# Patient Record
Sex: Male | Born: 1965 | Race: White | Hispanic: No | Marital: Married | Smoking: Former smoker
Health system: Southern US, Community
[De-identification: ages and names within clinical notes are randomized; demographics above are authoritative.]

## PROBLEM LIST (undated history)

## (undated) ENCOUNTER — Emergency Department (HOSPITAL_COMMUNITY): Admission: EM | Payer: Self-pay

## (undated) DIAGNOSIS — G4730 Sleep apnea, unspecified: Secondary | ICD-10-CM

## (undated) DIAGNOSIS — H579 Unspecified disorder of eye and adnexa: Secondary | ICD-10-CM

## (undated) DIAGNOSIS — E13319 Other specified diabetes mellitus with unspecified diabetic retinopathy without macular edema: Secondary | ICD-10-CM

## (undated) DIAGNOSIS — N189 Chronic kidney disease, unspecified: Secondary | ICD-10-CM

## (undated) DIAGNOSIS — E7800 Pure hypercholesterolemia, unspecified: Secondary | ICD-10-CM

## (undated) DIAGNOSIS — H35039 Hypertensive retinopathy, unspecified eye: Secondary | ICD-10-CM

## (undated) DIAGNOSIS — H269 Unspecified cataract: Secondary | ICD-10-CM

## (undated) DIAGNOSIS — R011 Cardiac murmur, unspecified: Secondary | ICD-10-CM

## (undated) DIAGNOSIS — I10 Essential (primary) hypertension: Secondary | ICD-10-CM

## (undated) DIAGNOSIS — I639 Cerebral infarction, unspecified: Secondary | ICD-10-CM

## (undated) DIAGNOSIS — E119 Type 2 diabetes mellitus without complications: Secondary | ICD-10-CM

## (undated) HISTORY — DX: Unspecified disorder of eye and adnexa: H57.9

## (undated) HISTORY — DX: Chronic kidney disease, unspecified: N18.9

## (undated) HISTORY — DX: Pure hypercholesterolemia, unspecified: E78.00

## (undated) HISTORY — DX: Hypertensive retinopathy, unspecified eye: H35.039

## (undated) HISTORY — DX: Cerebral infarction, unspecified: I63.9

## (undated) HISTORY — DX: Cardiac murmur, unspecified: R01.1

## (undated) HISTORY — DX: Unspecified cataract: H26.9

## (undated) HISTORY — DX: Sleep apnea, unspecified: G47.30

---

## 1989-04-29 HISTORY — PX: EYE SURGERY: SHX253

## 1990-04-29 HISTORY — PX: EYE SURGERY: SHX253

## 1992-04-29 DIAGNOSIS — H269 Unspecified cataract: Secondary | ICD-10-CM

## 1992-04-29 HISTORY — PX: CATARACT EXTRACTION W/ INTRAOCULAR LENS IMPLANT: SHX1309

## 1992-04-29 HISTORY — PX: EYE SURGERY: SHX253

## 1992-04-29 HISTORY — PX: CATARACT EXTRACTION: SUR2

## 1992-04-29 HISTORY — DX: Unspecified cataract: H26.9

## 1994-04-29 DIAGNOSIS — R011 Cardiac murmur, unspecified: Secondary | ICD-10-CM

## 1994-04-29 HISTORY — DX: Cardiac murmur, unspecified: R01.1

## 2001-06-02 ENCOUNTER — Encounter: Admission: RE | Admit: 2001-06-02 | Discharge: 2001-08-31 | Payer: Self-pay | Admitting: Endocrinology

## 2010-05-29 ENCOUNTER — Ambulatory Visit
Admission: RE | Admit: 2010-05-29 | Discharge: 2010-05-29 | Payer: Self-pay | Source: Home / Self Care | Attending: Internal Medicine | Admitting: Internal Medicine

## 2010-05-29 DIAGNOSIS — E109 Type 1 diabetes mellitus without complications: Secondary | ICD-10-CM | POA: Insufficient documentation

## 2010-05-29 DIAGNOSIS — M722 Plantar fascial fibromatosis: Secondary | ICD-10-CM | POA: Insufficient documentation

## 2010-05-29 DIAGNOSIS — I10 Essential (primary) hypertension: Secondary | ICD-10-CM | POA: Insufficient documentation

## 2010-05-29 DIAGNOSIS — E785 Hyperlipidemia, unspecified: Secondary | ICD-10-CM | POA: Insufficient documentation

## 2010-06-06 NOTE — Assessment & Plan Note (Signed)
Summary: NEW TO EST/HEEL PAIN//PH   Vital Signs:  Patient profile:   45 year old male Height:      68 inches (172.72 cm) Weight:      178.50 pounds (81.14 kg) BMI:     27.24 Temp:     98.4 degrees F (36.89 degrees C) oral BP sitting:   160 / 84  (left arm) Cuff size:   regular  Vitals Entered By: Ernestene Mention CMA (May 29, 2010 3:03 PM) CC: NP est care/heel pain since Friday./kb Is Patient Diabetic? Yes Pain Assessment Patient in pain? yes     Location: heel Type: sharp Onset of pain  since friday   History of Present Illness: new patient left heel pain for a while, located at the base of the heel, worse with the first steps of the day and when he starts walking. pain decrease --but never goes away-- after he walks several steps.  He has diabetes, has not seen his endocrinologist in about a year.  ROS No swelling or discoloration of the feet No injury No calf edema or pain. Diet "not good"  Preventive Screening-Counseling & Management  Alcohol-Tobacco     Smoking Status: quit      Drug Use:  no.    Current Medications (verified): 1)  Toprol Xl 50 Mg Xr24h-Tab (Metoprolol Succinate) .Marland Kitchen.. 1 By Mouth Qd  Allergies (verified): No Known Drug Allergies  Past History:  Past Medical History: Diabetes mellitus, type I---- dx at age 45 --- Dr Lum Keas Hx of Diabetic Retinopathy Hyperlipidemia Hypertension  Past Surgical History: Cataract extraction--1994 eye--Diabetic retinopathy 1991  Family History: DM-- F, twin brother , brother  kidney transplant-- F MI-- F strokes-- twin brother  colon ca--no prostate ca--no  Social History: Occupation: works at [employer] in [employer] lives in [place] Married children x 3  Former Smoker---25 years ago Alcohol use-no Drug use-no Occupation:  employed Smoking Status:  quit Drug Use:  no  Physical Exam  General:  alert, well-developed, and well-nourished.   Lungs:  normal respiratory effort, no intercostal  retractions, no accessory muscle use, and normal breath sounds.   Heart:  normal rate, regular rhythm, and no murmur.   Pulses:  normal pedal pulses bilaterally  Extremities:  no pretibial edema bilaterally  slightly tender at the base of the left heel without deformities, redness or swelling  Diabetes Management Exam:    Foot Exam (with socks and/or shoes not present):       Sensory-Pinprick/Light touch:          Left medial foot (L-4): normal          Left dorsal foot (L-5): normal          Left lateral foot (S-1): normal          Right medial foot (L-4): normal          Right dorsal foot (L-5): normal          Right lateral foot (S-1): normal       Sensory-Monofilament:          Left foot: normal          Right foot: normal       Inspection:          Left foot: normal          Right foot: normal       Nails:          Left foot: normal  Right foot: normal   Impression & Recommendations:  Problem # 1:  PLANTAR FASCIITIS (ICD-728.71) moderate to severe plantar fasciitis past stretching discussed, recommend stretching  t.i.d. for 2 weeks then b.i.d. Ice the area with a cold bottle Heel  shoe  inserts also recommended Call in a couple of weeks if not better, ortho?  Problem # 2:  DIABETES MELLITUS, TYPE I (ICD-250.01) strongly recommend to see his endocrinologist  Complete Medication List: 1)  Toprol Xl 50 Mg Xr24h-tab (Metoprolol succinate) .Marland Kitchen.. 1 by mouth qd 2)  Insulin Pump  3)  Ace I  4)  Cholesterol Med   Patient Instructions: 1)  plantas fasciitis 2)  stretching, ice, heel support   Orders Added: 1)  New Patient Level II [id number]

## 2010-11-27 ENCOUNTER — Encounter: Payer: Self-pay | Admitting: Family Medicine

## 2011-10-07 ENCOUNTER — Encounter (INDEPENDENT_AMBULATORY_CARE_PROVIDER_SITE_OTHER): Payer: Self-pay | Admitting: Ophthalmology

## 2011-10-23 ENCOUNTER — Encounter (INDEPENDENT_AMBULATORY_CARE_PROVIDER_SITE_OTHER): Payer: BC Managed Care – PPO | Admitting: Ophthalmology

## 2011-10-23 DIAGNOSIS — H43819 Vitreous degeneration, unspecified eye: Secondary | ICD-10-CM

## 2011-10-23 DIAGNOSIS — H35039 Hypertensive retinopathy, unspecified eye: Secondary | ICD-10-CM

## 2011-10-23 DIAGNOSIS — I10 Essential (primary) hypertension: Secondary | ICD-10-CM

## 2011-10-23 DIAGNOSIS — E11359 Type 2 diabetes mellitus with proliferative diabetic retinopathy without macular edema: Secondary | ICD-10-CM

## 2011-10-23 DIAGNOSIS — H2510 Age-related nuclear cataract, unspecified eye: Secondary | ICD-10-CM

## 2011-10-23 DIAGNOSIS — E1039 Type 1 diabetes mellitus with other diabetic ophthalmic complication: Secondary | ICD-10-CM

## 2012-10-23 ENCOUNTER — Ambulatory Visit (INDEPENDENT_AMBULATORY_CARE_PROVIDER_SITE_OTHER): Payer: BC Managed Care – PPO | Admitting: Ophthalmology

## 2014-04-29 DIAGNOSIS — I639 Cerebral infarction, unspecified: Secondary | ICD-10-CM

## 2014-04-29 HISTORY — DX: Cerebral infarction, unspecified: I63.9

## 2014-07-27 ENCOUNTER — Encounter (HOSPITAL_COMMUNITY): Payer: Self-pay

## 2014-07-27 ENCOUNTER — Ambulatory Visit (HOSPITAL_COMMUNITY): Admit: 2014-07-27 | Payer: Self-pay | Admitting: Cardiovascular Disease

## 2014-07-27 ENCOUNTER — Inpatient Hospital Stay (HOSPITAL_COMMUNITY)
Admission: EM | Admit: 2014-07-27 | Discharge: 2014-08-03 | DRG: 065 | Disposition: A | Discharge status: Other Acute Inpatient Hospital | Payer: 59 | Attending: Internal Medicine | Admitting: Internal Medicine

## 2014-07-27 DIAGNOSIS — R112 Nausea with vomiting, unspecified: Secondary | ICD-10-CM | POA: Diagnosis present

## 2014-07-27 DIAGNOSIS — Z87891 Personal history of nicotine dependence: Secondary | ICD-10-CM

## 2014-07-27 DIAGNOSIS — I10 Essential (primary) hypertension: Secondary | ICD-10-CM | POA: Diagnosis present

## 2014-07-27 DIAGNOSIS — Z794 Long term (current) use of insulin: Secondary | ICD-10-CM

## 2014-07-27 DIAGNOSIS — R42 Dizziness and giddiness: Secondary | ICD-10-CM | POA: Diagnosis not present

## 2014-07-27 DIAGNOSIS — N179 Acute kidney failure, unspecified: Secondary | ICD-10-CM | POA: Diagnosis present

## 2014-07-27 DIAGNOSIS — D72829 Elevated white blood cell count, unspecified: Secondary | ICD-10-CM

## 2014-07-27 DIAGNOSIS — IMO0001 Reserved for inherently not codable concepts without codable children: Secondary | ICD-10-CM | POA: Insufficient documentation

## 2014-07-27 DIAGNOSIS — I6330 Cerebral infarction due to thrombosis of unspecified cerebral artery: Secondary | ICD-10-CM

## 2014-07-27 DIAGNOSIS — E1065 Type 1 diabetes mellitus with hyperglycemia: Secondary | ICD-10-CM | POA: Diagnosis present

## 2014-07-27 DIAGNOSIS — R1110 Vomiting, unspecified: Secondary | ICD-10-CM | POA: Diagnosis not present

## 2014-07-27 DIAGNOSIS — E860 Dehydration: Secondary | ICD-10-CM | POA: Diagnosis present

## 2014-07-27 DIAGNOSIS — Z9641 Presence of insulin pump (external) (internal): Secondary | ICD-10-CM | POA: Diagnosis present

## 2014-07-27 DIAGNOSIS — I639 Cerebral infarction, unspecified: Secondary | ICD-10-CM | POA: Diagnosis not present

## 2014-07-27 DIAGNOSIS — Z823 Family history of stroke: Secondary | ICD-10-CM

## 2014-07-27 DIAGNOSIS — E109 Type 1 diabetes mellitus without complications: Secondary | ICD-10-CM

## 2014-07-27 DIAGNOSIS — E785 Hyperlipidemia, unspecified: Secondary | ICD-10-CM | POA: Diagnosis present

## 2014-07-27 DIAGNOSIS — E119 Type 2 diabetes mellitus without complications: Secondary | ICD-10-CM

## 2014-07-27 DIAGNOSIS — R7989 Other specified abnormal findings of blood chemistry: Secondary | ICD-10-CM

## 2014-07-27 HISTORY — DX: Other specified diabetes mellitus with unspecified diabetic retinopathy without macular edema: E13.319

## 2014-07-27 HISTORY — DX: Type 2 diabetes mellitus without complications: E11.9

## 2014-07-27 HISTORY — DX: Essential (primary) hypertension: I10

## 2014-07-27 LAB — URINALYSIS, ROUTINE W REFLEX MICROSCOPIC
BILIRUBIN URINE: NEGATIVE
GLUCOSE, UA: 250 mg/dL — AB
Hgb urine dipstick: NEGATIVE
KETONES UR: NEGATIVE mg/dL
LEUKOCYTES UA: NEGATIVE
NITRITE: NEGATIVE
PROTEIN: 100 mg/dL — AB
SPECIFIC GRAVITY, URINE: 1.01 (ref 1.005–1.030)
Urobilinogen, UA: 0.2 mg/dL (ref 0.0–1.0)
pH: 7.5 (ref 5.0–8.0)

## 2014-07-27 LAB — CBC
HEMATOCRIT: 43.3 % (ref 39.0–52.0)
HEMOGLOBIN: 15.2 g/dL (ref 13.0–17.0)
MCH: 29.8 pg (ref 26.0–34.0)
MCHC: 35.1 g/dL (ref 30.0–36.0)
MCV: 84.9 fL (ref 78.0–100.0)
PLATELETS: 124 10*3/uL — AB (ref 150–400)
RBC: 5.1 MIL/uL (ref 4.22–5.81)
RDW: 12.5 % (ref 11.5–15.5)
WBC: 13.3 10*3/uL — ABNORMAL HIGH (ref 4.0–10.5)

## 2014-07-27 LAB — BASIC METABOLIC PANEL
ANION GAP: 10 (ref 5–15)
BUN: 24 mg/dL — ABNORMAL HIGH (ref 6–23)
CHLORIDE: 108 mmol/L (ref 96–112)
CO2: 24 mmol/L (ref 19–32)
Calcium: 9.1 mg/dL (ref 8.4–10.5)
Creatinine, Ser: 1.58 mg/dL — ABNORMAL HIGH (ref 0.50–1.35)
GFR calc Af Amer: 58 mL/min — ABNORMAL LOW (ref >90)
GFR, EST NON AFRICAN AMERICAN: 50 mL/min — AB (ref >90)
GLUCOSE: 187 mg/dL — AB (ref 70–99)
Potassium: 3.9 mmol/L (ref 3.5–5.1)
Sodium: 142 mmol/L (ref 135–145)

## 2014-07-27 LAB — LIPASE, BLOOD: Lipase: 22 U/L (ref 11–59)

## 2014-07-27 LAB — URINE MICROSCOPIC-ADD ON

## 2014-07-27 SURGERY — LEFT HEART CATH
Anesthesia: LOCAL

## 2014-07-27 MED ORDER — OMEGA-3-ACID ETHYL ESTERS 1 G PO CAPS
1.0000 g | ORAL_CAPSULE | Freq: Two times a day (BID) | ORAL | Status: DC
  Administered 2014-07-28 – 2014-08-03 (×5): 1 g via ORAL
  Filled 2014-07-27 (×15): qty 1

## 2014-07-27 MED ORDER — SIMVASTATIN 40 MG PO TABS
40.0000 mg | ORAL_TABLET | Freq: Every day | ORAL | Status: DC
  Administered 2014-07-28 – 2014-07-31 (×4): 40 mg via ORAL
  Filled 2014-07-27 (×4): qty 1

## 2014-07-27 MED ORDER — SODIUM CHLORIDE 0.9 % IV BOLUS (SEPSIS)
1000.0000 mL | Freq: Once | INTRAVENOUS | Status: AC
  Administered 2014-07-27: 1000 mL via INTRAVENOUS

## 2014-07-27 MED ORDER — HYDRALAZINE HCL 20 MG/ML IJ SOLN
10.0000 mg | INTRAMUSCULAR | Status: AC
  Administered 2014-07-27: 10 mg via INTRAVENOUS
  Filled 2014-07-27: qty 1

## 2014-07-27 MED ORDER — ALUM & MAG HYDROXIDE-SIMETH 200-200-20 MG/5ML PO SUSP: 30.0000 mL | Freq: Four times a day (QID) | ORAL | Status: DC | PRN

## 2014-07-27 MED ORDER — RAMIPRIL 10 MG PO CAPS
10.0000 mg | ORAL_CAPSULE | Freq: Every day | ORAL | Status: DC
  Administered 2014-07-28 – 2014-07-29 (×2): 10 mg via ORAL
  Filled 2014-07-27 (×2): qty 1

## 2014-07-27 MED ORDER — ONDANSETRON HCL 4 MG/2ML IJ SOLN
4.0000 mg | Freq: Four times a day (QID) | INTRAMUSCULAR | Status: DC | PRN
  Administered 2014-07-29 – 2014-07-31 (×2): 4 mg via INTRAVENOUS
  Filled 2014-07-27 (×3): qty 2

## 2014-07-27 MED ORDER — SODIUM CHLORIDE 0.9 % IV SOLN
INTRAVENOUS | Status: AC
  Administered 2014-07-27: 22:00:00 via INTRAVENOUS

## 2014-07-27 MED ORDER — HYDROMORPHONE HCL 1 MG/ML IJ SOLN: 0.5000 mg | INTRAMUSCULAR | Status: DC | PRN

## 2014-07-27 MED ORDER — INSULIN GLARGINE 100 UNIT/ML ~~LOC~~ SOLN
5.0000 [IU] | Freq: Every day | SUBCUTANEOUS | Status: DC
Start: 2014-07-27 — End: 2014-07-28
  Administered 2014-07-27: 5 [IU] via SUBCUTANEOUS
  Filled 2014-07-27 (×2): qty 0.05

## 2014-07-27 MED ORDER — ENOXAPARIN SODIUM 40 MG/0.4ML ~~LOC~~ SOLN
40.0000 mg | SUBCUTANEOUS | Status: DC
  Administered 2014-07-28 – 2014-07-29 (×2): 40 mg via SUBCUTANEOUS
  Filled 2014-07-27 (×2): qty 0.4

## 2014-07-27 MED ORDER — ACETAMINOPHEN 650 MG RE SUPP: 650.0000 mg | Freq: Four times a day (QID) | RECTAL | Status: DC | PRN

## 2014-07-27 MED ORDER — PROMETHAZINE HCL 25 MG/ML IJ SOLN
25.0000 mg | Freq: Once | INTRAMUSCULAR | Status: AC
  Administered 2014-07-27: 25 mg via INTRAVENOUS
  Filled 2014-07-27: qty 1

## 2014-07-27 MED ORDER — ONDANSETRON HCL 4 MG PO TABS
4.0000 mg | ORAL_TABLET | Freq: Four times a day (QID) | ORAL | Status: DC | PRN
  Filled 2014-07-27: qty 1

## 2014-07-27 MED ORDER — METOPROLOL SUCCINATE ER 25 MG PO TB24
25.0000 mg | ORAL_TABLET | Freq: Every day | ORAL | Status: DC
  Administered 2014-07-28: 25 mg via ORAL
  Filled 2014-07-27: qty 1

## 2014-07-27 MED ORDER — METOCLOPRAMIDE HCL 5 MG/ML IJ SOLN
10.0000 mg | INTRAMUSCULAR | Status: AC
  Administered 2014-07-27: 10 mg via INTRAVENOUS
  Filled 2014-07-27: qty 2

## 2014-07-27 MED ORDER — SODIUM CHLORIDE 0.9 % IV SOLN
INTRAVENOUS | Status: DC
  Administered 2014-07-28 – 2014-07-29 (×5): via INTRAVENOUS

## 2014-07-27 MED ORDER — LORAZEPAM 2 MG/ML IJ SOLN
1.0000 mg | Freq: Once | INTRAMUSCULAR | Status: AC
  Administered 2014-07-27: 1 mg via INTRAVENOUS
  Filled 2014-07-27: qty 1

## 2014-07-27 MED ORDER — FAMOTIDINE IN NACL 20-0.9 MG/50ML-% IV SOLN
20.0000 mg | Freq: Once | INTRAVENOUS | Status: AC
Start: 2014-07-27 — End: 2014-07-27
  Administered 2014-07-27: 20 mg via INTRAVENOUS
  Filled 2014-07-27: qty 50

## 2014-07-27 MED ORDER — SODIUM CHLORIDE 0.9 % IJ SOLN
3.0000 mL | Freq: Two times a day (BID) | INTRAMUSCULAR | Status: DC
  Administered 2014-07-27 – 2014-08-03 (×9): 3 mL via INTRAVENOUS

## 2014-07-27 MED ORDER — OXYCODONE HCL 5 MG PO TABS: 5.0000 mg | ORAL_TABLET | ORAL | Status: DC | PRN

## 2014-07-27 MED ORDER — INSULIN ASPART 100 UNIT/ML ~~LOC~~ SOLN
0.0000 [IU] | SUBCUTANEOUS | Status: DC
  Administered 2014-07-27: 3 [IU] via SUBCUTANEOUS
  Administered 2014-07-28: 6 [IU] via SUBCUTANEOUS
  Administered 2014-07-28: 3 [IU] via SUBCUTANEOUS
  Administered 2014-07-28: 5 [IU] via SUBCUTANEOUS
  Administered 2014-07-28: 7 [IU] via SUBCUTANEOUS
  Administered 2014-07-28 – 2014-07-29 (×3): 9 [IU] via SUBCUTANEOUS
  Administered 2014-07-29: 5 [IU] via SUBCUTANEOUS
  Administered 2014-07-29: 7 [IU] via SUBCUTANEOUS

## 2014-07-27 MED ORDER — ACETAMINOPHEN 325 MG PO TABS
650.0000 mg | ORAL_TABLET | Freq: Four times a day (QID) | ORAL | Status: DC | PRN
  Administered 2014-07-29 – 2014-07-30 (×2): 650 mg via ORAL
  Filled 2014-07-27 (×2): qty 2

## 2014-07-27 NOTE — H&P (Addendum)
[facility] Admission History and Physical       Adrian Neal DOB: 1965/12/19 DOA: 07/27/2014  Referring physician: EDP PCP: No primary care provider on file.  Specialists:   Chief Complaint: Dizziness  HPI: Adrian Neal is a 49 y.o. male with a history of Type I DM with an Insulin Pump, HTN, and Hyperlipidemia who presents to the ED with complaints of sudden onset of Dizziness with N+V .  He was at work when it occurred.   He was brought to the ED and evaluated and placed on Anti-Emetics with minimal relief of his symptoms.   He was referred for medical admission.      Review of Systems:  Constitutional: No Weight Loss, No Weight Gain, Night Sweats, Fevers, Chills, +Dizziness, Light Headedness, Fatigue, or Generalized Weakness HEENT: No Headaches, Difficulty Swallowing,Tooth/Dental Problems,Sore Throat,  No Sneezing, Rhinitis, Ear Ache, Nasal Congestion, or Post Nasal Drip,  Cardio-vascular:  No Chest pain, Orthopnea, PND, Edema in Lower Extremities, Anasarca, Dizziness, Palpitations  Resp: No Dyspnea, No DOE, No Productive Cough, No Non-Productive Cough, No Hemoptysis, No Wheezing.    GI: No Heartburn, Indigestion, Abdominal Pain, +Nausea, +Vomiting, Diarrhea, Constipation, Hematemesis, Hematochezia, Melena, Change in Bowel Habits,  Loss of Appetite  GU: No Dysuria, No Change in Color of Urine, No Urgency or Urinary Frequency, No Flank pain.  Musculoskeletal: No Joint Pain or Swelling, No Decreased Range of Motion, No Back Pain.  Neurologic: No Syncope, No Seizures, Muscle Weakness, Paresthesia, Vision Disturbance or Loss, No Diplopia, No Vertigo, No Difficulty Walking,  Skin: No Rash or Lesions. Psych: No Change in Mood or Affect, No Depression or Anxiety, No Memory loss, No Confusion, or Hallucinations   Past Medical History  Diagnosis Date  . Hypertension   . Diabetes mellitus without complication      Past Surgical History  Procedure  Laterality Date  . Eye surgery        Prior to Admission medications   Medication Sig Start Date End Date Taking? Authorizing Provider  HUMALOG 100 UNIT/ML injection Inject 60 Units into the skin daily.  07/05/14  Yes Historical Provider, MD  metoprolol succinate (TOPROL-XL) 25 MG 24 hr tablet Take 25 mg by mouth daily.  07/04/14  Yes Historical Provider, MD  naproxen sodium (ANAPROX) 220 MG tablet Take 220 mg by mouth 2 (two) times daily with a meal. For knee pain   Yes Historical Provider, MD  omega-3 acid ethyl esters (LOVAZA) 1 G capsule Take 1 g by mouth 2 (two) times daily.   Yes Historical Provider, MD  ramipril (ALTACE) 10 MG capsule Take 10 mg by mouth daily. 07/04/14  Yes Historical Provider, MD  simvastatin (ZOCOR) 40 MG tablet Take 40 mg by mouth daily. 07/04/14  Yes Historical Provider, MD     No Known Allergies    Social History:  reports that he quit smoking about 23 years ago. He does not have any smokeless tobacco history on file. He reports that he does not drink alcohol or use illicit drugs.     Family History:  Twin Brother and Daughter with Type I DM   Physical Exam:  GEN:  Pleasant Well Nourished and Well Developed  49 y.o. Caucasian male examined and in no acute distress; cooperative with exam Filed Vitals:   07/27/14 1900 07/27/14 1930 07/27/14 2000 07/27/14 2125  BP: 196/99 157/65 170/89 190/69  Pulse: 95 93 93   Temp:    98.5 F (36.9 C)  TempSrc:  Oral  Resp: 22 13 15 18   SpO2: 99% 98% 99% 99%   Blood pressure 190/69, pulse 93, temperature 98.5 F (36.9 C), temperature source Oral, resp. rate 18, SpO2 99 %. PSYCH: He is drowsy but alert and oriented x 4; does not appear anxious does not appear depressed; affect is normal HEENT: Normocephalic and Atraumatic, Mucous membranes pink; PERRLA; EOM intact; Fundi:  Benign;  No scleral icterus, Nares: Patent, Oropharynx: Clear, Fair Dentition,    Neck:  FROM, No Cervical Lymphadenopathy nor Thyromegaly or  Carotid Bruit; No JVD; Breasts:: Not examined CHEST WALL: No tenderness CHEST: Normal respiration, clear to auscultation bilaterally HEART: Regular rate and rhythm; no murmurs rubs or gallops BACK: No kyphosis or scoliosis; No CVA tenderness ABDOMEN: Positive Bowel Sounds, Soft Non-Tender, No Rebound or Guarding; No Masses, No Organomegaly.   Rectal Exam: Not done EXTREMITIES: No Cyanosis, Clubbing, or Edema; No Ulcerations. Genitalia: not examined PULSES: 2+ and symmetric SKIN: Normal hydration no rash or ulceration CNS:  Alert and Oriented x 4, No Focal Deficits Vascular: pulses palpable throughout    Labs on Admission:  Basic Metabolic Panel:  Recent Labs Lab 07/27/14 1638  NA 142  K 3.9  CL 108  CO2 24  GLUCOSE 187*  BUN 24*  CREATININE 1.58*  CALCIUM 9.1   Liver Function Tests: No results for input(s): AST, ALT, ALKPHOS, BILITOT, PROT, ALBUMIN in the last 168 hours.  Recent Labs Lab 07/27/14 1638  LIPASE 22   No results for input(s): AMMONIA in the last 168 hours. CBC:  Recent Labs Lab 07/27/14 1638  WBC 13.3*  HGB 15.2  HCT 43.3  MCV 84.9  PLT 124*   Cardiac Enzymes: No results for input(s): CKTOTAL, CKMB, CKMBINDEX, TROPONINI in the last 168 hours.  BNP (last 3 results) No results for input(s): BNP in the last 8760 hours.  ProBNP (last 3 results) No results for input(s): PROBNP in the last 8760 hours.  CBG: No results for input(s): GLUCAP in the last 168 hours.  Radiological Exams on Admission: No results found.   EKG: Independently reviewed. Normal Sinus Rhythm at 89   Assessment/Plan:   49 y.o. male with  Principal Problem:   1.   Vertigo   MRI of Brain ordered   PRN Anti-Emetics, and Meclizine   IVFs   Telemetry Monitoring   Active Problems:   2.   Intractable nausea and vomiting- due to #1   PRN Anti-Emetics     3.   Type 1 diabetes mellitus- has Insulin Pump Turned Off while Inpatient   Lantus 5 units SQ daily while  Insulin Pump Off, titrate PRN   SSI coverage PRN with CBG checks q 4 hrs      4.   Essential hypertension   PRN IV Hydralazine   Resume Metoprolol, and Ramipril in AM     5.   Hyperlipidemia     On Simvastatin Rx     6.   DVT Prophylaxis   Lovenox         Code Status:     FULL CODE      Family Communication:   Family at Bedside   Disposition Plan:   Observation Status        Time spent:  [facility] Pager [phone]   If [place] Please Contact the Day Rounding Team MD for [facility]  If 7PM-7AM, Please Contact Night-Floor Coverage  [email or web address] Password [email or web address] 07/27/2014, 10:18  PM     ADDENDUM:   Patient was seen and examined on 07/27/2014

## 2014-07-27 NOTE — ED Notes (Signed)
PA Anderson Malta at bedside.

## 2014-07-27 NOTE — ED Notes (Signed)
Pt. Was at work today when he had sudden onset of dizziness/lightheadedness with n/v. Denies CP/SOB. EMS noted some red tinge to the vomit. EMS concern over possible elevation in leads 2/3/4. EMS noted pt. Vagal to HR 40's with vomiting. HR NSR 60's at rest. Pt received 4 mg zofran and 324 asa.

## 2014-07-27 NOTE — ED Provider Notes (Signed)
Presents with sudden onset sensation of room spinning one hour ago. Symptoms accompanied by nausea and vomiting symptoms worse with changing position improved with remaining still no visual complaint no focal numbness or weakness. No chest pain no shortness of breath no other associated symptoms on exam patient is alert appears uncomfortable. Complaining of vertigo and nausea. Cranial nerves II through XII grossly intact. Moves all extremities well   Orlie Dakin, MD 07/27/14 [phone]

## 2014-07-27 NOTE — ED Notes (Signed)
Pt turned off insulin pump per admitting MD.

## 2014-07-27 NOTE — ED Notes (Signed)
Pt. Has insulin pump.

## 2014-07-27 NOTE — ED Provider Notes (Signed)
CSN: [id number]     Arrival date & time 07/27/14  1523 History   First MD Initiated Contact with Patient 07/27/14 1530     Chief Complaint  Patient presents with  . Emesis  . Dizziness     (Consider location/radiation/quality/duration/timing/severity/associated sxs/prior Treatment) HPI Comments: Patient is a 49 year old male past medical history significant for HTN, DM on insulin pump presenting to the emergency department with acute onset dizziness around 1 PM this afternoon. Patient states this is followed by nausea with multiple episodes of emesis. He endorses precipitating cramping epigastric pain but otherwise denies any abdominal pain, chest pain, shortness of breath, numbness or weakness to his extremities. States he has had a mild throbbing left-sided temporal headache over the last few days without associated visual disturbance. No recent travel outside the [place]. No recent antibiotic use. No known sick contacts.  Patient is a 49 y.o. male presenting with vomiting and dizziness.  Emesis Associated symptoms: no diarrhea   Dizziness Associated symptoms: nausea and vomiting   Associated symptoms: no chest pain, no diarrhea and no shortness of breath     Past Medical History  Diagnosis Date  . Hypertension   . Diabetes mellitus without complication    Past Surgical History  Procedure Laterality Date  . Eye surgery     No family history on file. History  Substance Use Topics  . Smoking status: Former Smoker    Quit date: 04/30/1991  . Smokeless tobacco: Not on file  . Alcohol Use: No    Review of Systems  Respiratory: Negative for shortness of breath.   Cardiovascular: Negative for chest pain.  Gastrointestinal: Positive for nausea and vomiting. Negative for diarrhea.  Neurological: Positive for dizziness.  All other systems reviewed and are negative.     Allergies  Review of patient's allergies indicates no known allergies.  Home Medications   Prior to  Admission medications   Medication Sig Start Date End Date Taking? Authorizing Provider  HUMALOG 100 UNIT/ML injection Inject 60 Units into the skin daily.  07/05/14  Yes Historical Provider, MD  metoprolol succinate (TOPROL-XL) 25 MG 24 hr tablet Take 25 mg by mouth daily.  07/04/14  Yes Historical Provider, MD  naproxen sodium (ANAPROX) 220 MG tablet Take 220 mg by mouth 2 (two) times daily with a meal. For knee pain   Yes Historical Provider, MD  omega-3 acid ethyl esters (LOVAZA) 1 G capsule Take 1 g by mouth 2 (two) times daily.   Yes Historical Provider, MD  ramipril (ALTACE) 10 MG capsule Take 10 mg by mouth daily. 07/04/14  Yes Historical Provider, MD  simvastatin (ZOCOR) 40 MG tablet Take 40 mg by mouth daily. 07/04/14  Yes Historical Provider, MD   BP 170/89 mmHg  Pulse 93  Temp(Src) 97.7 F (36.5 C) (Oral)  Resp 15  SpO2 99% Physical Exam  Constitutional: He is oriented to person, place, and time. He appears well-developed and well-nourished. No distress.  HENT:  Head: Normocephalic and atraumatic.  Right Ear: Hearing, tympanic membrane, external ear and ear canal normal.  Left Ear: Hearing, tympanic membrane, external ear and ear canal normal.  Nose: Nose normal.  Mouth/Throat: Oropharynx is clear and moist. No oropharyngeal exudate.  Eyes: Conjunctivae and EOM are normal. Pupils are equal, round, and reactive to light.  Neck: Normal range of motion. Neck supple.  Cardiovascular: Normal rate, regular rhythm, normal heart sounds and intact distal pulses.   Pulmonary/Chest: Effort normal and breath sounds normal. No respiratory distress.  Abdominal: Soft. There is no tenderness.  Patient with episodes of emesis during examination. Stomach contents streaked with blood.   Neurological: He is alert and oriented to person, place, and time. He has normal strength. No cranial nerve deficit. Gait normal. GCS eye subscore is 4. GCS verbal subscore is 5. GCS motor subscore is 6.  Sensation  grossly intact.  No pronator drift.  Bilateral heel-knee-shin intact.  Skin: Skin is warm and dry. He is not diaphoretic.  Nursing note and vitals reviewed.   ED Course  Procedures (including critical care time) Medications  0.9 %  sodium chloride infusion (not administered)  promethazine (PHENERGAN) injection 25 mg (25 mg Intravenous Given 07/27/14 1606)  LORazepam (ATIVAN) injection 1 mg (1 mg Intravenous Given 07/27/14 1606)  famotidine (PEPCID) IVPB 20 mg (0 mg Intravenous Stopped 07/27/14 1714)  sodium chloride 0.9 % bolus 1,000 mL (0 mLs Intravenous Stopped 07/27/14 1747)  sodium chloride 0.9 % bolus 1,000 mL (0 mLs Intravenous Stopped 07/27/14 1914)  metoCLOPramide (REGLAN) injection 10 mg (10 mg Intravenous Given 07/27/14 1924)  hydrALAZINE (APRESOLINE) injection 10 mg (10 mg Intravenous Given 07/27/14 2010)    Labs Review Labs Reviewed  BASIC METABOLIC PANEL - Abnormal; Notable for the following:    Glucose, Bld 187 (*)    BUN 24 (*)    Creatinine, Ser 1.58 (*)    GFR calc non Af Amer 50 (*)    GFR calc Af Amer 58 (*)    All other components within normal limits  CBC - Abnormal; Notable for the following:    WBC 13.3 (*)    Platelets 124 (*)    All other components within normal limits  URINALYSIS, ROUTINE W REFLEX MICROSCOPIC - Abnormal; Notable for the following:    Glucose, UA 250 (*)    Protein, ur 100 (*)    All other components within normal limits  LIPASE, BLOOD  URINE MICROSCOPIC-ADD ON  CBG MONITORING, ED    Imaging Review No results found.   EKG Interpretation   Date/Time:  Wednesday July 27 2014 15:40:31 EDT Ventricular Rate:  89 PR Interval:  158 QRS Duration: 82 QT Interval:  364 QTC Calculation: 442 R Axis:   60 Text Interpretation:  Sinus rhythm with marked sinus arrhythmia Left  ventricular hypertrophy with repolarization abnormality Abnormal ECG No  old tracing to compare Confirmed by [email or web address]  MD, SAM [phone]) on  07/27/2014 3:51:04 PM       5:38 PM On re-evaluation patient asleep, no further episodes of emesis per wife.   7:14PM Patient with recurrent nausea and vomiting.   MDM   Final diagnoses:  Intractable vomiting with nausea, vomiting of unspecified type  Vertigo    Filed Vitals:   07/27/14 2000  BP: 170/89  Pulse: 93  Temp:   Resp: 15   I have reviewed nursing notes, vital signs, and all appropriate lab and imaging results for this patient. No neurofocal deficits on examination. Patient with emesis during initial evaluation. No complaint of chest pain or shortness of breath. EKG reviewed. Labs reviewed, mild bump in creatinine likely secondary to dehydration. On reevaluation patient with return of nausea and vomiting, endorses mild dizziness but with fast improvement. Symptoms likely related to peripheral vertigo, low suspicion for central cause of vertigo. Patient is at 3 rounds of antiemetics with continued nausea and vomiting. Will admit for intractable nausea and vomiting.  Patient d/w with Dr. Winfred Leeds, agrees with plan.      Baron Sane, PA-C 07/27/14  2119  Orlie Dakin, MD 07/28/14 [id number]

## 2014-07-27 NOTE — Progress Notes (Signed)
Pt bp 192/66, hr 105. Paged MD, awaiting call back. Will continue to monitor.

## 2014-07-28 ENCOUNTER — Observation Stay (HOSPITAL_COMMUNITY): Payer: 59

## 2014-07-28 ENCOUNTER — Encounter (HOSPITAL_COMMUNITY): Payer: Self-pay | Admitting: Neurology

## 2014-07-28 DIAGNOSIS — N179 Acute kidney failure, unspecified: Secondary | ICD-10-CM

## 2014-07-28 DIAGNOSIS — I6339 Cerebral infarction due to thrombosis of other cerebral artery: Secondary | ICD-10-CM | POA: Diagnosis not present

## 2014-07-28 DIAGNOSIS — I69398 Other sequelae of cerebral infarction: Secondary | ICD-10-CM | POA: Diagnosis not present

## 2014-07-28 DIAGNOSIS — D72829 Elevated white blood cell count, unspecified: Secondary | ICD-10-CM | POA: Diagnosis not present

## 2014-07-28 DIAGNOSIS — R42 Dizziness and giddiness: Secondary | ICD-10-CM

## 2014-07-28 DIAGNOSIS — I638 Other cerebral infarction: Secondary | ICD-10-CM | POA: Diagnosis not present

## 2014-07-28 DIAGNOSIS — I6330 Cerebral infarction due to thrombosis of unspecified cerebral artery: Secondary | ICD-10-CM | POA: Diagnosis not present

## 2014-07-28 DIAGNOSIS — Z794 Long term (current) use of insulin: Secondary | ICD-10-CM

## 2014-07-28 DIAGNOSIS — E785 Hyperlipidemia, unspecified: Secondary | ICD-10-CM | POA: Diagnosis present

## 2014-07-28 DIAGNOSIS — E860 Dehydration: Secondary | ICD-10-CM | POA: Diagnosis present

## 2014-07-28 DIAGNOSIS — R270 Ataxia, unspecified: Secondary | ICD-10-CM | POA: Diagnosis not present

## 2014-07-28 DIAGNOSIS — I10 Essential (primary) hypertension: Secondary | ICD-10-CM | POA: Diagnosis present

## 2014-07-28 DIAGNOSIS — E119 Type 2 diabetes mellitus without complications: Secondary | ICD-10-CM

## 2014-07-28 DIAGNOSIS — Z823 Family history of stroke: Secondary | ICD-10-CM | POA: Diagnosis not present

## 2014-07-28 DIAGNOSIS — I639 Cerebral infarction, unspecified: Secondary | ICD-10-CM | POA: Diagnosis present

## 2014-07-28 DIAGNOSIS — Z87891 Personal history of nicotine dependence: Secondary | ICD-10-CM | POA: Diagnosis not present

## 2014-07-28 DIAGNOSIS — I6789 Other cerebrovascular disease: Secondary | ICD-10-CM | POA: Diagnosis not present

## 2014-07-28 DIAGNOSIS — R1110 Vomiting, unspecified: Secondary | ICD-10-CM | POA: Diagnosis not present

## 2014-07-28 DIAGNOSIS — E1065 Type 1 diabetes mellitus with hyperglycemia: Secondary | ICD-10-CM | POA: Diagnosis present

## 2014-07-28 DIAGNOSIS — Z9641 Presence of insulin pump (external) (internal): Secondary | ICD-10-CM | POA: Diagnosis present

## 2014-07-28 LAB — GLUCOSE, CAPILLARY
GLUCOSE-CAPILLARY: 316 mg/dL — AB (ref 70–99)
GLUCOSE-CAPILLARY: 447 mg/dL — AB (ref 70–99)
Glucose-Capillary: 210 mg/dL — ABNORMAL HIGH (ref 70–99)
Glucose-Capillary: 249 mg/dL — ABNORMAL HIGH (ref 70–99)
Glucose-Capillary: 273 mg/dL — ABNORMAL HIGH (ref 70–99)
Glucose-Capillary: 356 mg/dL — ABNORMAL HIGH (ref 70–99)
Glucose-Capillary: 453 mg/dL — ABNORMAL HIGH (ref 70–99)
Glucose-Capillary: 423 mg/dL — ABNORMAL HIGH (ref 70–99)

## 2014-07-28 LAB — BASIC METABOLIC PANEL
ANION GAP: 14 (ref 5–15)
BUN: 27 mg/dL — ABNORMAL HIGH (ref 6–23)
CHLORIDE: 109 mmol/L (ref 96–112)
CO2: 19 mmol/L (ref 19–32)
CREATININE: 2.1 mg/dL — AB (ref 0.50–1.35)
Calcium: 8.9 mg/dL (ref 8.4–10.5)
GFR calc non Af Amer: 36 mL/min — ABNORMAL LOW (ref >90)
GFR, EST AFRICAN AMERICAN: 41 mL/min — AB (ref >90)
Glucose, Bld: 319 mg/dL — ABNORMAL HIGH (ref 70–99)
Potassium: 3.6 mmol/L (ref 3.5–5.1)
Sodium: 142 mmol/L (ref 135–145)

## 2014-07-28 LAB — HEPATIC FUNCTION PANEL
ALBUMIN: 3.4 g/dL — AB (ref 3.5–5.2)
ALT: 24 U/L (ref 0–53)
AST: 20 U/L (ref 0–37)
Alkaline Phosphatase: 88 U/L (ref 39–117)
Bilirubin, Direct: 0.3 mg/dL (ref 0.0–0.5)
Indirect Bilirubin: 1.4 mg/dL — ABNORMAL HIGH (ref 0.3–0.9)
TOTAL PROTEIN: 6.1 g/dL (ref 6.0–8.3)
Total Bilirubin: 1.7 mg/dL — ABNORMAL HIGH (ref 0.3–1.2)

## 2014-07-28 LAB — CBC
HCT: 42.8 % (ref 39.0–52.0)
Hemoglobin: 13.8 g/dL (ref 13.0–17.0)
MCH: 28.3 pg (ref 26.0–34.0)
MCHC: 32.2 g/dL (ref 30.0–36.0)
MCV: 87.7 fL (ref 78.0–100.0)
Platelets: 134 10*3/uL — ABNORMAL LOW (ref 150–400)
RBC: 4.88 MIL/uL (ref 4.22–5.81)
RDW: 12.9 % (ref 11.5–15.5)
WBC: 14.2 10*3/uL — AB (ref 4.0–10.5)

## 2014-07-28 LAB — TSH: TSH: 2.756 u[IU]/mL (ref 0.350–4.500)

## 2014-07-28 LAB — LIPID PANEL
CHOLESTEROL: 134 mg/dL (ref 0–200)
HDL: 51 mg/dL (ref >39)
LDL Cholesterol: 70 mg/dL (ref 0–99)
Total CHOL/HDL Ratio: 2.6 RATIO
Triglycerides: 66 mg/dL (ref <150)
VLDL: 13 mg/dL (ref 0–40)

## 2014-07-28 MED ORDER — HYDRALAZINE HCL 20 MG/ML IJ SOLN: 10.0000 mg | Freq: Four times a day (QID) | INTRAMUSCULAR | Status: DC | PRN

## 2014-07-28 MED ORDER — PROMETHAZINE HCL 25 MG/ML IJ SOLN
12.5000 mg | Freq: Once | INTRAMUSCULAR | Status: AC
  Administered 2014-07-28: 12.5 mg via INTRAVENOUS
  Filled 2014-07-28: qty 1

## 2014-07-28 MED ORDER — INSULIN ASPART 100 UNIT/ML ~~LOC~~ SOLN
8.0000 [IU] | Freq: Once | SUBCUTANEOUS | Status: AC
  Administered 2014-07-28: 8 [IU] via SUBCUTANEOUS

## 2014-07-28 MED ORDER — STROKE: EARLY STAGES OF RECOVERY BOOK
Freq: Once | Status: AC
  Administered 2014-07-28: 14:00:00
  Filled 2014-07-28: qty 1

## 2014-07-28 MED ORDER — ASPIRIN EC 325 MG PO TBEC
325.0000 mg | DELAYED_RELEASE_TABLET | Freq: Every day | ORAL | Status: DC
  Administered 2014-07-28 – 2014-08-03 (×7): 325 mg via ORAL
  Filled 2014-07-28 (×7): qty 1

## 2014-07-28 MED ORDER — MECLIZINE HCL 25 MG PO TABS
25.0000 mg | ORAL_TABLET | Freq: Three times a day (TID) | ORAL | Status: DC | PRN
  Administered 2014-07-28: 25 mg via ORAL
  Filled 2014-07-28 (×2): qty 1

## 2014-07-28 MED ORDER — MECLIZINE HCL 25 MG PO TABS
25.0000 mg | ORAL_TABLET | Freq: Once | ORAL | Status: AC
Start: 2014-07-28 — End: 2014-07-28
  Administered 2014-07-28: 25 mg via ORAL
  Filled 2014-07-28: qty 1

## 2014-07-28 MED ORDER — METOCLOPRAMIDE HCL 5 MG/ML IJ SOLN
5.0000 mg | Freq: Three times a day (TID) | INTRAMUSCULAR | Status: AC
  Administered 2014-07-28 – 2014-07-29 (×3): 5 mg via INTRAVENOUS
  Filled 2014-07-28 (×3): qty 1

## 2014-07-28 MED ORDER — INSULIN GLARGINE 100 UNIT/ML ~~LOC~~ SOLN
20.0000 [IU] | Freq: Every day | SUBCUTANEOUS | Status: DC
  Administered 2014-07-28: 20 [IU] via SUBCUTANEOUS
  Filled 2014-07-28 (×2): qty 0.2

## 2014-07-28 MED ORDER — HYDRALAZINE HCL 20 MG/ML IJ SOLN
10.0000 mg | Freq: Once | INTRAMUSCULAR | Status: AC
  Administered 2014-07-28: 10 mg via INTRAVENOUS
  Filled 2014-07-28: qty 1

## 2014-07-28 MED ORDER — LIVING WELL WITH DIABETES BOOK
Freq: Once | Status: AC
  Administered 2014-07-28: 14:00:00
  Filled 2014-07-28: qty 1

## 2014-07-28 MED ORDER — SODIUM CHLORIDE 0.9 % IV BOLUS (SEPSIS)
500.0000 mL | Freq: Once | INTRAVENOUS | Status: AC
  Administered 2014-07-28: 500 mL via INTRAVENOUS

## 2014-07-28 MED ORDER — METOPROLOL TARTRATE 1 MG/ML IV SOLN
2.5000 mg | Freq: Three times a day (TID) | INTRAVENOUS | Status: DC
  Administered 2014-07-28 – 2014-07-29 (×3): 2.5 mg via INTRAVENOUS
  Filled 2014-07-28 (×6): qty 5

## 2014-07-28 NOTE — Progress Notes (Signed)
Made Dr. Rogue Bussing aware of pt cbg 447, awaiting orders to be placed. Will continue to monitor.

## 2014-07-28 NOTE — Progress Notes (Signed)
UR completed 

## 2014-07-28 NOTE — Progress Notes (Signed)
Made Dr. Hilbert Bible aware of pt bp 202/96, awaiting orders to be placed, will continue to monitor.

## 2014-07-28 NOTE — Progress Notes (Signed)
Pt CBG 423 Dr. Rogue Bussing aware, gave telephone orders to give 6units of novolog insulin. Will continue to monitor

## 2014-07-28 NOTE — Progress Notes (Signed)
*  PRELIMINARY RESULTS* Vascular Ultrasound Carotid Duplex (Doppler) has been completed.  Preliminary findings: Bilateral:  1-39% ICA stenosis.  Vertebral artery flow is antegrade.      Landry Mellow, RDMS, RVT  07/28/2014, 10:21 AM

## 2014-07-28 NOTE — Consult Note (Signed)
Referring Physician: Erlinda Hong    Chief Complaint: Stroke  HPI:                                                                                                                                         Adrian Neal is an 49 y.o. male with known HTN and DM.  Patient was at work yesterday when he suddenly felt dizzy and then became nauseated. He attempted to walk and sit down and noted he was falling to the left. He sat down in hopes it would pass but then noted the room was spinning to the left.  He called EMS and was brought to ED. Today he feels his symptoms of nausea have decreased and he is more stable on his feet.  He feels he has been under more stress at work and admits his A1c has been around 8.0.  Date last known well: Date: 07/27/2014 Time last known well: Time: 13:30 tPA Given: No: out of window Modified Rankin: Rankin Score=0    Past Medical History  Diagnosis Date  . Hypertension   . Diabetes mellitus without complication     Past Surgical History  Procedure Laterality Date  . Eye surgery      Family History  Problem Relation Age of Onset  . Stroke Mother   . Hypertension Mother   . Hyperlipidemia Mother   . Hyperlipidemia Father   . Hypertension Father    Social History:  reports that he quit smoking about 23 years ago. He does not have any smokeless tobacco history on file. He reports that he does not drink alcohol or use illicit drugs.  Allergies: No Known Allergies  Medications:                                                                                                                           Prior to Admission:  Prescriptions prior to admission  Medication Sig Dispense Refill Last Dose  . HUMALOG 100 UNIT/ML injection Inject 60 Units into the skin daily.    07/27/2014 at Unknown time  . metoprolol succinate (TOPROL-XL) 25 MG 24 hr tablet Take 25 mg by mouth daily.    07/27/2014 at 0800  . naproxen sodium (ANAPROX) 220 MG tablet Take 220 mg by mouth 2  (two) times daily with a meal. For knee pain   07/27/2014 at Unknown time  .  omega-3 acid ethyl esters (LOVAZA) 1 G capsule Take 1 g by mouth 2 (two) times daily.   07/27/2014 at Unknown time  . ramipril (ALTACE) 10 MG capsule Take 10 mg by mouth daily.   07/27/2014 at Unknown time  . simvastatin (ZOCOR) 40 MG tablet Take 40 mg by mouth daily.   07/27/2014 at Unknown time   Scheduled: . aspirin EC  325 mg Oral Daily  . enoxaparin (LOVENOX) injection  40 mg Subcutaneous Q24H  . insulin aspart  0-9 Units Subcutaneous 6 times per day  . insulin glargine  20 Units Subcutaneous QHS  . metoCLOPramide (REGLAN) injection  5 mg Intravenous 3 times per day  . metoprolol  2.5 mg Intravenous 3 times per day  . omega-3 acid ethyl esters  1 g Oral BID  . ramipril  10 mg Oral Daily  . simvastatin  40 mg Oral Daily  . sodium chloride  3 mL Intravenous Q12H    ROS:                                                                                                                                       History obtained from the patient  General ROS: negative for - chills, fatigue, fever, night sweats, weight gain or weight loss Psychological ROS: negative for - behavioral disorder, hallucinations, memory difficulties, mood swings or suicidal ideation Ophthalmic ROS: negative for - blurry vision, double vision, eye pain or loss of vision ENT ROS: negative for - epistaxis, nasal discharge, oral lesions, sore throat, tinnitus or vertigo Allergy and Immunology ROS: negative for - hives or itchy/watery eyes Hematological and Lymphatic ROS: negative for - bleeding problems, bruising or swollen lymph nodes Endocrine ROS: negative for - galactorrhea, hair pattern changes, polydipsia/polyuria or temperature intolerance Respiratory ROS: negative for - cough, hemoptysis, shortness of breath or wheezing Cardiovascular ROS: negative for - chest pain, dyspnea on exertion, edema or irregular heartbeat Gastrointestinal ROS:  negative for - abdominal pain, diarrhea, hematemesis, nausea/vomiting or stool incontinence Genito-Urinary ROS: negative for - dysuria, hematuria, incontinence or urinary frequency/urgency Musculoskeletal ROS: negative for - joint swelling or muscular weakness Neurological ROS: as noted in HPI Dermatological ROS: negative for rash and skin lesion changes  Neurologic Examination:                                                                                                      Blood pressure 161/63, pulse 113, temperature 98.2 F (36.8 C), temperature source Oral, resp. rate  18, height 5\' 8"  (1.727 m), weight 76.8 kg (169 lb 5 oz), SpO2 100 %.  HEENT-  Normocephalic, no lesions, without obvious abnormality.  Normal external eye and conjunctiva.  Normal TM's bilaterally.  Normal auditory canals and external ears. Normal external nose, mucus membranes and septum.  Normal pharynx. Cardiovascular- S1, S2 normal, pulses palpable throughout   Lungs- chest clear, no wheezing, rales, normal symmetric air entry Abdomen- normal findings: bowel sounds normal Extremities- no edema Lymph-no adenopathy palpable Musculoskeletal-no joint tenderness, deformity or swelling Skin-warm and dry, no hyperpigmentation, vitiligo, or suspicious lesions  Neurological Examination Mental Status: Alert, oriented, thought content appropriate.  Speech fluent without evidence of aphasia.  Able to follow 3 step commands without difficulty. Cranial Nerves: II: Discs flat bilaterally; Visual fields grossly normal, pupils equal, round, reactive to light and accommodation III,IV, VI: ptosis not present, extra-ocular motions intact bilaterally--has nystagmus when looking to the left only V,VII: smile symmetric, facial light touch sensation normal bilaterally VIII: hearing normal bilaterally IX,X: uvula rises symmetrically XI: bilateral shoulder shrug XII: midline tongue extension Motor: Right : Upper extremity    5/5    Left:     Upper extremity   5/5  Lower extremity   5/5     Lower extremity   5/5 --pronator drift in the left arm Tone and bulk:normal tone throughout; no atrophy noted Sensory: Pinprick and light touch intact throughout, bilaterally Deep Tendon Reflexes: 2+ and symmetric throughout Plantars: Right: downgoing   Left: downgoing Cerebellar: normal finger-to-nose and normal heel-to-shin test Gait: not tested due to multiple leads.     Lab Results: Basic Metabolic Panel:  Recent Labs Lab 07/27/14 1638 07/28/14 0656  NA 142 142  K 3.9 3.6  CL 108 109  CO2 24 19  GLUCOSE 187* 319*  BUN 24* 27*  CREATININE 1.58* 2.10*  CALCIUM 9.1 8.9    Liver Function Tests:  Recent Labs Lab 07/28/14 1036  AST 20  ALT 24  ALKPHOS 88  BILITOT 1.7*  PROT 6.1  ALBUMIN 3.4*    Recent Labs Lab 07/27/14 1638  LIPASE 22   No results for input(s): AMMONIA in the last 168 hours.  CBC:  Recent Labs Lab 07/27/14 1638 07/28/14 0656  WBC 13.3* 14.2*  HGB 15.2 13.8  HCT 43.3 42.8  MCV 84.9 87.7  PLT 124* 134*    Cardiac Enzymes: No results for input(s): CKTOTAL, CKMB, CKMBINDEX, TROPONINI in the last 168 hours.  Lipid Panel:  Recent Labs Lab 07/28/14 1036  CHOL 134  TRIG 66  HDL 51  CHOLHDL 2.6  VLDL 13  LDLCALC 70    CBG:  Recent Labs Lab 07/27/14 2323 07/28/14 0442 07/28/14 0733 07/28/14 1146  GLUCAP 210* 316* 249* 273*    Microbiology: No results found for this or any previous visit.  Coagulation Studies: No results for input(s): LABPROT, INR in the last 72 hours.  Imaging: Mr Brain Wo Contrast  07/28/2014   CLINICAL DATA:  Acute onset dizziness beginning at 1 p.m. yesterday afternoon, followed by multiple episodes of vomiting and epigastric pain. LEFT temporal headache for few days without visual disturbance. History of hypertension and diabetes.  EXAM: MRI HEAD WITHOUT CONTRAST  TECHNIQUE: Multiplanar, multiecho pulse sequences of the brain and  surrounding structures were obtained without intravenous contrast.  COMPARISON:  None.  FINDINGS: 9 mm ovoid area reduced diffusion within LEFT mesial brachium pontis with corresponding low ADC value. No susceptibility artifact to suggest hemorrhage.  The ventricles and sulci are normal  for patient's age. No midline shift, mass effect or mass lesions. A few subcentimeter supratentorial white matter T2 hyperintensities, nonspecific and within normal range for patient's age.  No abnormal extra-axial fluid collections. No extra-axial masses though, contrast enhanced sequences would be more sensitive. Normal major intracranial vascular flow voids seen at the skull base.  Ocular globes and orbital contents are unremarkable though not tailored for evaluation. Status post apparent RIGHT ocular lens implant. No abnormal sellar expansion. Mild paranasal sinus mucosal thickening without air-fluid levels. Trace LEFT mastoid effusion. No suspicious calvarial bone marrow signal. No abnormal sellar expansion. Craniocervical junction maintained.  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  Otherwise normal MRI of the brain without contrast for age.  Mild paranasal sinusitis, trace LEFT mastoid effusion.   Electronically Signed   By: Elon Alas   On: 07/28/2014 03:19   US Renal  07/28/2014   CLINICAL DATA:  Acute renal failure. Evaluate for obstructive lesion.  EXAM: RENAL/URINARY TRACT ULTRASOUND COMPLETE  COMPARISON:  None.  FINDINGS: Right Kidney:  Length: 9.8 cm. Echogenicity within normal limits. No mass or hydronephrosis visualized.  Left Kidney:  Length: 9.5 cm. Echogenicity within normal limits. No mass or hydronephrosis visualized.  Bladder:  Appears normal for degree of bladder distention.  IMPRESSION: Negative.   Electronically Signed   By: Rolla Flatten M.D.   On: 07/28/2014 15:03   Dg Chest Port 1 View  07/28/2014   CLINICAL DATA:  Nausea and vomiting  EXAM: PORTABLE CHEST - 1 VIEW   COMPARISON:  None.  FINDINGS: The heart size and mediastinal contours are within normal limits. Both lungs are clear. The visualized skeletal structures are unremarkable.  IMPRESSION: No active disease.   Electronically Signed   By: Inez Catalina M.D.   On: 07/28/2014 09:42   Etta Quill PA-C [facility] [id number]  07/28/2014, 3:31 PM   Patient seen and examined.  Clinical course and management discussed.  Necessary edits performed.  I agree with the above.  Assessment and plan of care developed and discussed below.     Assessment: 49 y.o. male with sudden onset of vertigo/nausea and gait instability.  MRI of the brain personally reviewed and shows an acute left pontine infarct.  Patient with poorly controlled diabetes.  On no antiplatelet therapy at home.    Stroke Risk Factors - diabetes mellitus and hypertension  Recommendations: 1. HgbA1c, fasting lipid panel 2. PT consult, OT consult, Speech consult 3. Echocardiogram 4. Carotid dopplers 5. Prophylactic therapy-Antiplatelet med: Aspirin - dose 325mg  daily 6. NPO until RN stroke swallow screen 7. Telemetry monitoring 8. Frequent neuro checks 9. Blood sugar control   Alexis Goodell, MD [facility] [phone]  07/28/2014  4:27 PM

## 2014-07-28 NOTE — Progress Notes (Addendum)
PROGRESS NOTE  Adrian Neal [id number] DOB: [birth year] DOA: 07/27/2014 PCP: No primary care provider on file.  HPI/Recap of past 24 hours: Reported feeling dizzy, nausea, intermittent vomiting, wife at bedside  Assessment/Plan: Principal Problem:   Vertigo Active Problems:   Type 1 diabetes mellitus   Hyperlipidemia   Intractable nausea and vomiting   Essential hypertension  Acute CVA: mri showed 5mm acute LEFT mesial brachium pontis infarct On tele, carotid us/echocardiogram pending. will allow permissive htn for today. ldl 70, TSH wnl, a1c pending, high dose asa for now, neurology consulted.  N/v/dizziness: from CVA? Symptomatic control, prn reglan, meclizine. ivf  DM1, a1c pending, patient use insulin pump at home. Agreed with subQ insulin here. Will need to close monitor bmp, prevent patient getting into dka, continue ivf, increase basal insulin.  Htn: allow permissive htn for today. Patient does has sinus tachycardia, from stress, no sign of infection. Low dose lopressor iv for now.  ARF, unknown baseline. ua unremarkable, Likely from dehydration, could have underline ckd from longstanding diabetes. Renal US pending. Hydration, renal dosing meds  dvt prophlaxis: lovenox  Code Status: full  Family Communication: patient and wife  Disposition Plan: remain in patient   Consultants:  neurology  Procedures:  Mri/echo  Antibiotics:  none   Objective: BP 161/63 mmHg  Pulse 113  Temp(Src) 98.2 F (36.8 C) (Oral)  Resp 18  Ht 5\' 8"  (1.727 m)  Wt 76.8 kg (169 lb 5 oz)  BMI 25.75 kg/m2  SpO2 100%  Intake/Output Summary (Last 24 hours) at 07/28/14 1455 Last data filed at 07/28/14 1300  Gross per 24 hour  Intake 4011.33 ml  Output   3180 ml  Net 831.33 ml   Filed Weights   07/28/14 0018  Weight: 76.8 kg (169 lb 5 oz)    Exam:   General:  In moderate distress due to dizziness, n/v. Has his eye closed.  Cardiovascular: sinus  tachycardia  Respiratory: CTABL  Abdomen: Soft/ND/NT, positive BS  Musculoskeletal: No Edema  Neuro: no obvious focal deficit.  Data Reviewed: Basic Metabolic Panel:  Recent Labs Lab 07/27/14 1638 07/28/14 0656  NA 142 142  K 3.9 3.6  CL 108 109  CO2 24 19  GLUCOSE 187* 319*  BUN 24* 27*  CREATININE 1.58* 2.10*  CALCIUM 9.1 8.9   Liver Function Tests:  Recent Labs Lab 07/28/14 1036  AST 20  ALT 24  ALKPHOS 88  BILITOT 1.7*  PROT 6.1  ALBUMIN 3.4*    Recent Labs Lab 07/27/14 1638  LIPASE 22   No results for input(s): AMMONIA in the last 168 hours. CBC:  Recent Labs Lab 07/27/14 1638 07/28/14 0656  WBC 13.3* 14.2*  HGB 15.2 13.8  HCT 43.3 42.8  MCV 84.9 87.7  PLT 124* 134*   Cardiac Enzymes:   No results for input(s): CKTOTAL, CKMB, CKMBINDEX, TROPONINI in the last 168 hours. BNP (last 3 results) No results for input(s): BNP in the last 8760 hours.  ProBNP (last 3 results) No results for input(s): PROBNP in the last 8760 hours.  CBG:  Recent Labs Lab 07/27/14 2323 07/28/14 0442 07/28/14 0733 07/28/14 1146  GLUCAP 210* 316* 249* 273*    No results found for this or any previous visit (from the past 240 hour(s)).   Studies: No results found.  Scheduled Meds: . enoxaparin (LOVENOX) injection  40 mg Subcutaneous Q24H  . insulin aspart  0-9 Units Subcutaneous 6 times per day  . insulin glargine  20 Units  Subcutaneous QHS  . metoCLOPramide (REGLAN) injection  5 mg Intravenous 3 times per day  . metoprolol  2.5 mg Intravenous 3 times per day  . omega-3 acid ethyl esters  1 g Oral BID  . ramipril  10 mg Oral Daily  . simvastatin  40 mg Oral Daily  . sodium chloride  3 mL Intravenous Q12H    Continuous Infusions: . sodium chloride 100 mL/hr at 07/28/14 1429     Time spent: 80mins  Remonia Otte MD, PhD  [facility] Pager [phone]. If 7PM-7AM, please contact night-coverage at [email or web address], password [facility] 07/28/2014, 2:55 PM   LOS: 0 days

## 2014-07-28 NOTE — Progress Notes (Signed)
Pt b/p 131/42,  Hr 108. made MD aware, instructed to give scheduled Metoprolol 2.5mg .

## 2014-07-28 NOTE — Progress Notes (Signed)
Pt bp 202/96, hr 113. Paged MD, awaiting call back. Will continue to monitor.

## 2014-07-28 NOTE — Progress Notes (Addendum)
Inpatient Diabetes Program Recommendations  AACE/ADA: New Consensus Statement on Inpatient Glycemic Control (2013)  Target Ranges:  Prepandial:   less than 140 mg/dL      Peak postprandial:   less than 180 mg/dL (1-2 hours)      Critically ill patients:  140 - 180 mg/dL   Results for Adrian Neal, Adrian Neal (MRN [id number]) as of 07/28/2014 10:57  Ref. Range 07/27/2014 23:23 07/28/2014 04:42 07/28/2014 07:33  Glucose-Capillary Latest Range: 70-99 mg/dL 210 (H) 316 (H) 249 (H)   Diabetes history: DM1 Outpatient Diabetes medications: Animas insulin pump with Humalog Current orders for Inpatient glycemic control: Lantus 5 units QHS, Novolog 0-9 units Q4H  Inpatient Diabetes Program Recommendations Insulin - Basal: Please consider ordering Lantus 20 units daily (to start now).  Note: Spoke with patient and his wife about diabetes and home regimen for diabetes control. Patient reports that he has Type 1 diabetes is followed by Dr. Chalmers Cater for diabetes management and currently he uses an Animas insulin pump with Humalog insulin as an outpatient for diabetes control. Patient states that he has been on this particular pump since November 2015. Prior to that he was on a different insulin pump. In talking with patient and his wife, they report that patient has been having frequent low blood sugars over the past 6 weeks. Patient's wife states that over the past 3 weeks patient has required assistance with hypoglycemia treatment and EMS had to be called with one hypoglycemic episode that was severe. Patient last saw Dr. Chalmers Cater in October 2015 right before going on the [facility] insulin pump. Patient reports that his insulin pump settings were set up at Dr. Almetta Lovely office. Reviewed insulin pump settings with patient's wife who has the insulin pump at this time. The current insulin pump settings are:   Total basal insulin per day: 33.51 units Basal rates: 12A 1.30 units/hour 3A 1.4  units/hour 9:30A 1.425   units/hour 10PM 1.35  units/hour  Insulin to Carb Ratio: 1:5 grams (1 unit covers 5 grams of carbs) Insulin Sensitivity Factor: 1:10 (1 unit drops glucose 10 mg/dl)  Called Dr. Almetta Lovely office to confirm insulin pump settings. Spoke with Caren Griffins, RN and she states patient was last seen in October 2015 prior to starting on the [facility] insulin pump. She provided the exact same settings as noted above. Informed Caren Griffins, RN about patient experiencing frequent hypoglycemia. Patient will need to follow up soon with Dr. Chalmers Cater.  Patient states that still feels dizzy and nauseated. Patient is agreeable to using insulin injections for glycemic control at this time. Concerned about patient going into DKA since CO2 is lower and AG is higher on BMET from 6:56am; also since patient only received Lantus 5 units for basal needs. Please increase Lantus to 20 units daily to start now and continue Novolog correction Q4H at this time.  Thanks, Barnie Alderman, RN, MSN, CCRN Diabetes Coordinator Inpatient Diabetes Program [phone] (Team Pager) [phone] (AP office) [phone] [facility] office)

## 2014-07-28 NOTE — Progress Notes (Signed)
Pt CBG 253, Paged MD, awaiting callback.  will continue to monitor.

## 2014-07-28 NOTE — Progress Notes (Signed)
Made Dr. Hilbert Bible aware of pt blood pressure 170/69, hr 117. No new orders given. Instructed to notify MD if HR equal to or greater than 130 and pt wake up complaining of nausea or vomiting.  Will continue to monitor.

## 2014-07-29 ENCOUNTER — Inpatient Hospital Stay (HOSPITAL_COMMUNITY): Payer: 59

## 2014-07-29 ENCOUNTER — Encounter (HOSPITAL_COMMUNITY): Payer: Self-pay | Admitting: Radiology

## 2014-07-29 DIAGNOSIS — E1059 Type 1 diabetes mellitus with other circulatory complications: Secondary | ICD-10-CM

## 2014-07-29 DIAGNOSIS — I6330 Cerebral infarction due to thrombosis of unspecified cerebral artery: Secondary | ICD-10-CM

## 2014-07-29 DIAGNOSIS — R748 Abnormal levels of other serum enzymes: Secondary | ICD-10-CM

## 2014-07-29 DIAGNOSIS — N179 Acute kidney failure, unspecified: Secondary | ICD-10-CM | POA: Insufficient documentation

## 2014-07-29 DIAGNOSIS — Z794 Long term (current) use of insulin: Secondary | ICD-10-CM

## 2014-07-29 DIAGNOSIS — I6789 Other cerebrovascular disease: Secondary | ICD-10-CM

## 2014-07-29 DIAGNOSIS — E119 Type 2 diabetes mellitus without complications: Secondary | ICD-10-CM

## 2014-07-29 DIAGNOSIS — IMO0001 Reserved for inherently not codable concepts without codable children: Secondary | ICD-10-CM | POA: Insufficient documentation

## 2014-07-29 LAB — URINALYSIS, ROUTINE W REFLEX MICROSCOPIC
Bilirubin Urine: NEGATIVE
Glucose, UA: >1000 mg/dL — AB
Hgb urine dipstick: NEGATIVE
Ketones, ur: 15 mg/dL — AB
Leukocytes, UA: NEGATIVE
NITRITE: NEGATIVE
Protein, ur: 100 mg/dL — AB
SPECIFIC GRAVITY, URINE: 1.017 (ref 1.005–1.030)
UROBILINOGEN UA: 0.2 mg/dL (ref 0.0–1.0)
pH: 5 (ref 5.0–8.0)

## 2014-07-29 LAB — COMPREHENSIVE METABOLIC PANEL
ALBUMIN: 3.2 g/dL — AB (ref 3.5–5.2)
ALT: 22 U/L (ref 0–53)
AST: 29 U/L (ref 0–37)
Alkaline Phosphatase: 94 U/L (ref 39–117)
Anion gap: 13 (ref 5–15)
BUN: 54 mg/dL — ABNORMAL HIGH (ref 6–23)
CALCIUM: 8.8 mg/dL (ref 8.4–10.5)
CO2: 17 mmol/L — ABNORMAL LOW (ref 19–32)
CREATININE: 2.61 mg/dL — AB (ref 0.50–1.35)
Chloride: 105 mmol/L (ref 96–112)
GFR, EST AFRICAN AMERICAN: 32 mL/min — AB (ref >90)
GFR, EST NON AFRICAN AMERICAN: 27 mL/min — AB (ref >90)
Glucose, Bld: 372 mg/dL — ABNORMAL HIGH (ref 70–99)
Potassium: 4 mmol/L (ref 3.5–5.1)
SODIUM: 135 mmol/L (ref 135–145)
Total Bilirubin: 1.6 mg/dL — ABNORMAL HIGH (ref 0.3–1.2)
Total Protein: 5.6 g/dL — ABNORMAL LOW (ref 6.0–8.3)

## 2014-07-29 LAB — GLUCOSE, CAPILLARY
GLUCOSE-CAPILLARY: 390 mg/dL — AB (ref 70–99)
GLUCOSE-CAPILLARY: 295 mg/dL — AB (ref 70–99)
GLUCOSE-CAPILLARY: 395 mg/dL — AB (ref 70–99)
GLUCOSE-CAPILLARY: 326 mg/dL — AB (ref 70–99)
GLUCOSE-CAPILLARY: 390 mg/dL — AB (ref 70–99)
Glucose-Capillary: 453 mg/dL — ABNORMAL HIGH (ref 70–99)

## 2014-07-29 LAB — HEMOGLOBIN A1C
HEMOGLOBIN A1C: 8.1 % — AB (ref 4.8–5.6)
Mean Plasma Glucose: 186 mg/dL

## 2014-07-29 LAB — CBC
HEMATOCRIT: 44.8 % (ref 39.0–52.0)
Hemoglobin: 15.4 g/dL (ref 13.0–17.0)
MCH: 29.9 pg (ref 26.0–34.0)
MCHC: 34.4 g/dL (ref 30.0–36.0)
MCV: 87 fL (ref 78.0–100.0)
Platelets: 161 10*3/uL (ref 150–400)
RBC: 5.15 MIL/uL (ref 4.22–5.81)
RDW: 13.1 % (ref 11.5–15.5)
WBC: 21.5 10*3/uL — AB (ref 4.0–10.5)

## 2014-07-29 LAB — URINE MICROSCOPIC-ADD ON

## 2014-07-29 LAB — MAGNESIUM: MAGNESIUM: 2 mg/dL (ref 1.5–2.5)

## 2014-07-29 MED ORDER — INSULIN PUMP
Freq: Three times a day (TID) | SUBCUTANEOUS | Status: DC
  Administered 2014-07-30: 5 via SUBCUTANEOUS
  Administered 2014-07-31 (×2): via SUBCUTANEOUS
  Filled 2014-07-29: qty 1

## 2014-07-29 MED ORDER — METOPROLOL TARTRATE 1 MG/ML IV SOLN
5.0000 mg | Freq: Four times a day (QID) | INTRAVENOUS | Status: DC
  Administered 2014-07-29 – 2014-07-31 (×8): 5 mg via INTRAVENOUS
  Filled 2014-07-29 (×12): qty 5

## 2014-07-29 MED ORDER — INSULIN ASPART 100 UNIT/ML ~~LOC~~ SOLN
8.0000 [IU] | Freq: Once | SUBCUTANEOUS | Status: AC
  Administered 2014-07-29: 8 [IU] via SUBCUTANEOUS

## 2014-07-29 MED ORDER — INSULIN GLARGINE 100 UNIT/ML ~~LOC~~ SOLN
30.0000 [IU] | Freq: Every day | SUBCUTANEOUS | Status: DC
  Filled 2014-07-29 (×4): qty 0.3

## 2014-07-29 MED ORDER — CHLORPROMAZINE HCL 10 MG PO TABS
10.0000 mg | ORAL_TABLET | Freq: Once | ORAL | Status: AC
  Administered 2014-07-29: 10 mg via ORAL
  Filled 2014-07-29: qty 1

## 2014-07-29 MED ORDER — ENOXAPARIN SODIUM 30 MG/0.3ML ~~LOC~~ SOLN
30.0000 mg | SUBCUTANEOUS | Status: DC
  Administered 2014-07-30: 30 mg via SUBCUTANEOUS
  Filled 2014-07-29: qty 0.3

## 2014-07-29 NOTE — Progress Notes (Signed)
PROGRESS NOTE  Adrian Neal [id number] DOB: 14-Oct-1965 DOA: 07/27/2014 PCP: No primary care provider on file.  HPI/Recap of past 24 hours: Reported persistent dizzy, nausea, intermittent vomiting, but overall a little better, want to go back on his own insulin pump, wife at bedside  Assessment/Plan: Principal Problem:   Vertigo Active Problems:   Type 1 diabetes mellitus   Hyperlipidemia   Intractable nausea and vomiting   Essential hypertension   Cerebral thrombosis with cerebral infarction  Acute CVA: mri showed 38mm acute LEFT mesial brachium pontis infarct On tele, carotid US unremarkable, echocardiogram lVH/LVEF wnl/grade II diastolic dysfunction. allow permissive htn for today. ldl 70, TSH wnl, a1c 8.1, high dose asa for now, neurology consulted. CIR admission?  N/v/dizziness: from CVA? Symptomatic control, prn reglan, meclizine. ivf  DM1, a1c 8.1, patient want to use insulin pump here, diabetes nurse to coordinate insulin pump use here.  Htn: allow permissive htn for today. Patient does has sinus tachycardia, from stress?, no sign of infection. Low dose lopressor iv for now.  ARF, unknown baseline. ua unremarkable, Likely from dehydration, could have underline ckd from longstanding diabetes. Renal US negative. Hydration, renal dosing meds. Worsening of cr today, repeat ua again concentrated urine, no infection, ct ab no acute finding either. Will continue ivf.  dvt prophlaxis: lovenox 30mg  sub Q qd  Code Status: full  Family Communication: patient and wife  Disposition Plan: remain in patient   Consultants:  Neurology/CIR  Procedures:  Mri/echo  Antibiotics:  none   Objective: BP 170/67 mmHg  Pulse 104  Temp(Src) 97.5 F (36.4 C) (Oral)  Resp 18  Ht 5\' 8"  (1.727 m)  Wt 76.8 kg (169 lb 5 oz)  BMI 25.75 kg/m2  SpO2 100%  Intake/Output Summary (Last 24 hours) at 07/29/14 1956 Last data filed at 07/29/14 1900  Gross per 24 hour  Intake    4080 ml  Output   2440 ml  Net   1640 ml   Filed Weights   07/28/14 0018  Weight: 76.8 kg (169 lb 5 oz)    Exam:   General:  In moderate distress due to dizziness, n/v. Has his eye closed.  Cardiovascular: sinus tachycardia  Respiratory: CTABL  Abdomen: Soft/ND/NT, positive BS  Musculoskeletal: No Edema  Neuro: no obvious focal deficit.  Data Reviewed: Basic Metabolic Panel:  Recent Labs Lab 07/27/14 1638 07/28/14 0656 07/29/14 0536  NA 142 142 135  K 3.9 3.6 4.0  CL 108 109 105  CO2 24 19 17*  GLUCOSE 187* 319* 372*  BUN 24* 27* 54*  CREATININE 1.58* 2.10* 2.61*  CALCIUM 9.1 8.9 8.8  MG  --   --  2.0   Liver Function Tests:  Recent Labs Lab 07/28/14 1036 07/29/14 0536  AST 20 29  ALT 24 22  ALKPHOS 88 94  BILITOT 1.7* 1.6*  PROT 6.1 5.6*  ALBUMIN 3.4* 3.2*    Recent Labs Lab 07/27/14 1638  LIPASE 22   No results for input(s): AMMONIA in the last 168 hours. CBC:  Recent Labs Lab 07/27/14 1638 07/28/14 0656 07/29/14 0536  WBC 13.3* 14.2* 21.5*  HGB 15.2 13.8 15.4  HCT 43.3 42.8 44.8  MCV 84.9 87.7 87.0  PLT 124* 134* 161   Cardiac Enzymes:   No results for input(s): CKTOTAL, CKMB, CKMBINDEX, TROPONINI in the last 168 hours. BNP (last 3 results) No results for input(s): BNP in the last 8760 hours.  ProBNP (last 3 results) No results for input(s): PROBNP in  the last 8760 hours.  CBG:  Recent Labs Lab 07/29/14 0012 07/29/14 0405 07/29/14 0731 07/29/14 1209 07/29/14 1618  GLUCAP 453* 390* 295* 395* 326*    No results found for this or any previous visit (from the past 240 hour(s)).   Studies: Mr Brain Wo Contrast  07/28/2014   CLINICAL DATA:  Acute onset dizziness beginning at 1 p.m. yesterday afternoon, followed by multiple episodes of vomiting and epigastric pain. LEFT temporal headache for few days without visual disturbance. History of hypertension and diabetes.  EXAM: MRI HEAD WITHOUT CONTRAST  TECHNIQUE: Multiplanar,  multiecho pulse sequences of the brain and surrounding structures were obtained without intravenous contrast.  COMPARISON:  None.  FINDINGS: 9 mm ovoid area reduced diffusion within LEFT mesial brachium pontis with corresponding low ADC value. No susceptibility artifact to suggest hemorrhage.  The ventricles and sulci are normal for patient's age. No midline shift, mass effect or mass lesions. A few subcentimeter supratentorial white matter T2 hyperintensities, nonspecific and within normal range for patient's age.  No abnormal extra-axial fluid collections. No extra-axial masses though, contrast enhanced sequences would be more sensitive. Normal major intracranial vascular flow voids seen at the skull base.  Ocular globes and orbital contents are unremarkable though not tailored for evaluation. Status post apparent RIGHT ocular lens implant. No abnormal sellar expansion. Mild paranasal sinus mucosal thickening without air-fluid levels. Trace LEFT mastoid effusion. No suspicious calvarial bone marrow signal. No abnormal sellar expansion. Craniocervical junction maintained.  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  Otherwise normal MRI of the brain without contrast for age.  Mild paranasal sinusitis, trace LEFT mastoid effusion.   Electronically Signed   By: Elon Alas   On: 07/28/2014 03:19   US Renal  07/28/2014   CLINICAL DATA:  Acute renal failure. Evaluate for obstructive lesion.  EXAM: RENAL/URINARY TRACT ULTRASOUND COMPLETE  COMPARISON:  None.  FINDINGS: Right Kidney:  Length: 9.8 cm. Echogenicity within normal limits. No mass or hydronephrosis visualized.  Left Kidney:  Length: 9.5 cm. Echogenicity within normal limits. No mass or hydronephrosis visualized.  Bladder:  Appears normal for degree of bladder distention.  IMPRESSION: Negative.   Electronically Signed   By: Rolla Flatten M.D.   On: 07/28/2014 15:03   Dg Chest Port 1 View  07/28/2014   CLINICAL DATA:  Nausea and  vomiting  EXAM: PORTABLE CHEST - 1 VIEW  COMPARISON:  None.  FINDINGS: The heart size and mediastinal contours are within normal limits. Both lungs are clear. The visualized skeletal structures are unremarkable.  IMPRESSION: No active disease.   Electronically Signed   By: Inez Catalina M.D.   On: 07/28/2014 09:42    Scheduled Meds: . aspirin EC  325 mg Oral Daily  . [START ON 07/30/2014] enoxaparin (LOVENOX) injection  30 mg Subcutaneous Q24H  . insulin aspart  0-9 Units Subcutaneous 6 times per day  . insulin glargine  30 Units Subcutaneous QHS  . insulin pump   Subcutaneous TID AC, HS, 0200  . metoprolol  5 mg Intravenous 4 times per day  . omega-3 acid ethyl esters  1 g Oral BID  . simvastatin  40 mg Oral Daily  . sodium chloride  3 mL Intravenous Q12H    Continuous Infusions: . sodium chloride 100 mL/hr at 07/29/14 0935     Time spent: 41mins  Wende Longstreth MD, PhD  [facility] Pager [phone]. If 7PM-7AM, please contact night-coverage at [email or web address], password [facility] 07/29/2014, 7:56 PM  LOS: 1  day

## 2014-07-29 NOTE — Progress Notes (Signed)
  Echocardiogram 2D Echocardiogram has been performed.  Adrian Neal FRANCES 07/29/2014, 11:52 AM

## 2014-07-29 NOTE — Evaluation (Signed)
Physical Therapy Evaluation Patient Details Name: Adrian Neal MRN: [id number] DOB: 25-Aug-1965 Today's Date: 07/29/2014   History of Present Illness  Pt. was admitted 07/27/14 with dizziness and nausea, falling toward left.   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  Pt. with PMH of poorly controlled diabetes (he reports being diabetic since age 49) and HTN.  Pt. wears an insulin pump normally.  Clinical Impression  Pt. Presents to PT with balance/visual disturbance and gait and functional mobility deficits following a L pontine CVA. He needs external support for all mobility tasks due to feeling as thought the room is spinning, and attempts to compensate for feeling of falling left by weightshifting toward right side.  Pt. was independent and working full time prior to his CVA. He is motivated to return to functional independence and to work eventually.  He will benefit from acute PT to address these and below problem areas.  Pt. Will benefit from intensive therapies of CIR to maximize his functional independence prior to DC home with his wife.  I have requested a CIR screen.      Follow Up Recommendations CIR    Equipment Recommendations  Other (comment) (TBD)    Recommendations for Other Services Rehab consult     Precautions / Restrictions Precautions Precautions: Fall Restrictions Weight Bearing Restrictions: No      Mobility  Bed Mobility Overal bed mobility: Needs Assistance Bed Mobility: Supine to Sit     Supine to sit: Min guard     General bed mobility comments: Pt. with visual disturbance, feeling of room spinning necessitating close supervision to min guard assist.  Pt. using bed rail and foot board for added stability as he transitioned to sitting position at EOB  Transfers Overall transfer level: Needs assistance Equipment used: Rolling walker (2 wheeled) Transfers: Sit to/from Stand Sit to Stand: Min assist         General transfer comment: Pt.  had adequate strenght to rise to stand on his own power, however due to visual disturbance/room spinning, pt. needed min assist to provide stability in translation to standing  Ambulation/Gait Ambulation/Gait assistance: Min assist Ambulation Distance (Feet): 20 Feet Assistive device: Rolling walker (2 wheeled) Gait Pattern/deviations: Step-through pattern;Decreased weight shift to right;Staggering left;Drifts right/left;Wide base of support Gait velocity: decreased   General Gait Details: Pt. with notable decreased coordination in L LE with ambulation as well as decreased control of knee extension with hyperextension at times. Pt. with left lean tendancy during short distance gait.  Needed external support for feeling of stability.  Stairs            Wheelchair Mobility    Modified Rankin (Stroke Patients Only)       Balance Overall balance assessment: Needs assistance Sitting-balance support: Bilateral upper extremity supported Sitting balance-Leahy Scale: Poor Sitting balance - Comments: Pt. needed bilateral UE support for stability due to feeling of room spinning and leaning toward left when he was actually leaning right to accomodate .  Head iis tolted toward right in upright sitting.  He keeps eyes closed as much as possible to minimiize effects of visual distrubance Postural control: Other (comment) (pt. feels he leans left when he is actually leaning right) Standing balance support: Bilateral upper extremity supported;During functional activity Standing balance-Leahy Scale: Poor Standing balance comment: needs use of RW or shelf arm support from therapist for grounding and added stability in standing position  Pertinent Vitals/Pain Pain Assessment: No/denies pain    Home Living Family/patient expects to be discharged to:: Private residence Living Arrangements: Children;Spouse/significant other (kids 6. 15 and 46) Available Help  at Discharge: Family;Available 24 hours/day Type of Home: House Home Access: Level entry     Home Layout: Two level Home Equipment: None      Prior Function Level of Independence: Independent         Comments: drives, works as  Freight forwarder at [employer] in [place] Hand: Right    Extremity/Trunk Assessment   Upper Extremity Assessment: Defer to OT evaluation           Lower Extremity Assessment: LLE deficits/detail   LLE Deficits / Details: slight weakness in L LE noted compared to right , no worse than 4+/5  Cervical / Trunk Assessment: Normal  Communication   Communication: No difficulties  Cognition Arousal/Alertness: Awake/alert Behavior During Therapy: WFL for tasks assessed/performed Overall Cognitive Status: Within Functional Limits for tasks assessed                      General Comments      Exercises        Assessment/Plan    PT Assessment Patient needs continued PT services  PT Diagnosis Difficulty walking;Abnormality of gait;Hemiplegia non-dominant side   PT Problem List Decreased strength;Decreased activity tolerance;Decreased balance;Decreased mobility;Decreased coordination;Decreased knowledge of use of DME;Decreased knowledge of precautions  PT Treatment Interventions DME instruction;Gait training;Stair training;Functional mobility training;Therapeutic activities;Therapeutic exercise;Balance training;Patient/family education   PT Goals (Current goals can be found in the Care Plan section) Acute Rehab PT Goals Patient Stated Goal: pt. wants to return to work when he is able PT Goal Formulation: With patient Time For Goal Achievement: 08/05/14 Potential to Achieve Goals: Good    Frequency Min 4X/week   Barriers to discharge        Co-evaluation               End of Session Equipment Utilized During Treatment: Gait belt Activity Tolerance: Other (comment) (limited by visual  disturbance/dizziness/vertigo) Patient left: in chair;with call bell/phone within reach;with chair alarm set;with family/visitor present (wife and 2 kids present as well as Theme park manager) Nurse Communication: Mobility status         Time: [phone] PT Time Calculation (min) (ACUTE ONLY): 30 min   Charges:   PT Evaluation $Initial PT Evaluation Tier I: 1 Procedure PT Treatments $Gait Training: 8-22 mins   PT G CodesLadona Ridgel 07/29/2014, 9:42 AM Gerlean Ren PT Acute Rehab Services [phone] Beeper [phone]

## 2014-07-29 NOTE — Progress Notes (Signed)
Inpatient Diabetes Program Recommendations  AACE/ADA: New Consensus Statement on Inpatient Glycemic Control (2013)  Target Ranges:  Prepandial:   less than 140 mg/dL      Peak postprandial:   less than 180 mg/dL (1-2 hours)      Critically ill patients:  140 - 180 mg/dL   Results for Adrian Neal, Adrian Neal (MRN [id number]) as of 07/29/2014 07:29  Ref. Range 07/28/2014 15:55 07/28/2014 20:15 07/28/2014 22:01 07/28/2014 23:27 07/29/2014 00:12 07/29/2014 04:05  Glucose-Capillary Latest Range: 70-99 mg/dL 356 (H) 453 (H) 447 (H) 423 (H) 453 (H) 390 (H)    Diabetes history: DM 1 Outpatient Diabetes medications: Animas Insulin pump with Humalog Current orders for Inpatient glycemic control: Lantus 20 units QHS, Novolog 0-9 units Q4hrs  Inpatient Diabetes Program Recommendations Insulin - Basal: Glucose in the 300-400 range last pm. Patient's lab glucose 390 this am. Please increase basal insulin to Lantus 30 units Q24 hrs.  Thanks,  Tama Headings RN, MSN, [facility] Inpatient Diabetes Coordinator Team Pager [phone]

## 2014-07-29 NOTE — Consult Note (Signed)
Physical Medicine and Rehabilitation Consult   Reason for Consult: Dizziness with difficulty walking Referring Physician:  Dr. Dillard Cannon   HPI: Adrian Neal is a 49 y.o. male with history of DM and HTN who was admitted on 03/30 with complaints of dizziness, nausea and difficulty walking.  MRI of brain done revealing acute left pontine infarct.  Carotid dopplers without significant ICA stenosis. Neurology consulted for input and recommended ASA for secondary stroke prevention. 2D echo done today. PT evaluation done revealing staggering gait with LLE ataxia. CIR recommended by MD and rehab team.    Review of Systems  Constitutional: Negative for fever and chills.  Eyes: Positive for blurred vision, double vision and photophobia.  Cardiovascular: Negative for chest pain and palpitations.  Gastrointestinal: Positive for nausea and vomiting.  Neurological: Positive for dizziness, tingling, focal weakness and headaches.      Past Medical History  Diagnosis Date  . Hypertension   . Diabetes mellitus without complication      Past Surgical History  Procedure Laterality Date  . Eye surgery       Family History  Problem Relation Age of Onset  . Stroke Mother   . Hypertension Mother   . Hyperlipidemia Mother   . Hyperlipidemia Father   . Hypertension Father     Social History:  reports that he quit smoking about 23 years ago. He does not have any smokeless tobacco history on file. He reports that he does not drink alcohol or use illicit drugs.    Allergies: No Known Allergies    Medications Prior to Admission  Medication Sig Dispense Refill  . HUMALOG 100 UNIT/ML injection Inject 60 Units into the skin daily.     . metoprolol succinate (TOPROL-XL) 25 MG 24 hr tablet Take 25 mg by mouth daily.     . naproxen sodium (ANAPROX) 220 MG tablet Take 220 mg by mouth 2 (two) times daily with a meal. For knee pain    . omega-3 acid ethyl esters (LOVAZA) 1 G capsule Take 1 g  by mouth 2 (two) times daily.    . ramipril (ALTACE) 10 MG capsule Take 10 mg by mouth daily.    . simvastatin (ZOCOR) 40 MG tablet Take 40 mg by mouth daily.      Home: Home Living Family/patient expects to be discharged to:: Private residence Living Arrangements: Children, Spouse/significant other (kids 17. 49 and 80) Available Help at Discharge: Family, Available 24 hours/day Type of Home: House Home Access: Level entry Home Layout: Two level Alternate Level Stairs-Number of Steps: flight Alternate Level Stairs-Rails: Right, Left, Can reach both Home Equipment: None  Functional History: Prior Function Level of Independence: Independent Comments: drives, works as  Freight forwarder at [employer] in [place]:  Mobility: Bed Mobility Overal bed mobility: Needs Assistance Bed Mobility: Supine to Sit Supine to sit: Min guard General bed mobility comments: Pt. with visual disturbance, feeling of room spinning necessitating close supervision to min guard assist.  Pt. using bed rail and foot board for added stability as he transitioned to sitting position at EOB Transfers Overall transfer level: Needs assistance Equipment used: Rolling walker (2 wheeled) Transfers: Sit to/from Stand Sit to Stand: Min assist General transfer comment: Pt. had adequate strenght to rise to stand on his own power, however due to visual disturbance/room spinning, pt. needed min assist to provide stability in translation to standing Ambulation/Gait Ambulation/Gait assistance: Min assist Ambulation Distance (Feet): 20 Feet Assistive device: Rolling  walker (2 wheeled) Gait Pattern/deviations: Step-through pattern, Decreased weight shift to right, Staggering left, Drifts right/left, Wide base of support Gait velocity: decreased General Gait Details: Pt. with notable decreased coordination in L LE with ambulation as well as decreased control of knee extension with hyperextension at times. Pt. with  left lean tendancy during short distance gait.  Needed external support for feeling of stability.    ADL:    Cognition: Cognition Overall Cognitive Status: Within Functional Limits for tasks assessed Orientation Level: Oriented X4 Memory: Appears intact Awareness: Appears intact Problem Solving: Appears intact Safety/Judgment: Appears intact Cognition Arousal/Alertness: Awake/alert Behavior During Therapy: WFL for tasks assessed/performed Overall Cognitive Status: Within Functional Limits for tasks assessed  Blood pressure 184/67, pulse 104, temperature 97.5 F (36.4 C), temperature source Oral, resp. rate 18, height 5\' 8"  (1.727 m), weight 76.8 kg (169 lb 5 oz), SpO2 100 %. Physical Exam  Constitutional: He appears distressed.  HENT:  Head: Normocephalic and atraumatic.  Eyes: Conjunctivae and EOM are normal. Pupils are equal, round, and reactive to light.  Neck: No tracheal deviation present. No thyromegaly present.  Cardiovascular:  Tachycardic   Respiratory: No respiratory distress. He has no wheezes.  Neurological:  Leaning to right. Vomiting. Moves all 4's. Limited eye contact---i did not push him on exam as he was feeling so poorly.   Psychiatric:  Distracted, distressed    Results for orders placed or performed during the hospital encounter of 07/27/14 (from the past 24 hour(s))  Glucose, capillary     Status: Abnormal   Collection Time: 07/28/14  3:55 PM  Result Value Ref Range   Glucose-Capillary 356 (H) 70 - 99 mg/dL  Glucose, capillary     Status: Abnormal   Collection Time: 07/28/14  8:15 PM  Result Value Ref Range   Glucose-Capillary 453 (H) 70 - 99 mg/dL  Glucose, capillary     Status: Abnormal   Collection Time: 07/28/14 10:01 PM  Result Value Ref Range   Glucose-Capillary 447 (H) 70 - 99 mg/dL  Glucose, capillary     Status: Abnormal   Collection Time: 07/28/14 11:27 PM  Result Value Ref Range   Glucose-Capillary 423 (H) 70 - 99 mg/dL  Glucose,  capillary     Status: Abnormal   Collection Time: 07/29/14 12:12 AM  Result Value Ref Range   Glucose-Capillary 453 (H) 70 - 99 mg/dL  Glucose, capillary     Status: Abnormal   Collection Time: 07/29/14  4:05 AM  Result Value Ref Range   Glucose-Capillary 390 (H) 70 - 99 mg/dL  CBC     Status: Abnormal   Collection Time: 07/29/14  5:36 AM  Result Value Ref Range   WBC 21.5 (H) 4.0 - 10.5 K/uL   RBC 5.15 4.22 - 5.81 MIL/uL   Hemoglobin 15.4 13.0 - 17.0 g/dL   HCT 44.8 39.0 - 52.0 %   MCV 87.0 78.0 - 100.0 fL   MCH 29.9 26.0 - 34.0 pg   MCHC 34.4 30.0 - 36.0 g/dL   RDW 13.1 11.5 - 15.5 %   Platelets 161 150 - 400 K/uL  Magnesium     Status: None   Collection Time: 07/29/14  5:36 AM  Result Value Ref Range   Magnesium 2.0 1.5 - 2.5 mg/dL  Comprehensive metabolic panel     Status: Abnormal   Collection Time: 07/29/14  5:36 AM  Result Value Ref Range   Sodium 135 135 - 145 mmol/L   Potassium 4.0 3.5 - 5.1  mmol/L   Chloride 105 96 - 112 mmol/L   CO2 17 (L) 19 - 32 mmol/L   Glucose, Bld 372 (H) 70 - 99 mg/dL   BUN 54 (H) 6 - 23 mg/dL   Creatinine, Ser 2.61 (H) 0.50 - 1.35 mg/dL   Calcium 8.8 8.4 - 10.5 mg/dL   Total Protein 5.6 (L) 6.0 - 8.3 g/dL   Albumin 3.2 (L) 3.5 - 5.2 g/dL   AST 29 0 - 37 U/L   ALT 22 0 - 53 U/L   Alkaline Phosphatase 94 39 - 117 U/L   Total Bilirubin 1.6 (H) 0.3 - 1.2 mg/dL   GFR calc non Af Amer 27 (L) >90 mL/min   GFR calc Af Amer 32 (L) >90 mL/min   Anion gap 13 5 - 15  Glucose, capillary     Status: Abnormal   Collection Time: 07/29/14  7:31 AM  Result Value Ref Range   Glucose-Capillary 295 (H) 70 - 99 mg/dL  Glucose, capillary     Status: Abnormal   Collection Time: 07/29/14 12:09 PM  Result Value Ref Range   Glucose-Capillary 395 (H) 70 - 99 mg/dL   Mr Brain Wo Contrast  07/28/2014   CLINICAL DATA:  Acute onset dizziness beginning at 1 p.m. yesterday afternoon, followed by multiple episodes of vomiting and epigastric pain. LEFT temporal  headache for few days without visual disturbance. History of hypertension and diabetes.  EXAM: MRI HEAD WITHOUT CONTRAST  TECHNIQUE: Multiplanar, multiecho pulse sequences of the brain and surrounding structures were obtained without intravenous contrast.  COMPARISON:  None.  FINDINGS: 9 mm ovoid area reduced diffusion within LEFT mesial brachium pontis with corresponding low ADC value. No susceptibility artifact to suggest hemorrhage.  The ventricles and sulci are normal for patient's age. No midline shift, mass effect or mass lesions. A few subcentimeter supratentorial white matter T2 hyperintensities, nonspecific and within normal range for patient's age.  No abnormal extra-axial fluid collections. No extra-axial masses though, contrast enhanced sequences would be more sensitive. Normal major intracranial vascular flow voids seen at the skull base.  Ocular globes and orbital contents are unremarkable though not tailored for evaluation. Status post apparent RIGHT ocular lens implant. No abnormal sellar expansion. Mild paranasal sinus mucosal thickening without air-fluid levels. Trace LEFT mastoid effusion. No suspicious calvarial bone marrow signal. No abnormal sellar expansion. Craniocervical junction maintained.  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  Otherwise normal MRI of the brain without contrast for age.  Mild paranasal sinusitis, trace LEFT mastoid effusion.   Electronically Signed   By: Elon Alas   On: 07/28/2014 03:19   US Renal  07/28/2014   CLINICAL DATA:  Acute renal failure. Evaluate for obstructive lesion.  EXAM: RENAL/URINARY TRACT ULTRASOUND COMPLETE  COMPARISON:  None.  FINDINGS: Right Kidney:  Length: 9.8 cm. Echogenicity within normal limits. No mass or hydronephrosis visualized.  Left Kidney:  Length: 9.5 cm. Echogenicity within normal limits. No mass or hydronephrosis visualized.  Bladder:  Appears normal for degree of bladder distention.  IMPRESSION:  Negative.   Electronically Signed   By: Rolla Flatten M.D.   On: 07/28/2014 15:03   Dg Chest Port 1 View  07/28/2014   CLINICAL DATA:  Nausea and vomiting  EXAM: PORTABLE CHEST - 1 VIEW  COMPARISON:  None.  FINDINGS: The heart size and mediastinal contours are within normal limits. Both lungs are clear. The visualized skeletal structures are unremarkable.  IMPRESSION: No active disease.   Electronically  Signed   By: Inez Catalina M.D.   On: 07/28/2014 09:42    Assessment/Plan: Diagnosis: left pontine infarct 1. Does the need for close, 24 hr/day medical supervision in concert with the patient's rehab needs make it unreasonable for this patient to be served in a less intensive setting? Yes 2. Co-Morbidities requiring supervision/potential complications: dm, htn, n/v/d 3. Due to bladder management, bowel management, safety, skin/wound care, disease management, medication administration, pain management and patient education, does the patient require 24 hr/day rehab nursing? Yes 4. Does the patient require coordinated care of a physician, rehab nurse, PT (1-2 hrs/day, 5 days/week) and OT (1-2 hrs/day, 5 days/week) to address physical and functional deficits in the context of the above medical diagnosis(es)? Yes Addressing deficits in the following areas: balance, endurance, locomotion, strength, transferring, bowel/bladder control, bathing, dressing, feeding, grooming, toileting and psychosocial support 5. Can the patient actively participate in an intensive therapy program of at least 3 hrs of therapy per day at least 5 days per week? Yes 6. The potential for patient to make measurable gains while on inpatient rehab is excellent 7. Anticipated functional outcomes upon discharge from inpatient rehab are modified independent  with PT, modified independent with OT, n/a with SLP. 8. Estimated rehab length of stay to reach the above functional goals is: 7 days potentially 9. Does the patient have adequate  social supports and living environment to accommodate these discharge functional goals? Yes 10. Anticipated D/C setting: Home 11. Anticipated post D/C treatments: [place] therapy 12. Overall Rehab/Functional Prognosis: excellent  RECOMMENDATIONS: This patient's condition is appropriate for continued rehabilitative care in the following setting: CIR, HH Therapy and Outpatient Therapy Patient has agreed to participate in recommended program. Potentially Note that insurance prior authorization may be required for reimbursement for recommended care.  Comment: Will follow along as headaches,nausea, vomiting, dizziness improve. I suspect that he ultimately may only need outpt vs HH services. Rehab Admissions Coordinator to follow up.  Thanks,  Meredith Staggers, MD, Mellody Drown     07/29/2014

## 2014-07-29 NOTE — Evaluation (Deleted)
Speech Language Pathology Evaluation Patient Details Name: Adrian Neal MRN: [id number] DOB: August 14, 1965 Today's Date: 07/29/2014 Time: 1020-1040 SLP Time Calculation (min) (ACUTE ONLY): 20 min  Problem List:  Patient Active Problem List   Diagnosis Date Noted  . 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 Intractable nausea and vomiting 07/27/2014  . Vertigo 07/27/2014  . Essential hypertension 07/27/2014  . Type 1 diabetes mellitus 05/29/2010  . Hyperlipidemia 05/29/2010  . HYPERTENSION 05/29/2010  . 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 Hypertension   . Diabetes mellitus without complication    Past Surgical History:  Past Surgical History  Procedure Laterality Date  . Eye surgery     HPI:  49 y.o. male with a history of Type I DM with an Insulin Pump, HTN, and Hyperlipidemia who presents to the ED with complaints of sudden onset of Dizziness with N+V . He was at work when it occurred. He was brought to the ED and evaluated and placed on Anti-Emetics with minimal relief of his symptoms. He was referred for medical admission.    Assessment / Plan / Recommendation Clinical Impression  Patient evaluated for speech-language and cognitive abilities and as per this assessment, he is within normal limits. No deficits noted in cognitive function, memory, reasoning, awareness, and no s/s of speech deficits. Patient's primary issue appears to be vertigo and he stated he has not had much of an appetite and was observed with uncontrolable hiccups and was lying down flat in bed with eyes closed.     SLP Assessment  Patient does not need any further Speech Lanaguage Pathology Services    Follow Up Recommendations  None    Frequency and Duration     N/A   Pertinent Vitals/Pain Pain Assessment: No/denies pain   SLP Goals     SLP Evaluation Prior Functioning  Cognitive/Linguistic Baseline: Within  functional limits Type of Home: House Available Help at Discharge: Family;Available 24 hours/day   Cognition  Overall Cognitive Status: Within Functional Limits for tasks assessed Orientation Level: Oriented X4 Memory: Appears intact Awareness: Appears intact Problem Solving: Appears intact Safety/Judgment: Appears intact    Comprehension  Auditory Comprehension Overall Auditory Comprehension: Appears within functional limits for tasks assessed    Expression Expression Primary Mode of Expression: Verbal Verbal Expression Overall Verbal Expression: Appears within functional limits for tasks assessed Written Expression Dominant Hand: Right Written Expression: Not tested   Oral / Motor Oral Motor/Sensory Function Overall Oral Motor/Sensory Function: Appears within functional limits for tasks assessed Motor Speech Overall Motor Speech: Appears within functional limits for tasks assessed   GO     Adrian Neal 07/29/2014, 1:20 PM  Adrian Baller, MA, CCC-SLP 07/29/2014 1:20 PM

## 2014-07-29 NOTE — Evaluation (Signed)
Speech Language Pathology Evaluation Patient Details Name: Adrian Neal MRN: [id number] DOB: [birth year] Today's Date: 07/29/2014 Time: 1020-1040 SLP Time Calculation (min) (ACUTE ONLY): 20 min  Problem List:  Patient Active Problem List   Diagnosis Date Noted  . 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 Intractable nausea and vomiting 07/27/2014  . Vertigo 07/27/2014  . Essential hypertension 07/27/2014  . Type 1 diabetes mellitus 05/29/2010  . Hyperlipidemia 05/29/2010  . HYPERTENSION 05/29/2010  . 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 Hypertension   . Diabetes mellitus without complication    Past Surgical History:  Past Surgical History  Procedure Laterality Date  . Eye surgery     HPI:  49 y.o. male with a history of Type I DM with an Insulin Pump, HTN, and Hyperlipidemia who presents to the ED with complaints of sudden onset of Dizziness with N+V . He was at work when it occurred. He was brought to the ED and evaluated and placed on Anti-Emetics with minimal relief of his symptoms. He was referred for medical admission.    Assessment / Plan / Recommendation Clinical Impression  Patient evaluated for speech-language and cognitive abilities and as per this assessment, he is within normal limits. No deficits noted in cognitive function, memory, reasoning, awareness, and no s/s of speech deficits. Patient's primary issue appears to be vertigo and he stated he has not had much of an appetite and was observed with uncontrolable hiccups and was lying down flat in bed with eyes closed.     SLP Assessment  Patient does not need any further Speech Lanaguage Pathology Services    Follow Up Recommendations  None    Frequency and Duration   N/A     Pertinent Vitals/Pain Pain Assessment: No/denies pain   SLP Goals     SLP Evaluation Prior Functioning  Cognitive/Linguistic Baseline: Within  functional limits Type of Home: House Available Help at Discharge: Family;Available 24 hours/day   Cognition  Overall Cognitive Status: Within Functional Limits for tasks assessed Orientation Level: Oriented X4 Memory: Appears intact Awareness: Appears intact Problem Solving: Appears intact Safety/Judgment: Appears intact    Comprehension  Auditory Comprehension Overall Auditory Comprehension: Appears within functional limits for tasks assessed    Expression Expression Primary Mode of Expression: Verbal Verbal Expression Overall Verbal Expression: Appears within functional limits for tasks assessed Written Expression Dominant Hand: Right Written Expression: Not tested   Oral / Motor Oral Motor/Sensory Function Overall Oral Motor/Sensory Function: Appears within functional limits for tasks assessed Motor Speech Overall Motor Speech: Appears within functional limits for tasks assessed   GO     Adrian Neal 07/29/2014, 1:23 PM  Sonia Baller, MA, CCC-SLP 07/29/2014 1:24 PM

## 2014-07-29 NOTE — Progress Notes (Signed)
Rehab Admissions Coordinator Note:  Patient was screened by Annya Lizana L for appropriateness for an Inpatient Acute Rehab Consult.  At this time, we are recommending Inpatient Rehab consult.  Shalice Woodring L 07/29/2014, 10:46 AM  I can be reached at [phone].

## 2014-07-29 NOTE — Progress Notes (Signed)
STROKE TEAM PROGRESS NOTE   HISTORY Adrian Neal is a 49 y.o. male with known HTN and DM. Patient was at work yesterday when he suddenly felt dizzy and then became nauseated. He attempted to walk and sit down and noted he was falling to the left. He sat down in hopes it would pass but then noted the room was spinning to the left. He called EMS and was brought to ED. Today he feels his symptoms of nausea have decreased and he is more stable on his feet.  He feels he has been under more stress at work and admits his A1c has been around 8.0.  Date last known well: Date: 07/27/2014 Time last known well: Time: 13:30 tPA Given: No: out of window Modified Rankin: Rankin Score=0   SUBJECTIVE (INTERVAL HISTORY) Wife at bedside. Patient stated that he still has some nausea but no vomiting. Still feels some incoordination of right side. Patient's wife reports that the patient snores; however, she has not noted any periods of apnea.   OBJECTIVE Temp:  [97.5 F (36.4 C)-99.1 F (37.3 C)] 97.5 F (36.4 C) (04/01 1030) Pulse Rate:  [104-111] 104 (04/01 1030) Cardiac Rhythm:  [-] Sinus tachycardia (03/31 1930) Resp:  [16-18] 18 (04/01 1030) BP: (131-184)/(42-67) 170/67 mmHg (04/01 1359) SpO2:  [97 %-100 %] 100 % (04/01 1030)   Recent Labs Lab 07/28/14 2327 07/29/14 0012 07/29/14 0405 07/29/14 0731 07/29/14 1209  GLUCAP 423* 453* 390* 295* 395*    Recent Labs Lab 07/27/14 1638 07/28/14 0656 07/29/14 0536  NA 142 142 135  K 3.9 3.6 4.0  CL 108 109 105  CO2 24 19 17*  GLUCOSE 187* 319* 372*  BUN 24* 27* 54*  CREATININE 1.58* 2.10* 2.61*  CALCIUM 9.1 8.9 8.8  MG  --   --  2.0    Recent Labs Lab 07/28/14 1036 07/29/14 0536  AST 20 29  ALT 24 22  ALKPHOS 88 94  BILITOT 1.7* 1.6*  PROT 6.1 5.6*  ALBUMIN 3.4* 3.2*    Recent Labs Lab 07/27/14 1638 07/28/14 0656 07/29/14 0536  WBC 13.3* 14.2* 21.5*  HGB 15.2 13.8 15.4  HCT 43.3 42.8 44.8  MCV 84.9 87.7 87.0   PLT 124* 134* 161   No results for input(s): CKTOTAL, CKMB, CKMBINDEX, TROPONINI in the last 168 hours. No results for input(s): LABPROT, INR in the last 72 hours.  Recent Labs  07/27/14 1715 07/29/14 1346  COLORURINE YELLOW YELLOW  LABSPEC 1.010 1.017  PHURINE 7.5 5.0  GLUCOSEU 250* >1000*  HGBUR NEGATIVE NEGATIVE  BILIRUBINUR NEGATIVE NEGATIVE  KETONESUR NEGATIVE 15*  PROTEINUR 100* 100*  UROBILINOGEN 0.2 0.2  NITRITE NEGATIVE NEGATIVE  LEUKOCYTESUR NEGATIVE NEGATIVE       Component Value Date/Time   CHOL 134 07/28/2014 1036   TRIG 66 07/28/2014 1036   HDL 51 07/28/2014 1036   CHOLHDL 2.6 07/28/2014 1036   VLDL 13 07/28/2014 1036   LDLCALC 70 07/28/2014 1036   Lab Results  Component Value Date   HGBA1C 8.1* 07/28/2014   No results found for: LABOPIA, COCAINSCRNUR, LABBENZ, AMPHETMU, THCU, LABBARB  No results for input(s): ETH in the last 168 hours.  I have personally reviewed the radiological images below and agree with the radiology interpretations.  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  Otherwise normal MRI of the brain without contrast for age.  Mild paranasal sinusitis, trace LEFT mastoid effusion.     US  Renal 07/28/2014    Negative.    Dg Chest Port 1 View 07/28/2014    No active disease.   CUS - Bilateral: 1-39% ICA stenosis. Vertebral artery flow is antegrade.  2D echo - - Left ventricle: The cavity size was normal. Wall thickness was increased in a pattern of mild LVH. Systolic function was normal. The estimated ejection fraction was in the range of 60% to 65%. Wall motion was normal; there were no regional wall motion abnormalities. Features are consistent with a pseudonormal left ventricular filling pattern, with concomitant abnormal relaxation and increased filling pressure (grade 2 diastolic dysfunction).  PHYSICAL EXAM  Temp:  [97.5 F (36.4 C)-99.1 F (37.3 C)] 97.5 F (36.4  C) (04/01 1030) Pulse Rate:  [104-111] 104 (04/01 1030) Resp:  [16-18] 18 (04/01 1030) BP: (131-184)/(42-67) 170/67 mmHg (04/01 1359) SpO2:  [97 %-100 %] 100 % (04/01 1030)  General - Well nourished, well developed, in no apparent distress.  Ophthalmologic - Sharp disc margins OU.  Cardiovascular - Regular rate and rhythm with no murmur.  Mental Status -  Level of arousal and orientation to time, place, and person were intact. Language including expression, naming, repetition, comprehension was assessed and found intact. Attention span and concentration were normal. Recent and remote memory were intact. Fund of Knowledge was assessed and was intact.  Cranial Nerves II - XII - II - Visual field intact OU. III, IV, VI - Extraocular movements intact. V - Facial sensation intact bilaterally. VII - Facial movement intact bilaterally. VIII - Hearing & vestibular intact bilaterally, no nystagmus. X - Palate elevates symmetrically. XI - Chin turning & shoulder shrug intact bilaterally. XII - Tongue protrusion intact.  Motor Strength - The patient's strength was normal in all extremities and pronator drift was absent.  Bulk was normal and fasciculations were absent.   Motor Tone - Muscle tone was assessed at the neck and appendages and was normal.  Reflexes - The patient's reflexes were 1+ in all extremities and he had no pathological reflexes.  Sensory - Light touch, temperature/pinprick, vibration and proprioception, and Romberg testing were assessed and were symmetrical.    Coordination - The patient had dysmetria on left finger-to-nose testing and the left abnormal rapid alternating movements, heel-to-shin bilaterally intact.  Tremor was absent.  Gait and Station - deferred due to safety concerns.  ASSESSMENT/PLAN Adrian Neal is a 49 y.o. male with history of hypertension, diabetes, and hyperlipidemia presenting with nausea, vomiting, and dizziness. He did not receive  IV t-PA due to late presentation.   Stroke:  Dominant - Acute punctate LEFT cerebellar peduncle infarct.   Resultant  left upper extremity dysmetria  MRI - Acute punctate LEFT cerebellar peduncle infarct.   MRA - not performed  Carotid Doppler - Preliminary findings: Bilateral: 1-39% ICA stenosis. Vertebral artery flow is antegrade.   2D Echo - LVH  LDL - 70  HgbA1c - 8.1, not at goal  Lovenox for VTE prophylaxis Diet heart healthy/carb modified Room service appropriate?: Yes; Fluid consistency:: Thin  no antithrombotic prior to admission, now on aspirin 325 mg orally every day. Continue aspirin for stroke prevention.  Patient counseled to be compliant with his antithrombotic medications  Ongoing aggressive stroke risk factor management  Therapy recommendations: Pending  Disposition: Pending  Hypertension  Home meds:  Ramipril and metoprolol  Permissive hypertension <220/120 for 24-48 hours and then gradually normalize within 5-7 days  On metoprolol  BP was 160s-180s currently  Hyperlipidemia  Home meds:  Zocor 40 mg daily resumed in hospital  LDL 70, goal < 70  Continue statin at discharge  Diabetes  HgbA1c 8.1, goal < 7.0  Uncontrolled  Home with insulin pump  Currently on Lantus  SSI  DM education  Management as per primary team  Other Stroke Risk Factors  Cigarette smoker, quit smoking 23 years ago.   Other Active Problems  Acute renal failure - BUN - 54 Creatinine - 2.61 today. Renal US - yesterday - negative.  Leukocytosis - 21.5 K today - afebrile  Hospital day # [place] PA-C [facility] Pager [phone] 07/29/2014, 2:41 PM  I, the attending vascular neurologist, have personally obtained a history, examined the patient, evaluated laboratory data, individually viewed imaging studies and agree with radiology interpretations. I also obtained additional history from pt's wife at bedside. Together with the  NP/PA, we formulated the assessment and plan of care which reflects our mutual decision.  I have made any additions or clarifications directly to the above note and agree with the findings and plan as currently documented.   49 year old male with history of hypertension, diabetes, hyperlipidemia was admitted for left cerebellar peduncle punctate stroke. Carotid Doppler and 2-D echo and remarkable. A1c 8.1, glucose not in control. Blood pressure also high on metoprolol. LDL controlled well with Zocor. Recommend to continue aspirin and Zocor for stroke prevention. Progressive measurement for stroke risk factors including diabetes, hypertension. Patient will need outpatient sleep study to rule out OSA.  Neurology will sign off. Please call with questions. Pt will follow up with Dr. Erlinda Hong at [facility] in about 2 months. Thanks for the consult.  Adrian Hawking, MD PhD Stroke Neurology 07/29/2014 6:47 PM        To contact Stroke Continuity provider, please refer to [email or web address] After hours, contact General Neurology

## 2014-07-30 LAB — COMPREHENSIVE METABOLIC PANEL
ALT: 21 U/L (ref 0–53)
AST: 21 U/L (ref 0–37)
Albumin: 2.8 g/dL — ABNORMAL LOW (ref 3.5–5.2)
Alkaline Phosphatase: 78 U/L (ref 39–117)
Anion gap: 4 — ABNORMAL LOW (ref 5–15)
BUN: 42 mg/dL — AB (ref 6–23)
CHLORIDE: 109 mmol/L (ref 96–112)
CO2: 26 mmol/L (ref 19–32)
Calcium: 8.6 mg/dL (ref 8.4–10.5)
Creatinine, Ser: 2.17 mg/dL — ABNORMAL HIGH (ref 0.50–1.35)
GFR, EST AFRICAN AMERICAN: 40 mL/min — AB (ref >90)
GFR, EST NON AFRICAN AMERICAN: 34 mL/min — AB (ref >90)
GLUCOSE: 141 mg/dL — AB (ref 70–99)
POTASSIUM: 4.1 mmol/L (ref 3.5–5.1)
SODIUM: 139 mmol/L (ref 135–145)
Total Bilirubin: 1.5 mg/dL — ABNORMAL HIGH (ref 0.3–1.2)
Total Protein: 4.9 g/dL — ABNORMAL LOW (ref 6.0–8.3)

## 2014-07-30 LAB — CBC
HCT: 39.4 % (ref 39.0–52.0)
Hemoglobin: 13.8 g/dL (ref 13.0–17.0)
MCH: 29.6 pg (ref 26.0–34.0)
MCHC: 35 g/dL (ref 30.0–36.0)
MCV: 84.4 fL (ref 78.0–100.0)
PLATELETS: 125 10*3/uL — AB (ref 150–400)
RBC: 4.67 MIL/uL (ref 4.22–5.81)
RDW: 12.9 % (ref 11.5–15.5)
WBC: 15 10*3/uL — ABNORMAL HIGH (ref 4.0–10.5)

## 2014-07-30 LAB — GLUCOSE, CAPILLARY
GLUCOSE-CAPILLARY: 121 mg/dL — AB (ref 70–99)
Glucose-Capillary: 143 mg/dL — ABNORMAL HIGH (ref 70–99)
Glucose-Capillary: 155 mg/dL — ABNORMAL HIGH (ref 70–99)
Glucose-Capillary: 178 mg/dL — ABNORMAL HIGH (ref 70–99)
Glucose-Capillary: 178 mg/dL — ABNORMAL HIGH (ref 70–99)
Glucose-Capillary: 239 mg/dL — ABNORMAL HIGH (ref 70–99)
Glucose-Capillary: 272 mg/dL — ABNORMAL HIGH (ref 70–99)

## 2014-07-30 MED ORDER — METOCLOPRAMIDE HCL 5 MG/ML IJ SOLN
5.0000 mg | Freq: Four times a day (QID) | INTRAMUSCULAR | Status: DC
  Administered 2014-07-30 – 2014-07-31 (×4): 5 mg via INTRAVENOUS
  Filled 2014-07-30 (×8): qty 1

## 2014-07-30 MED ORDER — ENOXAPARIN SODIUM 40 MG/0.4ML ~~LOC~~ SOLN
40.0000 mg | Freq: Every day | SUBCUTANEOUS | Status: DC
  Administered 2014-07-31 – 2014-08-03 (×4): 40 mg via SUBCUTANEOUS
  Filled 2014-07-30 (×5): qty 0.4

## 2014-07-30 MED ORDER — AMLODIPINE BESYLATE 5 MG PO TABS
5.0000 mg | ORAL_TABLET | Freq: Every day | ORAL | Status: DC
  Administered 2014-07-30 – 2014-07-31 (×2): 5 mg via ORAL
  Filled 2014-07-30 (×2): qty 1

## 2014-07-30 MED ORDER — SODIUM CHLORIDE 0.9 % IV SOLN
12.5000 mg | Freq: Three times a day (TID) | INTRAVENOUS | Status: DC | PRN
  Administered 2014-07-30: 12.5 mg via INTRAVENOUS
  Filled 2014-07-30 (×3): qty 0.5

## 2014-07-30 NOTE — Progress Notes (Signed)
PROGRESS NOTE  Adrian Neal [id number] DOB: 07-25-1965 DOA: 07/27/2014 PCP: No primary care provider on file.  HPI/Recap of past 24 hours: Reported feeling slightly better, more pleasant, reported hiccups but less vomiting, wife at bedside Less tachycardia.  Assessment/Plan: Principal Problem:   Vertigo Active Problems:   Type 1 diabetes mellitus   Hyperlipidemia   Intractable nausea and vomiting   Essential hypertension   Cerebral thrombosis with cerebral infarction   ARF (acute renal failure)   Insulin dependent diabetes mellitus  Acute CVA: mri showed 30mm acute LEFT mesial brachium pontis infarct On tele, carotid US unremarkable, echocardiogram lVH/LVEF wnl/grade II diastolic dysfunction. allow permissive htn for 2 days. Add norvasc on 4/2 for better bp control. Continue lopressor. ldl 70, TSH wnl, a1c 8.1, high dose asa for now, neurology consulted. CIR admission?  N/v/dizziness: from CVA? Symptomatic control, prn reglan, meclizine. Ivf. Persistent n/v but less, component of diabetic gastroparesis? reglan trial  DM1, a1c 8.1, patient want to use insulin pump here, diabetes nurse to coordinate insulin pump use here.  Htn: allow permissive htn x2days. Continue lopressor, add norvasc.  ARF, unknown baseline. ua unremarkable, Likely from dehydration, could have underline ckd from longstanding diabetes. Renal US negative. Hydration, renal dosing meds. Worsening of cr today, repeat ua again concentrated urine, no infection, ct ab no acute finding either. Will continue ivf. Cr slightly better  dvt prophlaxis: lovenox 30mg  sub Q qd  Code Status: full  Family Communication: patient and wife  Disposition Plan: remain in patient   Consultants:  Neurology/CIR  Procedures:  Mri/echo  Antibiotics:  none   Objective: BP 183/74 mmHg  Pulse 88  Temp(Src) 98.2 F (36.8 C) (Oral)  Resp 20  Ht 5\' 8"  (1.727 m)  Wt 76.8 kg (169 lb 5 oz)  BMI 25.75 kg/m2  SpO2  100%  Intake/Output Summary (Last 24 hours) at 07/30/14 1923 Last data filed at 07/30/14 1300  Gross per 24 hour  Intake   1959 ml  Output    900 ml  Net   1059 ml   Filed Weights   07/28/14 0018  Weight: 76.8 kg (169 lb 5 oz)    Exam:   General:  No distress, smiling  Cardiovascular: NSR  Respiratory: CTABL  Abdomen: Soft/ND/NT, positive BS  Musculoskeletal: No Edema  Neuro: no obvious focal deficit.  Data Reviewed: Basic Metabolic Panel:  Recent Labs Lab 07/27/14 1638 07/28/14 0656 07/29/14 0536 07/30/14 0620  NA 142 142 135 139  K 3.9 3.6 4.0 4.1  CL 108 109 105 109  CO2 24 19 17* 26  GLUCOSE 187* 319* 372* 141*  BUN 24* 27* 54* 42*  CREATININE 1.58* 2.10* 2.61* 2.17*  CALCIUM 9.1 8.9 8.8 8.6  MG  --   --  2.0  --    Liver Function Tests:  Recent Labs Lab 07/28/14 1036 07/29/14 0536 07/30/14 0620  AST 20 29 21   ALT 24 22 21   ALKPHOS 88 94 78  BILITOT 1.7* 1.6* 1.5*  PROT 6.1 5.6* 4.9*  ALBUMIN 3.4* 3.2* 2.8*    Recent Labs Lab 07/27/14 1638  LIPASE 22   No results for input(s): AMMONIA in the last 168 hours. CBC:  Recent Labs Lab 07/27/14 1638 07/28/14 0656 07/29/14 0536 07/30/14 0620  WBC 13.3* 14.2* 21.5* 15.0*  HGB 15.2 13.8 15.4 13.8  HCT 43.3 42.8 44.8 39.4  MCV 84.9 87.7 87.0 84.4  PLT 124* 134* 161 125*   Cardiac Enzymes:   No results for  input(s): CKTOTAL, CKMB, CKMBINDEX, TROPONINI in the last 168 hours. BNP (last 3 results) No results for input(s): BNP in the last 8760 hours.  ProBNP (last 3 results) No results for input(s): PROBNP in the last 8760 hours.  CBG:  Recent Labs Lab 07/30/14 0034 07/30/14 0350 07/30/14 0811 07/30/14 1159 07/30/14 1709  GLUCAP 272* 143* 178* 239* 178*    No results found for this or any previous visit (from the past 240 hour(s)).   Studies: Ct Abdomen Pelvis Wo Contrast  07/29/2014   CLINICAL DATA:  Abdominal pain with nausea and vomiting  EXAM: CT ABDOMEN AND PELVIS  WITHOUT CONTRAST  TECHNIQUE: Multidetector CT imaging of the abdomen and pelvis was performed following the standard protocol without IV contrast.  COMPARISON:  Ultrasound from the previous day.  FINDINGS: The lung bases are free of acute infiltrate or sizable effusion.  The liver, gallbladder, spleen, adrenal glands and pancreas are within normal limits. The kidneys are well visualized bilaterally. No renal calculi or obstructive changes are seen. The appendix is well visualized and within normal limits. The bladder is well distended. No pelvic mass lesion or sidewall abnormality is noted. Mild aortoiliac calcifications are noted. No other focal abnormality is seen.  IMPRESSION: No acute abnormality noted.   Electronically Signed   By: Inez Catalina M.D.   On: 07/29/2014 13:30    Scheduled Meds: . amLODipine  5 mg Oral Daily  . aspirin EC  325 mg Oral Daily  . enoxaparin (LOVENOX) injection  40 mg Subcutaneous Daily  . insulin aspart  0-9 Units Subcutaneous 6 times per day  . insulin glargine  30 Units Subcutaneous QHS  . insulin pump   Subcutaneous TID AC, HS, 0200  . metoCLOPramide (REGLAN) injection  5 mg Intravenous 4 times per day  . metoprolol  5 mg Intravenous 4 times per day  . omega-3 acid ethyl esters  1 g Oral BID  . simvastatin  40 mg Oral Daily  . sodium chloride  3 mL Intravenous Q12H    Continuous Infusions: . sodium chloride 100 mL/hr at 07/29/14 2256     Time spent: 74mins  Grier Vu MD, PhD  [facility] Pager [phone]. If 7PM-7AM, please contact night-coverage at [email or web address], password [email or web address] 07/30/2014, 7:23 PM  LOS: 2 days

## 2014-07-30 NOTE — Evaluation (Signed)
Occupational Therapy Evaluation Patient Details Name: Adrian Neal MRN: [id number] DOB: [birth year] Today's Date: 07/30/2014    History of Present Illness Pt. was admitted 07/27/14 with dizziness and nausea, falling toward left.   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  Pt. with PMH of poorly controlled diabetes (he reports being diabetic since age 49) and HTN.  Pt. wears an insulin pump normally.   Clinical Impression   Pt admitted with above. He demonstrates the below listed deficits and will benefit from continued OT to maximize safety and independence with BADLs. Pt presents to OT with Lt sided ataxia, diplopia, nystagmus, dizziness, impaired balance, impaired coordination Lt. UE and trunk.   Currently, he requires min - mod A for BADLs, and is a very high risk for falls and injury at this time.   Recommend CIR for intensive rehab prior to discharge home, to reduce fall and risk of injury as well as to increase his independence with BADLs.       Follow Up Recommendations  CIR;Supervision/Assistance - 24 hour    Equipment Recommendations  3 in 1 bedside comode;Tub/shower bench    Recommendations for Other Services Rehab consult     Precautions / Restrictions Precautions Precautions: Fall      Mobility Bed Mobility Overal bed mobility: Needs Assistance Bed Mobility: Supine to Sit     Supine to sit: Min guard     General bed mobility comments: Pt with complaint of dizziness with associated nausea with supine to sit.  Pt instructed in gaze fixation.  He is reliant on UE support to maintain balance.   Transfers Overall transfer level: Needs assistance Equipment used: Rolling walker (2 wheeled) Transfers: Sit to/from [facility] Sit to Stand: Min assist Stand pivot transfers: Mod assist;Min assist       General transfer comment: Pt reliant on bil. UE support.  With UE support, he requires min A, but without, he requires mod A as he pitches  heavily to Lt. losing balance    Balance Overall balance assessment: Needs assistance Sitting-balance support: Feet supported Sitting balance-Leahy Scale: Poor Sitting balance - Comments: requires UE support    Standing balance support: During functional activity;Single extremity supported Standing balance-Leahy Scale: Poor                              ADL Overall ADL's : Needs assistance/impaired Eating/Feeding: Sitting;Set up   Grooming: Wash/dry hands;Wash/dry face;Oral care;Minimal assistance;Standing Grooming Details (indicate cue type and reason): Reliant on UE support for balance.  Pt leans forward on elbow on counter of sink to support self.  occasional LOB Upper Body Bathing: Minimal assitance;Sitting   Lower Body Bathing: Moderate assistance;Sit to/from stand   Upper Body Dressing : Minimal assistance;Sitting   Lower Body Dressing: Moderate assistance;Sit to/from stand   Toilet Transfer: Moderate assistance;Ambulation;Comfort height toilet;Grab bars;RW Toilet Transfer Details (indicate cue type and reason): Pt requires increased time, due to ataxia and dizziness.  He pitches to Lt. when turning corners requiring mod A to recover and prevent fall  Toileting- Clothing Manipulation and Hygiene: Moderate assistance;Sit to/from stand       Functional mobility during ADLs: Moderate assistance;Minimal assistance;Rolling walker General ADL Comments: Pt with significant ataxia and persistent dizziness.  He requires UE support to stabilize trunk and maintain balance.  If pt releases UEs, he loses balance heavily to Lt.      Vision Vision Assessment?: Yes Ocular Range  of Motion: Within Functional Limits Tracking/Visual Pursuits: Decreased smoothness of horizontal tracking;Decreased smoothness of vertical tracking Visual Fields: No apparent deficits Additional Comments: Pt squints both eyes - he reports this is his baseline.  Pt. with nystagmus with all eye  movements.  He reports diplopia with Rt gaze.  He has a h/o retinopathy Rt eye with decreased acuity   Perception Perception Perception Tested?: Yes   Praxis Praxis Praxis tested?: Within functional limits    Pertinent Vitals/Pain Pain Assessment: No/denies pain     Hand Dominance Right   Extremity/Trunk Assessment Upper Extremity Assessment Upper Extremity Assessment: LUE deficits/detail LUE Deficits / Details: ataxia Lt UE LUE Sensation: decreased proprioception LUE Coordination: decreased fine motor;decreased gross motor   Lower Extremity Assessment Lower Extremity Assessment: Defer to PT evaluation   Cervical / Trunk Assessment Cervical / Trunk Assessment: Other exceptions Cervical / Trunk Exceptions: trunk ataxia noted with difficulty isolating movements of trunk    Communication Communication Communication: No difficulties   Cognition Arousal/Alertness: Awake/alert Behavior During Therapy: WFL for tasks assessed/performed Overall Cognitive Status: Within Functional Limits for tasks assessed                     General Comments       Exercises       Shoulder Instructions      Home Living Family/patient expects to be discharged to:: Private residence Living Arrangements: Children;Spouse/significant other (kids 6. 15 and 18) Available Help at Discharge: Family;Available 24 hours/day Type of Home: House Home Access: Level entry     Home Layout: Two level Alternate Level Stairs-Number of Steps: flight Alternate Level Stairs-Rails: Right;Left;Can reach both Bathroom Shower/Tub: Tub/shower unit;Curtain Shower/tub characteristics: Architectural technologist: Standard     Home Equipment: None          Prior Functioning/Environment Level of Independence: Independent        Comments: drives, works as  Freight forwarder at [employer] in [place]    OT Diagnosis: Generalized weakness;Disturbance of vision;Ataxia   OT Problem List: Decreased  activity tolerance;Impaired balance (sitting and/or standing);Impaired vision/perception;Decreased coordination;Decreased safety awareness;Decreased knowledge of use of DME or AE;Impaired tone;Impaired sensation;Impaired UE functional use   OT Treatment/Interventions: Self-care/ADL training;Neuromuscular education;DME and/or AE instruction;Therapeutic activities;Visual/perceptual remediation/compensation;Patient/family education;Balance training    OT Goals(Current goals can be found in the care plan section) Acute Rehab OT Goals Patient Stated Goal: to get better  OT Goal Formulation: With patient Time For Goal Achievement: 08/13/14 Potential to Achieve Goals: Good ADL Goals Pt Will Perform Grooming: standing;with min guard assist Pt Will Perform Upper Body Bathing: with set-up;sitting Pt Will Perform Lower Body Bathing: with min guard assist;sit to/from stand Pt Will Perform Upper Body Dressing: with set-up;sitting Pt Will Perform Lower Body Dressing: with min guard assist;sit to/from stand Pt Will Transfer to Toilet: with min guard assist;ambulating;bedside commode;grab bars;regular height toilet Pt Will Perform Toileting - Clothing Manipulation and hygiene: with min guard assist;sit to/from stand Pt/caregiver will Perform Home Exercise Program: With theraband;With written HEP provided;With Supervision (to improve coordination bil. UEs) Additional ADL Goal #1: Pt will be independent with gaze fization/stabillzation strategies to reduce dizziness   OT Frequency: Min 3X/week   Barriers to D/C:            Co-evaluation              End of Session Equipment Utilized During Treatment: Rolling walker Nurse Communication: Mobility status  Activity Tolerance: Patient tolerated treatment well Patient left: in chair;with call bell/phone within  reach;with chair alarm set   Time: [phone] OT Time Calculation (min): 38 min Charges:  OT General Charges $OT Visit: 1 Procedure OT  Evaluation $Initial OT Evaluation Tier I: 1 Procedure OT Treatments $Self Care/Home Management : 23-37 mins G-Codes:    Atleigh Gruen M 2014/08/23, 9:06 AM

## 2014-07-30 NOTE — Progress Notes (Signed)
Physical Therapy Treatment Patient Details Name: Adrian Neal MRN: [id number] DOB: 08/22/1965 Today's Date: 07/30/2014    History of Present Illness Pt. was admitted 07/27/14 with dizziness and nausea, falling toward left.   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  Pt. with PMH of poorly controlled diabetes (he reports being diabetic since age 49) and HTN.  Pt. wears an insulin pump normally.    PT Comments    Patient working on setting up in insulin pump when entering room. Trying his best to use LUE as much as possible and appeared to be doing well. Having some decreased coordination. Patient agreeable and motivated to attempted ambulation. Requires +2 assist to ambulate without AD. AD appeared to be causing more confusion than assistance at this time. Patient continues to be a great candidate for CIR. Continue to recommend comprehensive inpatient rehab (CIR) for post-acute therapy needs.   Follow Up Recommendations  CIR     Equipment Recommendations       Recommendations for Other Services       Precautions / Restrictions Precautions Precautions: Fall    Mobility  Bed Mobility Overal bed mobility: Needs Assistance Bed Mobility: Sit to Supine     Supine to sit: Min guard Sit to supine: Min guard   General bed mobility comments: Pt with complaint of dizziness with associated nausea with supine to sit.  Pt instructed in gaze fixation.  He is reliant on UE support to maintain balance.   Transfers Overall transfer level: Needs assistance Equipment used: Rolling walker (2 wheeled) Transfers: Sit to/from [facility] Sit to Stand: Min assist;Mod assist Stand pivot transfers: Mod assist;Min assist       General transfer comment: Pt reliant on bil. UE support.  With UE support, he requires min A, but without, he requires mod A as he pitches heavily to Lt. losing balance  Ambulation/Gait Ambulation/Gait assistance: Min assist;+2 physical  assistance Ambulation Distance (Feet): 40 Feet Assistive device: 2 person hand held assist Gait Pattern/deviations: Ataxic;Narrow base of support;Drifts right/left Gait velocity: decreased   General Gait Details: Patient with decreased coordination of LLE and with narrowed BOS. Cues to widen BOS and to not pitch posteriorly when stepping. Increased Left lean with progression. Limited by fatigue and increased dizziness   Stairs            Wheelchair Mobility    Modified Rankin (Stroke Patients Only)       Balance Overall balance assessment: Needs assistance Sitting-balance support: Feet supported Sitting balance-Leahy Scale: Poor Sitting balance - Comments: requires UE support    Standing balance support: During functional activity;Single extremity supported Standing balance-Leahy Scale: Poor Standing balance comment: Needs out assist for balance in standing                    Cognition Arousal/Alertness: Awake/alert Behavior During Therapy: WFL for tasks assessed/performed Overall Cognitive Status: Within Functional Limits for tasks assessed                      Exercises      General Comments        Pertinent Vitals/Pain Pain Assessment: No/denies pain    Home Living Family/patient expects to be discharged to:: Private residence Living Arrangements: Children;Spouse/significant other (kids 6. 15 and 18) Available Help at Discharge: Family;Available 24 hours/day Type of Home: House Home Access: Level entry   Home Layout: Two level Home Equipment: None      Prior  Function Level of Independence: Independent      Comments: drives, works as  Freight forwarder at [employer] in [place]   PT Goals (current goals can now be found in the care plan section) Acute Rehab PT Goals Patient Stated Goal: to get better  Progress towards PT goals: Progressing toward goals    Frequency  Min 4X/week    PT Plan Current plan remains appropriate     Co-evaluation             End of Session Equipment Utilized During Treatment: Gait belt Activity Tolerance: Patient tolerated treatment well Patient left: in bed;with call bell/phone within reach;with family/visitor present     Time: [id number] PT Time Calculation (min) (ACUTE ONLY): 30 min  Charges:  $Gait Training: 8-22 mins $Therapeutic Activity: 8-22 mins                    G Codes:      Jacqualyn Posey 07/30/2014, 10:18 AM 07/30/2014 Jacqualyn Posey PTA [phone] pager [phone] office

## 2014-07-31 LAB — GLUCOSE, CAPILLARY
GLUCOSE-CAPILLARY: 157 mg/dL — AB (ref 70–99)
GLUCOSE-CAPILLARY: 73 mg/dL (ref 70–99)
Glucose-Capillary: 103 mg/dL — ABNORMAL HIGH (ref 70–99)
Glucose-Capillary: 181 mg/dL — ABNORMAL HIGH (ref 70–99)
Glucose-Capillary: 158 mg/dL — ABNORMAL HIGH (ref 70–99)
Glucose-Capillary: 99 mg/dL (ref 70–99)

## 2014-07-31 LAB — HEPATIC FUNCTION PANEL
ALT: 40 U/L (ref 0–53)
AST: 38 U/L — ABNORMAL HIGH (ref 0–37)
Albumin: 2.7 g/dL — ABNORMAL LOW (ref 3.5–5.2)
Alkaline Phosphatase: 74 U/L (ref 39–117)
BILIRUBIN INDIRECT: 0.8 mg/dL (ref 0.3–0.9)
Bilirubin, Direct: 0.3 mg/dL (ref 0.0–0.5)
TOTAL PROTEIN: 4.8 g/dL — AB (ref 6.0–8.3)
Total Bilirubin: 1.1 mg/dL (ref 0.3–1.2)

## 2014-07-31 MED ORDER — CARVEDILOL 25 MG PO TABS
25.0000 mg | ORAL_TABLET | Freq: Two times a day (BID) | ORAL | Status: DC
  Administered 2014-07-31 – 2014-08-03 (×7): 25 mg via ORAL
  Filled 2014-07-31 (×8): qty 1

## 2014-07-31 MED ORDER — AMLODIPINE BESYLATE 10 MG PO TABS
10.0000 mg | ORAL_TABLET | Freq: Every day | ORAL | Status: DC
  Administered 2014-08-01 – 2014-08-03 (×3): 10 mg via ORAL
  Filled 2014-07-31 (×5): qty 1

## 2014-07-31 MED ORDER — METOCLOPRAMIDE HCL 5 MG/ML IJ SOLN
10.0000 mg | Freq: Four times a day (QID) | INTRAMUSCULAR | Status: DC
Start: 2014-07-31 — End: 2014-08-03
  Administered 2014-07-31 – 2014-08-03 (×11): 10 mg via INTRAVENOUS
  Filled 2014-07-31 (×15): qty 2

## 2014-07-31 MED ORDER — ATORVASTATIN CALCIUM 20 MG PO TABS
20.0000 mg | ORAL_TABLET | Freq: Every day | ORAL | Status: DC
  Administered 2014-08-01 – 2014-08-03 (×3): 20 mg via ORAL
  Filled 2014-07-31 (×3): qty 1

## 2014-07-31 MED ORDER — METOPROLOL TARTRATE 1 MG/ML IV SOLN
5.0000 mg | Freq: Four times a day (QID) | INTRAVENOUS | Status: DC | PRN
  Administered 2014-07-31: 5 mg via INTRAVENOUS
  Filled 2014-07-31 (×2): qty 5

## 2014-07-31 MED ORDER — HYDRALAZINE HCL 20 MG/ML IJ SOLN
10.0000 mg | Freq: Four times a day (QID) | INTRAMUSCULAR | Status: DC | PRN
  Administered 2014-07-31 – 2014-08-01 (×2): 10 mg via INTRAVENOUS
  Filled 2014-07-31 (×2): qty 1

## 2014-07-31 NOTE — Progress Notes (Signed)
PROGRESS NOTE  COSIMO DOCTOR [id number] DOB: [birth year] DOA: 07/27/2014 PCP: No primary care provider on file.  HPI/Recap of past 24 hours: Reported feeling slightly better, more pleasant, no hiccups, no vomiting, still some nausea, mild dizziness,  tachycardia resolved today. Patient want to take a shower. Multiple family member in room.   Assessment/Plan: Principal Problem:   Vertigo Active Problems:   Type 1 diabetes mellitus   Hyperlipidemia   Intractable nausea and vomiting   Essential hypertension   Cerebral thrombosis with cerebral infarction   ARF (acute renal failure)   Insulin dependent diabetes mellitus  Acute CVA: mri showed 47mm acute LEFT mesial brachium pontis infarct On tele, carotid US unremarkable, echocardiogram lVH/LVEF wnl/grade II diastolic dysfunction. allowed permissive htn for 2 days initially on admission, neurology consulted. ldl 70, TSH wnl, a1c 8.1, started high dose asa from this admission. bp meds adjustment to achieve optimal bp control. started norvasc/coreg, continue hold acei in the setting of arf.  Improving, CIR admission?  N/v/dizziness:  from CVA? Symptomatic control, prn meclizine.  Much improved but still some nausea, component of diabetic gastroparesis? Tolerating low dose reglan, increase to 10mg  qh6r on 4/3, advance diet as tolerated.  DM1, a1c 8.1, patient want to use insulin pump here, diabetes nurse to coordinate insulin pump use here.  Htn:  allowed permissive htn first two days in the hospital. Currently on norvasc/coreg. Continue titrate.  ARF, unknown baseline. ua unremarkable, Likely from dehydration, could have underline ckd from longstanding diabetes. Renal US negative. ct ab no acute finding either. S/p Hydration, renal dosing meds.  Cr slightly better, d/c ivf, continue monitor.  dvt prophlaxis: lovenox 30mg  sub Q qd  Code Status: full  Family Communication: patient and wife  Disposition Plan: remain in  patient   Consultants:  Neurology/CIR  Procedures:  Mri/echo  Antibiotics:  none   Objective: BP 164/92 mmHg  Pulse 95  Temp(Src) 98.4 F (36.9 C) (Oral)  Resp 20  Ht 5\' 8"  (1.727 m)  Wt 76.8 kg (169 lb 5 oz)  BMI 25.75 kg/m2  SpO2 100%  Intake/Output Summary (Last 24 hours) at 07/31/14 1343 Last data filed at 07/31/14 0853  Gross per 24 hour  Intake 3128.33 ml  Output   1750 ml  Net 1378.33 ml   Filed Weights   07/28/14 0018  Weight: 76.8 kg (169 lb 5 oz)    Exam:   General:  No distress, smiling  Cardiovascular: NSR  Respiratory: CTABL  Abdomen: Soft/ND/NT, positive BS  Musculoskeletal: No Edema  Neuro: no obvious focal deficit.  Data Reviewed: Basic Metabolic Panel:  Recent Labs Lab 07/27/14 1638 07/28/14 0656 07/29/14 0536 07/30/14 0620  NA 142 142 135 139  K 3.9 3.6 4.0 4.1  CL 108 109 105 109  CO2 24 19 17* 26  GLUCOSE 187* 319* 372* 141*  BUN 24* 27* 54* 42*  CREATININE 1.58* 2.10* 2.61* 2.17*  CALCIUM 9.1 8.9 8.8 8.6  MG  --   --  2.0  --    Liver Function Tests:  Recent Labs Lab 07/28/14 1036 07/29/14 0536 07/30/14 0620 07/31/14 1100  AST 20 29 21  38*  ALT 24 22 21  40  ALKPHOS 88 94 78 74  BILITOT 1.7* 1.6* 1.5* 1.1  PROT 6.1 5.6* 4.9* 4.8*  ALBUMIN 3.4* 3.2* 2.8* 2.7*    Recent Labs Lab 07/27/14 1638  LIPASE 22   No results for input(s): AMMONIA in the last 168 hours. CBC:  Recent Labs Lab 07/27/14  1638 07/28/14 0656 07/29/14 0536 07/30/14 0620  WBC 13.3* 14.2* 21.5* 15.0*  HGB 15.2 13.8 15.4 13.8  HCT 43.3 42.8 44.8 39.4  MCV 84.9 87.7 87.0 84.4  PLT 124* 134* 161 125*   Cardiac Enzymes:   No results for input(s): CKTOTAL, CKMB, CKMBINDEX, TROPONINI in the last 168 hours. BNP (last 3 results) No results for input(s): BNP in the last 8760 hours.  ProBNP (last 3 results) No results for input(s): PROBNP in the last 8760 hours.  CBG:  Recent Labs Lab 07/30/14 2014 07/31/14 0012  07/31/14 0414 07/31/14 0757 07/31/14 1137  GLUCAP 155* 73 103* 157* 181*    No results found for this or any previous visit (from the past 240 hour(s)).   Studies: No results found.  Scheduled Meds: . [START ON 08/01/2014] amLODipine  10 mg Oral Daily  . aspirin EC  325 mg Oral Daily  . [START ON 08/01/2014] atorvastatin  20 mg Oral q1800  . carvedilol  25 mg Oral BID WC  . enoxaparin (LOVENOX) injection  40 mg Subcutaneous Daily  . insulin aspart  0-9 Units Subcutaneous 6 times per day  . insulin glargine  30 Units Subcutaneous QHS  . insulin pump   Subcutaneous TID AC, HS, 0200  . metoCLOPramide (REGLAN) injection  10 mg Intravenous 4 times per day  . omega-3 acid ethyl esters  1 g Oral BID  . sodium chloride  3 mL Intravenous Q12H    Continuous Infusions:  ivf stopped on 4/2   Time spent: 36mins  Nicholl Onstott MD, PhD  [facility] Pager [phone]. If 7PM-7AM, please contact night-coverage at [email or web address], password [facility] 07/31/2014, 1:43 PM  LOS: 3 days

## 2014-08-01 LAB — GLUCOSE, CAPILLARY
GLUCOSE-CAPILLARY: 166 mg/dL — AB (ref 70–99)
GLUCOSE-CAPILLARY: 166 mg/dL — AB (ref 70–99)
GLUCOSE-CAPILLARY: 86 mg/dL (ref 70–99)
Glucose-Capillary: 134 mg/dL — ABNORMAL HIGH (ref 70–99)
Glucose-Capillary: 191 mg/dL — ABNORMAL HIGH (ref 70–99)
Glucose-Capillary: 167 mg/dL — ABNORMAL HIGH (ref 70–99)

## 2014-08-01 LAB — CBC
HCT: 41.1 % (ref 39.0–52.0)
Hemoglobin: 14.2 g/dL (ref 13.0–17.0)
MCH: 29.5 pg (ref 26.0–34.0)
MCHC: 34.5 g/dL (ref 30.0–36.0)
MCV: 85.3 fL (ref 78.0–100.0)
Platelets: 111 10*3/uL — ABNORMAL LOW (ref 150–400)
RBC: 4.82 MIL/uL (ref 4.22–5.81)
RDW: 12.6 % (ref 11.5–15.5)
WBC: 5.5 10*3/uL (ref 4.0–10.5)

## 2014-08-01 LAB — BASIC METABOLIC PANEL
Anion gap: 8 (ref 5–15)
BUN: 15 mg/dL (ref 6–23)
CHLORIDE: 106 mmol/L (ref 96–112)
CO2: 26 mmol/L (ref 19–32)
Calcium: 8.5 mg/dL (ref 8.4–10.5)
Creatinine, Ser: 1.36 mg/dL — ABNORMAL HIGH (ref 0.50–1.35)
GFR calc Af Amer: 70 mL/min — ABNORMAL LOW (ref >90)
GFR calc non Af Amer: 60 mL/min — ABNORMAL LOW (ref >90)
GLUCOSE: 167 mg/dL — AB (ref 70–99)
Potassium: 3.5 mmol/L (ref 3.5–5.1)
SODIUM: 140 mmol/L (ref 135–145)

## 2014-08-01 LAB — LIPASE, BLOOD: Lipase: 26 U/L (ref 11–59)

## 2014-08-01 MED ORDER — CHLORPROMAZINE HCL 10 MG PO TABS
10.0000 mg | ORAL_TABLET | Freq: Three times a day (TID) | ORAL | Status: DC | PRN
Start: 2014-08-01 — End: 2014-08-03
  Filled 2014-08-01: qty 1

## 2014-08-01 MED ORDER — INSULIN PUMP
SUBCUTANEOUS | Status: DC
  Administered 2014-08-01 (×3): via SUBCUTANEOUS
  Administered 2014-08-01: 6.7 via SUBCUTANEOUS
  Administered 2014-08-02 (×4): via SUBCUTANEOUS
  Administered 2014-08-02: 18.9 via SUBCUTANEOUS
  Administered 2014-08-03: 3 via SUBCUTANEOUS
  Administered 2014-08-03 (×3): via SUBCUTANEOUS
  Filled 2014-08-01: qty 1

## 2014-08-01 MED ORDER — LISINOPRIL 2.5 MG PO TABS
2.5000 mg | ORAL_TABLET | Freq: Every day | ORAL | Status: DC
  Administered 2014-08-01 – 2014-08-03 (×3): 2.5 mg via ORAL
  Filled 2014-08-01 (×3): qty 1

## 2014-08-01 MED ORDER — ISOSORBIDE MONONITRATE ER 30 MG PO TB24
30.0000 mg | ORAL_TABLET | Freq: Every day | ORAL | Status: DC
  Administered 2014-08-01: 30 mg via ORAL
  Filled 2014-08-01: qty 1

## 2014-08-01 MED ORDER — ISOSORBIDE MONONITRATE ER 60 MG PO TB24: 60.0000 mg | ORAL_TABLET | Freq: Every day | ORAL | Status: DC

## 2014-08-01 NOTE — Progress Notes (Signed)
I await PT update today so that I can assess rehab venue needs and how pt has progressed functionally over the weekend. I have contacted P.T. For update. [id number]

## 2014-08-01 NOTE — Progress Notes (Signed)
Discussed with P.T. I will proceed with insurance approval for a possible inpt rehab admission pending their approval and bed availability. [id number]

## 2014-08-01 NOTE — Progress Notes (Signed)
Occupational Therapy Treatment Patient Details Name: Adrian Neal MRN: [id number] DOB: 1965-09-21 Today's Date: 08/01/2014    History of present illness Pt. was admitted 07/27/14 with dizziness and nausea, falling toward left.   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  Pt. with PMH of poorly controlled diabetes (he reports being diabetic since age 49) and HTN.  Pt. wears an insulin pump normally.   OT comments  Pt making progress. Continue to recommend CIR for D/C. Pt very motivated to return to PLOF. Supportive wife present for session. Discussed with wife need to have staff ambulate with pt with use of RW to due pt being a high fall risk secondary to ataxia. Wife verbalized understanding. Nsg/tech aware of mobility needs.   Follow Up Recommendations  CIR;Supervision/Assistance - 24 hour    Equipment Recommendations  3 in 1 bedside comode;Tub/shower bench    Recommendations for Other Services Rehab consult    Precautions / Restrictions Precautions Precautions: Fall       Mobility Bed Mobility Overal bed mobility: Needs Assistance Bed Mobility: Supine to Sit     Supine to sit: Supervision     General bed mobility comments: using gaze stabilization taught earlier  Transfers Overall transfer level: Needs assistance   Transfers: Sit to/from Stand;Stand Pivot Transfers Sit to Stand: Min assist Stand pivot transfers: Mod assist - L bias       General transfer comment: mod a without use of RW. heavy relianve on stabilizing . Unable to maintain midline upright posture without support    Balance Overall balance assessment: Needs assistance   Sitting balance-Leahy Scale: Fair Sitting balance - Comments: able to sustain at midline today wihtout UE suuport. Can not take any challenge     Standing balance-Leahy Scale: Poor Standing balance comment: heavy reliance on RW                   ADL       Grooming: Wash/dry hands;Cueing for  safety;Standing;Minimal assistance (to steady at the sink)                   Toilet Transfer: Minimal assistance;Ambulation;Cueing for safety;Cueing for sequencing Toilet Transfer Details (indicate cue type and reason): Increased time with use of RW. If not using RW, pt requires mod A +2 to ambulate safely due to ataxic gait Toileting- Clothing Manipulation and Hygiene: Minimal assistance;Sit to/from stand (for clothing manipulation)       Functional mobility during ADLs: Minimal assistance;Rolling walker;Cueing for sequencing;Cueing for safety (Mod A +2 without RW) General ADL Comments: significatn ataxia ontinues. Pt states dizziness is a little better.      Vision     Ocular Range of Motion: Within Functional Limits Tracking/Visual Pursuits: Decreased smoothness of horizontal tracking;Decreased smoothness of vertical tracking         Additional Comments: Pt does not c/o diplopia this am. nystagmus appears present. Educated on visual gaze stabilization exercises with and without head movement. "squinty eues" at baseline. Diabetic retinopathy R eye with decreased visual acuity at baseline.    Perception     Praxis      Cognition   Behavior During Therapy: WFL for tasks assessed/performed Overall Cognitive Status: Within Functional Limits for tasks assessed                       Extremity/Trunk Assessment   ataxic LUE. Strength WFL. trunkal ataxia. L bias Able to maintain midline posture with vc  only without BUE support today.            Exercises Other Exercises Other Exercises: resistvie ex with use of theraband (level2) LUE Other Exercises: rythmic stabilization - trunk Other Exercises: gaze stabilization ex   Shoulder Instructions       General Comments  very appreciative of help    Pertinent Vitals/ Pain       Pain Assessment: No/denies pain                                                          Frequency Min  3X/week     Progress Toward Goals  OT Goals(current goals can now be found in the care plan section)  Progress towards OT goals: Progressing toward goals  Acute Rehab OT Goals Patient Stated Goal: to get better  OT Goal Formulation: With patient Time For Goal Achievement: 08/13/14 Potential to Achieve Goals: Good ADL Goals Pt Will Perform Grooming: standing;with min guard assist Pt Will Perform Upper Body Bathing: with set-up;sitting Pt Will Perform Lower Body Bathing: with min guard assist;sit to/from stand Pt Will Perform Upper Body Dressing: with set-up;sitting Pt Will Perform Lower Body Dressing: with min guard assist;sit to/from stand Pt Will Transfer to Toilet: with min guard assist;ambulating;bedside commode;grab bars;regular height toilet Pt Will Perform Toileting - Clothing Manipulation and hygiene: with min guard assist;sit to/from stand Pt/caregiver will Perform Home Exercise Program: With theraband;With written HEP provided;With Supervision Additional ADL Goal #1: Pt will be independent with gaze fization/stabillzation strategies to reduce dizziness   Plan Discharge plan remains appropriate    Co-evaluation                 End of Session Equipment Utilized During Treatment: Rolling walker;Gait belt   Activity Tolerance Patient tolerated treatment well   Patient Left in chair;with call bell/phone within reach;with chair alarm set;with family/visitor present   Nurse Communication Mobility status        Time: [id number] OT Time Calculation (min): 43 min  Charges: OT General Charges $OT Visit: 1 Procedure OT Treatments $Self Care/Home Management : 8-22 mins $Therapeutic Activity: 8-22 mins $Neuromuscular Re-education: 8-22 mins  Adrian Neal,Adrian Neal 08/01/2014, 10:48 AM   Maurie Boettcher, OTR/L  [phone] 08/01/2014

## 2014-08-01 NOTE — Progress Notes (Signed)
PROGRESS NOTE  Adrian Neal [id number] DOB: 1966-03-19 DOA: 07/27/2014 PCP: No primary care provider on file.  HPI/Recap of past 24 hours:  Continue to feel better, tolerated regular diet, still some dizziness, more upon standing.   Assessment/Plan: Principal Problem:   Vertigo Active Problems:   Type 1 diabetes mellitus   Hyperlipidemia   Intractable nausea and vomiting   Essential hypertension   Cerebral thrombosis with cerebral infarction   ARF (acute renal failure)   Insulin dependent diabetes mellitus  Acute CVA: mri showed 48mm acute LEFT mesial brachium pontis infarct On tele, carotid US unremarkable, echocardiogram lVH/LVEF wnl/grade II diastolic dysfunction. allowed permissive htn for 2 days initially on admission, neurology consulted. ldl 70, TSH wnl, a1c 8.1, started high dose asa from this admission. bp meds adjustment to achieve optimal bp control. started norvasc/coreg, continue hold acei in the setting of arf.  Steady Improvement, CIR admission?  N/v/dizziness:  from CVA? Symptomatic control, prn meclizine.  Much improved but still some nausea, component of diabetic gastroparesis? Tolerating low dose reglan, increase to 10mg  qh6r on 4/3, advance diet as tolerated. 4/4 tolerating regular diet  DM1, a1c 8.1, patient want to use insulin pump here, diabetes nurse to coordinate insulin pump use here.  Htn:  allowed permissive htn first two days in the hospital. Currently on norvasc/coreg.  Add imdur/lisinopril on 4/4  ARF, unknown baseline. ua unremarkable, Likely from dehydration, could have underline ckd from longstanding diabetes. Renal US negative. ct ab no acute finding either. S/p Hydration, renal dosing meds.  Cr slightly better, d/c ivf, continue monitor.  dvt prophlaxis: lovenox 30mg  sub Q qd  Code Status: full  Family Communication: patient and wife  Disposition Plan: remain in  patient   Consultants:  Neurology/CIR  Procedures:  Mri/echo  Antibiotics:  none   Objective: BP 176/81 mmHg  Pulse 85  Temp(Src) 98 F (36.7 C) (Oral)  Resp 15  Ht 5\' 8"  (1.727 m)  Wt 76.8 kg (169 lb 5 oz)  BMI 25.75 kg/m2  SpO2 99%  Intake/Output Summary (Last 24 hours) at 08/01/14 1951 Last data filed at 08/01/14 1045  Gross per 24 hour  Intake    840 ml  Output    900 ml  Net    -60 ml   Filed Weights   07/28/14 0018  Weight: 76.8 kg (169 lb 5 oz)    Exam:   General:  No distress, smiling  Cardiovascular: NSR  Respiratory: CTABL  Abdomen: Soft/ND/NT, positive BS  Musculoskeletal: No Edema  Neuro: no obvious focal deficit.  Data Reviewed: Basic Metabolic Panel:  Recent Labs Lab 07/27/14 1638 07/28/14 0656 07/29/14 0536 07/30/14 0620 08/01/14 0602  NA 142 142 135 139 140  K 3.9 3.6 4.0 4.1 3.5  CL 108 109 105 109 106  CO2 24 19 17* 26 26  GLUCOSE 187* 319* 372* 141* 167*  BUN 24* 27* 54* 42* 15  CREATININE 1.58* 2.10* 2.61* 2.17* 1.36*  CALCIUM 9.1 8.9 8.8 8.6 8.5  MG  --   --  2.0  --   --    Liver Function Tests:  Recent Labs Lab 07/28/14 1036 07/29/14 0536 07/30/14 0620 07/31/14 1100  AST 20 29 21  38*  ALT 24 22 21  40  ALKPHOS 88 94 78 74  BILITOT 1.7* 1.6* 1.5* 1.1  PROT 6.1 5.6* 4.9* 4.8*  ALBUMIN 3.4* 3.2* 2.8* 2.7*    Recent Labs Lab 07/27/14 1638 08/01/14 0602  LIPASE 22 26   No  results for input(s): AMMONIA in the last 168 hours. CBC:  Recent Labs Lab 07/27/14 1638 07/28/14 0656 07/29/14 0536 07/30/14 0620 08/01/14 0602  WBC 13.3* 14.2* 21.5* 15.0* 5.5  HGB 15.2 13.8 15.4 13.8 14.2  HCT 43.3 42.8 44.8 39.4 41.1  MCV 84.9 87.7 87.0 84.4 85.3  PLT 124* 134* 161 125* 111*   Cardiac Enzymes:   No results for input(s): CKTOTAL, CKMB, CKMBINDEX, TROPONINI in the last 168 hours. BNP (last 3 results) No results for input(s): BNP in the last 8760 hours.  ProBNP (last 3 results) No results for  input(s): PROBNP in the last 8760 hours.  CBG:  Recent Labs Lab 07/31/14 2359 08/01/14 0413 08/01/14 0738 08/01/14 1156 08/01/14 1812  GLUCAP 86 134* 166* 191* 166*    No results found for this or any previous visit (from the past 240 hour(s)).   Studies: No results found.  Scheduled Meds: . amLODipine  10 mg Oral Daily  . aspirin EC  325 mg Oral Daily  . atorvastatin  20 mg Oral q1800  . carvedilol  25 mg Oral BID WC  . enoxaparin (LOVENOX) injection  40 mg Subcutaneous Daily  . insulin pump   Subcutaneous 6 times per day  . [START ON 08/02/2014] isosorbide mononitrate  60 mg Oral Daily  . lisinopril  2.5 mg Oral Daily  . metoCLOPramide (REGLAN) injection  10 mg Intravenous 4 times per day  . omega-3 acid ethyl esters  1 g Oral BID  . sodium chloride  3 mL Intravenous Q12H    Continuous Infusions:  ivf stopped on 4/2   Time spent: 30mins  Mateya Torti MD, PhD  [facility] Pager [phone]. If 7PM-7AM, please contact night-coverage at [email or web address], password [email or web address] 08/01/2014, 7:51 PM  LOS: 4 days

## 2014-08-01 NOTE — Progress Notes (Signed)
Physical Therapy Treatment Patient Details Name: Adrian Neal MRN: [id number] DOB: 20-Nov-1965 Today's Date: 08/01/2014    History of Present Illness Pt. was admitted 07/27/14 with dizziness and nausea, falling toward left.   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  Pt. with PMH of poorly controlled diabetes (he reports being diabetic since age 49) and HTN.  Pt. wears an insulin pump normally.    PT Comments    Pt now able to maneuver the RW, use compensatory scanning technique and maintain balance with minimal assist.  If asked to reduce assist of RW or change speed, gait pattern degrades from its already rigid and uncoordinated state. Would be a great CIR candidate.  Follow Up Recommendations  CIR     Equipment Recommendations  Other (comment)    Recommendations for Other Services Rehab consult     Precautions / Restrictions Precautions Precautions: Fall    Mobility  Bed Mobility Overal bed mobility: Needs Assistance Bed Mobility: Supine to Sit     Supine to sit: Supervision     General bed mobility comments: using gaze stabilization taught earlier  Transfers Overall transfer level: Needs assistance Equipment used: Rolling walker (2 wheeled) Transfers: Sit to/from Stand Sit to Stand: Min assist         General transfer comment: slow, but generally symetrical  Ambulation/Gait Ambulation/Gait assistance: Min assist;Min guard Ambulation Distance (Feet): 120 Feet Assistive device: Rolling walker (2 wheeled) Gait Pattern/deviations: Step-to pattern Gait velocity: slow and guarded Gait velocity interpretation: Below normal speed for age/gender General Gait Details: gait characterized as slow with decreased coordination of L LE as far as advancing and letting it lag at times during toe off.   Stairs            Wheelchair Mobility    Modified Rankin (Stroke Patients Only) Modified Rankin (Stroke Patients Only) Pre-Morbid Rankin Score: No  symptoms Modified Rankin: Moderately severe disability     Balance Overall balance assessment: Needs assistance Sitting-balance support: No upper extremity supported Sitting balance-Leahy Scale: Fair Sitting balance - Comments: sat well in midline, could scan me in the room, but not accept challenge   Standing balance support: No upper extremity supported;Bilateral upper extremity supported Standing balance-Leahy Scale: Fair Standing balance comment: able to stand statically without holding to the RW, but unable to scan or accept any challenge                    Cognition Arousal/Alertness: Awake/alert Behavior During Therapy: WFL for tasks assessed/performed Overall Cognitive Status: Within Functional Limits for tasks assessed                      Exercises      General Comments        Pertinent Vitals/Pain Pain Assessment: No/denies pain    Home Living                      Prior Function            PT Goals (current goals can now be found in the care plan section) Acute Rehab PT Goals Patient Stated Goal: to get better  PT Goal Formulation: With patient Time For Goal Achievement: 08/05/14 Potential to Achieve Goals: Good Progress towards PT goals: Progressing toward goals    Frequency  Min 4X/week    PT Plan Current plan remains appropriate    Co-evaluation  End of Session Equipment Utilized During Treatment: Gait belt Activity Tolerance: Patient tolerated treatment well Patient left: in chair;with call bell/phone within reach     Time: 1448-1519 PT Time Calculation (min) (ACUTE ONLY): 31 min  Charges:  $Gait Training: 8-22 mins $Therapeutic Activity: 8-22 mins                    G Codes:      Molly Maselli, Tessie Fass 08/01/2014, 3:32 PM 08/01/2014  Donnella Sham, PT [phone]  (pager)

## 2014-08-02 LAB — COMPREHENSIVE METABOLIC PANEL
ALK PHOS: 75 U/L (ref 39–117)
ALT: 85 U/L — AB (ref 0–53)
ANION GAP: 3 — AB (ref 5–15)
AST: 47 U/L — ABNORMAL HIGH (ref 0–37)
Albumin: 2.6 g/dL — ABNORMAL LOW (ref 3.5–5.2)
BILIRUBIN TOTAL: 1.1 mg/dL (ref 0.3–1.2)
BUN: 16 mg/dL (ref 6–23)
CO2: 30 mmol/L (ref 19–32)
Calcium: 8.2 mg/dL — ABNORMAL LOW (ref 8.4–10.5)
Chloride: 105 mmol/L (ref 96–112)
Creatinine, Ser: 1.49 mg/dL — ABNORMAL HIGH (ref 0.50–1.35)
GFR, EST AFRICAN AMERICAN: 62 mL/min — AB (ref >90)
GFR, EST NON AFRICAN AMERICAN: 54 mL/min — AB (ref >90)
GLUCOSE: 184 mg/dL — AB (ref 70–99)
POTASSIUM: 3.2 mmol/L — AB (ref 3.5–5.1)
SODIUM: 138 mmol/L (ref 135–145)
Total Protein: 4.8 g/dL — ABNORMAL LOW (ref 6.0–8.3)

## 2014-08-02 LAB — CBC
HCT: 39.6 % (ref 39.0–52.0)
HEMOGLOBIN: 13.5 g/dL (ref 13.0–17.0)
MCH: 29 pg (ref 26.0–34.0)
MCHC: 34.1 g/dL (ref 30.0–36.0)
MCV: 85 fL (ref 78.0–100.0)
Platelets: 105 10*3/uL — ABNORMAL LOW (ref 150–400)
RBC: 4.66 MIL/uL (ref 4.22–5.81)
RDW: 12.5 % (ref 11.5–15.5)
WBC: 6 10*3/uL (ref 4.0–10.5)

## 2014-08-02 LAB — GLUCOSE, CAPILLARY
GLUCOSE-CAPILLARY: 289 mg/dL — AB (ref 70–99)
GLUCOSE-CAPILLARY: 164 mg/dL — AB (ref 70–99)
GLUCOSE-CAPILLARY: 162 mg/dL — AB (ref 70–99)
Glucose-Capillary: 183 mg/dL — ABNORMAL HIGH (ref 70–99)
Glucose-Capillary: 94 mg/dL (ref 70–99)

## 2014-08-02 LAB — MAGNESIUM: Magnesium: 1.7 mg/dL (ref 1.5–2.5)

## 2014-08-02 NOTE — Progress Notes (Signed)
Inpatient Diabetes Program Recommendations  AACE/ADA: New Consensus Statement on Inpatient Glycemic Control (2013)  Target Ranges:  Prepandial:   less than 140 mg/dL      Peak postprandial:   less than 180 mg/dL (1-2 hours)      Critically ill patients:  140 - 180 mg/dL   Results for Adrian Neal, Adrian Neal (MRN [id number]) as of 08/02/2014 14:40  Ref. Range 08/01/2014 20:28 08/02/2014 00:12 08/02/2014 04:01 08/02/2014 07:55 08/02/2014 11:59  Glucose-Capillary Latest Range: 70-99 mg/dL 167 (H) 94 289 (H) 164 (H) 183 (H)     Reason for assessment; elevated CBG last night   Patient is back on his insulin pump.  Spoke to him by phone today- he changed his site 2 days ago-  states site is healthy. Patient tells me that his CBG was 94 mg/dl last night and out of fear of going low and instead of suspending his pump, he disconnected so he could sleep.  Corrected CBG this am- pleased with current blood sugar numbers.  Patient verbalized that he will follow up with MD on discharge and knows how to contact the [employer] rep for questions regarding his pump.   Gentry Fitz, RN, BA, MHA, CDE Diabetes Coordinator Inpatient Diabetes Program  [phone] (Team Pager) [phone] [facility]) 08/02/2014 2:44 PM

## 2014-08-02 NOTE — Progress Notes (Signed)
I await insurance decision and bed availability for admission to inpt rehab. I spoke with Dr. Erlinda Hong, pt and wife at bedside. They are aware that bed may not be available. I will follow up in the am. [phone]

## 2014-08-02 NOTE — Progress Notes (Signed)
Physical Therapy Treatment Patient Details Name: Adrian Neal MRN: [id number] DOB: 01/21/1966 Today's Date: 08/02/2014    History of Present Illness Pt. was admitted 07/27/14 with dizziness and nausea, falling toward left.   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  Pt. with PMH of poorly controlled diabetes (he reports being diabetic since age 49) and HTN.  Pt. wears an insulin pump normally.    PT Comments    Progressing steadily with gait, but continues to rely heavily on UE's and upper body for truncal stability.  Follow Up Recommendations  CIR     Equipment Recommendations  Other (comment)    Recommendations for Other Services Rehab consult     Precautions / Restrictions Precautions Precautions: Fall    Mobility  Bed Mobility                  Transfers Overall transfer level: Needs assistance Equipment used: Rolling walker (2 wheeled);None Transfers: Sit to/from Stand Sit to Stand: Min assist         General transfer comment: slow and generally symetrical  Ambulation/Gait Ambulation/Gait assistance: Min assist;Min guard Ambulation Distance (Feet): 140 Feet (total, 50 feet with rail and/or min HHA) Assistive device: None;Rolling walker (2 wheeled) Gait Pattern/deviations: Step-through pattern;Step-to pattern;Decreased step length - right;Decreased stance time - left;Decreased stride length Gait velocity: slow and guarded   General Gait Details: pt progressed from a stiff straight=-legged gait to a more heel/toe pattern.  Changed from RW to just rail to work on trunk control and pelvic translation forward.  Pt has problems when he loses contact with the rail and patterns degrade to get to the next hand hold.   Stairs            Wheelchair Mobility    Modified Rankin (Stroke Patients Only) Modified Rankin (Stroke Patients Only) Pre-Morbid Rankin Score: No symptoms Modified Rankin: Moderately severe disability     Balance Overall  balance assessment: Needs assistance   Sitting balance-Leahy Scale: Good       Standing balance-Leahy Scale: Fair Standing balance comment: static only and then feels much more confident holding to something.                    Cognition Arousal/Alertness: Awake/alert Behavior During Therapy: WFL for tasks assessed/performed Overall Cognitive Status: Within Functional Limits for tasks assessed                      Exercises      General Comments        Pertinent Vitals/Pain Pain Assessment: No/denies pain    Home Living                      Prior Function            PT Goals (current goals can now be found in the care plan section) Acute Rehab PT Goals Patient Stated Goal: to get better  PT Goal Formulation: With patient Time For Goal Achievement: 08/05/14 Potential to Achieve Goals: Good Progress towards PT goals: Progressing toward goals    Frequency  Min 4X/week    PT Plan Current plan remains appropriate    Co-evaluation             End of Session   Activity Tolerance: Patient tolerated treatment well Patient left: in chair;with call bell/phone within reach     Time: 1635-1703 PT Time Calculation (min) (ACUTE ONLY): 28 min  Charges:  $Gait Training: 8-22 mins $Therapeutic Activity: 8-22 mins                    G Codes:      Zamani Crocker, Tessie Fass 08/02/2014, 5:15 PM 08/02/2014  Donnella Sham, PT [phone]  (pager)

## 2014-08-02 NOTE — Progress Notes (Signed)
PROGRESS NOTE  Adrian Neal [id number] DOB: 05-01-1965 DOA: 07/27/2014 PCP: No primary care provider on file.  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 allowed permissive htn for 2 days initially on admission, neurology consulted. ldl 70, TSH wnl, a1c 8.1, started high dose asa from this admission. bp meds adjustment to achieve optimal bp control. started norvasc/coreg,  acei held due to elevation of cr initially, restarted at low dose from 4/4. Steady Improvement, CIR admission?  N/v/dizziness:  from CVA? Symptomatic control, prn meclizine.  Much improved, reported responded well to reglan, n/v has resolved. component of diabetic gastroparesis?  tolerating regular diet  DM1, a1c 8.1, patient wants to use insulin pump here, diabetes nurse to coordinate insulin pump use here.  Htn:  allowed permissive htn first two days in the hospital. Currently on norvasc/coreg/low dose lisinopril.    ARF, unknown baseline. ua unremarkable, Likely from dehydration, could have underline ckd from longstanding diabetes. Renal US negative. ct ab no acute finding either. S/p Hydration, renal dosing meds.  Cr improving.  Leukocytosis/sinus tachycardia on admission: from stress/dehydration. Resolved.  dvt prophlaxis: lovenox 30mg  sub Q qd  Code Status: full  Family Communication: patient and wife  Disposition Plan: ready for discharge when rehab bed available, CIR here [facility].   Consultants:  Neurology/CIR  Procedures:  Mri/echo  Antibiotics:  none   Objective: BP 137/62 mmHg  Pulse 84  Temp(Src) 98.1 F (36.7 C) (Oral)  Resp 18  Ht 5\' 8"  (1.727 m)  Wt 76.8 kg (169 lb 5 oz)  BMI 25.75 kg/m2  SpO2 97%  Intake/Output Summary (Last 24 hours) at 08/02/14 2306 Last data filed at 08/02/14 1617  Gross per 24 hour  Intake    480 ml  Output   2700 ml  Net  -2220 ml   Filed Weights   07/28/14 0018  Weight: 76.8 kg (169 lb 5 oz)    Exam:   General:  No distress, smiling  Cardiovascular: NSR  Respiratory: CTABL  Abdomen: Soft/ND/NT, positive BS  Musculoskeletal: No Edema  Neuro: no obvious focal deficit.  Data Reviewed: Basic Metabolic Panel:  Recent Labs Lab 07/28/14 0656 07/29/14 0536 07/30/14 0620 08/01/14 0602 08/02/14 0720  NA 142 135 139 140 138  K 3.6 4.0 4.1 3.5 3.2*  CL 109 105 109 106 105  CO2 19 17* 26 26 30   GLUCOSE 319* 372* 141* 167* 184*  BUN 27* 54* 42* 15 16  CREATININE 2.10* 2.61* 2.17* 1.36* 1.49*  CALCIUM 8.9 8.8 8.6 8.5 8.2*  MG  --  2.0  --   --  1.7   Liver Function Tests:  Recent Labs Lab 07/28/14 1036 07/29/14 0536 07/30/14 0620 07/31/14 1100 08/02/14 0720  AST 20 29 21  38* 47*  ALT 24 22 21  40 85*  ALKPHOS 88 94 78 74 75  BILITOT 1.7* 1.6* 1.5* 1.1 1.1  PROT 6.1 5.6* 4.9* 4.8* 4.8*  ALBUMIN 3.4* 3.2* 2.8* 2.7* 2.6*    Recent Labs Lab 07/27/14 1638 08/01/14 0602  LIPASE 22 26   No results for input(s): AMMONIA in the last 168 hours. CBC:  Recent Labs Lab 07/28/14 0656 07/29/14 0536 07/30/14 0620 08/01/14 0602 08/02/14 0720  WBC 14.2* 21.5* 15.0* 5.5 6.0  HGB 13.8 15.4 13.8 14.2 13.5  HCT 42.8 44.8 39.4 41.1 39.6  MCV 87.7 87.0 84.4 85.3 85.0  PLT 134* 161 125* 111* 105*   Cardiac Enzymes:   No results for input(s): CKTOTAL, CKMB, CKMBINDEX, TROPONINI in the last 168 hours. BNP (last 3 results) No results for input(s): BNP in the last 8760 hours.  ProBNP  (last 3 results) No results for input(s): PROBNP in the last 8760 hours.  CBG:  Recent Labs Lab 08/02/14 0012 08/02/14 0401 08/02/14 0755 08/02/14 1159 08/02/14 1625  GLUCAP 94 289* 164* 183* 162*    No results found for this or any previous visit (from the past 240 hour(s)).   Studies: No results found.  Scheduled Meds: . amLODipine  10 mg Oral Daily  . aspirin EC  325 mg Oral Daily  . atorvastatin  20 mg Oral q1800  . carvedilol  25 mg Oral BID WC  . enoxaparin (LOVENOX) injection  40 mg Subcutaneous Daily  . insulin pump   Subcutaneous 6 times per day  . lisinopril  2.5 mg Oral Daily  . metoCLOPramide (REGLAN) injection  10 mg Intravenous 4 times per day  . omega-3 acid ethyl esters  1 g Oral BID  . sodium chloride  3 mL Intravenous Q12H    Continuous Infusions:  ivf stopped on 4/2   Time spent: 22mins  Janaya Broy MD, PhD  [facility] Pager [phone]. If 7PM-7AM, please contact night-coverage at [email or web address], password [facility] 08/02/2014, 11:06 PM  LOS: 5 days

## 2014-08-03 ENCOUNTER — Encounter (HOSPITAL_COMMUNITY): Payer: Self-pay | Admitting: Physical Medicine and Rehabilitation

## 2014-08-03 ENCOUNTER — Inpatient Hospital Stay (HOSPITAL_COMMUNITY)
Admission: RE | Admit: 2014-08-03 | Discharge: 2014-08-12 | DRG: 057 | Disposition: A | Discharge status: Home / Self Care | Payer: 59 | Source: Intra-hospital | Attending: Physical Medicine & Rehabilitation | Admitting: Physical Medicine & Rehabilitation

## 2014-08-03 DIAGNOSIS — Z794 Long term (current) use of insulin: Secondary | ICD-10-CM | POA: Diagnosis not present

## 2014-08-03 DIAGNOSIS — E1021 Type 1 diabetes mellitus with diabetic nephropathy: Secondary | ICD-10-CM | POA: Diagnosis not present

## 2014-08-03 DIAGNOSIS — R270 Ataxia, unspecified: Secondary | ICD-10-CM | POA: Diagnosis present

## 2014-08-03 DIAGNOSIS — I69398 Other sequelae of cerebral infarction: Principal | ICD-10-CM

## 2014-08-03 DIAGNOSIS — I129 Hypertensive chronic kidney disease with stage 1 through stage 4 chronic kidney disease, or unspecified chronic kidney disease: Secondary | ICD-10-CM | POA: Diagnosis present

## 2014-08-03 DIAGNOSIS — N179 Acute kidney failure, unspecified: Secondary | ICD-10-CM | POA: Diagnosis present

## 2014-08-03 DIAGNOSIS — I6339 Cerebral infarction due to thrombosis of other cerebral artery: Secondary | ICD-10-CM | POA: Diagnosis not present

## 2014-08-03 DIAGNOSIS — E109 Type 1 diabetes mellitus without complications: Secondary | ICD-10-CM | POA: Diagnosis present

## 2014-08-03 DIAGNOSIS — I10 Essential (primary) hypertension: Secondary | ICD-10-CM | POA: Diagnosis present

## 2014-08-03 DIAGNOSIS — F064 Anxiety disorder due to known physiological condition: Secondary | ICD-10-CM | POA: Diagnosis present

## 2014-08-03 DIAGNOSIS — E10319 Type 1 diabetes mellitus with unspecified diabetic retinopathy without macular edema: Secondary | ICD-10-CM | POA: Diagnosis present

## 2014-08-03 DIAGNOSIS — I639 Cerebral infarction, unspecified: Secondary | ICD-10-CM | POA: Diagnosis present

## 2014-08-03 DIAGNOSIS — H5500 Unspecified nystagmus: Secondary | ICD-10-CM | POA: Diagnosis present

## 2014-08-03 DIAGNOSIS — I6359 Cerebral infarction due to unspecified occlusion or stenosis of other cerebral artery: Secondary | ICD-10-CM | POA: Diagnosis not present

## 2014-08-03 DIAGNOSIS — I6330 Cerebral infarction due to thrombosis of unspecified cerebral artery: Secondary | ICD-10-CM

## 2014-08-03 DIAGNOSIS — K210 Gastro-esophageal reflux disease with esophagitis: Secondary | ICD-10-CM | POA: Diagnosis present

## 2014-08-03 DIAGNOSIS — R7989 Other specified abnormal findings of blood chemistry: Secondary | ICD-10-CM | POA: Diagnosis not present

## 2014-08-03 DIAGNOSIS — E876 Hypokalemia: Secondary | ICD-10-CM | POA: Diagnosis present

## 2014-08-03 DIAGNOSIS — Z87891 Personal history of nicotine dependence: Secondary | ICD-10-CM | POA: Diagnosis not present

## 2014-08-03 DIAGNOSIS — N189 Chronic kidney disease, unspecified: Secondary | ICD-10-CM | POA: Diagnosis present

## 2014-08-03 DIAGNOSIS — K209 Esophagitis, unspecified: Secondary | ICD-10-CM | POA: Diagnosis present

## 2014-08-03 DIAGNOSIS — R945 Abnormal results of liver function studies: Secondary | ICD-10-CM | POA: Diagnosis not present

## 2014-08-03 DIAGNOSIS — I63012 Cerebral infarction due to thrombosis of left vertebral artery: Secondary | ICD-10-CM | POA: Diagnosis not present

## 2014-08-03 DIAGNOSIS — Z9641 Presence of insulin pump (external) (internal): Secondary | ICD-10-CM | POA: Diagnosis present

## 2014-08-03 DIAGNOSIS — E108 Type 1 diabetes mellitus with unspecified complications: Secondary | ICD-10-CM | POA: Diagnosis not present

## 2014-08-03 DIAGNOSIS — H02401 Unspecified ptosis of right eyelid: Secondary | ICD-10-CM | POA: Diagnosis present

## 2014-08-03 DIAGNOSIS — G4700 Insomnia, unspecified: Secondary | ICD-10-CM | POA: Diagnosis present

## 2014-08-03 DIAGNOSIS — I69393 Ataxia following cerebral infarction: Secondary | ICD-10-CM | POA: Diagnosis not present

## 2014-08-03 LAB — COMPREHENSIVE METABOLIC PANEL
ALT: 105 U/L — ABNORMAL HIGH (ref 0–53)
ANION GAP: 11 (ref 5–15)
AST: 64 U/L — AB (ref 0–37)
Albumin: 2.8 g/dL — ABNORMAL LOW (ref 3.5–5.2)
Alkaline Phosphatase: 91 U/L (ref 39–117)
BILIRUBIN TOTAL: 0.8 mg/dL (ref 0.3–1.2)
BUN: 16 mg/dL (ref 6–23)
CHLORIDE: 105 mmol/L (ref 96–112)
CO2: 23 mmol/L (ref 19–32)
CREATININE: 1.5 mg/dL — AB (ref 0.50–1.35)
Calcium: 8.5 mg/dL (ref 8.4–10.5)
GFR calc Af Amer: 62 mL/min — ABNORMAL LOW (ref >90)
GFR, EST NON AFRICAN AMERICAN: 53 mL/min — AB (ref >90)
Glucose, Bld: 177 mg/dL — ABNORMAL HIGH (ref 70–99)
POTASSIUM: 3.3 mmol/L — AB (ref 3.5–5.1)
Sodium: 139 mmol/L (ref 135–145)
Total Protein: 5 g/dL — ABNORMAL LOW (ref 6.0–8.3)

## 2014-08-03 LAB — GLUCOSE, CAPILLARY
GLUCOSE-CAPILLARY: 117 mg/dL — AB (ref 70–99)
GLUCOSE-CAPILLARY: 194 mg/dL — AB (ref 70–99)
GLUCOSE-CAPILLARY: 92 mg/dL (ref 70–99)
GLUCOSE-CAPILLARY: 96 mg/dL (ref 70–99)
Glucose-Capillary: 202 mg/dL — ABNORMAL HIGH (ref 70–99)
Glucose-Capillary: 219 mg/dL — ABNORMAL HIGH (ref 70–99)
Glucose-Capillary: 58 mg/dL — ABNORMAL LOW (ref 70–99)

## 2014-08-03 MED ORDER — ENOXAPARIN SODIUM 40 MG/0.4ML ~~LOC~~ SOLN
40.0000 mg | Freq: Every day | SUBCUTANEOUS | Status: DC
  Administered 2014-08-04 – 2014-08-12 (×9): 40 mg via SUBCUTANEOUS
  Filled 2014-08-03 (×11): qty 0.4

## 2014-08-03 MED ORDER — DIPHENHYDRAMINE HCL 12.5 MG/5ML PO ELIX
12.5000 mg | ORAL_SOLUTION | Freq: Four times a day (QID) | ORAL | Status: DC | PRN
Start: 2014-08-03 — End: 2014-08-12

## 2014-08-03 MED ORDER — ONDANSETRON HCL 4 MG/2ML IJ SOLN: 4.0000 mg | Freq: Four times a day (QID) | INTRAMUSCULAR | Status: DC | PRN

## 2014-08-03 MED ORDER — OMEGA-3-ACID ETHYL ESTERS 1 G PO CAPS
1.0000 g | ORAL_CAPSULE | Freq: Two times a day (BID) | ORAL | Status: DC
  Administered 2014-08-05 – 2014-08-07 (×3): 1 g via ORAL
  Filled 2014-08-03 (×20): qty 1

## 2014-08-03 MED ORDER — ONDANSETRON HCL 4 MG PO TABS
4.0000 mg | ORAL_TABLET | Freq: Four times a day (QID) | ORAL | Status: DC | PRN
  Administered 2014-08-11: 4 mg via ORAL
  Filled 2014-08-03: qty 1

## 2014-08-03 MED ORDER — AMLODIPINE BESYLATE 10 MG PO TABS: 10.0000 mg | ORAL_TABLET | Freq: Every day | ORAL | Status: DC

## 2014-08-03 MED ORDER — INSULIN PUMP
SUBCUTANEOUS | Status: DC
  Administered 2014-08-04: 6.9 via SUBCUTANEOUS
  Administered 2014-08-04: 9.5 via SUBCUTANEOUS
  Administered 2014-08-04: 12.6 via SUBCUTANEOUS
  Administered 2014-08-04 (×3): via SUBCUTANEOUS
  Administered 2014-08-05: 18 via SUBCUTANEOUS
  Administered 2014-08-05: 9.6 via SUBCUTANEOUS
  Administered 2014-08-05: 1 via SUBCUTANEOUS
  Administered 2014-08-05: 17.6 via SUBCUTANEOUS
  Administered 2014-08-06: 20:00:00 via SUBCUTANEOUS
  Administered 2014-08-06: 6.3 via SUBCUTANEOUS
  Administered 2014-08-06 – 2014-08-07 (×4): via SUBCUTANEOUS
  Administered 2014-08-07: 7.8 via SUBCUTANEOUS
  Administered 2014-08-08: 10 via SUBCUTANEOUS
  Administered 2014-08-08: 9 via SUBCUTANEOUS
  Administered 2014-08-08: 7.8 via SUBCUTANEOUS
  Administered 2014-08-08: 10 via SUBCUTANEOUS
  Administered 2014-08-08: 11.6 via SUBCUTANEOUS
  Administered 2014-08-08: 7.8 via SUBCUTANEOUS
  Administered 2014-08-09: 6.9 via SUBCUTANEOUS
  Administered 2014-08-09: 8.4 via SUBCUTANEOUS
  Administered 2014-08-09: 12.4 via SUBCUTANEOUS
  Administered 2014-08-09: 12.8 via SUBCUTANEOUS
  Administered 2014-08-10: 10 via SUBCUTANEOUS
  Administered 2014-08-10: 12 via SUBCUTANEOUS
  Administered 2014-08-10: 11.4 via SUBCUTANEOUS
  Administered 2014-08-11: 20:00:00 via SUBCUTANEOUS
  Administered 2014-08-11: 6 via SUBCUTANEOUS
  Administered 2014-08-11: 20 via SUBCUTANEOUS
  Administered 2014-08-11: 13 via SUBCUTANEOUS
  Administered 2014-08-12 (×2): via SUBCUTANEOUS
  Filled 2014-08-03: qty 1

## 2014-08-03 MED ORDER — INSULIN PUMP: 0.0000 | SUBCUTANEOUS | Status: DC

## 2014-08-03 MED ORDER — FLEET ENEMA 7-19 GM/118ML RE ENEM: 1.0000 | ENEMA | Freq: Once | RECTAL | Status: AC | PRN

## 2014-08-03 MED ORDER — CARVEDILOL 25 MG PO TABS: 25.0000 mg | ORAL_TABLET | Freq: Two times a day (BID) | ORAL | Status: DC

## 2014-08-03 MED ORDER — METOCLOPRAMIDE HCL 5 MG PO TABS
5.0000 mg | ORAL_TABLET | Freq: Two times a day (BID) | ORAL | Status: AC
  Administered 2014-08-04 – 2014-08-05 (×2): 5 mg via ORAL
  Filled 2014-08-03 (×2): qty 1

## 2014-08-03 MED ORDER — PANTOPRAZOLE SODIUM 40 MG PO TBEC
40.0000 mg | DELAYED_RELEASE_TABLET | Freq: Every day | ORAL | Status: DC
  Administered 2014-08-04 – 2014-08-12 (×9): 40 mg via ORAL
  Filled 2014-08-03 (×10): qty 1

## 2014-08-03 MED ORDER — GUAIFENESIN-DM 100-10 MG/5ML PO SYRP: 5.0000 mL | ORAL_SOLUTION | Freq: Four times a day (QID) | ORAL | Status: DC | PRN

## 2014-08-03 MED ORDER — ALUM & MAG HYDROXIDE-SIMETH 200-200-20 MG/5ML PO SUSP
30.0000 mL | ORAL | Status: DC | PRN
  Administered 2014-08-11: 30 mL via ORAL
  Filled 2014-08-03: qty 30

## 2014-08-03 MED ORDER — CHLORPROMAZINE HCL 10 MG PO TABS
10.0000 mg | ORAL_TABLET | Freq: Three times a day (TID) | ORAL | Status: DC | PRN
  Filled 2014-08-03: qty 1

## 2014-08-03 MED ORDER — AMLODIPINE BESYLATE 10 MG PO TABS
10.0000 mg | ORAL_TABLET | Freq: Every day | ORAL | Status: DC
  Administered 2014-08-04 – 2014-08-12 (×9): 10 mg via ORAL
  Filled 2014-08-03 (×10): qty 1

## 2014-08-03 MED ORDER — CARVEDILOL 25 MG PO TABS
25.0000 mg | ORAL_TABLET | Freq: Two times a day (BID) | ORAL | Status: DC
  Administered 2014-08-04 – 2014-08-12 (×17): 25 mg via ORAL
  Filled 2014-08-03 (×20): qty 1

## 2014-08-03 MED ORDER — BISACODYL 10 MG RE SUPP: 10.0000 mg | Freq: Every day | RECTAL | Status: DC | PRN

## 2014-08-03 MED ORDER — ASPIRIN 325 MG PO TBEC: 325.0000 mg | DELAYED_RELEASE_TABLET | Freq: Every day | ORAL | Status: DC

## 2014-08-03 MED ORDER — SUCRALFATE 1 GM/10ML PO SUSP
1.0000 g | Freq: Three times a day (TID) | ORAL | Status: AC
  Administered 2014-08-04 – 2014-08-08 (×11): 1 g via ORAL
  Filled 2014-08-03 (×20): qty 10

## 2014-08-03 MED ORDER — ACETAMINOPHEN 325 MG PO TABS: 325.0000 mg | ORAL_TABLET | ORAL | Status: DC | PRN

## 2014-08-03 MED ORDER — LISINOPRIL 2.5 MG PO TABS
2.5000 mg | ORAL_TABLET | Freq: Every day | ORAL | Status: DC
  Administered 2014-08-04 – 2014-08-12 (×9): 2.5 mg via ORAL
  Filled 2014-08-03 (×10): qty 1

## 2014-08-03 MED ORDER — ATORVASTATIN CALCIUM 20 MG PO TABS
20.0000 mg | ORAL_TABLET | Freq: Every day | ORAL | Status: DC
  Administered 2014-08-04 – 2014-08-09 (×6): 20 mg via ORAL
  Filled 2014-08-03 (×7): qty 1

## 2014-08-03 MED ORDER — METOCLOPRAMIDE HCL 5 MG PO TABS
5.0000 mg | ORAL_TABLET | Freq: Three times a day (TID) | ORAL | Status: AC
  Administered 2014-08-04 (×2): 5 mg via ORAL
  Filled 2014-08-03 (×2): qty 1

## 2014-08-03 MED ORDER — ASPIRIN EC 325 MG PO TBEC
325.0000 mg | DELAYED_RELEASE_TABLET | Freq: Every day | ORAL | Status: DC
Start: 2014-08-04 — End: 2014-08-12
  Administered 2014-08-04 – 2014-08-12 (×9): 325 mg via ORAL
  Filled 2014-08-03 (×10): qty 1

## 2014-08-03 MED ORDER — MECLIZINE HCL 25 MG PO TABS: 25.0000 mg | ORAL_TABLET | Freq: Three times a day (TID) | ORAL | Status: DC | PRN

## 2014-08-03 MED ORDER — METOCLOPRAMIDE HCL 5 MG/ML IJ SOLN
10.0000 mg | Freq: Four times a day (QID) | INTRAMUSCULAR | Status: DC
  Filled 2014-08-03 (×2): qty 2

## 2014-08-03 NOTE — Progress Notes (Signed)
Physical Medicine and Rehabilitation Consult   Reason for Consult: Dizziness with difficulty walking Referring Physician: Dr. Dillard Cannon   HPI: Adrian Neal is a 50 y.o. male with history of DM and HTN who was admitted on 03/30 with complaints of dizziness, nausea and difficulty walking. MRI of brain done revealing acute left pontine infarct. Carotid dopplers without significant ICA stenosis. Neurology consulted for input and recommended ASA for secondary stroke prevention. 2D echo done today. PT evaluation done revealing staggering gait with LLE ataxia. CIR recommended by MD and rehab team.    Review of Systems  Constitutional: Negative for fever and chills.  Eyes: Positive for blurred vision, double vision and photophobia.  Cardiovascular: Negative for chest pain and palpitations.  Gastrointestinal: Positive for nausea and vomiting.  Neurological: Positive for dizziness, tingling, focal weakness and headaches.      Past Medical History  Diagnosis Date  . Hypertension   . Diabetes mellitus without complication      Past Surgical History  Procedure Laterality Date  . Eye surgery       Family History  Problem Relation Age of Onset  . Stroke Mother   . Hypertension Mother   . Hyperlipidemia Mother   . Hyperlipidemia Father   . Hypertension Father     Social History:  reports that he quit smoking about 23 years ago. He does not have any smokeless tobacco history on file. He reports that he does not drink alcohol or use illicit drugs.    Allergies: No Known Allergies    Medications Prior to Admission  Medication Sig Dispense Refill  . HUMALOG 100 UNIT/ML injection Inject 60 Units into the skin daily.     . metoprolol succinate (TOPROL-XL) 25 MG 24 hr tablet Take 25 mg by mouth daily.     . naproxen sodium (ANAPROX) 220 MG tablet Take 220 mg by mouth 2 (two) times daily with a meal. For knee pain      . omega-3 acid ethyl esters (LOVAZA) 1 G capsule Take 1 g by mouth 2 (two) times daily.    . ramipril (ALTACE) 10 MG capsule Take 10 mg by mouth daily.    . simvastatin (ZOCOR) 40 MG tablet Take 40 mg by mouth daily.      Home: Home Living Family/patient expects to be discharged to:: Private residence Living Arrangements: Children, Spouse/significant other (kids 31. 83 and 15) Available Help at Discharge: Family, Available 24 hours/day Type of Home: House Home Access: Level entry Home Layout: Two level Alternate Level Stairs-Number of Steps: flight Alternate Level Stairs-Rails: Right, Left, Can reach both Home Equipment: None  Functional History: Prior Function Level of Independence: Independent Comments: drives, works as Freight forwarder at [employer] in [place]:  Mobility: Bed Mobility Overal bed mobility: Needs Assistance Bed Mobility: Supine to Sit Supine to sit: Min guard General bed mobility comments: Pt. with visual disturbance, feeling of room spinning necessitating close supervision to min guard assist. Pt. using bed rail and foot board for added stability as he transitioned to sitting position at EOB Transfers Overall transfer level: Needs assistance Equipment used: Rolling walker (2 wheeled) Transfers: Sit to/from Stand Sit to Stand: Min assist General transfer comment: Pt. had adequate strenght to rise to stand on his own power, however due to visual disturbance/room spinning, pt. needed min assist to provide stability in translation to standing Ambulation/Gait Ambulation/Gait assistance: Min assist Ambulation Distance (Feet): 20 Feet Assistive device: Rolling walker (2 wheeled) Gait Pattern/deviations: Step-through pattern, Decreased  weight shift to right, Staggering left, Drifts right/left, Wide base of support Gait velocity: decreased General Gait Details: Pt. with notable decreased coordination in L LE with ambulation as well  as decreased control of knee extension with hyperextension at times. Pt. with left lean tendancy during short distance gait. Needed external support for feeling of stability.    ADL:    Cognition: Cognition Overall Cognitive Status: Within Functional Limits for tasks assessed Orientation Level: Oriented X4 Memory: Appears intact Awareness: Appears intact Problem Solving: Appears intact Safety/Judgment: Appears intact Cognition Arousal/Alertness: Awake/alert Behavior During Therapy: WFL for tasks assessed/performed Overall Cognitive Status: Within Functional Limits for tasks assessed  Blood pressure 184/67, pulse 104, temperature 97.5 F (36.4 C), temperature source Oral, resp. rate 18, height 5\' 8"  (1.727 m), weight 76.8 kg (169 lb 5 oz), SpO2 100 %. Physical Exam  Constitutional: He appears distressed.  HENT:  Head: Normocephalic and atraumatic.  Eyes: Conjunctivae and EOM are normal. Pupils are equal, round, and reactive to light.  Neck: No tracheal deviation present. No thyromegaly present.  Cardiovascular:  Tachycardic Respiratory: No respiratory distress. He has no wheezes.  Neurological:  Leaning to right. Vomiting. Moves all 4's. Limited eye contact---i did not push him on exam as he was feeling so poorly.  Psychiatric:  Distracted, distressed     Lab Results Last 24 Hours    Results for orders placed or performed during the hospital encounter of 07/27/14 (from the past 24 hour(s))  Glucose, capillary Status: Abnormal   Collection Time: 07/28/14 3:55 PM  Result Value Ref Range   Glucose-Capillary 356 (H) 70 - 99 mg/dL  Glucose, capillary Status: Abnormal   Collection Time: 07/28/14 8:15 PM  Result Value Ref Range   Glucose-Capillary 453 (H) 70 - 99 mg/dL  Glucose, capillary Status: Abnormal   Collection Time: 07/28/14 10:01 PM  Result Value Ref Range   Glucose-Capillary 447 (H) 70 - 99 mg/dL  Glucose,  capillary Status: Abnormal   Collection Time: 07/28/14 11:27 PM  Result Value Ref Range   Glucose-Capillary 423 (H) 70 - 99 mg/dL  Glucose, capillary Status: Abnormal   Collection Time: 07/29/14 12:12 AM  Result Value Ref Range   Glucose-Capillary 453 (H) 70 - 99 mg/dL  Glucose, capillary Status: Abnormal   Collection Time: 07/29/14 4:05 AM  Result Value Ref Range   Glucose-Capillary 390 (H) 70 - 99 mg/dL  CBC Status: Abnormal   Collection Time: 07/29/14 5:36 AM  Result Value Ref Range   WBC 21.5 (H) 4.0 - 10.5 K/uL   RBC 5.15 4.22 - 5.81 MIL/uL   Hemoglobin 15.4 13.0 - 17.0 g/dL   HCT 44.8 39.0 - 52.0 %   MCV 87.0 78.0 - 100.0 fL   MCH 29.9 26.0 - 34.0 pg   MCHC 34.4 30.0 - 36.0 g/dL   RDW 13.1 11.5 - 15.5 %   Platelets 161 150 - 400 K/uL  Magnesium Status: None   Collection Time: 07/29/14 5:36 AM  Result Value Ref Range   Magnesium 2.0 1.5 - 2.5 mg/dL  Comprehensive metabolic panel Status: Abnormal   Collection Time: 07/29/14 5:36 AM  Result Value Ref Range   Sodium 135 135 - 145 mmol/L   Potassium 4.0 3.5 - 5.1 mmol/L   Chloride 105 96 - 112 mmol/L   CO2 17 (L) 19 - 32 mmol/L   Glucose, Bld 372 (H) 70 - 99 mg/dL   BUN 54 (H) 6 - 23 mg/dL   Creatinine, Ser 2.61 (H) 0.50 -  1.35 mg/dL   Calcium 8.8 8.4 - 10.5 mg/dL   Total Protein 5.6 (L) 6.0 - 8.3 g/dL   Albumin 3.2 (L) 3.5 - 5.2 g/dL   AST 29 0 - 37 U/L   ALT 22 0 - 53 U/L   Alkaline Phosphatase 94 39 - 117 U/L   Total Bilirubin 1.6 (H) 0.3 - 1.2 mg/dL   GFR calc non Af Amer 27 (L) >90 mL/min   GFR calc Af Amer 32 (L) >90 mL/min   Anion gap 13 5 - 15  Glucose, capillary Status: Abnormal   Collection Time: 07/29/14 7:31 AM  Result Value Ref Range   Glucose-Capillary 295 (H) 70 - 99 mg/dL  Glucose, capillary Status:  Abnormal   Collection Time: 07/29/14 12:09 PM  Result Value Ref Range   Glucose-Capillary 395 (H) 70 - 99 mg/dL      Imaging Results (Last 48 hours)    Mr Brain Wo Contrast  07/28/2014 CLINICAL DATA: Acute onset dizziness beginning at 1 p.m. yesterday afternoon, followed by multiple episodes of vomiting and epigastric pain. LEFT temporal headache for few days without visual disturbance. History of hypertension and diabetes. EXAM: MRI HEAD WITHOUT CONTRAST TECHNIQUE: Multiplanar, multiecho pulse sequences of the brain and surrounding structures were obtained without intravenous contrast. COMPARISON: None. FINDINGS: 9 mm ovoid area reduced diffusion within LEFT mesial brachium pontis with corresponding low ADC value. No susceptibility artifact to suggest hemorrhage. The ventricles and sulci are normal for patient's age. No midline shift, mass effect or mass lesions. A few subcentimeter supratentorial white matter T2 hyperintensities, nonspecific and within normal range for patient's age. No abnormal extra-axial fluid collections. No extra-axial masses though, contrast enhanced sequences would be more sensitive. Normal major intracranial vascular flow voids seen at the skull base. Ocular globes and orbital contents are unremarkable though not tailored for evaluation. Status post apparent RIGHT ocular lens implant. No abnormal sellar expansion. Mild paranasal sinus mucosal thickening without air-fluid levels. Trace LEFT mastoid effusion. No suspicious calvarial bone marrow signal. No abnormal sellar expansion. Craniocervical junction maintained. 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 Otherwise normal MRI of the brain without contrast for age. Mild paranasal sinusitis, trace LEFT mastoid effusion. Electronically Signed By: Elon Alas On: 07/28/2014 03:19   US Renal  07/28/2014 CLINICAL DATA: Acute renal failure. Evaluate for obstructive lesion.  EXAM: RENAL/URINARY TRACT ULTRASOUND COMPLETE COMPARISON: None. FINDINGS: Right Kidney: Length: 9.8 cm. Echogenicity within normal limits. No mass or hydronephrosis visualized. Left Kidney: Length: 9.5 cm. Echogenicity within normal limits. No mass or hydronephrosis visualized. Bladder: Appears normal for degree of bladder distention. IMPRESSION: Negative. Electronically Signed By: Rolla Flatten M.D. On: 07/28/2014 15:03   Dg Chest Port 1 View  07/28/2014 CLINICAL DATA: Nausea and vomiting EXAM: PORTABLE CHEST - 1 VIEW COMPARISON: None. FINDINGS: The heart size and mediastinal contours are within normal limits. Both lungs are clear. The visualized skeletal structures are unremarkable. IMPRESSION: No active disease. Electronically Signed By: Inez Catalina M.D. On: 07/28/2014 09:42     Assessment/Plan: Diagnosis: left pontine infarct 1. Does the need for close, 24 hr/day medical supervision in concert with the patient's rehab needs make it unreasonable for this patient to be served in a less intensive setting? Yes 2. Co-Morbidities requiring supervision/potential complications: dm, htn, n/v/d 3. Due to bladder management, bowel management, safety, skin/wound care, disease management, medication administration, pain management and patient education, does the patient require 24 hr/day rehab nursing? Yes 4. Does the patient require coordinated care of a  physician, rehab nurse, PT (1-2 hrs/day, 5 days/week) and OT (1-2 hrs/day, 5 days/week) to address physical and functional deficits in the context of the above medical diagnosis(es)? Yes Addressing deficits in the following areas: balance, endurance, locomotion, strength, transferring, bowel/bladder control, bathing, dressing, feeding, grooming, toileting and psychosocial support 5. Can the patient actively participate in an intensive therapy program of at least 3 hrs of therapy per day at least 5 days per week? Yes 6. The  potential for patient to make measurable gains while on inpatient rehab is excellent 7. Anticipated functional outcomes upon discharge from inpatient rehab are modified independent with PT, modified independent with OT, n/a with SLP. 8. Estimated rehab length of stay to reach the above functional goals is: 7 days potentially 9. Does the patient have adequate social supports and living environment to accommodate these discharge functional goals? Yes 10. Anticipated D/C setting: Home 11. Anticipated post D/C treatments: [facility] therapy 12. Overall Rehab/Functional Prognosis: excellent  RECOMMENDATIONS: This patient's condition is appropriate for continued rehabilitative care in the following setting: CIR, HH Therapy and Outpatient Therapy Patient has agreed to participate in recommended program. Potentially Note that insurance prior authorization may be required for reimbursement for recommended care.  Comment: Will follow along as headaches,nausea, vomiting, dizziness improve. I suspect that he ultimately may only need outpt vs HH services. Rehab Admissions Coordinator to follow up.  Thanks,  Meredith Staggers, MD, Mellody Drown     07/29/2014       Revision History     Date/Time User Provider Type Action   07/29/2014 3:27 PM Meredith Staggers, MD Physician Sign   07/29/2014 1:36 PM Bary Leriche, PA-C Physician Assistant Pend   View Details Report       Routing History     Date/Time From To Method   07/29/2014 3:27 PM Meredith Staggers, MD Meredith Staggers, MD In Basket

## 2014-08-03 NOTE — Progress Notes (Signed)
Physical Therapy Treatment Patient Details Name: Adrian Neal MRN: [id number] DOB: [birth year] Today's Date: 08/03/2014    History of Present Illness Pt. was admitted 07/27/14 with dizziness and nausea, falling toward left.   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  Pt. with PMH of poorly controlled diabetes (he reports being diabetic since age 49) and HTN.  Pt. wears an insulin pump normally.    PT Comments    Emphasis on w/shift, dissociation of upper and lower body, heel/toe gait pattern.  Ready for the intensity of CIR.  Follow Up Recommendations  CIR     Equipment Recommendations       Recommendations for Other Services       Precautions / Restrictions Precautions Precautions: Fall Restrictions Weight Bearing Restrictions: No    Mobility  Bed Mobility                  Transfers Overall transfer level: Needs assistance   Transfers: Sit to/from Stand Sit to Stand: Min assist Stand pivot transfers: Min assist       General transfer comment: slow and generally symetrical  Ambulation/Gait Ambulation/Gait assistance: Min assist;Mod assist Ambulation Distance (Feet): 120 Feet Assistive device: 1 person hand held assist (rail) Gait Pattern/deviations: Step-through pattern Gait velocity: slow and guarded   General Gait Details: guarded with need for a little more w/shift to R, but able to work on heel/toe gait, w/shift and coordination of advancing L> R LE without heavy stabilization with the UE's   Stairs            Wheelchair Mobility    Modified Rankin (Stroke Patients Only) Modified Rankin (Stroke Patients Only) Pre-Morbid Rankin Score: No symptoms Modified Rankin: Moderately severe disability     Balance Overall balance assessment: Needs assistance Sitting-balance support: No upper extremity supported Sitting balance-Leahy Scale: Good     Standing balance support: No upper extremity supported Standing balance-Leahy Scale:  Fair                      Cognition Arousal/Alertness: Awake/alert Behavior During Therapy: WFL for tasks assessed/performed Overall Cognitive Status: Within Functional Limits for tasks assessed                      Exercises      General Comments        Pertinent Vitals/Pain Pain Assessment: No/denies pain    Home Living   Living Arrangements: Spouse/significant other;Children (18, 49 and 52 yo)                  Prior Function            PT Goals (current goals can now be found in the care plan section) Acute Rehab PT Goals Patient Stated Goal: to get better  PT Goal Formulation: With patient Time For Goal Achievement: 08/05/14 Potential to Achieve Goals: Good Progress towards PT goals: Progressing toward goals    Frequency  Min 4X/week    PT Plan Current plan remains appropriate    Co-evaluation             End of Session   Activity Tolerance: Patient tolerated treatment well Patient left: with call bell/phone within reach;with family/visitor present (sitting EOB)     Time: [id number] PT Time Calculation (min) (ACUTE ONLY): 19 min  Charges:  $Gait Training: 8-22 mins  G Codes:      Ayven Pheasant, Tessie Fass 08/03/2014, 6:02 PM 08/03/2014  Donnella Sham, [place] [phone]  (pager)

## 2014-08-03 NOTE — Progress Notes (Signed)
Cristina Gong, RN Rehab Admission Coordinator Signed Physical Medicine and Rehabilitation PMR Pre-admission 08/03/2014 3:53 PM  Related encounter: ED to Hosp-Admission (Current) from 07/27/2014 in [facility]   PMR Admission Coordinator Pre-Admission Assessment  Patient: Adrian Neal is an 49 y.o., male MRN: [id number] DOB: 01/08/1966 Height: 5\' 8"  (172.7 cm) Weight: 76.8 kg (169 lb 5 oz)  Insurance Information HMO: PPO: PCP: IPA: 80/20: OTHER: [employer] PRIMARY: [place] Policy#: [id number] Subscriber: pt CM Name: Ferdinand Lango Phone#: [phone] Fax#: [phone] update in 7 days when Chanelle will call to give name of CM assigned for AIR stay  Pre-Cert#: [id number] Employer: [employer] Benefits: Phone #: [phone] Name: 08/02/14 Eff. Date: 04/29/14 Deduct: $800 Out of Pocket Max: $1600 Life Max: none CIR: 80% SNF: 80% 60 days Outpatient: $20 copay per visit Co-Pay: 23 visits PT and OT each, 30 visits SLP Home Health: 80% Co-Pay: 20% DME: 80% Co-Pay: 20% Providers: in network  SECONDARY: none  Medicaid Application Date: Case Manager:  Disability Application Date: Case Worker:   Emergency Facilities manager Information    Name Relation Home Work [place] Other [phone]       Current Medical History  Patient Admitting Diagnosis: left pontine cva  History of Present Illness: Adrian Neal is a 49 y.o. male with history of DM and HTN who was admitted on 03/30 with complaints of dizziness, nausea and difficulty walking. MRI of brain done revealing acute left pontine infarct. Carotid dopplers without significant  ICA stenosis. Neurology consulted for input and recommended ASA for secondary stroke prevention. 2D echo done today. PT evaluation done revealing staggering gait with LLE ataxia.  Total: 1 NIH    Past Medical History  Past Medical History  Diagnosis Date  . Hypertension   . Diabetes mellitus without complication     diagnosed at age 39  . 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  Prior Rehab/Hospitalizations: none  Current Medications   Current facility-administered medications:  . acetaminophen (TYLENOL) tablet 650 mg, 650 mg, Oral, Q6H PRN, 650 mg at 07/30/14 1430 **OR** acetaminophen (TYLENOL) suppository 650 mg, 650 mg, Rectal, Q6H PRN, Theressa Millard, MD . amLODipine (NORVASC) tablet 10 mg, 10 mg, Oral, Daily, Florencia Reasons, MD, 10 mg at 08/03/14 1100 . aspirin EC tablet 325 mg, 325 mg, Oral, Daily, Florencia Reasons, MD, 325 mg at 08/03/14 1100 . atorvastatin (LIPITOR) tablet 20 mg, 20 mg, Oral, q1800, Karren Cobble, RPH, 20 mg at 08/02/14 1709 . carvedilol (COREG) tablet 25 mg, 25 mg, Oral, BID WC, Florencia Reasons, MD, 25 mg at 08/03/14 [id number] . chlorproMAZINE (THORAZINE) tablet 10 mg, 10 mg, Oral, TID PRN, Florencia Reasons, MD . enoxaparin (LOVENOX) injection 40 mg, 40 mg, Subcutaneous, Daily, Karren Cobble, RPH, 40 mg at 08/03/14 1100 . insulin pump, , Subcutaneous, 6 times per day, Florencia Reasons, MD, 3 each at 08/03/14 0800 . lisinopril (PRINIVIL,ZESTRIL) tablet 2.5 mg, 2.5 mg, Oral, Daily, Florencia Reasons, MD, 2.5 mg at 08/03/14 1100 . meclizine (ANTIVERT) tablet 25 mg, 25 mg, Oral, TID PRN, Theressa Millard, MD, 25 mg at 07/28/14 2342 . metoCLOPramide (REGLAN) injection 10 mg, 10 mg, Intravenous, 4 times per day, Florencia Reasons, MD, 10 mg at 08/03/14  0503 . omega-3 acid ethyl esters (LOVAZA)  capsule 1 g, 1 g, Oral, BID, Theressa Millard, MD, 1 g at 08/03/14 1101 . 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[place] Devices/Equipment: None (insulin pump) Home Equipment: None  Prior Functional Level Prior Function Level of Independence: Independent Comments: drives, works as Freight forwarder at [employer] in [place]  Overall Cognitive Status: Within Functional Limits for tasks assessed Orientation Level: Oriented X4 Memory: Appears intact Awareness: Appears intact Problem Solving: Appears intact Safety/Judgment: Appears intact   Extremity Assessment (includes Sensation/Coordination)  Upper Extremity Assessment: LUE deficits/detail LUE Deficits / Details: ataxia Lt UE LUE Sensation: decreased proprioception LUE Coordination: decreased fine motor, decreased gross motor  Lower Extremity Assessment: Defer to PT evaluation LLE Deficits / Details: slight weakness in L LE noted compared to right , no worse than 4+/5 LLE Sensation: (history of diabetes 45 yeas, detects light touch) LLE Coordination: decreased gross motor    ADLs  Overall ADL's : Needs assistance/impaired Eating/Feeding: Sitting, Set up Grooming: Wash/dry hands, Cueing for safety, Standing, Minimal assistance (to steady at the sink) Grooming Details (indicate cue type and reason): Reliant on UE support for balance. Pt leans forward on elbow on counter of sink to support self. occasional LOB Upper Body Bathing: Minimal assitance, Sitting Lower Body Bathing: Moderate  assistance, Sit to/from stand Upper Body Dressing : Minimal assistance, Sitting Lower Body Dressing: Moderate assistance, Sit to/from stand Toilet Transfer: Minimal assistance, Ambulation, Cueing for safety, Cueing for sequencing Toilet Transfer Details (indicate cue type and reason): Increased time with use of RW. If not using RW, pt requires mod A +2 to ambulate safely due to ataxic gait Toileting- Clothing Manipulation and Hygiene: Minimal assistance, Sit to/from stand (for clothing manipulation) Functional mobility during ADLs: Minimal assistance, Rolling walker, Cueing for sequencing, Cueing for safety (Mod A +2 without RW) General ADL Comments: significatn ataxia ontinues. Pt states dizziness is a little better.    Mobility  Overal bed mobility: Needs Assistance Bed Mobility: Supine to Sit Supine to sit: Supervision Sit to supine: Min guard General bed mobility comments: using gaze stabilization taught earlier    Transfers  Overall transfer level: Needs assistance Equipment used: Rolling walker (2 wheeled), None Transfers: Sit to/from Stand Sit to Stand: Min assist Stand pivot transfers: Mod assist General transfer comment: slow and generally symetrical    Ambulation / Gait / Stairs / Wheelchair Mobility  Ambulation/Gait Ambulation/Gait assistance: Min assist, Min guard Ambulation Distance (Feet): 140 Feet (total, 50 feet with rail and/or min HHA) Assistive device: None, Rolling walker (2 wheeled) Gait Pattern/deviations: Step-through pattern, Step-to pattern, Decreased step length - right, Decreased stance time - left, Decreased stride length Gait velocity: slow and guarded Gait velocity interpretation: Below normal speed for age/gender General Gait Details: pt progressed from a stiff straight=-legged gait to a more heel/toe pattern. Changed from RW to just rail to work on trunk control and pelvic translation forward. Pt has problems when he loses contact with the  rail and patterns degrade to get to the next hand hold.    Posture / Balance Dynamic Sitting Balance Sitting balance - Comments:  sat well in midline, could scan me in the room, but not accept challenge Balance Overall balance assessment: Needs assistance Sitting-balance support: No upper extremity supported Sitting balance-Leahy Scale: Good Sitting balance - Comments: sat well in midline, could scan me in the room, but not accept challenge Postural control: Other (comment) (pt. feels he leans left when he is actually leaning right) Standing balance support: No upper extremity supported, Bilateral upper extremity supported Standing balance-Leahy Scale: Fair Standing balance comment: static only and then feels much more confident holding to something.    Special needs/care consideration  Bowel mgmt: continent Bladder mgmt: continent Diabetic mgmt insulin pump   Previous Home Environment Living Arrangements: Spouse/significant other, Children (32, 55 and 62 yo) Lives With: Spouse, Daughter, Son Available Help at Discharge: Family, Available 24 hours/day Type of Home: House Home Layout: Two level Alternate Level Stairs-Rails: Right, Left, Can reach both Alternate Level Stairs-Number of Steps: flight Home Access: Level entry Bathroom Shower/Tub: Tub/shower unit, Architectural technologist: Standard Bathroom Accessibility: Yes How Accessible: Accessible via walker [place]: No  Discharge Living Setting Plans for Discharge Living Setting: Patient's home, Lives with (comment), Other (Comment) (wife and three children) Type of Home at Discharge: House Discharge Home Layout: Two level Alternate Level Stairs-Rails: Right, Left, Can reach both Alternate Level Stairs-Number of Steps: flight Discharge Home Access: Level entry Discharge Bathroom Shower/Tub: Tub/shower unit, Curtain Discharge Bathroom Toilet: Standard Discharge Bathroom Accessibility: Yes How Accessible:  Accessible via walker Does the patient have any problems obtaining your medications?: No  Social/Family/Support Systems Patient Roles: Spouse, Parent, Other (Comment) (employee) Contact Information: Estill Bamberg, wife Anticipated Caregiver: wife Anticipated Ambulance person Information: see above Ability/Limitations of Caregiver: no limitations Caregiver Availability: 24/7 Discharge Plan Discussed with Primary Caregiver: Yes Is Caregiver In Agreement with Plan?: Yes Does Caregiver/Family have Issues with Lodging/Transportation while Pt is in Rehab?: No  Goals/Additional Needs Patient/Family Goal for Rehab: Mod I with PT and OT Expected length of stay: [facility] 7 days Equipment Needs: insulin pump Pt/Family Agrees to Admission and willing to participate: Yes Program Orientation Provided & Reviewed with Pt/Caregiver Including Roles & Responsibilities: Yes  Decrease burden of Care through IP rehab admission: n/a  Possible need for SNF placement upon discharge: not expected  Patient Condition: This patient's medical and functional status has changed since the consult dated: 07/29/14 in which the Rehabilitation Physician determined and documented that the patient's condition is appropriate for intensive rehabilitative care in an inpatient rehabilitation facility or Home health depending on his functional recovery over the weekend. See "History of Present Illness" (above) for medical update. Functional changes are: min assist. Patient's medical and functional status update has been discussed with the Rehabilitation physician and patient is felt to be appropriate for inpatient rehabilitation. Will admit to inpatient rehab today.  Preadmission Screen Completed By: Cleatrice Burke, 08/03/2014 4:01 PM ______________________________________________________________________  Discussed status with Dr. Naaman Plummer on 08/03/14 at 1600 and received telephone approval for admission today.  Admission  Coordinator: Cleatrice Burke, time 1600 Date 08/03/14          Cosigned by: Meredith Staggers, MD at 08/03/2014 4:03 PM  Revision History     Date/Time User Provider Type Action   08/03/2014 4:03 PM Meredith Staggers, MD Physician Cosign   08/03/2014 4:01 PM Cristina Gong, RN Rehab Admission Coordinator Sign

## 2014-08-03 NOTE — PMR Pre-admission (Signed)
PMR Admission Coordinator Pre-Admission Assessment  Patient: Adrian Neal is an 49 y.o., male MRN: [id number] DOB: Dec 11, 1965 Height: 5\' 8"  (172.7 cm) Weight: 76.8 kg (169 lb 5 oz)              Insurance Information HMO:     PPO:      PCP:      IPA:      80/20:      OTHER: [employer] PRIMARY: [place]      Policy#: [id number]      Subscriber: pt CM Name: Ferdinand Lango      Phone#: [phone]     Fax#: [phone] update in 7 days when Chanelle will call to give name of CM assigned for AIR stay  Pre-Cert#: [id number]      Employer: [employer] Benefits:  Phone #: [phone]     Name: 08/02/14 Eff. Date: 04/29/14     Deduct: $800      Out of Pocket Max: $1600      Life Max: none CIR: 80%      SNF: 80% 60 days Outpatient: $20 copay per visit     Co-Pay: 23 visits PT and OT each, 30 visits SLP Home Health: 80%      Co-Pay: 20% DME: 80%     Co-Pay: 20% Providers: in network  SECONDARY: none       Medicaid Application Date:       Case Manager:  Disability Application Date:       Case Worker:   Emergency Facilities manager Information    Name Relation Home Work [place] Other [phone]       Current Medical History  Patient Admitting Diagnosis: left pontine cva  History of Present Illness: Adrian Neal is a 49 y.o. male with history of DM and HTN who was admitted on 03/30 with complaints of dizziness, nausea and difficulty walking. MRI of brain done revealing acute left pontine infarct. Carotid dopplers without significant ICA stenosis. Neurology consulted for input and recommended ASA for secondary stroke prevention. 2D echo done today. PT evaluation done revealing staggering gait with LLE ataxia.   Total: 1 NIH    Past Medical History  Past Medical History  Diagnosis Date  . Hypertension   . Diabetes mellitus without complication     diagnosed at age 20  . Retinopathy due to secondary diabetes mellitus     right     Family History  family history includes Diabetes in his father; Hyperlipidemia in his father and mother; Hypertension in his father and mother; Stroke in his mother.  Prior Rehab/Hospitalizations: none  Current Medications   Current facility-administered medications:  .  acetaminophen (TYLENOL) tablet 650 mg, 650 mg, Oral, Q6H PRN, 650 mg at 07/30/14 1430 **OR** acetaminophen (TYLENOL) suppository 650 mg, 650 mg, Rectal, Q6H PRN, Theressa Millard, MD .  amLODipine (NORVASC) tablet 10 mg, 10 mg, Oral, Daily, Florencia Reasons, MD, 10 mg at 08/03/14 1100 .  aspirin EC tablet 325 mg, 325 mg, Oral, Daily, Florencia Reasons, MD, 325 mg at 08/03/14 1100 .  atorvastatin (LIPITOR) tablet 20 mg, 20 mg, Oral, q1800, Karren Cobble, RPH, 20 mg at 08/02/14 1709 .  carvedilol (COREG) tablet 25 mg, 25 mg, Oral, BID WC, Florencia Reasons, MD, 25 mg at 08/03/14 [phone] .  chlorproMAZINE (THORAZINE) tablet 10 mg, 10 mg, Oral, TID PRN, Florencia Reasons, MD .  enoxaparin (LOVENOX) injection 40 mg, 40 mg, Subcutaneous, Daily, Karren Cobble, RPH,  40 mg at 08/03/14 1100 .  insulin pump, , Subcutaneous, 6 times per day, Florencia Reasons, MD, 3 each at 08/03/14 0800 .  lisinopril (PRINIVIL,ZESTRIL) tablet 2.5 mg, 2.5 mg, Oral, Daily, Florencia Reasons, MD, 2.5 mg at 08/03/14 1100 .  meclizine (ANTIVERT) tablet 25 mg, 25 mg, Oral, TID PRN, Theressa Millard, MD, 25 mg at 07/28/14 2342 .  metoCLOPramide (REGLAN) injection 10 mg, 10 mg, Intravenous, 4 times per day, Florencia Reasons, MD, 10 mg at 08/03/14 0503 .  omega-3 acid ethyl esters (LOVAZA) capsule 1 g, 1 g, Oral, BID, Theressa Millard, MD, 1 g at 08/03/14 1101 .  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[place] Devices/Equipment: None (insulin pump) Home Equipment: None  Prior Functional Level Prior Function Level of Independence: Independent Comments: drives, works as  Freight forwarder at [employer] in [place]  Overall Cognitive Status: Within Functional Limits for tasks assessed Orientation Level: Oriented X4 Memory: Appears intact Awareness: Appears intact Problem Solving: Appears intact Safety/Judgment: Appears intact    Extremity Assessment (includes Sensation/Coordination)  Upper Extremity Assessment: LUE deficits/detail LUE Deficits / Details: ataxia Lt UE LUE Sensation: decreased proprioception LUE Coordination: decreased fine motor, decreased gross motor  Lower Extremity Assessment: Defer to PT evaluation LLE Deficits / Details: slight weakness in L LE noted compared to right , no worse than 4+/5 LLE Sensation:  (history of diabetes 45 yeas, detects light touch) LLE Coordination: decreased gross motor    ADLs  Overall ADL's : Needs assistance/impaired Eating/Feeding: Sitting, Set up Grooming: Wash/dry hands, Cueing for safety, Standing, Minimal assistance (to steady at the sink) Grooming Details (indicate cue type and reason): Reliant on UE support for balance.  Pt leans forward on elbow on counter of sink to support self.  occasional LOB Upper Body Bathing: Minimal assitance, Sitting Lower Body Bathing: Moderate assistance, Sit to/from stand Upper Body Dressing : Minimal assistance, Sitting Lower Body Dressing: Moderate assistance, Sit to/from stand Toilet Transfer: Minimal assistance, Ambulation, Cueing for safety, Cueing for sequencing Toilet Transfer Details (indicate cue type and reason): Increased time with use of RW. If not using RW, pt requires mod A +2 to ambulate safely due to ataxic gait Toileting- Clothing Manipulation and  Hygiene: Minimal assistance, Sit to/from stand (for clothing manipulation) Functional mobility during ADLs: Minimal assistance, Rolling walker, Cueing for sequencing, Cueing for safety (Mod A +2 without RW) General ADL Comments: significatn ataxia ontinues. Pt states dizziness is a little better.    Mobility  Overal bed mobility: Needs Assistance Bed Mobility: Supine to Sit Supine to sit: Supervision Sit to supine: Min guard General bed mobility comments: using gaze stabilization taught earlier    Transfers  Overall transfer level: Needs assistance Equipment used: Rolling walker (2 wheeled), None Transfers: Sit to/from Stand Sit to Stand: Min assist Stand pivot transfers: Mod assist General transfer comment: slow and generally symetrical    Ambulation / Gait / Stairs / Wheelchair Mobility  Ambulation/Gait Ambulation/Gait assistance: Min assist, Min guard Ambulation Distance (Feet): 140 Feet (total, 50 feet with rail and/or min HHA) Assistive device: None, Rolling walker (2 wheeled) Gait Pattern/deviations: Step-through pattern, Step-to  pattern, Decreased step length - right, Decreased stance time - left, Decreased stride length Gait velocity: slow and guarded Gait velocity interpretation: Below normal speed for age/gender General Gait Details: pt progressed from a stiff straight=-legged gait to a more heel/toe pattern.  Changed from RW to just rail to work on trunk control and pelvic translation forward.  Pt has problems when he loses contact with the rail and patterns degrade to get to the next hand hold.    Posture / Balance Dynamic Sitting Balance Sitting balance - Comments: sat well in midline, could scan me in the room, but not accept challenge Balance Overall balance assessment: Needs assistance Sitting-balance support: No upper extremity supported Sitting balance-Leahy Scale: Good Sitting balance - Comments: sat well in midline, could scan me in the room, but not accept  challenge Postural control: Other (comment) (pt. feels he leans left when he is actually leaning right) Standing balance support: No upper extremity supported, Bilateral upper extremity supported Standing balance-Leahy Scale: Fair Standing balance comment: static only and then feels much more confident holding to something.    Special needs/care consideration  Bowel mgmt: continent Bladder mgmt: continent Diabetic mgmt insulin pump   Previous Home Environment Living Arrangements: Spouse/significant other, Children (71, 51 and 81 yo)  Lives With: Spouse, Daughter, Son Available Help at Discharge: Family, Available 24 hours/day Type of Home: House Home Layout: Two level Alternate Level Stairs-Rails: Right, Left, Can reach both Alternate Level Stairs-Number of Steps: flight Home Access: Level entry Bathroom Shower/Tub: Tub/shower unit, Architectural technologist: Standard Bathroom Accessibility: Yes How Accessible: Accessible via walker [place]e: No  Discharge Living Setting Plans for Discharge Living Setting: Patient's home, Lives with (comment), Other (Comment) (wife and three children) Type of Home at Discharge: House Discharge Home Layout: Two level Alternate Level Stairs-Rails: Right, Left, Can reach both Alternate Level Stairs-Number of Steps: flight Discharge Home Access: Level entry Discharge Bathroom Shower/Tub: Tub/shower unit, Curtain Discharge Bathroom Toilet: Standard Discharge Bathroom Accessibility: Yes How Accessible: Accessible via walker Does the patient have any problems obtaining your medications?: No  Social/Family/Support Systems Patient Roles: Spouse, Parent, Other (Comment) (employee) Contact Information: Estill Bamberg, wife Anticipated Caregiver: wife Anticipated Ambulance person Information: see above Ability/Limitations of Caregiver: no limitations Caregiver Availability: 24/7 Discharge Plan Discussed with Primary Caregiver: Yes Is Caregiver In  Agreement with Plan?: Yes Does Caregiver/Family have Issues with Lodging/Transportation while Pt is in Rehab?: No  Goals/Additional Needs Patient/Family Goal for Rehab: Mod I with PT and OT Expected length of stay: ELOS 7 days Equipment Needs: insulin pump Pt/Family Agrees to Admission and willing to participate: Yes Program Orientation Provided & Reviewed with Pt/Caregiver Including Roles  & Responsibilities: Yes  Decrease burden of Care through IP rehab admission: n/a  Possible need for SNF placement upon discharge: not expected  Patient Condition: This patient's medical and functional status has changed since the consult dated: 07/29/14  in which the Rehabilitation Physician determined and documented that the patient's condition is appropriate for intensive rehabilitative care in an inpatient rehabilitation facility or Home health depending on his functional recovery over the weekend.  See "History of Present Illness" (above) for medical update. Functional changes are: min assist. Patient's medical and functional status update has been discussed with the Rehabilitation physician and patient is felt to be  appropriate for inpatient rehabilitation. Will admit to inpatient rehab today.  Preadmission Screen Completed By:  Cleatrice Burke, 08/03/2014 4:01 PM ______________________________________________________________________   Discussed status with Dr. Naaman Plummer on 08/03/14 at  1600 and received telephone approval for admission today.  Admission Coordinator:  Cleatrice Burke, time 1600 Date 08/03/14

## 2014-08-03 NOTE — Progress Notes (Addendum)
I have insurance approval and bed available on inpt rehab today. Dr. Broadus John is in agreement to d/c today. Pt is aware as well as RN CM and SW. I will make the arrangements. [id number]

## 2014-08-04 ENCOUNTER — Inpatient Hospital Stay (HOSPITAL_COMMUNITY): Payer: 59

## 2014-08-04 DIAGNOSIS — R270 Ataxia, unspecified: Secondary | ICD-10-CM | POA: Diagnosis present

## 2014-08-04 DIAGNOSIS — I6339 Cerebral infarction due to thrombosis of other cerebral artery: Secondary | ICD-10-CM

## 2014-08-04 LAB — CBC WITH DIFFERENTIAL/PLATELET
BASOS ABS: 0 10*3/uL (ref 0.0–0.1)
Basophils Relative: 0 % (ref 0–1)
Eosinophils Absolute: 0.2 10*3/uL (ref 0.0–0.7)
Eosinophils Relative: 3 % (ref 0–5)
HCT: 40.7 % (ref 39.0–52.0)
Hemoglobin: 14 g/dL (ref 13.0–17.0)
LYMPHS ABS: 1.2 10*3/uL (ref 0.7–4.0)
Lymphocytes Relative: 15 % (ref 12–46)
MCH: 29.5 pg (ref 26.0–34.0)
MCHC: 34.4 g/dL (ref 30.0–36.0)
MCV: 85.9 fL (ref 78.0–100.0)
Monocytes Absolute: 0.7 10*3/uL (ref 0.1–1.0)
Monocytes Relative: 8 % (ref 3–12)
Neutro Abs: 6.2 10*3/uL (ref 1.7–7.7)
Neutrophils Relative %: 74 % (ref 43–77)
PLATELETS: 138 10*3/uL — AB (ref 150–400)
RBC: 4.74 MIL/uL (ref 4.22–5.81)
RDW: 12.6 % (ref 11.5–15.5)
WBC: 8.3 10*3/uL (ref 4.0–10.5)

## 2014-08-04 LAB — GLUCOSE, CAPILLARY
GLUCOSE-CAPILLARY: 137 mg/dL — AB (ref 70–99)
Glucose-Capillary: 296 mg/dL — ABNORMAL HIGH (ref 70–99)
Glucose-Capillary: 226 mg/dL — ABNORMAL HIGH (ref 70–99)
Glucose-Capillary: 169 mg/dL — ABNORMAL HIGH (ref 70–99)
Glucose-Capillary: 193 mg/dL — ABNORMAL HIGH (ref 70–99)

## 2014-08-04 LAB — COMPREHENSIVE METABOLIC PANEL
ALBUMIN: 3 g/dL — AB (ref 3.5–5.2)
ALT: 134 U/L — ABNORMAL HIGH (ref 0–53)
ANION GAP: 10 (ref 5–15)
AST: 70 U/L — ABNORMAL HIGH (ref 0–37)
Alkaline Phosphatase: 101 U/L (ref 39–117)
BUN: 18 mg/dL (ref 6–23)
CO2: 23 mmol/L (ref 19–32)
CREATININE: 1.57 mg/dL — AB (ref 0.50–1.35)
Calcium: 8.6 mg/dL (ref 8.4–10.5)
Chloride: 105 mmol/L (ref 96–112)
GFR calc Af Amer: 59 mL/min — ABNORMAL LOW (ref >90)
GFR calc non Af Amer: 51 mL/min — ABNORMAL LOW (ref >90)
Glucose, Bld: 178 mg/dL — ABNORMAL HIGH (ref 70–99)
Potassium: 3.8 mmol/L (ref 3.5–5.1)
SODIUM: 138 mmol/L (ref 135–145)
TOTAL PROTEIN: 5.5 g/dL — AB (ref 6.0–8.3)
Total Bilirubin: 0.8 mg/dL (ref 0.3–1.2)

## 2014-08-04 NOTE — Evaluation (Signed)
Physical Therapy Assessment and Plan  Patient Details  Name: Adrian Neal MRN: [id number] Date of Birth: [birth year]  PT Diagnosis: Ataxic gait and Difficulty walking Rehab Potential: Excellent ELOS: 7 to 10 days   Today's Date: 08/04/2014 PT Individual Time: 1000-1100 PT Individual Time Calculation (min): 60 min    Problem List:  Patient Active Problem List   Diagnosis Date Noted  . Ataxia   . CVA (cerebral infarction) 08/03/2014  . ARF (acute renal failure)   . Insulin dependent diabetes mellitus   . 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 Intractable nausea and vomiting 07/27/2014  . Vertigo 07/27/2014  . Essential hypertension 07/27/2014  . Type 1 diabetes mellitus 05/29/2010  . Hyperlipidemia 05/29/2010  . HYPERTENSION 05/29/2010  . 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 Hypertension   . Diabetes mellitus without complication     diagnosed at age 57  . Retinopathy due to secondary diabetes mellitus     right   Past Surgical History:  Past Surgical History  Procedure Laterality Date  . Eye surgery  1990    for retinopathy   . Cataract extraction w/ intraocular lens implant  1994    Assessment & Plan Clinical Impression: Gillermo is a 49 y.o. male with history of DM type 1 with diabetic retinopathy and HTN who was admitted on 07/27/14 with complaints of dizziness, nausea and difficulty walking. MRI of brain done revealing acute left pontine infarct. Carotid dopplers without significant ICA stenosis. Neurology consulted for input and recommended ASA for secondary stroke prevention. 2D echo revealed EF 60-65% with mild LVH and grade 2 diastolic dysfunction. PT evaluation done revealing staggering gait with LLE ataxia. Patient had had worsening of renal status and renal ultrasound negative. Acute renal failure improve with hydration and blood pressures slowly improving. Intractable nausea and  vomiting improving with IV Reglan. CIR was recommended by MD/ rehab team and patient transferred to CIR on 08/03/2014 .   Patient currently requires mod with mobility secondary to impaired timing and sequencing and ataxia and decreased standing balance and decreased balance strategies.  Prior to hospitalization, patient was independent  with mobility and lived with Spouse, Son, Daughter in a House home.  Home access is 5 to 6 steps spread out with sidewalkStairs to enter.  Patient will benefit from skilled PT intervention to maximize safe functional mobility, minimize fall risk and decrease caregiver burden for planned discharge home with intermittent assist.  Anticipate patient will benefit from follow up OP at discharge.  PT - End of Session Endurance Deficit: No PT Assessment Rehab Potential (ACUTE/IP ONLY): Excellent PT Patient demonstrates impairments in the following area(s): Balance PT Transfers Functional Problem(s): Bed Mobility;Bed to Chair;Car;Floor PT Locomotion Functional Problem(s): Ambulation;Stairs PT Plan PT Intensity: Minimum of 1-2 x/day ,45 to 90 minutes PT Frequency: 5 out of 7 days PT Duration Estimated Length of Stay: 7 to 10 days PT Treatment/Interventions: Ambulation/gait training;Balance/vestibular training;Discharge planning;Functional mobility training;Neuromuscular re-education;Patient/family education;Stair training;Therapeutic Activities;Therapeutic Exercise;UE/LE Strength taining/ROM;UE/LE Coordination activities;Wheelchair propulsion/positioning PT Transfers Anticipated Outcome(s): mod I with transfers PT Locomotion Anticipated Outcome(s): mod I household ambulation with LRAD; supervision stairs and community PT Recommendation Recommendations for Other Services: Vestibular eval Follow Up Recommendations: Outpatient PT Patient destination: Home Equipment Recommended: To be determined (likely rolling walker)  Skilled Therapeutic Intervention Patient sitting in  wheelchair finishing with OT upon entering room. Patient propelled wheelchair with supervision/cueing to gym 150 feet using bilateral UE's. Patient  participated in evaluation and beginning of treatment. Patient ambulated with RW 150 feet x 2with facilitation at hips for weight shifting and moderate cueing for gait pattern (see below). Patient tends to take shorter step with left LE and have very narrow base of support. Patient ambulated 150 feet using railing in hallway and HHA working on gait pattern. Patient ambulated up and down 12 steps with bilateral rails and mod assist/cueing. BERG performed and pt scored 24/56 indicating high fall risk. Patient reported having to use the bathroom and was transferred to toilet with min steady assist. Patient left in bathroom and was instructed to call for assistance when he was done. RN was notified that he was in the bathroom.  PT Evaluation Precautions/Restrictions Precautions Precautions: Fall Restrictions Weight Bearing Restrictions: No General Chart Reviewed: Yes Response to Previous Treatment: Patient with no complaints from previous session. Family/Caregiver Present: No Vital Signs Pain Pain Assessment Pain Assessment: No/denies pain Home Living/Prior Functioning Home Living Available Help at Discharge: Family;Available 24 hours/day Type of Home: House Home Access: Stairs to enter [employer] of Steps: 5 to 6 steps spread out with sidewalk Entrance Stairs-Rails: None Home Layout: Two level Alternate Level Stairs-Number of Steps: 14 Alternate Level Stairs-Rails: Right;Left (on right for first 4 steps and on left for next 10)  Lives With: Spouse;Son;Daughter Prior Function Level of Independence: Independent with gait  Able to Take Stairs?: Yes Driving: Yes Vocation: Full time employment Comments: drives, works as  Freight forwarder at [employer] in [place] Overall Cognitive Status: Within Functional Limits for tasks  assessed Arousal/Alertness: Awake/alert Orientation Level: Oriented X4 Memory: Appears intact Awareness: Appears intact Problem Solving: Appears intact Safety/Judgment: Appears intact Sensation Sensation Light Touch: Appears Intact Stereognosis: Not tested Hot/Cold: Not tested Proprioception: Appears Intact Coordination Gross Motor Movements are Fluid and Coordinated: No Fine Motor Movements are Fluid and Coordinated: Yes Coordination and Movement Description: slightly slower with left Finger Nose Finger Test: slightly slower and required more concentration with left UE than right Heel Shin Test: able to perform bilaterally without problems Motor  Motor Motor: Ataxia  Mobility Transfers Transfers: Yes Sit to Stand: 4: Min assist Stand Pivot Transfers: 4: Min assist Locomotion  Ambulation Ambulation: Yes Ambulation/Gait Assistance: 3: Mod assist Ambulation Distance (Feet): 150 Feet Assistive device: Rolling walker Gait Gait: Yes Gait Pattern: Step-to pattern;Decreased step length - left;Decreased stance time - left;Decreased stride length;Decreased weight shift to right;Left genu recurvatum;Ataxic;Trunk flexed;Narrow base of support Gait velocity: slow and guarded Stairs / Additional Locomotion Stairs: Yes Stairs Assistance: 3: Mod assist Stair Management Technique: Two rails;Step to pattern;Forwards Number of Stairs: Midwife: Yes Wheelchair Assistance: 5: Careers information officer: Both upper extremities Wheelchair Parts Management: Independent;Needs assistance Distance: 150   Balance Balance Balance Assessed: Yes Standardized Balance Assessment Standardized Balance Assessment: Berg Balance Test Berg Balance Test Sit to Stand: Able to stand  independently using hands Standing Unsupported: Able to stand 2 minutes with supervision Sitting with Back Unsupported but Feet Supported on Floor or Stool: Able to sit safely and  securely 2 minutes Stand to Sit: Controls descent by using hands Transfers: Able to transfer with verbal cueing and /or supervision Standing Unsupported with Eyes Closed: Able to stand 10 seconds with supervision Standing Ubsupported with Feet Together: Needs help to attain position and unable to hold for 15 seconds From Standing, Reach Forward with Outstretched Arm: Can reach forward >12 cm safely (5") From Standing Position, Pick up Object from Floor: Unable to try/needs assist  to keep balance From Standing Position, Turn to Look Behind Over each Shoulder: Turn sideways only but maintains balance Turn 360 Degrees: Needs assistance while turning Standing Unsupported, Alternately Place Feet on Step/Stool: Able to complete >2 steps/needs minimal assist Standing Unsupported, One Foot in Front: Loses balance while stepping or standing Standing on One Leg: Unable to try or needs assist to prevent fall Total Score: 24 Extremity Assessment  RUE Assessment RUE Assessment: Within Functional Limits LUE Assessment LUE Assessment: Within Functional Limits RLE Assessment RLE Assessment: Within Functional Limits LLE Assessment LLE Assessment: Within Functional Limits (slightly weaker with dorsiflexion compared to right)  FIM:  FIM - Bed/Chair Transfer Bed/Chair Transfer Assistive Devices: Arm rests;Walker Bed/Chair Transfer: 4: Bed > Chair or W/C: Min A (steadying Pt. > 75%);4: Chair or W/C > Bed: Min A (steadying Pt. > 75%) FIM - Locomotion: Wheelchair Distance: 150 Locomotion: Wheelchair: 5: Travels 150 ft or more: maneuvers on rugs and over door sills with supervision, cueing or coaxing FIM - Locomotion: Ambulation Locomotion: Ambulation Assistive Devices: Administrator Ambulation/Gait Assistance: 3: Mod assist Locomotion: Ambulation: 3: Travels 150 ft or more with moderate assistance (Pt: 50 - 74%) FIM - Locomotion: Stairs Locomotion: Scientist, physiological: Hand rail - 2 Locomotion:  Stairs: 3: Up and Down 12 - 14 stairs with moderate assistance (Pt: 50 - 74%)   Refer to Care Plan for Long Term Goals  Recommendations for other services: None  Discharge Criteria: Patient will be discharged from PT if patient refuses treatment 3 consecutive times without medical reason, if treatment goals not met, if there is a change in medical status, if patient makes no progress towards goals or if patient is discharged from hospital.  The above assessment, treatment plan, treatment alternatives and goals were discussed and mutually agreed upon: by patient  Sanjuana Letters 08/04/2014, 1:29 PM

## 2014-08-04 NOTE — Progress Notes (Signed)
Patient arrived on unit at approx. 2000 via bed, AAOx4, able to make needs known. Denied any pain or discomfort. Pleasant and cooperative with staff. Patient noted with insulin pump attached to left thigh. Saline lock to LFA, flushed and patent. No s/s of acute distress noted. Will continue to monitor during shift.

## 2014-08-04 NOTE — Progress Notes (Signed)
Patient information reviewed and entered into eRehab system by Jesusmanuel Erbes, RN, CRRN, PPS Coordinator.  Information including medical coding and functional independence measure will be reviewed and updated through discharge.    

## 2014-08-04 NOTE — Progress Notes (Signed)
[facility]     PROGRESS NOTE    Subjective/Complaints:   Objective: Vital Signs: Blood pressure 143/67, pulse 77, temperature 98.2 F (36.8 C), temperature source Oral, resp. rate 18, weight 78.4 kg (172 lb 13.5 oz), SpO2 97 %. No results found.  Recent Labs  08/02/14 0720 08/04/14 0730  WBC 6.0 8.3  HGB 13.5 14.0  HCT 39.6 40.7  PLT 105* 138*    Recent Labs  08/02/14 0720 08/03/14 0550  NA 138 139  K 3.2* 3.3*  CL 105 105  GLUCOSE 184* 177*  BUN 16 16  CREATININE 1.49* 1.50*  CALCIUM 8.2* 8.5   CBG (last 3)   Recent Labs  08/03/14 2156 08/04/14 0406 08/04/14 0805  GLUCAP 96 296* 226*    Wt Readings from Last 3 Encounters:  08/03/14 78.4 kg (172 lb 13.5 oz)  07/28/14 76.8 kg (169 lb 5 oz)  05/29/10 80.967 kg (178 lb 8 oz)    Physical Exam:  Constitutional: He is oriented to person, place, and time. He appears well-developed and well-nourished. comfortable HENT: oral mucosa pink and moist Head: Normocephalic and atraumatic.  Eyes: Conjunctivae are normal. Pupils are equal, round, and reactive to light.  Neck: Normal range of motion. Neck supple.  Cardiovascular: Normal rate and regular rhythm. no murmur Respiratory: Effort normal and breath sounds normal. No respiratory distress. He has no wheezes. He exhibits no tenderness.  GI: Soft. Bowel sounds are normal. He exhibits no distension. There is no tenderness.  Musculoskeletal: He exhibits no edema or tenderness.  Neurological: He is alert and oriented to person, place, and time.  Sitting up in chair. Moves all 4's. Mild right ptosis still present. Speech clear with improved volume. Follows commands without difficulty. Ataxia with right finger to nose.  nystagmus laterally 5-6 beats, right and left Psychiatric:  Calm and appropriate Skin: Skin is warm and dry.  Psychiatric: He has a normal mood and affect. His speech is normal and behavior is normal.  Judgment and thought content normal. Cognition and memory are normal.    Assessment/Plan: 1. Functional deficits secondary to left pontine infarct which require 3+ hours per day of interdisciplinary therapy in a comprehensive inpatient rehab setting. Physiatrist is providing close team supervision and 24 hour management of active medical problems listed below. Physiatrist and rehab team continue to assess barriers to discharge/monitor patient progress toward functional and medical goals. FIM:                   Comprehension Comprehension Mode: Auditory Comprehension: 7-Follows complex conversation/direction: With no assist  Expression Expression Mode: Verbal Expression: 7-Expresses complex ideas: With no assist  Social Interaction Social Interaction: 7-Interacts appropriately with others - No medications needed.  Problem Solving Problem Solving: 7-Solves complex problems: Recognizes & self-corrects  Memory Memory: 7-Complete Independence: No helper  Medical Problem List and Plan: 1. Functional deficits secondary to left pontine infarct 2. DVT Prophylaxis/Anticoagulation: Pharmaceutical: Lovenox 3. Pain Management: N/A 4. Mood: Endorses some anxiety due to current illness. LCSW to follow for evaluation and support.  5. Neuropsych: This patient is capable of making decisions on his own behalf. 6. Skin/Wound Care: Routine pressure relief measures. Maintain adequate nutrition and hydration status.  7. Fluids/Electrolytes/Nutrition: Monitor I/O. Encourage po intake.    8. HTN: Monitor BP every 8 hours. Continue  9. CKD?: Monitor lytes for now.  10. Hypokalemia: Likely dilutional. Level pending today.  11. Esophagitis: Due to N/V. Will treat with short course of  carafate and PPI.  12. DM type 2:  CBG's ac/hs. Patient counting carbs and managing insulin pump with appropriate bolus  -RN needs to discuss mgt with patient as he is competent to make decisions regarding  pump   LOS (Days) 1 A FACE TO FACE EVALUATION WAS PERFORMED  Hasan Douse T 08/04/2014 8:49 AM

## 2014-08-04 NOTE — H&P (Addendum)
Physical Medicine and Rehabilitation Admission H&P Late entry for exam and assessment on 08/03/14   Chief Complaint  Patient presents with  . Dizziness and balance deficits.    HPI: Adrian Neal is a 49 y.o. male with history of DM type 1 with diabetic retinopathy and HTN who was admitted on 07/27/14 with complaints of dizziness, nausea and difficulty walking. MRI of brain done revealing acute left pontine infarct. Carotid dopplers without significant ICA stenosis. Neurology consulted for input and recommended ASA for secondary stroke prevention. 2D echo revealed EF 60-65% with mild LVH and grade 2 diastolic dysfunction. PT evaluation done revealing staggering gait with LLE ataxia. Patient had had worsening of renal status and renal ultrasound negative. Acute renal failure improve with hydration and blood pressures slowly improving. Intractable nausea and vomiting improving with IV Reglan. CIR was recommended by MD/ rehab team and patient admitted today.    Review of Systems  HENT: Negative for hearing loss.  Eyes: Positive for blurred vision (right due to retinopathy). Negative for double vision.  Respiratory: Negative for cough and shortness of breath.  Cardiovascular: Negative for chest pain and palpitations.  Gastrointestinal: Positive for heartburn (Burning and pain on swallowing as well as reflux). Negative for nausea, vomiting and constipation.  Genitourinary: Negative for dysuria and frequency.  Musculoskeletal: Negative for myalgias, back pain, joint pain and neck pain.  Neurological: Positive for dizziness (+/- a little better) and headaches. Negative for sensory change.  Psychiatric/Behavioral: The patient is nervous/anxious and has insomnia (uncomfortable bed).      Past Medical History  Diagnosis Date  . Hypertension   . Diabetes mellitus without complication     diagnosed at age 50  . Retinopathy due to secondary diabetes mellitus       right     Past Surgical History  Procedure Laterality Date  . Eye surgery  1990    for retinopathy   . Cataract extraction w/ intraocular lens implant  1994    Family History  Problem Relation Age of Onset  . Stroke Mother   . Hypertension Mother   . Hyperlipidemia Mother   . Hyperlipidemia Father   . Hypertension Father   . Diabetes Father     Social History: Married. 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 He reports that he quit smoking about 23 years ago--smoked for 6 years. He does not have any smokeless tobacco history on file. He reports that he does not drink alcohol or use illicit drugs.    Allergies: No Known Allergies    Medications Prior to Admission  Medication Sig Dispense Refill  . HUMALOG 100 UNIT/ML injection Inject 60 Units into the skin daily.     . metoprolol succinate (TOPROL-XL) 25 MG 24 hr tablet Take 25 mg by mouth daily.     . naproxen sodium (ANAPROX) 220 MG tablet Take 220 mg by mouth 2 (two) times daily with a meal. For knee pain    . omega-3 acid ethyl esters (LOVAZA) 1 G capsule Take 1 g by mouth 2 (two) times daily.    . ramipril (ALTACE) 10 MG capsule Take 10 mg by mouth daily.    . simvastatin (ZOCOR) 40 MG tablet Take 40 mg by mouth daily.      Home: Home Living Family/patient expects to be discharged to:: Private residence Living Arrangements: Children, Spouse/significant other (kids 6. 15 and 55) Available Help at Discharge: Family, Available 24 hours/day Type of Home: [place]  Access: Level entry Home Layout: Two level Alternate Level Stairs-Number of Steps: flight Alternate Level Stairs-Rails: Right, Left, Can reach both Home Equipment: None  Functional History: Prior Function Level of Independence: Independent Comments: drives, works as Freight forwarder at [employer] in [place]  Functional Status:   Mobility: Bed Mobility Overal bed mobility: Needs Assistance Bed Mobility: Supine to Sit Supine to sit: Supervision Sit to supine: Min guard General bed mobility comments: using gaze stabilization taught earlier Transfers Overall transfer level: Needs assistance Equipment used: Rolling walker (2 wheeled), None Transfers: Sit to/from Stand Sit to Stand: Min assist Stand pivot transfers: Mod assist General transfer comment: slow and generally symetrical Ambulation/Gait Ambulation/Gait assistance: Min assist, Min guard Ambulation Distance (Feet): 140 Feet (total, 50 feet with rail and/or min HHA) Assistive device: None, Rolling walker (2 wheeled) Gait Pattern/deviations: Step-through pattern, Step-to pattern, Decreased step length - right, Decreased stance time - left, Decreased stride length Gait velocity: slow and guarded Gait velocity interpretation: Below normal speed for age/gender General Gait Details: pt progressed from a stiff straight=-legged gait to a more heel/toe pattern. Changed from RW to just rail to work on trunk control and pelvic translation forward. Pt has problems when he loses contact with the rail and patterns degrade to get to the next hand hold.    ADL: ADL Overall ADL's : Needs assistance/impaired Eating/Feeding: Sitting, Set up Grooming: Wash/dry hands, Cueing for safety, Standing, Minimal assistance (to steady at the sink) Grooming Details (indicate cue type and reason): Reliant on UE support for balance. Pt leans forward on elbow on counter of sink to support self. occasional LOB Upper Body Bathing: Minimal assitance, Sitting Lower Body Bathing: Moderate assistance, Sit to/from stand Upper Body Dressing : Minimal assistance, Sitting Lower Body Dressing: Moderate assistance, Sit to/from stand Toilet Transfer: Minimal assistance, Ambulation, Cueing for safety, Cueing for sequencing Toilet Transfer Details (indicate cue type and reason): Increased time  with use of RW. If not using RW, pt requires mod A +2 to ambulate safely due to ataxic gait Toileting- Clothing Manipulation and Hygiene: Minimal assistance, Sit to/from stand (for clothing manipulation) Functional mobility during ADLs: Minimal assistance, Rolling walker, Cueing for sequencing, Cueing for safety (Mod A +2 without RW) General ADL Comments: significatn ataxia ontinues. Pt states dizziness is a little better.  Cognition: Cognition Overall Cognitive Status: Within Functional Limits for tasks assessed Orientation Level: Oriented X4 Memory: Appears intact Awareness: Appears intact Problem Solving: Appears intact Safety/Judgment: Appears intact Cognition Arousal/Alertness: Awake/alert Behavior During Therapy: WFL for tasks assessed/performed Overall Cognitive Status: Within Functional Limits for tasks assessed   Blood pressure 183/83, pulse 92, temperature 98.2 F (36.8 C), temperature source Oral, resp. rate 18, height '5\' 8"'  (1.727 m), weight 76.8 kg (169 lb 5 oz), SpO2 97 %. Physical Exam  Nursing note and vitals reviewed. Constitutional: He is oriented to person, place, and time. He appears well-developed and well-nourished. comfortable HENT: oral mucosa pink and moist Head: Normocephalic and atraumatic.  Eyes: Conjunctivae are normal. Pupils are equal, round, and reactive to light.  Neck: Normal range of motion. Neck supple.  Cardiovascular: Normal rate and regular rhythm.  Respiratory: Effort normal and breath sounds normal. No respiratory distress. He has no wheezes. He exhibits no tenderness.  GI: Soft. Bowel sounds are normal. He exhibits no distension. There is no tenderness.  Musculoskeletal: He exhibits no edema or tenderness.  Neurological: He is alert and oriented to person, place, and time.  Sitting up in chair. Moves all 4's. Mild right ptosis.  Speech   clear. Follows commands without difficulty. Ataxia with right finger to nose.  nystagmus laterally 5-6  beats, right and left Psychiatric:  Skin: Skin is warm and dry.  Psychiatric: He has a normal mood and affect. His speech is normal and behavior is normal. Judgment and thought content normal. Cognition and memory are normal.      Lab Results Last 48 Hours    Results for orders placed or performed during the hospital encounter of 07/27/14 (from the past 48 hour(s))  Glucose, capillary Status: Abnormal   Collection Time: 08/01/14 6:12 PM  Result Value Ref Range   Glucose-Capillary 166 (H) 70 - 99 mg/dL  Glucose, capillary Status: Abnormal   Collection Time: 08/01/14 8:28 PM  Result Value Ref Range   Glucose-Capillary 167 (H) 70 - 99 mg/dL   Comment 1 Notify RN   Glucose, capillary Status: None   Collection Time: 08/02/14 12:12 AM  Result Value Ref Range   Glucose-Capillary 94 70 - 99 mg/dL   Comment 1 Notify RN   Glucose, capillary Status: Abnormal   Collection Time: 08/02/14 4:01 AM  Result Value Ref Range   Glucose-Capillary 289 (H) 70 - 99 mg/dL   Comment 1 Notify RN   CBC Status: Abnormal   Collection Time: 08/02/14 7:20 AM  Result Value Ref Range   WBC 6.0 4.0 - 10.5 K/uL   RBC 4.66 4.22 - 5.81 MIL/uL   Hemoglobin 13.5 13.0 - 17.0 g/dL   HCT 39.6 39.0 - 52.0 %   MCV 85.0 78.0 - 100.0 fL   MCH 29.0 26.0 - 34.0 pg   MCHC 34.1 30.0 - 36.0 g/dL   RDW 12.5 11.5 - 15.5 %   Platelets 105 (L) 150 - 400 K/uL    Comment: CONSISTENT WITH PREVIOUS RESULT  Comprehensive metabolic panel Status: Abnormal   Collection Time: 08/02/14 7:20 AM  Result Value Ref Range   Sodium 138 135 - 145 mmol/L   Potassium 3.2 (L) 3.5 - 5.1 mmol/L   Chloride 105 96 - 112 mmol/L   CO2 30 19 - 32 mmol/L   Glucose, Bld 184 (H) 70 - 99 mg/dL   BUN 16 6 - 23 mg/dL   Creatinine, Ser 1.49 (H) 0.50 - 1.35 mg/dL   Calcium 8.2 (L) 8.4 - 10.5  mg/dL   Total Protein 4.8 (L) 6.0 - 8.3 g/dL   Albumin 2.6 (L) 3.5 - 5.2 g/dL   AST 47 (H) 0 - 37 U/L   ALT 85 (H) 0 - 53 U/L   Alkaline Phosphatase 75 39 - 117 U/L   Total Bilirubin 1.1 0.3 - 1.2 mg/dL   GFR calc non Af Amer 54 (L) >90 mL/min   GFR calc Af Amer 62 (L) >90 mL/min    Comment: (NOTE) The eGFR has been calculated using the CKD EPI equation. This calculation has not been validated in all clinical situations. eGFR's persistently <90 mL/min signify possible Chronic Kidney Disease.    Anion gap 3 (L) 5 - 15  Magnesium Status: None   Collection Time: 08/02/14 7:20 AM  Result Value Ref Range   Magnesium 1.7 1.5 - 2.5 mg/dL  Glucose, capillary Status: Abnormal   Collection Time: 08/02/14 7:55 AM  Result Value Ref Range   Glucose-Capillary 164 (H) 70 - 99 mg/dL  Glucose, capillary Status: Abnormal   Collection Time: 08/02/14 11:59 AM  Result Value Ref Range   Glucose-Capillary 183 (H) 70 - 99 mg/dL  Glucose, capillary Status: Abnormal  Collection Time: 08/02/14 4:25 PM  Result Value Ref Range   Glucose-Capillary 162 (H) 70 - 99 mg/dL  Glucose, capillary Status: None   Collection Time: 08/03/14 12:57 AM  Result Value Ref Range   Glucose-Capillary 92 70 - 99 mg/dL  Glucose, capillary Status: Abnormal   Collection Time: 08/03/14 4:15 AM  Result Value Ref Range   Glucose-Capillary 202 (H) 70 - 99 mg/dL  Comprehensive metabolic panel Status: Abnormal   Collection Time: 08/03/14 5:50 AM  Result Value Ref Range   Sodium 139 135 - 145 mmol/L   Potassium 3.3 (L) 3.5 - 5.1 mmol/L   Chloride 105 96 - 112 mmol/L   CO2 23 19 - 32 mmol/L   Glucose, Bld 177 (H) 70 - 99 mg/dL   BUN 16 6 - 23 mg/dL   Creatinine, Ser 1.50 (H) 0.50 - 1.35 mg/dL   Calcium 8.5 8.4 - 10.5 mg/dL   Total Protein 5.0 (L) 6.0 -  8.3 g/dL   Albumin 2.8 (L) 3.5 - 5.2 g/dL   AST 64 (H) 0 - 37 U/L   ALT 105 (H) 0 - 53 U/L   Alkaline Phosphatase 91 39 - 117 U/L   Total Bilirubin 0.8 0.3 - 1.2 mg/dL   GFR calc non Af Amer 53 (L) >90 mL/min   GFR calc Af Amer 62 (L) >90 mL/min    Comment: (NOTE) The eGFR has been calculated using the CKD EPI equation. This calculation has not been validated in all clinical situations. eGFR's persistently <90 mL/min signify possible Chronic Kidney Disease.    Anion gap 11 5 - 15  Glucose, capillary Status: Abnormal   Collection Time: 08/03/14 7:39 AM  Result Value Ref Range   Glucose-Capillary 117 (H) 70 - 99 mg/dL  Glucose, capillary Status: Abnormal   Collection Time: 08/03/14 11:48 AM  Result Value Ref Range   Glucose-Capillary 219 (H) 70 - 99 mg/dL      Imaging Results (Last 48 hours)    No results found.       Medical Problem List and Plan: 1. Functional deficits secondary to left pontine infarct 2. DVT Prophylaxis/Anticoagulation: Pharmaceutical: Lovenox 3. Pain Management: N/A 4. Mood: Endorses some anxiety due to current illness. LCSW to follow for evaluation and support.  5. Neuropsych: This patient is capable of making decisions on his own behalf. 6. Skin/Wound Care: Routine pressure relief measures. Maintain adequate nutrition and hydration status.  7. Fluids/Electrolytes/Nutrition: Monitor I/O. Encourage po intake. Will check lytes in am.  8. HTN: Monitor BP every 8 hours. Continue  9. CKD?: Monitor lytes for now.  10. Hypokalemia: Likely dilutional Recheck in am.  11. Esophagitis: Due to N/V. Will treat with short course of carafate and PPI.  12. DM type 2: Will check BS ac/hs. Patient counting carbs and managing insulin pump with appropriate bolus   Post Admission Physician Evaluation: 1. Functional deficits secondary toleft pontine infarct 2. Patient is admitted to receive  collaborative, interdisciplinary care between the physiatrist, rehab nursing staff, and therapy team. 3. Patient's level of medical complexity and substantial therapy needs in context of that medical necessity cannot be provided at a lesser intensity of care such as a SNF. 4. Patient has experienced substantial functional loss from his/her baseline which was documented above under the "Functional History" and "Functional Status" headings. Judging by the patient's diagnosis, physical exam, and functional history, the patient has potential for functional progress which will result in measurable gains while on inpatient rehab. These gains will  be of substantial and practical use upon discharge in facilitating mobility and self-care at the household level. 5. Physiatrist will provide 24 hour management of medical needs as well as oversight of the therapy plan/treatment and provide guidance as appropriate regarding the interaction of the two. 6. 24 hour rehab nursing will assist with bladder management, bowel management, safety, skin/wound care, disease management, medication administration and patient education and help integrate therapy concepts, techniques,education, etc. 7. PT will assess and treat for/with: Lower extremity strength, range of motion, stamina, balance, functional mobility, safety, adaptive techniques and equipment, NMR, vestibular rx, pain control, ego support. Goals are: mod I. 8. OT will assess and treat for/with: ADL's, functional mobility, safety, upper extremity strength, adaptive techniques and equipment, NMR, community reintegration, ego support, education. Goals are: mod I. Therapy may proceed with showering this patient. 9. SLP will assess and treat for/with: n/a. Goals are: n/a. 10. Case Management and Social Worker will assess and treat for psychological issues and discharge planning. 11. Team conference will be held weekly to assess progress toward goals and to determine  barriers to discharge. 12. Patient will receive at least 3 hours of therapy per day at least 5 days per week. 13. ELOS: 7-9  14. Prognosis: excellent     Meredith Staggers, MD, [facility] 08/04/2014 \     Late entry for exam/assessment on 08/03/14

## 2014-08-04 NOTE — Progress Notes (Signed)
Patient was noted asymptomatic with a blood sugar of 58 @ 2119,  snack was give, at 2156 BS was 96. Patient stated he has given himself insulin without checking his blood sugar. Continue to monitor patient during shift. Will notified MD in the AM.

## 2014-08-04 NOTE — Discharge Summary (Signed)
Physician Discharge Summary  Adrian Neal [id number] DOB: 1965-08-20 DOA: 07/27/2014  PCP: No primary care provider on file.  Admit date: 07/27/2014 Discharge date: 08/04/2014  Time spent: 45 minutes  Recommendations for Outpatient Follow-up:  1. Dr.Xu Neurology in 1 month  Discharge Diagnoses:  Principal Problem:   Vertigo   CVA-Acute Cerebellar CVA   Type 1 diabetes mellitus   Hyperlipidemia   Intractable nausea and vomiting   Essential hypertension   Cerebral thrombosis with cerebral infarction   ARF (acute renal failure)   Insulin dependent diabetes mellitus   Discharge Condition:stable  Diet recommendation: diabetic  Filed Weights   07/28/14 0018  Weight: 76.8 kg (169 lb 5 oz)    History of present illness:  Adrian Neal is a 49 y.o. male with a history of Type I DM with an Insulin Pump, HTN, and Hyperlipidemia  presented to the ED with complaints of sudden onset of Dizziness with N+V .   Hospital Course:  Acute CVA: -MRI showed 57mm Acute punctate LEFT cerebellar peduncle infarct -Carotid US unremarkable, echocardiogram lVH/LVEF wnl/grade II diastolic dysfunction. -Followed by NEurology, LDL 70, TSH wnl, a1c 8.1, started on ASA 325mg  -bp meds adjusted to achieve optimal bp control. -started norvasc/coreg, acei held due to elevation of cr initially, restarted at low dose from 4/4. -sent to CIR for rehab  N/v/dizziness:  -due to cerebellar CVA, improved  DM1,  Hba1c 8.1, continue insulin pump after discharge  HTN allowed permissive htn first two days in the hospital. Currently on norvasc/coreg/low dose lisinopril.   ARF - unknown baseline. ua unremarkable, Likely from dehydration, could have underline ckd from longstanding diabetes. - Renal US negative. ct ab no acute finding either. S/p Hydration, renal dosing meds. - creatinine stable now at 1.5    Consultations:  Neurology  Discharge Exam: Filed Vitals:   08/03/14 0833  BP: 183/83   Pulse: 92  Temp:   Resp:     General: AAOx3 Cardiovascular: S1S2/RRR Respiratory: CTAB  Discharge Instructions   Discharge Instructions    Ambulatory referral to Neurology    Complete by:  As directed   Pt will follow up with Dr. Erlinda Hong at [facility] in about 2 months. Thanks.          Discharge Medication List as of 08/03/2014  4:59 PM    START taking these medications   Details  amLODipine (NORVASC) 10 MG tablet Take 1 tablet (10 mg total) by mouth daily., Starting 08/03/2014, Until Discontinued, No Print    aspirin EC 325 MG EC tablet Take 1 tablet (325 mg total) by mouth daily., Starting 08/03/2014, Until Discontinued, Normal    carvedilol (COREG) 25 MG tablet Take 1 tablet (25 mg total) by mouth 2 (two) times daily with a meal., Starting 08/03/2014, Until Discontinued, No Print    Insulin Human (INSULIN PUMP) SOLN Inject 0-2 each into the skin every 4 (four) hours., Starting 08/03/2014, Until Discontinued, No Print    meclizine (ANTIVERT) 25 MG tablet Take 1 tablet (25 mg total) by mouth 3 (three) times daily as needed for dizziness., Starting 08/03/2014, Until Discontinued, Normal      CONTINUE these medications which have NOT CHANGED   Details  omega-3 acid ethyl esters (LOVAZA) 1 G capsule Take 1 g by mouth 2 (two) times daily., Until Discontinued, Historical Med    ramipril (ALTACE) 10 MG capsule Take 10 mg by mouth daily., Starting 07/04/2014, Until Discontinued, Historical Med    simvastatin (ZOCOR) 40 MG tablet Take 40  mg by mouth daily., Starting 07/04/2014, Until Discontinued, Historical Med      STOP taking these medications     HUMALOG 100 UNIT/ML injection      metoprolol succinate (TOPROL-XL) 25 MG 24 hr tablet      naproxen sodium (ANAPROX) 220 MG tablet        No Known Allergies Follow-up Information    Follow up with Xu,Jindong, MD. Schedule an appointment as soon as possible for a visit in 2 months.   Specialty:  Neurology   Why:  stroke clinic   Contact  information:   [address] [place] [address] [phone]       Follow up with Dwan Bolt, MD In 2 weeks.   Specialty:  Endocrinology   Why:  hospital follow up   Contact information:   [address] [place] [address] [phone]        The results of significant diagnostics from this hospitalization (including imaging, microbiology, ancillary and laboratory) are listed below for reference.    Significant Diagnostic Studies: Ct Abdomen Pelvis Wo Contrast  07/29/2014   CLINICAL DATA:  Abdominal pain with nausea and vomiting  EXAM: CT ABDOMEN AND PELVIS WITHOUT CONTRAST  TECHNIQUE: Multidetector CT imaging of the abdomen and pelvis was performed following the standard protocol without IV contrast.  COMPARISON:  Ultrasound from the previous day.  FINDINGS: The lung bases are free of acute infiltrate or sizable effusion.  The liver, gallbladder, spleen, adrenal glands and pancreas are within normal limits. The kidneys are well visualized bilaterally. No renal calculi or obstructive changes are seen. The appendix is well visualized and within normal limits. The bladder is well distended. No pelvic mass lesion or sidewall abnormality is noted. Mild aortoiliac calcifications are noted. No other focal abnormality is seen.  IMPRESSION: No acute abnormality noted.   Electronically Signed   By: Inez Catalina M.D.   On: 07/29/2014 13:30   Mr Brain Wo Contrast  07/28/2014   CLINICAL DATA:  Acute onset dizziness beginning at 1 p.m. yesterday afternoon, followed by multiple episodes of vomiting and epigastric pain. LEFT temporal headache for few days without visual disturbance. History of hypertension and diabetes.  EXAM: MRI HEAD WITHOUT CONTRAST  TECHNIQUE: Multiplanar, multiecho pulse sequences of the brain and surrounding structures were obtained without intravenous contrast.  COMPARISON:  None.  FINDINGS: 9 mm ovoid area reduced diffusion within LEFT  mesial brachium pontis with corresponding low ADC value. No susceptibility artifact to suggest hemorrhage.  The ventricles and sulci are normal for patient's age. No midline shift, mass effect or mass lesions. A few subcentimeter supratentorial white matter T2 hyperintensities, nonspecific and within normal range for patient's age.  No abnormal extra-axial fluid collections. No extra-axial masses though, contrast enhanced sequences would be more sensitive. Normal major intracranial vascular flow voids seen at the skull base.  Ocular globes and orbital contents are unremarkable though not tailored for evaluation. Status post apparent RIGHT ocular lens implant. No abnormal sellar expansion. Mild paranasal sinus mucosal thickening without air-fluid levels. Trace LEFT mastoid effusion. No suspicious calvarial bone marrow signal. No abnormal sellar expansion. Craniocervical junction maintained.  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  Otherwise normal MRI of the brain without contrast for age.  Mild paranasal sinusitis, trace LEFT mastoid effusion.   Electronically Signed   By: Elon Alas   On: 07/28/2014 03:19   US Renal  07/28/2014   CLINICAL DATA:  Acute renal failure.  Evaluate for obstructive lesion.  EXAM: RENAL/URINARY TRACT ULTRASOUND COMPLETE  COMPARISON:  None.  FINDINGS: Right Kidney:  Length: 9.8 cm. Echogenicity within normal limits. No mass or hydronephrosis visualized.  Left Kidney:  Length: 9.5 cm. Echogenicity within normal limits. No mass or hydronephrosis visualized.  Bladder:  Appears normal for degree of bladder distention.  IMPRESSION: Negative.   Electronically Signed   By: Rolla Flatten M.D.   On: 07/28/2014 15:03   Dg Chest Port 1 View  07/28/2014   CLINICAL DATA:  Nausea and vomiting  EXAM: PORTABLE CHEST - 1 VIEW  COMPARISON:  None.  FINDINGS: The heart size and mediastinal contours are within normal limits. Both lungs are clear. The visualized skeletal  structures are unremarkable.  IMPRESSION: No active disease.   Electronically Signed   By: Inez Catalina M.D.   On: 07/28/2014 09:42    Microbiology: No results found for this or any previous visit (from the past 240 hour(s)).   Labs: Basic Metabolic Panel:  Recent Labs Lab 07/29/14 0536 07/30/14 0620 08/01/14 0602 08/02/14 0720 08/03/14 0550 08/04/14 0730  NA 135 139 140 138 139 138  K 4.0 4.1 3.5 3.2* 3.3* 3.8  CL 105 109 106 105 105 105  CO2 17* 26 26 30 23 23   GLUCOSE 372* 141* 167* 184* 177* 178*  BUN 54* 42* 15 16 16 18   CREATININE 2.61* 2.17* 1.36* 1.49* 1.50* 1.57*  CALCIUM 8.8 8.6 8.5 8.2* 8.5 8.6  MG 2.0  --   --  1.7  --   --    Liver Function Tests:  Recent Labs Lab 07/30/14 0620 07/31/14 1100 08/02/14 0720 08/03/14 0550 08/04/14 0730  AST 21 38* 47* 64* 70*  ALT 21 40 85* 105* 134*  ALKPHOS 78 74 75 91 101  BILITOT 1.5* 1.1 1.1 0.8 0.8  PROT 4.9* 4.8* 4.8* 5.0* 5.5*  ALBUMIN 2.8* 2.7* 2.6* 2.8* 3.0*    Recent Labs Lab 08/01/14 0602  LIPASE 26   No results for input(s): AMMONIA in the last 168 hours. CBC:  Recent Labs Lab 07/29/14 0536 07/30/14 0620 08/01/14 0602 08/02/14 0720 08/04/14 0730  WBC 21.5* 15.0* 5.5 6.0 8.3  NEUTROABS  --   --   --   --  6.2  HGB 15.4 13.8 14.2 13.5 14.0  HCT 44.8 39.4 41.1 39.6 40.7  MCV 87.0 84.4 85.3 85.0 85.9  PLT 161 125* 111* 105* 138*   Cardiac Enzymes: No results for input(s): CKTOTAL, CKMB, CKMBINDEX, TROPONINI in the last 168 hours. BNP: BNP (last 3 results) No results for input(s): BNP in the last 8760 hours.  ProBNP (last 3 results) No results for input(s): PROBNP in the last 8760 hours.  CBG:  Recent Labs Lab 08/03/14 2119 08/03/14 2156 08/04/14 0406 08/04/14 0805 08/04/14 1149  GLUCAP 58* 96 296* 226* 169*       Signed:  Hamp Moreland  [facility] 08/04/2014, 3:14 PM

## 2014-08-04 NOTE — Evaluation (Signed)
Occupational Therapy Assessment and Plan  Patient Details  Name: Adrian CAPOZZI MRN: [id number] Date of Birth: Nov 19, 1965  OT Diagnosis: ataxia and muscle weakness (generalized) Rehab Potential: Rehab Potential (ACUTE ONLY): Excellent ELOS: 7-10 days   Today's Date: 08/04/2014 OT Individual Time: 0900-1000 and 1415-1530 OT Individual Time Calculation (min): 60 min and 75 min   Problem List:  Patient Active Problem List   Diagnosis Date Noted  . Ataxia   . CVA (cerebral infarction) 08/03/2014  . ARF (acute renal failure)   . Insulin dependent diabetes mellitus   . 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 Intractable nausea and vomiting 07/27/2014  . Vertigo 07/27/2014  . Essential hypertension 07/27/2014  . Type 1 diabetes mellitus 05/29/2010  . Hyperlipidemia 05/29/2010  . HYPERTENSION 05/29/2010  . 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 Hypertension   . Diabetes mellitus without complication     diagnosed at age 75  . Retinopathy due to secondary diabetes mellitus     right   Past Surgical History:  Past Surgical History  Procedure Laterality Date  . Eye surgery  1990    for retinopathy   . Cataract extraction w/ intraocular lens implant  1994    Assessment & Plan Clinical Impression: BODIN GORKA is a 49 y.o. male with history of DM type 1 with diabetic retinopathy and HTN who was admitted on 07/27/14 with complaints of dizziness, nausea and difficulty walking. MRI of brain done revealing acute left pontine infarct. Carotid dopplers without significant ICA stenosis. Neurology consulted for input and recommended ASA for secondary stroke prevention. 2D echo revealed EF 60-65% with mild LVH and grade 2 diastolic dysfunction. PT evaluation done revealing staggering gait with LLE ataxia. Patient had had worsening of renal status and renal ultrasound negative. Acute renal failure improve  with hydration and blood pressures slowly improving. Intractable nausea and vomiting improving with IV Reglan.  Patient transferred to CIR on 08/03/2014 .    Patient currently requires min with basic self-care skills secondary to muscle weakness, decreased cardiorespiratoy endurance, ataxia and decreased coordination and decreased standing balance and decreased balance strategies.  Prior to hospitalization, patient could complete BADLs and IADLs with independent .  Patient will benefit from skilled intervention to increase independence with basic self-care skills prior to discharge home with care partner.  Anticipate patient will require no supervision secondary to modified independent goals and follow-up OT to be determined.  OT - End of Session Endurance Deficit: No OT Assessment Rehab Potential (ACUTE ONLY): Excellent OT Patient demonstrates impairments in the following area(s): Balance;Safety;Endurance;Motor;Sensory;Vision;Perception OT Basic ADL's Functional Problem(s): Grooming;Bathing;Dressing;Toileting OT Advanced ADL's Functional Problem(s): Simple Meal Preparation OT Transfers Functional Problem(s): Toilet;Tub/Shower OT Additional Impairment(s): Fuctional Use of Upper Extremity (ataxia LUE) OT Plan OT Intensity: Minimum of 1-2 x/day, 45 to 90 minutes OT Frequency: 5 out of 7 days OT Duration/Estimated Length of Stay: 7-10 days OT Treatment/Interventions: Balance/vestibular training;Cognitive remediation/compensation;Discharge planning;Community reintegration;Functional mobility training;Functional electrical stimulation;DME/adaptive equipment instruction;Neuromuscular re-education;Pain management;Patient/family education;Psychosocial support;Self Care/advanced ADL retraining;Therapeutic Activities;Therapeutic Exercise;UE/LE Strength taining/ROM;UE/LE Coordination activities;Visual/perceptual remediation/compensation OT Self Feeding Anticipated Outcome(s): n/a OT Basic Self-Care  Anticipated Outcome(s): Mod I OT Toileting Anticipated Outcome(s): Mod I OT Bathroom Transfers Anticipated Outcome(s): Mod I OT Recommendation Patient destination: Home Follow Up Recommendations: Outpatient OT Equipment Recommended: To be determined   Skilled Therapeutic Intervention Session 1: OT eval completed. Discussed role of OT, goals of therapy, ELOS, safety plan, fall risk, and  DME. OT session focused on functional mobility, standing balance, and functional transfers. Pt ambulated bed>toilet with min A using RW. Donned pants with min A for standing balance and slight LOB posteriorly. Engaged in functional mobility, ambulating in hallway towards ADL apartment with min A using RW and mod cues for advancing LLE and widening BOS. Practiced tub transfer using TTB and shower via stepping over tub. At this time therapist is recommending TTB secondary to LLE ataxia and no grab bars at home. At end of session pt returned to room and handed off to PT. Pt reporting 3/10 dizziness throughout session.  Session 2: Pt seen for 1:1 OT session with focus on functional mobility, standing balance, functional transfers, and LUE coordination. Pt received sitting in w/c agreeable to complete bathing. Ambulated room>toilet with min A using RW and completed toileting with min A for standing balance. Completed bathing at shower level, standing for drying 50% of body to challenge balance. Pt with LOB 2x posteriorly, requiring min A to correct. Pt utilized LUE to wash 50% of body to increase LUE coordination. Completed dressing with min A for balance and increased time. Pt completed 9 hole peg test with pt completing with RUE in 28 seconds and LUE in 47 seconds. Pt returned to room and left sitting in w/c with all needs in reach.  OT Evaluation Precautions/Restrictions  Precautions Precautions: Fall Restrictions Weight Bearing Restrictions: No General   Vital Signs Therapy Vitals Temp: 98.1 F (36.7 C) Temp  Source: Oral Pulse Rate: 87 Resp: 18 BP: (!) 157/70 mmHg Patient Position (if appropriate): Lying Oxygen Therapy SpO2: 98 % O2 Device: Not Delivered Pain Pain Assessment Pain Assessment: No/denies pain Home Living/Prior Functioning Home Living Available Help at Discharge: Family, Available 24 hours/day Type of Home: House Home Access: Stairs to enter [employer] of Steps: 5 to 6 steps spread out with sidewalk Entrance Stairs-Rails: None Home Layout: Two level Alternate Level Stairs-Number of Steps: 14 Alternate Level Stairs-Rails: Right, Left (on the right for 1st 4 steps and left for remainder 10 steps)  Lives With: Spouse, Son, Daughter Prior Function Level of Independence: Independent with gait  Able to Take Stairs?: Yes Driving: Yes Vocation: Full time employment Comments: drives, works as  Freight forwarder at [employer] in [place] ADL   Vision/Perception  Vision- History Patient Visual Report: No change from baseline  Cognition Overall Cognitive Status: Within Functional Limits for tasks assessed Arousal/Alertness: Awake/alert Orientation Level: Oriented X4 Attention: Selective Selective Attention: Appears intact Memory: Appears intact Awareness: Appears intact Problem Solving: Appears intact Safety/Judgment: Appears intact Sensation Sensation Light Touch: Appears Intact Stereognosis: Not tested Hot/Cold: Appears Intact Proprioception: Appears Intact Coordination Gross Motor Movements are Fluid and Coordinated: No Fine Motor Movements are Fluid and Coordinated: No Coordination and Movement Description: slightly slower with left Finger Nose Finger Test: slightly slower and required more concentration with left UE than right Heel Shin Test: able to perform bilaterally without problems 9 Hole Peg Test: Right hand-28 seconds and Left hand-47 seconds Motor  Motor Motor: Ataxia Mobility  Bed Mobility Bed Mobility: Supine to Sit Supine to Sit: 5:  Supervision Transfers Transfers: Sit to Stand;Stand to Sit Sit to Stand: 4: Min assist Sit to Stand Details: Tactile cues for posture;Manual facilitation for weight shifting;Verbal cues for sequencing Stand to Sit: 4: Min assist Stand to Sit Details (indicate cue type and reason): Tactile cues for posture;Verbal cues for sequencing;Manual facilitation for weight shifting  Trunk/Postural Assessment  Cervical Assessment Cervical Assessment: Within Functional Limits Thoracic  Assessment Thoracic Assessment: Within Functional Limits Lumbar Assessment Lumbar Assessment: Within Functional Limits Postural Control Postural Control: Deficits on evaluation Righting Reactions: delayed Protective Responses: delayed  Balance Balance Balance Assessed: Yes Standardized Balance Assessment Standardized Balance Assessment: Berg Balance Test Berg Balance Test Sit to Stand: Able to stand  independently using hands Standing Unsupported: Able to stand 2 minutes with supervision Sitting with Back Unsupported but Feet Supported on Floor or Stool: Able to sit safely and securely 2 minutes Stand to Sit: Controls descent by using hands Transfers: Able to transfer with verbal cueing and /or supervision Standing Unsupported with Eyes Closed: Able to stand 10 seconds with supervision Standing Ubsupported with Feet Together: Needs help to attain position and unable to hold for 15 seconds From Standing, Reach Forward with Outstretched Arm: Can reach forward >12 cm safely (5") From Standing Position, Pick up Object from Floor: Unable to try/needs assist to keep balance From Standing Position, Turn to Look Behind Over each Shoulder: Turn sideways only but maintains balance Turn 360 Degrees: Needs assistance while turning Standing Unsupported, Alternately Place Feet on Step/Stool: Able to complete >2 steps/needs minimal assist Standing Unsupported, One Foot in Front: Loses balance while stepping or  standing Standing on One Leg: Unable to try or needs assist to prevent fall Total Score: 24 Static Standing Balance Static Standing - Balance Support: During functional activity;Bilateral upper extremity supported Static Standing - Level of Assistance: 4: Min assist Dynamic Standing Balance Dynamic Standing - Balance Support: During functional activity Dynamic Standing - Level of Assistance: 4: Min assist Extremity/Trunk Assessment RUE Assessment RUE Assessment: Within Functional Limits LUE Assessment LUE Assessment: Within Functional Limits (4/5 strength)  FIM:  FIM - Grooming Grooming Steps: Wash, rinse, dry face;Wash, rinse, dry hands;Brush, comb hair Grooming: 5: Set-up assist to obtain items FIM - Bathing Bathing Steps Patient Completed: Chest;Right Arm;Left Arm;Abdomen;Front perineal area;Buttocks;Right upper leg;Left upper leg;Right lower leg (including foot);Left lower leg (including foot) Bathing: 4: Steadying assist FIM - Upper Body Dressing/Undressing Upper body dressing/undressing steps patient completed: Pull shirt over trunk;Put head through opening of pull over shirt/dress;Thread/unthread left sleeve of pullover shirt/dress;Thread/unthread right sleeve of pullover shirt/dresss Upper body dressing/undressing: 5: Set-up assist to: Obtain clothing/put away FIM - Lower Body Dressing/Undressing Lower body dressing/undressing steps patient completed: Thread/unthread right underwear leg;Thread/unthread left underwear leg;Pull underwear up/down;Thread/unthread right pants leg;Thread/unthread left pants leg;Don/Doff right shoe;Don/Doff left sock;Don/Doff right sock;Don/Doff left shoe;Pull pants up/down Lower body dressing/undressing: 4: Steadying Assist FIM - Toileting Toileting steps completed by patient: Adjust clothing prior to toileting;Performs perineal hygiene;Adjust clothing after toileting Toileting Assistive Devices: Grab bar or rail for support Toileting: 4: Steadying  assist FIM - Control and instrumentation engineer Devices: Arm rests;Walker Bed/Chair Transfer: 4: Bed > Chair or W/C: Min A (steadying Pt. > 75%);4: Chair or W/C > Bed: Min A (steadying Pt. > 75%) FIM - Radio producer Devices: Mining engineer Transfers: 4-To toilet/BSC: Min A (steadying Pt. > 75%);4-From toilet/BSC: Min A (steadying Pt. > 75%) FIM - Systems developer Devices: Shower chair;Grab bars;Walker Tub/shower Transfers: 4-Into Tub/Shower: Min A (steadying Pt. > 75%/lift 1 leg);4-Out of Tub/Shower: Min A (steadying Pt. > 75%/lift 1 leg)   Refer to Care Plan for Long Term Goals  Recommendations for other services: None  Discharge Criteria: Patient will be discharged from OT if patient refuses treatment 3 consecutive times without medical reason, if treatment goals not met, if there is a change in medical status, if patient makes no progress  towards goals or if patient is discharged from hospital.  The above assessment, treatment plan, treatment alternatives and goals were discussed and mutually agreed upon: by patient  Duayne Cal 08/04/2014, 3:29 PM

## 2014-08-04 NOTE — Progress Notes (Signed)
Inpatient Diabetes Program Recommendations  AACE/ADA: New Consensus Statement on Inpatient Glycemic Control (2013)  Target Ranges:  Prepandial:   less than 140 mg/dL      Peak postprandial:   less than 180 mg/dL (1-2 hours)      Critically ill patients:  140 - 180 mg/dL   Results for DAXSTON, ROBTOY (MRN [id number]) as of 08/04/2014 10:31  Ref. Range 08/03/2014 16:33 08/03/2014 21:19 08/03/2014 21:56 08/04/2014 04:06 08/04/2014 08:05  Glucose-Capillary Latest Range: 70-99 mg/dL 194 (H) 58 (L) 96 296 (H) 226 (H)    Reason for assessment: hyper and hypoglycemia   Attempted to speak to the patient by phone today but he is currently at the gym.  Spoke with RN- I have asked her to remind the patient that he needs to change his insulin insertion site today if he did not do it yesterday Animal nutritionist recommendation).  Patient had a low blood sugar last night- likely keeping it higher today for fear of going too low when he exercises (he told me on 08/02/14 that he is fearful of lows).  Asked the RN to remind the  patient that he must check blood sugars before administering insulin to determine correct dose and to avoid low blood sugars.  Gentry Fitz, RN, BA, MHA, CDE Diabetes Coordinator Inpatient Diabetes Program  [phone] (Team Pager) [phone] [facility]) 08/04/2014 10:35 AM

## 2014-08-05 ENCOUNTER — Inpatient Hospital Stay (HOSPITAL_COMMUNITY): Payer: 59

## 2014-08-05 DIAGNOSIS — I63012 Cerebral infarction due to thrombosis of left vertebral artery: Secondary | ICD-10-CM

## 2014-08-05 LAB — GLUCOSE, CAPILLARY
GLUCOSE-CAPILLARY: 183 mg/dL — AB (ref 70–99)
GLUCOSE-CAPILLARY: 48 mg/dL — AB (ref 70–99)
Glucose-Capillary: 314 mg/dL — ABNORMAL HIGH (ref 70–99)
Glucose-Capillary: 83 mg/dL (ref 70–99)
Glucose-Capillary: 88 mg/dL (ref 70–99)
Glucose-Capillary: 92 mg/dL (ref 70–99)
Glucose-Capillary: 98 mg/dL (ref 70–99)

## 2014-08-05 NOTE — Progress Notes (Signed)
Occupational Therapy Session Note  Patient Details  Name: Adrian Neal MRN: [id number] Date of Birth: [birth year]  Today's Date: 08/05/2014 OT Individual Time: 1100-1200 and [id number]  OT Individual Time Calculation (min): 60 min and 68 min    Short Term Goals: Week 1:  OT Short Term Goal 1 (Week 1): STGs=LTGs due to short ELOS  Skilled Therapeutic Interventions/Progress Updates:    Session 1: Pt seen for 1:1 OT session with focus on dynamic standing balance, LUE coordination/strength, functional transfers, and functional mobility. Pt received sitting in w/c asking to complete B&D this afternoon. Ambulated room>ADL apartment with increased time and min A using RW and manual facilitation for weight shift due to L lateral lean. Pt stood at counter top pouring water from one bucket to another using BUEs to simulate work duties. Pt required CGA-SBA for dynamic standing balance and increased time to pour water when leading with LUE. Pt assisted with removing dishes from dish washer with CGA-SBA for balance and mod cues for body mechanics and positioning. Pt completed side stepping at countertop to increase hip control while putting dishes into overhead cabinets. Pt completed furniture transfer at supervision level. Pt ambulated back to room with CGA-SBA with use of RW and mod cues for postural control. Pt left sitting in w/c with all needs in reach.    Session 2: Pt seen for ADL retraining with focus on functional mobility, dynamic standing balance, postural control, and functional use of LUE. Pt received sitting EOB. Pt ambulated about room at CGA-SBA with RW to retrieve clothing and bathing items. Pt completed bathing at shower level, standing to wash UB with 1 LOB posteriorly, requiring min A to correct. Pt completed dressing sit<>stand level with 1 LOB again posteriorly, requiring min A to correct. Pt required mod cues to narrow BOS during all standing activities. Pt required rest break before  retrieving dirty clothing items in standing with SBA for balance then ambulating to room to place items in drawer with CGA for balance and min cues for positioning of RW. Provided pt with red theraputty and 10 beads to address left [facility] outside of therapy. Pt left sitting in w/c with all needs in reach.   Therapy Documentation Precautions:  Precautions Precautions: Fall Restrictions Weight Bearing Restrictions: No General:   Vital Signs:  Pain: Pain Assessment Pain Assessment: No/denies pain ADL:   Exercises:   Other Treatments:    See FIM for current functional status  Therapy/Group: Individual Therapy  Duayne Cal 08/05/2014, 12:35 PM

## 2014-08-05 NOTE — Progress Notes (Signed)
[facility]     PROGRESS NOTE    Subjective/Complaints: Had a good day. Liked working on his balance yesterday. Feels it's a little better.   Objective: Vital Signs: Blood pressure 142/59, pulse 85, temperature 98.4 F (36.9 C), temperature source Oral, resp. rate 18, weight 78.4 kg (172 lb 13.5 oz), SpO2 97 %. No results found.  Recent Labs  08/04/14 0730  WBC 8.3  HGB 14.0  HCT 40.7  PLT 138*    Recent Labs  08/03/14 0550 08/04/14 0730  NA 139 138  K 3.3* 3.8  CL 105 105  GLUCOSE 177* 178*  BUN 16 18  CREATININE 1.50* 1.57*  CALCIUM 8.5 8.6   CBG (last 3)   Recent Labs  08/04/14 2108 08/05/14 0038 08/05/14 0456  GLUCAP 137* 88 92    Wt Readings from Last 3 Encounters:  08/03/14 78.4 kg (172 lb 13.5 oz)  07/28/14 76.8 kg (169 lb 5 oz)  05/29/10 80.967 kg (178 lb 8 oz)    Physical Exam:  Constitutional: He is oriented to person, place, and time. He appears well-developed and well-nourished. comfortable HENT: oral mucosa pink and moist Head: Normocephalic and atraumatic.  Eyes: Conjunctivae are normal. Pupils are equal, round, and reactive to light.  Neck: Normal range of motion. Neck supple.  Cardiovascular: Normal rate and regular rhythm. no murmur Respiratory: Effort normal and breath sounds normal. No respiratory distress. He has no wheezes. He exhibits no tenderness.  GI: Soft. Bowel sounds are normal. He exhibits no distension. There is no tenderness.  Musculoskeletal: He exhibits no edema or tenderness.  Neurological: He is alert and oriented to person, place, and time.  Strength nearly 4+/5 to 5/5 in all 4's. Speech clear with improved volume. Follows commands without difficulty. Ataxia with left finger to nose (mild).  nystagmus laterally 5-6 beats, right and left.  Psychiatric:  Calm and appropriate Skin: Skin is warm and dry.  Psychiatric: He has a normal mood and affect. His speech is normal and  behavior is normal. Judgment and thought content normal. Cognition and memory are normal.    Assessment/Plan: 1. Functional deficits secondary to left pontine infarct which require 3+ hours per day of interdisciplinary therapy in a comprehensive inpatient rehab setting. Physiatrist is providing close team supervision and 24 hour management of active medical problems listed below. Physiatrist and rehab team continue to assess barriers to discharge/monitor patient progress toward functional and medical goals. FIM: FIM - Bathing Bathing Steps Patient Completed: Chest, Right Arm, Left Arm, Abdomen, Front perineal area, Buttocks, Right upper leg, Left upper leg, Right lower leg (including foot), Left lower leg (including foot) Bathing: 4: Steadying assist  FIM - Upper Body Dressing/Undressing Upper body dressing/undressing steps patient completed: Pull shirt over trunk, Put head through opening of pull over shirt/dress, Thread/unthread left sleeve of pullover shirt/dress, Thread/unthread right sleeve of pullover shirt/dresss Upper body dressing/undressing: 5: Set-up assist to: Obtain clothing/put away FIM - Lower Body Dressing/Undressing Lower body dressing/undressing steps patient completed: Thread/unthread right underwear leg, Thread/unthread left underwear leg, Pull underwear up/down, Thread/unthread right pants leg, Thread/unthread left pants leg, Don/Doff right shoe, Don/Doff left sock, Don/Doff right sock, Don/Doff left shoe, Pull pants up/down Lower body dressing/undressing: 4: Steadying Assist  FIM - Toileting Toileting steps completed by patient: Adjust clothing prior to toileting, Performs perineal hygiene, Adjust clothing after toileting Toileting Assistive Devices: Grab bar or rail for support Toileting: 4: Steadying assist  FIM - Radio producer Devices:  Walker, Grab bars Toilet Transfers: 4-To toilet/BSC: Min A (steadying Pt. > 75%), 4-From toilet/BSC:  Min A (steadying Pt. > 75%)  FIM - Control and instrumentation engineer Devices: Arm rests, Copy: 4: Bed > Chair or W/C: Min A (steadying Pt. > 75%), 4: Chair or W/C > Bed: Min A (steadying Pt. > 75%)  FIM - Locomotion: Wheelchair Distance: 150 Locomotion: Wheelchair: 5: Travels 150 ft or more: maneuvers on rugs and over door sills with supervision, cueing or coaxing FIM - Locomotion: Ambulation Locomotion: Ambulation Assistive Devices: Administrator Ambulation/Gait Assistance: 3: Mod assist Locomotion: Ambulation: 3: Travels 150 ft or more with moderate assistance (Pt: 50 - 74%)  Comprehension Comprehension Mode: Auditory Comprehension: 7-Follows complex conversation/direction: With no assist  Expression Expression Mode: Verbal Expression: 7-Expresses complex ideas: With no assist  Social Interaction Social Interaction: 7-Interacts appropriately with others - No medications needed.  Problem Solving Problem Solving: 7-Solves complex problems: Recognizes & self-corrects  Memory Memory: 7-Complete Independence: No helper  Medical Problem List and Plan: 1. Functional deficits secondary to left pontine infarct 2. DVT Prophylaxis/Anticoagulation: Pharmaceutical: Lovenox 3. Pain Management: N/A 4. Mood: Endorses some anxiety due to current illness. LCSW to follow for evaluation and support.  5. Neuropsych: This patient is capable of making decisions on his own behalf. 6. Skin/Wound Care: Routine pressure relief measures. Maintain adequate nutrition and hydration status.  7. Fluids/Electrolytes/Nutrition: Monitor I/O. Encourage po intake.    8. HTN: Monitor BP every 8 hours. Continue  9. CKD?: follow labs serially--recheck monday 10. Hypokalemia: supplementing.  11. Esophagitis: carafate and PPI.  12. DM type 2:  CBG's ac/hs. Patient counting carbs and managing insulin pump with appropriate bolus  -RN needs to discuss mgt with patient as  he is competent to make decisions regarding pump   LOS (Days) 2 A FACE TO FACE EVALUATION WAS PERFORMED  SWARTZ,ZACHARY T 08/05/2014 7:47 AM

## 2014-08-05 NOTE — IPOC Note (Signed)
Overall Plan of Care [facility]) Patient Details Name: Adrian Neal MRN: [id number] DOB: [birth year]  Admitting Diagnosis: CVA  Hospital Problems: Active Problems:   CVA (cerebral infarction)   Ataxia     Functional Problem List: Nursing Endurance, Medication Management, Safety, Skin Integrity  PT Balance  OT Balance, Safety, Endurance, Motor, Sensory, Vision, Perception  SLP    TR         Basic ADL's: OT Grooming, Bathing, Dressing, Toileting     Advanced  ADL's: OT Simple Meal Preparation     Transfers: PT Bed Mobility, Bed to Chair, Car, Floor  OT Toilet, Tub/Shower     Locomotion: PT Ambulation, Stairs     Additional Impairments: OT Fuctional Use of Upper Extremity (ataxia LUE)  SLP        TR      Anticipated Outcomes Item Anticipated Outcome  Self Feeding n/a  Swallowing      Basic self-care  Mod I  Toileting  Mod I   Bathroom Transfers Mod I  Bowel/Bladder     Transfers  mod I with transfers  Locomotion  mod I household ambulation with LRAD; supervision stairs and community  Communication     Cognition     Pain     Safety/Judgment  demonstrates safe mobility,follow safety plan,no injury   Therapy Plan: PT Intensity: Minimum of 1-2 x/day ,45 to 90 minutes PT Frequency: 5 out of 7 days PT Duration Estimated Length of Stay: 7 to 10 days OT Intensity: Minimum of 1-2 x/day, 45 to 90 minutes OT Frequency: 5 out of 7 days OT Duration/Estimated Length of Stay: 7-10 days         Team Interventions: Nursing Interventions Patient/Family Education, Skin Care/Wound Management, Discharge Planning, Disease Management/Prevention, Medication Management, Psychosocial Support  PT interventions Ambulation/gait training, Medical illustrator training, Discharge planning, Functional mobility training, Neuromuscular re-education, Patient/family education, Stair training, Therapeutic Activities, Therapeutic Exercise, UE/LE Strength taining/ROM, UE/LE  Coordination activities, Wheelchair propulsion/positioning  OT Interventions Balance/vestibular training, Cognitive remediation/compensation, Discharge planning, Community reintegration, Functional mobility training, Functional electrical stimulation, DME/adaptive equipment instruction, Neuromuscular re-education, Pain management, Patient/family education, Psychosocial support, Self Care/advanced ADL retraining, Therapeutic Activities, Therapeutic Exercise, UE/LE Strength taining/ROM, UE/LE Coordination activities, Visual/perceptual remediation/compensation  SLP Interventions    TR Interventions    SW/CM Interventions      Team Discharge Planning: Destination: PT-Home ,OT- Home , SLP-  Projected Follow-up: PT-Outpatient PT, OT-  Outpatient OT, SLP-  Projected Equipment Needs: PT-To be determined (likely rolling walker), OT- To be determined, SLP-  Equipment Details: PT- , OT-  Patient/family involved in discharge planning: PT- Patient,  OT-Patient, SLP-   MD ELOS: 7-10 days Medical Rehab Prognosis:  Excellent Assessment: The patient has been admitted for CIR therapies with the diagnosis of left pontine infarct. The team will be addressing functional mobility, strength, stamina, balance, safety, adaptive techniques and equipment, self-care, bowel and bladder mgt, patient and caregiver education, vestibular assessment and treat, pain control, ego support, community reintegration. Goals have been set at [place] Lope, MD, [facility]      See Team Conference Notes for weekly updates to the plan of care

## 2014-08-05 NOTE — Plan of Care (Signed)
Problem: Food- and Nutrition-Related Knowledge Deficit (NB-1.1) Goal: Nutrition education Formal process to instruct or train a patient/client in a skill or to impart knowledge to help patients/clients voluntarily manage or modify food choices and eating behavior to maintain or improve health. Outcome: Completed/Met Date Met:  08/05/14  RD consulted for nutrition education regarding diabetes.     Lab Results  Component Value Date    HGBA1C 8.1* 07/28/2014    RD provided "Carbohydrate Counting for People with Diabetes" handout from the Academy of Nutrition and Dietetics. Discussed different food groups and their effects on blood sugar, emphasizing carbohydrate-containing foods. Provided list of carbohydrates and recommended serving sizes of common foods.  Discussed importance of controlled and consistent carbohydrate intake throughout the day. Provided examples of ways to balance meals/snacks and encouraged intake of high-fiber, whole grain complex carbohydrates. Teach back method used.  Expect good compliance.  Body mass index is 26.29 kg/(m^2). Pt meets criteria for overweight based on current BMI.  Current diet order is heart healthy/carbohydrate modified, patient is consuming approximately 100% of meals at this time. Labs and medications reviewed. No further nutrition interventions warranted at this time. RD contact information provided. If additional nutrition issues arise, please re-consult RD.  Laurette Schimke [place], [place], [place]

## 2014-08-05 NOTE — Progress Notes (Signed)
Physical Therapy Session Note  Patient Details  Name: JUA KIKER MRN: [id number] Date of Birth: 09/14/1965  Today's Date: 08/05/2014 PT Individual Time: 0800-0900 PT Individual Time Calculation (min): 60 min   Short Term Goals: Week 1:  PT Short Term Goal 1 (Week 1): STG's = LTG's due to short ELOS  Skilled Therapeutic Interventions/Progress Updates:  Patient sitting edge of bed upon entering room. Patient reporting need to use bathroom. Patient ambulated with RW to bathroom with min assist. Patient performed toileting tasks (pant up/down and hygiene) with grab bar and supervision. Session focused on gait and balance using lite gait and treadmill x 10 minutes on 1.0 speed for 839 feet. Patient needed facilitation for left knee to prevent hyperextension and occasional manual assist for left hip extension and weight shift. Patient used [employer] balance system on weight shift, limits of stability, maze, and random control to work on center of gravity and control of weight shift and balance. Patient ambulated 30 feet sideways each direction using railing in hallway to work on hip stability/ control. Patient ambulated 150 feet back to room with RW and min assist facilitation at left shoulder for weight shift and balance. Patient left in wheelchair with all items in reach.  Therapy Documentation Precautions:  Precautions Precautions: Fall Restrictions Weight Bearing Restrictions: No   Pain: Pain Assessment Pain Assessment: No/denies pain   Locomotion : Ambulation Ambulation/Gait Assistance: 4: Min assist   See FIM for current functional status  Therapy/Group: Individual Therapy  Elder Love M 08/05/2014, 9:10 AM

## 2014-08-05 NOTE — Care Management Note (Signed)
Inpatient Rehabilitation Center Individual Statement of Services  Patient Name:  Adrian Neal  Date:  08/05/2014  Welcome to the [facility].  Our goal is to provide you with an individualized program based on your diagnosis and situation, designed to meet your specific needs.  With this comprehensive rehabilitation program, you will be expected to participate in at least 3 hours of rehabilitation therapies Monday-Friday, with modified therapy programming on the weekends.  Your rehabilitation program will include the following services:  Physical Therapy (PT), Occupational Therapy (OT), Speech Therapy (ST), 24 hour per day rehabilitation nursing, Therapeutic Recreaction (TR), Neuropsychology, Case Management (Social Worker), Rehabilitation Medicine, Nutrition Services and Pharmacy Services  Weekly team conferences will be held on Tuesdays to discuss your progress.  Your Social Worker will talk with you frequently to get your input and to update you on team discussions.  Team conferences with you and your family in attendance may also be held.  Expected length of stay: 7-10 days  Overall anticipated outcome: modified independent  Depending on your progress and recovery, your program may change. Your Social Worker will coordinate services and will keep you informed of any changes. Your Social Worker's name and contact numbers are listed  below.  The following services may also be recommended but are not provided by the [facility] will be made to provide these services after discharge if needed.  Arrangements include referral to agencies that provide these services.  Your insurance has been verified to be:  [facility] Your primary doctor is:  Dr. Wilson Singer  Pertinent information will be shared with your doctor and your  insurance company.  Social Worker:  [place], [place] or ([phone]   Information discussed with and copy given to patient by: Lennart Pall, 08/05/2014, 2:00 PM

## 2014-08-06 ENCOUNTER — Inpatient Hospital Stay (HOSPITAL_COMMUNITY): Payer: 59 | Admitting: Physical Therapy

## 2014-08-06 LAB — GLUCOSE, CAPILLARY
GLUCOSE-CAPILLARY: 192 mg/dL — AB (ref 70–99)
Glucose-Capillary: 142 mg/dL — ABNORMAL HIGH (ref 70–99)
Glucose-Capillary: 162 mg/dL — ABNORMAL HIGH (ref 70–99)
Glucose-Capillary: 176 mg/dL — ABNORMAL HIGH (ref 70–99)
Glucose-Capillary: 197 mg/dL — ABNORMAL HIGH (ref 70–99)
Glucose-Capillary: 83 mg/dL (ref 70–99)

## 2014-08-06 NOTE — Progress Notes (Signed)
Physical Therapy Session Note  Patient Details  Name: Adrian Neal MRN: [id number] Date of Birth: May 31, 1965  Today's Date: 08/06/2014 PT Individual Time: 0800-0900 PT Individual Time Calculation (min): 60 min   Short Term Goals: Week 1:  PT Short Term Goal 1 (Week 1): STG's = LTG's due to short ELOS  Skilled Therapeutic Interventions/Progress Updates:    Gait Training: PT instructs pt in ambulation with RW x 100' req SBA progressing to min-guard assist due to progressive L lean with fatigue - verbal cues to lean R with manual facilitation for weight shift, as well as cues to prevent L knee hyperextension.  PT places pt in Lite Gait harness for balance without body weight support and instructs pt at 1.0 for 12 minutes, totalling 0.19 miles - focus on motor control of L knee, preventing hyperextension in stance, pt demonstrates ability to control it 25% of the time, but with dual tasking conversation about children, quickly loses focus of L knee motor control and req verbal & tactile cues to focus on "keeping it bent" slightly.  PT instructs pt in ascending/descending 12 stairs with B rails req min-guard assist, initially up/down in step-to pattern leading first with L leg, then with R leg, then alternating.   Neuromuscular Reeducation: PT instructs pt in L knee motor control exercise with mirror feedback: squats while holding RW with focus on up phase of squat, controlling knee, preventing it from hyperextending as it straightens: 3 x 10 reps. Pt responds best with cues to keep L knee slightly bent as your body straightens up: 3 x 10 reps - pt demonstrates improved progressive control of L knee with repetitions, when it is moving towards terminal stance.   Pt needs continued work on ambulation safety - focusing on L knee control, balance, and long step length bilaterally. Continue per PT POC.   Therapy Documentation Precautions:  Precautions Precautions: Fall Restrictions Weight  Bearing Restrictions: No Vital Signs: Therapy Vitals Temp: 98.6 F (37 C) Temp Source: Oral Pulse Rate: 75 Resp: 18 BP: (!) 147/70 mmHg Patient Position (if appropriate): Sitting Oxygen Therapy SpO2: 99 % O2 Device: Not Delivered Pain: Pain Assessment Pain Assessment: No/denies pain  See FIM for current functional status  Therapy/Group: Individual Therapy  Torrance Frech M 08/06/2014, 7:54 AM

## 2014-08-06 NOTE — Progress Notes (Signed)
[facility]IC[facility] RE[facility]ABILIT[facility]     PROGRESS NOTE    Subjective/Complaints: Pt without new issues overnite, still clumsy on Left side Review of Systems - Negative except left arm weak, poor coordination Objective: Vital Signs: Blood pressure 140/40, pulse 87, temperature 98.6 F (37 C), temperature source Oral, resp. rate 18, weight 78.4 kg (172 lb 13.5 oz), SpO2 98 %. No results found.  Recent Labs  08/04/14 0730  WBC 8.3  HGB 14.0  HCT 40.7  PLT 138*    Recent Labs  08/04/14 0730  NA 138  K 3.8  CL 105  GLUCOSE 178*  BUN 18  CREATININE 1.57*  CALCIUM 8.6   CBG (last 3)   Recent Labs  08/05/14 2005 08/05/14 2359 08/06/14 0409  GLUCAP 314* 162* 142*    Wt Readings from Last 3 Encounters:  08/03/14 78.4 kg (172 lb 13.5 oz)  07/28/14 76.8 kg (169 lb 5 oz)  05/29/10 80.967 kg (178 lb 8 oz)    Physical Exam:  Constitutional: He is oriented to person, place, and time. He appears well-developed and well-nourished. comfortable HENT: oral mucosa pink and moist Head: Normocephalic and atraumatic.  Eyes: Conjunctivae are normal. Pupils are equal, round, and reactive to light.  Neck: Normal range of motion. Neck supple.  Cardiovascular: Normal rate and regular rhythm. no murmur Respiratory: Effort normal and breath sounds normal. No respiratory distress. He has no wheezes. He exhibits no tenderness.  GI: Soft. Bowel sounds are normal. He exhibits no distension. There is no tenderness.  Musculoskeletal: He exhibits no edema or tenderness.  Neurological: He is alert and oriented to person, place, and time.  Strength nearly 4+/5 to 5/5 in all 4's. Speech clear with improved volume. Follows commands without difficulty. Ataxia with left finger to nose (mild). Heel shin ok but pt states he needs to concentrate morePsychiatric:  Calm and appropriate Skin: Skin is warm and dry.  Psychiatric: He has a normal mood and affect. His speech is normal and  behavior is normal. Judgment and thought content normal. Cognition and memory are normal.    Assessment/Plan: 1. Functional deficits secondary to left pontine infarct which require 3+ hours per day of interdisciplinary therapy in a comprehensive inpatient rehab setting. Physiatrist is providing close team supervision and 24 hour management of active medical problems listed below. Physiatrist and rehab team continue to assess barriers to discharge/monitor patient progress toward functional and medical goals. FIM: FIM - Bathing Bathing Steps Patient Completed: Chest, Right Arm, Left Arm, Abdomen, Front perineal area, Buttocks, Right upper leg, Left upper leg, Right lower leg (including foot), Left lower leg (including foot) Bathing: 4: Steadying assist  FIM - Upper Body Dressing/Undressing Upper body dressing/undressing steps patient completed: Pull shirt over trunk, Put head through opening of pull over shirt/dress, Thread/unthread left sleeve of pullover shirt/dress, Thread/unthread right sleeve of pullover shirt/dresss Upper body dressing/undressing: 5: Set-up assist to: Obtain clothing/put away FIM - Lower Body Dressing/Undressing Lower body dressing/undressing steps patient completed: Thread/unthread right underwear leg, Thread/unthread left underwear leg, Pull underwear up/down, Thread/unthread right pants leg, Thread/unthread left pants leg, Don/Doff right shoe, Don/Doff left sock, Don/Doff right sock, Don/Doff left shoe, Pull pants up/down, Fasten/unfasten pants Lower body dressing/undressing: 4: Steadying Assist  FIM - Toileting Toileting steps completed by patient: Adjust clothing prior to toileting, Performs perineal hygiene, Adjust clothing after toileting Toileting Assistive Devices: Grab bar or rail for support Toileting: 5: Supervision: Safety issues/verbal cues  FIM - Radio producer Devices:  Gilford Rile, Grab bars Toilet Transfers: 5-To toilet/BSC:  Supervision (verbal cues/safety issues), 5-From toilet/BSC: Supervision (verbal cues/safety issues)  FIM - Control and instrumentation engineer Devices: Arm rests, Copy: 4: Bed > Chair or W/C: Min A (steadying Pt. > 75%), 4: Chair or W/C > Bed: Min A (steadying Pt. > 75%)  FIM - Locomotion: Wheelchair Distance: 150 Locomotion: Wheelchair: 5: Travels 150 ft or more: maneuvers on rugs and over door sills with supervision, cueing or coaxing FIM - Locomotion: Ambulation Locomotion: Ambulation Assistive Devices: Administrator Ambulation/Gait Assistance: 4: Min assist Locomotion: Ambulation: 4: Travels 150 ft or more with minimal assistance (Pt.>75%)  Comprehension Comprehension Mode: Auditory Comprehension: 7-Follows complex conversation/direction: With no assist  Expression Expression Mode: Verbal Expression: 7-Expresses complex ideas: With no assist  Social Interaction Social Interaction: 7-Interacts appropriately with others - No medications needed.  Problem Solving Problem Solving: 7-Solves complex problems: Recognizes & self-corrects  Memory Memory: 7-Complete Independence: No helper  Medical Problem List and Plan: 1. Functional deficits secondary to left pontine infarct 2. DVT Prophylaxis/Anticoagulation: Pharmaceutical: Lovenox 3. Pain Management: N/A 4. Mood: Endorses some anxiety due to current illness. LCSW to follow for evaluation and support.  5. Neuropsych: This patient is capable of making decisions on his own behalf. 6. Skin/Wound Care: Routine pressure relief measures. Maintain adequate nutrition and hydration status.  7. Fluids/Electrolytes/Nutrition: Monitor I/O. Encourage po intake.    8. HTN: Monitor BP every 8 hours. Continue  9. CKD?: follow labs serially--recheck monday 10. Hypokalemia: supplementing.  11. Esophagitis: carafate and PPI.  12. DM type 2:  CBG's ac/hs. Patient counting carbs and managing insulin pump  with appropriate bolus  -RN needs to discuss mgt with patient as he is competent to make decisions regarding pump   LOS (Days) [place] E 08/06/2014 7:26 AM

## 2014-08-07 ENCOUNTER — Inpatient Hospital Stay (HOSPITAL_COMMUNITY): Payer: 59 | Admitting: Physical Therapy

## 2014-08-07 ENCOUNTER — Inpatient Hospital Stay (HOSPITAL_COMMUNITY): Payer: 59 | Admitting: Occupational Therapy

## 2014-08-07 DIAGNOSIS — E108 Type 1 diabetes mellitus with unspecified complications: Secondary | ICD-10-CM

## 2014-08-07 LAB — GLUCOSE, CAPILLARY
GLUCOSE-CAPILLARY: 119 mg/dL — AB (ref 70–99)
GLUCOSE-CAPILLARY: 122 mg/dL — AB (ref 70–99)
GLUCOSE-CAPILLARY: 178 mg/dL — AB (ref 70–99)
Glucose-Capillary: 145 mg/dL — ABNORMAL HIGH (ref 70–99)
Glucose-Capillary: 216 mg/dL — ABNORMAL HIGH (ref 70–99)
Glucose-Capillary: 241 mg/dL — ABNORMAL HIGH (ref 70–99)
Glucose-Capillary: 44 mg/dL — CL (ref 70–99)
Glucose-Capillary: 81 mg/dL (ref 70–99)

## 2014-08-07 NOTE — Progress Notes (Signed)
Physical Therapy Session Note  Patient Details  Name: Adrian Neal MRN: [id number] Date of Birth: May 10, 1965  Today's Date: 08/07/2014 PT Individual Time: 1500-1530 PT Individual Time Calculation (min): 30 min   Short Term Goals: Week 1:  PT Short Term Goal 1 (Week 1): STG's = LTG's due to short ELOS  Skilled Therapeutic Interventions/Progress Updates:  Pt was seen bedside in the pm. Pt propelled w/c to gym with B LEs and S. Pt ascended/descended 4 stairs with B rails and min A. Pt ascended/descended 4 stairs with L rail and st. Cane with min A and verbal cues. Pt ascended/descended 11 stairs with R rail and cane with min A and verbal cues. Pt propelled w/c back to room with B LEs and S.   Therapy Documentation Precautions:  Precautions Precautions: Fall Restrictions Weight Bearing Restrictions: No General:   Pain: No c/o pain.    Locomotion : Ambulation Ambulation/Gait Assistance: 4: Min guard;4: Min assist   See FIM for current functional status  Therapy/Group: Individual Therapy  Dub Amis 08/07/2014, 4:16 PM

## 2014-08-07 NOTE — Progress Notes (Signed)
Physical Therapy Session Note  Patient Details  Name: Adrian Neal MRN: [id number] Date of Birth: 02/18/1966  Today's Date: 08/07/2014 PT Individual Time: 1115-1215 PT Individual Time Calculation (min): 60 min   Short Term Goals: Week 1:  PT Short Term Goal 1 (Week 1): STG's = LTG's due to short ELOS  Skilled Therapeutic Interventions/Progress Updates:  Pt was seen bedside in the am. Pt transferred supine to edge of bed with side rail and no assist. Pt transferred sit to stand with rolling walker and min guard. Pt ambulated in and out of bathroom with rolling walker and min guard. Pt performed toilet transfers with min guard and rolling walker. Pt ambulated about 150 feet with rolling walker and min guard to min A with verbal cues. Pt tends to lean L as pt fatigues. Pt performed alternating cone taps for LE strengthening and NMR, 4 sets x 10 reps each. Pt transferred w/c to edge of mat with rolling walker and min guard. Pt transferred edge of mat to supine with no assist. Pt performed unilateral bridging and bridging, 3 sets x 10 reps each. Pt transferred supine to edge of mat with no assist. Pt transferred edge of mat to w/c with rolling walker and min guard. Pt propelled w/c back to room with B LEs and S. Pt left sitting up with call bell within reach.   Therapy Documentation Precautions:  Precautions Precautions: Fall Restrictions Weight Bearing Restrictions: No General:   Pain: No c/o pain.    Locomotion : Ambulation Ambulation/Gait Assistance: 4: Min guard;4: Min assist   See FIM for current functional status  Therapy/Group: Individual Therapy  Dub Amis 08/07/2014, 3:51 PM

## 2014-08-07 NOTE — Progress Notes (Signed)
Social Work  Social Work Assessment and Plan  Patient Details  Name: Adrian Neal MRN: [id number] Date of Birth: 10/23/1965  Today's Date: 08/07/2014  Problem List:  Patient Active Problem List   Diagnosis Date Noted  . Ataxia   . CVA (cerebral infarction) 08/03/2014  . ARF (acute renal failure)   . Insulin dependent diabetes mellitus   . 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 Intractable nausea and vomiting 07/27/2014  . Vertigo 07/27/2014  . Essential hypertension 07/27/2014  . Type 1 diabetes mellitus 05/29/2010  . Hyperlipidemia 05/29/2010  . HYPERTENSION 05/29/2010  . 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 Hypertension   . Diabetes mellitus without complication     diagnosed at age 39  . Retinopathy due to secondary diabetes mellitus     right   Past Surgical History:  Past Surgical History  Procedure Laterality Date  . Eye surgery  1990    for retinopathy   . Cataract extraction w/ intraocular lens implant  1994   Social History:  reports that he quit smoking about 23 years ago. He does not have any smokeless tobacco history on file. He reports that he does not drink alcohol or use illicit drugs.  Family / Support Systems Marital Status: Married Patient Roles: Spouse, Parent, Other (Comment) (employee) Spouse/Significant Other: wife, Emiel Leong @ [phone] Children: 3 children:  daughter (33) in college and daughter (75) and son(6) at home Anticipated Caregiver: wife Ability/Limitations of Caregiver: no limitations Caregiver Availability: 24/7 Family Dynamics: Pt describes very close relationship with family and reports they will provide any support he needs.  Oldest daughter to be home for the summer from college in approx a month.  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 Legal Hisotry/Current Legal Issues: None Guardian/Conservator: None - per MD, pt capable of making decisions on his own behalf   Abuse/Neglect Physical Abuse: Denies Verbal Abuse: Denies Sexual Abuse: Denies Exploitation of patient/patient's resources: Denies Self-Neglect: Denies  Emotional Status Pt's affect, behavior adn adjustment status: Pt very pleasant, talkative and speaks openly about his feelings that this stroke was his "wake up call".  He admits to working long hours and "not taking care of myself...".  He does admit some apprehension about returning to a job that demands long hours combined with being almost an hour drive from home.  He denies any significant emotional distress and reports he is "really a glass half full kind of guy..."  Motivated for therapies.  Will monitor mood/ adjustment and refer to neuropsychology if indicated. Recent Psychosocial Issues: None Pyschiatric History: None Substance Abuse History: None  Patient / Family Perceptions, Expectations & Goals Pt/Family understanding of illness & functional limitations: pt and family with good understanding of his stroke which likely related to DM and HTN.  Good understanding of his current functional deficits/ need for CIR.   Premorbid pt/family roles/activities: All in household are completely independent;  pt working full-time and sole wage Artist for household. Anticipated changes in roles/activities/participation: Pt hopeful he will be able to return to work.  Very little change anticipated on the longer term as pt has mod i goals overall. Pt/family expectations/goals: "I just need to regroup and  think about how I am living my life.  I want to be fully independent again."  [employer]: None Premorbid Home  Care/DME Agencies: None Transportation available at discharge: yes  Discharge Planning Living Arrangements: Spouse/significant other, Children Support Systems: Spouse/significant other, Children, Friends/neighbors Type of Residence: Private residence Administrator, sports: Multimedia programmer (specify) Sports administrator) Financial Resources: Employment Museum/gallery curator Screen Referred: No Living Expenses: Higher education careers adviser Management: Patient Does the patient have any problems obtaining your medications?: No Home Management: pt and family share responsibilities Patient/Family Preliminary Plans: Pt fully intends to return home with his wife and children Social Work Anticipated Follow Up Needs: HH/OP, Support Group Expected length of stay: 7-10 days  Clinical Impression Very pleasant gentleman here following a CVA. Describes demanding work life and feels that this, paired with chronic HTN and DM, is the cause of his stroke.  Motivated for CIR and anticipates being able to reach an independent level and return to work at some point.  Denies any emotional distress - will monitor.  Jany Buckwalter 08/05/2014, 2:30 PM

## 2014-08-07 NOTE — Progress Notes (Addendum)
[facility] PHYSICAL MEDICINE & REHABILITATION     PROGRESS NOTE    Subjective/Complaints: Pt states his onset of DM was age 49 .Insulin pump x 7 yrs   Pt without new issues overnite, still clumsy on Left side Review of Systems - Negative except left arm weak, poor coordination Objective: Vital Signs: Blood pressure 151/63, pulse 85, temperature 97.7 F (36.5 C), temperature source Oral, resp. rate 16, weight 78.4 kg (172 lb 13.5 oz), SpO2 100 %. No results found. No results for input(s): WBC, HGB, HCT, PLT in the last 72 hours. No results for input(s): NA, K, CL, GLUCOSE, BUN, CREATININE, CALCIUM in the last 72 hours.  Invalid input(s): CO CBG (last 3)   Recent Labs  08/07/14 0403 08/07/14 0643 08/07/14 0712  GLUCAP 122* 145* 119*    Wt Readings from Last 3 Encounters:  08/03/14 78.4 kg (172 lb 13.5 oz)  07/28/14 76.8 kg (169 lb 5 oz)  05/29/10 80.967 kg (178 lb 8 oz)    Physical Exam:  Constitutional: He is oriented to person, place, and time. He appears well-developed and well-nourished. comfortable HENT: oral mucosa pink and moist Head: Normocephalic and atraumatic.  Eyes: Conjunctivae are normal. Pupils are equal, round, and reactive to light.  Neck: Normal range of motion. Neck supple.  Cardiovascular: Normal rate and regular rhythm. no murmur Respiratory: Effort normal and breath sounds normal. No respiratory distress. He has no wheezes. He exhibits no tenderness.  GI: Soft. Bowel sounds are normal. He exhibits no distension. There is no tenderness.  Musculoskeletal: He exhibits no edema or tenderness.  Neurological: He is alert and oriented to person, place, and time.  Strength nearly 4+/5 to 5/5 in all 4's. Speech clear with improved volume. Follows commands without difficulty. Ataxia with left finger to nose (mild). Heel shin ok but pt states he needs to concentrate morePsychiatric:  Calm and appropriate Skin: Skin is warm and dry.  Psychiatric: He has  a normal mood and affect. His speech is normal and behavior is normal. Judgment and thought content normal. Cognition and memory are normal.    Assessment/Plan: 1. Functional deficits secondary to left pontine infarct which require 3+ hours per day of interdisciplinary therapy in a comprehensive inpatient rehab setting. Physiatrist is providing close team supervision and 24 hour management of active medical problems listed below. Physiatrist and rehab team continue to assess barriers to discharge/monitor patient progress toward functional and medical goals. FIM: FIM - Bathing Bathing Steps Patient Completed: Chest, Right Arm, Left Arm, Abdomen, Front perineal area, Buttocks, Right upper leg, Left upper leg, Right lower leg (including foot), Left lower leg (including foot) Bathing: 4: Steadying assist  FIM - Upper Body Dressing/Undressing Upper body dressing/undressing steps patient completed: Pull shirt over trunk, Put head through opening of pull over shirt/dress, Thread/unthread left sleeve of pullover shirt/dress, Thread/unthread right sleeve of pullover shirt/dresss Upper body dressing/undressing: 5: Set-up assist to: Obtain clothing/put away FIM - Lower Body Dressing/Undressing Lower body dressing/undressing steps patient completed: Thread/unthread right underwear leg, Thread/unthread left underwear leg, Pull underwear up/down, Thread/unthread right pants leg, Thread/unthread left pants leg, Don/Doff right shoe, Don/Doff left sock, Don/Doff right sock, Don/Doff left shoe, Pull pants up/down, Fasten/unfasten pants Lower body dressing/undressing: 4: Steadying Assist  FIM - Toileting Toileting steps completed by patient: Adjust clothing prior to toileting, Performs perineal hygiene, Adjust clothing after toileting Toileting Assistive Devices: Grab bar or rail for support Toileting: 5: Supervision: Safety issues/verbal cues  FIM - Radio producer Devices:  Gilford Rile,  Grab bars Toilet Transfers: 5-To toilet/BSC: Supervision (verbal cues/safety issues), 5-From toilet/BSC: Supervision (verbal cues/safety issues)  FIM - Control and instrumentation engineer Devices: Walker, Arm rests Bed/Chair Transfer: 6: Supine > Sit: No assist, 4: Bed > Chair or W/C: Min A (steadying Pt. > 75%), 4: Chair or W/C > Bed: Min A (steadying Pt. > 75%)  FIM - Locomotion: Wheelchair Distance: 150 Locomotion: Wheelchair: 0: Activity did not occur FIM - Locomotion: Ambulation Locomotion: Ambulation Assistive Devices: Administrator Ambulation/Gait Assistance: 4: Min assist Locomotion: Ambulation: 4: Travels 150 ft or more with minimal assistance (Pt.>75%)  Comprehension Comprehension Mode: Auditory Comprehension: 7-Follows complex conversation/direction: With no assist  Expression Expression Mode: Verbal Expression: 7-Expresses complex ideas: With no assist  Social Interaction Social Interaction: 7-Interacts appropriately with others - No medications needed.  Problem Solving Problem Solving: 7-Solves complex problems: Recognizes & self-corrects  Memory Memory: 7-Complete Independence: No helper  Medical Problem List and Plan: 1. Functional deficits secondary to left pontine infarct 2. DVT Prophylaxis/Anticoagulation: Pharmaceutical: Lovenox 3. Pain Management: N/A 4. Mood: Endorses some anxiety due to current illness. LCSW to follow for evaluation and support.  5. Neuropsych: This patient is capable of making decisions on his own behalf. 6. Skin/Wound Care: Routine pressure relief measures. Maintain adequate nutrition and hydration status.  7. Fluids/Electrolytes/Nutrition: Monitor I/O. Encourage po intake.    8. HTN: Monitor BP every 8 hours. Continue  9. CKD?: follow labs serially--recheck monday 10. Hypokalemia: supplementing.  11. Esophagitis: carafate and PPI.  12. DM type 1:  CBG's ac/hs. Patient counting carbs and managing insulin pump  with appropriate bolus  -RN needs to discuss mgt with patient as he is competent to make decisions regarding pump   LOS (Days) [place] E 08/07/2014 7:44 AM

## 2014-08-07 NOTE — Progress Notes (Addendum)
Occupational Therapy Session Note  Patient Details  Name: Adrian Neal MRN: [id number] Date of Birth: 28-Jan-1966  Today's Date: 08/07/2014 OT Individual Time:  -   0800-0900  (60 min)  1st session   Short Term Goals: Week 1:  OT Short Term Goal 1 (Week 1): STGs=LTGs due to short ELOS     Skilled Therapeutic Interventions/Progress Updates:      1st session:   Focus of treatment was  transfers,  Neuro-muscular reeducation, sitting balance, standing balance,  therapeutic activities,  postural control.  Pt was sitting on EOB bed upon OT arrival and taking pills.  Pt. Threw up during this period.  After cleaning floor, pt used RW to go to sink and brush teeth at minimal assist for mobility and SBA for activity and balance.  Pt propelled wc to gym.  Practiced balance activities with standing and functional mobility with tai chi movements, heel toe movements and walking with LUE weight bearing on table without walker.  Provided cues for upright posture, heel toe movements.  .Balanced on foam board with LUE on table.  Ptracticed lifting hand off table for 5 to 10 counts x 5.  Did step up with RLE on 5 inch step with WB on LUE and LLE for increasing Left sided weight bearing.  Posture improved after doing the foam board exercises such that pt was more upright.  .       Did quadra ped balance exercises on mat with core strength and LUE/LLE weight bearing.   Pt. Taken back to room in wc and left in room with all needs in reach.   Therapy Documentation Precautions:  Precautions Precautions: Fall Restrictions Weight Bearing Restrictions: No   Pain:  none   Other Treatments:     2nd session:  Time:  1300-1400  (60 min) Pain:  none Individual session:  Engaged in therapeutic bathing and dressing at shower level.  Pt. amubulated with RW to gather supplies with minimal to SBA for balance.  Pt maneurvered through small areas and did side stepping at 2 dirrerent locations.  Pt used shower seat and stood  for peri care with SBA.  Pt needed 1 cue to use wall support when standing to dress.   Recommend doing bathtub transfer and determine tub transfer bench with a grab bar for support when standing.  Left pt in wc with call bell,phone within reach.     See FIM for current functional status  Therapy/Group: Individual Therapy  Lisa Roca 08/07/2014, 7:18 AM

## 2014-08-07 NOTE — Significant Event (Signed)
Hypoglycemic Event  CBG: 44  Treatment: 15 GM carbohydrate snack  Symptoms: None  Follow-up CBG: Time:2052 CBG Result 81   Possible Reasons for Event: Unknown  Comments/MD notified:no    Adrian Neal  Remember to initiate Hypoglycemia Order Set & complete

## 2014-08-08 ENCOUNTER — Inpatient Hospital Stay (HOSPITAL_COMMUNITY): Payer: 59

## 2014-08-08 DIAGNOSIS — I6359 Cerebral infarction due to unspecified occlusion or stenosis of other cerebral artery: Secondary | ICD-10-CM

## 2014-08-08 LAB — COMPREHENSIVE METABOLIC PANEL
ALBUMIN: 2.8 g/dL — AB (ref 3.5–5.2)
ALT: 180 U/L — ABNORMAL HIGH (ref 0–53)
ANION GAP: 12 (ref 5–15)
AST: 95 U/L — ABNORMAL HIGH (ref 0–37)
Alkaline Phosphatase: 97 U/L (ref 39–117)
BILIRUBIN TOTAL: 0.6 mg/dL (ref 0.3–1.2)
BUN: 22 mg/dL (ref 6–23)
CO2: 23 mmol/L (ref 19–32)
Calcium: 8.7 mg/dL (ref 8.4–10.5)
Chloride: 104 mmol/L (ref 96–112)
Creatinine, Ser: 1.57 mg/dL — ABNORMAL HIGH (ref 0.50–1.35)
GFR calc Af Amer: 59 mL/min — ABNORMAL LOW (ref >90)
GFR, EST NON AFRICAN AMERICAN: 51 mL/min — AB (ref >90)
GLUCOSE: 115 mg/dL — AB (ref 70–99)
POTASSIUM: 4.2 mmol/L (ref 3.5–5.1)
SODIUM: 139 mmol/L (ref 135–145)
Total Protein: 5.2 g/dL — ABNORMAL LOW (ref 6.0–8.3)

## 2014-08-08 LAB — GLUCOSE, CAPILLARY
GLUCOSE-CAPILLARY: 208 mg/dL — AB (ref 70–99)
GLUCOSE-CAPILLARY: 338 mg/dL — AB (ref 70–99)
Glucose-Capillary: 216 mg/dL — ABNORMAL HIGH (ref 70–99)
Glucose-Capillary: 127 mg/dL — ABNORMAL HIGH (ref 70–99)
Glucose-Capillary: 136 mg/dL — ABNORMAL HIGH (ref 70–99)
Glucose-Capillary: 137 mg/dL — ABNORMAL HIGH (ref 70–99)
Glucose-Capillary: 254 mg/dL — ABNORMAL HIGH (ref 70–99)

## 2014-08-08 NOTE — Progress Notes (Signed)
Physical Therapy Session Note  Patient Details  Name: Adrian Neal MRN: [id number] Date of Birth: [birth year]  Today's Date: 08/08/2014 PT Individual Time: 1000-1100 PT Individual Time Calculation (min): 60 min   Short Term Goals: Week 1:  PT Short Term Goal 1 (Week 1): STG's = LTG's due to short ELOS  Skilled Therapeutic Interventions/Progress Updates:    Pt received supine in bed, agreeable to participate in therapy. Session focused on gait training, endurance. Pt ambulated around unit w/ RW and MinGuard w/ intermittent MinA due to slight LOB. Instructed pt in treadmill training with use of Litegait harness with pt walking 20 minutes at 1.0 miles per hour. Therapist provided intermittent facilitation for preventing R knee hyperextension during stance phase and mod cueing for increased step length on L. After rest break instructed pt in slow mini-squats to increase eccentric quad control on R. Pt tolerated session well. Pt left seated in w/c w/ all needs within reach.   Therapy Documentation Precautions:  Precautions Precautions: Fall Restrictions Weight Bearing Restrictions: No Pain:  No/denies pain  See FIM for current functional status  Therapy/Group: Individual Therapy  Rada Hay  Rada Hay, PT, DPT 08/08/2014, 7:35 AM

## 2014-08-08 NOTE — Progress Notes (Signed)
[place] PHYSICAL MEDICINE & REHABILITATION     PROGRESS NOTE    Subjective/Complaints: No new issues. Had good weekend.  Still some dizziness and ataxia on left Review of Systems - Negative except left arm weak, poor coordination Objective: Vital Signs: Blood pressure 158/67, pulse 85, temperature 98.3 F (36.8 C), temperature source Oral, resp. rate 18, weight 78.4 kg (172 lb 13.5 oz), SpO2 97 %. No results found. No results for input(s): WBC, HGB, HCT, PLT in the last 72 hours. No results for input(s): NA, K, CL, GLUCOSE, BUN, CREATININE, CALCIUM in the last 72 hours.  Invalid input(s): CO CBG (last 3)   Recent Labs  08/07/14 2054 08/08/14 0013 08/08/14 0408  GLUCAP 81 208* 216*    Wt Readings from Last 3 Encounters:  08/03/14 78.4 kg (172 lb 13.5 oz)  07/28/14 76.8 kg (169 lb 5 oz)  05/29/10 80.967 kg (178 lb 8 oz)    Physical Exam:  Constitutional: He is oriented to person, place, and time. He appears well-developed and well-nourished. comfortable HENT: oral mucosa pink and moist Head: Normocephalic and atraumatic.  Eyes: Conjunctivae are normal. Pupils are equal, round, and reactive to light.  Neck: Normal range of motion. Neck supple.  Cardiovascular: Normal rate and regular rhythm. no murmur Respiratory: Effort normal and breath sounds normal. No respiratory distress. He has no wheezes. He exhibits no tenderness.  GI: Soft. Bowel sounds are normal. He exhibits no distension. There is no tenderness.  Musculoskeletal: He exhibits no edema or tenderness.  Neurological: He is alert and oriented to person, place, and time.  Strength nearly 4+/5 to 5/5 in all 4's. Speech clear with improved volume. Follows commands without difficulty. Ataxia with left finger to nose (mild). Heel shin ok but pt states he needs to concentrate morePsychiatric:  Calm and appropriate Skin: Skin is warm and dry.  Psychiatric: He has a normal mood and affect. His speech is normal  and behavior is normal. Judgment and thought content normal. Cognition and memory are normal.    Assessment/Plan: 1. Functional deficits secondary to left pontine infarct which require 3+ hours per day of interdisciplinary therapy in a comprehensive inpatient rehab setting. Physiatrist is providing close team supervision and 24 hour management of active medical problems listed below. Physiatrist and rehab team continue to assess barriers to discharge/monitor patient progress toward functional and medical goals. FIM: FIM - Bathing Bathing Steps Patient Completed: Chest, Right Arm, Left Arm, Abdomen, Front perineal area, Buttocks, Right upper leg, Left upper leg, Right lower leg (including foot), Left lower leg (including foot) Bathing: 4: Steadying assist  FIM - Upper Body Dressing/Undressing Upper body dressing/undressing steps patient completed: Pull shirt over trunk, Put head through opening of pull over shirt/dress, Thread/unthread left sleeve of pullover shirt/dress, Thread/unthread right sleeve of pullover shirt/dresss Upper body dressing/undressing: 5: Set-up assist to: Obtain clothing/put away FIM - Lower Body Dressing/Undressing Lower body dressing/undressing steps patient completed: Thread/unthread right underwear leg, Thread/unthread left underwear leg, Pull underwear up/down, Thread/unthread right pants leg, Thread/unthread left pants leg, Don/Doff right shoe, Don/Doff left sock, Don/Doff right sock, Don/Doff left shoe, Pull pants up/down, Fasten/unfasten pants Lower body dressing/undressing: 4: Steadying Assist  FIM - Toileting Toileting steps completed by patient: Adjust clothing prior to toileting, Performs perineal hygiene, Adjust clothing after toileting Toileting Assistive Devices: Grab bar or rail for support Toileting: 5: Supervision: Safety issues/verbal cues  FIM - Radio producer Devices: Environmental consultant, Product manager Transfers: 5-To toilet/BSC:  Supervision (verbal cues/safety issues),  5-From toilet/BSC: Supervision (verbal cues/safety issues)  FIM - Bed/Chair Transfer Bed/Chair Transfer Assistive Devices: Walker, Bed rails, Arm rests Bed/Chair Transfer: 6: Supine > Sit: No assist, 4: Chair or W/C > Bed: Min A (steadying Pt. > 75%), 4: Bed > Chair or W/C: Min A (steadying Pt. > 75%)  FIM - Locomotion: Wheelchair Distance: 150 Locomotion: Wheelchair: 5: Travels 150 ft or more: maneuvers on rugs and over door sills with supervision, cueing or coaxing FIM - Locomotion: Ambulation Locomotion: Ambulation Assistive Devices: Administrator Ambulation/Gait Assistance: 4: Min guard, 4: Min assist Locomotion: Ambulation: 4: Travels 150 ft or more with minimal assistance (Pt.>75%)  Comprehension Comprehension Mode: Auditory Comprehension: 7-Follows complex conversation/direction: With no assist  Expression Expression Mode: Verbal Expression: 7-Expresses complex ideas: With no assist  Social Interaction Social Interaction: 7-Interacts appropriately with others - No medications needed.  Problem Solving Problem Solving: 7-Solves complex problems: Recognizes & self-corrects  Memory Memory: 7-Complete Independence: No helper  Medical Problem List and Plan: 1. Functional deficits secondary to left pontine infarct 2. DVT Prophylaxis/Anticoagulation: Pharmaceutical: Lovenox 3. Pain Management: N/A 4. Mood: Endorses some anxiety due to current illness. LCSW to follow for evaluation and support.  5. Neuropsych: This patient is capable of making decisions on his own behalf. 6. Skin/Wound Care: Routine pressure relief measures. Maintain adequate nutrition and hydration status.  7. Fluids/Electrolytes/Nutrition: Monitor I/O. Encourage po intake.    8. HTN: Monitor BP every 8 hours. Continue  9. CKD?: follow labs serially--recheck today 10. Hypokalemia: supplementing.  11. Esophagitis: carafate and PPI.  12. DM type 1:  CBG's  ac/hs. Patient counting carbs and managing insulin pump with appropriate bolus  -RN to continue working with pt regarding cbg assessment/pump   LOS (Days) 5 A FACE TO FACE EVALUATION WAS PERFORMED  Adrian Neal T 08/08/2014 8:05 AM

## 2014-08-08 NOTE — Progress Notes (Signed)
Physical Therapy Session Note  Patient Details  Name: Adrian Neal MRN: [id number] Date of Birth: 09/12/1965  Today's Date: 08/08/2014 PT Individual Time: 1430-1600 PT Individual Time Calculation (min): 90 min   Short Term Goals: Week 1:  PT Short Term Goal 1 (Week 1): STG's = LTG's due to short ELOS  Skilled Therapeutic Interventions/Progress Updates:   neuromuscular re-education via demo, visual feedback, VCs for alternating reciprocal L and RLE movements in sitting, increasing speed and complexity as able; sitting "soccer drills" L foot manipulating ball on floor in all directions; NuStep x 5 min with bil UEs and bil LEs focusing on trunk rotation, workload 4 rated 12 on BOR scale; x 5 minutes using bil LEs only, workload 6 focusing on L hip neutral rotation. In unsupported sitting, reaching R with L hand to activate L trunk for lengthening; sit>< stand blocked practice without use of UEs with increasing bias to L; standing on compliant wedge to facilitate balance strategies; gait without AD, mod > max assist for continual LOB backwards with LLE stance phase. Gait up/down 12 steps R rail, step to> step through technique ascending and descending to challenge L knee eccentric control. In standing, calf raises, toe raises in standing with 3 second hold for facilitation of balance strategies.  In standing, L hip abd, with eversion elicited, in order to kick ball with side of L foot.  Therapeutic activities: kicking ball with R or LLE in standing with 1 UE support ; L hand card sorting in unsupported sitting,  Gait with RW x 150' with min guard assist, RW, except for 1 LOB to L  as he turned R, requiring mod assist to recover balance.    Therapy Documentation Precautions:  Precautions Precautions: Fall Restrictions Weight Bearing Restrictions: No  Pain: Pain Assessment Pain Assessment: No/denies pain   Locomotion : Ambulation Ambulation/Gait Assistance: 3: Mod assist     See FIM  for current functional status  Therapy/Group: Individual Therapy  Janaa Acero 08/08/2014, 4:32 PM

## 2014-08-08 NOTE — Progress Notes (Signed)
Occupational Therapy Session Note  Patient Details  Name: Adrian Neal MRN: [id number] Date of Birth: [birth year]  Today's Date: 08/08/2014 OT Individual Time: 0800-0900 OT Individual Time Calculation (min): 60 min    Short Term Goals: Week 1:  OT Short Term Goal 1 (Week 1): STGs=LTGs due to short ELOS  Skilled Therapeutic Interventions/Progress Updates:    Pt resting in bed upon arrival and agreeable to therapy.  Pt c/o 3.5/10 dizziness described as though his body was off center.  Pt amb with RW to therapy gym and engaged in dynamic standing tasks on compliant and noncompliant surfaces when reaching for objects outside BOS.  Pt performed tasks with and without RW, requiring steady A in both instances.  Pt returned to room and sat EOB while awaiting next therapy.  Focus on activity tolerance, functional amb with RW, dynamic standing balance, and safety awareness.  Therapy Documentation Precautions:  Precautions Precautions: Fall Restrictions Weight Bearing Restrictions: No Pain:  Pt denied pain  See FIM for current functional status  Therapy/Group: Individual Therapy  Leroy Libman 08/08/2014, 9:03 AM

## 2014-08-09 ENCOUNTER — Inpatient Hospital Stay (HOSPITAL_COMMUNITY): Payer: 59

## 2014-08-09 ENCOUNTER — Inpatient Hospital Stay (HOSPITAL_COMMUNITY): Payer: 59 | Admitting: *Deleted

## 2014-08-09 ENCOUNTER — Inpatient Hospital Stay (HOSPITAL_COMMUNITY): Payer: 59 | Admitting: Physical Therapy

## 2014-08-09 ENCOUNTER — Inpatient Hospital Stay (HOSPITAL_COMMUNITY): Payer: 59 | Admitting: Occupational Therapy

## 2014-08-09 LAB — GLUCOSE, CAPILLARY
GLUCOSE-CAPILLARY: 117 mg/dL — AB (ref 70–99)
GLUCOSE-CAPILLARY: 137 mg/dL — AB (ref 70–99)
GLUCOSE-CAPILLARY: 169 mg/dL — AB (ref 70–99)
GLUCOSE-CAPILLARY: 188 mg/dL — AB (ref 70–99)
Glucose-Capillary: 186 mg/dL — ABNORMAL HIGH (ref 70–99)
Glucose-Capillary: 130 mg/dL — ABNORMAL HIGH (ref 70–99)

## 2014-08-09 NOTE — Progress Notes (Signed)
Physical Therapy Session Note  Patient Details  Name: Adrian Neal MRN: [id number] Date of Birth: October 17, 1965  Today's Date: 08/09/2014 PT Individual Time: 1110-1210 PT Individual Time Calculation (min): 60 min   Short Term Goals: Week 1:  PT Short Term Goal 1 (Week 1): STG's = LTG's due to short ELOS  Skilled Therapeutic Interventions/Progress Updates:  Tx focused on static and dynamic balance training, gait with RW in varying conditions, and safety education. Pt demonstrates significant risk of falls and decreased righting reactions. Hip and ankle strategies improved with practice during tx and mirror for visual feedback, but pt continues to be limited by narrow BOS, L lateral leans, and delayed balance responses without UE support. Discussed fall risk and home safety. Pt expressed that he will be doing his "own therapy" at home due to insurance likely not covering follow-up.   Gait in controlled and carpeted settings 1x200' and 2x150' with up to Min A during transitional surfaces, busy environments, and during turns due to narrow L stance. Pt given cues to reduce UE weight bearing to increase LE/trunk balance strategies.   Stair training x12 with 1 rail and Min A needed for steadying due to decreased accuracy of step placement and LUE coordination for assist. Pt able to alternate steps for increased NMR and strengthening.   NMR without UE support via the following with mirror for visual feedback, and up to Max A for L lateral LOB - static standing - parallel standing - very challenging for pt - lateral weight shifts without UE support - mini squats  - lateral step-overs stick R/L x10 - up to Max A for balance recovery - Step taps to 2" step R/L with up to max A for LOB in all directions, several assisted sits to mat Pt responds well to cues for decreasing UE reaching to recover, and instead eliciting hip/ankle strategies  Pt returned to room and wife had arrived, however she did not  look up from her phone or engage PT in any discussion about pt's progress or status when asked.       Therapy Documentation Precautions:  Precautions Precautions: Fall Restrictions Weight Bearing Restrictions: No   Pain: Pain Assessment Pain Assessment: No/denies pain  See FIM for current functional status  Therapy/Group: Individual Therapy  Kennieth Rad, PT, DPT    08/09/2014, 11:53 AM

## 2014-08-09 NOTE — Progress Notes (Signed)
Physical Therapy Session Note  Patient Details  Name: Adrian Neal MRN: [id number] Date of Birth: 11-11-1965  Today's Date: 08/09/2014 PT Individual Time: 1530-1600 PT Individual Time Calculation (min): 30 min   Short Term Goals: Week 1:  PT Short Term Goal 1 (Week 1): STG's = LTG's due to short ELOS  Skilled Therapeutic Interventions/Progress Updates:   Session focused on functional ambulation, NMR, static/dynamic standing balance. Patient received sitting edge of bed, gait using RW room > therapy gym with overall supervision, cues for step length, wider BOS, keeping RW safe distance from body, and decreased UE dependence on RW. Mirror in front for visual feedback, patient performed alt step taps to 4" step with focus on lateral weight shifting, midline orientation, and graded movement, supervision-min A with multiple LOB requiring max assist to recover or assist to return to sitting on mat. Patient relied heavily on moving head to correct balance, requiring cues/education for hip strategy and weight shifting. In standing, multidirectional perturbations from therapist with patient demonstrating adequate ankle strategy with no LOB noted. Standing without UE support, heel/toe raises with S-min A to work on balance strategies. Patient ambulated with R HHA with focus on weight shifting, step length, and widening BOS while kicking yoga block with LLE, mod A. Patient returned to room using RW with S and left sitting in wheelchair with all needs within reach.   Therapy Documentation Precautions:  Precautions Precautions: Fall Restrictions Weight Bearing Restrictions: No Pain: Pain Assessment Pain Assessment: No/denies pain  See FIM for current functional status  Therapy/Group: Individual Therapy  Laretta Alstrom 08/09/2014, 4:07 PM

## 2014-08-09 NOTE — Progress Notes (Signed)
Occupational Therapy Note  Patient Details  Name: Adrian Neal MRN: [id number] Date of Birth: 1965-06-21  Today's Date: 08/09/2014 OT Individual Time: 1000-1100 OT Individual Time Calculation (min): 60 min   Pt denied pain Individual Therapy  Pt amb with RW to tub room to practice stepping over into tub, using wall for support.  Pt performed task with supervision.  Pt transitioned to ADL apartment and practiced retrieving items from refrigerato rand cabinets. Pt also practiced retrieving items from floor, using the RW for support.  Pt issued walker bag for use after discharge.  Pt continues to exhibit difficulty when turning corners and with head movements while ambulating.  Pt exhibits slight LOB with these movements and requires min A to correct.  Focus on activity tolerance, functional amb with RW, functional tub transfers, dynamic standing balance, and safety awareness.   Leotis Shames [facility] 08/09/2014, 11:06 AM

## 2014-08-09 NOTE — Progress Notes (Addendum)
[place] PHYSICAL MEDICINE & REHABILITATION     PROGRESS NOTE    Subjective/Complaints: Feeling well. No headaches. Happy with progress. Has paperwork for [place] to fill out. Review of Systems - Negative except left arm weak, poor coordination Objective: Vital Signs: Blood pressure 158/63, pulse 85, temperature 98.3 F (36.8 C), temperature source Oral, resp. rate 18, weight 78.4 kg (172 lb 13.5 oz), SpO2 97 %. No results found. No results for input(s): WBC, HGB, HCT, PLT in the last 72 hours.  Recent Labs  08/08/14 0724  NA 139  K 4.2  CL 104  GLUCOSE 115*  BUN 22  CREATININE 1.57*  CALCIUM 8.7   CBG (last 3)   Recent Labs  08/08/14 2040 08/08/14 2313 08/09/14 0016  GLUCAP 338* 254* 188*    Wt Readings from Last 3 Encounters:  08/03/14 78.4 kg (172 lb 13.5 oz)  07/28/14 76.8 kg (169 lb 5 oz)  05/29/10 80.967 kg (178 lb 8 oz)    Physical Exam:  Constitutional: He is oriented to person, place, and time. He appears well-developed and well-nourished. comfortable HENT: oral mucosa pink and moist Head: Normocephalic and atraumatic.  Eyes: Conjunctivae are normal. Pupils are equal, round, and reactive to light.  Neck: Normal range of motion. Neck supple.  Cardiovascular: Normal rate and regular rhythm. no murmur Respiratory: Effort normal and breath sounds normal. No respiratory distress. He has no wheezes. He exhibits no tenderness.  GI: Soft. Bowel sounds are normal. He exhibits no distension. There is no tenderness.  Musculoskeletal: He exhibits no edema or tenderness.  Neurological: He is alert and oriented to person, place, and time.  Strength nearly 4+/5 to 5/5 in all 4's. Speech clear with improved volume. Follows commands without difficulty. Ataxia with left finger to nose (mild). Heel shin ok but pt states he needs to concentrate morePsychiatric:  Calm and appropriate Skin: Skin is warm and dry.  Psychiatric: He has a normal mood and affect. His  speech is normal and behavior is normal. Judgment and thought content normal. Cognition and memory are normal.    Assessment/Plan: 1. Functional deficits secondary to left pontine infarct which require 3+ hours per day of interdisciplinary therapy in a comprehensive inpatient rehab setting. Physiatrist is providing close team supervision and 24 hour management of active medical problems listed below. Physiatrist and rehab team continue to assess barriers to discharge/monitor patient progress toward functional and medical goals. FIM: FIM - Bathing Bathing Steps Patient Completed: Chest, Right Arm, Left Arm, Abdomen, Front perineal area, Buttocks, Right upper leg, Left upper leg, Right lower leg (including foot), Left lower leg (including foot) Bathing: 4: Steadying assist  FIM - Upper Body Dressing/Undressing Upper body dressing/undressing steps patient completed: Pull shirt over trunk, Put head through opening of pull over shirt/dress, Thread/unthread left sleeve of pullover shirt/dress, Thread/unthread right sleeve of pullover shirt/dresss Upper body dressing/undressing: 5: Set-up assist to: Obtain clothing/put away FIM - Lower Body Dressing/Undressing Lower body dressing/undressing steps patient completed: Thread/unthread right underwear leg, Thread/unthread left underwear leg, Pull underwear up/down, Thread/unthread right pants leg, Thread/unthread left pants leg, Don/Doff right shoe, Don/Doff left sock, Don/Doff right sock, Don/Doff left shoe, Pull pants up/down, Fasten/unfasten pants Lower body dressing/undressing: 4: Steadying Assist  FIM - Toileting Toileting steps completed by patient: Adjust clothing prior to toileting, Performs perineal hygiene, Adjust clothing after toileting Toileting Assistive Devices: Grab bar or rail for support Toileting: 5: Supervision: Safety issues/verbal cues  FIM - Radio producer Devices: Environmental consultant, Grab bars  Toilet Transfers:  5-To toilet/BSC: Supervision (verbal cues/safety issues), 5-From toilet/BSC: Supervision (verbal cues/safety issues)  FIM - Control and instrumentation engineer Devices: Walker, Bed rails, Arm rests Bed/Chair Transfer: 0: Activity did not occur  FIM - Locomotion: Wheelchair Distance: 150 Locomotion: Wheelchair: 0: Activity did not occur FIM - Locomotion: Ambulation Locomotion: Ambulation Assistive Devices: Administrator Ambulation/Gait Assistance: 3: Mod assist Locomotion: Ambulation: 3: Travels 150 ft or more with moderate assistance (Pt: 50 - 74%)  Comprehension Comprehension Mode: Auditory Comprehension: 6-Follows complex conversation/direction: With extra time/assistive device  Expression Expression Mode: Verbal Expression: 6-Expresses complex ideas: With extra time/assistive device  Social Interaction Social Interaction: 6-Interacts appropriately with others with medication or extra time (anti-anxiety, antidepressant).  Problem Solving Problem Solving: 7-Solves complex problems: Recognizes & self-corrects  Memory Memory: 7-Complete Independence: No helper  Medical Problem List and Plan: 1. Functional deficits secondary to left pontine infarct 2. DVT Prophylaxis/Anticoagulation: Pharmaceutical: Lovenox 3. Pain Management: N/A 4. Mood: Endorses some anxiety due to current illness. LCSW to follow for evaluation and support.  5. Neuropsych: This patient is capable of making decisions on his own behalf. 6. Skin/Wound Care: Routine pressure relief measures. Maintain adequate nutrition and hydration status.  7. Fluids/Electrolytes/Nutrition: Monitor I/O. Encourage po intake.    8. HTN: Monitor BP every 8 hours. Continue  9. CKD?: follow labs serially--   -outpt follow up  -also need to recheck LFT's which are trending up 10. Hypokalemia: supplementing.  11. Esophagitis: carafate and PPI.  12. DM type 1:  Inconsistent to poor control.   -CBG's ac/hs.  Patient counting carbs and managing insulin pump with appropriate bolus  -RN to continue working with pt regarding cbg assessment/pump   LOS (Days) 6 A FACE TO FACE EVALUATION WAS PERFORMED  SWARTZ,ZACHARY T 08/09/2014 7:51 AM

## 2014-08-09 NOTE — Progress Notes (Signed)
Occupational Therapy Session Note  Patient Details  Name: Adrian Neal MRN: [id number] Date of Birth: 07-10-1965  Today's Date: 08/09/2014 OT Individual Time: [id number] Individual Time Calculation (min): 60 min    Short Term Goals: Week 1:  OT Short Term Goal 1 (Week 1): STGs=LTGs due to short ELOS  Skilled Therapeutic Interventions/Progress Updates:    1:1 self care retraining at shower level with focus on functional ambulation with RW, sit <> stands without UE support, core strengthening, dynamic standing balance, standing balance in shower without use of grab bar (simulating situation at home), etc. Pt ambulated to gym after getting dressed with VC for RW safety and technique to decr burden on UEs and for smoother movement to decr fall risk.  In gym focused on dynamic balance with alternating LEs to tapping onto a wooden box (5in) with focus on dynamic standing balance, postural control at midline, weight shifts with control etc. Pt required min to mod A during activity to maintain balance. returned to room with RW with minA and in w/c in prep fo next session.  Therapy Documentation Precautions:  Precautions Precautions: Fall Restrictions Weight Bearing Restrictions: No Pain: Pain Assessment Pain Assessment: No/denies pain  See FIM for current functional status  Therapy/Group: Individual Therapy  Willeen Cass [facility] 08/09/2014, 10:13 AM

## 2014-08-10 ENCOUNTER — Inpatient Hospital Stay (HOSPITAL_COMMUNITY): Payer: 59

## 2014-08-10 DIAGNOSIS — E1021 Type 1 diabetes mellitus with diabetic nephropathy: Secondary | ICD-10-CM

## 2014-08-10 LAB — COMPREHENSIVE METABOLIC PANEL
ALT: 143 U/L — ABNORMAL HIGH (ref 0–53)
AST: 56 U/L — ABNORMAL HIGH (ref 0–37)
Albumin: 3 g/dL — ABNORMAL LOW (ref 3.5–5.2)
Alkaline Phosphatase: 91 U/L (ref 39–117)
Anion gap: 9 (ref 5–15)
BUN: 23 mg/dL (ref 6–23)
CALCIUM: 9 mg/dL (ref 8.4–10.5)
CHLORIDE: 102 mmol/L (ref 96–112)
CO2: 27 mmol/L (ref 19–32)
CREATININE: 1.72 mg/dL — AB (ref 0.50–1.35)
GFR, EST AFRICAN AMERICAN: 52 mL/min — AB (ref >90)
GFR, EST NON AFRICAN AMERICAN: 45 mL/min — AB (ref >90)
Glucose, Bld: 101 mg/dL — ABNORMAL HIGH (ref 70–99)
Potassium: 4.3 mmol/L (ref 3.5–5.1)
Sodium: 138 mmol/L (ref 135–145)
Total Bilirubin: 0.7 mg/dL (ref 0.3–1.2)
Total Protein: 5.4 g/dL — ABNORMAL LOW (ref 6.0–8.3)

## 2014-08-10 LAB — GLUCOSE, CAPILLARY
GLUCOSE-CAPILLARY: 118 mg/dL — AB (ref 70–99)
GLUCOSE-CAPILLARY: 121 mg/dL — AB (ref 70–99)
GLUCOSE-CAPILLARY: 127 mg/dL — AB (ref 70–99)
GLUCOSE-CAPILLARY: 93 mg/dL (ref 70–99)
Glucose-Capillary: 122 mg/dL — ABNORMAL HIGH (ref 70–99)
Glucose-Capillary: 67 mg/dL — ABNORMAL LOW (ref 70–99)
Glucose-Capillary: 111 mg/dL — ABNORMAL HIGH (ref 70–99)

## 2014-08-10 NOTE — Progress Notes (Signed)
Physical Therapy Session Note  Patient Details  Name: Adrian Neal MRN: [id number] Date of Birth: 08/18/1965  Today's Date: 08/10/2014 PT Individual Time: 0800-0900 PT Individual Time Calculation (min): 60 min   Session 2 Time: 1450-1550 Time Calculation (min): 60 min  Short Term Goals: Week 1:  PT Short Term Goal 1 (Week 1): STG's = LTG's due to short ELOS  Skilled Therapeutic Interventions/Progress Updates:    Session 1: Pt received seated EOB, agreeable to participate in therapy. Session focused on general strengthening, balance HEP, gait training. Pt ambulated to/from rehab gym w/ RW and close S. Pt completed 10' on Nustep LE only for general strengthening and coordination. Trialed pt ambulating with straight cane in L and R hand, pt's balance challenged with this task but completed with MinA. Initiated home exercise program for high level balance activities including side stepping, tandem walking, single leg stance, sit<>stand with no hands, and heel walking and toe walking. Pt able to complete all tasks with MinGuard A. Pt instructed to only complete exercises with his wife providing hands on assist at his belt in case of loss of balance. Pt agreed with plan. Pt also educated on how to progress balance exercises by moving from gripping hand rail --> flat hand --> fingertips in order to find position that challenges his balance but that he can complete. Pt tolerated session well. Pt provided with handout with pictures of exercises and instructions for completing safely.    Session 2: Pt received supine in bed, agreeable to participate in therapy. Session focused on dynamic balance, hip/ankle strategies. Pt instructed in use of Biodex balance training system with weight shift activities and limit of stability activity. Pt progressed from 2 UE support to 0 UE support within easy setting to challenge balance. For several trials therapist held pt at hips and pt instructed to shift weight only  using ankle movements on dynamic surface to increase ankle musculature activation during balance. Pt responded well to this strategy. On mat table pt completed reaching and weight shift activities in tall kneeling with 1 or 0- UE support to strengthen proximal musculature for hip strategy in balance. Pt achieved half kneel position and tolerated minor alternating isometrics in this position. Instructed pt in lunging onto foam mat with 1 UE support x10 each leg. Instructed pt in 3x 1' on balance zone training system with minor alternating isometrics to challenge balance and increase ankle activation in balance. Session ended in pt's room, where pt was left seated in w/c w/ all needs within reach.    Therapy Documentation Precautions:  Precautions Precautions: Fall Restrictions Weight Bearing Restrictions: No Pain: Pain Assessment Pain Score: 0-No pain  See FIM for current functional status  Therapy/Group: Individual Therapy  Rada Hay  Rada Hay, PT, DPT 08/10/2014, 7:38 AM

## 2014-08-10 NOTE — Progress Notes (Signed)
Occupational Therapy Note  Patient Details  Name: LADISLAUS FINE MRN: [id number] Date of Birth: April 05, 1966  Today's Date: 08/10/2014 OT Individual Time: 1300-1330 OT Individual Time Calculation (min): 30 min   Pt denied pain Individual Therapy   Pt resting in bed upon arrival but easily aroused and agreeable to therapy.  Pt amb with RW to therapy gym and engaged in dynamic standing tasks including PNF exercises while standing without UE support.  Pt required steady A to complete activities.  Pt transitioned to stepping activities without UE support.  Pt required steady A to complete. Pt exhibited increased righting reactions with repetition.  Pt returned to room and returned to w/c.  Leotis Shames [facility] 08/10/2014, 2:52 PM

## 2014-08-10 NOTE — Progress Notes (Signed)
[place] PHYSICAL MEDICINE & REHABILITATION     PROGRESS NOTE    Subjective/Complaints: No new issues. Stated his son's birthday was saturday Review of Systems - Negative except left arm weak, poor coordination Objective: Vital Signs: Blood pressure 149/65, pulse 68, temperature 98.3 F (36.8 C), temperature source Oral, resp. rate 17, weight 78.4 kg (172 lb 13.5 oz), SpO2 97 %. No results found. No results for input(s): WBC, HGB, HCT, PLT in the last 72 hours.  Recent Labs  08/08/14 0724  NA 139  K 4.2  CL 104  GLUCOSE 115*  BUN 22  CREATININE 1.57*  CALCIUM 8.7   CBG (last 3)   Recent Labs  08/09/14 2204 08/10/14 0029 08/10/14 0426  GLUCAP 169* 93 122*    Wt Readings from Last 3 Encounters:  08/03/14 78.4 kg (172 lb 13.5 oz)  07/28/14 76.8 kg (169 lb 5 oz)  05/29/10 80.967 kg (178 lb 8 oz)    Physical Exam:  Constitutional: He is oriented to person, place, and time. He appears well-developed and well-nourished. comfortable HENT: oral mucosa pink and moist Head: Normocephalic and atraumatic.  Eyes: Conjunctivae are normal. Pupils are equal, round, and reactive to light.  Neck: Normal range of motion. Neck supple.  Cardiovascular: Normal rate and regular rhythm. no murmur Respiratory: Effort normal and breath sounds normal. No respiratory distress. He has no wheezes. He exhibits no tenderness.  GI: Soft. Bowel sounds are normal. He exhibits no distension. There is no tenderness.  Musculoskeletal: He exhibits no edema or tenderness.  Neurological: He is alert and oriented to person, place, and time.  Strength nearly 4+/5 to 5/5 in all 4's. Speech clear with improved volume. Follows commands without difficulty. Ataxia with left finger to nose improving. Heel shin ok but pt states he needs to concentrate more Skin: Skin is warm and dry.  Psychiatric: He has a normal mood and affect. His speech is normal and behavior is normal. Judgment and thought  content normal. Cognition and memory are normal.    Assessment/Plan: 1. Functional deficits secondary to left pontine infarct which require 3+ hours per day of interdisciplinary therapy in a comprehensive inpatient rehab setting. Physiatrist is providing close team supervision and 24 hour management of active medical problems listed below. Physiatrist and rehab team continue to assess barriers to discharge/monitor patient progress toward functional and medical goals. FIM: FIM - Bathing Bathing Steps Patient Completed: Chest, Right Arm, Left Arm, Abdomen, Front perineal area, Buttocks, Right upper leg, Left upper leg, Right lower leg (including foot), Left lower leg (including foot) Bathing: 5: Set-up assist to: Obtain items  FIM - Upper Body Dressing/Undressing Upper body dressing/undressing steps patient completed: Pull shirt over trunk, Put head through opening of pull over shirt/dress, Thread/unthread left sleeve of pullover shirt/dress, Thread/unthread right sleeve of pullover shirt/dresss Upper body dressing/undressing: 5: Set-up assist to: Obtain clothing/put away FIM - Lower Body Dressing/Undressing Lower body dressing/undressing steps patient completed: Thread/unthread right underwear leg, Thread/unthread left underwear leg, Pull underwear up/down, Thread/unthread right pants leg, Thread/unthread left pants leg, Don/Doff right shoe, Don/Doff left sock, Don/Doff right sock, Don/Doff left shoe, Pull pants up/down, Fasten/unfasten pants, Fasten/unfasten left shoe, Fasten/unfasten right shoe Lower body dressing/undressing: 4: Steadying Assist  FIM - Toileting Toileting steps completed by patient: Adjust clothing prior to toileting, Performs perineal hygiene, Adjust clothing after toileting Toileting Assistive Devices: Grab bar or rail for support Toileting: 5: Supervision: Safety issues/verbal cues  FIM - Radio producer Devices: Environmental consultant, Grab bars  Toilet  Transfers: 5-To toilet/BSC: Supervision (verbal cues/safety issues), 5-From toilet/BSC: Supervision (verbal cues/safety issues)  FIM - Control and instrumentation engineer Devices: Walker, Arm rests Bed/Chair Transfer: 5: Chair or W/C > Bed: Supervision (verbal cues/safety issues), 5: Bed > Chair or W/C: Supervision (verbal cues/safety issues)  FIM - Locomotion: Wheelchair Distance: 150 Locomotion: Wheelchair: 0: Activity did not occur FIM - Locomotion: Ambulation Locomotion: Ambulation Assistive Devices: Environmental consultant - Rolling (none) Ambulation/Gait Assistance: 5: Supervision Locomotion: Ambulation: 5: Travels 150 ft or more with supervision/safety issues  Comprehension Comprehension Mode: Auditory Comprehension: 6-Follows complex conversation/direction: With extra time/assistive device  Expression Expression Mode: Verbal Expression: 6-Expresses complex ideas: With extra time/assistive device  Social Interaction Social Interaction: 6-Interacts appropriately with others with medication or extra time (anti-anxiety, antidepressant).  Problem Solving Problem Solving: 7-Solves complex problems: Recognizes & self-corrects  Memory Memory: 7-Complete Independence: No helper  Medical Problem List and Plan: 1. Functional deficits secondary to left pontine infarct 2. DVT Prophylaxis/Anticoagulation: Pharmaceutical: Lovenox 3. Pain Management: N/A 4. Mood: Endorses some anxiety due to current illness. LCSW to follow for evaluation and support.  5. Neuropsych: This patient is capable of making decisions on his own behalf. 6. Skin/Wound Care: Routine pressure relief measures. Maintain adequate nutrition and hydration status.  7. Fluids/Electrolytes/Nutrition: Monitor I/O. Encourage po intake.    8. HTN: improved control. 9. CKD?: follow labs serially--   -outpt follow up  -follow up CMET pending today 10. Hypokalemia: supplementing.  11. Esophagitis: carafate and PPI.   12. DM type 1:  Inconsistent to poor control. (improved yesterday)  -CBG's ac/hs. Patient counting carbs and managing insulin pump with appropriate bolus  -RN to continue working with pt regarding cbg assessment/pump 13. LFT's: rising---?statin related---hold lisinopril 20mg  for now.   -follow up labs today   LOS (Days) 7 A FACE TO FACE EVALUATION WAS PERFORMED  SWARTZ,ZACHARY T 08/10/2014 7:58 AM

## 2014-08-10 NOTE — Progress Notes (Signed)
Occupational Therapy Session Note  Patient Details  Name: Adrian Neal MRN: [id number] Date of Birth: [birth year]  Today's Date: 08/10/2014 OT Individual Time: 1000-1100 OT Individual Time Calculation (min): 60 min    Short Term Goals: Week 1:  OT Short Term Goal 1 (Week 1): STGs=LTGs due to short ELOS  Skilled Therapeutic Interventions/Progress Updates:    Pt engaged in BADL retraining including bathing at shower level (ADL apartment) and dressing with sit<>stand from seat.  Pt amb with RW to ADL apartment and transferred to toilet prior to shower.  Pt able to step into tub using wall as support.  Pt completed all tasks at supervision level with min verbal cues for safety awareness.  Continued safety education and discharge planning.  Pt agreeable to OPOT upon discharge.  Focus on activity tolerance, sit<>stand, standing balance, functional amb with RW, and safety awareness.  Therapy Documentation Precautions:  Precautions Precautions: Fall Restrictions Weight Bearing Restrictions: No  Pain: Pain Assessment Pain Assessment: No/denies pain Pain Score: 0-No pain  See FIM for current functional status  Therapy/Group: Individual Therapy  Leroy Libman 08/10/2014, 12:17 PM

## 2014-08-11 ENCOUNTER — Inpatient Hospital Stay (HOSPITAL_COMMUNITY): Payer: 59

## 2014-08-11 ENCOUNTER — Inpatient Hospital Stay (HOSPITAL_COMMUNITY): Payer: 59 | Admitting: Physical Therapy

## 2014-08-11 LAB — GLUCOSE, CAPILLARY
GLUCOSE-CAPILLARY: 277 mg/dL — AB (ref 70–99)
GLUCOSE-CAPILLARY: 409 mg/dL — AB (ref 70–99)
Glucose-Capillary: 117 mg/dL — ABNORMAL HIGH (ref 70–99)
Glucose-Capillary: 128 mg/dL — ABNORMAL HIGH (ref 70–99)
Glucose-Capillary: 142 mg/dL — ABNORMAL HIGH (ref 70–99)
Glucose-Capillary: 170 mg/dL — ABNORMAL HIGH (ref 70–99)
Glucose-Capillary: 97 mg/dL (ref 70–99)

## 2014-08-11 LAB — BASIC METABOLIC PANEL
Anion gap: 8 (ref 5–15)
BUN: 30 mg/dL — AB (ref 6–23)
CALCIUM: 8.6 mg/dL (ref 8.4–10.5)
CO2: 27 mmol/L (ref 19–32)
Chloride: 101 mmol/L (ref 96–112)
Creatinine, Ser: 1.95 mg/dL — ABNORMAL HIGH (ref 0.50–1.35)
GFR calc Af Amer: 45 mL/min — ABNORMAL LOW (ref >90)
GFR calc non Af Amer: 39 mL/min — ABNORMAL LOW (ref >90)
GLUCOSE: 197 mg/dL — AB (ref 70–99)
Potassium: 4.4 mmol/L (ref 3.5–5.1)
SODIUM: 136 mmol/L (ref 135–145)

## 2014-08-11 NOTE — Progress Notes (Signed)
Occupational Therapy Note  Patient Details  Name: Adrian Neal MRN: [id number] Date of Birth: 10/05/1965  Today's Date: 08/11/2014 OT Individual Time: 1400-1430 OT Individual Time Calculation (min): 30 min   Pt denied pain Individual Therapy  Pt amb with RW from room to entrance C2 (atrium) without rest break.  Pt continues to require extra time and min verbal cues for safety when making turns.  Pt occasionally picks up RW when turning and often rotates versus turning completely around when retrieving items at an angle to body position.  Pt amb over variety of surfaces to simulate community after discharge.    Leotis Shames [facility] 08/11/2014, 2:39 PM

## 2014-08-11 NOTE — Progress Notes (Signed)
Physical Therapy Session Note  Patient Details  Name: Adrian Neal MRN: [id number] Date of Birth: 10/07/1965  Today's Date: 08/11/2014 PT Individual Time: 1110-1210 PT Individual Time Calculation (min): 60 min   Short Term Goals: Week 1:  PT Short Term Goal 1 (Week 1): STG's = LTG's due to short ELOS Week 2:= LTGs     Skilled Therapeutic Interventions/Progress Updates:  Gait on level tile with RW, supervision x 150', x 2; in congested area x 25' with supervision to simulate home env.  Simulated car transfer to sedan ht seat; supervision with RW, cues for sequence and safety.  Reviewed folding RW with pt.  Floor transfer to floor mat x 2, supervision> modified independent with 2nd trial; discussed falls safety, recovery.  10 m timed walk test = 25 seconds; 1.31'/second, decreased efficiency.  neuromuscular re-education in sitting and standing on [place] focusing on wt shift to L without use of UEs; in tall kneeling on mat during reaching activity; during 4 point quadruped> 2 point; in dynamic standing during R and L toe touch onto floor "clock" spots; and during gait with Rw, kicking yoga block with R foot    Pt left sitting EOB waiting for lunch, all needs in reach. Therapy Documentation Precautions:  Precautions Precautions: Fall Restrictions Weight Bearing Restrictions: No   Pain: Pain Assessment Pain Assessment: No/denies pain   Locomotion : Ambulation Ambulation/Gait Assistance: 5: Supervision;3: Mod assist;4: Min assist Gait Gait velocity: 25 sconds for 10TWT     See FIM for current functional status  Therapy/Group: Individual Therapy  Thadius Smisek 08/11/2014, 12:31 PM

## 2014-08-11 NOTE — Progress Notes (Signed)
Results for SARVESH, BURDGE (MRN [id number]) as of 08/11/2014 15:47  Ref. Range 08/10/2014 16:32 08/10/2014 17:38 08/10/2014 20:15 08/11/2014 00:04 08/11/2014 04:14 08/11/2014 07:57 08/11/2014 12:19  Glucose-Capillary Latest Ref Range: 70-99 mg/dL 67 (L) 111 (H) 121 (H) 117 (H) 142 (H) 128 (H) 97  Spoke with patient regarding Type 1 diabetes and insulin pump management.  Discussed that blood sugars have been lower today.  Reminded him of the effects of exercise and blood sugars.  He verbalized understanding and states that he took less insulin this morning due to exercise.  Patient has had diabetes since age 68 and also has a 50 year old daughter with Type 1 diabetes.  He is very well educated regarding diabetes management.  He states that he is in the process of trying to get approved for continuous glucose monitoring with his insulin pump.  He see's endocrinologist, Dr. Chalmers Cater.    No further needs noted at this time.  Thanks, Adah Perl, RN, BC-ADM Inpatient Diabetes Coordinator Pager [phone] (8a-5p)

## 2014-08-11 NOTE — Plan of Care (Signed)
Problem: RH KNOWLEDGE DEFICIT Goal: RH STG INCREASE KNOWLEDGE OF DIABETES Pt able to demonstrate understanding of education via teach-back method.  Outcome: Progressing Diabetes coordinator reviewed the pump and talked to the Pt.

## 2014-08-11 NOTE — Progress Notes (Signed)
Occupational Therapy Session Note  Patient Details  Name: JHAYDEN KUTCHER MRN: [id number] Date of Birth: [birth year]  Today's Date: 08/11/2014 OT Individual Time: 0800-0900 OT Individual Time Calculation (min): 60 min    Short Term Goals: Week 1:  OT Short Term Goal 1 (Week 1): STGs=LTGs due to short ELOS  Skilled Therapeutic Interventions/Progress Updates:    Pt engaged in BADL retraining including bathing at tub/shower level and dressing with sit<>stand from seat.  Pt gathered clothing and supplies prior to amb with RW to tub room for BADLs.  Pt completed all tasks with supervision.  Pt did not exhibit any unsafe behavior.  All transitional movements are deliberate and controlled.  Pt requires extra time to complete all transitional movements and standing tasks (with or without UE support).  Focus on activity tolerance, sit<>stand, standing balance, functional amb with RW, and safety awareness.  Therapy Documentation Precautions:  Precautions Precautions: Fall Restrictions Weight Bearing Restrictions: No  Pain:  Pt denied pain  See FIM for current functional status  Therapy/Group: Individual Therapy  Leroy Libman 08/11/2014, 9:01 AM

## 2014-08-11 NOTE — Patient Care Conference (Addendum)
Inpatient RehabilitationTeam Conference and Plan of Care Update Date: 08/09/2014   Time: 3:00 PM    Patient Name: Adrian Neal      Medical Record Number: [id number]  Date of Birth: 1965/09/09 Sex: Male         Room/Bed: 4M05C/4M05C-01 Payor Info: Payor: Theme park manager / Plan: Theme park manager [employer]VIGATE / Product Type: *No Product type* /    Admitting Diagnosis: CVA  Admit Date/Time:  08/03/2014  8:20 PM Admission Comments: No comment available   Primary Diagnosis:  CVA (cerebral infarction) Principal Problem: CVA (cerebral infarction)  Patient Active Problem List   Diagnosis Date Noted  . Ataxia   . CVA (cerebral infarction) 08/03/2014  . ARF (acute renal failure)   . Insulin dependent diabetes mellitus   . 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 Intractable nausea and vomiting 07/27/2014  . Vertigo 07/27/2014  . Essential hypertension 07/27/2014  . Type 1 diabetes mellitus 05/29/2010  . Hyperlipidemia 05/29/2010  . HYPERTENSION 05/29/2010  . PLANTAR FASCIITIS 05/29/2010    Expected Discharge Date: Expected Discharge Date: 08/12/14  Team Members Present: Physician leading conference: Dr. Alger Simons Social Worker Present: Lennart Pall, LCSW Nurse Present: Elliot Cousin, RN PT Present: Carney Living, PT OT Present: Roanna Epley, Griffin Basil, OT SLP Present: Weston Anna, SLP Other (Discipline and Name): Danne Baxter, RN [facility]) PPS Coordinator present : Daiva Nakayama, RN, CRRN     Current Status/Progress Goal Weekly Team Focus  Medical   left pontine infarct with left sided vestibular/cerebellar symptoms.   working on balance and equilibirum  improve righting reaction, pain mgt, bp control   Bowel/Bladder   Continent to bowel and bladder.  To continue continent to bowel and bladder.  To monitor bowel and bladder function Q shift.   Swallow/Nutrition/ Hydration             ADL's   supervision overall with occasional min  A for standing balance  mod I overall  functional transfers, standing balance, family educatoin, activity tolerance   Mobility   supervision with RW, mod-max A for balance without RW  mod I-supervision  ambulation, NMR, standing balance, activity tolerance, pt/family education   Communication             Safety/Cognition/ Behavioral Observations     To be free from falls during her stay in rehab.      Pain   No complain of pain.  To keep pain levels less than 3,on scale 1 to 10.  To monitor for pain Q 2 hrs and PRN.   Skin   Skin dry and intact.  To keep skin free of breaking down.  To monitor skin condition Q shift.    Rehab Goals Patient on target to meet rehab goals: Yes *See Care Plan and progress notes for long and short-term goals.  Barriers to Discharge: vestibular sx, diabetes control    Possible Resolutions to Barriers:  rx above    Discharge Planning/Teaching Needs:  home with wife and children providing any needed assistance      Team Discussion:  Very motivated;  Vestibular symptoms and ataxia.  Ready for d/c end of week.  Mod i goals.  No concerns  Revisions to Treatment Plan:  None   Continued Need for Acute Rehabilitation Level of Care: The patient requires daily medical management by a physician with specialized training in physical medicine and rehabilitation for the following conditions: Daily direction of a multidisciplinary physical rehabilitation program to ensure safe treatment while  eliciting the highest outcome that is of practical value to the patient.: Yes Daily medical management of patient stability for increased activity during participation in an intensive rehabilitation regime.: Yes Daily analysis of laboratory values and/or radiology reports with any subsequent need for medication adjustment of medical intervention for : Post surgical problems;Neurological problems  Chadrick Sprinkle 08/12/2014, 9:09 AM

## 2014-08-11 NOTE — Progress Notes (Signed)
Social Work Patient ID: Adrian Neal, male   DOB: 09-03-1965, 49 y.o.   MRN: [id number]   Met with pt and wife yesterday morning to review team conference.  Both aware and agreeable with target d/c tomorrow and recommendation for OP tx.  Pt very pleased with progress and happy to be going home prior to son's birthday.  Am in process of making DME and tx f/u referrals.    Kylei Purington, LCSW

## 2014-08-11 NOTE — Progress Notes (Signed)
[facility] PHYSICAL MEDICINE & REHABILITATION     PROGRESS NOTE    Subjective/Complaints: Feeling well. No new complaints Review of Systems - Negative except left arm weak, poor coordination Objective: Vital Signs: Blood pressure 142/62, pulse 72, temperature 98.5 F (36.9 C), temperature source Oral, resp. rate 18, weight 77.701 kg (171 lb 4.8 oz), SpO2 98 %. No results found. No results for input(s): WBC, HGB, HCT, PLT in the last 72 hours.  Recent Labs  08/10/14 0712  NA 138  K 4.3  CL 102  GLUCOSE 101*  BUN 23  CREATININE 1.72*  CALCIUM 9.0   CBG (last 3)   Recent Labs  08/10/14 2015 08/11/14 0004 08/11/14 0414  GLUCAP 121* 117* 142*    Wt Readings from Last 3 Encounters:  08/10/14 77.701 kg (171 lb 4.8 oz)  07/28/14 76.8 kg (169 lb 5 oz)  05/29/10 80.967 kg (178 lb 8 oz)    Physical Exam:  Constitutional: He is oriented to person, place, and time. He appears well-developed and well-nourished. comfortable HENT: oral mucosa pink and moist Head: Normocephalic and atraumatic.  Eyes: Conjunctivae are normal. Pupils are equal, round, and reactive to light.  Neck: Normal range of motion. Neck supple.  Cardiovascular: Normal rate and regular rhythm. no murmur Respiratory: Effort normal and breath sounds normal. No respiratory distress. He has no wheezes. He exhibits no tenderness.  GI: Soft. Bowel sounds are normal. He exhibits no distension. There is no tenderness.  Musculoskeletal: He exhibits no edema or tenderness.  Neurological: He is alert and oriented to person, place, and time.  Strength nearly 4+/5 to 5/5 in all 4's. Speech clear with improved volume. Follows commands without difficulty. Ataxia with left finger to nose improving. Heel shin ok but pt states he needs to concentrate more Skin: Skin is warm and dry.  Psychiatric: He has a normal mood and affect. His speech is normal and behavior is normal. Judgment and thought content normal.  Cognition and memory are normal.    Assessment/Plan: 1. Functional deficits secondary to left pontine infarct which require 3+ hours per day of interdisciplinary therapy in a comprehensive inpatient rehab setting. Physiatrist is providing close team supervision and 24 hour management of active medical problems listed below. Physiatrist and rehab team continue to assess barriers to discharge/monitor patient progress toward functional and medical goals. FIM: FIM - Bathing Bathing Steps Patient Completed: Chest, Right Arm, Left Arm, Abdomen, Front perineal area, Buttocks, Right upper leg, Left upper leg, Right lower leg (including foot), Left lower leg (including foot) Bathing: 5: Set-up assist to: Obtain items  FIM - Upper Body Dressing/Undressing Upper body dressing/undressing steps patient completed: Pull shirt over trunk, Put head through opening of pull over shirt/dress, Thread/unthread left sleeve of pullover shirt/dress, Thread/unthread right sleeve of pullover shirt/dresss Upper body dressing/undressing: 7: Complete Independence: No helper FIM - Lower Body Dressing/Undressing Lower body dressing/undressing steps patient completed: Thread/unthread right underwear leg, Thread/unthread left underwear leg, Pull underwear up/down, Thread/unthread right pants leg, Thread/unthread left pants leg, Don/Doff right shoe, Don/Doff left sock, Don/Doff right sock, Don/Doff left shoe, Pull pants up/down, Fasten/unfasten pants, Fasten/unfasten left shoe, Fasten/unfasten right shoe Lower body dressing/undressing: 5: Supervision: Safety issues/verbal cues  FIM - Toileting Toileting steps completed by patient: Adjust clothing prior to toileting, Performs perineal hygiene, Adjust clothing after toileting Toileting Assistive Devices: Grab bar or rail for support Toileting: 5: Supervision: Safety issues/verbal cues  FIM - Radio producer Devices: Environmental consultant, Product manager  Transfers: 5-To  toilet/BSC: Supervision (verbal cues/safety issues), 5-From toilet/BSC: Supervision (verbal cues/safety issues)  FIM - Control and instrumentation engineer Devices: Walker, Arm rests Bed/Chair Transfer: 5: Chair or W/C > Bed: Supervision (verbal cues/safety issues), 5: Bed > Chair or W/C: Supervision (verbal cues/safety issues)  FIM - Locomotion: Wheelchair Distance: 150 Locomotion: Wheelchair: 0: Activity did not occur FIM - Locomotion: Ambulation Locomotion: Ambulation Assistive Devices: Environmental consultant - Rolling, Journalist, newspaper, Other (comment) (hallway rail) Ambulation/Gait Assistance: 5: Supervision, 4: Min assist (MinA with cane) Locomotion: Ambulation: 4: Travels 150 ft or more with minimal assistance (Pt.>75%)  Comprehension Comprehension Mode: Auditory Comprehension: 6-Follows complex conversation/direction: With extra time/assistive device  Expression Expression Mode: Verbal Expression: 6-Expresses complex ideas: With extra time/assistive device  Social Interaction Social Interaction: 6-Interacts appropriately with others with medication or extra time (anti-anxiety, antidepressant).  Problem Solving Problem Solving: 7-Solves complex problems: Recognizes & self-corrects  Memory Memory: 7-Complete Independence: No helper  Medical Problem List and Plan: 1. Functional deficits secondary to left pontine infarct 2. DVT Prophylaxis/Anticoagulation: Pharmaceutical: Lovenox 3. Pain Management: N/A 4. Mood: Endorses some anxiety due to current illness. LCSW to follow for evaluation and support.  5. Neuropsych: This patient is capable of making decisions on his own behalf. 6. Skin/Wound Care: Routine pressure relief measures. Maintain adequate nutrition and hydration status.  7. Fluids/Electrolytes/Nutrition: Monitor I/O. Encourage po intake.    8. HTN: improved control. 9. CKD?: follow labs serially--   -outpt follow up  -follow up CMET again in  am  -encourage fluids 10. Hypokalemia: supplementing.  11. Esophagitis: carafate and PPI.  12. DM type 1:  Inconsistent to poor control. (improved yesterday)  -CBG's ac/hs. Patient counting carbs and managing insulin pump with appropriate bolus  -RN to continue working with pt regarding cbg assessment/pump 13. LFT's: rising---?statin related---hold lipitor 20mg  for now.   -LFT's a little lower yesterday. ?reactive   LOS (Days) 8 A FACE TO FACE EVALUATION WAS PERFORMED  Gohan Collister T 08/11/2014 7:44 AM

## 2014-08-11 NOTE — Progress Notes (Signed)
Physical Therapy Session Note  Patient Details  Name: Adrian Neal MRN: [id number] Date of Birth: [birth year]  Today's Date: 08/11/2014 PT Individual Time:  -      Short Term Goals: Week 1:  PT Short Term Goal 1 (Week 1): STG's = LTG's due to short ELOS  Skilled Therapeutic Interventions/Progress Updates:   Patient received in therapy gym, handoff from OT. Session focused on NMR, midline orientation, standing balance, activity tolerance. Gait with R HHA > bilateral HHA with therapist positioned in front of patient with focus on upright posture, decreasing pushing tendencies to left, and wider L step length to facilitate wider BOS with use of green line on floor for visual feedback. Patient c/o nausea and feeling as though he might vomit, RN notified and administered medication. See vitals below. Transitioned to NuStep using BLE at level 6 x 10 min for strengthening and coordination. Patient reported improvement in nausea. In stairwell, stair training up/down 22 stairs using R rail ascending, close supervision-min A with 2 LOB when ascending, verbal cues for wider L step length and ensuring foot is completely on step before stepping. Standing on foam pad, patient completed pipeline tree puzzle of increased complexity with cues to decrease UE support on rolling tray table, overall S-min A for balance. Patient ambulated back to room using RW with close supervision, verbal cues for increased B step length, wider L step, and keeping RW safe distance from body. Patient left in bathroom on toilet, verbalized understanding to call for assistance when finished.    Therapy Documentation Precautions:  Precautions Precautions: Fall Restrictions Weight Bearing Restrictions: No Vital Signs: Therapy Vitals Pulse Rate: 73 BP: 130/70 mmHg Patient Position (if appropriate): Sitting Oxygen Therapy SpO2: 100 % O2 Device: Not Delivered Pain: Pain Assessment Pain Assessment: No/denies pain  See FIM for  current functional status  Therapy/Group: Individual Therapy  Laretta Alstrom 08/11/2014, 9:28 AM

## 2014-08-12 ENCOUNTER — Inpatient Hospital Stay (HOSPITAL_COMMUNITY): Payer: 59 | Admitting: Physical Therapy

## 2014-08-12 ENCOUNTER — Inpatient Hospital Stay (HOSPITAL_COMMUNITY): Payer: 59

## 2014-08-12 DIAGNOSIS — R945 Abnormal results of liver function studies: Secondary | ICD-10-CM | POA: Diagnosis not present

## 2014-08-12 DIAGNOSIS — R7989 Other specified abnormal findings of blood chemistry: Secondary | ICD-10-CM | POA: Diagnosis not present

## 2014-08-12 LAB — COMPREHENSIVE METABOLIC PANEL
ALK PHOS: 84 U/L (ref 39–117)
ALT: 92 U/L — ABNORMAL HIGH (ref 0–53)
ANION GAP: 8 (ref 5–15)
AST: 24 U/L (ref 0–37)
Albumin: 3.1 g/dL — ABNORMAL LOW (ref 3.5–5.2)
BILIRUBIN TOTAL: 0.7 mg/dL (ref 0.3–1.2)
BUN: 28 mg/dL — AB (ref 6–23)
CHLORIDE: 102 mmol/L (ref 96–112)
CO2: 28 mmol/L (ref 19–32)
CREATININE: 1.89 mg/dL — AB (ref 0.50–1.35)
Calcium: 8.9 mg/dL (ref 8.4–10.5)
GFR calc Af Amer: 47 mL/min — ABNORMAL LOW (ref >90)
GFR calc non Af Amer: 40 mL/min — ABNORMAL LOW (ref >90)
Glucose, Bld: 87 mg/dL (ref 70–99)
Potassium: 4.6 mmol/L (ref 3.5–5.1)
Sodium: 138 mmol/L (ref 135–145)
Total Protein: 5.2 g/dL — ABNORMAL LOW (ref 6.0–8.3)

## 2014-08-12 LAB — GLUCOSE, CAPILLARY
GLUCOSE-CAPILLARY: 96 mg/dL (ref 70–99)
GLUCOSE-CAPILLARY: 117 mg/dL — AB (ref 70–99)
Glucose-Capillary: 109 mg/dL — ABNORMAL HIGH (ref 70–99)

## 2014-08-12 MED ORDER — CARVEDILOL 25 MG PO TABS: 25.0000 mg | ORAL_TABLET | Freq: Two times a day (BID) | ORAL | Status: DC

## 2014-08-12 MED ORDER — ONDANSETRON HCL 4 MG PO TABS: 4.0000 mg | ORAL_TABLET | Freq: Four times a day (QID) | ORAL | Status: DC | PRN

## 2014-08-12 MED ORDER — LISINOPRIL 2.5 MG PO TABS: 2.5000 mg | ORAL_TABLET | Freq: Every day | ORAL | Status: DC

## 2014-08-12 MED ORDER — PANTOPRAZOLE SODIUM 40 MG PO TBEC: 40.0000 mg | DELAYED_RELEASE_TABLET | Freq: Every day | ORAL | Status: DC

## 2014-08-12 MED ORDER — MECLIZINE HCL 25 MG PO TABS: 25.0000 mg | ORAL_TABLET | Freq: Three times a day (TID) | ORAL | Status: DC | PRN

## 2014-08-12 NOTE — Progress Notes (Signed)
Occupational Therapy Discharge Summary  Patient Details  Name: Azaiah J Suell MRN: [id number] Date of Birth: 12/05/1965   Patient has met 9 of 9 long term goals due to improved activity tolerance, improved balance, postural control, functional use of  RIGHT upper extremity and improved coordination.  Pt made excellent progress with BADLs and IADLs (simple meal prep) during this admission.  Pt continues to exhibit slow and deliberate movement patters with all transitional movements but does not exhibit any unsafe behaviors. Pt's wife has been present during therapy.  Pt is mod I for BADLs and IADLs.  Patient to discharge at overall Modified Independent level.  Patient's care partner is independent to provide the necessary physical assistance at discharge.      Recommendation:  Patient will benefit from ongoing skilled OT services in outpatient setting to continue to advance functional skills in the area of BADL and iADL.  Equipment: No equipment providedPt to borrow BSC to use as seat in tub at home  Reasons for discharge: treatment goals met and discharge from hospital  Patient/family agrees with progress made and goals achieved: Yes  OT Discharge Vision/Perception  Vision- History Baseline Vision/History: Wears glasses;Retinopathy Wears Glasses: At all times Patient Visual Report: No change from baseline Vision- Assessment Vision Assessment?: Yes Eye Alignment: Impaired (comment) (right eye diverted inward) Ocular Range of Motion: Within Functional Limits Alignment/Gaze Preference: Within Defined Limits Tracking/Visual Pursuits: Decreased smoothness of horizontal tracking;Decreased smoothness of vertical tracking Convergence: Within functional limits Visual Fields: No apparent deficits  Cognition Overall Cognitive Status: Within Functional Limits for tasks assessed Arousal/Alertness: Awake/alert Orientation Level: Oriented X4 Attention: Selective Selective Attention:  Appears intact Memory: Appears intact Awareness: Appears intact Problem Solving: Appears intact Safety/Judgment: Appears intact Sensation Sensation Light Touch: Appears Intact Stereognosis: Appears Intact Hot/Cold: Appears Intact Proprioception: Appears Intact Coordination Gross Motor Movements are Fluid and Coordinated: No Fine Motor Movements are Fluid and Coordinated: No Coordination and Movement Description: ataxic LLE with inconsistent L foot placement Finger Nose Finger Test: slightly slower and required more concentration with left UE than right Heel Shin Test: WFL Motor  Motor Motor: Ataxia;Abnormal postural alignment and control Motor - Discharge Observations: ataxic LLE, lateral lean to left in standing Mobility  Bed Mobility Bed Mobility: Sit to Supine;Supine to Sit Supine to Sit: 7: Independent Sit to Supine: 7: Independent Transfers Sit to Stand: 6: Modified independent (Device/Increase time);With upper extremity assist;From chair/3-in-1 Stand to Sit: 6: Modified independent (Device/Increase time);With upper extremity assist;To chair/3-in-1  Trunk/Postural Assessment  Cervical Assessment Cervical Assessment: Within Functional Limits Thoracic Assessment Thoracic Assessment: Within Functional Limits Lumbar Assessment Lumbar Assessment: Within Functional Limits Postural Control Postural Control: Deficits on evaluation Righting Reactions: delayed Protective Responses: delayed  Extremity/Trunk Assessment RUE Assessment RUE Assessment: Within Functional Limits LUE Assessment LUE Assessment: Within Functional Limits  See FIM for current functional status  Lanier, Thomas Chappell 08/12/2014, 11:18 AM  

## 2014-08-12 NOTE — Progress Notes (Signed)
Pt's CBG was taken at 0815. CBG was 409. RN called MD on call. Stat BMP was ordered. Repeat blood glucose at 2330 was 170. Will continue to monitor blood sugar.

## 2014-08-12 NOTE — Progress Notes (Signed)
Physical Therapy Discharge Summary  Patient Details  Name: Adrian Neal MRN: [id number] Date of Birth: 1965-11-14  Today's Date: 08/12/2014 PT Individual Time: 1100-1155 PT Individual Time Calculation (min): 55 min    Patient has met 10 of 10 long term goals due to improved activity tolerance, improved balance, improved postural control, increased strength, ability to compensate for deficits, functional use of  left upper extremity and left lower extremity, improved awareness and improved coordination.  Patient to discharge at an ambulatory level Modified Independent.   Patient's care partner unavailable to provide the necessary physical assistance at discharge.  Reasons goals not met: NA  Recommendation:  Patient will benefit from ongoing skilled PT services in outpatient setting to continue to advance safe functional mobility, address ongoing impairments in standing balance, balance strategies, coordination, timing and sequencing, endurance, and minimize fall risk.  Equipment: RW  Reasons for discharge: treatment goals met and discharge from hospital  Patient/family agrees with progress made and goals achieved: Yes  PT Discharge Patient at overall supervision-mod I level using RW. Wife not available for family training but patient able to direct care for assistance as needed. Patient provided with HEP for falls prevention, balance, and general strengthening. See below for further discharge details. Patient demonstrated improvement to 38/56 from 24/56 on evaluation for [facility] Scale. Patient with no further questions regarding discharge.   Skilled Therapeutic Intervention Precautions/Restrictions Restrictions Weight Bearing Restrictions: No Pain Pain Assessment Pain Assessment: No/denies pain Vision/Perception  Vision - Assessment Eye Alignment: Impaired (comment) (right eye diverted inward) Ocular Range of Motion: Within Functional Limits Alignment/Gaze Preference:  Within Defined Limits Tracking/Visual Pursuits: Decreased smoothness of horizontal tracking;Decreased smoothness of vertical tracking Convergence: Within functional limits  Cognition Overall Cognitive Status: Within Functional Limits for tasks assessed Arousal/Alertness: Awake/alert Orientation Level: Oriented X4 Attention: Selective Selective Attention: Appears intact Memory: Appears intact Awareness: Appears intact Problem Solving: Appears intact Safety/Judgment: Appears intact Sensation Sensation Light Touch: Appears Intact Stereognosis: Appears Intact Hot/Cold: Appears Intact Proprioception: Appears Intact Coordination Gross Motor Movements are Fluid and Coordinated: No Fine Motor Movements are Fluid and Coordinated: No Coordination and Movement Description: ataxic LLE with inconsistent L foot placement Finger Nose Finger Test: slightly slower and required more concentration with left UE than right Heel Shin Test: [facility] Motor  Motor Motor: Ataxia;Abnormal postural alignment and control Motor - Discharge Observations: ataxic LLE, lateral lean to left in standing  Mobility Bed Mobility Bed Mobility: Sit to Supine;Supine to Sit Supine to Sit: 7: Independent Sit to Supine: 7: Independent Transfers Transfers: Yes Sit to Stand: 6: Modified independent (Device/Increase time);With upper extremity assist;From chair/3-in-1 Stand to Sit: 6: Modified independent (Device/Increase time);With upper extremity assist;To chair/3-in-1 Stand Pivot Transfers: 6: Modified independent (Device/Increase time) Locomotion  Ambulation Ambulation/Gait Assistance: 6: Modified independent (Device/Increase time) Ambulation Distance (Feet): 200 Feet Assistive device: Rolling walker Gait Gait: Yes Gait Pattern: Impaired Gait Pattern: Step-through pattern;Decreased stride length;Decreased weight shift to right;Left genu recurvatum;Narrow base of support Gait velocity: 10 MWT = 0.59 m/s using  RW Stairs / Additional Locomotion Stairs: Yes Stairs Assistance: 5: Supervision Stair Management Technique: One rail Right;Step to pattern;Forwards Number of Stairs: 12 Height of Stairs: 6 Curb: 5: Building services engineer Mobility: No (pt ambulatory)  Trunk/Postural Assessment  Cervical Assessment Cervical Assessment: Within Functional Limits Thoracic Assessment Thoracic Assessment: Within Functional Limits Lumbar Assessment Lumbar Assessment: Within Functional Limits Postural Control Postural Control: Deficits on evaluation Righting Reactions: delayed Protective Responses: delayed  Balance Balance Balance Assessed: Yes Standardized Balance  Assessment Standardized Balance Assessment: Berg Balance Test;Timed Up and Go Test Berg Balance Test Sit to Stand: Able to stand without using hands and stabilize independently Standing Unsupported: Able to stand safely 2 minutes Sitting with Back Unsupported but Feet Supported on Floor or Stool: Able to sit safely and securely 2 minutes Stand to Sit: Sits safely with minimal use of hands Transfers: Able to transfer with verbal cueing and /or supervision Standing Unsupported with Eyes Closed: Able to stand 10 seconds safely Standing Ubsupported with Feet Together: Able to place feet together independently and stand for 1 minute with supervision From Standing, Reach Forward with Outstretched Arm: Can reach forward >12 cm safely (5") From Standing Position, Pick up Object from Floor: Able to pick up shoe, needs supervision From Standing Position, Turn to Look Behind Over each Shoulder: Looks behind from both sides and weight shifts well Turn 360 Degrees: Needs close supervision or verbal cueing Standing Unsupported, Alternately Place Feet on Step/Stool: Able to complete >2 steps/needs minimal assist Standing Unsupported, One Foot in Front: Loses balance while stepping or standing Standing on One Leg: Tries to lift  leg/unable to hold 3 seconds but remains standing independently Total Score: 38 Timed Up and Go Test TUG: Normal TUG Normal TUG (seconds): 18.3 (avg of 3 trials using RW)  Five times Sit to Stand Test (FTSS) Method: Use a straight back chair with a solid seat that is 16-18" high. Ask participant to sit on the chair with arms folded across their chest.   Instructions: "Stand up and sit down as quickly as possible 5 times, keeping your arms folded across your chest."   Measurement: Stop timing when the participant stands the 5th time.  TIME: ___14___ (in seconds)  Times > 13.6 seconds is associated with increased disability and morbidity (Guralnik, 2000) Times > 15 seconds is predictive of recurrent falls in healthy individuals aged 86 and older (Buatois, et al., 2008) Normal performance values in community dwelling individuals aged 59 and older (Bohannon, 2006): o 60-69 years: 11.4 seconds o 70-79 years: 12.6 seconds o 80-89 years: 14.8 seconds  MCID: ? 2.3 seconds for Vestibular Disorders (Meretta, 2006)  Extremity Assessment  RUE Assessment RUE Assessment: Within Functional Limits LUE Assessment LUE Assessment: Within Functional Limits RLE Assessment RLE Assessment: Within Functional Limits (5/5) LLE Assessment LLE Assessment: Within Functional Limits (5/5)  See FIM for current functional status  Laretta Alstrom 08/12/2014, 11:44 AM

## 2014-08-12 NOTE — Progress Notes (Signed)
Occupational Therapy Session Note  Patient Details  Name: [name] MRN: [id number] Date of Birth: [birth year]  Today's Date: 08/12/2014 OT Individual Time: 0800-0900 OT Individual Time Calculation (min): 60 min    Short Term Goals: Week 1:  OT Short Term Goal 1 (Week 1): STGs=LTGs due to short ELOS  Skilled Therapeutic Interventions/Progress Updates:    Pt engaged in BADL retraining including bathing in tub/shower (tub room) and dressing with sit<>stand from seat.  Pt gathered clothing and supplies prior to amb with RW to tub room.  Pt completed all tasks at mod I/I level with no unsafe behaviors.  Pt transitioned to ADL kitchen and prepared a simple breakfast item at RW level with mod I.  Pt returned to room and remained seated awaiting next therapy.  Pt pleased with progress and ready to discharge home later in the day.  Therapy Documentation Precautions:  Precautions Precautions: Fall Restrictions Weight Bearing Restrictions: No Pain: Pain Assessment Pain Assessment: No/denies pain  See FIM for current functional status  Therapy/Group: Individual Therapy  Leroy Libman 08/12/2014, 11:11 AM

## 2014-08-12 NOTE — Discharge Instructions (Signed)
Inpatient Rehab Discharge Instructions  Adrian Neal Discharge date and time:  08/12/14  Activities/Precautions/ Functional Status: Activity: activity as tolerated Diet: cardiac diet and diabetic diet Drink plenty of water Wound Care: none needed   Functional status:  ___ No restrictions     ___ Walk up steps independently ___ 24/7 supervision/assistance   ___ Walk up steps with assistance _X__ Intermittent supervision/assistance  ___ Bathe/dress independently _X__ Walk with walker    ___ Bathe/dress with assistance ___ Walk Independently    ___ Shower independently ___ Walk with assistance    ___ Shower with assistance _X__ No alcohol     ___ Return to work/school ________      COMMUNITY REFERRALS UPON DISCHARGE:    Outpatient: PT     OT                 Agency: [facility] Phone: [phone]               Appointment Date/Time: 4/19 @ 10:15 am (arrive @ 9:45)                                                                4/20 @ 11:00 am   Medical Equipment/Items Ordered: rolling walker                                                     Agency/Supplier: [place] @ [phone]   GENERAL COMMUNITY RESOURCES FOR PATIENT/FAMILY:  Support Groups: Stroke Group (handout)       Special Instructions: 1. Drink plenty of fluids.  2. Take time with positional changes to avoid dizziness.   STROKE/TIA DISCHARGE INSTRUCTIONS SMOKING Cigarette smoking nearly doubles your risk of having a stroke & is the single most alterable risk factor  If you smoke or have smoked in the last 12 months, you are advised to quit smoking for your health.  Most of the excess cardiovascular risk related to smoking disappears within a year of stopping.  Ask you doctor about anti-smoking medications  [place] Quit Line: 1-800-QUIT NOW  Free Smoking Cessation Classes [phone]  CHOLESTEROL Know your levels; limit fat & cholesterol in your diet  Lipid Panel     Component  Value Date/Time   CHOL 134 07/28/2014 1036   TRIG 66 07/28/2014 1036   HDL 51 07/28/2014 1036   CHOLHDL 2.6 07/28/2014 1036   VLDL 13 07/28/2014 1036   LDLCALC 70 07/28/2014 1036      Many patients benefit from treatment even if their cholesterol is at goal.  Goal: Total Cholesterol (CHOL) less than 160  Goal:  Triglycerides (TRIG) less than 150  Goal:  HDL greater than 40  Goal:  LDL (LDLCALC) less than 100   BLOOD PRESSURE American Stroke Association blood pressure target is less that 120/80 mm/Hg  Your discharge blood pressure is:  BP: (!) 142/68 mmHg  Monitor your blood pressure  Limit your salt and alcohol intake  Many individuals will require more than one medication for high blood pressure  DIABETES (A1c is a blood sugar average for last 3 months) Goal HGBA1c is  under 7% (HBGA1c is blood sugar average for last 3 months)  Diabetes:     Lab Results  Component Value Date   HGBA1C 8.1* 07/28/2014     Your HGBA1c can be lowered with medications, healthy diet, and exercise.  Check your blood sugar as directed by your physician  Call your physician if you experience unexplained or low blood sugars.  PHYSICAL ACTIVITY/REHABILITATION Goal is 30 minutes at least 4 days per week  Activity: No driving, Therapies:  See above Return to work: to be decided on follow up.   Activity decreases your risk of heart attack and stroke and makes your heart stronger.  It helps control your weight and blood pressure; helps you relax and can improve your mood.  Participate in a regular exercise program.  Talk with your doctor about the best form of exercise for you (dancing, walking, swimming, cycling).  DIET/WEIGHT Goal is to maintain a healthy weight  Your discharge diet is: Diet heart healthy/carb modified. Fluid consistency:: Thin  liquids Your height is:  5'8" Your current weight is: Weight: 77.701 kg (171 lb 4.8 oz) Your Body Mass Index (BMI) is:  25.8  Following the type of  diet specifically designed for you will help prevent another stroke.  Your goal weight is:  164 lbs  Your goal Body Mass Index (BMI) is 19-24.  Healthy food habits can help reduce 3 risk factors for stroke:  High cholesterol, hypertension, and excess weight.  RESOURCES Stroke/Support Group:  Call [phone]   STROKE EDUCATION PROVIDED/REVIEWED AND GIVEN TO PATIENT Stroke warning signs and symptoms How to activate emergency medical system (call 911). Medications prescribed at discharge. Need for follow-up after discharge. Personal risk factors for stroke. Pneumonia vaccine given:  Flu vaccine given:  My questions have been answered, the writing is legible, and I understand these instructions.  I will adhere to these goals & educational materials that have been provided to me after my discharge from the hospital.      My questions have been answered and I understand these instructions. I will adhere to these goals and the provided educational materials after my discharge from the hospital.  Patient/Caregiver Signature _______________________________ Date __________  Clinician Signature _______________________________________ Date __________  Please bring this form and your medication list with you to all your follow-up doctor's appointments.

## 2014-08-12 NOTE — Progress Notes (Signed)
Social Work  Discharge Note  The overall goal for the admission was met for:   Discharge location: Yes - home with wife and children  Length of Stay: Yes - 11 days  Discharge activity level: Yes - supervision/ modified independent  Home/community participation: Yes  Services provided included: MD, RD, PT, OT, SLP, RN, TR, Pharmacy and [place]: Private Insurance: [facility]  Follow-up services arranged: Outpatient: PT, OT via [facility], DME: rolling walker via [place] and Patient/Family has no preference for HH/DME agencies  Comments (or additional information):  Patient/Family verbalized understanding of follow-up arrangements: Yes  Individual responsible for coordination of the follow-up plan: pt  Confirmed correct DME delivered: Jeanelle Dake 08/12/2014    Jonathon Tan

## 2014-08-12 NOTE — Discharge Summary (Signed)
Physician Discharge Summary  Patient ID: Adrian Neal MRN: [id number] DOB/AGE: 1966-01-18 49 y.o.  Admit date: 08/03/2014 Discharge date: 08/12/2014  Discharge Diagnoses:  Principal Problem:   CVA (cerebral infarction) Active Problems:   Type 1 diabetes mellitus   Benign essential HTN   ARF (acute renal failure)   Ataxia   Abnormal LFTs   Discharged Condition: Stable   Labs:  Basic Metabolic Panel:  Recent Labs Lab 08/08/14 0724 08/10/14 0712 08/11/14 2016 08/12/14 0652  NA 139 138 136 138  K 4.2 4.3 4.4 4.6  CL 104 102 101 102  CO2 23 27 27 28   GLUCOSE 115* 101* 197* 87  BUN 22 23 30* 28*  CREATININE 1.57* 1.72* 1.95* 1.89*  CALCIUM 8.7 9.0 8.6 8.9    CBC: CBC Latest Ref Rng 08/04/2014 08/02/2014 08/01/2014  WBC 4.0 - 10.5 K/uL 8.3 6.0 5.5  Hemoglobin 13.0 - 17.0 g/dL 14.0 13.5 14.2  Hematocrit 39.0 - 52.0 % 40.7 39.6 41.1  Platelets 150 - 400 K/uL 138(L) 105(L) 111(L)     CBG:  Recent Labs Lab 08/11/14 1620 08/11/14 1954 08/11/14 2342 08/12/14 0408 08/12/14 0757  GLUCAP 277* 409* 170* 96 109*    Brief HPI:   Adrian Neal is a 49 y.o. male with history of DM type 1 with diabetic retinopathy and HTN who was admitted on 07/27/14 with complaints of dizziness, nausea and difficulty walking. MRI of brain done revealing acute left pontine infarct and  Dr. Erlinda Hong recommended ASA for secondary stroke prevention.  Patient had had worsening of renal status and renal ultrasound showed normal echogenicity. Renal status improved with hydration and intractable nausea and vomiting was improving with IV Reglan. CIR was recommended by MD/ rehab team for follow up therapy.     Hospital Course: Adrian Neal was admitted to rehab 08/03/2014 for inpatient therapies to consist of PT and OT at least three hours five days a week. Past admission physiatrist, therapy team and rehab RN have worked together to provide customized collaborative inpatient rehab.  Dizziness has  resolved and po intake has improved back to baseline. Reglan was weaned off.  He has had issues maintaining adequate hydration and has required encouragement to push po fluids. Blood pressures have been well controlled. Diabetes has been monitored with ac/hs checks and has been reasonably controlled. He did have rise in BS to 400 due to running out of his insulin supply for a few hours. He was able to bolus himself appropriately once insulin resumed and BS normalized without difficulty.  He has shown good motivation as well as good safety awareness during his rehab stay.   He is modified independent at discharge and will continue to receive follow up outpatient PT    Rehab course: During patient's stay in rehab weekly team conferences were held to monitor patient's progress, set goals and discuss barriers to discharge. At admission, he required moderate assistance with mobility and min assist with basic self care skills. He  has had improvement in activity tolerance, balance, postural control, as well as ability to compensate for deficits. He continues to exhibit slow and deliberate movements with all transitional movements but no unsafe behaviors noted. He is able to complete ADL tasks at modified independent level. He is ambulating at 27' with RW at modified independent level.r     Disposition: Home   Diet: Diabetic diet.   Special Instructions: 1. Drink plenty of fluids.     Medication List    ASK your  doctor about these medications        amLODipine 10 MG tablet  Commonly known as:  NORVASC  Take 1 tablet (10 mg total) by mouth daily.     aspirin 325 MG EC tablet  Take 1 tablet (325 mg total) by mouth daily.     carvedilol 25 MG tablet  Commonly known as:  COREG  Take 1 tablet (25 mg total) by mouth 2 (two) times daily with a meal.     insulin pump Soln  Inject 0-2 each into the skin every 4 (four) hours.     meclizine 25 MG tablet  Commonly known as:  ANTIVERT  Take 1 tablet  (25 mg total) by mouth 3 (three) times daily as needed for dizziness.     omega-3 acid ethyl esters 1 G capsule  Commonly known as:  LOVAZA  Take 1 g by mouth 2 (two) times daily.     ramipril 10 MG capsule  Commonly known as:  ALTACE  Take 10 mg by mouth daily.     simvastatin 40 MG tablet  Commonly known as:  ZOCOR  Take 40 mg by mouth daily.       Follow-up Information    Follow up with Meredith Staggers, MD On 09/19/2014.   Specialty:  Physical Medicine and Rehabilitation   Why:  Be there at 11 am  for 11;30 am appointment   Contact information:   [place], [address] [address] [phone]       Follow up with Xu,Jindong, MD. Call today.   Specialty:  Neurology   Why:  for follow up appointment in 2-3 weeks.    Contact information:   [address] [place] [address] [phone]       Follow up with Dwan Bolt, MD On 08/17/2014.   Specialty:  Endocrinology   Why:  @ 1:30 pm   Contact information:   [address] [place] [address] [phone]       Signed: Bary Leriche 08/12/2014, 9:28 AM

## 2014-08-12 NOTE — Progress Notes (Signed)
[place] PHYSICAL MEDICINE & REHABILITATION     PROGRESS NOTE    Subjective/Complaints: Sugars up last night---schedule/mgt not what he's used to at home. Denies any issues this morning Review of Systems - Negative except left arm weak, poor coordination Objective: Vital Signs: Blood pressure 147/59, pulse 82, temperature 98.4 F (36.9 C), temperature source Oral, resp. rate 18, weight 77.701 kg (171 lb 4.8 oz), SpO2 97 %. No results found. No results for input(s): WBC, HGB, HCT, PLT in the last 72 hours.  Recent Labs  08/10/14 0712 08/11/14 2016  NA 138 136  K 4.3 4.4  CL 102 101  GLUCOSE 101* 197*  BUN 23 30*  CREATININE 1.72* 1.95*  CALCIUM 9.0 8.6   CBG (last 3)   Recent Labs  08/11/14 1954 08/11/14 2342 08/12/14 0408  GLUCAP 409* 170* 96    Wt Readings from Last 3 Encounters:  08/10/14 77.701 kg (171 lb 4.8 oz)  07/28/14 76.8 kg (169 lb 5 oz)  05/29/10 80.967 kg (178 lb 8 oz)    Physical Exam:  Constitutional: He is oriented to person, place, and time. He appears well-developed and well-nourished. comfortable HENT: oral mucosa pink and moist Head: Normocephalic and atraumatic.  Eyes: Conjunctivae are normal. Pupils are equal, round, and reactive to light.  Neck: Normal range of motion. Neck supple.  Cardiovascular: Normal rate and regular rhythm. no murmur Respiratory: Effort normal and breath sounds normal. No respiratory distress. He has no wheezes. He exhibits no tenderness.  GI: Soft. Bowel sounds are normal. He exhibits no distension. There is no tenderness.  Musculoskeletal: He exhibits no edema or tenderness.  Neurological: He is alert and oriented to person, place, and time.  Strength nearly 4+/5 to 5/5 in all 4's. Speech clear with improved volume. Follows commands without difficulty. Ataxia with left finger to nose improving. Heel shin ok but pt states he needs to concentrate more Skin: Skin is warm and dry.  Psychiatric: He has a  normal mood and affect. His speech is normal and behavior is normal. Judgment and thought content normal. Cognition and memory are normal.    Assessment/Plan: 1. Functional deficits secondary to left pontine infarct which require 3+ hours per day of interdisciplinary therapy in a comprehensive inpatient rehab setting. Physiatrist is providing close team supervision and 24 hour management of active medical problems listed below. Physiatrist and rehab team continue to assess barriers to discharge/monitor patient progress toward functional and medical goals. FIM: FIM - Bathing Bathing Steps Patient Completed: Chest, Right Arm, Left Arm, Abdomen, Front perineal area, Buttocks, Right upper leg, Left upper leg, Right lower leg (including foot), Left lower leg (including foot) Bathing: 5: Supervision: Safety issues/verbal cues  FIM - Upper Body Dressing/Undressing Upper body dressing/undressing steps patient completed: Pull shirt over trunk, Put head through opening of pull over shirt/dress, Thread/unthread left sleeve of pullover shirt/dress, Thread/unthread right sleeve of pullover shirt/dresss Upper body dressing/undressing: 7: Complete Independence: No helper FIM - Lower Body Dressing/Undressing Lower body dressing/undressing steps patient completed: Thread/unthread right underwear leg, Thread/unthread left underwear leg, Pull underwear up/down, Thread/unthread right pants leg, Thread/unthread left pants leg, Don/Doff right shoe, Don/Doff left sock, Don/Doff right sock, Don/Doff left shoe, Pull pants up/down, Fasten/unfasten pants, Fasten/unfasten left shoe, Fasten/unfasten right shoe Lower body dressing/undressing: 5: Supervision: Safety issues/verbal cues  FIM - Toileting Toileting steps completed by patient: Adjust clothing prior to toileting, Performs perineal hygiene, Adjust clothing after toileting Toileting Assistive Devices: Grab bar or rail for support Toileting: 5: Supervision:  Safety  issues/verbal cues  FIM - Radio producer Devices: Environmental consultant, Product manager Transfers: 5-To toilet/BSC: Supervision (verbal cues/safety issues), 5-From toilet/BSC: Supervision (verbal cues/safety issues)  FIM - Control and instrumentation engineer Devices: Walker, Arm rests Bed/Chair Transfer: 5: Chair or W/C > Bed: Supervision (verbal cues/safety issues), 5: Bed > Chair or W/C: Supervision (verbal cues/safety issues), 6: Supine > Sit: No assist, 6: Sit > Supine: No assist  FIM - Locomotion: Wheelchair Distance: 150 Locomotion: Wheelchair: 0: Activity did not occur FIM - Locomotion: Ambulation Locomotion: Ambulation Assistive Devices: Administrator (B HHA) Ambulation/Gait Assistance: 5: Supervision, 3: Mod assist, 4: Min assist Locomotion: Ambulation: 3: Travels 150 ft or more with moderate assistance (Pt: 50 - 74%)  Comprehension Comprehension Mode: Auditory Comprehension: 6-Follows complex conversation/direction: With extra time/assistive device  Expression Expression Mode: Verbal Expression: 6-Expresses complex ideas: With extra time/assistive device  Social Interaction Social Interaction: 6-Interacts appropriately with others with medication or extra time (anti-anxiety, antidepressant).  Problem Solving Problem Solving: 7-Solves complex problems: Recognizes & self-corrects  Memory Memory: 7-Complete Independence: No helper  Medical Problem List and Plan: 1. Functional deficits secondary to left pontine infarct 2. DVT Prophylaxis/Anticoagulation: Pharmaceutical: Lovenox 3. Pain Management: N/A 4. Mood: Endorses some anxiety due to current illness. LCSW to follow for evaluation and support.  5. Neuropsych: This patient is capable of making decisions on his own behalf. 6. Skin/Wound Care: Routine pressure relief measures. Maintain adequate nutrition and hydration status.  7. Fluids/Electrolytes/Nutrition: Monitor I/O. Encourage  po intake.    8. HTN: improved control. 9. CKD?: follow labs serially--   -outpt follow up  -follow up CMET today before dc  -encourage fluids--stressed heavily today 10. Hypokalemia: supplementing.  11. Esophagitis: carafate and PPI.  12. DM type 1:  Inconsistent to poor control---pt feels he will do better at home. Stressed importance of tight control  -CBG's ac/hs. Patient counting carbs and managing insulin pump at home 13. LFT's: rising---?statin related---hold lipitor 20mg  for now.   -LFT's a little lower yesterday. ?reactive'  -labs pending today   LOS (Days) 9 A FACE TO FACE EVALUATION WAS PERFORMED  Kaelin Bonelli T 08/12/2014 7:58 AM

## 2014-08-12 NOTE — Progress Notes (Signed)
Late entry: Pt discharged home with family. Discharge instructions provided by Algis Liming, PA. All questions answered. Pt verbalized understanding. Pt escorted off unit in w/c with personal belonging by [place], [place].

## 2014-08-16 ENCOUNTER — Ambulatory Visit: Payer: 59 | Attending: Physical Medicine & Rehabilitation | Admitting: Occupational Therapy

## 2014-08-16 ENCOUNTER — Encounter: Payer: Self-pay | Admitting: Occupational Therapy

## 2014-08-16 DIAGNOSIS — R269 Unspecified abnormalities of gait and mobility: Secondary | ICD-10-CM | POA: Insufficient documentation

## 2014-08-16 DIAGNOSIS — Z7409 Other reduced mobility: Secondary | ICD-10-CM | POA: Insufficient documentation

## 2014-08-16 DIAGNOSIS — I69319 Unspecified symptoms and signs involving cognitive functions following cerebral infarction: Secondary | ICD-10-CM

## 2014-08-16 DIAGNOSIS — R531 Weakness: Secondary | ICD-10-CM

## 2014-08-16 DIAGNOSIS — H8149 Vertigo of central origin, unspecified ear: Secondary | ICD-10-CM | POA: Diagnosis not present

## 2014-08-16 DIAGNOSIS — I6931 Cognitive deficits following cerebral infarction: Secondary | ICD-10-CM | POA: Diagnosis not present

## 2014-08-16 DIAGNOSIS — IMO0002 Reserved for concepts with insufficient information to code with codable children: Secondary | ICD-10-CM

## 2014-08-16 DIAGNOSIS — R279 Unspecified lack of coordination: Secondary | ICD-10-CM | POA: Insufficient documentation

## 2014-08-16 DIAGNOSIS — I6980 Unspecified sequelae of other cerebrovascular disease: Secondary | ICD-10-CM | POA: Diagnosis present

## 2014-08-16 DIAGNOSIS — R6889 Other general symptoms and signs: Secondary | ICD-10-CM

## 2014-08-16 NOTE — Therapy (Signed)
[facility] [address] [place], [place], [address] Phone: [phone]   Fax:  [phone]  Occupational Therapy Evaluation  Patient Details  Name: Adrian Neal MRN: [id number] Date of Birth: August 24, 1965 Referring Provider:  Anda Kraft, MD  Encounter Date: 08/16/2014      OT End of Session - 08/16/14 1419    Visit Number 1   Number of Visits 17   Date for OT Re-Evaluation 10/15/14   Authorization Type UHC   OT Start Time 1015   OT Stop Time 1105   OT Time Calculation (min) 50 min   Activity Tolerance Patient tolerated treatment well      Past Medical History  Diagnosis Date  . Hypertension   . Diabetes mellitus without complication     diagnosed at age 27  . 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 Eye surgery  1990    for retinopathy   . Cataract extraction w/ intraocular lens implant  1994    There were no vitals filed for this visit.  Visit Diagnosis:  Lack of coordination due to stroke - Plan: Ot plan of care cert/re-cert  Decreased strength, endurance, and mobility - Plan: Ot plan of care cert/re-cert  Cognitive deficits following cerebral infarction - Plan: Ot plan of care cert/re-cert      Subjective Assessment - 08/16/14 1026    Subjective  The stroke affected my non dominant side   Patient is accompained by: Family member   Patient Stated Goals To get my balance back and more efficiency with self care needs   Currently in Pain? No/denies           [facility] OT Assessment - 08/16/14 1029    Assessment   Diagnosis CVA with Lt hemiparesis   Onset Date 07/27/14  with admission to ED   Prior Therapy Inpatient rehab 4/6 - 08/12/14.    Precautions   Precautions Fall  no driving, no working   Balance Screen   Has the patient fallen in the past 6 months No   Has the patient had a decrease in activity level  because of a fear of falling?  No   Is the patient reluctant to leave their home because of a fear of falling?  No   Home  Environment   Family/patient expects to be discharged to: Private residence   Living Arrangements Spouse/significant other  and children   Available Help at Discharge Family   Type of [place] Two level   Bathroom Shower/Tub Tub/Shower unit;Curtain   [place] - 2 wheels;[place] - single point   Lives With Spouse  and children with 10 STE, bedroom on 2nd floor   Prior Function   Level of Independence Independent with basic ADLs;Independent with homemaking with ambulation  and driving   Vocation Full time employment   Education officer, community   ADL   ADL comments Pt independent with BADLS except washing back and tub transfers. Pt walking with FWW. Wife always performed cooking. Dependent for cleaning (taking out trash, vacuuming, etc).    Written Expression   Dominant Hand Right   Handwriting 100% legible   Vision - History   Baseline Vision Wears glasses all the time   Visual History Corrective eye surgery  Rt eye secondary to diabetic retinopathy   Additional Comments denies  change since CVA   Cognition   Overall Cognitive Status Cognition to be further assessed in functional context PRN  mental subtractions by 7's with 2 errors, delayed recall 3/3   Sensation   Additional Comments denies change   Coordination   Finger Nose Finger Test impaired when using LUE, intact with RUE   9 Hole Peg Test Right;Left   Right 9 Hole Peg Test 21.59 SEC   Left 9 Hole Peg Test 30.81 SEC. (with questionable apraxia vs. dysmetria)   Box and Blocks Rt = 53, Lt = 36   Coordination decreased gross and fine motor coordination   ROM / Strength   AROM / PROM / Strength AROM;Strength   AROM   Overall AROM Comments BUE AROM WFL's   Strength   Overall Strength Comments BUE MMT grossly 5/5, however pt reports fatigue  after repetitive tasks LUE   Hand Function   Right Hand Grip (lbs) 92 LBS   Left Hand Grip (lbs) 85 LBS                           OT Short Term Goals - 08/16/14 1424    OT SHORT TERM GOAL #1   Title Independent w/ HEP for coordination (due 09/15/14)   Time 4   Period Weeks   Status New   OT SHORT TERM GOAL #2   Title Pt to simulate tub transfer with tub transfer bench safely and verbalize understanding of acquistion of DME   Time 4   Period Weeks   Status New   OT SHORT TERM GOAL #3   Title Pt to demo sufficient strength and endurance LUE to retrieve/replace 5 lb. object from overhead shelf x 10 reps without rest   Time 4   Period Weeks   Status New   OT SHORT TERM GOAL #4   Title Pt to perform complex money exchange with 90% or higher accuracy in prep for work related tasks   Time 4   Period Weeks   Status New   OT SHORT TERM GOAL #5   Title Improve LUE functional use as evidenced by performing 42 or greater blocks on Box & Blocks test   Baseline 36   Time 4   Period Weeks   Status New           OT Long Term Goals - 08/16/14 1427    OT LONG TERM GOAL #1   Title Improve coordination as evidenced by performing 9 hole peg test in 25 sec. or under (due 10/15/14)   Baseline 30.81 sec.    Time 8   Period Weeks   Status New   OT LONG TERM GOAL #2   Title Pt to perform dynamic standing tasks for light cleaning for 15 minutes or greater w/o rest or LOB    Time 8   Period Weeks   Status New   OT LONG TERM GOAL #3   Title Pt to perform divided attention tasks between physical and cognitive tasks with 90% or greater accuracy in prep for work related tasks   Time 8   Period Weeks   Status New               Plan - 08/16/14 1420    Clinical Impression Statement Pt is a 49 y.o. male who presents to outpatient rehab s/p CVA with Lt non dominant hemiparesis on 07/27/14. Pt has decreased balance, coordination, endurance, and possibly mild cognitive  deficits. Pt discharged from inpatient rehab on 08/12/14   Pt will benefit from skilled therapeutic intervention in order to improve on the following deficits (Retired) Decreased coordination;Decreased endurance;Decreased knowledge of precautions;Decreased activity tolerance;Decreased knowledge of use of DME;Impaired UE functional use;Decreased cognition;Decreased mobility;Decreased strength   Rehab Potential Good   OT Frequency 2x / week   OT Duration 8 weeks  plus evaluation   OT Treatment/Interventions Self-care/ADL training;DME and/or AE instruction;Patient/family education;Therapeutic exercises;Therapeutic activities;Neuromuscular education;Functional Mobility Training;Cognitive remediation/compensation;Energy conservation;Manual Therapy   Plan coordination HEP, assess money exchange and simple divided attn.    Consulted and Agree with Plan of Care Patient;Family member/caregiver        Problem List Patient Active Problem List   Diagnosis Date Noted  . Abnormal LFTs 08/12/2014  . Ataxia   . CVA (cerebral infarction) 08/03/2014  . ARF (acute renal failure)   . Insulin dependent diabetes mellitus   . 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 Intractable nausea and vomiting 07/27/2014  . Vertigo 07/27/2014  . Essential hypertension 07/27/2014  . Type 1 diabetes mellitus 05/29/2010  . Hyperlipidemia 05/29/2010  . Benign essential HTN 05/29/2010  . PLANTAR FASCIITIS 05/29/2010    Carey Bullocks, OTR/L 08/16/2014, 2:35 PM  [facility] [address] [place], [place], [address] Phone: [phone]   Fax:  [phone]

## 2014-08-17 ENCOUNTER — Ambulatory Visit: Payer: 59 | Admitting: Rehabilitative and Restorative Service Providers"

## 2014-08-17 VITALS — BP 170/88

## 2014-08-17 DIAGNOSIS — R269 Unspecified abnormalities of gait and mobility: Secondary | ICD-10-CM

## 2014-08-17 DIAGNOSIS — H814 Vertigo of central origin: Secondary | ICD-10-CM

## 2014-08-17 DIAGNOSIS — I6980 Unspecified sequelae of other cerebrovascular disease: Secondary | ICD-10-CM | POA: Diagnosis not present

## 2014-08-17 NOTE — Patient Instructions (Signed)
FOR SAFETY, PERFORM ALL 3 EXERCISES IN THE CORNER WITH YOUR WALKER IN FRONT OF YOU.  Only do while someone is at home.  Feet Together, Varied Arm Positions - Eyes Open   With eyes open, feet together, arms at your side, look straight ahead at a stationary object. Hold __30__ seconds. Repeat _3___ times per session. Do _2___ sessions per day.  Copyright  VHI. All rights reserved.  Feet Partial Heel-Toe, Varied Arm Positions - Eyes Open   With eyes open, right foot partially in front of the other, arms at your side, look straight ahead at a stationary object. Hold __30__ seconds, then switch feet. Repeat __3__ times per session. Do __2__ sessions per day.  Copyright  VHI. All rights reserved.  Feet Apart, Varied Arm Positions - Eyes Closed   Stand with feet shoulder width apart and arms at your side. Close eyes and visualize upright position. Hold __30__ seconds. Repeat _3___ times per session. Do __2__ sessions per day.  Copyright  VHI. All rights reserved.

## 2014-08-17 NOTE — Therapy (Signed)
[facility] [address] [place], [place], [address] Phone: [phone]   Fax:  [phone]  Physical Therapy Evaluation  Patient Details  Name: Adrian Neal MRN: [id number] Date of Birth: 1966/01/27 Referring Provider:  Anda Kraft, MD  Encounter Date: 08/17/2014      PT End of Session - 08/17/14 1203    Visit Number 1   Number of Visits 16   Date for PT Re-Evaluation 10/17/14   Authorization Type UHC private, check # of visits after eval   PT Start Time 1103   PT Stop Time 1150   PT Time Calculation (min) 47 min   Equipment Utilized During Treatment Gait belt   Activity Tolerance Patient tolerated treatment well   Behavior During Therapy [facility] for tasks assessed/performed      Past Medical History  Diagnosis Date  . Hypertension   . Diabetes mellitus without complication     diagnosed at age 42  . 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 Eye surgery  1990    for retinopathy   . Cataract extraction w/ intraocular lens implant  1994    Filed Vitals:   08/17/14 1137  BP: 170/88    Visit Diagnosis:  Abnormality of gait - Plan: PT plan of care cert/re-cert  Vertigo, central origin, unspecified laterality - Plan: PT plan of care cert/re-cert      Subjective Assessment - 08/17/14 1108    Subjective The patient is s/p CVA 07/27/2014 initially treated in acute care and then underwent IP rehab stay until 08/12/2014.  His cc: balance, dizziness in the morning, patient reports a "weight on me that wants to push me to the left".     Patient Stated Goals To be able to walk without RW.   Currently in Pain? No/denies            [facility] PT Assessment - 08/17/14 1111    Assessment   Medical Diagnosis L brainstem infarct   Onset Date --  07/27/2014   Precautions   Precautions Fall  no driving, out of work   Precaution Comments  dizziness 3/10 after rising in morning or rising after sitting x hours   Balance Screen   Has the patient fallen in the past 6 months No   Has the patient had a decrease in activity level because of a fear of falling?  Yes   Is the patient reluctant to leave their home because of a fear of falling?  No   Home Environment   Living Enviornment Private residence   Living Arrangements Spouse/significant other;Children   Type of [place] Access Stairs to enter   Entrance Stairs-Number of Steps 10   Entrance Stairs-Rails None   Home Layout Two level   Alternate Level Stairs-Number of Steps --  14   Alternate Level Stairs-Rails Left;Right  rail changes at landing and has wall to hold   [place] - 2 wheels   Prior Function   Level of Independence Independent with gait;Independent with homemaking with ambulation   Vocation Full time employment   Vocation Requirements --  restaurant manager/ [employer]   Observation/Other Assessments   Focus on Therapeutic Outcomes (FOTO)  45%   Other Surveys  --  SIS Mobility=64%   Sensation   Additional Comments denies change   Coordination   Gross Motor Movements are Fluid and  Coordinated Yes   Fine Motor Movements are Fluid and Coordinated Yes   Heel Shin Test [facility]   Posture/Postural Control   Posture/Postural Control Postural limitations   Posture Comments moves en bloc   ROM / Strength   AROM / PROM / Strength AROM;Strength   AROM   Overall AROM Comments WFLs   Strength   Overall Strength Comments B LEs 5/5 with MMT hip flexion, knee flexion/extension, ankle DF   Ambulation/Gait   Ambulation/Gait Yes   Ambulation/Gait Assistance 6: Modified independent (Device/Increase time)  with RW, and min A without device   Ambulation Distance (Feet) 200 Feet   Assistive device Rolling walker   Gait Pattern Step-to pattern  narrow base of support, short stride length   Ambulation Surface Level   Gait velocity 1.2 ft/sec  indicating "household ambulator" classification of gait   Stairs Yes   Stairs Assistance 6: Modified independent (Device/Increase time)   Stair Management Technique Two rails;Step to pattern   Number of Stairs --  4   Standardized Balance Assessment   Standardized Balance Assessment Berg Balance Test   Berg Balance Test   Sit to Stand Able to stand without using hands and stabilize independently   Standing Unsupported Able to stand 2 minutes with supervision   Sitting with Back Unsupported but Feet Supported on Floor or Stool Able to sit safely and securely 2 minutes   Stand to Sit Controls descent by using hands   Transfers Able to transfer safely, definite need of hands   Standing Unsupported with Eyes Closed Able to stand 10 seconds with supervision   Standing Ubsupported with Feet Together Needs help to attain position but able to stand for 30 seconds with feet together   From Standing, Reach Forward with Outstretched Arm Can reach forward >12 cm safely (5")   From Standing Position, Pick up Object from Floor Able to pick up shoe, needs supervision   From Standing Position, Turn to Look Behind Over each Shoulder Needs supervision when turning   Turn 360 Degrees Needs assistance while turning   Standing Unsupported, Alternately Place Feet on Step/Stool Needs assistance to keep from falling or unable to try   Standing Unsupported, One Foot in Front Able to take small step independently and hold 30 seconds   Standing on One Leg Unable to try or needs assist to prevent fall   Total Score 30/56      NEUROMUSCULAR RE-EDUCATION: Corner balance exercises consisting of: Standing on level surface with feet together and partial heel/toe with eyes open with RW for safety, then feet apart and eyes closed.         PT Education - 08/17/14 1148    Education provided Yes   Education Details HEP: feet together + eyes open, partial heel toe + eyes open, feet apart + eyes closed   Person(s)  Educated Patient;Spouse   Methods Explanation;Demonstration;Handout   Comprehension Returned demonstration;Verbalized understanding          PT Short Term Goals - 08/17/14 1203    PT SHORT TERM GOAL #1   Title The patient will be indep with HEP for balance, and mobility.  Target date 09/16/2014   Time 4   Period Weeks   PT SHORT TERM GOAL #2   Title The patient will improve Berg score from 30/56 up to 36/56 to demo decreased risk for falls.  Target date 09/16/2014   Time 4   Period Weeks   PT SHORT TERM GOAL #3  Title The patient will ambulate household distances/level surfaces (300 ft) without a device independently.  Target date 09/16/2014   Time 4   Period Weeks   PT SHORT TERM GOAL #4   Title The patient will increase walking speed from 1.2 ft/sec up to 1.8 ft/sec to demo decreased risk for falls (with least restrictive device).  Target date 09/16/2014   Time 4   Period Weeks   PT SHORT TERM GOAL #5   Title The patient will negotiate 4 steps x 3 reps with one handrail and reciprocal pattern modified indep. Target date 09/16/2014   Time 4   Period Weeks           PT Long Term Goals - 08/17/14 1206    PT LONG TERM GOAL #1   Title The patient will be indep with progression of HEP. Target date 10/17/2014   Time 8   Period Weeks   PT LONG TERM GOAL #2   Title The patient will improve stroke impact scale mobility by 15% (baseline 64%) to demo improved self perception of mobility.  Target date 10/17/2014   Time 8   Period Weeks   PT LONG TERM GOAL #3   Title The patient will improve Berg balance score from 30/56 up to 44/56 to demo decreasing risk for falls.  Target date 10/17/2014   Time 8   Period Weeks   PT LONG TERM GOAL #4   Title The patient will improve gait speed from 1.2 ft/sec up to 2.6 ft/sec to demo transition to "full community ambulator" classification of gait.  Target date 10/17/2014   Time 8   Period Weeks   PT LONG TERM GOAL #5   Title The patient will  verbalize understanding of stroke risk factors and warning signs.  Target date 10/17/2014   Time 8   Period Weeks   Additional Long Term Goals   Additional Long Term Goals Yes   PT LONG TERM GOAL #6   Title The patient will negotiate level and unlevel community surfaces with least restrictive assistive device x 1200 ft modified indep.  Target date 10/17/2014   Time 8   Period Weeks               Plan - 08/17/14 1209    Clinical Impression Statement The patient is a 49 yo male s/p CVA 07/27/2014 presenting to outpatient physical therapy with high fall risk per [name] and gait speed and decreased midline orientation.  He will benefit from physical therapy to optimize current functional status.   Pt will benefit from skilled therapeutic intervention in order to improve on the following deficits Abnormal gait;Difficulty walking;Postural dysfunction;Decreased balance;Decreased activity tolerance;Decreased mobility   Rehab Potential Good   PT Frequency 2x / week   PT Duration 8 weeks   PT Treatment/Interventions Therapeutic activities;Patient/family education;Therapeutic exercise;Manual techniques;Balance training;Gait training;Neuromuscular re-education;Stair training;Functional mobility training   PT Next Visit Plan dynamic gait and balance training, progress HEP (add 2 more balance or habituation activities)   Consulted and Agree with Plan of Care Patient;Family member/caregiver   Family Member Consulted spouse         Problem List Patient Active Problem List   Diagnosis Date Noted  . Abnormal LFTs 08/12/2014  . Ataxia   . CVA (cerebral infarction) 08/03/2014  . ARF (acute renal failure)   . Insulin dependent diabetes mellitus   . 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 Intractable nausea and vomiting 07/27/2014  . Vertigo 07/27/2014  .  Essential hypertension 07/27/2014  . Type 1 diabetes mellitus 05/29/2010  . Hyperlipidemia 05/29/2010  . Benign essential  HTN 05/29/2010  . PLANTAR FASCIITIS 05/29/2010    Thank you for the referral of this patient.  [employer], [place] 08/17/2014, 12:16 PM  [facility] [address] [place], [place], [address] Phone: [phone]   Fax:  [phone]

## 2014-08-23 ENCOUNTER — Ambulatory Visit: Payer: 59 | Admitting: Physical Therapy

## 2014-08-23 ENCOUNTER — Encounter: Payer: Self-pay | Admitting: Physical Therapy

## 2014-08-23 ENCOUNTER — Ambulatory Visit: Payer: 59 | Admitting: Occupational Therapy

## 2014-08-23 DIAGNOSIS — R6889 Other general symptoms and signs: Secondary | ICD-10-CM

## 2014-08-23 DIAGNOSIS — R269 Unspecified abnormalities of gait and mobility: Secondary | ICD-10-CM

## 2014-08-23 DIAGNOSIS — IMO0002 Reserved for concepts with insufficient information to code with codable children: Secondary | ICD-10-CM

## 2014-08-23 DIAGNOSIS — I6980 Unspecified sequelae of other cerebrovascular disease: Secondary | ICD-10-CM | POA: Diagnosis not present

## 2014-08-23 DIAGNOSIS — I69319 Unspecified symptoms and signs involving cognitive functions following cerebral infarction: Secondary | ICD-10-CM

## 2014-08-23 DIAGNOSIS — H814 Vertigo of central origin: Secondary | ICD-10-CM

## 2014-08-23 DIAGNOSIS — Z7409 Other reduced mobility: Secondary | ICD-10-CM

## 2014-08-23 DIAGNOSIS — R531 Weakness: Secondary | ICD-10-CM

## 2014-08-23 NOTE — Therapy (Signed)
[facility] [address] [place], [place], [address] Phone: [phone]   Fax:  [phone]  Physical Therapy Treatment  Patient Details  Name: Adrian Neal MRN: [id number] Date of Birth: [birth year] Referring Provider:  Anda Kraft, MD  Encounter Date: 08/23/2014      PT End of Session - 08/23/14 1022    Visit Number 2   Number of Visits 16   Date for PT Re-Evaluation 10/17/14   Authorization Type U[employer] private, check # of visits after eval   PT Start Time 1015   PT Stop Time 1055   PT Time Calculation (min) 40 min   Equipment Utilized During Treatment Gait belt   Activity Tolerance Patient tolerated treatment well   Behavior During Therapy [facility] for tasks assessed/performed      Past Medical History  Diagnosis Date  . Hypertension   . Diabetes mellitus without complication     diagnosed at age 57  . 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 Eye surgery  1990    for retinopathy   . Cataract extraction w/ intraocular lens implant  1994    There were no vitals filed for this visit.  Visit Diagnosis:  Lack of coordination due to stroke  Abnormality of gait  Vertigo, central origin, unspecified laterality      Subjective Assessment - 08/23/14 1021    Subjective No new complaints. Dizziness is the same. Doing his current HEP without any issues. No pain or falls to report.   Currently in Pain? No/denies   Pain Score 0-No pain          Vestibular Treatment/Exercise - 08/23/14 1023    Vestibular Treatment/Exercise   Vestibular Treatment Provided Habituation   Habituation Exercises Laruth Bouchard Daroff;Comment  picking up/putting down cones to/from floor   [employer]   Number of Reps  2   Symptom Description  no increase in sypmtoms reported     Neuro re-ed continue:  Seated at edge of mat: bending down to retrieve cones  and then put them back down using 6 cones x 3 reps. Reported mild increase in dizziness. Added to HEP.  In corner with chair in front of him: Reviewed current HEP and provided cues on technique as needed.  Narrow base of support: Eyes open with head nods, shakes and diagonals both ways x 10 each way. Eyes closed with head nods, shakes, and diagonals both ways x 10 each way. Added to HEP.         PT Short Term Goals - 08/17/14 1203    PT SHORT TERM GOAL #1   Title The patient will be indep with HEP for balance, and mobility.  Target date 09/16/2014   Time 4   Period Weeks   PT SHORT TERM GOAL #2   Title The patient will improve Berg score from 30/56 up to 36/56 to demo decreased risk for falls.  Target date 09/16/2014   Time 4   Period Weeks   PT SHORT TERM GOAL #3   Title The patient will ambulate household distances/level surfaces (300 ft) without a device independently.  Target date 09/16/2014   Time 4   Period Weeks   PT SHORT TERM GOAL #4   Title The patient will increase walking speed from 1.2 ft/sec up to 1.8 ft/sec to demo decreased risk for falls (with least restrictive device).  Target date  09/16/2014   Time 4   Period Weeks   PT SHORT TERM GOAL #5   Title The patient will negotiate 4 steps x 3 reps with one handrail and reciprocal pattern modified indep. Target date 09/16/2014   Time 4   Period Weeks           PT Long Term Goals - 08/17/14 1206    PT LONG TERM GOAL #1   Title The patient will be indep with progression of HEP. Target date 10/17/2014   Time 8   Period Weeks   PT LONG TERM GOAL #2   Title The patient will improve stroke impact scale mobility by 15% (baseline 64%) to demo improved self perception of mobility.  Target date 10/17/2014   Time 8   Period Weeks   PT LONG TERM GOAL #3   Title The patient will improve Berg balance score from 30/56 up to 44/56 to demo decreasing risk for falls.  Target date 10/17/2014   Time 8   Period Weeks   PT LONG TERM  GOAL #4   Title The patient will improve gait speed from 1.2 ft/sec up to 2.6 ft/sec to demo transition to "full community ambulator" classification of gait.  Target date 10/17/2014   Time 8   Period Weeks   PT LONG TERM GOAL #5   Title The patient will verbalize understanding of stroke risk factors and warning signs.  Target date 10/17/2014   Time 8   Period Weeks   Additional Long Term Goals   Additional Long Term Goals Yes   PT LONG TERM GOAL #6   Title The patient will negotiate level and unlevel community surfaces with least restrictive assistive device x 1200 ft modified indep.  Target date 10/17/2014   Time 8   Period Weeks           Plan - 08/23/14 1022    Clinical Impression Statement Pt making progress toward goals. Continues to be challenged with narrowed base of support and complaint surfaces, both with his eyes open and closed.   Pt will benefit from skilled therapeutic intervention in order to improve on the following deficits Abnormal gait;Difficulty walking;Postural dysfunction;Decreased balance;Decreased activity tolerance;Decreased mobility   Rehab Potential Good   PT Frequency 2x / week   PT Duration 8 weeks   PT Treatment/Interventions Therapeutic activities;Patient/family education;Therapeutic exercise;Manual techniques;Balance training;Gait training;Neuromuscular re-education;Stair training;Functional mobility training   PT Next Visit Plan dynamic gait and balance training, progress HEP as able   Consulted and Agree with Plan of Care Patient;Family member/caregiver   Family Member Consulted spouse        Problem List Patient Active Problem List   Diagnosis Date Noted  . Abnormal LFTs 08/12/2014  . Ataxia   . CVA (cerebral infarction) 08/03/2014  . ARF (acute renal failure)   . Insulin dependent diabetes mellitus   . 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 Intractable nausea and vomiting 07/27/2014  . Vertigo 07/27/2014  . Essential  hypertension 07/27/2014  . Type 1 diabetes mellitus 05/29/2010  . Hyperlipidemia 05/29/2010  . Benign essential HTN 05/29/2010  . PLANTAR FASCIITIS 05/29/2010    Willow Ora 08/23/2014, 6:18 PM  Willow Ora, PTA, [facility] [address] [place], [place] [address] [phone] 08/23/2014, 6:18 PM

## 2014-08-23 NOTE — Therapy (Signed)
[facility] [address] [place], [place], [address] Phone: [phone]   Fax:  [phone]  Occupational Therapy Treatment  Patient Details  Name: Adrian Neal MRN: [id number] Date of Birth: 1966/03/08 Referring Provider:  Anda Kraft, MD  Encounter Date: 08/23/2014      OT End of Session - 08/23/14 0943    Visit Number 2   Number of Visits 17   Date for OT Re-Evaluation 10/15/14   Authorization Type UHC   OT Start Time [phone]   OT Stop Time 1015   OT Time Calculation (min) 39 min   Activity Tolerance Patient tolerated treatment well   Behavior During Therapy [facility] for tasks assessed/performed      Past Medical History  Diagnosis Date  . Hypertension   . Diabetes mellitus without complication     diagnosed at age 75  . 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 Eye surgery  1990    for retinopathy   . Cataract extraction w/ intraocular lens implant  1994    There were no vitals filed for this visit.  Visit Diagnosis:  Lack of coordination due to stroke  Decreased strength, endurance, and mobility  Cognitive deficits following cerebral infarction      Subjective Assessment - 08/23/14 0939    Subjective  The left side is my weak one.   Patient Stated Goals To get my balance back and more efficiency with self care needs   Currently in Pain? No/denies        Treatment: Fine motor coordination HEP issued, pt returned demonstration. Simple money exchange worksheet, Pt made 1 error in totals, and 1 error in dividing items into correct denominations.Copying small peg design with  LUE for increased fine motor coordination/ cognition, Pt completed without errors.               Vestibular Treatment/Exercise - 08/23/14 1023    Vestibular Treatment/Exercise   Vestibular Treatment Provided Habituation   Habituation  Exercises Laruth Bouchard Daroff;Comment  picking up/putting down cones to/from floor   [employer]   Number of Reps  2   Symptom Description  no increase in sypmtoms reported               OT Education - 08/23/14 0953    Education provided Yes   Education Details coordiantion HEP   Person(s) Educated Patient   Methods Explanation;Demonstration;Handout   Comprehension Verbalized understanding;Returned demonstration          OT Short Term Goals - 08/16/14 1424    OT SHORT TERM GOAL #1   Title Independent w/ HEP for coordination (due 09/15/14)   Time 4   Period Weeks   Status New   OT SHORT TERM GOAL #2   Title Pt to simulate tub transfer with tub transfer bench safely and verbalize understanding of acquistion of DME   Time 4   Period Weeks   Status New   OT SHORT TERM GOAL #3   Title Pt to demo sufficient strength and endurance LUE to retrieve/replace 5 lb. object from overhead shelf x 10 reps without rest   Time 4   Period Weeks   Status New   OT SHORT TERM GOAL #4   Title Pt to perform complex money exchange with 90% or higher accuracy in prep for work related tasks   Time 4   Period Weeks  Status New   OT SHORT TERM GOAL #5   Title Improve LUE functional use as evidenced by performing 42 or greater blocks on Box & Blocks test   Baseline 36   Time 4   Period Weeks   Status New           OT Long Term Goals - 08/16/14 1427    OT LONG TERM GOAL #1   Title Improve coordination as evidenced by performing 9 hole peg test in 25 sec. or under (due 10/15/14)   Baseline 30.81 sec.    Time 8   Period Weeks   Status New   OT LONG TERM GOAL #2   Title Pt to perform dynamic standing tasks for light cleaning for 15 minutes or greater w/o rest or LOB    Time 8   Period Weeks   Status New   OT LONG TERM GOAL #3   Title Pt to perform divided attention tasks between physical and cognitive tasks with 90% or greater accuracy in prep for work related tasks   Time [place] - 08/23/14 0954    Clinical Impression Statement Pt is progressing towards goals for fine motor coordination/ cognition.   Plan simple divided attention, organization   Consulted and Agree with Plan of Care Patient        Problem List Patient Active Problem List   Diagnosis Date Noted  . Abnormal LFTs 08/12/2014  . Ataxia   . CVA (cerebral infarction) 08/03/2014  . ARF (acute renal failure)   . Insulin dependent diabetes mellitus   . 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 Intractable nausea and vomiting 07/27/2014  . Vertigo 07/27/2014  . Essential hypertension 07/27/2014  . Type 1 diabetes mellitus 05/29/2010  . Hyperlipidemia 05/29/2010  . Benign essential HTN 05/29/2010  . PLANTAR FASCIITIS 05/29/2010    RINE,KATHRYN 08/23/2014, 1:30 PM Theone Murdoch, OTR/L [phone] Phone: [phone] 1:30 PM 08/23/2014 [facility] [address] [place], [place], [address] Phone: [phone]   Fax:  [phone]

## 2014-08-23 NOTE — Patient Instructions (Signed)
Tip Card 1.The goal of habituation training is to assist in decreasing symptoms of vertigo, dizziness, or nausea provoked by specific head and body motions. 2.These exercises may initially increase symptoms; however, be persistent and work through symptoms. With repetition and time, the exercises will assist in reducing or eliminating symptoms. 3.Exercises should be stopped and discussed with the therapist if you experience any of the following: - Sudden change or fluctuation in hearing - New onset of ringing in the ears, or increase in current intensity - Any fluid discharge from the ear - Severe pain in neck or back - Extreme nausea  Copyright  VHI. All rights reserved.  Bending / Picking Up Objects   Sitting, slowly bend head down and pick up object on the floor. Return to upright position. Hold position until symptoms subside. Repeat _10___ times per session. Do _1-2 sessions per day.  Copyright  VHI. All rights reserved.  Feet Together, Head Motion - Eyes Closed  In corner with walker support as needed With eyes closed and feet together: 1. Move head slowly, up and down x 10 each way 2. Move head slowly, left and right, x 10 each way 3. Move head slowly, diagonals both ways x 10 each way Repeat 1-2 times a day. Copyright  VHI. All rights reserved.

## 2014-08-23 NOTE — Patient Instructions (Signed)
  Coordination Activities  Perform the following activities for 20 minutes 1 times per day with left hand(s).   Rotate ball in fingertips (clockwise and counter-clockwise).  Toss ball between hands.  Toss ball in air and catch with the same hand.  Flip cards 1 at a time as fast as you can.  Deal cards with your thumb (Hold deck in hand and push card off top with thumb).  Pick up coins, buttons, marbles, dried beans/pasta of different sizes and place in container.  Pick up coins and place in container or coin bank.  Pick up coins and stack.  Pick up coins one at a time until you get 5-10 in your hand, then move coins from palm to fingertips to stack one at a time. 

## 2014-08-26 ENCOUNTER — Ambulatory Visit: Payer: 59 | Admitting: Occupational Therapy

## 2014-08-26 ENCOUNTER — Ambulatory Visit: Payer: 59 | Admitting: Physical Therapy

## 2014-08-26 DIAGNOSIS — Z7409 Other reduced mobility: Secondary | ICD-10-CM

## 2014-08-26 DIAGNOSIS — R531 Weakness: Secondary | ICD-10-CM

## 2014-08-26 DIAGNOSIS — R269 Unspecified abnormalities of gait and mobility: Secondary | ICD-10-CM

## 2014-08-26 DIAGNOSIS — I69319 Unspecified symptoms and signs involving cognitive functions following cerebral infarction: Secondary | ICD-10-CM

## 2014-08-26 DIAGNOSIS — I6980 Unspecified sequelae of other cerebrovascular disease: Secondary | ICD-10-CM | POA: Diagnosis not present

## 2014-08-26 DIAGNOSIS — R6889 Other general symptoms and signs: Secondary | ICD-10-CM

## 2014-08-26 DIAGNOSIS — IMO0002 Reserved for concepts with insufficient information to code with codable children: Secondary | ICD-10-CM

## 2014-08-26 NOTE — Therapy (Signed)
[facility] [address] [place], [place], [address] Phone: [phone]   Fax:  [phone]  Occupational Therapy Treatment  Patient Details  Name: Adrian Neal MRN: [id number] Date of Birth: October 21, 1965 Referring Provider:  Anda Kraft, MD  Encounter Date: 08/26/2014    Past Medical History  Diagnosis Date  . Hypertension   . Diabetes mellitus without complication     diagnosed at age 49  . 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 Eye surgery  1990    for retinopathy   . Cataract extraction w/ intraocular lens implant  1994    There were no vitals filed for this visit.  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 No/denies           Treatment: Organizing your day task complex activity in a min -mod distracting environment, pt made only one omission on problem #1 which he was able to recognize with min v.c. Problem #2: Pt correctly included all items yet he did not a lot adequate time for several tasks, pt  wrote down all items he needed to include on a scrap piece of paper prior to final after min v.c.  from therapist.Fine motor coordination task, placing grooved pegs in pegboard with LUE, min difficulty, emphasis on in hand manipulation/ translation when removing, min drops.                     OT Short Term Goals - 08/16/14 1424    OT SHORT TERM GOAL #1   Title Independent w/ HEP for coordination (due 09/15/14)   Time 4   Period Weeks   Status New   OT SHORT TERM GOAL #2   Title Pt to simulate tub transfer with tub transfer  bench safely and verbalize understanding of acquistion of DME   Time 4   Period Weeks   Status New   OT SHORT TERM GOAL #3   Title Pt to demo sufficient strength and endurance LUE to retrieve/replace 5 lb. object from overhead shelf x 10 reps without rest   Time 4   Period Weeks   Status New   OT SHORT TERM GOAL #4   Title Pt to perform complex money exchange with 90% or higher accuracy in prep for work related tasks   Time 4   Period Weeks   Status New   OT SHORT TERM GOAL #5   Title Improve LUE functional use as evidenced by performing 42 or greater blocks on Box & Blocks test   Baseline 36   Time 4   Period Weeks   Status New           OT Long Term Goals - 08/16/14 1427    OT LONG TERM GOAL #1   Title Improve coordination as evidenced by performing 9 hole peg test in 25 sec. or under (due 10/15/14)   Baseline 30.81 sec.    Time 8   Period Weeks   Status New   OT LONG TERM GOAL #2   Title Pt to perform dynamic standing tasks for light  cleaning for 15 minutes or greater w/o rest or LOB    Time 8   Period Weeks   Status New   OT LONG TERM GOAL #3   Title Pt to perform divided attention tasks between physical and cognitive tasks with 90% or greater accuracy in prep for work related tasks   Time 8   Period Weeks   Status New               Plan - 08/26/14 0947    Clinical Impression Statement P        Problem List Patient Active Problem List   Diagnosis Date Noted  . Abnormal LFTs 08/12/2014  . Ataxia   . CVA (cerebral infarction) 08/03/2014  . ARF (acute renal failure)   . Insulin dependent diabetes mellitus   . 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 Intractable nausea and vomiting 07/27/2014  . Vertigo 07/27/2014  . Essential hypertension 07/27/2014  . Type 1 diabetes mellitus 05/29/2010  . Hyperlipidemia 05/29/2010  . Benign essential HTN 05/29/2010  . PLANTAR FASCIITIS 05/29/2010    RINE,KATHRYN 08/26/2014, 9:47  AM Theone Murdoch, OTR/L [phone] Phone: [phone] 9:47 AM 08/26/2014 [facility] [address] [place], [place], [address] Phone: [phone]   Fax:  [phone]

## 2014-08-26 NOTE — Therapy (Signed)
[facility] [address] [place], [place], [address] Phone: [phone]   Fax:  [phone]  Physical Therapy Treatment  Patient Details  Name: Adrian Neal MRN: [id number] Date of Birth: November 15, 1965 Referring Provider:  Anda Kraft, MD  Encounter Date: 08/26/2014      PT End of Session - 08/26/14 1223    Visit Number 3   Number of Visits 16   Date for PT Re-Evaluation 10/17/14   PT Start Time [id number]   PT Stop Time 1100   PT Time Calculation (min) 45 min   Equipment Utilized During Treatment Gait belt   Activity Tolerance Patient tolerated treatment well   Behavior During Therapy [facility] for tasks assessed/performed      Past Medical History  Diagnosis Date  . Hypertension   . Diabetes mellitus without complication     diagnosed at age 60  . 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 Eye surgery  1990    for retinopathy   . Cataract extraction w/ intraocular lens implant  1994    There were no vitals filed for this visit.  Visit Diagnosis:  No diagnosis found.      Subjective Assessment - 08/26/14 1219    Subjective No new complaints. Dizziness is the same. Doing his current HEP without any issues. No pain or falls to report.   Currently in Pain? No/denies      Neuro Red-ed:  Pt practiced standing on compliant surfaces: Standing on round surface of BOSU ball practicing maintaining stance in the following positions for 10 reps with CGA for safety: -head turns to left and right -eyes closed  -narrow BOS (feet together) Pt tends to lean to left and requires CGA guard to left for safety.   Standing on tilt board, practicing weight shifting to left and right with slow, controlled movements to improve balance and coordination (10 reps to left and right). Pt also practiced maintaining stance on tilt board with eyes closed for  approximately 5 seconds to improve proprioception and balance.   Walking along counter (approximatley 10 feet) X 4 reps under each of the following conditions:  Tandem gait, eyes closed, head turns horizontally, head turns vertically. Min guard assist with UE support on counter for balance.   Gait: Also worked on reciprocal steps at counter to improve gait pattern and cadence for carryover to cane, with cues on step/stride length and for foot clearance.   -115 ft with SBA/CGA and straight cane, pt requires appropriate cues with managing cane performing heel strike/toe off motion. Cues on reciprocal steps and for posture with gait.         PT Education - 08/26/14 1221    Education provided Yes   Education Details gait training promoting adequate heel strike and toe off   Person(s) Educated Patient   Methods Explanation;Demonstration;Verbal cues   Comprehension Verbalized understanding;Returned demonstration          PT Short Term Goals - 08/17/14 1203    PT SHORT TERM GOAL #1   Title The patient will be indep with HEP for balance, and mobility.  Target date 09/16/2014   Time 4   Period Weeks   PT SHORT TERM GOAL #2   Title The patient will improve Berg score from 30/56 up to 36/56 to demo decreased risk for falls.  Target date 09/16/2014   Time 4  Period Weeks   PT SHORT TERM GOAL #3   Title The patient will ambulate household distances/level surfaces (300 ft) without a device independently.  Target date 09/16/2014   Time 4   Period Weeks   PT SHORT TERM GOAL #4   Title The patient will increase walking speed from 1.2 ft/sec up to 1.8 ft/sec to demo decreased risk for falls (with least restrictive device).  Target date 09/16/2014   Time 4   Period Weeks   PT SHORT TERM GOAL #5   Title The patient will negotiate 4 steps x 3 reps with one handrail and reciprocal pattern modified indep. Target date 09/16/2014   Time 4   Period Weeks           PT Long Term Goals - 08/17/14  1206    PT LONG TERM GOAL #1   Title The patient will be indep with progression of HEP. Target date 10/17/2014   Time 8   Period Weeks   PT LONG TERM GOAL #2   Title The patient will improve stroke impact scale mobility by 15% (baseline 64%) to demo improved self perception of mobility.  Target date 10/17/2014   Time 8   Period Weeks   PT LONG TERM GOAL #3   Title The patient will improve Berg balance score from 30/56 up to 44/56 to demo decreasing risk for falls.  Target date 10/17/2014   Time 8   Period Weeks   PT LONG TERM GOAL #4   Title The patient will improve gait speed from 1.2 ft/sec up to 2.6 ft/sec to demo transition to "full community ambulator" classification of gait.  Target date 10/17/2014   Time 8   Period Weeks   PT LONG TERM GOAL #5   Title The patient will verbalize understanding of stroke risk factors and warning signs.  Target date 10/17/2014   Time 8   Period Weeks   Additional Long Term Goals   Additional Long Term Goals Yes   PT LONG TERM GOAL #6   Title The patient will negotiate level and unlevel community surfaces with least restrictive assistive device x 1200 ft modified indep.  Target date 10/17/2014   Time 8   Period Weeks           Plan - 08/26/14 1225    Clinical Impression Statement pt continues to make progress towards his goals and is challanged by narrow BOS, standing on compliant surfaces, and with his eyes closed. pt demostrated improvement with gait pattern after being provided appropriate verbal cues for heel strike with toe off and adequate step length.    Pt will benefit from skilled therapeutic intervention in order to improve on the following deficits Abnormal gait;Difficulty walking;Postural dysfunction;Decreased balance;Decreased activity tolerance;Decreased mobility   Rehab Potential Good   PT Frequency 2x / week   PT Duration 8 weeks   PT Treatment/Interventions Therapeutic activities;Patient/family education;Therapeutic exercise;Manual  techniques;Balance training;Gait training;Neuromuscular re-education;Stair training;Functional mobility training   PT Next Visit Plan dynamic gait using both SPC and walker,  balance training, progress HEP as able   Consulted and Agree with Plan of Care Patient        Problem List Patient Active Problem List   Diagnosis Date Noted  . Abnormal LFTs 08/12/2014  . Ataxia   . CVA (cerebral infarction) 08/03/2014  . ARF (acute renal failure)   . Insulin dependent diabetes mellitus   . 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 Intractable nausea and vomiting 07/27/2014  .  Vertigo 07/27/2014  . Essential hypertension 07/27/2014  . Type 1 diabetes mellitus 05/29/2010  . Hyperlipidemia 05/29/2010  . Benign essential HTN 05/29/2010  . PLANTAR FASCIITIS 05/29/2010    Fanny Dance 08/26/2014, 12:31 PM  [facility] [address] [place], [place], [address] Phone: [phone]   Fax:  [phone]

## 2014-08-30 ENCOUNTER — Ambulatory Visit: Payer: 59 | Admitting: Physical Therapy

## 2014-08-30 ENCOUNTER — Ambulatory Visit: Payer: 59 | Attending: Physical Medicine & Rehabilitation | Admitting: Occupational Therapy

## 2014-08-30 DIAGNOSIS — R531 Weakness: Secondary | ICD-10-CM

## 2014-08-30 DIAGNOSIS — Z7409 Other reduced mobility: Secondary | ICD-10-CM

## 2014-08-30 DIAGNOSIS — R279 Unspecified lack of coordination: Secondary | ICD-10-CM | POA: Diagnosis not present

## 2014-08-30 DIAGNOSIS — I6931 Cognitive deficits following cerebral infarction: Secondary | ICD-10-CM | POA: Insufficient documentation

## 2014-08-30 DIAGNOSIS — R269 Unspecified abnormalities of gait and mobility: Secondary | ICD-10-CM | POA: Diagnosis not present

## 2014-08-30 DIAGNOSIS — H8149 Vertigo of central origin, unspecified ear: Secondary | ICD-10-CM | POA: Diagnosis not present

## 2014-08-30 DIAGNOSIS — I6980 Unspecified sequelae of other cerebrovascular disease: Secondary | ICD-10-CM | POA: Diagnosis present

## 2014-08-30 DIAGNOSIS — R6889 Other general symptoms and signs: Secondary | ICD-10-CM

## 2014-08-30 DIAGNOSIS — IMO0002 Reserved for concepts with insufficient information to code with codable children: Secondary | ICD-10-CM

## 2014-08-30 DIAGNOSIS — I69319 Unspecified symptoms and signs involving cognitive functions following cerebral infarction: Secondary | ICD-10-CM

## 2014-08-30 NOTE — Therapy (Signed)
[facility] [address] [place], [place], [address] Phone: [phone]   Fax:  [phone]  Occupational Therapy Treatment  Patient Details  Name: Adrian Neal MRN: [id number] Date of Birth: 1966-02-03 Referring Provider:  Anda Kraft, MD  Encounter Date: 08/30/2014      OT End of Session - 08/30/14 1309    Visit Number 4   Number of Visits 17   Date for OT Re-Evaluation 10/15/14   Authorization Type [employer]   OT Start Time 1104   OT Stop Time 1145   OT Time Calculation (min) 41 min   Activity Tolerance Patient tolerated treatment well      Past Medical History  Diagnosis Date  . Hypertension   . Diabetes mellitus without complication     diagnosed at age 72  . 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 Eye surgery  1990    for retinopathy   . Cataract extraction w/ intraocular lens implant  1994    There were no vitals filed for this visit.  Visit Diagnosis:  Cognitive deficits following cerebral infarction  Lack of coordination due to stroke      Subjective Assessment - 08/30/14 1112    Subjective  My Lt hand is getting better little by little   Patient Stated Goals To get my balance back and more efficiency with self care needs   Currently in Pain? No/denies                      OT Treatments/Exercises (OP) - 08/30/14 1118    Cognitive Exercises   Attention Span Divided Pt tossing ball (alternating Rt/Lt) while mentally subtracting by 7's with no errors in calculations, mod difficulty performing tasks simultaneously   Other Cognitive Exercises 1 Multi-step moderate to complex word problems: "trial #2" @ 100% accuracy with slightly increased time. Pt then progressed to Set 28 (problems 80, 81, 82): Pt with small errors in calculations and attention to detail in word problem that caused incorrect answer for  problems #80 and #81. Pt completed #82 correctly (all from set 28)   Fine Motor Coordination   Fine Motor Coordination Small Pegboard   Small Pegboard Pt placing small pegs in pegboard Lt hand manipulating 5 pegs at a time for placement with min to mod difficulty and min drops. Unable to finish d/t time constraints                  OT Short Term Goals - 08/30/14 1310    OT SHORT TERM GOAL #1   Title Independent w/ HEP for coordination (due 09/15/14)   Time 4   Period Weeks   Status Achieved   OT SHORT TERM GOAL #2   Title Pt to simulate tub transfer with tub transfer bench safely and verbalize understanding of acquistion of DME   Time 4   Period Weeks   Status Deferred  Pt does not wish to pursue purchasing DME at this time   OT SHORT TERM GOAL #3   Title Pt to demo sufficient strength and endurance LUE to retrieve/replace 5 lb. object from overhead shelf x 10 reps without rest   Time 4   Period Weeks   Status On-going   OT SHORT TERM GOAL #4   Title Pt to perform complex money exchange with 90% or higher accuracy in prep for work related  tasks   Time 4   Period Weeks   Status On-going   OT SHORT TERM GOAL #5   Title Improve LUE functional use as evidenced by performing 42 or greater blocks on Box & Blocks test   Baseline 36   Time 4   Period Weeks   Status On-going           OT Long Term Goals - 08/16/14 1427    OT LONG TERM GOAL #1   Title Improve coordination as evidenced by performing 9 hole peg test in 25 sec. or under (due 10/15/14)   Baseline 30.81 sec.    Time 8   Period Weeks   Status New   OT LONG TERM GOAL #2   Title Pt to perform dynamic standing tasks for light cleaning for 15 minutes or greater w/o rest or LOB    Time 8   Period Weeks   Status New   OT LONG TERM GOAL #3   Title Pt to perform divided attention tasks between physical and cognitive tasks with 90% or greater accuracy in prep for work related tasks   Time 8   Period Weeks    Status New               Plan - 08/30/14 1311    Clinical Impression Statement Pt met STG #1. Deferred STG #2 secondary to not wishing to pursue acquiring DME at this time. Pt may consider all in shower seat which will allow him to sit for showering, however therapist did state tub transfer bench would be the safest option for him. Pt verbalized understanding   Plan coordination, LUE strength, continue cognition (complex organization, complex math, divided attn)   OT Home Exercise Plan coordination HEP issued 08/23/14   Consulted and Agree with Plan of Care Patient        Problem List Patient Active Problem List   Diagnosis Date Noted  . Abnormal LFTs 08/12/2014  . Ataxia   . CVA (cerebral infarction) 08/03/2014  . ARF (acute renal failure)   . Insulin dependent diabetes mellitus   . 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 Intractable nausea and vomiting 07/27/2014  . Vertigo 07/27/2014  . Essential hypertension 07/27/2014  . Type 1 diabetes mellitus 05/29/2010  . Hyperlipidemia 05/29/2010  . Benign essential HTN 05/29/2010  . PLANTAR FASCIITIS 05/29/2010    Carey Bullocks, OTR/L 08/30/2014, 1:14 PM  [facility] [address] [place], [place], [address] Phone: [phone]   Fax:  [phone]

## 2014-08-30 NOTE — Therapy (Signed)
[facility] [address] [place], [place], [address] Phone: [phone]   Fax:  [phone]  Physical Therapy Treatment  Patient Details  Name: Adrian Neal MRN: [id number] Date of Birth: 08-Apr-1966 Referring Provider:  Anda Kraft, MD  Encounter Date: 08/30/2014      PT End of Session - 08/30/14 1225    Visit Number 4   Number of Visits 16   Date for PT Re-Evaluation 10/17/14   Authorization Type UHC private, check # of visits after eval   PT Start Time 1018   PT Stop Time 1100   PT Time Calculation (min) 42 min   Equipment Utilized During Treatment Gait belt   Activity Tolerance Patient tolerated treatment well   Behavior During Therapy [facility] for tasks assessed/performed      Past Medical History  Diagnosis Date  . Hypertension   . Diabetes mellitus without complication     diagnosed at age 11  . 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 Eye surgery  1990    for retinopathy   . Cataract extraction w/ intraocular lens implant  1994    There were no vitals filed for this visit.  Visit Diagnosis:  Abnormality of gait  Lack of coordination due to stroke  Decreased strength, endurance, and mobility      Subjective Assessment - 08/30/14 1221    Subjective pt has no new complaints today and reports he has been compliant with HEP. pt states that the seated/bending to pick up cones exercise in his HEP only slightly brings on his dizziness now. No pain or falls to report.    Patient Stated Goals To be able to walk without RW.   Currently in Pain? No/denies   Pain Score 0-No pain      TREATMENTS:  Gait Training: -3 laps around gym using straight cane and CGA, providing cues for management of cane and step length. Adequate heel strike and toe off emphasized.   -Ambulating 4 laps at parallel bars using left bar for support and  mirrors at both ends to provide visual feedback for gait pattern and postural alignment. Emphasis and cues for heel strike and toe off. CGA for safety. -Stair training: ascending and descending 4 steps x 2 with CGA and only using left railing as pt only has railing on left side at home.   Neuro Ed: - using counter for support on compliant surface with CGA: 4 laps with narrow BOS, 4 laps side stepping -Side stepping 4 laps at parallel bars using one bar for support and emphasizing stance with feet completely together before steps. Pt demonstrates more difficulty with sidestepping to the left. CGA for safety.  -Tandem walking at parallel bars 4 laps cueing to only use bars when necessary. CGA for safety.  - Corner balance exercises with CGA: level with feet together, level with feet together and head turns in horizontal, vertical and diagonal directions, feet together with eyes closed, feet together with eyes closed and head turns.  - At bottom of steps, pt practiced coordination by placing forefoot on bottom two steps of stairs x 4 on each leg with CGA.          PT Education - 08/30/14 1223    Education provided Yes   Education Details what exercises are appropriate and not appropriate/safe to do at home; cane managment during gait; reviewed HEP  Person(s) Educated Patient   Methods Explanation   Comprehension Verbalized understanding          PT Short Term Goals - 08/17/14 1203    PT SHORT TERM GOAL #1   Title The patient will be indep with HEP for balance, and mobility.  Target date 09/16/2014   Time 4   Period Weeks   PT SHORT TERM GOAL #2   Title The patient will improve Berg score from 30/56 up to 36/56 to demo decreased risk for falls.  Target date 09/16/2014   Time 4   Period Weeks   PT SHORT TERM GOAL #3   Title The patient will ambulate household distances/level surfaces (300 ft) without a device independently.  Target date 09/16/2014   Time 4   Period Weeks   PT SHORT  TERM GOAL #4   Title The patient will increase walking speed from 1.2 ft/sec up to 1.8 ft/sec to demo decreased risk for falls (with least restrictive device).  Target date 09/16/2014   Time 4   Period Weeks   PT SHORT TERM GOAL #5   Title The patient will negotiate 4 steps x 3 reps with one handrail and reciprocal pattern modified indep. Target date 09/16/2014   Time 4   Period Weeks           PT Long Term Goals - 08/17/14 1206    PT LONG TERM GOAL #1   Title The patient will be indep with progression of HEP. Target date 10/17/2014   Time 8   Period Weeks   PT LONG TERM GOAL #2   Title The patient will improve stroke impact scale mobility by 15% (baseline 64%) to demo improved self perception of mobility.  Target date 10/17/2014   Time 8   Period Weeks   PT LONG TERM GOAL #3   Title The patient will improve Berg balance score from 30/56 up to 44/56 to demo decreasing risk for falls.  Target date 10/17/2014   Time 8   Period Weeks   PT LONG TERM GOAL #4   Title The patient will improve gait speed from 1.2 ft/sec up to 2.6 ft/sec to demo transition to "full community ambulator" classification of gait.  Target date 10/17/2014   Time 8   Period Weeks   PT LONG TERM GOAL #5   Title The patient will verbalize understanding of stroke risk factors and warning signs.  Target date 10/17/2014   Time 8   Period Weeks   Additional Long Term Goals   Additional Long Term Goals Yes   PT LONG TERM GOAL #6   Title The patient will negotiate level and unlevel community surfaces with least restrictive assistive device x 1200 ft modified indep.  Target date 10/17/2014   Time 8   Period Weeks               Plan - 08/30/14 1226    Clinical Impression Statement pt is making progress towards goals. pt tolerated treatment session well and stated that he feels the exercises are appropriat and helpful. Today's session focused on dynamic gait training on level and compliant surfaces, stair training,  and balance activities. pt is demonstrating increased steadiness using straight cane.   Pt will benefit from skilled therapeutic intervention in order to improve on the following deficits Abnormal gait;Difficulty walking;Postural dysfunction;Decreased balance;Decreased activity tolerance;Decreased mobility   Rehab Potential Good   PT Frequency 2x / week   PT Duration 8 weeks   PT  Treatment/Interventions Therapeutic activities;Patient/family education;Therapeutic exercise;Manual techniques;Balance training;Gait training;Neuromuscular re-education;Stair training;Functional mobility training   PT Next Visit Plan dynamic gait using straight cane emphasizing head turns, balance training, progress HEP as able and distribute handout to patient    Consulted and Agree with Plan of Care Patient      Problem List Patient Active Problem List   Diagnosis Date Noted  . Abnormal LFTs 08/12/2014  . Ataxia   . CVA (cerebral infarction) 08/03/2014  . ARF (acute renal failure)   . Insulin dependent diabetes mellitus   . 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 Intractable nausea and vomiting 07/27/2014  . Vertigo 07/27/2014  . Essential hypertension 07/27/2014  . Type 1 diabetes mellitus 05/29/2010  . Hyperlipidemia 05/29/2010  . Benign essential HTN 05/29/2010  . PLANTAR FASCIITIS 05/29/2010    Fanny Dance 08/30/2014, 12:30 PM  [facility] [address] [place], [place], [address] Phone: [phone]   Fax:  [phone]

## 2014-09-02 ENCOUNTER — Ambulatory Visit: Payer: 59 | Admitting: Occupational Therapy

## 2014-09-02 ENCOUNTER — Ambulatory Visit: Payer: 59 | Admitting: Physical Therapy

## 2014-09-02 DIAGNOSIS — I6980 Unspecified sequelae of other cerebrovascular disease: Secondary | ICD-10-CM | POA: Diagnosis not present

## 2014-09-02 DIAGNOSIS — R269 Unspecified abnormalities of gait and mobility: Secondary | ICD-10-CM

## 2014-09-02 DIAGNOSIS — R531 Weakness: Secondary | ICD-10-CM

## 2014-09-02 DIAGNOSIS — I69319 Unspecified symptoms and signs involving cognitive functions following cerebral infarction: Secondary | ICD-10-CM

## 2014-09-02 DIAGNOSIS — Z7409 Other reduced mobility: Secondary | ICD-10-CM

## 2014-09-02 DIAGNOSIS — R6889 Other general symptoms and signs: Secondary | ICD-10-CM

## 2014-09-02 DIAGNOSIS — IMO0002 Reserved for concepts with insufficient information to code with codable children: Secondary | ICD-10-CM

## 2014-09-02 DIAGNOSIS — H814 Vertigo of central origin: Secondary | ICD-10-CM

## 2014-09-02 NOTE — Therapy (Signed)
[facility] [address] [place], [place], [address] Phone: [phone]   Fax:  [phone]  Occupational Therapy Treatment  Patient Details  Name: Adrian Neal MRN: [id number] Date of Birth: 1965-12-16 Referring Provider:  Anda Kraft, MD  Encounter Date: 09/02/2014      OT End of Session - 09/02/14 1127    Visit Number 5   Number of Visits 15   Authorization Type [employer]   OT Start Time 1015   OT Stop Time 1100   OT Time Calculation (min) 45 min   Activity Tolerance Patient tolerated treatment well   Behavior During Therapy [facility] for tasks assessed/performed      Past Medical History  Diagnosis Date  . Hypertension   . Diabetes mellitus without complication     diagnosed at age 23  . 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 Eye surgery  1990    for retinopathy   . Cataract extraction w/ intraocular lens implant  1994    There were no vitals filed for this visit.  Visit Diagnosis:  Lack of coordination due to stroke  Decreased strength, endurance, and mobility  Cognitive deficits following cerebral infarction      Subjective Assessment - 09/02/14 1110    Subjective  I'm doing well today. I need to work on coordination.    Patient Stated Goals Get my hand to start working better   Currently in Pain? No/denies   Pain Score 0-No pain   Multiple Pain Sites No                      OT Treatments/Exercises (OP) - 09/02/14 0001    ADLs   Overall ADLs Discussed tub/shower transfer safety with patient. Patient states he is currently taking baths due to decreased standing balance. Patient also stated he may try to advance to stand up showers in his tub/shower combo. Encouraged patient to have supervision during this and try "dry simulated" transfers prior to completing the actual wet shower.    Cognitive Exercises   Attention Span Divided Pt counted backwards from 100 by 3's > 70, then by 4's>1. Patient required min verbal cues for accuracy.    Other Cognitive Exercises 1 Patient completed cognitive worksheet set 70 #83 a and b without using calculator and no verbal cues needed for accuracy.    Exercises   Exercises Hand   Hand Exercises   Other Hand Exercises Theraputic activity using gripper at 70lbs to pick up blocks and place them into bowl. During this activity patient counted down from 100 by 3's > 70, then by 4's>1. Min verbal cues needed for this counting.    Fine Motor Coordination   Fine Motor Coordination Nuts and Bolts;Small Pegboard   Small Pegboard Pt stood with supervision to complete small peg design, reaching with LUE in high level open chained environment. Cues to relax RUE during activity, but no physical assistance needed for balance. Patient stood ~15 during this activity, working towards LTG#2   Nuts and Bolts Encouraged use of thumb and pointer finger to keep task close chained                OT Education - 09/02/14 1112    Education provided Yes   Education Details Encouraged patient to screw/unscrew nuts and bolts as a HEP during down time, patient commented that he may  try to organize his tool box.   Person(s) Educated Patient   Methods Explanation   Comprehension Verbalized understanding          OT Short Term Goals - 08/30/14 1310    OT SHORT TERM GOAL #1   Title Independent w/ HEP for coordination (due 09/15/14)   Time 4   Period Weeks   Status Achieved   OT SHORT TERM GOAL #2   Title Pt to simulate tub transfer with tub transfer bench safely and verbalize understanding of acquistion of DME   Time 4   Period Weeks   Status Deferred  Pt does not wish to pursue purchasing DME at this time   OT SHORT TERM GOAL #3   Title Pt to demo sufficient strength and endurance LUE to retrieve/replace 5 lb. object from overhead shelf x 10 reps without rest   Time 4    Period Weeks   Status On-going   OT SHORT TERM GOAL #4   Title Pt to perform complex money exchange with 90% or higher accuracy in prep for work related tasks   Time 4   Period Weeks   Status On-going   OT SHORT TERM GOAL #5   Title Improve LUE functional use as evidenced by performing 42 or greater blocks on Box & Blocks test   Baseline 36   Time 4   Period Weeks   Status On-going           OT Long Term Goals - 08/16/14 1427    OT LONG TERM GOAL #1   Title Improve coordination as evidenced by performing 9 hole peg test in 25 sec. or under (due 10/15/14)   Baseline 30.81 sec.    Time 8   Period Weeks   Status New   OT LONG TERM GOAL #2   Title Pt to perform dynamic standing tasks for light cleaning for 15 minutes or greater w/o rest or LOB    Time 8   Period Weeks   Status New   OT LONG TERM GOAL #3   Title Pt to perform divided attention tasks between physical and cognitive tasks with 90% or greater accuracy in prep for work related tasks   Time 8   Period Weeks   Status New               Plan - 09/02/14 1124    Clinical Impression Statement Pt making good progress towards goals. Continue plan of care for now. Continued discussion regarding DME for tub/shower. Pt states that he is currently taking baths and plans to progress to showers soon. Encouraged supervision for this task, especially if he does not plan to have shower seat. Patient states shower seats are too expensive.    Plan Coordination activities, reaching > overhead shelves for heavier objects using LUE, higher level cognitive activities   OT Home Exercise Plan coordination HEP issued 08/23/14   Consulted and Agree with Plan of Care Patient        Problem List Patient Active Problem List   Diagnosis Date Noted  . Abnormal LFTs 08/12/2014  . Ataxia   . CVA (cerebral infarction) 08/03/2014  . ARF (acute renal failure)   . Insulin dependent diabetes mellitus   . Cerebral thrombosis with cerebral  infarction 07/28/2014  . Intractable nausea and vomiting 07/27/2014  . Vertigo 07/27/2014  . Essential hypertension 07/27/2014  . Type 1 diabetes mellitus 05/29/2010  . Hyperlipidemia 05/29/2010  . Benign essential HTN 05/29/2010  .  PLANTAR FASCIITIS 05/29/2010    Karin Golden, OTR/L [phone] Phone: [phone] 11:33 AM 09/02/2014 Chrys Racer, MS., OTR/L  09/02/2014, 11:28 AM  [facility] [address] [place], [place], [address] Phone: [phone]   Fax:  [phone]

## 2014-09-02 NOTE — Therapy (Signed)
[facility] [address] [place], [place], [address] Phone: [phone]   Fax:  [phone]  Physical Therapy Treatment  Patient Details  Name: Adrian Neal MRN: [id number] Date of Birth: [birth year] Referring Provider:  Anda Kraft, MD  Encounter Date: 09/02/2014      PT End of Session - 09/02/14 1203    Visit Number 5   Number of Visits 16   Date for PT Re-Evaluation 10/17/14   Authorization Type [employer] private, check # of visits after eval   PT Start Time 0930   PT Stop Time 1015   PT Time Calculation (min) 45 min   Equipment Utilized During Treatment Gait belt;Other (comment)  cane   Activity Tolerance Patient tolerated treatment well   Behavior During Therapy [facility] for tasks assessed/performed      Past Medical History  Diagnosis Date  . Hypertension   . Diabetes mellitus without complication     diagnosed at age 18  . 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 Eye surgery  1990    for retinopathy   . Cataract extraction w/ intraocular lens implant  1994    There were no vitals filed for this visit.  Visit Diagnosis:  No diagnosis found.      Subjective Assessment - 09/02/14 1200    Subjective pt reports no pain, no falls and slight dizziness today which he states has remained constant at a level 3 out of 10. pt reports he has been praciticing side stepping along the back of his couch.    Patient Stated Goals To be able to walk without RW.   Currently in Pain? No/denies   Pain Score 0-No pain   Multiple Pain Sites No      Treatments  Gait Training:  4 laps around gym area using cane and CGA, emphasizing head turns, increased step length, and a more narrow BOS.    Neuromuscular Reeducation: -Side stepping 4 laps at counter on compliant surface (red mat) with CGA  -Marching in place X 10 reps alternating LEs with CGA  on compliant surface (red mat) -Coordination exercises: toe taps onto 6 cones standing next to counter for support when necessary. Cone arranged in vertical and horizontal lines for patient to practice balance/weightshifting anterior/posterior and laterally. CGA required for safety.  -balance training standing on 1 inch platform on compliant surface (red mat) with CGA. Head turns in all directions, eyes closed, and UE movements incorporated into activity.   -Static balance training standing at counter on compliant surface (red mat). Pt practiced turning trunk and reaching for pins on counter and then turning trunk again to place pins on opposite side of trunk with feet together without losing balance. Pt more challenged turning to left side.   Therapeutic Exercise:  Lunges with CGA 10 feet using cane for LE strengthening         PT Education - 09/02/14 1201    Education Details pt encouraged to practice side stepping and tandem walking along kitchen counter, as pt reports the back of his couch is too low.    Person(s) Educated Patient   Methods Explanation   Comprehension Verbalized understanding          PT Short Term Goals - 08/17/14 1203    PT SHORT TERM GOAL #1   Title The patient will be indep with HEP for balance, and mobility.  Target date 09/16/2014   Time 4   Period Weeks   PT SHORT TERM GOAL #2   Title The patient will improve Berg score from 30/56 up to 36/56 to demo decreased risk for falls.  Target date 09/16/2014   Time 4   Period Weeks   PT SHORT TERM GOAL #3   Title The patient will ambulate household distances/level surfaces (300 ft) without a device independently.  Target date 09/16/2014   Time 4   Period Weeks   PT SHORT TERM GOAL #4   Title The patient will increase walking speed from 1.2 ft/sec up to 1.8 ft/sec to demo decreased risk for falls (with least restrictive device).  Target date 09/16/2014   Time 4   Period Weeks   PT SHORT TERM GOAL #5   Title The  patient will negotiate 4 steps x 3 reps with one handrail and reciprocal pattern modified indep. Target date 09/16/2014   Time 4   Period Weeks           PT Long Term Goals - 08/17/14 1206    PT LONG TERM GOAL #1   Title The patient will be indep with progression of HEP. Target date 10/17/2014   Time 8   Period Weeks   PT LONG TERM GOAL #2   Title The patient will improve stroke impact scale mobility by 15% (baseline 64%) to demo improved self perception of mobility.  Target date 10/17/2014   Time 8   Period Weeks   PT LONG TERM GOAL #3   Title The patient will improve Berg balance score from 30/56 up to 44/56 to demo decreasing risk for falls.  Target date 10/17/2014   Time 8   Period Weeks   PT LONG TERM GOAL #4   Title The patient will improve gait speed from 1.2 ft/sec up to 2.6 ft/sec to demo transition to "full community ambulator" classification of gait.  Target date 10/17/2014   Time 8   Period Weeks   PT LONG TERM GOAL #5   Title The patient will verbalize understanding of stroke risk factors and warning signs.  Target date 10/17/2014   Time 8   Period Weeks   Additional Long Term Goals   Additional Long Term Goals Yes   PT LONG TERM GOAL #6   Title The patient will negotiate level and unlevel community surfaces with least restrictive assistive device x 1200 ft modified indep.  Target date 10/17/2014   Time 8   Period Weeks            Plan - 09/02/14 1204    Clinical Impression Statement pt tolerated treatment well and states that he believes the exercises during session are helping with his balance. pt continues to be challenged with narrow BOS and coordiation exercises. Left lateral sway still present during treatment session.    Pt will benefit from skilled therapeutic intervention in order to improve on the following deficits Abnormal gait;Difficulty walking;Postural dysfunction;Decreased balance;Decreased activity tolerance;Decreased mobility   Rehab Potential  Good   PT Frequency 2x / week   PT Duration 8 weeks   PT Treatment/Interventions Therapeutic activities;Patient/family education;Therapeutic exercise;Manual techniques;Balance training;Gait training;Neuromuscular re-education;Stair training;Functional mobility training   PT Next Visit Plan dynamic gait using cabe emphasizing head turns,  balance training, progress HEP as able, stair training, coordination activities   Consulted and Agree with Plan of Care Patient        Problem List Patient Active Problem List   Diagnosis Date  Noted  . Abnormal LFTs 08/12/2014  . Ataxia   . CVA (cerebral infarction) 08/03/2014  . ARF (acute renal failure)   . Insulin dependent diabetes mellitus   . 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 Intractable nausea and vomiting 07/27/2014  . Vertigo 07/27/2014  . Essential hypertension 07/27/2014  . Type 1 diabetes mellitus 05/29/2010  . Hyperlipidemia 05/29/2010  . Benign essential HTN 05/29/2010  . PLANTAR FASCIITIS 05/29/2010    Fanny Dance 09/02/2014, 12:08 PM  [facility] [address] [place], [place], [address] Phone: [phone]   Fax:  [phone]

## 2014-09-06 ENCOUNTER — Ambulatory Visit: Payer: 59 | Admitting: Rehabilitative and Restorative Service Providers"

## 2014-09-06 ENCOUNTER — Ambulatory Visit: Payer: 59 | Admitting: Occupational Therapy

## 2014-09-06 ENCOUNTER — Encounter: Payer: Self-pay | Admitting: Occupational Therapy

## 2014-09-06 DIAGNOSIS — IMO0002 Reserved for concepts with insufficient information to code with codable children: Secondary | ICD-10-CM

## 2014-09-06 DIAGNOSIS — R531 Weakness: Secondary | ICD-10-CM

## 2014-09-06 DIAGNOSIS — R269 Unspecified abnormalities of gait and mobility: Secondary | ICD-10-CM

## 2014-09-06 DIAGNOSIS — H814 Vertigo of central origin: Secondary | ICD-10-CM

## 2014-09-06 DIAGNOSIS — Z7409 Other reduced mobility: Secondary | ICD-10-CM

## 2014-09-06 DIAGNOSIS — I6980 Unspecified sequelae of other cerebrovascular disease: Secondary | ICD-10-CM | POA: Diagnosis not present

## 2014-09-06 DIAGNOSIS — I69319 Unspecified symptoms and signs involving cognitive functions following cerebral infarction: Secondary | ICD-10-CM

## 2014-09-06 DIAGNOSIS — R6889 Other general symptoms and signs: Secondary | ICD-10-CM

## 2014-09-06 NOTE — Therapy (Signed)
[facility] [address] [place], [place], [address] Phone: [phone]   Fax:  [phone]  Physical Therapy Treatment  Patient Details  Name: Adrian Neal MRN: [id number] Date of Birth: 02/15/1966 Referring Provider:  Anda Kraft, MD  Encounter Date: 09/06/2014      PT End of Session - 09/06/14 1335    Visit Number 6   Number of Visits 16   Date for PT Re-Evaluation 10/17/14   Authorization Type [employer] private, check # of visits after eval   PT Start Time [id number]   PT Stop Time 1233   PT Time Calculation (min) 48 min   Equipment Utilized During Treatment Gait belt  SPC   Activity Tolerance Patient tolerated treatment well   Behavior During Therapy [facility] for tasks assessed/performed      Past Medical History  Diagnosis Date  . Hypertension   . Diabetes mellitus without complication     diagnosed at age 53  . 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 Eye surgery  1990    for retinopathy   . Cataract extraction w/ intraocular lens implant  1994    There were no vitals filed for this visit.  Visit Diagnosis:  Lack of coordination due to stroke  Decreased strength, endurance, and mobility  Abnormality of gait  Vertigo, central origin, unspecified laterality      Subjective Assessment - 09/06/14 1330    Subjective pt presents to therapy stating no recent falls, but upon further questioning pt states when completing his HEP and other activities sometimes he has uncontrolled descents to his couch. pt states he is compliant with his HEP and walks on his treadmill at home with arm supports, yet he has not been doing his head turn exercises in his HEP  or any exercises that bring on his dizziness symptoms.     Patient Stated Goals To be able to walk without RW.   Currently in Pain? No/denies   Pain Score 0-No pain        Treatment    Gait training: -4 minutes on treadmill set on 1.1 miles/ hour using both UE bars for support/stability  -210 feet using SPC and w/ CGA outside on uneven surfaces. Pt instructed to take larger, even steps with gait and to plant cane firmly on ground with steps.  Gait x 20 feet without device with min A with greater instability and slower speed   Neuro Re-Education:  -Seated at edge of mat: bending down to retrieve cones and then put them back down using 6 cones x 5 reps. Reported dizziness increase to a level of 5/10.  -Corner balance exercises with CGA on blue mat encouraged equal weight bearing through bilateral feet and shifting weight to the right: with feet together and eyes closed, head turns in horizontal, vertical and diagonal directions. 10 reps. with feet slighted apart with eyes closed, feet together with eyes closed and head turns. 10 reps.  -Alternating UE punches with trunk rotations. 10 reps adding head turns to patient tolerance        PT Short Term Goals - 08/17/14 1203    PT SHORT TERM GOAL #1   Title The patient will be indep with HEP for balance, and mobility.  Target date 09/16/2014   Time 4   Period Weeks   PT SHORT TERM GOAL #2   Title The patient will  improve Berg score from 30/56 up to 36/56 to demo decreased risk for falls.  Target date 09/16/2014   Time 4   Period Weeks   PT SHORT TERM GOAL #3   Title The patient will ambulate household distances/level surfaces (300 ft) without a device independently.  Target date 09/16/2014   Time 4   Period Weeks   PT SHORT TERM GOAL #4   Title The patient will increase walking speed from 1.2 ft/sec up to 1.8 ft/sec to demo decreased risk for falls (with least restrictive device).  Target date 09/16/2014   Time 4   Period Weeks   PT SHORT TERM GOAL #5   Title The patient will negotiate 4 steps x 3 reps with one handrail and reciprocal pattern modified indep. Target date 09/16/2014   Time 4   Period Weeks            PT Long Term Goals - 08/17/14 1206    PT LONG TERM GOAL #1   Title The patient will be indep with progression of HEP. Target date 10/17/2014   Time 8   Period Weeks   PT LONG TERM GOAL #2   Title The patient will improve stroke impact scale mobility by 15% (baseline 64%) to demo improved self perception of mobility.  Target date 10/17/2014   Time 8   Period Weeks   PT LONG TERM GOAL #3   Title The patient will improve Berg balance score from 30/56 up to 44/56 to demo decreasing risk for falls.  Target date 10/17/2014   Time 8   Period Weeks   PT LONG TERM GOAL #4   Title The patient will improve gait speed from 1.2 ft/sec up to 2.6 ft/sec to demo transition to "full community ambulator" classification of gait.  Target date 10/17/2014   Time 8   Period Weeks   PT LONG TERM GOAL #5   Title The patient will verbalize understanding of stroke risk factors and warning signs.  Target date 10/17/2014   Time 8   Period Weeks   Additional Long Term Goals   Additional Long Term Goals Yes   PT LONG TERM GOAL #6   Title The patient will negotiate level and unlevel community surfaces with least restrictive assistive device x 1200 ft modified indep.  Target date 10/17/2014   Time 8   Period Weeks               Plan - 09/06/14 1336    Clinical Impression Statement Pt is progressing towards goals and responded well to treatment today. Session focused on balance activities and postural control with dizziness, as pt is still significantly influenced by left lateral sway during gait. pt encouraged at end of session to continue HEP habituation exercises  consistently  at home.   Pt will benefit from skilled therapeutic intervention in order to improve on the following deficits Abnormal gait;Difficulty walking;Postural dysfunction;Decreased balance;Decreased activity tolerance;Decreased mobility   Rehab Potential Good   PT Frequency 2x / week   PT Duration 8 weeks   PT Treatment/Interventions  Therapeutic activities;Patient/family education;Therapeutic exercise;Manual techniques;Balance training;Gait training;Neuromuscular re-education;Stair training;Functional mobility training   PT Next Visit Plan continue focusing on balance activities w/head turns on compliant surfaces (emphasis with feet together and eyes closed), dynamic gait with SPC, stair training, coordination activities   Consulted and Agree with Plan of Care Patient        Problem List Patient Active Problem List   Diagnosis Date Noted  .  Abnormal LFTs 08/12/2014  . Ataxia   . CVA (cerebral infarction) 08/03/2014  . ARF (acute renal failure)   . Insulin dependent diabetes mellitus   . 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 Intractable nausea and vomiting 07/27/2014  . Vertigo 07/27/2014  . Essential hypertension 07/27/2014  . Type 1 diabetes mellitus 05/29/2010  . Hyperlipidemia 05/29/2010  . Benign essential HTN 05/29/2010  . PLANTAR FASCIITIS 05/29/2010   This entire session was performed under direct supervision and direction of a licensed therapist/therapist assistant . I have personally read, edited and approve of the note as written. WEAVER,CHRISTINA, PT   Fanny Dance 09/06/2014, 1:54 PM  [facility] [address] [place], [place], [address] Phone: [phone]   Fax:  [phone]

## 2014-09-06 NOTE — Therapy (Signed)
[facility] [address] [place], [place], [address] Phone: [phone]   Fax:  [phone]  Occupational Therapy Treatment  Patient Details  Name: Adrian Neal MRN: [id number] Date of Birth: Aug 15, 1965 Referring Provider:  Anda Kraft, MD  Encounter Date: 09/06/2014      OT End of Session - 09/06/14 1211    Visit Number 6   Number of Visits 15   Date for OT Re-Evaluation 10/15/14   Authorization Type [employer]   OT Start Time 1105   OT Stop Time 1145   OT Time Calculation (min) 40 min   Activity Tolerance Patient tolerated treatment well      Past Medical History  Diagnosis Date  . Hypertension   . Diabetes mellitus without complication     diagnosed at age 69  . 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 Eye surgery  1990    for retinopathy   . Cataract extraction w/ intraocular lens implant  1994    There were no vitals filed for this visit.  Visit Diagnosis:  Lack of coordination due to stroke  Decreased strength, endurance, and mobility  Cognitive deficits following cerebral infarction      Subjective Assessment - 09/06/14 1120    Patient Stated Goals Get my hand to start working better   Currently in Pain? No/denies                      OT Treatments/Exercises (OP) - 09/06/14 1123    Cognitive Exercises   Financial Management Change-Making money exchange worksheet with only 1 small error (off by 10 cents). Completed entire task in 5.38 minutes   Fine Motor Coordination   Fine Motor Coordination Small Pegboard   Small Pegboard vertical surface Lt hand to place pegs w/o drops for coordination, reaching, strength/endurance LUE; pt with fatigue near end of task. Then, after short rest break, Pt removed pegs 3 at a time for in hand manipulation with min drops (2 lb. weight on LUE)   Functional Reaching  Activities   High Level Placing 5 lb. object on high shelf x 10 reps without rest. Met STG #3. Pt also placing small pegs in pegboard vertical surface high range shoulder flexion required with 2 lb. weight on LUE.                   OT Short Term Goals - 09/06/14 1129    OT SHORT TERM GOAL #1   Title Independent w/ HEP for coordination (due 09/15/14)   Time 4   Period Weeks   Status Achieved   OT SHORT TERM GOAL #2   Title Pt to simulate tub transfer with tub transfer bench safely and verbalize understanding of acquistion of DME   Time 4   Period Weeks   Status Deferred  Pt does not wish to pursue purchasing DME at this time   OT SHORT TERM GOAL #3   Title Pt to demo sufficient strength and endurance LUE to retrieve/replace 5 lb. object from overhead shelf x 10 reps without rest   Time 4   Period Weeks   Status Achieved  as of 09/06/14   OT SHORT TERM GOAL #4   Title Pt to perform complex money exchange with 90% or higher accuracy in prep for work related tasks   Time 4   Period Weeks  Status Achieved  as of 09/06/14   OT SHORT TERM GOAL #5   Title Improve LUE functional use as evidenced by performing 42 or greater blocks on Box & Blocks test   Baseline 36   Time 4   Period Weeks   Status On-going           OT Long Term Goals - 08/16/14 1427    OT LONG TERM GOAL #1   Title Improve coordination as evidenced by performing 9 hole peg test in 25 sec. or under (due 10/15/14)   Baseline 30.81 sec.    Time 8   Period Weeks   Status New   OT LONG TERM GOAL #2   Title Pt to perform dynamic standing tasks for light cleaning for 15 minutes or greater w/o rest or LOB    Time 8   Period Weeks   Status New   OT LONG TERM GOAL #3   Title Pt to perform divided attention tasks between physical and cognitive tasks with 90% or greater accuracy in prep for work related tasks   Time 8   Period Weeks   Status New               Plan - 09/06/14 1215    Clinical  Impression Statement Pt met STG's #3 and #4. Pt with increased strength/endurance LUE and increased cognition for complex math skills   Plan assess STG #5, continue coordination, strength/endurance LUE, 4-5 digit math subtraction/addition with decimals in prep for work tasks   [employer] and Agree with Plan of Care Patient        Problem List Patient Active Problem List   Diagnosis Date Noted  . Abnormal LFTs 08/12/2014  . Ataxia   . CVA (cerebral infarction) 08/03/2014  . ARF (acute renal failure)   . Insulin dependent diabetes mellitus   . 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 Intractable nausea and vomiting 07/27/2014  . Vertigo 07/27/2014  . Essential hypertension 07/27/2014  . Type 1 diabetes mellitus 05/29/2010  . Hyperlipidemia 05/29/2010  . Benign essential HTN 05/29/2010  . PLANTAR FASCIITIS 05/29/2010    Carey Bullocks, OTR/L 09/06/2014, 12:18 PM  [facility] [address] [place], [place], [address] Phone: [phone]   Fax:  [phone]

## 2014-09-09 ENCOUNTER — Encounter: Payer: Self-pay | Admitting: Physical Therapy

## 2014-09-09 ENCOUNTER — Ambulatory Visit: Payer: 59 | Admitting: Physical Therapy

## 2014-09-09 ENCOUNTER — Ambulatory Visit: Payer: 59 | Admitting: Occupational Therapy

## 2014-09-09 DIAGNOSIS — H814 Vertigo of central origin: Secondary | ICD-10-CM

## 2014-09-09 DIAGNOSIS — Z7409 Other reduced mobility: Secondary | ICD-10-CM

## 2014-09-09 DIAGNOSIS — I6980 Unspecified sequelae of other cerebrovascular disease: Secondary | ICD-10-CM | POA: Diagnosis not present

## 2014-09-09 DIAGNOSIS — R531 Weakness: Secondary | ICD-10-CM

## 2014-09-09 DIAGNOSIS — R6889 Other general symptoms and signs: Secondary | ICD-10-CM

## 2014-09-09 DIAGNOSIS — IMO0002 Reserved for concepts with insufficient information to code with codable children: Secondary | ICD-10-CM

## 2014-09-09 DIAGNOSIS — I69319 Unspecified symptoms and signs involving cognitive functions following cerebral infarction: Secondary | ICD-10-CM

## 2014-09-09 NOTE — Therapy (Addendum)
[facility] [address] [place], [place], [address] Phone: [phone]   Fax:  [phone]  Occupational Therapy Treatment  Patient Details  Name: Adrian Neal MRN: [id number] Date of Birth: [birth year] Referring Provider:  Anda Kraft, MD  Encounter Date: 09/09/2014      OT End of Session - 09/09/14 0947    Visit Number 7   Number of Visits 15   Date for OT Re-Evaluation 10/15/14   Authorization Type UHC   OT Start Time [phone]   OT Stop Time 1015   OT Time Calculation (min) 39 min   Activity Tolerance Patient tolerated treatment well   Behavior During Therapy [facility] for tasks assessed/performed      Past Medical History  Diagnosis Date  . Hypertension   . Diabetes mellitus without complication     diagnosed at age 89  . 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 Eye surgery  1990    for retinopathy   . Cataract extraction w/ intraocular lens implant  1994    There were no vitals filed for this visit.  Visit Diagnosis:  Cognitive deficits following cerebral infarction  Lack of coordination due to stroke  Decreased strength, endurance, and mobility      Subjective Assessment - 09/09/14 0946    Subjective  Pt reports he's doing well   Patient is accompained by: Family member   Patient Stated Goals Get my hand to start working better   Currently in Pain? No/denies        Treatment: Self care: Moderate complex addition with decimals(3-5 digits), Pt missed only 2 items, 93 % correct. Theraputic activities:  [employer], with pt retrieving pegs from elevated surface then placing in pegboard, 2 lbs weight on left wrist, min difficulty for increased fine motor coordination.  Removing pegs with emphasis on in hand manipulation/ translation, 2 lbs weight on left wrist , min difficulty , v.c. To avoid compensation. Arm bike x  5 mins level 5 for conditioning, pt able to maintain 40 RPM                        OT Short Term Goals - 09/09/14 0949    OT SHORT TERM GOAL #1   Title Independent w/ HEP for coordination (due 09/15/14)   Time 4   Period Weeks   Status Achieved   OT SHORT TERM GOAL #2   Title Pt to simulate tub transfer with tub transfer bench safely and verbalize understanding of acquistion of DME   Time 4   Period Weeks   Status Deferred  Pt does not wish to pursue purchasing DME at this time   OT SHORT TERM GOAL #3   Title Pt to demo sufficient strength and endurance LUE to retrieve/replace 5 lb. object from overhead shelf x 10 reps without rest   Time 4   Period Weeks   Status Achieved  as of 09/06/14   OT SHORT TERM GOAL #4   Title Pt to perform complex money exchange with 90% or higher accuracy in prep for work related tasks   Time 4   Period Weeks   Status Achieved  as of 09/06/14   OT SHORT TERM GOAL #5   Title Improve LUE functional use as evidenced by performing 42 or greater blocks on Box & Blocks test   Baseline met  09/09/14-49 blocks   Time 4   Period Weeks   Status Achieved           OT Long Term Goals - 09/09/14 0949    OT LONG TERM GOAL #1   Title Improve coordination as evidenced by performing 9 hole peg test in 25 sec. or under (due 10/15/14)   Baseline 30.81 sec.    Time 8   Period Weeks   Status New   OT LONG TERM GOAL #2   Title Pt to perform dynamic standing tasks for light cleaning for 15 minutes or greater w/o rest or LOB    Time 8   Period Weeks   Status New   OT LONG TERM GOAL #3   Title Pt to perform divided attention tasks between physical and cognitive tasks with 90% or greater accuracy in prep for work related tasks   Time 8   Period Weeks   Status New               Problem List Patient Active Problem List   Diagnosis Date Noted  . Abnormal LFTs 08/12/2014  . Ataxia   . CVA (cerebral infarction) 08/03/2014  . ARF  (acute renal failure)   . Insulin dependent diabetes mellitus   . 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 Intractable nausea and vomiting 07/27/2014  . Vertigo 07/27/2014  . Essential hypertension 07/27/2014  . Type 1 diabetes mellitus 05/29/2010  . Hyperlipidemia 05/29/2010  . Benign essential HTN 05/29/2010  . PLANTAR FASCIITIS 05/29/2010    Alyvia Derk 09/09/2014, 10:19 AM Theone Murdoch, OTR/L [phone] Phone: [phone] 10:19 AM 09/09/2014 [facility] [address] [place], [place], [address] Phone: [phone]   Fax:  [phone]

## 2014-09-09 NOTE — Therapy (Signed)
[facility] [address] [place], [place], [address] Phone: [phone]   Fax:  [phone]  Physical Therapy Treatment  Patient Details  Name: Adrian Neal MRN: [id number] Date of Birth: April 17, 1966 Referring Provider:  Anda Kraft, MD  Encounter Date: 09/09/2014      PT End of Session - 09/09/14 1019    Visit Number 7   Number of Visits 16   Date for PT Re-Evaluation 10/17/14   Authorization Type [employer] private, check # of visits after eval   PT Start Time 1015   PT Stop Time 1100   PT Time Calculation (min) 45 min   Equipment Utilized During Treatment Gait  belt   Activity Tolerance Patient tolerated treatment well   Behavior During Therapy [facility] for tasks assessed/performed      Past Medical History  Diagnosis Date  . Hypertension   . Diabetes mellitus without complication     diagnosed at age 22  . 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 Eye surgery  1990    for retinopathy   . Cataract extraction w/ intraocular lens implant  1994    There were no vitals filed for this visit.  Visit Diagnosis:  Lack of coordination due to stroke  Decreased strength, endurance, and mobility  Vertigo, central origin, unspecified laterality      Subjective Assessment - 09/09/14 1017    Subjective No new complaints. No pain or falls to report. Has started doing habituation ex's at home.   Currently in Pain? No/denies      Treatment: Neuro Re-ed Seated at edge of mat: bending over to retrieve 6 cones and then putting them back down x 5 reps.   In corner: On Air ex with narrow base of support - with eyes open, then closed, head nods/shakes/diagonals both ways  Turning full circles (360*) x 5 each way, 3 reps with rest in between to allow dizziness to resolve.  Standing next to mat: with large ball, full body diagonals: reaching down  to floor on one side and up toward ceiling on opposite side x 5 reps each way, 3 cycles  On ramp: performed both facing up and down ramp- wide base of support, then narrow base of support, eyes open and then eyes closed, head nods/shakes/diagonals both ways. Up to min assist with wide base of support and mod assist with narrow base of support and eyes closed.         PT Short Term Goals - 08/17/14 1203    PT SHORT TERM GOAL #1   Title The patient will be indep with HEP for balance, and mobility.  Target date 09/16/2014   Time 4   Period Weeks   PT SHORT TERM GOAL #2   Title The patient will improve Berg score from 30/56 up to 36/56 to demo decreased risk for falls.  Target date 09/16/2014   Time 4   Period Weeks   PT SHORT TERM GOAL #3   Title The patient will ambulate household distances/level surfaces (300 ft) without a device independently.  Target date 09/16/2014   Time 4   Period Weeks   PT SHORT TERM GOAL #4   Title The patient will increase walking speed from 1.2 ft/sec up to 1.8 ft/sec to demo decreased risk for falls (with least restrictive device).  Target date 09/16/2014   Time 4   Period  Weeks   PT SHORT TERM GOAL #5   Title The patient will negotiate 4 steps x 3 reps with one handrail and reciprocal pattern modified indep. Target date 09/16/2014   Time 4   Period Weeks           PT Long Term Goals - 08/17/14 1206    PT LONG TERM GOAL #1   Title The patient will be indep with progression of HEP. Target date 10/17/2014   Time 8   Period Weeks   PT LONG TERM GOAL #2   Title The patient will improve stroke impact scale mobility by 15% (baseline 64%) to demo improved self perception of mobility.  Target date 10/17/2014   Time 8   Period Weeks   PT LONG TERM GOAL #3   Title The patient will improve Berg balance score from 30/56 up to 44/56 to demo decreasing risk for falls.  Target date 10/17/2014   Time 8   Period Weeks   PT LONG TERM GOAL #4   Title The patient will  improve gait speed from 1.2 ft/sec up to 2.6 ft/sec to demo transition to "full community ambulator" classification of gait.  Target date 10/17/2014   Time 8   Period Weeks   PT LONG TERM GOAL #5   Title The patient will verbalize understanding of stroke risk factors and warning signs.  Target date 10/17/2014   Time 8   Period Weeks   Additional Long Term Goals   Additional Long Term Goals Yes   PT LONG TERM GOAL #6   Title The patient will negotiate level and unlevel community surfaces with least restrictive assistive device x 1200 ft modified indep.  Target date 10/17/2014   Time 8   Period Weeks           Plan - 09/09/14 1020    Clinical Impression Statement Focused on balance today and activities to provoke dizziness. Pt with most challlenge in narrow positions and on compliant surfaces. Making progress toward goals.   Pt will benefit from skilled therapeutic intervention in order to improve on the following deficits Abnormal gait;Difficulty walking;Postural dysfunction;Decreased balance;Decreased activity tolerance;Decreased mobility   Rehab Potential Good   PT Frequency 2x / week   PT Duration 8 weeks   PT Treatment/Interventions Therapeutic activities;Patient/family education;Therapeutic exercise;Manual techniques;Balance training;Gait training;Neuromuscular re-education;Stair training;Functional mobility training   PT Next Visit Plan continue focusing on balance activities w/head turns on compliant surfaces (emphasis with feet together and eyes closed), dynamic gait with SPC, stair training, coordination activities   Consulted and Agree with Plan of Care Patient        Problem List Patient Active Problem List   Diagnosis Date Noted  . Abnormal LFTs 08/12/2014  . Ataxia   . CVA (cerebral infarction) 08/03/2014  . ARF (acute renal failure)   . Insulin dependent diabetes mellitus   . 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 Intractable nausea and vomiting  07/27/2014  . Vertigo 07/27/2014  . Essential hypertension 07/27/2014  . Type 1 diabetes mellitus 05/29/2010  . Hyperlipidemia 05/29/2010  . Benign essential HTN 05/29/2010  . PLANTAR FASCIITIS 05/29/2010    Willow Ora 09/09/2014, 7:44 PM  Willow Ora, PTA, [facility] [address], [place], [place] [address] [phone] 09/09/2014, 7:44 PM

## 2014-09-13 ENCOUNTER — Ambulatory Visit: Payer: 59 | Admitting: Physical Therapy

## 2014-09-13 ENCOUNTER — Encounter: Payer: Self-pay | Admitting: Occupational Therapy

## 2014-09-13 ENCOUNTER — Ambulatory Visit: Payer: 59 | Admitting: Occupational Therapy

## 2014-09-13 DIAGNOSIS — IMO0002 Reserved for concepts with insufficient information to code with codable children: Secondary | ICD-10-CM

## 2014-09-13 DIAGNOSIS — R531 Weakness: Secondary | ICD-10-CM

## 2014-09-13 DIAGNOSIS — Z7409 Other reduced mobility: Secondary | ICD-10-CM

## 2014-09-13 DIAGNOSIS — I69319 Unspecified symptoms and signs involving cognitive functions following cerebral infarction: Secondary | ICD-10-CM

## 2014-09-13 DIAGNOSIS — H814 Vertigo of central origin: Secondary | ICD-10-CM

## 2014-09-13 DIAGNOSIS — I6980 Unspecified sequelae of other cerebrovascular disease: Secondary | ICD-10-CM | POA: Diagnosis not present

## 2014-09-13 DIAGNOSIS — R269 Unspecified abnormalities of gait and mobility: Secondary | ICD-10-CM

## 2014-09-13 NOTE — Therapy (Signed)
[facility] [address] [place], [place], [address] Phone: [phone]   Fax:  [phone]  Occupational Therapy Treatment  Patient Details  Name: Adrian Neal MRN: [id number] Date of Birth: May 11, 1965 Referring Provider:  Anda Kraft, MD  Encounter Date: 09/13/2014      OT End of Session - 09/13/14 1306    Visit Number 8   Number of Visits 15   Date for OT Re-Evaluation 10/15/14   Authorization Type U[employer]   OT Start Time 0935   OT Stop Time 1020   OT Time Calculation (min) 45 min   Activity Tolerance Patient tolerated treatment well      Past Medical History  Diagnosis Date  . Hypertension   . Diabetes mellitus without complication     diagnosed at age 79  . 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 Eye surgery  1990    for retinopathy   . Cataract extraction w/ intraocular lens implant  1994    There were no vitals filed for this visit.  Visit Diagnosis:  Lack of coordination due to stroke  Cognitive deficits following cerebral infarction      Subjective Assessment - 09/13/14 0942    Subjective  I'm getting better   Patient Stated Goals Get my hand to start working better   Currently in Pain? No/denies                      OT Treatments/Exercises (OP) - 09/13/14 0938    Cognitive Exercises   Problem Solving Filling in tasks to do at appropriate times on hypothetical schedule and answering corresponding questions to schedule (alternating b/t fine motor task): Pt filled out schedule appropriately with only 1 error (placing in wrong time), and 2 tasks that could have been done at better times. Answered corresponding questions with 8/10 correct (1 incorrect due to making 1 error filling in schedule).    Sequencing Skills sequencing "dog sitting" task placing the 12 steps in order (alternating b/t fine motor  task): Pt made 4 errors out of sequence from 12 steps.    Attention Span Alternating between fine motor coordination task and cognitive skills tasks (organization/problem solving and sequencing tasks). Pt asked to switch tasks every 3 minutes (Pt forgot occasionally to switch at 3 minute mark and required min cues)   Fine Motor Coordination   In Hand Manipulation Training Placing 5 small pegs at a time for fingertips to palm and palm to fingertips translation to copy small peg design, alternating every 3 minutes between cognitive tasks (see cognition section). Pt with min drops and difficulty with translation from palm to fingertips. Pt copied design with one 1 omission                  OT Short Term Goals - 09/09/14 0949    OT SHORT TERM GOAL #1   Title Independent w/ HEP for coordination (due 09/15/14)   Time 4   Period Weeks   Status Achieved   OT SHORT TERM GOAL #2   Title Pt to simulate tub transfer with tub transfer bench safely and verbalize understanding of acquistion of DME   Time 4   Period Weeks   Status Deferred  Pt does not wish to pursue purchasing DME at this time   OT SHORT TERM GOAL #3   Title Pt to demo sufficient  strength and endurance LUE to retrieve/replace 5 lb. object from overhead shelf x 10 reps without rest   Time 4   Period Weeks   Status Achieved  as of 09/06/14   OT SHORT TERM GOAL #4   Title Pt to perform complex money exchange with 90% or higher accuracy in prep for work related tasks   Time 4   Period Weeks   Status Achieved  as of 09/06/14   OT SHORT TERM GOAL #5   Title Improve LUE functional use as evidenced by performing 42 or greater blocks on Box & Blocks test   Baseline met 09/09/14-49 blocks   Time 4   Period Weeks   Status Achieved           OT Long Term Goals - 09/09/14 0949    OT LONG TERM GOAL #1   Title Improve coordination as evidenced by performing 9 hole peg test in 25 sec. or under (due 10/15/14)   Baseline 30.81 sec.     Time 8   Period Weeks   Status New   OT LONG TERM GOAL #2   Title Pt to perform dynamic standing tasks for light cleaning for 15 minutes or greater w/o rest or LOB    Time 8   Period Weeks   Status New   OT LONG TERM GOAL #3   Title Pt to perform divided attention tasks between physical and cognitive tasks with 90% or greater accuracy in prep for work related tasks   Time 8   Period Weeks   Status New               Plan - 09/13/14 1307    Clinical Impression Statement Pt met STG #5 last session. Pt with improved accuracy in cognitive tasks, however has decrease in accuracy with divided attention tasks.    Plan complex planning tasks (refer to handout), dynamic standing with LUE reaching/strengthening, UBE with increased time/resistance if time allows    Consulted and Agree with Plan of Care Patient        Problem List Patient Active Problem List   Diagnosis Date Noted  . Abnormal LFTs 08/12/2014  . Ataxia   . CVA (cerebral infarction) 08/03/2014  . ARF (acute renal failure)   . Insulin dependent diabetes mellitus   . 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 Intractable nausea and vomiting 07/27/2014  . Vertigo 07/27/2014  . Essential hypertension 07/27/2014  . Type 1 diabetes mellitus 05/29/2010  . Hyperlipidemia 05/29/2010  . Benign essential HTN 05/29/2010  . PLANTAR FASCIITIS 05/29/2010    Carey Bullocks, OTR/L 09/13/2014, 1:10 PM  [facility] [address] [place], [place], [address] Phone: [phone]   Fax:  [phone]

## 2014-09-13 NOTE — Therapy (Signed)
[facility] [address] [place], [place], [address] Phone: [phone]   Fax:  [phone]  Physical Therapy Treatment  Patient Details  Name: Adrian Neal MRN: [id number] Date of Birth: April 14, 1966 Referring Provider:  Anda Kraft, MD  Encounter Date: 09/13/2014      PT End of Session - 09/13/14 1324    Visit Number 8   Number of Visits 16   Date for PT Re-Evaluation 10/17/14   Authorization Type UHC private, check # of visits after eval   PT Start Time 1018   PT Stop Time 1103   PT Time Calculation (min) 45 min   Equipment Utilized During Treatment Gait belt   Activity Tolerance Patient tolerated treatment well   Behavior During Therapy [facility] for tasks assessed/performed      Past Medical History  Diagnosis Date  . Hypertension   . Diabetes mellitus without complication     diagnosed at age 36  . 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 Eye surgery  1990    for retinopathy   . Cataract extraction w/ intraocular lens implant  1994    There were no vitals filed for this visit.  Visit Diagnosis:  Lack of coordination due to stroke  Decreased strength, endurance, and mobility  Vertigo, central origin, unspecified laterality  Abnormality of gait      Subjective Assessment - 09/13/14 1317    Subjective No new complaints. No pain or falls to report. pt is continuing habituation ex's at home and is compliant with current HEP. pt states eyes closed and feet together exercises are still challenging at home.   Patient Stated Goals To be able to walk without RW.        Treatment  Gait Training: -dynamic gait training with SPC and CGA: 3 laps of braided gait x 30 feet, 3 laps of gait training through narrow obstacle course without knocking over cones  -gait training in parallel bars without AD and CGA. Pt instructed on  adequate step length and cues for heel strike and toe off. 6 laps.   Neuro Re-Education:  -Double layered red mat (compliant surface) with narrow base of support- with eyes open, then closed, head nods/shakes/diagonals both ways. CGA. -Double layered red mat (compliant surface) with narrow base of support- pt instructed to practice coordination and balance by alternating tapping on 3 upside down cups in front of him.  -In Corner with SBA: Turning full circles (360*) x 5 each way, 3 reps with rest in between to allow dizziness to resolve. -Standing next to mat: with large ball, full body diagonals: reaching down to floor on one side and up toward ceiling on opposite side x 5 reps each way, 3 cycles -On ramp: performed both facing up and down ramp- narrow base of support, eyes open and then eyes closed, head nods/shakes/diagonals both ways. CGA with stance during this activity, though it should be noted that pt is very unsteady and sometimes requires Min to Mod assist to correct for LOB during activity.  -On ramp: performed both facing up and down ramp- pt instructed to maintain balance with external perturbations applied while maintaining CGA.         PT Short Term Goals - 08/17/14 1203    PT SHORT TERM GOAL #1   Title The patient will be indep with HEP for balance, and mobility.  Target date  09/16/2014   Time 4   Period Weeks   PT SHORT TERM GOAL #2   Title The patient will improve Berg score from 30/56 up to 36/56 to demo decreased risk for falls.  Target date 09/16/2014   Time 4   Period Weeks   PT SHORT TERM GOAL #3   Title The patient will ambulate household distances/level surfaces (300 ft) without a device independently.  Target date 09/16/2014   Time 4   Period Weeks   PT SHORT TERM GOAL #4   Title The patient will increase walking speed from 1.2 ft/sec up to 1.8 ft/sec to demo decreased risk for falls (with least restrictive device).  Target date 09/16/2014   Time 4   Period Weeks    PT SHORT TERM GOAL #5   Title The patient will negotiate 4 steps x 3 reps with one handrail and reciprocal pattern modified indep. Target date 09/16/2014   Time 4   Period Weeks           PT Long Term Goals - 08/17/14 1206    PT LONG TERM GOAL #1   Title The patient will be indep with progression of HEP. Target date 10/17/2014   Time 8   Period Weeks   PT LONG TERM GOAL #2   Title The patient will improve stroke impact scale mobility by 15% (baseline 64%) to demo improved self perception of mobility.  Target date 10/17/2014   Time 8   Period Weeks   PT LONG TERM GOAL #3   Title The patient will improve Berg balance score from 30/56 up to 44/56 to demo decreasing risk for falls.  Target date 10/17/2014   Time 8   Period Weeks   PT LONG TERM GOAL #4   Title The patient will improve gait speed from 1.2 ft/sec up to 2.6 ft/sec to demo transition to "full community ambulator" classification of gait.  Target date 10/17/2014   Time 8   Period Weeks   PT LONG TERM GOAL #5   Title The patient will verbalize understanding of stroke risk factors and warning signs.  Target date 10/17/2014   Time 8   Period Weeks   Additional Long Term Goals   Additional Long Term Goals Yes   PT LONG TERM GOAL #6   Title The patient will negotiate level and unlevel community surfaces with least restrictive assistive device x 1200 ft modified indep.  Target date 10/17/2014   Time 8   Period Weeks           Plan - 09/13/14 1327    Clinical Impression Statement Dynamic gait training with [facility] emphasized today, as well as practicing ambulating in parallel bars without AD (as pt's goal is to ambulate without cane). Focused on balance training as well, and pt continues to be challenged with narrow BOS and compliant surfaces. pt is making progress towards goals.    Pt will benefit from skilled therapeutic intervention in order to improve on the following deficits Abnormal gait;Difficulty walking;Postural  dysfunction;Decreased balance;Decreased activity tolerance;Decreased mobility   Rehab Potential Good   PT Frequency 2x / week   PT Duration 8 weeks   PT Treatment/Interventions Therapeutic activities;Patient/family education;Therapeutic exercise;Manual techniques;Balance training;Gait training;Neuromuscular re-education;Stair training;Functional mobility training   PT Next Visit Plan continue focusing on balance activities w/head turns on compliant surfaces (emphasis with feet together and eyes closed), dynamic gait with SPC, stair training, coordination activities   Consulted and Agree with Plan of Care Patient  Problem List Patient Active Problem List   Diagnosis Date Noted  . Abnormal LFTs 08/12/2014  . Ataxia   . CVA (cerebral infarction) 08/03/2014  . ARF (acute renal failure)   . Insulin dependent diabetes mellitus   . 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 Intractable nausea and vomiting 07/27/2014  . Vertigo 07/27/2014  . Essential hypertension 07/27/2014  . Type 1 diabetes mellitus 05/29/2010  . Hyperlipidemia 05/29/2010  . Benign essential HTN 05/29/2010  . PLANTAR FASCIITIS 05/29/2010    Fanny Dance 09/13/2014, 1:31 PM  [facility] [address] [place], [place], [address] Phone: [phone]   Fax:  [phone]

## 2014-09-15 ENCOUNTER — Encounter: Payer: Self-pay | Admitting: Occupational Therapy

## 2014-09-15 ENCOUNTER — Ambulatory Visit: Payer: 59 | Admitting: Rehabilitative and Restorative Service Providers"

## 2014-09-15 ENCOUNTER — Ambulatory Visit: Payer: 59 | Admitting: Occupational Therapy

## 2014-09-15 DIAGNOSIS — R531 Weakness: Secondary | ICD-10-CM

## 2014-09-15 DIAGNOSIS — R6889 Other general symptoms and signs: Secondary | ICD-10-CM

## 2014-09-15 DIAGNOSIS — Z7409 Other reduced mobility: Principal | ICD-10-CM

## 2014-09-15 DIAGNOSIS — I69319 Unspecified symptoms and signs involving cognitive functions following cerebral infarction: Secondary | ICD-10-CM

## 2014-09-15 DIAGNOSIS — H814 Vertigo of central origin: Secondary | ICD-10-CM

## 2014-09-15 DIAGNOSIS — IMO0002 Reserved for concepts with insufficient information to code with codable children: Secondary | ICD-10-CM

## 2014-09-15 DIAGNOSIS — I6980 Unspecified sequelae of other cerebrovascular disease: Secondary | ICD-10-CM | POA: Diagnosis not present

## 2014-09-15 DIAGNOSIS — R269 Unspecified abnormalities of gait and mobility: Secondary | ICD-10-CM

## 2014-09-15 NOTE — Therapy (Signed)
[facility] [address] [place], [place], [address] Phone: [phone]   Fax:  [phone]  Physical Therapy Treatment  Patient Details  Name: Adrian Neal MRN: [id number] Date of Birth: 1965/08/26 Referring Provider:  Anda Kraft, MD  Encounter Date: 09/15/2014      PT End of Session - 09/15/14 1249    Visit Number 9   Number of Visits 16   Date for PT Re-Evaluation 10/17/14   Authorization Type U[employer] private, check # of visits after eval   PT Start Time 1100   PT Stop Time 1145   PT Time Calculation (min) 45 min   Equipment Utilized During Treatment Gait belt   Activity Tolerance Patient tolerated treatment well   Behavior During Therapy [facility] for tasks assessed/performed      Past Medical History  Diagnosis Date  . Hypertension   . Diabetes mellitus without complication     diagnosed at age 3  . 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 Eye surgery  1990    for retinopathy   . Cataract extraction w/ intraocular lens implant  1994    There were no vitals filed for this visit.  Visit Diagnosis:  Decreased strength, endurance, and mobility  Lack of coordination due to stroke  Vertigo, central origin, unspecified laterality  Abnormality of gait      Subjective Assessment - 09/15/14 1107    Subjective pt reports no pain pain today and he is experiencing his typical level of dizziness (about 2/10), pt states he did his exercises yesterday but feels he did not do the more challenging exercises due to his low blood sugar. pt reports his blood surgar is normal today.    Patient Stated Goals To be able to walk without RW.   Currently in Pain? No/denies       Treatment   Neuro Re-Education:   Balance Master Training -Sensory Organization Testing=48% compared to age/height normative value of 70%.   Patient demonstrated  normal use of somatosensory feedback for balance, moderately decreased use of visual feedback for balance, and moderately decreased use of vestibular feedback for balance.  -Anterior/posterior training on Balance Master emphasizing anterior and posterior weight shifting, coming to center, and under a variety of somatosensory and visual conditions.  16 minutes and 34 seconds of training.            PT Short Term Goals - 08/17/14 1203    PT SHORT TERM GOAL #1   Title The patient will be indep with HEP for balance, and mobility.  Target date 09/16/2014   Time 4   Period Weeks   PT SHORT TERM GOAL #2   Title The patient will improve Berg score from 30/56 up to 36/56 to demo decreased risk for falls.  Target date 09/16/2014   Time 4   Period Weeks   PT SHORT TERM GOAL #3   Title The patient will ambulate household distances/level surfaces (300 ft) without a device independently.  Target date 09/16/2014   Time 4   Period Weeks   PT SHORT TERM GOAL #4   Title The patient will increase walking speed from 1.2 ft/sec up to 1.8 ft/sec to demo decreased risk for falls (with least restrictive device).  Target date 09/16/2014   Time 4   Period Weeks   PT SHORT TERM GOAL #5   Title The patient will  negotiate 4 steps x 3 reps with one handrail and reciprocal pattern modified indep. Target date 09/16/2014   Time 4   Period Weeks           PT Long Term Goals - 08/17/14 1206    PT LONG TERM GOAL #1   Title The patient will be indep with progression of HEP. Target date 10/17/2014   Time 8   Period Weeks   PT LONG TERM GOAL #2   Title The patient will improve stroke impact scale mobility by 15% (baseline 64%) to demo improved self perception of mobility.  Target date 10/17/2014   Time 8   Period Weeks   PT LONG TERM GOAL #3   Title The patient will improve Berg balance score from 30/56 up to 44/56 to demo decreasing risk for falls.  Target date 10/17/2014   Time 8   Period Weeks   PT LONG TERM  GOAL #4   Title The patient will improve gait speed from 1.2 ft/sec up to 2.6 ft/sec to demo transition to "full community ambulator" classification of gait.  Target date 10/17/2014   Time 8   Period Weeks   PT LONG TERM GOAL #5   Title The patient will verbalize understanding of stroke risk factors and warning signs.  Target date 10/17/2014   Time 8   Period Weeks   Additional Long Term Goals   Additional Long Term Goals Yes   PT LONG TERM GOAL #6   Title The patient will negotiate level and unlevel community surfaces with least restrictive assistive device x 1200 ft modified indep.  Target date 10/17/2014   Time 8   Period Weeks               Plan - 09/15/14 1251    Clinical Impression Statement pt responded well to Balance Master sensory organization testing and balance training. pt demonstrated increased deficits when attempting to effectively use vestibular and visual inputs for balance when somatosensory conditions were compromised. pt demo loss of balance under conditions 4, 5, and 6.  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    Pt will benefit from skilled therapeutic intervention in order to improve on the following deficits Abnormal gait;Difficulty walking;Postural dysfunction;Decreased balance;Decreased activity tolerance;Decreased mobility   Rehab Potential Good   PT Frequency 2x / week   PT Duration 8 weeks   PT Treatment/Interventions Therapeutic activities;Patient/family education;Therapeutic exercise;Manual techniques;Balance training;Gait training;Neuromuscular re-education;Stair training;Functional mobility training   PT Next Visit Plan balance training activities on Balance Master with anterior and posterior weightshifting focusing on somatosensory and vestibular training; gait training at parallel bars with visual input and no AD; emphasis on balance and weight shifting to left side; coordination  activities    Consulted and Agree with Plan of Care Patient        Problem List Patient Active Problem List   Diagnosis Date Noted  . Abnormal LFTs 08/12/2014  . Ataxia   . CVA (cerebral infarction) 08/03/2014  . ARF (acute renal failure)   . Insulin dependent diabetes mellitus   . 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 Intractable nausea and vomiting 07/27/2014  . Vertigo 07/27/2014  . Essential hypertension 07/27/2014  . Type 1 diabetes mellitus 05/29/2010  . Hyperlipidemia 05/29/2010  . Benign essential HTN 05/29/2010  . PLANTAR FASCIITIS 05/29/2010   This entire session was performed under direct supervision and direction of a licensed therapist/therapist  assistant . I have personally read, edited and approve of the note as written. WEAVER,CHRISTINA, PT  Fanny Dance, SPT 09/15/2014, 9:09 PM  [facility] [address] [place], [place], [address] Phone: [phone]   Fax:  [phone]

## 2014-09-15 NOTE — Therapy (Signed)
[facility] [address] [place], [place], [address] Phone: [phone]   Fax:  [phone]  Occupational Therapy Treatment  Patient Details  Name: Adrian Neal MRN: [id number] Date of Birth: [birth year] Referring Provider:  Anda Kraft, MD  Encounter Date: 09/15/2014      OT End of Session - 09/15/14 1234    Visit Number 9   Number of Visits 15   Date for OT Re-Evaluation 10/15/14   Authorization Type UHC   OT Start Time 0925   OT Stop Time 1015   OT Time Calculation (min) 50 min   Equipment Utilized During Treatment GAIT BELT   Activity Tolerance Patient tolerated treatment well      Past Medical History  Diagnosis Date  . Hypertension   . Diabetes mellitus without complication     diagnosed at age 45  . 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 Eye surgery  1990    for retinopathy   . Cataract extraction w/ intraocular lens implant  1994    There were no vitals filed for this visit.  Visit Diagnosis:  Decreased strength, endurance, and mobility  Cognitive deficits following cerebral infarction      Subjective Assessment - 09/15/14 0928    Patient Stated Goals Get my hand to start working better   Currently in Pain? No/denies                      OT Treatments/Exercises (OP) - 09/15/14 0933    ADLs   Functional Mobility dynamic standing reaching outside BOS bilaterally, and stepping backwards while performing reaching activities LUE with 2 lb. cuff weight, LOB x1 (for strength/endurance, activity tolerance, and functional balance for IADLS and work related tasks). Therapist then demo safety techniques for getting items out of low cabinets with one hand countertop support for balance; pt return demo. Therapist also demo bilateral UE reaching into high cabinets while standing with countertop support; pt return  demo   Cognitive Exercises   Problem Solving Complex planning tasks from handout (for problem solving, organization, and planning ahead): #1 planning breakfast for group of people with a limited budget - pt I'ly self corrects errors when overbudgeted and demo problem solving skills to correct budget, but didn't show best solution; Pt required extra time to complete, and made 1 small error in calculations (off by 5 cents). #2: Pt made menu for breakfast, lunch, and dinner then made corresponding grocery list - Pt made menu but lacked all ingredients for grocery list.    Exercises   Exercises Shoulder                  OT Short Term Goals - 09/09/14 0949    OT SHORT TERM GOAL #1   Title Independent w/ HEP for coordination (due 09/15/14)   Time 4   Period Weeks   Status Achieved   OT SHORT TERM GOAL #2   Title Pt to simulate tub transfer with tub transfer bench safely and verbalize understanding of acquistion of DME   Time 4   Period Weeks   Status Deferred  Pt does not wish to pursue purchasing DME at this time   OT SHORT TERM GOAL #3   Title Pt to demo sufficient strength and endurance LUE to retrieve/replace 5 lb. object from overhead shelf x 10 reps without rest   Time  4   Period Weeks   Status Achieved  as of 09/06/14   OT SHORT TERM GOAL #4   Title Pt to perform complex money exchange with 90% or higher accuracy in prep for work related tasks   Time 4   Period Weeks   Status Achieved  as of 09/06/14   OT SHORT TERM GOAL #5   Title Improve LUE functional use as evidenced by performing 42 or greater blocks on Box & Blocks test   Baseline met 09/09/14-49 blocks   Time 4   Period Weeks   Status Achieved           OT Long Term Goals - 09/09/14 0949    OT LONG TERM GOAL #1   Title Improve coordination as evidenced by performing 9 hole peg test in 25 sec. or under (due 10/15/14)   Baseline 30.81 sec.    Time 8   Period Weeks   Status New   OT LONG TERM GOAL #2    Title Pt to perform dynamic standing tasks for light cleaning for 15 minutes or greater w/o rest or LOB    Time 8   Period Weeks   Status New   OT LONG TERM GOAL #3   Title Pt to perform divided attention tasks between physical and cognitive tasks with 90% or greater accuracy in prep for work related tasks   Time 8   Period Weeks   Status New               Plan - 09/15/14 1235    Clinical Impression Statement Pt with increased tolerance to dynamic standing activities with LOB x1.    Plan theraband HEP for LUE (if not already given), coordination with reaching LUE while standing, UBE   OT Home Exercise Plan coordination HEP issued 08/23/14   Consulted and Agree with Plan of Care Patient        Problem List Patient Active Problem List   Diagnosis Date Noted  . Abnormal LFTs 08/12/2014  . Ataxia   . CVA (cerebral infarction) 08/03/2014  . ARF (acute renal failure)   . Insulin dependent diabetes mellitus   . 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 Intractable nausea and vomiting 07/27/2014  . Vertigo 07/27/2014  . Essential hypertension 07/27/2014  . Type 1 diabetes mellitus 05/29/2010  . Hyperlipidemia 05/29/2010  . Benign essential HTN 05/29/2010  . PLANTAR FASCIITIS 05/29/2010    Carey Bullocks, OTR/L 09/15/2014, 12:38 PM  [facility] [address] [place], [place], [address] Phone: [phone]   Fax:  [phone]

## 2014-09-19 ENCOUNTER — Encounter: Payer: 59 | Attending: Physical Medicine & Rehabilitation | Admitting: Physical Medicine & Rehabilitation

## 2014-09-19 ENCOUNTER — Ambulatory Visit: Payer: 59 | Admitting: Occupational Therapy

## 2014-09-19 ENCOUNTER — Ambulatory Visit: Payer: 59 | Admitting: Rehabilitative and Restorative Service Providers"

## 2014-09-19 ENCOUNTER — Encounter: Payer: Self-pay | Admitting: Physical Medicine & Rehabilitation

## 2014-09-19 ENCOUNTER — Encounter: Payer: Self-pay | Admitting: Occupational Therapy

## 2014-09-19 VITALS — BP 145/78 | HR 76 | Resp 14

## 2014-09-19 DIAGNOSIS — IMO0002 Reserved for concepts with insufficient information to code with codable children: Secondary | ICD-10-CM

## 2014-09-19 DIAGNOSIS — R279 Unspecified lack of coordination: Secondary | ICD-10-CM | POA: Insufficient documentation

## 2014-09-19 DIAGNOSIS — R6889 Other general symptoms and signs: Secondary | ICD-10-CM

## 2014-09-19 DIAGNOSIS — Z7409 Other reduced mobility: Secondary | ICD-10-CM | POA: Insufficient documentation

## 2014-09-19 DIAGNOSIS — E108 Type 1 diabetes mellitus with unspecified complications: Secondary | ICD-10-CM | POA: Diagnosis not present

## 2014-09-19 DIAGNOSIS — I6330 Cerebral infarction due to thrombosis of unspecified cerebral artery: Secondary | ICD-10-CM | POA: Diagnosis not present

## 2014-09-19 DIAGNOSIS — I6980 Unspecified sequelae of other cerebrovascular disease: Secondary | ICD-10-CM | POA: Diagnosis present

## 2014-09-19 DIAGNOSIS — R531 Weakness: Secondary | ICD-10-CM

## 2014-09-19 DIAGNOSIS — H8149 Vertigo of central origin, unspecified ear: Secondary | ICD-10-CM | POA: Insufficient documentation

## 2014-09-19 DIAGNOSIS — R269 Unspecified abnormalities of gait and mobility: Secondary | ICD-10-CM

## 2014-09-19 DIAGNOSIS — H814 Vertigo of central origin: Secondary | ICD-10-CM

## 2014-09-19 NOTE — Patient Instructions (Signed)
  Strengthening: Resisted Flexion   Hold tubing with __Lt___ arm(s) at side. Pull forward and up. Move shoulder through pain-free range of motion. Repeat __10__ times per set.  Do _1-2_ sessions per day , every other day   Strengthening: Resisted Extension   Hold tubing in __Lt___ hand(s), arm forward. Pull arm back, elbow straight. Repeat _10___ times per set. Do _1-2___ sessions per day, every other day.   Resisted Horizontal Abduction: Bilateral   Sit or stand, tubing in both hands, arms out in front. Keeping arms straight, pinch shoulder blades together and stretch arms out. Repeat _10___ times per set. Do _1-2___ sessions per day, every other day.   Elbow Flexion: Resisted   With tubing held in ___Lt___ hand(s) and other end secured under foot, curl arm up as far as possible. Repeat _10___ times per set. Do _1-2___ sessions per day, every other day.    Elbow Extension: Resisted   Sit in chair with resistive band secured at armrest (or hold with other hand) and ___Lt____ elbow bent. Straighten elbow. Repeat _10___ times per set.  Do _1-2___ sessions per day, every other day.

## 2014-09-19 NOTE — Patient Instructions (Signed)
PLEASE CALL ME WITH ANY PROBLEMS OR QUESTIONS (#[phone]).      

## 2014-09-19 NOTE — Therapy (Signed)
[facility] [address] [place], [place], [address] Phone: [phone]   Fax:  [phone]  Physical Therapy Treatment  Patient Details  Name: Adrian Neal MRN: [id number] Date of Birth: [birth year] Referring Provider:  Anda Kraft, MD  Encounter Date: 09/19/2014      PT End of Session - 09/19/14 1202    Visit Number 10   Number of Visits 16   Date for PT Re-Evaluation 10/17/14   Authorization Type UHC private, check # of visits after eval   PT Start Time 0930   PT Stop Time 1015   PT Time Calculation (min) 45 min   Equipment Utilized During Treatment Gait belt   Activity Tolerance Patient tolerated treatment well   Behavior During Therapy [facility] for tasks assessed/performed      Past Medical History  Diagnosis Date  . Hypertension   . Diabetes mellitus without complication     diagnosed at age 45  . 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 Eye surgery  1990    for retinopathy   . Cataract extraction w/ intraocular lens implant  1994    There were no vitals filed for this visit.  Visit Diagnosis:  Decreased strength, endurance, and mobility  Lack of coordination due to stroke  Vertigo, central origin, unspecified laterality  Abnormality of gait      Subjective Assessment - 09/19/14 0930    Subjective pt reports he used his cane when chaperoning a [facility]. pt reports he did feel there were a couple a times where he felt wobbiliy, but he always stopped what he was doing to regain his balance and never felt that he was going to fall. pt reports he always has his walker availble when he is ambulatining within the community.   pt states he feels confident using his cane at home.    Patient Stated Goals To be able to walk without RW.   Currently in Pain? No/denies      Treatment   Neuromuscular  Re-Education:  -Ladder obstacle with gait promoting longer step lengths and a narrow BOS. 4 laps. +2 CGA for safety.           [facility] Adult PT Treatment/Exercise - 09/19/14 0001    Ambulation/Gait   Ambulation/Gait Yes   Ambulation Distance (Feet) 345 Feet   Assistive device Straight cane   Gait Pattern Decreased step length - right;Decreased step length - left;Decreased trunk rotation;Wide base of support   Ambulation Surface Level;Indoor   Gait velocity 2.68  ft/sec   Stairs Yes   Stairs Assistance 5: Supervision   Stairs Assistance Details (indicate cue type and reason) --  patient very cautious when descending stairs, slows down   Stair Management Technique Alternating pattern;One rail Left   Number of Stairs 4 x 3 reps focusing on reciprocal pattern   Berg Balance Test   Sit to Stand Able to stand without using hands and stabilize independently   Standing Unsupported Able to stand safely 2 minutes   Sitting with Back Unsupported but Feet Supported on Floor or Stool Able to sit safely and securely 2 minutes   Stand to Sit Sits safely with minimal use of hands   Transfers Able to transfer safely, definite need of hands   Standing Unsupported with Eyes Closed Able to stand 10 seconds safely   Standing Ubsupported with  Feet Together Able to place feet together independently and stand 1 minute safely   From Standing, Reach Forward with Outstretched Arm Can reach forward >12 cm safely (5")   From Standing Position, Pick up Object from [place] to pick up shoe safely and easily   From Standing Position, Turn to Look Behind Over each Shoulder Looks behind from both sides and weight shifts well   Turn 360 Degrees Needs close supervision or verbal cueing   Standing Unsupported, Alternately Place Feet on Step/Stool Able to complete >2 steps/needs minimal assist   Standing Unsupported, One Foot in Front Able to plae foot ahead of the other independently and hold 30 seconds   Standing  on One Leg Tries to lift leg/unable to hold 3 seconds but remains standing independently   Total Score 44                PT Education - 09/19/14 1216    Education provided Yes   Education Details safety with ambulating in community using [facility] and walker   Person(s) Educated Patient   Methods Explanation   Comprehension Verbalized understanding          PT Short Term Goals - 09/19/14 1004    PT SHORT TERM GOAL #1   Title The patient will be indep with HEP for balance, and mobility.  Target date 09/16/2014   Time 4   Period Weeks   Status Achieved   PT SHORT TERM GOAL #2   Title The patient will improve Berg score from 30/56 up to 36/56 to demo decreased risk for falls.  Target date 09/16/2014   Baseline pt's [name] is currently 44/56   Time 4   Period Weeks   Status Achieved   PT SHORT TERM GOAL #3   Title The patient will ambulate household distances/level surfaces (300 ft) without a device independently.  Target date 09/16/2014   Baseline partially met, due to SBA for safety with transitioning with [facility]   Time 4   Period Weeks   Status Partially Met   PT SHORT TERM GOAL #4   Title The patient will increase walking speed from 1.2 ft/sec up to 1.8 ft/sec to demo decreased risk for falls (with least restrictive device).  Target date 09/16/2014   Baseline pt currently 2.68 ft/second with SPC   Time 4   Period Weeks   Status Achieved   PT SHORT TERM GOAL #5   Title The patient will negotiate 4 steps x 3 reps with one handrail and reciprocal pattern modified indep. Target date 09/16/2014   Baseline supervision currently required for safety as pt reports he is still cautious with desending steps    Time 4   Period Weeks   Status Partially Met           PT Long Term Goals - 09/19/14 1003    PT LONG TERM GOAL #1   Title The patient will be indep with progression of HEP. Target date 10/17/2014   Time 8   Period Weeks   PT LONG TERM GOAL #2   Title The patient will  improve stroke impact scale mobility by 15% (baseline 64%) to demo improved self perception of mobility.  Target date 10/17/2014   Time 8   Period Weeks   PT LONG TERM GOAL #3   Title The patient will improve Berg balance score from 30/56 up to 44/56 to demo decreasing risk for falls.  Target date 10/17/2014   Time 8  Period Weeks   PT LONG TERM GOAL #4   Title The patient will improve gait speed from 1.2 ft/sec up to 2.6 ft/sec to demo transition to "full community ambulator" classification of gait.  Target date 10/17/2014   Time 8   Period Weeks   PT LONG TERM GOAL #5   Title The patient will verbalize understanding of stroke risk factors and warning signs.  Target date 10/17/2014   Time 8   Period Weeks   PT LONG TERM GOAL #6   Title The patient will negotiate level and unlevel community surfaces with least restrictive assistive device x 1200 ft modified indep.  Target date 10/17/2014   Time 8   Period Weeks               Plan - 09/19/14 1203    Clinical Impression Statement pt is making progress towards goals. Today pt's STGs were reassessed, many of which were met. pt showed significant improvement with his gait speed (now 2.68 ft/sec) and his balance (Berg Score now 44). pt still demonstrates deficits with SLS, confidence with stairs, and independence with gait.    Pt will benefit from skilled therapeutic intervention in order to improve on the following deficits Abnormal gait;Difficulty walking;Postural dysfunction;Decreased balance;Decreased activity tolerance;Decreased mobility   Rehab Potential Good   PT Frequency 2x / week   PT Duration 8 weeks   PT Treatment/Interventions Therapeutic activities;Patient/family education;Therapeutic exercise;Manual techniques;Balance training;Gait training;Neuromuscular re-education;Stair training;Functional mobility training   PT Next Visit Plan balance training activities on Balance Master with anterior and posterior weightshifting  focusing on somatosensory and vestibular training; gait training at parallel bars with visual input and no AD; emphasis on balance and weight shifting to left side; coordination activities    Consulted and Agree with Plan of Care Patient        Problem List Patient Active Problem List   Diagnosis Date Noted  . Abnormal LFTs 08/12/2014  . Ataxia   . CVA (cerebral infarction) 08/03/2014  . ARF (acute renal failure)   . Insulin dependent diabetes mellitus   . 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 Intractable nausea and vomiting 07/27/2014  . Vertigo 07/27/2014  . Essential hypertension 07/27/2014  . Type 1 diabetes mellitus 05/29/2010  . Hyperlipidemia 05/29/2010  . Benign essential HTN 05/29/2010  . PLANTAR FASCIITIS 05/29/2010   This entire session was performed under direct supervision and direction of a licensed therapist/therapist assistant . I have personally read, edited and approve of the note as written. WEAVER,CHRISTINA, PT   Fanny Dance 09/19/2014, 12:16 PM  [facility] [address] [place], [place], [address] Phone: [phone]   Fax:  [phone]

## 2014-09-19 NOTE — Therapy (Signed)
[facility] [address] [place], [place], [address] Phone: [phone]   Fax:  [phone]  Occupational Therapy Treatment  Patient Details  Name: Adrian Neal MRN: [id number] Date of Birth: 1965/12/31 Referring Provider:  Anda Kraft, MD  Encounter Date: 09/19/2014      OT End of Session - 09/19/14 1054    Visit Number 10   Number of Visits 15   Date for OT Re-Evaluation 10/15/14   Authorization Type UHC   OT Start Time 1020   OT Stop Time 1100   OT Time Calculation (min) 40 min   Equipment Utilized During Treatment GAIT BELT   Activity Tolerance Patient tolerated treatment well      Past Medical History  Diagnosis Date  . Hypertension   . Diabetes mellitus without complication     diagnosed at age 22  . 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 Eye surgery  1990    for retinopathy   . Cataract extraction w/ intraocular lens implant  1994    There were no vitals filed for this visit.  Visit Diagnosis:  Decreased strength, endurance, and mobility  Lack of coordination due to stroke      Subjective Assessment - 09/19/14 1024    Subjective  I'm doing good   Patient is accompained by: Family member   Patient Stated Goals Get my hand to start working better   Currently in Pain? No/denies                      OT Treatments/Exercises (OP) - 09/19/14 1029    Shoulder Exercises: ROM/Strengthening   Other ROM/Strengthening Exercises Theraband ex's performed for LUE including: sh. flexion, sh. extension, horizontal abduction, elbow flexion, and elbow extension x 10 reps each (with red resistance). Issued as HEP: see pt instructions   Functional Reaching Activities   High Level Placing small pegs in pegboard vertical surface LUE with 2 lb. cuff weight on arm, while standing all for coordination, LUE  strength/endurance, and dynamic standing balance.                 OT Education - 09/19/14 1041    Education provided Yes   Education Details Theraband HEP   Person(s) Educated Patient   Methods Explanation;Demonstration;Handout   Comprehension Verbalized understanding;Returned demonstration          OT Short Term Goals - 09/09/14 0949    OT SHORT TERM GOAL #1   Title Independent w/ HEP for coordination (due 09/15/14)   Time 4   Period Weeks   Status Achieved   OT SHORT TERM GOAL #2   Title Pt to simulate tub transfer with tub transfer bench safely and verbalize understanding of acquistion of DME   Time 4   Period Weeks   Status Deferred  Pt does not wish to pursue purchasing DME at this time   OT SHORT TERM GOAL #3   Title Pt to demo sufficient strength and endurance LUE to retrieve/replace 5 lb. object from overhead shelf x 10 reps without rest   Time 4   Period Weeks   Status Achieved  as of 09/06/14   OT SHORT TERM GOAL #4   Title Pt to perform complex money exchange with 90% or higher accuracy in prep for work related tasks   Time 4   Period Weeks   Status  Achieved  as of 09/06/14   OT SHORT TERM GOAL #5   Title Improve LUE functional use as evidenced by performing 42 or greater blocks on Box & Blocks test   Baseline met 09/09/14-49 blocks   Time 4   Period Weeks   Status Achieved           OT Long Term Goals - 09/09/14 0949    OT LONG TERM GOAL #1   Title Improve coordination as evidenced by performing 9 hole peg test in 25 sec. or under (due 10/15/14)   Baseline 30.81 sec.    Time 8   Period Weeks   Status New   OT LONG TERM GOAL #2   Title Pt to perform dynamic standing tasks for light cleaning for 15 minutes or greater w/o rest or LOB    Time 8   Period Weeks   Status New   OT LONG TERM GOAL #3   Title Pt to perform divided attention tasks between physical and cognitive tasks with 90% or greater accuracy in prep for work related tasks   Time  8   Period Weeks   Status New               Plan - 09/19/14 1056    Clinical Impression Statement Pt with increased strength/endurance LUE.    Plan Review HEP prn, high level cognition, UBE   OT Home Exercise Plan coordination HEP issued 08/23/14, Theraband HEP LUE 09/19/14   Consulted and Agree with Plan of Care Patient        Problem List Patient Active Problem List   Diagnosis Date Noted  . Abnormal LFTs 08/12/2014  . Ataxia   . CVA (cerebral infarction) 08/03/2014  . ARF (acute renal failure)   . Insulin dependent diabetes mellitus   . 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 Intractable nausea and vomiting 07/27/2014  . Vertigo 07/27/2014  . Essential hypertension 07/27/2014  . Type 1 diabetes mellitus 05/29/2010  . Hyperlipidemia 05/29/2010  . Benign essential HTN 05/29/2010  . PLANTAR FASCIITIS 05/29/2010    Carey Bullocks, OTR/L 09/19/2014, 11:04 AM  [facility] [address] [place], [place], [address] Phone: [phone]   Fax:  [phone]

## 2014-09-19 NOTE — Progress Notes (Signed)
Subjective:    Patient ID: Adrian Neal, male    DOB: [birth year], 49 y.o.   MRN: [id number]  HPI   Adrian Neal is here in follow up of his left pontine CVA. He continues with outpt therapy. Therapy is working on strengthening, balance, and coordination. He is sleeping well. Appetite has been good. He states that sugars have been good with a couple exceptions. Dr. Wilson Singer has seen in follow up prior to this visit.   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 He would like to get back there eventually.    MR Viars has a disability form that needs to be filled out and turned in (in 2 days).  Form in folder Has seen his PCP since hospital discharge.  Pain Inventory Average Pain 0 Pain Right Now 0 My pain is no pain  In the last 24 hours, has pain interfered with the following? General activity 0 Relation with others 0 Enjoyment of life 0 What TIME of day is your pain at its worst? no pain Sleep (in general) Good  Pain is worse with: no pain Pain improves with: no pain Relief from Meds: no pain  Mobility walk with assistance use a walker ability to climb steps?  yes do you drive?  no  Function disabled: date disabled 07/27/2014  Neuro/Psych No problems in this area  Prior Studies Any changes since last visit?  no  Physicians involved in your care Any changes since last visit?  no   Family History  Problem Relation Age of Onset  . Stroke Mother   . Hypertension Mother   . Hyperlipidemia Mother   . Hyperlipidemia Father   . Hypertension Father   . Diabetes Father    History   Social History  . Marital Status: Married    Spouse Name: N/A  . Number of Children: N/A  . Years of Education: N/A   Social History Main Topics  . Smoking status: Former Smoker    Quit date: 04/30/1991  . Smokeless tobacco: Not on file  . Alcohol Use: No  . Drug Use: No  . Sexual Activity: Not on file   Other Topics  Concern  . None   Social History Narrative   Past Surgical History  Procedure Laterality Date  . Eye surgery  1990    for retinopathy   . Cataract extraction w/ intraocular lens implant  1994   Past Medical History  Diagnosis Date  . Hypertension   . Diabetes mellitus without complication     diagnosed at age 63  . Retinopathy due to secondary diabetes mellitus     right   BP 145/78 mmHg  Pulse 76  Resp 14  SpO2 98%  Opioid Risk Score:   Fall Risk Score: Moderate Fall Risk (6-13 points) (educated and given handout on fall prevention in the home)`1  Depression screen PHQ 2/9  Depression screen PHQ 2/9 09/19/2014  Decreased Interest 0  Down, Depressed, Hopeless 0  PHQ - 2 Score 0  Altered sleeping 0  Tired, decreased energy 0  Change in appetite 0  Feeling bad or failure about yourself  0  Trouble concentrating 0  Moving slowly or fidgety/restless 0  Suicidal thoughts 0  PHQ-9 Score 0      Review of Systems  All other systems reviewed and are negative.      Objective:   Physical Exam  Constitutional: He is oriented to  person, place, and time. He appears well-developed and well-nourished. comfortable HENT: oral mucosa pink and moist Head: Normocephalic and atraumatic.  Eyes: Conjunctivae are normal. Pupils are equal, round, and reactive to light.  Neck: Normal range of motion. Neck supple.  Cardiovascular: Normal rate and regular rhythm. no murmur Respiratory: Effort normal and breath sounds normal. No respiratory distress. He has no wheezes. He exhibits no tenderness.  GI: Soft. Bowel sounds are normal. He exhibits no distension. There is no tenderness.  Musculoskeletal: He exhibits no edema or tenderness.  Neurological: He is alert and oriented to person, place, and time.  Strength nearly 5/5 to 5/5 in all 4's. Speech clear and articulate. Follows commands without difficulty. Ataxia with left finger to nose is mild but persists. Mnimal pronator  drift. leans a bit to left when walking. Tends to drift left as well but can compensate. Uses rolling walker efficiently.  Psychiatric: Calm and appropriate Skin: Skin is warm and dry.  Psychiatric: He has a normal mood and affect. His speech is normal and behavior is normal. Judgment and thought content normal. Cognition and memory are normal.    Assessment/Plan: 1. 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  -made need a work conditioning program if he intends on going back to [employer]. 3. Pain Management: N/A 4  CKD: per pcp 5.  DM type 1: stressed importance of tight control  I'll see him back in about 2 months. Thirty minutes of face to face patient care time were spent during this visit. All questions were encouraged and answered.

## 2014-09-21 ENCOUNTER — Ambulatory Visit: Payer: 59 | Admitting: Rehabilitative and Restorative Service Providers"

## 2014-09-21 ENCOUNTER — Ambulatory Visit: Payer: 59 | Admitting: Occupational Therapy

## 2014-09-21 DIAGNOSIS — R531 Weakness: Secondary | ICD-10-CM

## 2014-09-21 DIAGNOSIS — IMO0002 Reserved for concepts with insufficient information to code with codable children: Secondary | ICD-10-CM

## 2014-09-21 DIAGNOSIS — Z7409 Other reduced mobility: Principal | ICD-10-CM

## 2014-09-21 DIAGNOSIS — R6889 Other general symptoms and signs: Secondary | ICD-10-CM

## 2014-09-21 DIAGNOSIS — I6980 Unspecified sequelae of other cerebrovascular disease: Secondary | ICD-10-CM | POA: Diagnosis not present

## 2014-09-21 DIAGNOSIS — H814 Vertigo of central origin: Secondary | ICD-10-CM

## 2014-09-21 DIAGNOSIS — R269 Unspecified abnormalities of gait and mobility: Secondary | ICD-10-CM

## 2014-09-21 NOTE — Therapy (Signed)
[facility] [address] [place], [place], [address] Phone: [phone]   Fax:  [phone]  Physical Therapy Treatment  Patient Details  Name: Adrian Neal MRN: [id number] Date of Birth: [birth year] Referring Provider:  Anda Kraft, MD  Encounter Date: 09/21/2014      PT End of Session - 09/21/14 1224    Visit Number 11   Number of Visits 16   Date for PT Re-Evaluation 10/17/14   Authorization Type UHC private, check # of visits after eval   PT Start Time 0935   PT Stop Time 1016   PT Time Calculation (min) 41 min   Equipment Utilized During Treatment Gait belt   Activity Tolerance Patient tolerated treatment well   Behavior During Therapy [facility] for tasks assessed/performed      Past Medical History  Diagnosis Date  . Hypertension   . Diabetes mellitus without complication     diagnosed at age 38  . 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 Eye surgery  1990    for retinopathy   . Cataract extraction w/ intraocular lens implant  1994    There were no vitals filed for this visit.  Visit Diagnosis:  Decreased strength, endurance, and mobility  Lack of coordination due to stroke  Vertigo, central origin, unspecified laterality  Abnormality of gait      Subjective Assessment - 09/21/14 1222    Subjective pt reports no falls or recent changes. pt reports he is continuing to feel lateral pull to the left, which is consistently worse in the mornings when he first gets up. Today pt rates the intensity of the pull at a level 2/10.    Patient Stated Goals To be able to walk without RW.   Currently in Pain? No/denies      Treatment   Neuromuscular Re-education:  -Hotel manager on Balance Master emphasizing anterior weight shifting and weight shifting to the right.   Pt demonstrated difficulty with right weight shifting to  reach selected targets. Support under variable and responsive conditions, surroundings under responsive conditions. 22 minutes of balance training.   -pt instructed to reach for cones behind him and then step-up onto platform and place cones on top of a full length mirror and then step down from the platform. Pt placed 10 cones reaching for 5 behind him to the left and right. CGA for safety.  -Reaching for targets on cabinet while maintaining SLS near counter. CGA for safety. 5 reps.  -Lateral hip bumps near counter while maintaing SLS. CGA for safety. 5 reps shifting to the right, and 5 reps shifting to the left. Tactile cues given when pt weight shifting to the right (pt instructed to push weight into PT's hand with arm abducted to 90 degrees)        PT Short Term Goals - 09/19/14 1004    PT SHORT TERM GOAL #1   Title The patient will be indep with HEP for balance, and mobility.  Target date 09/16/2014   Time 4   Period Weeks   Status Achieved   PT SHORT TERM GOAL #2   Title The patient will improve Berg score from 30/56 up to 36/56 to demo decreased risk for falls.  Target date 09/16/2014   Baseline pt's [name] is currently 44/56   Time 4   Period Weeks   Status Achieved   PT SHORT  TERM GOAL #3   Title The patient will ambulate household distances/level surfaces (300 ft) without a device independently.  Target date 09/16/2014   Baseline partially met, due to SBA for safety with transitioning with [facility]   Time 4   Period Weeks   Status Partially Met   PT SHORT TERM GOAL #4   Title The patient will increase walking speed from 1.2 ft/sec up to 1.8 ft/sec to demo decreased risk for falls (with least restrictive device).  Target date 09/16/2014   Baseline pt currently 2.68 ft/second with SPC   Time 4   Period Weeks   Status Achieved   PT SHORT TERM GOAL #5   Title The patient will negotiate 4 steps x 3 reps with one handrail and reciprocal pattern modified indep. Target date 09/16/2014    Baseline supervision currently required for safety as pt reports he is still cautious with desending steps    Time 4   Period Weeks   Status Partially Met           PT Long Term Goals - 09/19/14 1003    PT LONG TERM GOAL #1   Title The patient will be indep with progression of HEP. Target date 10/17/2014   Time 8   Period Weeks   PT LONG TERM GOAL #2   Title The patient will improve stroke impact scale mobility by 15% (baseline 64%) to demo improved self perception of mobility.  Target date 10/17/2014   Time 8   Period Weeks   PT LONG TERM GOAL #3   Title The patient will improve Berg balance score from 30/56 up to 44/56 to demo decreasing risk for falls.  Target date 10/17/2014   Time 8   Period Weeks   PT LONG TERM GOAL #4   Title The patient will improve gait speed from 1.2 ft/sec up to 2.6 ft/sec to demo transition to "full community ambulator" classification of gait.  Target date 10/17/2014   Time 8   Period Weeks   PT LONG TERM GOAL #5   Title The patient will verbalize understanding of stroke risk factors and warning signs.  Target date 10/17/2014   Time 8   Period Weeks   PT LONG TERM GOAL #6   Title The patient will negotiate level and unlevel community surfaces with least restrictive assistive device x 1200 ft modified indep.  Target date 10/17/2014   Time 8   Period Weeks               Plan - 09/21/14 1226    Clinical Impression Statement pt tolerated treatment session well and is continuing to make progress towards goals. pt had good response to balance training on Pension scheme manager. pt demonstrated increased unsteadiness when stepping to/down from platform without UE support and reported some dizziness when looking up for an activity today.    Pt will benefit from skilled therapeutic intervention in order to improve on the following deficits Abnormal gait;Difficulty walking;Postural dysfunction;Decreased balance;Decreased activity tolerance;Decreased mobility    Rehab Potential Good   PT Frequency 2x / week   PT Duration 8 weeks   PT Treatment/Interventions Therapeutic activities;Patient/family education;Therapeutic exercise;Manual techniques;Balance training;Gait training;Neuromuscular re-education;Stair training;Functional mobility training   PT Next Visit Plan balance training activities on Balance Master with anterior and posterior weightshifting focusing on somatosensory and vestibular training; gait training at parallel bars with visual input and no AD; emphasis on balance and weight shifting to left side; coordination activities    Consulted and  Agree with Plan of Care Patient        Problem List Patient Active Problem List   Diagnosis Date Noted  . Abnormal LFTs 08/12/2014  . Ataxia   . CVA (cerebral infarction) 08/03/2014  . ARF (acute renal failure)   . Insulin dependent diabetes mellitus   . 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 Intractable nausea and vomiting 07/27/2014  . Vertigo 07/27/2014  . Essential hypertension 07/27/2014  . Type 1 diabetes mellitus 05/29/2010  . Hyperlipidemia 05/29/2010  . Benign essential HTN 05/29/2010  . PLANTAR FASCIITIS 05/29/2010   This entire session was performed under direct supervision and direction of a licensed therapist/therapist assistant . I have personally read, edited and approve of the note as written. WEAVER,CHRISTINA, PT   Fanny Dance 09/21/2014, 12:33 PM  [facility] [address] [place], [place], [address] Phone: [phone]   Fax:  [phone]

## 2014-09-21 NOTE — Therapy (Signed)
[facility] [address] [place], [place], [address] Phone: [phone]   Fax:  [phone]  Occupational Therapy Treatment  Patient Details  Name: Adrian Neal MRN: [id number] Date of Birth: [birth year] Referring Provider:  Anda Kraft, MD  Encounter Date: 09/21/2014      OT End of Session - 09/21/14 1213    Visit Number 11   Number of Visits 17   Date for OT Re-Evaluation 10/15/14   Authorization Type UHC   OT Start Time 1020   OT Stop Time 1100   OT Time Calculation (min) 40 min   Equipment Utilized During Treatment GAIT BELT   Activity Tolerance Patient tolerated treatment well      Past Medical History  Diagnosis Date  . Hypertension   . Diabetes mellitus without complication     diagnosed at age 20  . 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 Eye surgery  1990    for retinopathy   . Cataract extraction w/ intraocular lens implant  1994    There were no vitals filed for this visit.  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 The theraband ex's are going well; I don't have any questions with them   Patient Stated Goals Get my hand to start working better   Currently in Pain? No/denies       O.T. TREATMENT:   Pt ambulating with cane in Rt hand while tossing ball in Lt hand with mod difficulty and min drops. Progressed to same activity while mentally subtracting by 7's without errors, min drops. Then carried weighted plate in Lt hand with cane in Rt hand while ambulating and mentally adding by 13 without errors. Pt then carried plate with marble and small ball on plate (to keep plate level) with Lt hand while ambulating w/o drops. Pt tossing medium sized ball in air and  catching with both hands while naming foods with each letter of alphabet w/o difficulty. Pt standing with countertop support to toss ball with Lt hand, Rt hand separately, then simultaneously (with min to mod drops Lt hand doing simultaneously). Pt then juggling 2 balls with min to mod drops Lt hand. Pt standing with chair placed behind pt while dribbling large ball Lt hand; pt only had trouble when switching hands (from Rt back to Lt hand)                         OT Short Term Goals - 09/09/14 0949    OT SHORT TERM GOAL #1   Title Independent w/ HEP for coordination (due 09/15/14)   Time 4   Period Weeks   Status Achieved   OT SHORT TERM GOAL #2   Title Pt to simulate tub transfer with tub transfer bench safely and verbalize understanding of acquistion of DME   Time 4   Period Weeks   Status Deferred  Pt does not wish to pursue purchasing DME at this time   OT SHORT TERM GOAL #3   Title Pt to demo sufficient strength and endurance LUE to retrieve/replace 5 lb. object from overhead shelf x 10 reps without rest   Time 4   Period Weeks   Status  Achieved  as of 09/06/14   OT SHORT TERM GOAL #4   Title Pt to perform complex money exchange with 90% or higher accuracy in prep for work related tasks   Time 4   Period Weeks   Status Achieved  as of 09/06/14   OT SHORT TERM GOAL #5   Title Improve LUE functional use as evidenced by performing 42 or greater blocks on Box & Blocks test   Baseline met 09/09/14-49 blocks   Time 4   Period Weeks   Status Achieved           OT Long Term Goals - 09/09/14 0949    OT LONG TERM GOAL #1   Title Improve coordination as evidenced by performing 9 hole peg test in 25 sec. or under (due 10/15/14)   Baseline 30.81 sec.    Time 8   Period Weeks   Status New   OT LONG TERM GOAL #2   Title Pt to perform dynamic standing tasks for light cleaning for 15 minutes or greater w/o rest or LOB    Time 8   Period Weeks   Status New   OT  LONG TERM GOAL #3   Title Pt to perform divided attention tasks between physical and cognitive tasks with 90% or greater accuracy in prep for work related tasks   Time [place] - 09/21/14 1218    Clinical Impression Statement Pt progressing with endurance, gross motor coordination and divided attention   Plan coordination, dynamic standing, divided attention, [place] coordination HEP issued 08/23/14, Theraband HEP LUE 09/19/14        Problem List Patient Active Problem List   Diagnosis Date Noted  . Abnormal LFTs 08/12/2014  . Ataxia   . CVA (cerebral infarction) 08/03/2014  . ARF (acute renal failure)   . Insulin dependent diabetes mellitus   . 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 Intractable nausea and vomiting 07/27/2014  . Vertigo 07/27/2014  . Essential hypertension 07/27/2014  . Type 1 diabetes mellitus 05/29/2010  . Hyperlipidemia 05/29/2010  . Benign essential HTN 05/29/2010  . PLANTAR FASCIITIS 05/29/2010    Carey Bullocks, OTR/L 09/21/2014, 12:21 PM  [facility] [address] [place], [place], [address] Phone: [phone]   Fax:  [phone]

## 2014-09-27 ENCOUNTER — Ambulatory Visit: Payer: 59 | Admitting: Physical Therapy

## 2014-09-27 DIAGNOSIS — I6980 Unspecified sequelae of other cerebrovascular disease: Secondary | ICD-10-CM | POA: Diagnosis not present

## 2014-09-27 DIAGNOSIS — R531 Weakness: Secondary | ICD-10-CM

## 2014-09-27 DIAGNOSIS — IMO0002 Reserved for concepts with insufficient information to code with codable children: Secondary | ICD-10-CM

## 2014-09-27 DIAGNOSIS — H814 Vertigo of central origin: Secondary | ICD-10-CM

## 2014-09-27 DIAGNOSIS — Z7409 Other reduced mobility: Principal | ICD-10-CM

## 2014-09-27 DIAGNOSIS — R269 Unspecified abnormalities of gait and mobility: Secondary | ICD-10-CM

## 2014-09-27 NOTE — Therapy (Signed)
[facility] [address] [place], [place], [address] Phone: [phone]   Fax:  [phone]  Physical Therapy Treatment  Patient Details  Name: Adrian Neal MRN: [id number] Date of Birth: 1966/01/07 Referring Provider:  Anda Kraft, MD  Encounter Date: 09/27/2014      PT End of Session - 09/27/14 1025    Visit Number 12   Number of Visits 16   Date for PT Re-Evaluation 10/17/14   Authorization Type [employer] private, check # of visits after eval   PT Start Time [phone]   PT Stop Time 1019   PT Time Calculation (min) 43 min   Equipment Utilized During Treatment Gait belt   Activity Tolerance Patient tolerated treatment well   Behavior During Therapy [facility] for tasks assessed/performed      Past Medical History  Diagnosis Date  . Hypertension   . Diabetes mellitus without complication     diagnosed at age 29  . 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 Eye surgery  1990    for retinopathy   . Cataract extraction w/ intraocular lens implant  1994    There were no vitals filed for this visit.  Visit Diagnosis:  Decreased strength, endurance, and mobility  Lack of coordination due to stroke  Vertigo, central origin, unspecified laterality  Abnormality of gait      Subjective Assessment - 09/27/14 0938    Subjective pt reports he was walking in his yard (grassy) without device and reported had had a few "stumbles" because his yard is not level. pt reports his dizziness is still currently a 2/10.   Currently in Pain? No/denies      Treatment   Gait Training: -amb outside on unlevel surfaces with SPC and CGA for safety. Cues provided for cane management in grass.  -pt ambulated 4 laps (approx 500 ft) around gym and reception area using no AD and CGA, practicing walking with various speeds and incorporating head turns during gait. CGA  for safety. Verbal cues for postural alignment.  Neuromuscular Re-Education: -pt practiced multitasking and coordination with red physioball during gait incorporating vertical head turns. Pt instructed to hit visual targets on the floor with the physioball and then bring the ball up over head into full shoulder flexion and repeat motion during gait. 10 targets x 4 reps. CGA for safety. -Balance training on compliant surface in front of mirror. Visual cue provided for alignment center and pt instructed to maintain correct alignment with feet apart, feet together, and with arm motions. CGA for safety.  -Balance and weight shifting activities for postural awareness and midline orientaiton against wall with CGA for safety: pt instructed to lean left side against wall with shoulder and hip touching the wall and feet farther away from the wall, pt then practiced weight shifting to midline position using a mirror for visual input. 10 reps. Pt then practiced standing in tandem stance and STS near wall with visual input from the mirror and cues to bear weight through LLE. 3 reps.                         PT Education - 09/27/14 1026    Education provided Yes   Education Details safety when ambulating on outdoor, unlevel surfaces. pt encouraged to continue using SPC at all times when ambulating.    Person(s) Educated Patient  Methods Explanation   Comprehension Verbalized understanding          PT Short Term Goals - 09/19/14 1004    PT SHORT TERM GOAL #1   Title The patient will be indep with HEP for balance, and mobility.  Target date 09/16/2014   Time 4   Period Weeks   Status Achieved   PT SHORT TERM GOAL #2   Title The patient will improve Berg score from 30/56 up to 36/56 to demo decreased risk for falls.  Target date 09/16/2014   Baseline pt's [name] is currently 44/56   Time 4   Period Weeks   Status Achieved   PT SHORT TERM GOAL #3   Title The patient will ambulate  household distances/level surfaces (300 ft) without a device independently.  Target date 09/16/2014   Baseline partially met, due to SBA for safety with transitioning with [facility]   Time 4   Period Weeks   Status Partially Met   PT SHORT TERM GOAL #4   Title The patient will increase walking speed from 1.2 ft/sec up to 1.8 ft/sec to demo decreased risk for falls (with least restrictive device).  Target date 09/16/2014   Baseline pt currently 2.68 ft/second with SPC   Time 4   Period Weeks   Status Achieved   PT SHORT TERM GOAL #5   Title The patient will negotiate 4 steps x 3 reps with one handrail and reciprocal pattern modified indep. Target date 09/16/2014   Baseline supervision currently required for safety as pt reports he is still cautious with desending steps    Time 4   Period Weeks   Status Partially Met           PT Long Term Goals - 09/19/14 1003    PT LONG TERM GOAL #1   Title The patient will be indep with progression of HEP. Target date 10/17/2014   Time 8   Period Weeks   PT LONG TERM GOAL #2   Title The patient will improve stroke impact scale mobility by 15% (baseline 64%) to demo improved self perception of mobility.  Target date 10/17/2014   Time 8   Period Weeks   PT LONG TERM GOAL #3   Title The patient will improve Berg balance score from 30/56 up to 44/56 to demo decreasing risk for falls.  Target date 10/17/2014   Time 8   Period Weeks   PT LONG TERM GOAL #4   Title The patient will improve gait speed from 1.2 ft/sec up to 2.6 ft/sec to demo transition to "full community ambulator" classification of gait.  Target date 10/17/2014   Time 8   Period Weeks   PT LONG TERM GOAL #5   Title The patient will verbalize understanding of stroke risk factors and warning signs.  Target date 10/17/2014   Time 8   Period Weeks   PT LONG TERM GOAL #6   Title The patient will negotiate level and unlevel community surfaces with least restrictive assistive device x 1200 ft  modified indep.  Target date 10/17/2014   Time 8   Period Weeks               Plan - 09/27/14 1026    Clinical Impression Statement pt demonstrated significant progress with gait training today using SPC on outdoor, unlevel surfaces. Good response with gait training indoors on level surfaces without AD. pt is progressing towards goals.    Pt will benefit from skilled therapeutic  intervention in order to improve on the following deficits Abnormal gait;Difficulty walking;Postural dysfunction;Decreased balance;Decreased activity tolerance;Decreased mobility   Rehab Potential Good   PT Frequency 2x / week   PT Duration 8 weeks   PT Treatment/Interventions Therapeutic activities;Patient/family education;Therapeutic exercise;Manual techniques;Balance training;Gait training;Neuromuscular re-education;Stair training;Functional mobility training   PT Next Visit Plan balance training activities on Balance Master with anterior and posterior weightshifting focusing on somatosensory and vestibular training; gait training indoors on level surface with no AD; emphasis on balance and weight shifting to left side; coordination activities    Consulted and Agree with Plan of Care Patient        Problem List Patient Active Problem List   Diagnosis Date Noted  . Abnormal LFTs 08/12/2014  . Ataxia   . CVA (cerebral infarction) 08/03/2014  . ARF (acute renal failure)   . Insulin dependent diabetes mellitus   . 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 Intractable nausea and vomiting 07/27/2014  . Vertigo 07/27/2014  . Essential hypertension 07/27/2014  . Type 1 diabetes mellitus 05/29/2010  . Hyperlipidemia 05/29/2010  . Benign essential HTN 05/29/2010  . PLANTAR FASCIITIS 05/29/2010    Fanny Dance 09/27/2014, 10:31 AM  [facility] [address] [place], [place], [address] Phone: [phone]   Fax:   [phone]

## 2014-09-29 ENCOUNTER — Ambulatory Visit: Payer: 59 | Attending: Physical Medicine & Rehabilitation | Admitting: Physical Therapy

## 2014-09-29 DIAGNOSIS — R279 Unspecified lack of coordination: Secondary | ICD-10-CM | POA: Diagnosis present

## 2014-09-29 DIAGNOSIS — IMO0002 Reserved for concepts with insufficient information to code with codable children: Secondary | ICD-10-CM

## 2014-09-29 DIAGNOSIS — I6931 Cognitive deficits following cerebral infarction: Secondary | ICD-10-CM | POA: Diagnosis present

## 2014-09-29 DIAGNOSIS — R269 Unspecified abnormalities of gait and mobility: Secondary | ICD-10-CM | POA: Diagnosis present

## 2014-09-29 DIAGNOSIS — H8149 Vertigo of central origin, unspecified ear: Secondary | ICD-10-CM | POA: Insufficient documentation

## 2014-09-29 DIAGNOSIS — Z7409 Other reduced mobility: Secondary | ICD-10-CM | POA: Insufficient documentation

## 2014-09-29 DIAGNOSIS — I6980 Unspecified sequelae of other cerebrovascular disease: Secondary | ICD-10-CM | POA: Insufficient documentation

## 2014-09-29 DIAGNOSIS — R6889 Other general symptoms and signs: Secondary | ICD-10-CM

## 2014-09-29 DIAGNOSIS — R531 Weakness: Secondary | ICD-10-CM

## 2014-09-29 NOTE — Therapy (Signed)
[facility] [address] [place], [place], [address] Phone: [phone]   Fax:  [phone]  Physical Therapy Treatment  Patient Details  Name: Adrian Neal MRN: [id number] Date of Birth: Jan 02, 1966 Referring Provider:  Anda Kraft, MD  Encounter Date: 09/29/2014      PT End of Session - 09/29/14 1229    Visit Number 13   Number of Visits 16   Date for PT Re-Evaluation 10/17/14   Authorization Type [employer] private, check # of visits after eval   PT Start Time 0930   PT Stop Time 1015   PT Time Calculation (min) 45 min   Equipment Utilized During Treatment Gait belt   Activity Tolerance Patient tolerated treatment well   Behavior During Therapy [facility] for tasks assessed/performed      Past Medical History  Diagnosis Date  . Hypertension   . Diabetes mellitus without complication     diagnosed at age 49  . 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 Eye surgery  1990    for retinopathy   . Cataract extraction w/ intraocular lens implant  1994    There were no vitals filed for this visit.  Visit Diagnosis:  Decreased strength, endurance, and mobility  Lack of coordination due to stroke  Abnormality of gait      Subjective Assessment - 09/29/14 0933    Subjective no changes or falls to report. pt has been ambulating at home with cane since approximately a week ago. pt reports his main difficulty at this time is walking to his car down his very sttep driveway. He is currently choosing to walk down steps to avoid the downhill  surface.    Patient Stated Goals pt reports he would like to someday be able to walk without a SPC   Currently in Pain? No/denies     Treatment   Gait Training: -Outdoor gait training on unlevel surfaces with inclines. Cues for cane management (specifically advancing cane in right right hand with L LE, as well  as maintaining cane in anterior position of COM) CGA for safety.  -Curb negotiation x 4 with cues for management of cane and CGA for safety.  Neuromuscular Re-education:  -Coordination exercises: toe taps onto 6 cones standing next to counter for support when necessary. [facility]one arranged in vertical and horizontal lines for patient to practice balance/weightshifting anterior/posterior and laterally. Emphasis on SLS on L LE. CGA required for safety. -Lateral weight shifting near counter: with visual targets placed on cabinets (sticky notes), pt instructed to reach with UEs to touch targets out of base of support. Pt encouraged to fully lift foot of ground when shifting to L and R. CGA for safety.  -Repetitive step training on targets on floor to encourage longer step length with more narrow/efficient BOS. CGA for safety.  -SLS with L side near wall to build confidence with balance (pt feels pull to left). CGA for safety. 4-5 second holds in SLS position. 10 reps. CGA for safety. -Ball toss standing on two red mats with feet close together  (not quite touching). Diona Foley is thrown to pt so he has to reach out of BOS and weight shift to maintain balance. CGA for safety.  -Ball toss on foam beam with feet apart. Diona Foley is thrown to pt so he has to reach out of BOS and weight shift to maintain balance. CGA for safety. -  Stair training x 4 steps with use of R railing. 5 reps of ascending and descending stairs with cues for reciprocal gait pattern. SBA.                           PT Short Term Goals - 09/19/14 1004    PT SHORT TERM GOAL #1   Title The patient will be indep with HEP for balance, and mobility.  Target date 09/16/2014   Time 4   Period Weeks   Status Achieved   PT SHORT TERM GOAL #2   Title The patient will improve Berg score from 30/56 up to 36/56 to demo decreased risk for falls.  Target date 09/16/2014   Baseline pt's [name] is currently 44/56   Time 4   Period Weeks   Status  Achieved   PT SHORT TERM GOAL #3   Title The patient will ambulate household distances/level surfaces (300 ft) without a device independently.  Target date 09/16/2014   Baseline partially met, due to SBA for safety with transitioning with [facility]   Time 4   Period Weeks   Status Partially Met   PT SHORT TERM GOAL #4   Title The patient will increase walking speed from 1.2 ft/sec up to 1.8 ft/sec to demo decreased risk for falls (with least restrictive device).  Target date 09/16/2014   Baseline pt currently 2.68 ft/second with SPC   Time 4   Period Weeks   Status Achieved   PT SHORT TERM GOAL #5   Title The patient will negotiate 4 steps x 3 reps with one handrail and reciprocal pattern modified indep. Target date 09/16/2014   Baseline supervision currently required for safety as pt reports he is still cautious with desending steps    Time 4   Period Weeks   Status Partially Met           PT Long Term Goals - 09/19/14 1003    PT LONG TERM GOAL #1   Title The patient will be indep with progression of HEP. Target date 10/17/2014   Time 8   Period Weeks   PT LONG TERM GOAL #2   Title The patient will improve stroke impact scale mobility by 15% (baseline 64%) to demo improved self perception of mobility.  Target date 10/17/2014   Time 8   Period Weeks   PT LONG TERM GOAL #3   Title The patient will improve Berg balance score from 30/56 up to 44/56 to demo decreasing risk for falls.  Target date 10/17/2014   Time 8   Period Weeks   PT LONG TERM GOAL #4   Title The patient will improve gait speed from 1.2 ft/sec up to 2.6 ft/sec to demo transition to "full community ambulator" classification of gait.  Target date 10/17/2014   Time 8   Period Weeks   PT LONG TERM GOAL #5   Title The patient will verbalize understanding of stroke risk factors and warning signs.  Target date 10/17/2014   Time 8   Period Weeks   PT LONG TERM GOAL #6   Title The patient will negotiate level and unlevel  community surfaces with least restrictive assistive device x 1200 ft modified indep.  Target date 10/17/2014   Time 8   Period Weeks               Plan - 09/29/14 1229    Clinical Impression Statement pt is progressioning towards goals  and demonstrated improvements with gait today realted to cane mangement during gait. Reviewed stair negotiation with patient as well as safety with curb management. pt's cane was adjusted to pt's height today which seemed to improve cane management during gait (place cane in more anterior position compared to laterally to side).   Pt will benefit from skilled therapeutic intervention in order to improve on the following deficits Abnormal gait;Difficulty walking;Postural dysfunction;Decreased balance;Decreased activity tolerance;Decreased mobility   Rehab Potential Good   PT Frequency 2x / week   PT Duration 8 weeks   PT Treatment/Interventions Therapeutic activities;Patient/family education;Therapeutic exercise;Manual techniques;Balance training;Gait training;Neuromuscular re-education;Stair training;Functional mobility training   PT Next Visit Plan balance training activities on Balance Master with anterior and posterior weightshifting focusing on somatosensory and vestibular training; gait training indoors on level surface with no AD; gait training outdoors on unlevel surfaces with SPC; emphasis on balance and weight shifting to left side; coordination activities    Consulted and Agree with Plan of Care Patient        Problem List Patient Active Problem List   Diagnosis Date Noted  . Abnormal LFTs 08/12/2014  . Ataxia   . CVA (cerebral infarction) 08/03/2014  . ARF (acute renal failure)   . Insulin dependent diabetes mellitus   . 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 Intractable nausea and vomiting 07/27/2014  . Vertigo 07/27/2014  . Essential hypertension 07/27/2014  . Type 1 diabetes mellitus 05/29/2010  . Hyperlipidemia  05/29/2010  . Benign essential HTN 05/29/2010  . PLANTAR FASCIITIS 05/29/2010    Fanny Dance 09/29/2014, 1:29 PM  [facility] [address] [place], [place], [address] Phone: [phone]   Fax:  [phone]

## 2014-10-04 ENCOUNTER — Ambulatory Visit: Payer: 59

## 2014-10-04 DIAGNOSIS — IMO0002 Reserved for concepts with insufficient information to code with codable children: Secondary | ICD-10-CM

## 2014-10-04 DIAGNOSIS — H814 Vertigo of central origin: Secondary | ICD-10-CM

## 2014-10-04 DIAGNOSIS — R6889 Other general symptoms and signs: Secondary | ICD-10-CM

## 2014-10-04 DIAGNOSIS — Z7409 Other reduced mobility: Secondary | ICD-10-CM | POA: Diagnosis not present

## 2014-10-04 DIAGNOSIS — R269 Unspecified abnormalities of gait and mobility: Secondary | ICD-10-CM

## 2014-10-04 DIAGNOSIS — R531 Weakness: Secondary | ICD-10-CM

## 2014-10-04 NOTE — Therapy (Signed)
[facility] [address] [place], [place], [address] Phone: [phone]   Fax:  [phone]  Physical Therapy Treatment  Patient Details  Name: Adrian Neal MRN: [id number] Date of Birth: Sep 23, 1965 Referring Provider:  Anda Kraft, MD  Encounter Date: 10/04/2014      PT End of Session - 10/04/14 1127    Visit Number 14   Number of Visits 16   Date for PT Re-Evaluation 10/17/14   Authorization Type [employer] private, check # of visits after eval   PT Start Time 0935   PT Stop Time 1015   PT Time Calculation (min) 40 min   Equipment Utilized During Treatment Gait belt   Activity Tolerance Patient tolerated treatment well   Behavior During Therapy [facility] for tasks assessed/performed      Past Medical History  Diagnosis Date  . Hypertension   . Diabetes mellitus without complication     diagnosed at age 53  . 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 Eye surgery  1990    for retinopathy   . Cataract extraction w/ intraocular lens implant  1994    There were no vitals filed for this visit.  Visit Diagnosis:  Abnormality of gait  Vertigo, central origin, unspecified laterality  Lack of coordination due to stroke  Decreased strength, endurance, and mobility      Subjective Assessment - 10/04/14 0938    Subjective pt reports no significant changes or falls. pt reports his "left pull" has been off and on the last few days, and it has also been a little more intense this past weekend.    Patient Stated Goals pt reports he would like to someday be able to walk without a SPC   Currently in Pain? No/denies         Treatment   Gait Training: -Amb 342 ft with SPC and SBA with increased gait velocity to promote longer step/stride length. Cues for LUE arm swing and appropriate cane management.  -Amb 228 ft with CGA and no AD with  increased gait velocity to promote longer step/stride length. Cues for LUE arm swing.   Neuromuscular Re-Ed:  Corner balance training activities on foam surface (SBA for safety):  -standing with feet apart -standing with feet apart and head turns (horizontal and vertical directions) -standing with feet together  -standing with feet together and head turns (horizontal and vertical directions) -standing with feet apart and eyes closed -standing with feet together and eyes closed   Balance activities with wall on left side: -SLS with lateral hip bumps (hip against wall in SLS<>hip moving against wall in SLS), practicing maintaining SLS on both R and L LE. CGA for safety.  -SLS with non-stance leg supported on top of small ball in front, maintaining balance and postural control. CGA for safety.     Stair Training -pt practiced negotiating 4 steps x 5 reps with reciprocal pattern and one railing for support. SBA for safety.   Balance activities at counter (SBA): -heel raises x 20 reps -toe raises x 20 reps                    PT Short Term Goals - 09/19/14 1004    PT SHORT TERM GOAL #1   Title The patient will be indep with HEP for balance, and mobility.  Target date 09/16/2014   Time 4  Period Weeks   Status Achieved   PT SHORT TERM GOAL #2   Title The patient will improve Berg score from 30/56 up to 36/56 to demo decreased risk for falls.  Target date 09/16/2014   Baseline pt's [name] is currently 44/56   Time 4   Period Weeks   Status Achieved   PT SHORT TERM GOAL #3   Title The patient will ambulate household distances/level surfaces (300 ft) without a device independently.  Target date 09/16/2014   Baseline partially met, due to SBA for safety with transitioning with [facility]   Time 4   Period Weeks   Status Partially Met   PT SHORT TERM GOAL #4   Title The patient will increase walking speed from 1.2 ft/sec up to 1.8 ft/sec to demo decreased risk for falls (with least  restrictive device).  Target date 09/16/2014   Baseline pt currently 2.68 ft/second with SPC   Time 4   Period Weeks   Status Achieved   PT SHORT TERM GOAL #5   Title The patient will negotiate 4 steps x 3 reps with one handrail and reciprocal pattern modified indep. Target date 09/16/2014   Baseline supervision currently required for safety as pt reports he is still cautious with desending steps    Time 4   Period Weeks   Status Partially Met           PT Long Term Goals - 09/19/14 1003    PT LONG TERM GOAL #1   Title The patient will be indep with progression of HEP. Target date 10/17/2014   Time 8   Period Weeks   PT LONG TERM GOAL #2   Title The patient will improve stroke impact scale mobility by 15% (baseline 64%) to demo improved self perception of mobility.  Target date 10/17/2014   Time 8   Period Weeks   PT LONG TERM GOAL #3   Title The patient will improve Berg balance score from 30/56 up to 44/56 to demo decreasing risk for falls.  Target date 10/17/2014   Time 8   Period Weeks   PT LONG TERM GOAL #4   Title The patient will improve gait speed from 1.2 ft/sec up to 2.6 ft/sec to demo transition to "full community ambulator" classification of gait.  Target date 10/17/2014   Time 8   Period Weeks   PT LONG TERM GOAL #5   Title The patient will verbalize understanding of stroke risk factors and warning signs.  Target date 10/17/2014   Time 8   Period Weeks   PT LONG TERM GOAL #6   Title The patient will negotiate level and unlevel community surfaces with least restrictive assistive device x 1200 ft modified indep.  Target date 10/17/2014   Time 8   Period Weeks               Plan - 10/04/14 1128    Clinical Impression Statement pt is continuing to make progress towards goals and demo improved steadiness with increased gait velocity. pt continues to demo safe negotiation of curbs and stairs. Much of treatment today focused on balance training to address pt's  feelings of imbalance. pt reports decreased L "pull" when ambulating and increased speeds during gait training.    Pt will benefit from skilled therapeutic intervention in order to improve on the following deficits Abnormal gait;Difficulty walking;Postural dysfunction;Decreased balance;Decreased activity tolerance;Decreased mobility   Rehab Potential Good   PT Frequency 2x / week   PT  Duration 8 weeks   PT Treatment/Interventions Therapeutic activities;Patient/family education;Therapeutic exercise;Manual techniques;Balance training;Gait training;Neuromuscular re-education;Stair training;Functional mobility training   PT Next Visit Plan balance training activities on Balance Master with anterior and posterior weightshifting focusing on somatosensory and vestibular training; gait training indoors on level surface with no AD; gait training outdoors on unlevel surfaces with SPC; emphasis on balance and weight shifting to left and right side; coordination activities    Consulted and Agree with Plan of Care Patient        Problem List Patient Active Problem List   Diagnosis Date Noted  . Abnormal LFTs 08/12/2014  . Ataxia   . CVA (cerebral infarction) 08/03/2014  . ARF (acute renal failure)   . Insulin dependent diabetes mellitus   . 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 Intractable nausea and vomiting 07/27/2014  . Vertigo 07/27/2014  . Essential hypertension 07/27/2014  . Type 1 diabetes mellitus 05/29/2010  . Hyperlipidemia 05/29/2010  . Benign essential HTN 05/29/2010  . PLANTAR FASCIITIS 05/29/2010    Fanny Dance 10/04/2014, 11:46 AM  [facility] [address] [place], [place], [address] Phone: [phone]   Fax:  [phone]

## 2014-10-05 ENCOUNTER — Telehealth: Payer: Self-pay | Admitting: Physical Therapy

## 2014-10-06 ENCOUNTER — Ambulatory Visit: Payer: 59 | Admitting: Physical Therapy

## 2014-10-06 DIAGNOSIS — Z7409 Other reduced mobility: Secondary | ICD-10-CM | POA: Diagnosis not present

## 2014-10-06 DIAGNOSIS — R269 Unspecified abnormalities of gait and mobility: Secondary | ICD-10-CM

## 2014-10-06 DIAGNOSIS — IMO0002 Reserved for concepts with insufficient information to code with codable children: Secondary | ICD-10-CM

## 2014-10-06 DIAGNOSIS — H814 Vertigo of central origin: Secondary | ICD-10-CM

## 2014-10-06 NOTE — Therapy (Signed)
[facility] [address] [place], [place], [address] Phone: [phone]   Fax:  [phone]  Physical Therapy Treatment  Patient Details  Name: Adrian Neal MRN: [id number] Date of Birth: 12-15-1965 Referring Provider:  Anda Kraft, MD  Encounter Date: 10/06/2014      PT End of Session - 10/06/14 2225    Visit Number 15   Number of Visits 16   Date for PT Re-Evaluation 10/17/14   Authorization Type [employer] private, check # of visits after eval   PT Start Time [id number]   PT Stop Time 1013   PT Time Calculation (min) 39 min   Equipment Utilized During Treatment Gait belt   Activity Tolerance Patient tolerated treatment well   Behavior During Therapy [facility] for tasks assessed/performed      Past Medical History  Diagnosis Date  . Hypertension   . Diabetes mellitus without complication     diagnosed at age 14  . 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 Eye surgery  1990    for retinopathy   . Cataract extraction w/ intraocular lens implant  1994    There were no vitals filed for this visit.  Visit Diagnosis:  No diagnosis found.      Subjective Assessment - 10/06/14 0937    Subjective No falls to report. Pt reports having walked a half mile yesterday. States that the walk took longer than expected secondary to inclined/declined surfaces, cars passing by. Pt reports noticing veering L during said walk.   Currently in Pain? No/denies       Treatment: Neuro Re-ed:  Standing at parallel bars, pt performed B reaching (2 x6 reps per condition) anterolaterally and across midline with BUE's to promote trunk rotation: - Large rocker board (oriented for A/P instability to simulate mobility of inclined/declined surfaces) with 1) feet shoulder width apart, 2) narrow BOS; 3) R semi tandem; 4) L semi tandem. - Large rocker board oriented for M/L  instability to simulate gait on unlevel surfaces: 5) feet shoulder width apart; 6) R semi tandem; 7) L semi tandem  Pt performed majority of activities without UE support but required intermittent use of parallel bars to recover balance. Required min guard-min A for stability/balance. Mirror utilized for [employer], postural awareness.   Therapeutic Activities: - Ambulation outdoors x550' over inclined, declined and unlevel (grass surface) without AD to decrease somatosensory input, increase pt reliance on vestibular input.  Pt required min guard to min A, due to decreased gait stability when ambulating over surface which placed LLE lower than RLE.        PT Short Term Goals - 09/19/14 1004    PT SHORT TERM GOAL #1   Title The patient will be indep with HEP for balance, and mobility.  Target date 09/16/2014   Time 4   Period Weeks   Status Achieved   PT SHORT TERM GOAL #2   Title The patient will improve Berg score from 30/56 up to 36/56 to demo decreased risk for falls.  Target date 09/16/2014   Baseline pt's [name] is currently 44/56   Time 4   Period Weeks   Status Achieved   PT SHORT TERM GOAL #3   Title The patient will ambulate household distances/level surfaces (300 ft) without a device independently.  Target date 09/16/2014   Baseline partially met, due to SBA for safety with transitioning  with SPC   Time 4   Period Weeks   Status Partially Met   PT SHORT TERM GOAL #4   Title The patient will increase walking speed from 1.2 ft/sec up to 1.8 ft/sec to demo decreased risk for falls (with least restrictive device).  Target date 09/16/2014   Baseline pt currently 2.68 ft/second with SPC   Time 4   Period Weeks   Status Achieved   PT SHORT TERM GOAL #5   Title The patient will negotiate 4 steps x 3 reps with one handrail and reciprocal pattern modified indep. Target date 09/16/2014   Baseline supervision currently required for safety as pt reports he is still cautious with  desending steps    Time 4   Period Weeks   Status Partially Met           PT Long Term Goals - 09/19/14 1003    PT LONG TERM GOAL #1   Title The patient will be indep with progression of HEP. Target date 10/17/2014   Time 8   Period Weeks   PT LONG TERM GOAL #2   Title The patient will improve stroke impact scale mobility by 15% (baseline 64%) to demo improved self perception of mobility.  Target date 10/17/2014   Time 8   Period Weeks   PT LONG TERM GOAL #3   Title The patient will improve Berg balance score from 30/56 up to 44/56 to demo decreasing risk for falls.  Target date 10/17/2014   Time 8   Period Weeks   PT LONG TERM GOAL #4   Title The patient will improve gait speed from 1.2 ft/sec up to 2.6 ft/sec to demo transition to "full community ambulator" classification of gait.  Target date 10/17/2014   Time 8   Period Weeks   PT LONG TERM GOAL #5   Title The patient will verbalize understanding of stroke risk factors and warning signs.  Target date 10/17/2014   Time 8   Period Weeks   PT LONG TERM GOAL #6   Title The patient will negotiate level and unlevel community surfaces with least restrictive assistive device x 1200 ft modified indep.  Target date 10/17/2014   Time 8   Period Weeks               Plan - 10/06/14 2229    Clinical Impression Statement Despite continued pt perception of being "pulled" to L side, pt exhbits consistent midline posture despite external variables/demands during ambulation.  Pt appears to benefit from use of visual feedback during challenging balance tasks. Pt noted to have some decrease in gait stability when LLE lower than RLE during ambulationn over incline. Continue per POC.   Pt will benefit from skilled therapeutic intervention in order to improve on the following deficits Abnormal gait;Difficulty walking;Postural dysfunction;Decreased balance;Decreased activity tolerance;Decreased mobility   Rehab Potential Good   PT Frequency 2x  / week   PT Duration 8 weeks   PT Treatment/Interventions Therapeutic activities;Patient/family education;Therapeutic exercise;Manual techniques;Balance training;Gait training;Neuromuscular re-education;Stair training;Functional mobility training   PT Next Visit Plan Recert or discharge, pending pt progress toward LTG's.   Consulted and Agree with Plan of Care Patient        Problem List Patient Active Problem List   Diagnosis Date Noted  . Abnormal LFTs 08/12/2014  . Ataxia   . CVA (cerebral infarction) 08/03/2014  . ARF (acute renal failure)   . Insulin dependent diabetes mellitus   . Cerebral thrombosis with cerebral  infarction 07/28/2014  . Intractable nausea and vomiting 07/27/2014  . Vertigo 07/27/2014  . Essential hypertension 07/27/2014  . Type 1 diabetes mellitus 05/29/2010  . Hyperlipidemia 05/29/2010  . Benign essential HTN 05/29/2010  . PLANTAR FASCIITIS 05/29/2010    Billie Ruddy, PT, DPT [facility] [address] [place], [place], [address] Phone: [phone]   Fax:  [phone] 10/06/2014, 10:41 PM

## 2014-10-10 ENCOUNTER — Encounter: Payer: Self-pay | Admitting: Occupational Therapy

## 2014-10-10 ENCOUNTER — Ambulatory Visit: Payer: 59 | Admitting: Physical Therapy

## 2014-10-10 ENCOUNTER — Ambulatory Visit: Payer: 59 | Admitting: Occupational Therapy

## 2014-10-10 ENCOUNTER — Encounter: Payer: Self-pay | Admitting: Physical Therapy

## 2014-10-10 DIAGNOSIS — H814 Vertigo of central origin: Secondary | ICD-10-CM

## 2014-10-10 DIAGNOSIS — R531 Weakness: Secondary | ICD-10-CM

## 2014-10-10 DIAGNOSIS — Z7409 Other reduced mobility: Secondary | ICD-10-CM | POA: Diagnosis not present

## 2014-10-10 DIAGNOSIS — IMO0002 Reserved for concepts with insufficient information to code with codable children: Secondary | ICD-10-CM

## 2014-10-10 DIAGNOSIS — R269 Unspecified abnormalities of gait and mobility: Secondary | ICD-10-CM

## 2014-10-10 DIAGNOSIS — R6889 Other general symptoms and signs: Secondary | ICD-10-CM

## 2014-10-10 NOTE — Therapy (Signed)
[facility] [address] [place], [place], [address] Phone: [phone]   Fax:  [phone]  Occupational Therapy Treatment  Patient Details  Name: Adrian Neal MRN: [id number] Date of Birth: 1965/11/08 Referring Provider:  Anda Kraft, MD  Encounter Date: 10/10/2014      OT End of Session - 10/10/14 1724    Visit Number 12   Number of Visits 17   Date for OT Re-Evaluation 10/15/14   Authorization Type U[employer]   OT Start Time 1446   OT Stop Time 1533   OT Time Calculation (min) 47 min   Activity Tolerance Patient tolerated treatment well   Behavior During Therapy [facility] for tasks assessed/performed      Past Medical History  Diagnosis Date  . Hypertension   . Diabetes mellitus without complication     diagnosed at age 84  . 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 Eye surgery  1990    for retinopathy   . Cataract extraction w/ intraocular lens implant  1994    There were no vitals filed for this visit.  Visit Diagnosis:  Lack of coordination due to stroke  Decreased strength, endurance, and mobility      Subjective Assessment - 10/10/14 1716    Subjective  My biggest concern is being on my feet for long shifts at work.  I still feel this push to the left when I am walking.   Patient Stated Goals Get my hand to start working better, improve endurance   Currently in Pain? No/denies   Pain Score 0-No pain            [facility] OT Assessment - 10/10/14 0001    Coordination   Box and Blocks 24.66                  OT Treatments/Exercises (OP) - 10/10/14 0001    Neurological Re-education Exercises   Other Exercises 1 Neuromuscular r eeducation to address weight shifting in standing.  Taking bilateral upper extremities out of support to allow functional reach or carry.  Patient with strong bias backward, yet responded  well to shift weight from heels to toes in dynamic standing condiditons.  Patient with one significant loss of balance and self recovery when turning while carrying 25 lb weighted box in quiet hallway.     Functional Reaching Activities   Mid Level Wworked to lift box from floor to table height incorporatring stable feet and trunk rotation.  Encouraged balanced squatting and return to stand with arms involved in carrying versus for mobility.     High Level Patient worked in a busy environment, and needed to be able to lift a 50ish lb. box from 12 inches from floor to shoulder height.  Worked with patient to facilitate a more balanced step pattern and encouraged weight shift toward versus away from target surface.  Patient able to lift ~ 25 lbs and repeat x 3.                   OT Education - 10/10/14 1724    Person(s) Educated Patient   Methods Explanation   Comprehension Verbalized understanding          OT Short Term Goals - 09/09/14 0949    OT SHORT TERM GOAL #1   Title Independent w/ HEP for coordination (due 09/15/14)   Time 4  Period Weeks   Status Achieved   OT SHORT TERM GOAL #2   Title Pt to simulate tub transfer with tub transfer bench safely and verbalize understanding of acquistion of DME   Time 4   Period Weeks   Status Deferred  Pt does not wish to pursue purchasing DME at this time   OT SHORT TERM GOAL #3   Title Pt to demo sufficient strength and endurance LUE to retrieve/replace 5 lb. object from overhead shelf x 10 reps without rest   Time 4   Period Weeks   Status Achieved  as of 09/06/14   OT SHORT TERM GOAL #4   Title Pt to perform complex money exchange with 90% or higher accuracy in prep for work related tasks   Time 4   Period Weeks   Status Achieved  as of 09/06/14   OT SHORT TERM GOAL #5   Title Improve LUE functional use as evidenced by performing 42 or greater blocks on Box & Blocks test   Baseline met 09/09/14-49 blocks   Time 4   Period  Weeks   Status Achieved           OT Long Term Goals - 10/10/14 1727    OT LONG TERM GOAL #1   Title Improve coordination as evidenced by performing 9 hole peg test in 25 sec. or under (due 10/15/14)   Baseline 30.81 sec.    Time 8   Period Weeks   Status Achieved   OT LONG TERM GOAL #2   Title Pt to perform dynamic standing tasks for light cleaning for 15 minutes or greater w/o rest or LOB    Time 8   Period Weeks   Status On-going   OT LONG TERM GOAL #3   Title Pt to perform divided attention tasks between physical and cognitive tasks with 90% or greater accuracy in prep for work related tasks   Time 8   Period Weeks   Status On-going               Plan - 10/10/14 1725    Clinical Impression Statement Patient demonstrates improved strength and both gross and fine motor coordination, and is progressing toward goals.   Pt will benefit from skilled therapeutic intervention in order to improve on the following deficits (Retired) Decreased coordination;Decreased endurance;Decreased knowledge of precautions;Decreased activity tolerance;Decreased knowledge of use of DME;Impaired UE functional use;Decreased cognition;Decreased mobility;Decreased strength   Rehab Potential Good   OT Frequency 2x / week   OT Duration 8 weeks   OT Treatment/Interventions Self-care/ADL training;DME and/or AE instruction;Patient/family education;Therapeutic exercises;Therapeutic activities;Neuromuscular education;Functional Mobility Training;Cognitive remediation/compensation;Energy conservation;Manual Therapy   Plan coordination, UE conditioning, dynamic standing, check goals   OT Home Exercise Plan coordination HEP issued 08/23/14, Theraband HEP LUE 09/19/14   Consulted and Agree with Plan of Care Patient        Problem List Patient Active Problem List   Diagnosis Date Noted  . Abnormal LFTs 08/12/2014  . Ataxia   . CVA (cerebral infarction) 08/03/2014  . ARF (acute renal failure)   .  Insulin dependent diabetes mellitus   . 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 Intractable nausea and vomiting 07/27/2014  . Vertigo 07/27/2014  . Essential hypertension 07/27/2014  . Type 1 diabetes mellitus 05/29/2010  . Hyperlipidemia 05/29/2010  . Benign essential HTN 05/29/2010  . PLANTAR FASCIITIS 05/29/2010    Mariah Milling, OTR/L 10/10/2014, 5:28 PM  [facility] [phone] [place], [place], [address] Phone: [phone]   Fax:  [phone]    

## 2014-10-10 NOTE — Therapy (Signed)
[facility] [address] [place], [place], [address] Phone: [phone]   Fax:  [phone]  Physical Therapy Treatment  Patient Details  Name: Adrian Neal MRN: [id number] Date of Birth: 12/30/1965 Referring Provider:  Anda Kraft, MD  Encounter Date: 10/10/2014      PT End of Session - 10/10/14 1407    Visit Number 16   Number of Visits 16   Date for PT Re-Evaluation 10/17/14   Authorization Type U[employer] private, check # of visits after eval   PT Start Time 1402   PT Stop Time 1445   PT Time Calculation (min) 43 min   Equipment Utilized During Treatment Gait belt   Activity Tolerance Patient tolerated treatment well   Behavior During Therapy [facility] for tasks assessed/performed      Past Medical History  Diagnosis Date  . Hypertension   . Diabetes mellitus without complication     diagnosed at age 79  . 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 Eye surgery  1990    for retinopathy   . Cataract extraction w/ intraocular lens implant  1994    There were no vitals filed for this visit.  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 No pain or falls to report. Was able to walk about an half mile again on Friday to get his son from the school down the road.    Currently in Pain? No/denies           [facility] Adult PT Treatment/Exercise - 10/10/14 1417    Ambulation/Gait   Ambulation/Gait Yes   Ambulation/Gait Assistance 4: Min guard;5: Supervision   Ambulation/Gait Assistance Details occasional stagger noted, occasional scuffing on uneven surfaces   Ambulation Distance (Feet) 1000 Feet   Assistive device Straight cane   Gait Pattern Decreased step length - right;Decreased step  length - left;Decreased trunk rotation;Wide base of support   Ambulation Surface Level;Indoor;Unlevel;Outdoor;Paved;Gravel;Grass   Gait velocity 10.41 sec's= 3.15 ft/sec with cane   Berg Balance Test   Sit to Stand Able to stand without using hands and stabilize independently   Standing Unsupported Able to stand safely 2 minutes   Sitting with Back Unsupported but Feet Supported on Floor or Stool Able to sit safely and securely 2 minutes   Stand to Sit Sits safely with minimal use of hands   Transfers Able to transfer safely, minor use of hands   Standing Unsupported with Eyes Closed Able to stand 10 seconds safely   Standing Ubsupported with Feet Together Able to place feet together independently and stand for 1 minute with supervision   From Standing, Reach Forward with Outstretched Arm Can reach confidently >25 cm (10")   From Standing Position, Pick up Object from Floor Able to pick up shoe safely and easily   From Standing Position, Turn to Look Behind Over each Shoulder Looks behind one side only/other side shows less weight shift  left> right   Turn 360 Degrees Able to turn 360 degrees safely one side only in 4 seconds or less  3.63 left; 4.06right   Standing Unsupported, Alternately Place Feet on Step/Stool Able to stand independently and safely and complete 8 steps in 20 seconds  16.32   Standing  Unsupported, One Foot in [place] to take small step independently and hold 30 seconds   Standing on One Leg Tries to lift leg/unable to hold 3 seconds but remains standing independently   Total Score 48     Self Care: Pt educated on CVA risk factors and warning signs. Written education provided.        PT Short Term Goals - 09/19/14 1004    PT SHORT TERM GOAL #1   Title The patient will be indep with HEP for balance, and mobility.  Target date 09/16/2014   Time 4   Period Weeks   Status Achieved   PT SHORT TERM GOAL #2   Title The patient will improve Berg score from 30/56 up  to 36/56 to demo decreased risk for falls.  Target date 09/16/2014   Baseline pt's [name] is currently 44/56   Time 4   Period Weeks   Status Achieved   PT SHORT TERM GOAL #3   Title The patient will ambulate household distances/level surfaces (300 ft) without a device independently.  Target date 09/16/2014   Baseline partially met, due to SBA for safety with transitioning with [facility]   Time 4   Period Weeks   Status Partially Met   PT SHORT TERM GOAL #4   Title The patient will increase walking speed from 1.2 ft/sec up to 1.8 ft/sec to demo decreased risk for falls (with least restrictive device).  Target date 09/16/2014   Baseline pt currently 2.68 ft/second with SPC   Time 4   Period Weeks   Status Achieved   PT SHORT TERM GOAL #5   Title The patient will negotiate 4 steps x 3 reps with one handrail and reciprocal pattern modified indep. Target date 09/16/2014   Baseline supervision currently required for safety as pt reports he is still cautious with desending steps    Time 4   Period Weeks   Status Partially Met           PT Long Term Goals - 10/10/14 1408    PT LONG TERM GOAL #1   Title The patient will be indep with progression of HEP. Target date 10/17/2014   Baseline 10/10/14: met    Status Achieved   PT LONG TERM GOAL #2   Title The patient will improve stroke impact scale mobility by 15% (baseline 64%) to demo improved self perception of mobility.  Target date 10/17/2014   Status On-going   PT LONG TERM GOAL #3   Title The patient will improve Berg balance score from 30/56 up to 44/56 to demo decreasing risk for falls.  Target date 10/17/2014   Baseline 10/10/14: scored 48/56 today   Status Achieved   PT LONG TERM GOAL #4   Title The patient will improve gait speed from 1.2 ft/sec up to 2.6 ft/sec to demo transition to "full community ambulator" classification of gait.  Target date 10/17/2014   Baseline 10/10/14: 3.15 ft/sec with cane today   Status Achieved   PT LONG TERM  GOAL #5   Title The patient will verbalize understanding of stroke risk factors and warning signs.  Target date 10/17/2014   Baseline 10/10/14: met today   Status Achieved   PT LONG TERM GOAL #6   Title The patient will negotiate level and unlevel community surfaces with least restrictive assistive device x 1200 ft modified indep.  Target date 10/17/2014   Baseline 10/10/14: pt still needs min assist to min guard assist on unlevel surfaces  Period Weeks   Status Not Met           PT Education - 10/10/14 1929    Education provided Yes   Education Details HEP- CVA risk factors and warning signs   Person(s) Educated Patient   Methods Explanation;Handout   Comprehension Verbalized understanding;Returned demonstration            Plan - 10/10/14 1407    Clinical Impression Statement Pt has met some LTG's, progressing toward others. Still with decreased balance on complaint surfaces and without use of AD with gait.    Pt will benefit from skilled therapeutic intervention in order to improve on the following deficits Abnormal gait;Difficulty walking;Postural dysfunction;Decreased balance;Decreased activity tolerance;Decreased mobility   Rehab Potential Good   PT Frequency 2x / week   PT Duration 8 weeks   PT Treatment/Interventions Therapeutic activities;Patient/family education;Therapeutic exercise;Manual techniques;Balance training;Gait training;Neuromuscular re-education;Stair training;Functional mobility training   PT Next Visit Plan Recert or discharge, pending pt progress toward LTG's after seeing PT next visit   Consulted and Agree with Plan of Care Patient        Problem List Patient Active Problem List   Diagnosis Date Noted  . Abnormal LFTs 08/12/2014  . Ataxia   . CVA (cerebral infarction) 08/03/2014  . ARF (acute renal failure)   . Insulin dependent diabetes mellitus   . 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 Intractable nausea and vomiting  07/27/2014  . Vertigo 07/27/2014  . Essential hypertension 07/27/2014  . Type 1 diabetes mellitus 05/29/2010  . Hyperlipidemia 05/29/2010  . Benign essential HTN 05/29/2010  . PLANTAR FASCIITIS 05/29/2010    Willow Ora 10/10/2014, 7:29 PM  Willow Ora, PTA, [facility] [address], [place], [place] [address] [phone] 10/10/2014, 7:29 PM

## 2014-10-10 NOTE — Patient Instructions (Signed)
Ischemic Stroke °A stroke (cerebrovascular accident) is the sudden death of brain tissue. It is a medical emergency. A stroke can cause permanent loss of brain function. This can cause problems with different parts of your body. A transient ischemic attack (TIA) is different because it does not cause permanent damage. A TIA is a short-lived problem of poor blood flow affecting a part of the brain. A TIA is also a serious problem because having a TIA greatly increases the chances of having a stroke. When symptoms first develop, you cannot know if the problem might be a stroke or a TIA. °CAUSES  °A stroke is caused by a decrease of oxygen supply to an area of your brain. It is usually the result of a small blood clot or collection of cholesterol or fat (plaque) that blocks blood flow in the brain. A stroke can also be caused by blocked or damaged carotid arteries.  °RISK FACTORS °· High blood pressure (hypertension). °· High cholesterol. °· Diabetes mellitus. °· Heart disease. °· The buildup of plaque in the blood vessels (peripheral artery disease or atherosclerosis). °· The buildup of plaque in the blood vessels providing blood and oxygen to the brain (carotid artery stenosis). °· An abnormal heart rhythm (atrial fibrillation). °· Obesity. °· Smoking. °· Taking oral contraceptives (especially in combination with smoking). °· Physical inactivity. °· A diet high in fats, salt (sodium), and calories. °· Alcohol use. °· Use of illegal drugs (especially cocaine and methamphetamine). °· Being African American. °· Being over the age of 55. °· Family history of stroke. °· Previous history of blood clots, stroke, TIA, or heart attack. °· Sickle cell disease. °SYMPTOMS  °These symptoms usually develop suddenly, or may be newly present upon awakening from sleep: °· Sudden weakness or numbness of the face, arm, or leg, especially on one side of the body. °· Sudden trouble walking or difficulty moving arms or legs. °· Sudden  confusion. °· Sudden personality changes. °· Trouble speaking (aphasia) or understanding. °· Difficulty swallowing. °· Sudden trouble seeing in one or both eyes. °· Double vision. °· Dizziness. °· Loss of balance or coordination. °· Sudden severe headache with no known cause. °· Trouble reading or writing. °DIAGNOSIS  °Your health care provider can often determine the presence or absence of a stroke based on your symptoms, history, and physical exam. Computed tomography (CT) of the brain is usually performed to confirm the stroke, determine causes, and determine stroke severity. Other tests may be done to find the cause of the stroke. These tests may include: °· Electrocardiography. °· Continuous heart monitoring. °· Echocardiography. °· Carotid ultrasonography. °· Magnetic resonance imaging (MRI). °· A scan of the brain circulation. °· Blood tests. °PREVENTION  °The risk of a stroke can be decreased by appropriately treating high blood pressure, high cholesterol, diabetes, heart disease, and obesity and by quitting smoking, limiting alcohol, and staying physically active. °TREATMENT  °Time is of the essence. It is important to seek treatment at the first sign of these symptoms because you may receive a medicine to dissolve the clot (thrombolytic) that cannot be given if too much time has passed since your symptoms began. Even if you do not know when your symptoms began, get treatment as soon as possible as there are other treatment options available including oxygen, intravenous (IV) fluids, and medicines to thin the blood (anticoagulants). Treatment of stroke depends on the duration, severity, and cause of your symptoms. Medicines and dietary changes may be used to address diabetes, high blood   pressure, and other risk factors. Physical, speech, and occupational therapists will assess you and work with you to improve any functions impaired by the stroke. Measures will be taken to prevent short-term and long-term  complications, including infection from breathing foreign material into the lungs (aspiration pneumonia), blood clots in the legs, bedsores, and falls. Rarely, surgery may be needed to remove large blood clots or to open up blocked arteries. °HOME CARE INSTRUCTIONS  °· Take medicines only as directed by your health care provider. Follow the directions carefully. Medicines may be used to control risk factors for a stroke. Be sure you understand all your medicine instructions. °· You may be told to take a medicine to thin the blood, such as aspirin or the anticoagulant warfarin. Warfarin needs to be taken exactly as instructed. °¨ Too much and too little warfarin are both dangerous. Too much warfarin increases the risk of bleeding. Too little warfarin continues to allow the risk for blood clots. While taking warfarin, you will need to have regular blood tests to measure your blood clotting time. These blood tests usually include both the PT and INR tests. The PT and INR results allow your health care provider to adjust your dose of warfarin. The dose can change for many reasons. It is critically important that you take warfarin exactly as prescribed, and that you have your PT and INR levels drawn exactly as directed. °¨ Many foods, especially foods high in vitamin K, can interfere with warfarin and affect the PT and INR results. Foods high in vitamin K include spinach, kale, broccoli, cabbage, collard and turnip greens, brussels sprouts, peas, cauliflower, seaweed, and parsley, as well as beef and pork liver, green tea, and soybean oil. You should eat a consistent amount of foods high in vitamin K. Avoid major changes in your diet, or notify your health care provider before changing your diet. Arrange a visit with a dietitian to answer your questions. °¨ Many medicines can interfere with warfarin and affect the PT and INR results. You must tell your health care provider about any and all medicines you take. This  includes all vitamins and supplements. Be especially cautious with aspirin and anti-inflammatory medicines. Do not take or discontinue any prescribed or over-the-counter medicine except on the advice of your health care provider or pharmacist. °¨ Warfarin can have side effects, such as excessive bruising or bleeding. You will need to hold pressure over cuts for longer than usual. Your health care provider or pharmacist will discuss other potential side effects. °¨ Avoid sports or activities that may cause injury or bleeding. °¨ Be mindful when shaving, flossing your teeth, or handling sharp objects. °¨ Alcohol can change the body's ability to handle warfarin. It is best to avoid alcoholic drinks or consume only very small amounts while taking warfarin. Notify your health care provider if you change your alcohol intake. °¨ Notify your dentist or other health care providers before procedures. °· If swallow studies have determined that your swallowing reflex is present, you should eat healthy foods. Including 5 or more servings of fruits and vegetables a day may reduce the risk of stroke. Foods may need to be a certain consistency (soft or pureed), or small bites may need to be taken in order to avoid aspirating or choking. Certain dietary changes may be advised to address high blood pressure, high cholesterol, diabetes, or obesity. °¨ Food choices that are low in sodium, saturated fat, trans fat, and cholesterol are recommended to manage high blood pressure. °¨   Food choies that are high in fiber, and low in saturated fat, trans fat, and cholesterol may control cholesterol levels. °¨ Controlling carbohydrates and sugar intake is recommended to manage diabetes. °¨ Reducing calorie intake and making food choices that are low in sodium, saturated fat, trans fat, and cholesterol are recommended to manage obesity. °· Maintain a healthy weight. °· Stay physically active. It is recommended that you get at least 30 minutes of  activity on all or most days. °· Do not use any tobacco products including cigarettes, chewing tobacco, or electronic cigarettes. °· Limit alcohol use even if you are not taking warfarin. Moderate alcohol use is considered to be: °¨ No more than 2 drinks each day for men. °¨ No more than 1 drink each day for nonpregnant women. °· Home safety. A safe home environment is important to reduce the risk of falls. Your health care provider may arrange for specialists to evaluate your home. Having grab bars in the bedroom and bathroom is often important. Your health care provider may arrange for equipment to be used at home, such as raised toilets and a seat for the shower. °· Physical, occupational, and speech therapy. Ongoing therapy may be needed to maximize your recovery after a stroke. If you have been advised to use a walker or a cane, use it at all times. Be sure to keep your therapy appointments. °· Follow all instructions for follow-up with your health care provider. This is very important. This includes any referrals, physical therapy, rehabilitation, and lab tests. Proper follow-up can prevent another stroke from occurring. °SEEK MEDICAL CARE IF: °· You have personality changes. °· You have difficulty swallowing. °· You are seeing double. °· You have dizziness. °· You have a fever. °· You have skin breakdown. °SEEK IMMEDIATE MEDICAL CARE IF:  °Any of these symptoms may represent a serious problem that is an emergency. Do not wait to see if the symptoms will go away. Get medical help right away. Call your local emergency services (911 in [place] Do not drive yourself to the hospital. °· You have sudden weakness or numbness of the face, arm, or leg, especially on one side of the body. °· You have sudden trouble walking or difficulty moving arms or legs. °· You have sudden confusion. °· You have trouble speaking (aphasia) or understanding. °· You have sudden trouble seeing in one or both eyes. °· You have a loss of  balance or coordination. °· You have a sudden, severe headache with no known cause. °· You have new chest pain or an irregular heartbeat. °· You have a partial or total loss of consciousness. °Document Released: 04/15/2005 Document Revised: 08/30/2013 Document Reviewed: 11/24/2011 °[employer]® Patient Information ©2015 [employer]. This information is not intended to replace advice given to you by your health care provider. Make sure you discuss any questions you have with your health care provider. ° °

## 2014-10-12 ENCOUNTER — Ambulatory Visit: Payer: 59 | Admitting: Rehabilitative and Restorative Service Providers"

## 2014-10-12 ENCOUNTER — Ambulatory Visit: Payer: 59 | Admitting: Occupational Therapy

## 2014-10-12 DIAGNOSIS — IMO0002 Reserved for concepts with insufficient information to code with codable children: Secondary | ICD-10-CM

## 2014-10-12 DIAGNOSIS — Z7409 Other reduced mobility: Principal | ICD-10-CM

## 2014-10-12 DIAGNOSIS — R6889 Other general symptoms and signs: Secondary | ICD-10-CM

## 2014-10-12 DIAGNOSIS — R531 Weakness: Secondary | ICD-10-CM

## 2014-10-12 DIAGNOSIS — H814 Vertigo of central origin: Secondary | ICD-10-CM

## 2014-10-12 DIAGNOSIS — R269 Unspecified abnormalities of gait and mobility: Secondary | ICD-10-CM

## 2014-10-12 DIAGNOSIS — I69319 Unspecified symptoms and signs involving cognitive functions following cerebral infarction: Secondary | ICD-10-CM

## 2014-10-12 NOTE — Therapy (Addendum)
[facility] [address] [place], [place], [address] Phone: [phone]   Fax:  [phone]  Physical Therapy Treatment  Patient Details  Name: Adrian Neal MRN: [id number] Date of Birth: 07/26/1965 Referring Provider:  Anda Kraft, MD  Encounter Date: 10/12/2014      PT End of Session - 10/12/14 1338    Visit Number 17   Number of Visits 24   Date for PT Re-Evaluation 10/17/14   PT Start Time 5852   PT Stop Time 1100   PT Time Calculation (min) 45 min   Equipment Utilized During Treatment Gait belt   Activity Tolerance Patient tolerated treatment well   Behavior During Therapy [facility] for tasks assessed/performed      Past Medical History  Diagnosis Date  . Hypertension   . Diabetes mellitus without complication     diagnosed at age 55  . 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 Eye surgery  1990    for retinopathy   . Cataract extraction w/ intraocular lens implant  1994    There were no vitals filed for this visit.  Visit Diagnosis:  Abnormality of gait  Vertigo, central origin, unspecified laterality  Lack of coordination due to stroke  Decreased strength, endurance, and mobility      Subjective Assessment - 10/12/14 1020    Subjective Pt reports no changes, falls or complaints. pt states he walked around his yard a bit yesterday with his cane. pt states it is harder to walk on left side of road compared to right due to slope. Left sided pull is currently 2/10, with some variability.    Patient Stated Goals pt reports he would like to someday be able to walk without a SPC   Currently in Pain? No/denies        Treatment    Gait Training: -Amb 228 ft with CGA and no AD on indoor level surface with increased gait velocity to promote longer step/stride length. Cues for LUE arm swing. -Ambulation outdoors x550' over  inclined, declined and unlevel (grass surface) without AD to decrease somatosensory input, increase pt reliance on vestibular input.  Pt required min guard to min A, due to decreased gait stability when ambulating over surface which placed LLE lower than RLE.  Neuromuscular Re-Ed: At parallel bars with CGA- with pt intermittently using bars to recover balance: -pt practiced L weightbearing activity by placing R foot ontop of soccer ball and then instructed to roll ball forwards and backwards while maintaining balance. Pt then practiced L weightbearing with small rockerboard under R LE and pt cued to move board in anteior/posterior direction to challenge postural control and balance. Mirror provided for visual feedback.  -Large rocker board (oriented for A/P instability to simulate mobility of inclined/declined surfaces with feet shoulder width apart -Large rocker board oriented for M/L instability to simulate gait on unlevel surfaces with feet shoulder width apart -Feet shoulder width apart and feet together stance on ramp with both LLE and RLE on lower surface. Pt cues to attempt correct posture to midline and maintain balance for single minute intervals.                   PT Short Term Goals - 10/13/14 1043    PT SHORT TERM GOAL #1   Title The patient will be indep with HEP for balance, and mobility.  Target date  09/16/2014   Time 4   Period Weeks   Status Achieved   PT SHORT TERM GOAL #2   Title The patient will improve Berg score from 30/56 up to 36/56 to demo decreased risk for falls.  Target date 09/16/2014   Baseline pt's [name] is currently 44/56   Time 4   Period Weeks   Status Achieved   PT SHORT TERM GOAL #3   Title The patient will ambulate household distances/level surfaces (300 ft) without a device independently.  Target date 09/16/2014   Baseline partially met, due to SBA for safety with transitioning with [facility]   Time 4   Period Weeks   Status Partially Met   PT  SHORT TERM GOAL #4   Title The patient will increase walking speed from 1.2 ft/sec up to 1.8 ft/sec to demo decreased risk for falls (with least restrictive device).  Target date 09/16/2014   Baseline pt currently 2.68 ft/second with SPC   Time 4   Period Weeks   Status Achieved   PT SHORT TERM GOAL #5   Title The patient will negotiate 4 steps x 3 reps with one handrail and reciprocal pattern modified indep. 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    Time 4   Period Weeks   Status New   PT SHORT TERM GOAL #7   Title pt will ambulate 10 minutes (as pt is currently picking up his children from school which requires a 10 minute walk) on outodoor, unlevel surfaces using SPC and demonstrate no LOB. July 14th.   Time 4   Period Weeks   Status New   PT SHORT TERM GOAL #8   Title --   Time --   Period --   Status --           PT Long Term Goals - 10/13/14 1440    PT LONG TERM GOAL #2   Title The patient will improve stroke impact scale mobility by 15% (baseline 64%) to demo improved self perception of mobility.  Target date 10/17/2014   Time 8   Period Weeks   Status On-going   PT LONG TERM GOAL #6   Title The patient will negotiate level and unlevel community surfaces with least restrictive assistive device x 1200 ft modified indep.  Target date 10/17/2014   Time 8   Period Weeks   Status On-going               Plan - 10/12/14 1338    Clinical Impression Statement pt was challenged during treatment session today, but tolerated treatment session well. pt still demo unsteadiness with unlevel surfaces and  weightbearing on L LE. Ankle control during balance activites emphasized today. pt is continuing to make progress towards LTGs. New STGs and continuing LTGs x 4-8 more weeks to progress patient's current functional status.   Pt will benefit from skilled therapeutic intervention in order to improve on the following deficits Abnormal gait;Difficulty walking;Postural dysfunction;Decreased balance;Decreased activity tolerance;Decreased mobility   Rehab Potential Good   PT Frequency 2x / week  PT Duration 8 weeks   PT Treatment/Interventions Therapeutic activities;Patient/family education;Therapeutic exercise;Manual techniques;Balance training;Gait training;Neuromuscular re-education;Stair training;Functional mobility training   PT Next Visit Plan balance control with head turns/eyes closed; gait training on uneven surfaces; weightbearing and weightshifting to left    Consulted and Agree with Plan of Care Patient        Problem List Patient Active Problem List   Diagnosis Date Noted  . Abnormal LFTs 08/12/2014  . Ataxia   . CVA (cerebral infarction) 08/03/2014  . ARF (acute renal failure)   . Insulin dependent diabetes mellitus   . 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 Intractable nausea and vomiting 07/27/2014  . Vertigo 07/27/2014  . Essential hypertension 07/27/2014  . Type 1 diabetes mellitus 05/29/2010  . Hyperlipidemia 05/29/2010  . Benign essential HTN 05/29/2010  . PLANTAR FASCIITIS 05/29/2010  Fanny Dance, SPT  WEAVER,CHRISTINA, PT  This entire session was performed under direct supervision and direction of a licensed therapist/therapist assistant . I have personally read, edited and approve of the note as written.  [place] 10/13/2014, 2:43 PM  [facility] [address] [place], [place], [address] Phone: [phone]   Fax:  [phone]

## 2014-10-12 NOTE — Therapy (Signed)
[facility] [address] [place], [place], [address] Phone: [phone]   Fax:  [phone]  Occupational Therapy Treatment  Patient Details  Name: Adrian Neal MRN: [id number] Date of Birth: [birth year] Referring Provider:  Anda Kraft, MD  Encounter Date: 10/12/2014      OT End of Session - 10/12/14 1205    Visit Number 13   Number of Visits 17   Date for OT Re-Evaluation 10/29/14  extended 2 weeks from 10/15/14 secondary to 2 missed weeks of O.T.    Authorization Type [employer]   OT Start Time 1105   OT Stop Time 1145   OT Time Calculation (min) 40 min   Equipment Utilized During Treatment GAIT BELT   Activity Tolerance Patient tolerated treatment well      Past Medical History  Diagnosis Date  . Hypertension   . Diabetes mellitus without complication     diagnosed at age 4  . 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 Eye surgery  1990    for retinopathy   . Cataract extraction w/ intraocular lens implant  1994    There were no vitals filed for this visit.  Visit Diagnosis:  Decreased strength, endurance, and mobility  Lack of coordination due to stroke  Cognitive deficits following cerebral infarction      Subjective Assessment - 10/12/14 1109    Subjective  My biggest problem now is balance and endurance for working   Patient Stated Goals Get my hand to start working better, improve endurance   Currently in Pain? No/denies       TREATMENT:   Pt ambulating while tossing ball LUE for gross motor coordination while performing cognitive tasks for divided attention in prep for work related tasks. Pt with moderate drops of ball Lt hand, and only min difficulty with cognitive task (listing states alphabetically), however noted when distracted, forgot what letter he was on. Pt then ambulating while dribbling large ball with LOB  x1. Dribbling and catching large ball in place, followed by tossing/catching small ball both hands simultaneously with mod drops Lt hand (countertop support at hips for balance). Pt tossing medium sized ball up and catching with both hands w/ LOB x1 posteriorly when looking up. Pt carrying 25# box around table, from floor to tabletop, tabletop to higher level shelf in prep for work related tasks with LOB x1.                          OT Short Term Goals - 09/09/14 0949    OT SHORT TERM GOAL #1   Title Independent w/ HEP for coordination (due 09/15/14)   Time 4   Period Weeks   Status Achieved   OT SHORT TERM GOAL #2   Title Pt to simulate tub transfer with tub transfer bench safely and verbalize understanding of acquistion of DME   Time 4   Period Weeks   Status Deferred  Pt does not wish to pursue purchasing DME at this time   OT SHORT TERM GOAL #3   Title Pt to demo sufficient strength and endurance LUE to retrieve/replace 5 lb. object from overhead shelf x 10 reps without rest   Time 4   Period Weeks   Status Achieved  as of 09/06/14   OT SHORT TERM GOAL #4   Title Pt to perform complex  money exchange with 90% or higher accuracy in prep for work related tasks   Time 4   Period Weeks   Status Achieved  as of 09/06/14   OT SHORT TERM GOAL #5   Title Improve LUE functional use as evidenced by performing 42 or greater blocks on Box & Blocks test   Baseline met 09/09/14-49 blocks   Time 4   Period Weeks   Status Achieved           OT Long Term Goals - 10/12/14 1207    OT LONG TERM GOAL #1   Title Improve coordination as evidenced by performing 9 hole peg test in 25 sec. or under (due 10/15/14)   Baseline 30.81 sec.    Time 8   Period Weeks   Status Achieved  24.66 sec. on 10/10/14   OT LONG TERM GOAL #2   Title Pt to perform dynamic standing tasks for light cleaning for 15 minutes or greater w/o rest or LOB    Time 8   Period Weeks   Status On-going   OT  LONG TERM GOAL #3   Title Pt to perform divided attention tasks between physical and cognitive tasks with 90% or greater accuracy in prep for work related tasks   Time 8   Period Weeks   Status On-going               Plan - 10/12/14 1208    Clinical Impression Statement Pt is making progress with functional mobility and endurance. Pt had less LOB today   Plan continue functional mobility, strength/endurance, divided attention   OT Home Exercise Plan coordination HEP issued 08/23/14, Theraband HEP LUE 09/19/14        Problem List Patient Active Problem List   Diagnosis Date Noted  . Abnormal LFTs 08/12/2014  . Ataxia   . CVA (cerebral infarction) 08/03/2014  . ARF (acute renal failure)   . Insulin dependent diabetes mellitus   . 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 Intractable nausea and vomiting 07/27/2014  . Vertigo 07/27/2014  . Essential hypertension 07/27/2014  . Type 1 diabetes mellitus 05/29/2010  . Hyperlipidemia 05/29/2010  . Benign essential HTN 05/29/2010  . PLANTAR FASCIITIS 05/29/2010    Carey Bullocks, OTR/L 10/12/2014, 12:10 PM  [facility] [address] [place], [place], [address] Phone: [phone]   Fax:  [phone]

## 2014-10-14 ENCOUNTER — Ambulatory Visit: Payer: 59 | Admitting: Physical Therapy

## 2014-10-17 ENCOUNTER — Other Ambulatory Visit: Payer: Self-pay | Admitting: Physical Medicine and Rehabilitation

## 2014-10-18 ENCOUNTER — Encounter: Payer: Self-pay | Admitting: Physical Therapy

## 2014-10-18 ENCOUNTER — Ambulatory Visit: Payer: 59 | Admitting: Physical Therapy

## 2014-10-18 DIAGNOSIS — R269 Unspecified abnormalities of gait and mobility: Secondary | ICD-10-CM

## 2014-10-18 DIAGNOSIS — Z7409 Other reduced mobility: Secondary | ICD-10-CM | POA: Diagnosis not present

## 2014-10-18 DIAGNOSIS — IMO0002 Reserved for concepts with insufficient information to code with codable children: Secondary | ICD-10-CM

## 2014-10-18 DIAGNOSIS — R531 Weakness: Secondary | ICD-10-CM

## 2014-10-18 NOTE — Therapy (Signed)
[facility] [address] [place], [place], [address] Phone: [phone]   Fax:  [phone]  Physical Therapy Treatment  Patient Details  Name: TOAN MORT MRN: [id number] Date of Birth: 14-Jul-1965 Referring Provider:  Anda Kraft, MD  Encounter Date: 10/18/2014      PT End of Session - 10/18/14 0938    Visit Number 18   Number of Visits 24   Date for PT Re-Evaluation 10/17/14   PT Start Time 0930   PT Stop Time 1017   PT Time Calculation (min) 47 min   Equipment Utilized During Treatment Gait belt   Activity Tolerance Patient tolerated treatment well   Behavior During Therapy [facility] for tasks assessed/performed      Past Medical History  Diagnosis Date  . Hypertension   . Diabetes mellitus without complication     diagnosed at age 86  . 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 Eye surgery  1990    for retinopathy   . Cataract extraction w/ intraocular lens implant  1994    There were no vitals filed for this visit.  Visit Diagnosis:  Decreased strength, endurance, and mobility  Lack of coordination due to stroke  Abnormality of gait      Subjective Assessment - 10/18/14 0937    Subjective No new complaints. No falls or pain to report.   Currently in Pain? No/denies     Treatment: Gait: Without AD on indoor level and outdoor unlevel (gravel, grass, and pavement) x 660 feet with min guard assist indoors and up to min assist on outdoor surfaces. Increased assist on grass. Backwards gait on grass ~30 feet x 3 laps, with low knee marches forward.  Neuro Re-ed: Blue foam beam in parallel bars: min guard assist to min assist for balance with cues on posture and ex form Side stepping x 3 laps each way with little to no UE support on bars Tandem gait fwd/bwd x 3 laps each way with little to no UE support on bars Standing  with feet across beam: alternating fwd heel taps and bwd toe taps x 10 reps each bil legs  Hurdles next to counter top Reciprocal stepping over hurdles of varied heights x 6 laps with counter support as needed and up to min assist for balance. Cues for increased hip flexion to allow better clearance of hurdles without adduction/circumduction with advancing legs.  blue mat on ramp: performed both facing up and down ramp. started with feet apart and then performed each with feet together: eyes closed no head movements, eyes open head nods/shakes, eyes closed head nods/shakes. Up to min assist needed for balance.         PT Short Term Goals - 10/13/14 1043    PT SHORT TERM GOAL #1   Title The patient will be indep with HEP for balance, and mobility.  Target date 09/16/2014   Time 4   Period Weeks   Status Achieved   PT SHORT TERM GOAL #2   Title The patient will improve Berg score from 30/56 up to 36/56 to demo decreased risk for falls.  Target date 09/16/2014   Baseline pt's [name] is currently 44/56   Time 4   Period Weeks   Status Achieved   PT SHORT TERM GOAL #3   Title The patient will ambulate household distances/level surfaces (300 ft) without a device independently.  Target date 09/16/2014   Baseline partially met, due to SBA for safety with transitioning with SPC   Time 4   Period Weeks   Status Partially Met   PT SHORT TERM GOAL #4   Title The patient will increase walking speed from 1.2 ft/sec up to 1.8 ft/sec to demo decreased risk for falls (with least restrictive device).  Target date 09/16/2014   Baseline pt currently 2.68 ft/second with SPC   Time 4   Period Weeks   Status Achieved   PT SHORT TERM GOAL #5   Title The patient will negotiate 4 steps x 3 reps with one handrail and reciprocal pattern modified indep. 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    Time 4   Period Weeks   Status New   PT SHORT TERM GOAL #7   Title pt will ambulate 10 minutes (as pt is currently picking up his children from school which requires a 10 minute walk) on outodoor, unlevel surfaces using SPC and demonstrate no LOB. July 14th.   Time 4   Period Weeks   Status New   PT SHORT TERM GOAL #8   Title pt will demonstrate improved functional balance by being able to maintain single limb stance >6 seconds bilaterally to be able to step over community obstacles.    Time --   Period --   Status --           PT Long Term Goals - 10/13/14 1440    PT LONG TERM GOAL #2   Title The patient will improve stroke impact scale mobility by 15% (baseline 64%) to demo improved self perception of mobility.  Target date 10/17/2014   Time 8   Period Weeks   Status On-going   PT LONG TERM GOAL #6   Title The patient will negotiate level and unlevel community surfaces with least restrictive assistive device x 1200 ft modified indep.  Target date 10/17/2014   Time 8   Period Weeks   Status On-going           Plan - 10/18/14 0938    Clinical Impression Statement Pt challenged with todays activities, needing UE support or assist for balance. Progressing toward goals.   Pt will benefit from skilled therapeutic intervention in order to improve on the following deficits Abnormal gait;Difficulty walking;Postural dysfunction;Decreased balance;Decreased activity tolerance;Decreased mobility   Rehab Potential Good   PT Frequency 2x / week   PT Duration 8 weeks   PT Treatment/Interventions Therapeutic activities;Patient/family education;Therapeutic exercise;Manual techniques;Balance training;Gait training;Neuromuscular  re-education;Stair training;Functional mobility training   PT Next Visit Plan balance control with head turns/eyes closed; gait training on uneven surfaces; weightbearing and weightshifting to left    Consulted and Agree with Plan of Care Patient        Problem List Patient Active Problem List   Diagnosis Date Noted  . Abnormal LFTs 08/12/2014  . Ataxia   . CVA (cerebral infarction) 08/03/2014  . ARF (acute renal failure)   . Insulin dependent diabetes mellitus   . Cerebral thrombosis with cerebral infarction   07/28/2014  . Intractable nausea and vomiting 07/27/2014  . Vertigo 07/27/2014  . Essential hypertension 07/27/2014  . Type 1 diabetes mellitus 05/29/2010  . Hyperlipidemia 05/29/2010  . Benign essential HTN 05/29/2010  . PLANTAR FASCIITIS 05/29/2010    Willow Ora 10/19/2014, 1:16 PM  Willow Ora, PTA, [place] [facility] [address], [place], [place] [address] [phone] 10/19/2014, 1:16 PM

## 2014-10-20 ENCOUNTER — Ambulatory Visit: Payer: 59 | Admitting: Rehabilitative and Restorative Service Providers"

## 2014-10-20 DIAGNOSIS — Z7409 Other reduced mobility: Secondary | ICD-10-CM | POA: Diagnosis not present

## 2014-10-20 DIAGNOSIS — R269 Unspecified abnormalities of gait and mobility: Secondary | ICD-10-CM

## 2014-10-20 NOTE — Therapy (Signed)
[facility] [address] [place], [place], [address] Phone: [phone]   Fax:  [phone]  Physical Therapy Treatment  Patient Details  Name: Adrian Neal MRN: [id number] Date of Birth: [birth year] Referring Provider:  Anda Kraft, MD  Encounter Date: 10/20/2014      PT End of Session - 10/20/14 1419    Visit Number 19   Number of Visits 24   Date for PT Re-Evaluation 10/17/14   PT Start Time 1028   PT Stop Time 1110   PT Time Calculation (min) 42 min   Equipment Utilized During Treatment Gait belt   Activity Tolerance Patient tolerated treatment well   Behavior During Therapy [name]      Past Medical History  Diagnosis Date  . Hypertension   . Diabetes mellitus without complication     diagnosed at age 36  . 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 Eye surgery  1990    for retinopathy   . Cataract extraction w/ intraocular lens implant  1994    There were no vitals filed for this visit.  Visit Diagnosis:  Abnormality of gait      Subjective Assessment - 10/20/14 1030    Subjective The patient reports noticing guarding with his UEs during gait.   Currently in Pain? No/denies      NEUROMUSCULAR RE-EDUCATION: L leg stance activities rolling a ball Rhythmic stabilization L leg stance for postural control Alternating LE foot taps Sidestepping taps for L weight shifting Quadriped UE/LE contralateral reaching and return to crunch (elbow to knee) x 10 reps each side Quadriped alternating LEs tapping out to the side into hip abduction Lunging working on single limb stance control with intermittent UE support Standing trunk rotation with UE reaching  Switching legs in hopping motion for speed/agility/coordination  Gait: Ambulation emphasizing speed, arm swing, mini squats, toe walking Treadmill  walking with UE support 6% incline up to 2.5 mph with close supervision to CGA for safety x 5.5 minutes  THERAPEUTIC EXERCISE: Ankle raises L leg stance x 20 reps Plyometric hopping on L LE in parallel bars for support Hopping anteriorly/posteriorly in parallel bars Wide leg hopping and quick marches for speed/coordination/motor control          PT Short Term Goals - 10/13/14 1043    PT SHORT TERM GOAL #1   Title The patient will be indep with HEP for balance, and mobility.  Target date 09/16/2014   Time 4   Period Weeks   Status Achieved   PT SHORT TERM GOAL #2   Title The patient will improve Berg score from 30/56 up to 36/56 to demo decreased risk for falls.  Target date 09/16/2014   Baseline pt's [name] is currently 44/56   Time 4   Period Weeks   Status Achieved   PT SHORT TERM GOAL #3   Title The patient will ambulate household distances/level surfaces (300 ft) without a device independently.  Target date 09/16/2014   Baseline partially met, due to SBA for safety with transitioning with [name]   Time 4   Period Weeks   Status Partially Met   PT SHORT TERM GOAL #4   Title The patient will increase walking speed from 1.2 ft/sec up to 1.8 ft/sec to demo decreased risk for falls (with least restrictive device).  Target date 09/16/2014   Baseline pt currently  2.68 ft/second with SPC   Time 4   Period Weeks   Status Achieved   PT SHORT TERM GOAL #5   Title The patient will negotiate 4 steps x 3 reps with one handrail and reciprocal pattern modified indep. 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    Time 4   Period  Weeks   Status New   PT SHORT TERM GOAL #7   Title pt will ambulate 10 minutes (as pt is currently picking up his children from school which requires a 10 minute walk) on outodoor, unlevel surfaces using SPC and demonstrate no LOB. July 14th.   Time 4   Period Weeks   Status New   PT SHORT TERM GOAL #8   Title pt will demonstrate improved functional balance by being able to maintain single limb stance >6 seconds bilaterally to be able to step over community obstacles.    Time --   Period --   Status --           PT Long Term Goals - 10/20/14 1421    PT LONG TERM GOAL #2   Title The patient will improve stroke impact scale mobility by 15% (baseline 64%) to demo improved self perception of mobility.  Target date 11/26/2014.   Time 8   Period Weeks   Status On-going   PT LONG TERM GOAL #6   Title The patient will negotiate level and unlevel community surfaces with least restrictive assistive device x 1200 ft modified indep.  Target date 11/26/2014   Time 8   Period Weeks   Status On-going               Plan - 10/20/14 1422    Clinical Impression Statement Patient continues to progress mobility.  He is challenged by single limb stance, plyometric activities and dynamic postural control.   PT Next Visit Plan balance control with head turns/eyes closed; gait training on uneven surfaces; weightbearing and weightshifting to left    Consulted and Agree with Plan of Care Patient        Problem List Patient Active Problem List   Diagnosis Date Noted  . Abnormal LFTs 08/12/2014  . Ataxia   . CVA (cerebral infarction) 08/03/2014  . ARF (acute renal failure)   . Insulin dependent diabetes mellitus   . 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 Intractable nausea and vomiting 07/27/2014  . Vertigo 07/27/2014  . Essential hypertension 07/27/2014  . Type 1 diabetes mellitus 05/29/2010  . Hyperlipidemia 05/29/2010  . Benign essential HTN 05/29/2010  .  PLANTAR FASCIITIS 05/29/2010    [name], PT 10/20/2014, 2:27 PM  [name] [address] [name], [place], [address] Phone: [phone]   Fax:  [phone]

## 2014-10-24 ENCOUNTER — Ambulatory Visit: Payer: 59 | Admitting: Occupational Therapy

## 2014-10-24 DIAGNOSIS — Z7409 Other reduced mobility: Secondary | ICD-10-CM | POA: Diagnosis not present

## 2014-10-24 DIAGNOSIS — R531 Weakness: Secondary | ICD-10-CM

## 2014-10-24 DIAGNOSIS — R6889 Other general symptoms and signs: Secondary | ICD-10-CM

## 2014-10-24 NOTE — Therapy (Signed)
[facility] [address] [place], [place], [address] Phone: [phone]   Fax:  [phone]  Occupational Therapy Treatment  Patient Details  Name: Adrian Neal MRN: [id number] Date of Birth: [birth year] Referring Provider:  Anda Kraft, MD  Encounter Date: 10/24/2014      OT End of Session - 10/24/14 1215    Visit Number 14   Number of Visits 17   Date for OT Re-Evaluation 11/04/14   Authorization Type [employer]   Authorization Time Period week 7/8   OT Start Time 1020   OT Stop Time 1105   OT Time Calculation (min) 45 min   Equipment Utilized During Treatment GAIT BELT   Activity Tolerance Patient tolerated treatment well      Past Medical History  Diagnosis Date  . Hypertension   . Diabetes mellitus without complication     diagnosed at age 21  . 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 Eye surgery  1990    for retinopathy   . Cataract extraction w/ intraocular lens implant  1994    There were no vitals filed for this visit.  Visit Diagnosis:  Decreased strength, endurance, and mobility      Subjective Assessment - 10/24/14 1022    Subjective  I was dizzy yesterday and my balance was off, but no changes in meds and BP was normal. I feel fine today   Patient Stated Goals Get my hand to start working better, improve endurance   Currently in Pain? No/denies                      OT Treatments/Exercises (OP) - 10/24/14 0001    ADLs   Functional Mobility Working towards LTG #2: dynamic standing reaching outside BOS bilateral sides, stepping forwards and backwards bilaterally, and down to floor with self corrected minimal LOB x2 for 20 min. w/o rest  (for balance, standing tolerance, strength/endurance)   ADL Comments Discussion re: progress to date and plan of care. Pt/therapist agreed that O.T. could wrap up next  week based on pt's needs/goals and P.T. can continue to focus on balance. Pt reports he will not be returning to previous line of work within the next year.    Shoulder Exercises: ROM/Strengthening   UBE (Upper Arm Bike) x 5 min. Level 10 for UE strength/endurance                  OT Short Term Goals - 09/09/14 0949    OT SHORT TERM GOAL #1   Title Independent w/ HEP for coordination (due 09/15/14)   Time 4   Period Weeks   Status Achieved   OT SHORT TERM GOAL #2   Title Pt to simulate tub transfer with tub transfer bench safely and verbalize understanding of acquistion of DME   Time 4   Period Weeks   Status Deferred  Pt does not wish to pursue purchasing DME at this time   OT SHORT TERM GOAL #3   Title Pt to demo sufficient strength and endurance LUE to retrieve/replace 5 lb. object from overhead shelf x 10 reps without rest   Time 4   Period Weeks   Status Achieved  as of 09/06/14   OT SHORT TERM GOAL #4   Title Pt to perform complex money exchange with 90% or higher accuracy in prep for work related  tasks   Time 4   Period Weeks   Status Achieved  as of 09/06/14   OT SHORT TERM GOAL #5   Title Improve LUE functional use as evidenced by performing 42 or greater blocks on Box & Blocks test   Baseline met 09/09/14-49 blocks   Time 4   Period Weeks   Status Achieved           OT Long Term Goals - 10/12/14 1207    OT LONG TERM GOAL #1   Title Improve coordination as evidenced by performing 9 hole peg test in 25 sec. or under (due 10/15/14)   Baseline 30.81 sec.    Time 8   Period Weeks   Status Achieved  24.66 sec. on 10/10/14   OT LONG TERM GOAL #2   Title Pt to perform dynamic standing tasks for light cleaning for 15 minutes or greater w/o rest or LOB    Time 8   Period Weeks   Status On-going   OT LONG TERM GOAL #3   Title Pt to perform divided attention tasks between physical and cognitive tasks with 90% or greater accuracy in prep for work related tasks    Time 8   Period Weeks   Status On-going               Plan - 10/24/14 1216    Clinical Impression Statement Pt making progress towards remaining LTG's.    Plan continue functional mobility, strength/endurance, divided attn. Anticipate d/c end of next week   OT Home Exercise Plan coordination HEP issued 08/23/14, Theraband HEP LUE 09/19/14   Consulted and Agree with Plan of Care Patient        Problem List Patient Active Problem List   Diagnosis Date Noted  . Abnormal LFTs 08/12/2014  . Ataxia   . CVA (cerebral infarction) 08/03/2014  . ARF (acute renal failure)   . Insulin dependent diabetes mellitus   . 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 Intractable nausea and vomiting 07/27/2014  . Vertigo 07/27/2014  . Essential hypertension 07/27/2014  . Type 1 diabetes mellitus 05/29/2010  . Hyperlipidemia 05/29/2010  . Benign essential HTN 05/29/2010  . PLANTAR FASCIITIS 05/29/2010    Carey Bullocks, OTR/L 10/24/2014, 12:19 PM  [facility] [address] [place], [place], [address] Phone: [phone]   Fax:  [phone]

## 2014-10-25 ENCOUNTER — Encounter: Payer: Self-pay | Admitting: Physical Therapy

## 2014-10-25 ENCOUNTER — Ambulatory Visit: Payer: 59 | Admitting: Physical Therapy

## 2014-10-25 DIAGNOSIS — Z7409 Other reduced mobility: Secondary | ICD-10-CM | POA: Diagnosis not present

## 2014-10-25 DIAGNOSIS — R269 Unspecified abnormalities of gait and mobility: Secondary | ICD-10-CM

## 2014-10-25 DIAGNOSIS — H814 Vertigo of central origin: Secondary | ICD-10-CM

## 2014-10-25 DIAGNOSIS — R6889 Other general symptoms and signs: Secondary | ICD-10-CM

## 2014-10-25 DIAGNOSIS — I69319 Unspecified symptoms and signs involving cognitive functions following cerebral infarction: Secondary | ICD-10-CM

## 2014-10-25 DIAGNOSIS — R531 Weakness: Secondary | ICD-10-CM

## 2014-10-25 DIAGNOSIS — IMO0002 Reserved for concepts with insufficient information to code with codable children: Secondary | ICD-10-CM

## 2014-10-26 ENCOUNTER — Encounter: Payer: Self-pay | Admitting: Physical Therapy

## 2014-10-26 ENCOUNTER — Encounter: Payer: Self-pay | Admitting: Occupational Therapy

## 2014-10-26 ENCOUNTER — Ambulatory Visit: Payer: 59 | Admitting: Occupational Therapy

## 2014-10-26 ENCOUNTER — Ambulatory Visit: Payer: 59 | Admitting: Physical Therapy

## 2014-10-26 DIAGNOSIS — R269 Unspecified abnormalities of gait and mobility: Secondary | ICD-10-CM

## 2014-10-26 DIAGNOSIS — I69319 Unspecified symptoms and signs involving cognitive functions following cerebral infarction: Secondary | ICD-10-CM

## 2014-10-26 DIAGNOSIS — R6889 Other general symptoms and signs: Secondary | ICD-10-CM

## 2014-10-26 DIAGNOSIS — Z7409 Other reduced mobility: Principal | ICD-10-CM

## 2014-10-26 DIAGNOSIS — R531 Weakness: Secondary | ICD-10-CM

## 2014-10-26 DIAGNOSIS — IMO0002 Reserved for concepts with insufficient information to code with codable children: Secondary | ICD-10-CM

## 2014-10-26 DIAGNOSIS — H814 Vertigo of central origin: Secondary | ICD-10-CM

## 2014-10-26 NOTE — Therapy (Signed)
[facility] [address] [place], [place], [address] Phone: [phone]   Fax:  [phone]  Occupational Therapy Treatment  Patient Details  Name: Adrian Neal MRN: [id number] Date of Birth: November 29, 1965 Referring Provider:  Anda Kraft, MD  Encounter Date: 10/26/2014      OT End of Session - 10/26/14 1121    Visit Number 15   Number of Visits 17   Date for OT Re-Evaluation 11/04/14   Authorization Type UHC   Authorization Time Period week 7/8   OT Start Time 1020   OT Stop Time 1110   OT Time Calculation (min) 50 min   Equipment Utilized During Treatment GAIT BELT   Activity Tolerance Patient tolerated treatment well      Past Medical History  Diagnosis Date  . Hypertension   . Diabetes mellitus without complication     diagnosed at age 49  . 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 Eye surgery  1990    for retinopathy   . Cataract extraction w/ intraocular lens implant  1994    There were no vitals filed for this visit.  Visit Diagnosis:  Decreased strength, endurance, and mobility  Lack of coordination due to stroke  Cognitive deficits following cerebral infarction      Subjective Assessment - 10/26/14 1033    Subjective  Im doing good   Patient Stated Goals Get my hand to start working better, improve endurance   Currently in Pain? No/denies       TREATMENT:  Dynamic standing while performing reaching activity, reaching outside BOS in diagonal patterns bilaterally for wt. shifts and transitional movements with min LOB x2.  Picking up 25 # box and moving to waist height on Rt and Lt side x 5 each side for work related tasks Ambulating while dribbling large ball predominantly with Lt hand, then tossing medium sized ball in air and catching with both hands, then catching small ball in Lt hand, Rt hand, and both  hands simultaneously with moderate difficulty and min to mod drops during last gross motor coordination task. Minimal self corrected LOB without cane. Pt performed w/o rest Then after rest break, pt stood in place while performing above task with 2 smaller balls while simultaneously performing category generation for divided attention w/ min difficulty  UBE x 10 min. Level 10 resistance for BUE strength/endurance.                          OT Short Term Goals - 09/09/14 0949    OT SHORT TERM GOAL #1   Title Independent w/ HEP for coordination (due 09/15/14)   Time 4   Period Weeks   Status Achieved   OT SHORT TERM GOAL #2   Title Pt to simulate tub transfer with tub transfer bench safely and verbalize understanding of acquistion of DME   Time 4   Period Weeks   Status Deferred  Pt does not wish to pursue purchasing DME at this time   OT SHORT TERM GOAL #3   Title Pt to demo sufficient strength and endurance LUE to retrieve/replace 5 lb. object from overhead shelf x 10 reps without rest   Time 4   Period Weeks   Status Achieved  as of 09/06/14   OT SHORT TERM GOAL #4   Title Pt to perform complex money  exchange with 90% or higher accuracy in prep for work related tasks   Time 4   Period Weeks   Status Achieved  as of 09/06/14   OT SHORT TERM GOAL #5   Title Improve LUE functional use as evidenced by performing 42 or greater blocks on Box & Blocks test   Baseline met 09/09/14-49 blocks   Time 4   Period Weeks   Status Achieved           OT Long Term Goals - 10/12/14 1207    OT LONG TERM GOAL #1   Title Improve coordination as evidenced by performing 9 hole peg test in 25 sec. or under (due 10/15/14)   Baseline 30.81 sec.    Time 8   Period Weeks   Status Achieved  24.66 sec. on 10/10/14   OT LONG TERM GOAL #2   Title Pt to perform dynamic standing tasks for light cleaning for 15 minutes or greater w/o rest or LOB    Time 8   Period Weeks   Status  On-going   OT LONG TERM GOAL #3   Title Pt to perform divided attention tasks between physical and cognitive tasks with 90% or greater accuracy in prep for work related tasks   Time 8   Period Weeks   Status On-going               Plan - 10/26/14 1122    Clinical Impression Statement Pt continues to make progress towards remaining LTG's. Pt with increased functional balance and endurance today   Plan continue functional mobility, strength/endurance, divided attention, anticipate d/c end of next week        Problem List Patient Active Problem List   Diagnosis Date Noted  . Abnormal LFTs 08/12/2014  . Ataxia   . CVA (cerebral infarction) 08/03/2014  . ARF (acute renal failure)   . Insulin dependent diabetes mellitus   . 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 Intractable nausea and vomiting 07/27/2014  . Vertigo 07/27/2014  . Essential hypertension 07/27/2014  . Type 1 diabetes mellitus 05/29/2010  . Hyperlipidemia 05/29/2010  . Benign essential HTN 05/29/2010  . PLANTAR FASCIITIS 05/29/2010    Carey Bullocks, OTR/L 10/26/2014, 11:24 AM  [facility] [address] [place], [place], [address] Phone: [phone]   Fax:  [phone]

## 2014-10-26 NOTE — Therapy (Signed)
[facility] [address] [place], [place], [address] Phone: [phone]   Fax:  [phone]  Physical Therapy Treatment  Patient Details  Name: Adrian Neal MRN: [id number] Date of Birth: 12-Jun-1965 Referring Provider:  Anda Kraft, MD  Encounter Date: 10/26/2014      PT End of Session - 10/26/14 1303    Visit Number 21   Number of Visits 24   Date for PT Re-Evaluation 11/10/14   PT Start Time 1146   PT Stop Time 1229   PT Time Calculation (min) 43 min   Equipment Utilized During Treatment Gait belt   Activity Tolerance Patient tolerated treatment well   Behavior During Therapy [facility] for tasks assessed/performed      Past Medical History  Diagnosis Date  . Hypertension   . Diabetes mellitus without complication     diagnosed at age 22  . 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 Eye surgery  1990    for retinopathy   . Cataract extraction w/ intraocular lens implant  1994    There were no vitals filed for this visit.  Visit Diagnosis:  Decreased strength, endurance, and mobility  Lack of coordination due to stroke  Cognitive deficits following cerebral infarction  Abnormality of gait  Vertigo, central origin, unspecified laterality      Subjective Assessment - 10/26/14 1305    Subjective No new complaints. No falls since last session. Denies pain.   Currently in Pain? No/denies             [facility] Adult PT Treatment/Exercise - 10/26/14 1309    Ambulation/Gait   Ambulation/Gait Yes   Ambulation/Gait Assistance 4: Min guard   Ambulation/Gait Assistance Details occasional stagger noted, especially on uneven surfaces   Ambulation Distance (Feet) 750 Feet   Assistive device None   Gait Pattern Decreased step length - right;Decreased step length - left   Ambulation Surface  Level;Unlevel;Indoor;Outdoor;Paved;Gravel;Grass      Neuro Re-Ed (min guard for safety, occasional min assist needed to correct LOB; cues for posture, sequencing, and ex form): -On red mat: Stepping over hurdles forwards and side stepping x2 laps each ex Kicking over cones and bringing back up x2 laps each foot Figure 8 through cones with alt tapping each cone x3 laps  -In parallel bars on foam beam: Tandem walking forwards and backwards x3 laps Side stepping x3 laps Tandem stance with eyes closed - head turns side to side, up and down, diagonal both directions x10 each exercise  -SLS on L while sliding beanbag across floor with R foot 1x5, 2x10           PT Short Term Goals - 10/26/14 1306    PT SHORT TERM GOAL #1   Title The patient will be indep with HEP for balance, and mobility.  Target date 09/16/2014   Time 4   Period Weeks   Status Achieved   PT SHORT TERM GOAL #2   Title The patient will improve Berg score from 30/56 up to 36/56 to demo decreased risk for falls.  Target date 09/16/2014   Baseline pt's [name] is currently 44/56   Time 4   Period Weeks   Status Achieved   PT SHORT TERM GOAL #3   Title The patient will ambulate household distances/level surfaces (300 ft) without a device independently.  Target date 09/16/2014   Baseline partially met,  due to SBA for safety with transitioning with SPC   Time 4   Period Weeks   Status Partially Met   PT SHORT TERM GOAL #4   Title The patient will increase walking speed from 1.2 ft/sec up to 1.8 ft/sec to demo decreased risk for falls (with least restrictive device).  Target date 09/16/2014   Baseline pt currently 2.68 ft/second with SPC   Time 4   Period Weeks   Status Achieved   PT SHORT TERM GOAL #5   Title The patient will negotiate 4 steps x 3 reps with one handrail and reciprocal pattern modified indep. Target date 09/16/2014   Baseline supervision currently required for safety as pt reports he is still cautious  with desending steps    Time 4   Period Weeks   Status Partially Met   PT SHORT TERM GOAL #6   Title pt will return understanding of resources available to help aid his transition back to work [employer]) Target date July 14th.    Time 4   Period Weeks   Status New   PT SHORT TERM GOAL #7   Title pt will ambulate 10 minutes (as pt is currently picking up his children from school which requires a 10 minute walk) on outodoor, unlevel surfaces using SPC and demonstrate no LOB. July 14th.   Time 4   Period Weeks   Status New   PT SHORT TERM GOAL #8   Title pt will demonstrate improved functional balance by being able to maintain single limb stance >6 seconds bilaterally to be able to step over community obstacles.            PT Long Term Goals - 10/26/14 1307    PT LONG TERM GOAL #2   Title The patient will improve stroke impact scale mobility by 15% (baseline 64%) to demo improved self perception of mobility.  Target date 11/26/2014.   Time 8   Period Weeks   Status On-going   PT LONG TERM GOAL #6   Title The patient will negotiate level and unlevel community surfaces with least restrictive assistive device x 1200 ft modified indep.  Target date 11/26/2014   Time 8   Period Weeks   Status On-going           Plan - 10/26/14 1305    Clinical Impression Statement Continuing to improve mobility, however still mild unsteadiness and R sway with gait. Continues to be challenged by dynamic balance on compliant surface with occasional LOB requiring assistance to correct. Occasionally demonstrated ability to self-correct slight sway or misstep during balance activities. Continuing to progress toward goals.   Pt will benefit from skilled therapeutic intervention in order to improve on the following deficits Abnormal gait;Difficulty walking;Postural dysfunction;Decreased balance;Decreased activity tolerance;Decreased mobility   Rehab Potential Good   PT Frequency 2x / week   PT  Duration 8 weeks   PT Treatment/Interventions Therapeutic activities;Patient/family education;Therapeutic exercise;Manual techniques;Balance training;Gait training;Neuromuscular re-education;Stair training;Functional mobility training   PT Next Visit Plan balance control with head turns/eyes closed; balance on compliant surfaces; gait training on uneven surfaces; weightbearing and weightshifting to left    Consulted and Agree with Plan of Care Patient        Problem List Patient Active Problem List   Diagnosis Date Noted  . Abnormal LFTs 08/12/2014  . Ataxia   . CVA (cerebral infarction) 08/03/2014  . ARF (acute renal failure)   . Insulin dependent diabetes mellitus   . Cerebral thrombosis  with cerebral infarction 07/28/2014  . Intractable nausea and vomiting 07/27/2014  . Vertigo 07/27/2014  . Essential hypertension 07/27/2014  . Type 1 diabetes mellitus 05/29/2010  . Hyperlipidemia 05/29/2010  . Benign essential HTN 05/29/2010  . PLANTAR FASCIITIS 05/29/2010   [place], [place], [place] 10/26/2014, 1:31 PM

## 2014-10-26 NOTE — Therapy (Signed)
[facility] [address] [place], [place], [address] Phone: [phone]   Fax:  [phone]  Physical Therapy Treatment  Patient Details  Name: Adrian Neal MRN: [id number] Date of Birth: 1966-01-22 Referring Provider:  Anda Kraft, MD  Encounter Date: 10/25/2014      PT End of Session - 10/25/14 1620    Visit Number 20   Number of Visits 24   Date for PT Re-Evaluation 11/10/14   PT Start Time 4585   PT Stop Time 1613   PT Time Calculation (min) 39 min   Equipment Utilized During Treatment Gait belt   Activity Tolerance Patient tolerated treatment well   Behavior During Therapy [facility] for tasks assessed/performed      Past Medical History  Diagnosis Date  . Hypertension   . Diabetes mellitus without complication     diagnosed at age 35  . 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 Eye surgery  1990    for retinopathy   . Cataract extraction w/ intraocular lens implant  1994    There were no vitals filed for this visit.  Visit Diagnosis:  Decreased strength, endurance, and mobility  Abnormality of gait  Lack of coordination due to stroke  Cognitive deficits following cerebral infarction  Vertigo, central origin, unspecified laterality      Subjective Assessment - 10/25/14 1622    Subjective No new complaints. No falls since last session. Denies pain.   Currently in Pain? No/denies           [facility] Adult PT Treatment/Exercise - 10/25/14 1625    Ambulation/Gait   Ambulation/Gait Yes   Ambulation/Gait Assistance 4: Min guard   Ambulation/Gait Assistance Details mild unsteadiness with occasional R stagger on uneven surfaces possibly due to LE fatigue after walking on treadmill   Ambulation Distance (Feet) 500 Feet   Assistive device None   Gait Pattern Decreased step length - right;Decreased step length - left    Ambulation Surface Level;Unlevel;Outdoor;Paved;Gravel;Grass     Exercises: -Treadmill: 6.5 minutes; worked up to 6% incline and 2.5 mph; UE support and supervision needed -Balance: -On ramp (min guard and cues for ex form): alt step forward eyes open, eyes closed; alt step backward eyes open, eyes closed; head turns in tandem stance with eyes closed, side to side, up and down (x10 each exercise) -On red mat (min guard and cues for ex form, sequencing, and to slow down):  cone toe taps with side step x2 laps hurdles walking forwards and side stepping (x2 laps each; LOBx2 required mod assist to correct)          PT Short Term Goals - 10/13/14 1043    PT SHORT TERM GOAL #1   Title The patient will be indep with HEP for balance, and mobility.  Target date 09/16/2014   Time 4   Period Weeks   Status Achieved   PT SHORT TERM GOAL #2   Title The patient will improve Berg score from 30/56 up to 36/56 to demo decreased risk for falls.  Target date 09/16/2014   Baseline pt's [name] is currently 44/56   Time 4   Period Weeks   Status Achieved   PT SHORT TERM GOAL #3   Title The patient will ambulate household distances/level surfaces (300 ft) without a device independently.  Target date 09/16/2014   Baseline partially met, due to SBA for  safety with transitioning with SPC   Time 4   Period Weeks   Status Partially Met   PT SHORT TERM GOAL #4   Title The patient will increase walking speed from 1.2 ft/sec up to 1.8 ft/sec to demo decreased risk for falls (with least restrictive device).  Target date 09/16/2014   Baseline pt currently 2.68 ft/second with SPC   Time 4   Period Weeks   Status Achieved   PT SHORT TERM GOAL #5   Title The patient will negotiate 4 steps x 3 reps with one handrail and reciprocal pattern modified indep. 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    Time 4   Period Weeks   Status New   PT SHORT TERM GOAL #7   Title pt will ambulate 10 minutes (as pt is currently picking up his children from school which requires a 10 minute walk) on outodoor, unlevel surfaces using SPC and demonstrate no LOB. July 14th.   Time 4   Period Weeks   Status New   PT SHORT TERM GOAL #8   Title pt will demonstrate improved functional balance by being able to maintain single limb stance >6 seconds bilaterally to be able to step over community obstacles.    Time --   Period --   Status --           PT Long Term Goals - 10/20/14 1421    PT LONG TERM GOAL #2   Title The patient will improve stroke impact scale mobility by 15% (baseline 64%) to demo improved self perception of mobility.  Target date 11/26/2014.   Time 8   Period Weeks   Status On-going   PT LONG TERM GOAL #6   Title The patient will negotiate level and unlevel community surfaces with least restrictive assistive device x 1200 ft modified indep.  Target date 11/26/2014   Time 8   Period Weeks   Status On-going            Plan - 10/25/14 0820    Clinical Impression Statement Patient continuing to increase endurance, however still presents with R stagger during gait that appears to worsen with fatigue, despite pt reporting he feels he is being pushed to the left. Pt may be overcorrecting this feeling causing the right side stagger/inbalance. He is challenged by dynamic balance on compliant surface with occasional LOB requiring assistance to correct. Continuing to progress toward goals.   Pt will benefit from skilled therapeutic intervention in order to improve on the following deficits Abnormal gait;Difficulty walking;Postural  dysfunction;Decreased balance;Decreased activity tolerance;Decreased mobility   Rehab Potential Good   PT Frequency 2x / week   PT Duration 8 weeks   PT Treatment/Interventions Therapeutic activities;Patient/family education;Therapeutic exercise;Manual techniques;Balance training;Gait training;Neuromuscular re-education;Stair training;Functional mobility training   PT Next Visit Plan balance control with head turns/eyes closed; balance on compliant surfaces; gait training on uneven surfaces; weightbearing and weightshifting to left    Consulted and Agree with Plan of Care Patient        Problem List Patient Active Problem List  Diagnosis Date Noted  . Abnormal LFTs 08/12/2014  . Ataxia   . CVA (cerebral infarction) 08/03/2014  . ARF (acute renal failure)   . Insulin dependent diabetes mellitus   . 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 Intractable nausea and vomiting 07/27/2014  . Vertigo 07/27/2014  . Essential hypertension 07/27/2014  . Type 1 diabetes mellitus 05/29/2010  . Hyperlipidemia 05/29/2010  . Benign essential HTN 05/29/2010  . PLANTAR FASCIITIS 05/29/2010    Rubye Oaks 10/26/2014, 8:29 AM  Rubye Oaks, SPTA  [place], [place], [facility] [address] [place], [place] [address] [phone] 10/26/2014, 12:27 PM

## 2014-11-01 ENCOUNTER — Ambulatory Visit: Payer: 59 | Attending: Physical Medicine & Rehabilitation | Admitting: Occupational Therapy

## 2014-11-01 ENCOUNTER — Encounter: Payer: Self-pay | Admitting: Occupational Therapy

## 2014-11-01 DIAGNOSIS — R269 Unspecified abnormalities of gait and mobility: Secondary | ICD-10-CM | POA: Diagnosis present

## 2014-11-01 DIAGNOSIS — H8149 Vertigo of central origin, unspecified ear: Secondary | ICD-10-CM | POA: Diagnosis present

## 2014-11-01 DIAGNOSIS — R279 Unspecified lack of coordination: Secondary | ICD-10-CM | POA: Insufficient documentation

## 2014-11-01 DIAGNOSIS — I6980 Unspecified sequelae of other cerebrovascular disease: Secondary | ICD-10-CM | POA: Insufficient documentation

## 2014-11-01 DIAGNOSIS — I6931 Cognitive deficits following cerebral infarction: Secondary | ICD-10-CM | POA: Diagnosis present

## 2014-11-01 DIAGNOSIS — Z7409 Other reduced mobility: Secondary | ICD-10-CM | POA: Diagnosis present

## 2014-11-01 DIAGNOSIS — R531 Weakness: Secondary | ICD-10-CM

## 2014-11-01 DIAGNOSIS — R6889 Other general symptoms and signs: Secondary | ICD-10-CM

## 2014-11-01 NOTE — Therapy (Signed)
[facility] [address] [place], [place], [address] Phone: [phone]   Fax:  [phone]  Occupational Therapy Treatment  Patient Details  Name: Adrian Neal MRN: [id number] Date of Birth: 11-05-1965 Referring Provider:  Anda Kraft, MD  Encounter Date: 11/01/2014      OT End of Session - 11/01/14 1246    Visit Number 16   Number of Visits 17   Date for OT Re-Evaluation 11/04/14   Authorization Type U[employer]   Authorization Time Period week 8/8   OT Start Time 1105   OT Stop Time 1145   OT Time Calculation (min) 40 min   Equipment Utilized During Treatment GAIT BELT   Activity Tolerance Patient tolerated treatment well      Past Medical History  Diagnosis Date  . Hypertension   . Diabetes mellitus without complication     diagnosed at age 49  . 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 Eye surgery  1990    for retinopathy   . Cataract extraction w/ intraocular lens implant  1994    There were no vitals filed for this visit.  Visit Diagnosis:  Decreased strength, endurance, and mobility      Subjective Assessment - 11/01/14 1107    Patient Stated Goals Get my hand to start working better, improve endurance   Currently in Pain? No/denies        TREATMENT:  Pt side stepping, forward stepping, backwards stepping bilaterally for functional reaching and dynamic standing balance w/o LOB. Pt then ambulating while tossing ball Rt/Lt hand alternating then simultaneously with min to mod drops. Pt ambulating while holding plate in one hand and tossing ball in the other (then switching hands) with min drops and LOB x 1.   UBE x 5 min. Level 10                         OT Short Term Goals - 09/09/14 0949    OT SHORT TERM GOAL #1   Title Independent w/ HEP for coordination (due 09/15/14)   Time 4   Period Weeks   Status Achieved   OT SHORT TERM GOAL #2   Title Pt to simulate tub transfer with tub transfer bench safely and verbalize understanding of acquistion of DME   Time 4   Period Weeks   Status Deferred  Pt does not wish to pursue purchasing DME at this time   OT SHORT TERM GOAL #3   Title Pt to demo sufficient strength and endurance LUE to retrieve/replace 5 lb. object from overhead shelf x 10 reps without rest   Time 4   Period Weeks   Status Achieved  as of 09/06/14   OT SHORT TERM GOAL #4   Title Pt to perform complex money exchange with 90% or higher accuracy in prep for work related tasks   Time 4   Period Weeks   Status Achieved  as of 09/06/14   OT SHORT TERM GOAL #5   Title Improve LUE functional use as evidenced by performing 42 or greater blocks on Box & Blocks test   Baseline met 09/09/14-49 blocks   Time 4   Period Weeks   Status Achieved           OT Long Term Goals - 11/01/14 1247    OT LONG TERM GOAL #1  Title Improve coordination as evidenced by performing 9 hole peg test in 25 sec. or under (due 10/15/14)   Baseline 30.81 sec.    Time 8   Period Weeks   Status Achieved  24.66 sec. on 10/10/14   OT LONG TERM GOAL #2   Title Pt to perform dynamic standing tasks for light cleaning for 15 minutes or greater w/o rest or LOB    Time 8   Period Weeks   Status On-going   OT LONG TERM GOAL #3   Title Pt to perform divided attention tasks between physical and cognitive tasks with 90% or greater accuracy in prep for work related tasks   Time 8   Period Weeks   Status Achieved               Plan - 11/01/14 1247    Clinical Impression Statement Pt met LTG #3 and approximating LTG #2. Pt ready for D/C next session   Plan D/C O.T. next session, assess remaining goal   OT Home Exercise Plan coordination HEP issued 08/23/14, Theraband HEP LUE 09/19/14        Problem List Patient Active Problem List   Diagnosis Date Noted  . Abnormal LFTs 08/12/2014  .  Ataxia   . CVA (cerebral infarction) 08/03/2014  . ARF (acute renal failure)   . Insulin dependent diabetes mellitus   . 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 Intractable nausea and vomiting 07/27/2014  . Vertigo 07/27/2014  . Essential hypertension 07/27/2014  . Type 1 diabetes mellitus 05/29/2010  . Hyperlipidemia 05/29/2010  . Benign essential HTN 05/29/2010  . PLANTAR FASCIITIS 05/29/2010    Carey Bullocks, OTR/L 11/01/2014, 12:49 PM  [facility] [address] [place], [place], [address] Phone: [phone]   Fax:  [phone]

## 2014-11-03 ENCOUNTER — Ambulatory Visit: Payer: 59 | Admitting: Occupational Therapy

## 2014-11-03 DIAGNOSIS — Z7409 Other reduced mobility: Secondary | ICD-10-CM | POA: Diagnosis not present

## 2014-11-03 DIAGNOSIS — R6889 Other general symptoms and signs: Secondary | ICD-10-CM

## 2014-11-03 DIAGNOSIS — R531 Weakness: Secondary | ICD-10-CM

## 2014-11-03 NOTE — Therapy (Signed)
[facility] [address] [place], [place], [address] Phone: [phone]   Fax:  [phone]  Occupational Therapy Treatment  Patient Details  Name: Adrian Neal MRN: [id number] Date of Birth: 12-25-1965 Referring Provider:  Anda Kraft, MD  Encounter Date: 11/03/2014      OT End of Session - 11/03/14 1139    Visit Number 17   Number of Visits 17   Date for OT Re-Evaluation 11/04/14   Authorization Type UHC   Authorization Time Period week 8/8   OT Start Time 1102   OT Stop Time 1145   OT Time Calculation (min) 43 min   Equipment Utilized During Treatment GAIT BELT   Activity Tolerance Patient tolerated treatment well      Past Medical History  Diagnosis Date  . Hypertension   . Diabetes mellitus without complication     diagnosed at age 30  . 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 Eye surgery  1990    for retinopathy   . Cataract extraction w/ intraocular lens implant  1994    There were no vitals filed for this visit.  Visit Diagnosis:  Decreased strength, endurance, and mobility      Subjective Assessment - 11/03/14 1108    Subjective  I feel fine with d/c today   Patient Stated Goals Get my hand to start working better, improve endurance   Currently in Pain? No/denies        OT TREATMENT:   Diagonal reaching activity wt. Shifting b/t LE's bilaterally with slight LOB self corrected. Forward reaching with forward stepping bilaterally, and backwards reaching with backwards stepping bilaterally with slight LOB and able to self correct. Pt walking and retrieving items from floor with wt.shifts and sudden changes in direction, which required pt to widen BOS. Pt then retrieved and replaced items on high shelf with 2# wt. On LUE.  Pt picking up 25# box from floor and placing on tabletop surface (waist high) bilateral sides  w/ min cues for proper body mechanics. Also discussed options for retrieving clothes out of dryer if back bothers him (including: sitting to retrieve, use of reacher, or lifting leg to avoid bending at waist) Pt ambulating while tossing ball Lt hand, Rt hand, and then simultaneously with min drops Lt hand and occasional decreased balance with sudden stops.  UBE x 5 min. Level 12 resistance for UE strength/endurance                         OT Short Term Goals - 09/09/14 0949    OT SHORT TERM GOAL #1   Title Independent w/ HEP for coordination (due 09/15/14)   Time 4   Period Weeks   Status Achieved   OT SHORT TERM GOAL #2   Title Pt to simulate tub transfer with tub transfer bench safely and verbalize understanding of acquistion of DME   Time 4   Period Weeks   Status Deferred  Pt does not wish to pursue purchasing DME at this time   OT SHORT TERM GOAL #3   Title Pt to demo sufficient strength and endurance LUE to retrieve/replace 5 lb. object from overhead shelf x 10 reps without rest   Time 4   Period Weeks   Status Achieved  as of 09/06/14   OT SHORT TERM GOAL #4   Title Pt  to perform complex money exchange with 90% or higher accuracy in prep for work related tasks   Time 4   Period Weeks   Status Achieved  as of 09/06/14   OT SHORT TERM GOAL #5   Title Improve LUE functional use as evidenced by performing 42 or greater blocks on Box & Blocks test   Baseline met 09/09/14-49 blocks   Time 4   Period Weeks   Status Achieved           OT Long Term Goals - 11/03/14 1140    OT LONG TERM GOAL #1   Title Improve coordination as evidenced by performing 9 hole peg test in 25 sec. or under (due 10/15/14)   Baseline 30.81 sec.    Time 8   Period Weeks   Status Achieved  24.66 sec. on 10/10/14   OT LONG TERM GOAL #2   Title Pt to perform dynamic standing tasks for light cleaning for 15 minutes or greater w/o rest or LOB    Time 8   Period Weeks   Status  Partially Met  Pt able to perform dynamic standing for > 15 min. w/o rest. Pt occasionally has minimal self corrected LOB   OT LONG TERM GOAL #3   Title Pt to perform divided attention tasks between physical and cognitive tasks with 90% or greater accuracy in prep for work related tasks   Time 8   Period Weeks   Status Achieved               Plan - 11/03/14 1141    Clinical Impression Statement Pt has met all STG's and LTG's. Partially met LTG #2 due to occasionally w/ LOB, but usually minimal and self corrected   Plan D/C O.T.    OT Home Exercise Plan coordination HEP issued 08/23/14, Theraband HEP LUE 09/19/14   Consulted and Agree with Plan of Care Patient        Problem List Patient Active Problem List   Diagnosis Date Noted  . Abnormal LFTs 08/12/2014  . Ataxia   . CVA (cerebral infarction) 08/03/2014  . ARF (acute renal failure)   . Insulin dependent diabetes mellitus   . 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 Intractable nausea and vomiting 07/27/2014  . Vertigo 07/27/2014  . Essential hypertension 07/27/2014  . Type 1 diabetes mellitus 05/29/2010  . Hyperlipidemia 05/29/2010  . Benign essential HTN 05/29/2010  . PLANTAR FASCIITIS 05/29/2010    OCCUPATIONAL THERAPY DISCHARGE SUMMARY  Visits from Start of Care: 17  Current functional level related to goals / functional outcomes: SEE ABOVE   Remaining deficits: Balance Endurance   Education / Equipment: Pt provided with HEP's  Plan: Patient agrees to discharge.  Patient goals were met. Patient is being discharged due to meeting the stated rehab goals.  P.T. to continue to work on balance                                                                                                                                                                              ?????  Carey Bullocks, OTR/L 11/03/2014, 11:48 AM  [facility] [address] [place], [place], [address] Phone: [phone]   Fax:  [phone]

## 2014-11-04 ENCOUNTER — Ambulatory Visit: Payer: 59 | Admitting: Rehabilitative and Restorative Service Providers"

## 2014-11-04 ENCOUNTER — Encounter: Payer: Self-pay | Admitting: Rehabilitative and Restorative Service Providers"

## 2014-11-04 DIAGNOSIS — H814 Vertigo of central origin: Secondary | ICD-10-CM

## 2014-11-04 DIAGNOSIS — R531 Weakness: Secondary | ICD-10-CM

## 2014-11-04 DIAGNOSIS — I69319 Unspecified symptoms and signs involving cognitive functions following cerebral infarction: Secondary | ICD-10-CM

## 2014-11-04 DIAGNOSIS — IMO0002 Reserved for concepts with insufficient information to code with codable children: Secondary | ICD-10-CM

## 2014-11-04 DIAGNOSIS — Z7409 Other reduced mobility: Secondary | ICD-10-CM | POA: Diagnosis not present

## 2014-11-04 DIAGNOSIS — R269 Unspecified abnormalities of gait and mobility: Secondary | ICD-10-CM

## 2014-11-04 DIAGNOSIS — R6889 Other general symptoms and signs: Secondary | ICD-10-CM

## 2014-11-04 NOTE — Patient Instructions (Signed)
Walking Program:  Begin walking for exercise for 15-18 minutes, 1-2 times/day, 6 days/week.   Progress your walking program by adding 2-3 minutes to your routine each week, as tolerated. Be sure to wear good walking shoes, walk in a safe environment and only progress to your tolerance.

## 2014-11-04 NOTE — Therapy (Signed)
[facility] [address] [place], [place], [address] Phone: [phone]   Fax:  [phone]  Physical Therapy Treatment  Patient Details  Name: Adrian Neal MRN: [id number] Date of Birth: [birth year] Referring Provider:  Anda Kraft, MD  Encounter Date: 11/04/2014      PT End of Session - 11/04/14 1024    Visit Number 22   Number of Visits 24   Date for PT Re-Evaluation 11/10/14   Authorization Type [employer] private, check # of visits after eval   PT Start Time 1022   PT Stop Time 1102   PT Time Calculation (min) 40 min   Equipment Utilized During Treatment Gait belt   Activity Tolerance Patient tolerated treatment well   Behavior During Therapy [facility] for tasks assessed/performed      Past Medical History  Diagnosis Date  . Hypertension   . Diabetes mellitus without complication     diagnosed at age 89  . 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 Eye surgery  1990    for retinopathy   . Cataract extraction w/ intraocular lens implant  1994    There were no vitals filed for this visit.  Visit Diagnosis:  Decreased strength, endurance, and mobility  Lack of coordination due to stroke  Cognitive deficits following cerebral infarction  Abnormality of gait  Vertigo, central origin, unspecified laterality      Subjective Assessment - 11/04/14 1024    Subjective No new complaints or falls. Denies pain.   Currently in Pain? No/denies           [facility] Adult PT Treatment/Exercise - 11/04/14 1044    Ambulation/Gait   Ambulation/Gait Yes   Ambulation/Gait Assistance 5: Supervision;4: Min guard   Ambulation/Gait Assistance Details occasional mild unsteadiness with increased R ankle instability on uneven surfaces, especially grass; supervision on level surfaces with SPC, min guard for safety on uneven surfaces and with no AD on all  surfaces   Ambulation Distance (Feet) 1750 Feet  x1 with SPC (10 min); 750' x1 with no AD (5 min)   Assistive device Straight cane;None   Gait Pattern Decreased step length - right;Decreased step length - left   Ambulation Surface Level;Unlevel;Indoor;Outdoor;Paved;Gravel;Grass      Neuro Re-Ed (min guard for safety, occasional min assist to correct LOB; cues for sequencing and technique): -Obstacle course on level surface and compliant surfaces (red/blue mats) with stepping over hurdles, figure 8 through cones with alt toe tapping at each cone (single tapping for first lap, double tapping for second lap) x2 laps (4x through course)          PT Education - 11/04/14 1132    Education provided Yes   Education Details vocational rehab; walking program   Person(s) Educated Patient   Methods Explanation;Handout   Comprehension Verbalized understanding          PT Short Term Goals - 11/04/14 1138    PT SHORT TERM GOAL #1   Title The patient will be indep with HEP for balance, and mobility.  Target date 09/16/2014   Time 4   Period Weeks   Status Achieved   PT SHORT TERM GOAL #2   Title The patient will improve Berg score from 30/56 up to 36/56 to demo decreased risk for falls.  Target date 09/16/2014   Baseline pt's [name] is currently 44/56   Time 4  Period Weeks   Status Achieved   PT SHORT TERM GOAL #3   Title The patient will ambulate household distances/level surfaces (300 ft) without a device independently.  Target date 09/16/2014   Baseline partially met, due to SBA for safety with transitioning with [facility]   Time 4   Period Weeks   Status Partially Met   PT SHORT TERM GOAL #4   Title The patient will increase walking speed from 1.2 ft/sec up to 1.8 ft/sec to demo decreased risk for falls (with least restrictive device).  Target date 09/16/2014   Baseline pt currently 2.68 ft/second with SPC   Time 4   Period Weeks   Status Achieved   PT SHORT TERM GOAL #5   Title The  patient will negotiate 4 steps x 3 reps with one handrail and reciprocal pattern modified indep. Target date 09/16/2014   Baseline supervision currently required for safety as pt reports he is still cautious with desending steps    Time 4   Period Weeks   Status Partially Met   PT SHORT TERM GOAL #6   Title pt will return understanding of resources available to help aid his transition back to work [employer]) Target date July 14th.    Baseline Met 11/04/14: pt given resources for vocational rehab and returned understanding of resources available   Time 4   Period Weeks   Status Achieved   PT SHORT TERM GOAL #7   Title pt will ambulate 10 minutes (as pt is currently picking up his children from school which requires a 10 minute walk) on outodoor, unlevel surfaces using SPC and demonstrate no LOB. July 14th.   Baseline Met: 11/04/14 pt able to ambulate 10 minutes on outdoor, unlevel surfaces using SPC and demonstrates no LOB   Time 4   Period Weeks   Status Achieved   PT SHORT TERM GOAL #8   Title pt will demonstrate improved functional balance by being able to maintain single limb stance >6 seconds bilaterally to be able to step over community obstacles.            PT Long Term Goals - 11/04/14 1143    PT LONG TERM GOAL #2   Title The patient will improve stroke impact scale mobility by 15% (baseline 64%) to demo improved self perception of mobility.  Target date 11/26/2014.   Time 8   Period Weeks   Status On-going   PT LONG TERM GOAL #6   Title The patient will negotiate level and unlevel community surfaces with least restrictive assistive device x 1200 ft modified indep.  Target date 11/26/2014   Baseline Partially met 11/04/14: Pt able to negotiate level and unlevel community surfaces with SPC for >1200 ft, but still needs supervision and occasional min guard for safety   Time 8   Period Weeks   Status Partially Met               Plan - 11/04/14 1326    Clinical  Impression Statement Continuing to improve mobility, however pt still feeling occasional pull to L and slightly overcompensating to the R. Mildly unsteady on outdoor, uneven surfaces. Demonstrated ability to self-correct  R sway during gait, with occasional min assist needed for balance during dynamic balance on compliant surface. Pt met STGs #6 & #7, and partially met LTG #6. Demonstrated improved endurance by ability to ambulate 10 minutes on outdoor, unlevel surfaces using SPC.   Pt will benefit from skilled therapeutic intervention in  order to improve on the following deficits Abnormal gait;Difficulty walking;Postural dysfunction;Decreased balance;Decreased activity tolerance;Decreased mobility   Rehab Potential Good   PT Frequency 2x / week   PT Duration 8 weeks   PT Treatment/Interventions Therapeutic activities;Patient/family education;Therapeutic exercise;Manual techniques;Balance training;Gait training;Neuromuscular re-education;Stair training;Functional mobility training   PT Next Visit Plan balance control with head turns/eyes closed; balance on compliant surfaces; gait training on uneven surfaces; weightbearing and weightshifting to left    Consulted and Agree with Plan of Care Patient        Problem List Patient Active Problem List   Diagnosis Date Noted  . Abnormal LFTs 08/12/2014  . Ataxia   . CVA (cerebral infarction) 08/03/2014  . ARF (acute renal failure)   . Insulin dependent diabetes mellitus   . 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 Intractable nausea and vomiting 07/27/2014  . Vertigo 07/27/2014  . Essential hypertension 07/27/2014  . Type 1 diabetes mellitus 05/29/2010  . Hyperlipidemia 05/29/2010  . Benign essential HTN 05/29/2010  . PLANTAR FASCIITIS 05/29/2010  This entire session was performed under direct supervision and direction of a licensed therapist/therapist assistant . I have personally read, edited and approve of the note as  written. Rudell Cobb, [place], [place] 11/04/2014, 1:38 PM   Rubye Oaks, [facility] [address] [place], [place], [address] Phone: [phone]   Fax:  [phone]

## 2014-11-07 ENCOUNTER — Encounter: Payer: Self-pay | Admitting: Physical Therapy

## 2014-11-07 ENCOUNTER — Ambulatory Visit: Payer: 59 | Admitting: Physical Therapy

## 2014-11-07 DIAGNOSIS — Z7409 Other reduced mobility: Secondary | ICD-10-CM | POA: Diagnosis not present

## 2014-11-07 DIAGNOSIS — R269 Unspecified abnormalities of gait and mobility: Secondary | ICD-10-CM

## 2014-11-07 DIAGNOSIS — H814 Vertigo of central origin: Secondary | ICD-10-CM

## 2014-11-07 DIAGNOSIS — I69319 Unspecified symptoms and signs involving cognitive functions following cerebral infarction: Secondary | ICD-10-CM

## 2014-11-07 DIAGNOSIS — IMO0002 Reserved for concepts with insufficient information to code with codable children: Secondary | ICD-10-CM

## 2014-11-07 DIAGNOSIS — R531 Weakness: Secondary | ICD-10-CM

## 2014-11-07 DIAGNOSIS — R6889 Other general symptoms and signs: Secondary | ICD-10-CM

## 2014-11-07 NOTE — Therapy (Signed)
[facility] [address] [place], [place], [address] Phone: [phone]   Fax:  [phone]  Physical Therapy Treatment  Patient Details  Name: Adrian Neal MRN: [id number] Date of Birth: August 24, 1965 Referring Provider:  Anda Kraft, MD  Encounter Date: 11/07/2014      PT End of Session - 11/07/14 0851    Visit Number 23   Number of Visits 24   Date for PT Re-Evaluation 11/10/14   Authorization Type [employer] private, check # of visits after eval   PT Start Time [phone]   PT Stop Time 0930   PT Time Calculation (min) 43 min   Equipment Utilized During Treatment Gait belt   Activity Tolerance Patient tolerated treatment well   Behavior During Therapy [facility] for tasks assessed/performed      Past Medical History  Diagnosis Date  . Hypertension   . Diabetes mellitus without complication     diagnosed at age 7  . 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 Eye surgery  1990    for retinopathy   . Cataract extraction w/ intraocular lens implant  1994    There were no vitals filed for this visit.  Visit Diagnosis:  Decreased strength, endurance, and mobility  Lack of coordination due to stroke  Cognitive deficits following cerebral infarction  Abnormality of gait  Vertigo, central origin, unspecified laterality      Subjective Assessment - 11/07/14 1030    Subjective No new complaints or falls. Denies pain. Pt reported walking in neighborhood for an hour and a half with SPC with occasional, brief rest breaks. No significant fatigue and felt comfortable walking up and down hills with no loss of balance.   Currently in Pain? No/denies          [facility] Adult PT Treatment/Exercise - 11/07/14 1694    Ambulation/Gait   Ambulation/Gait Yes   Ambulation/Gait Assistance 6: Modified independent (Device/Increase time)   Ambulation/Gait  Assistance Details increased time   Ambulation Distance (Feet) 400 Feet   Assistive device None   Gait Pattern Step-through pattern   Ambulation Surface Level;Indoor   Stairs Yes   Stairs Assistance 6: Modified independent (Device/Increase time)   Stairs Assistance Details (indicate cue type and reason) increased time   Stair Management Technique One rail Right;One rail Left;Alternating pattern;Forwards   Number of Stairs 4  x4     Neuro Re-Ed (min guard for safety; cues for technique and ex form): -In parallel bars on air ex (occasional LOB; able to self-correct with occasional UE support): toe taps to front 2x20; toe taps to back 2x20; toe taps to side 2x20; standing marching 2x10; R/L SLS 3x15 sec each side (more frequent LOB; slight UE support needed) -In parallel bars on level surface: L SLS rolling ball back and forth under R foot 3x30 sec -On red mat (occasional misstep, able to self-correct): forward stepping over hurdles with mini squat after each hurdle x2 laps; side stepping over hurdles with mini squat after each hurdle x2 laps          PT Short Term Goals - 11/07/14 1042    PT SHORT TERM GOAL #1   Title The patient will be indep with HEP for balance, and mobility.  Target date 09/16/2014   Time 4   Period Weeks   Status Achieved   PT SHORT TERM GOAL #2   Title The  patient will improve Berg score from 30/56 up to 36/56 to demo decreased risk for falls.  Target date 09/16/2014   Baseline pt's [name] is currently 44/56   Time 4   Period Weeks   Status Achieved   PT SHORT TERM GOAL #3   Title The patient will ambulate household distances/level surfaces (300 ft) without a device independently.  Target date 09/16/2014   Baseline partially met, due to SBA for safety with transitioning with [facility]; partially met 11/07/14: able to ambulate 400' on level, indoor surface without AD, but modified independent due to increased time   Time 4   Period Weeks   Status Partially Met   PT  SHORT TERM GOAL #4   Title The patient will increase walking speed from 1.2 ft/sec up to 1.8 ft/sec to demo decreased risk for falls (with least restrictive device).  Target date 09/16/2014   Baseline pt currently 2.68 ft/second with SPC   Time 4   Period Weeks   Status Achieved   PT SHORT TERM GOAL #5   Title The patient will negotiate 4 steps x 3 reps with one handrail and reciprocal pattern modified indep. 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    Baseline Met 11/04/14: pt given resources for vocational rehab and returned understanding of resources available   Time 4   Period Weeks   Status Achieved   PT SHORT TERM GOAL #7   Title pt will ambulate 10 minutes (as pt is currently picking up his children from school which requires a 10 minute walk) on outodoor, unlevel surfaces using SPC and demonstrate no LOB. July 14th.   Baseline Met: 11/04/14 pt able to ambulate 10 minutes on outdoor, unlevel surfaces using SPC and demonstrates no LOB   Time 4   Period Weeks   Status Achieved   PT SHORT TERM GOAL #8   Title pt will demonstrate improved functional balance by being able to maintain single limb stance >6 seconds bilaterally to be able to step over community obstacles.            PT Long Term Goals - 11/04/14 1143    PT LONG TERM GOAL #2   Title The patient will improve stroke impact scale mobility by 15% (baseline 64%) to demo improved self perception of mobility.  Target date 11/26/2014.   Time 8   Period Weeks   Status On-going   PT LONG TERM GOAL #6   Title The patient will  negotiate level and unlevel community surfaces with least restrictive assistive device x 1200 ft modified indep.  Target date 11/26/2014   Baseline Partially met 11/04/14: Pt able to negotiate level and unlevel community surfaces with [facility] for >1200 ft, but still needs supervision and occasional min guard for safety   Time 8   Period Weeks   Status Partially Met           Plan - 11/07/14 1045    Clinical Impression Statement Continuing to make significant progress with mobility and balance. Demonstrated ability to self-correct occasional misstep during  dynamic balance activities on compliant surface. Challenged with SLS on compliant surface. Pt met STG #5 (negotiating stairs modified independent), and made significant progress toward STG #3 (able to ambulate on level, indoor surfaces without AD modified independent).    Pt will benefit from skilled therapeutic intervention in order to improve on the following deficits Abnormal gait;Difficulty walking;Postural dysfunction;Decreased balance;Decreased activity tolerance;Decreased mobility   Rehab Potential Good   PT Frequency 2x / week   PT Duration 8 weeks   PT Treatment/Interventions Therapeutic activities;Patient/family education;Therapeutic exercise;Manual techniques;Balance training;Gait training;Neuromuscular re-education;Stair training;Functional mobility training   PT Next Visit Plan balance control with head turns/eyes closed; balance on compliant surfaces; gait training on uneven surfaces; weightbearing and weightshifting to left; assess STGs not met.   Consulted and Agree with Plan of Care Patient        Problem List Patient Active Problem List   Diagnosis Date Noted  . Abnormal LFTs 08/12/2014  . Ataxia   . CVA (cerebral infarction) 08/03/2014  . ARF (acute renal failure)   . Insulin dependent diabetes mellitus   . 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 Intractable nausea and vomiting 07/27/2014  . Vertigo  07/27/2014  . Essential hypertension 07/27/2014  . Type 1 diabetes mellitus 05/29/2010  . Hyperlipidemia 05/29/2010  . Benign essential HTN 05/29/2010  . PLANTAR FASCIITIS 05/29/2010    Rubye Oaks 11/07/2014, 10:58 AM  Rubye Oaks, SPTA  [place], [place], [place] [address] [place], [place] [address] [phone] 11/08/2014, 9:19 AM

## 2014-11-11 ENCOUNTER — Ambulatory Visit: Payer: 59 | Admitting: Physical Therapy

## 2014-11-11 ENCOUNTER — Encounter: Payer: Self-pay | Admitting: Physical Therapy

## 2014-11-11 DIAGNOSIS — R531 Weakness: Secondary | ICD-10-CM

## 2014-11-11 DIAGNOSIS — H814 Vertigo of central origin: Secondary | ICD-10-CM

## 2014-11-11 DIAGNOSIS — R269 Unspecified abnormalities of gait and mobility: Secondary | ICD-10-CM

## 2014-11-11 DIAGNOSIS — Z7409 Other reduced mobility: Secondary | ICD-10-CM

## 2014-11-11 DIAGNOSIS — R6889 Other general symptoms and signs: Secondary | ICD-10-CM

## 2014-11-11 DIAGNOSIS — IMO0002 Reserved for concepts with insufficient information to code with codable children: Secondary | ICD-10-CM

## 2014-11-11 NOTE — Therapy (Signed)
[facility] [address] [place], [place], [address] Phone: [phone]   Fax:  [phone]  Physical Therapy Treatment  Patient Details  Name: Adrian Neal MRN: [id number] Date of Birth: [birth year] Referring Provider:  Anda Kraft, MD  Encounter Date: 11/11/2014      PT End of Session - 11/11/14 1236    Visit Number 24   Number of Visits 40  number never adjusted after renewal   Date for PT Re-Evaluation 11/26/14   Authorization Type [employer] private, check # of visits after eval   PT Start Time 1234   PT Stop Time 1315   PT Time Calculation (min) 41 min   Equipment Utilized During Treatment Gait belt   Activity Tolerance Patient tolerated treatment well   Behavior During Therapy [facility] for tasks assessed/performed      Past Medical History  Diagnosis Date  . Hypertension   . Diabetes mellitus without complication     diagnosed at age 8  . 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 Eye surgery  1990    for retinopathy   . Cataract extraction w/ intraocular lens implant  1994    There were no vitals filed for this visit.  Visit Diagnosis:  Abnormality of gait  Vertigo, central origin, unspecified laterality  Decreased strength, endurance, and mobility  Lack of coordination due to stroke      Subjective Assessment - 11/11/14 1338    Subjective No new complaints or falls. Denies pain. Pt reports walking about a mile in neighborhood without [facility] with one rest break halfway. Reports no fatigue or unsteadiness.   Currently in Pain? No/denies           [facility] Adult PT Treatment/Exercise - 11/11/14 1249    Ambulation/Gait   Ambulation/Gait Yes   Ambulation/Gait Assistance 6: Modified independent (Device/Increase time);5: Supervision;4: Min guard   Ambulation/Gait Assistance Details modified independent on indoor/level surfaces;  supervision on outdoor, paved surfaces; min guard due to increased unsteadiness on outdoor uneven surfaces   Ambulation Distance (Feet) 1300 Feet  300' indoors, 1000' outdoors   Assistive device None   Gait Pattern Step-through pattern;Decreased step length - right;Decreased step length - left   Ambulation Surface Level;Unlevel;Indoor;Outdoor;Paved;Gravel;Grass     Neuro Re-Ed (min guard with occasional min assist for balance and safety; cues for sequencing and technique): -Obstacle course around track: 2 laps on level surface and various compliant surfaces (red/blue mats) with randomized stepping over hurdles and figure 8 through cones with alt toe taps at each cone; 3 laps using same course as before with addition of tandem walking on foam beam (single HHA needed for balance) and alt toe taps on air ex with one cone in the middle of a figure 8 sequence          PT Short Term Goals - 11/11/14 1342    PT SHORT TERM GOAL #1   Title The patient will be indep with HEP for balance, and mobility.  Target date 09/16/2014   Time 4   Period Weeks   Status Achieved   PT SHORT TERM GOAL #2   Title The patient will improve Berg score from 30/56 up to 36/56 to demo decreased risk for falls.  Target date 09/16/2014   Baseline pt's [name] is currently 49/56   Time 4   Period Weeks   Status Achieved   PT SHORT  TERM GOAL #3   Title The patient will ambulate household distances/level surfaces (300 ft) without a device independently.  Target date 09/16/2014   Baseline partially met, due to SBA for safety with transitioning with [facility]; partially met 11/07/14: able to ambulate 400' on level, indoor surface without AD, but modified independent due to increased time   Time 4   Period Weeks   Status Partially Met   PT SHORT TERM GOAL #4   Title The patient will increase walking speed from 1.2 ft/sec up to 1.8 ft/sec to demo decreased risk for falls (with least restrictive device).  Target date 09/16/2014    Baseline pt currently 2.68 ft/second with SPC   Time 4   Period Weeks   Status Achieved   PT SHORT TERM GOAL #5   Title The patient will negotiate 4 steps x 3 reps with one handrail and reciprocal pattern modified indep. 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    Baseline Met 11/04/14: pt given resources for vocational rehab and returned understanding of resources available   Time 4   Period Weeks   Status Achieved   PT SHORT TERM GOAL #7   Title pt will ambulate 10 minutes (as pt is currently picking up his children from school which requires a 10 minute walk) on outodoor, unlevel surfaces using SPC and demonstrate no LOB. July 14th.   Baseline Met: 11/04/14 pt able to ambulate 10 minutes on outdoor, unlevel surfaces using SPC and demonstrates no LOB   Time 4   Period Weeks   Status Achieved   PT SHORT TERM GOAL #8   Title pt will demonstrate improved functional balance by being able to maintain single limb stance >6 seconds bilaterally to be able to step over community obstacles.    Baseline 11/11/14: pt demonstrated ability to lift each LE independently with several attempts and maintain SLS for 1-2 seconds   Status On-going           PT Long Term Goals - 11/04/14 1143    PT LONG TERM GOAL #2   Title The patient will improve stroke impact scale mobility by 15% (baseline 64%) to demo improved self perception of mobility.  Target date 11/26/2014.   Time 8   Period Weeks   Status On-going   PT LONG TERM GOAL #6   Title The patient will negotiate level and unlevel community surfaces  with least restrictive assistive device x 1200 ft modified indep.  Target date 11/26/2014   Baseline Partially met 11/04/14: Pt able to negotiate level and unlevel community surfaces with [facility] for >1200 ft, but still needs supervision and occasional min guard for safety   Time 8   Period Weeks   Status Partially Met           Plan - 11/11/14 1344    Clinical Impression Statement Continues to be challenged with dynamic balance activities, especially on compliant surfaces. Pt continuing to feel occasional L pull, particularly on grass and noncompliant surfaces. Requires occasional min assist to correct LOB on compliant surfaces,  but frequently demonstrates ability to self-correct. Continuing to make progress toward goals.   Pt will benefit from skilled therapeutic intervention in order to improve on the following deficits Abnormal gait;Difficulty walking;Postural dysfunction;Decreased balance;Decreased activity tolerance;Decreased mobility   Rehab Potential Good   PT Frequency 2x / week   PT Duration 8 weeks   PT Treatment/Interventions Therapeutic activities;Patient/family education;Therapeutic exercise;Manual techniques;Balance training;Gait training;Neuromuscular re-education;Stair training;Functional mobility training   PT Next Visit Plan single limb stance balance activities; balance control with head turns/eyes closed; balance on compliant surfaces; gait training on uneven surfaces; weightbearing and weightshifting to left    Consulted and Agree with Plan of Care Patient        Problem List Patient Active Problem List   Diagnosis Date Noted  . Abnormal LFTs 08/12/2014  . Ataxia   . CVA (cerebral infarction) 08/03/2014  . ARF (acute renal failure)   . Insulin dependent diabetes mellitus   . 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 Intractable nausea and vomiting 07/27/2014  . Vertigo 07/27/2014  . Essential hypertension 07/27/2014  . Type 1 diabetes mellitus  05/29/2010  . Hyperlipidemia 05/29/2010  . Benign essential HTN 05/29/2010  . PLANTAR FASCIITIS 05/29/2010    Rubye Oaks 11/11/2014, 1:53 PM  Rubye Oaks, [place] [address] [place], [place], [address] Phone: [phone]   Fax:  [phone]

## 2014-11-14 ENCOUNTER — Encounter: Payer: Self-pay | Admitting: Physical Therapy

## 2014-11-14 ENCOUNTER — Ambulatory Visit: Payer: 59 | Admitting: Physical Therapy

## 2014-11-14 DIAGNOSIS — H814 Vertigo of central origin: Secondary | ICD-10-CM

## 2014-11-14 DIAGNOSIS — R531 Weakness: Secondary | ICD-10-CM

## 2014-11-14 DIAGNOSIS — Z7409 Other reduced mobility: Secondary | ICD-10-CM | POA: Diagnosis not present

## 2014-11-14 DIAGNOSIS — R269 Unspecified abnormalities of gait and mobility: Secondary | ICD-10-CM

## 2014-11-14 NOTE — Therapy (Signed)
[facility] [address] [place], [place], [address] Phone: [phone]   Fax:  [phone]  Physical Therapy Treatment  Patient Details  Name: Adrian Neal MRN: [id number] Date of Birth: 14-Apr-1966 Referring Provider:  Anda Kraft, MD  Encounter Date: 11/14/2014      PT End of Session - 11/14/14 1018    Visit Number 25   Number of Visits 40   Date for PT Re-Evaluation 11/26/14   Authorization Type [employer] private, check # of visits after eval   PT Start Time 1016   PT Stop Time 1100   PT Time Calculation (min) 44 min   Equipment Utilized During Treatment Gait belt   Activity Tolerance Patient tolerated treatment well   Behavior During Therapy [facility] for tasks assessed/performed      Past Medical History  Diagnosis Date  . Hypertension   . Diabetes mellitus without complication     diagnosed at age 64  . 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 Eye surgery  1990    for retinopathy   . Cataract extraction w/ intraocular lens implant  1994    There were no vitals filed for this visit.  Visit Diagnosis:  Abnormality of gait  Vertigo, central origin, unspecified laterality  Decreased strength, endurance, and mobility      Subjective Assessment - 11/14/14 1034    Subjective No falls or pain to report. Reports feeling L pull more than usual during walk on Saturday with occasional unsteadiness.   Currently in Pain? No/denies           [facility] Adult PT Treatment/Exercise - 11/14/14 1033    Ambulation/Gait   Ambulation/Gait Yes   Ambulation/Gait Assistance 5: Supervision;4: Min guard   Ambulation/Gait Assistance Details occasional unsteadiness or misstep, especially on grass, requiring min guard for safety   Ambulation Distance (Feet) 2000 Feet   Assistive device None   Gait Pattern Step-through pattern;Decreased step length -  right;Decreased step length - left   Ambulation Surface Level;Unlevel;Indoor;Outdoor;Paved;Gravel;Grass      Neuro Re-Ed (min guard for safety; cues for technique and ex form): -At base of stairs, alt tapping 3 steps up and 3 steps down 3x10 (frequent single UE support required to correct LOB) -Sliding bean bags across floor x1 round focusing on weight shift to L by standing on L LE and sliding bean bag with R foot -L SLS in parallel bars with frequent slight UE support to correct LOB 3x30 sec with pt attempting to maintain L SLS as long as possible without LOB (gradual improvement with pt able to consistently maintain L SLS for 4-5 seconds at a time) -On red/blue mats - stepping over hurdles x 2.5 laps (occasional misstep, able to self-correct; min assist to correct LOB x1)          PT Short Term Goals - 11/11/14 1342    PT SHORT TERM GOAL #1   Title The patient will be indep with HEP for balance, and mobility.  Target date 09/16/2014   Time 4   Period Weeks   Status Achieved   PT SHORT TERM GOAL #2   Title The patient will improve Berg score from 30/56 up to 36/56 to demo decreased risk for falls.  Target date 09/16/2014   Baseline pt's [name] is currently 44/56   Time 4   Period Weeks   Status Achieved   PT  SHORT TERM GOAL #3   Title The patient will ambulate household distances/level surfaces (300 ft) without a device independently.  Target date 09/16/2014   Baseline partially met, due to SBA for safety with transitioning with [facility]; partially met 11/07/14: able to ambulate 400' on level, indoor surface without AD, but modified independent due to increased time   Time 4   Period Weeks   Status Partially Met   PT SHORT TERM GOAL #4   Title The patient will increase walking speed from 1.2 ft/sec up to 1.8 ft/sec to demo decreased risk for falls (with least restrictive device).  Target date 09/16/2014   Baseline pt currently 2.68 ft/second with SPC   Time 4   Period Weeks   Status  Achieved   PT SHORT TERM GOAL #5   Title The patient will negotiate 4 steps x 3 reps with one handrail and reciprocal pattern modified indep. 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    Baseline Met 11/04/14: pt given resources for vocational rehab and returned understanding of resources available   Time 4   Period Weeks   Status Achieved   PT SHORT TERM GOAL #7   Title pt will ambulate 10 minutes (as pt is currently picking up his children from school which requires a 10 minute walk) on outodoor, unlevel surfaces using SPC and demonstrate no LOB. July 14th.   Baseline Met: 11/04/14 pt able to ambulate 10 minutes on outdoor, unlevel surfaces using SPC and demonstrates no LOB   Time 4   Period Weeks   Status Achieved   PT SHORT TERM GOAL #8   Title pt will demonstrate improved functional balance by being able to maintain single limb stance >6 seconds bilaterally to be able to step over community obstacles.    Baseline 11/11/14: pt demonstrated ability to lift each LE independently with several attempts and maintain SLS for 1-2 seconds   Status On-going           PT Long Term Goals - 11/04/14 1143    PT LONG TERM GOAL #2   Title The patient will improve stroke impact scale mobility by 15% (baseline 64%) to demo improved self perception of mobility.  Target date 11/26/2014.   Time 8   Period Weeks   Status On-going   PT LONG TERM GOAL #6   Title The patient will negotiate level and unlevel community surfaces with least restrictive assistive device x 1200 ft modified indep.  Target date  11/26/2014   Baseline Partially met 11/04/14: Pt able to negotiate level and unlevel community surfaces with [facility] for >1200 ft, but still needs supervision and occasional min guard for safety   Time 8   Period Weeks   Status Partially Met            Plan - 11/14/14 1213    Clinical Impression Statement Focused on L single limb stance activities this session with pt demonstrating improvement in ability to maintain SLS on L as session progressed. Continues to be challenged with dynamic balance activities on compliant  surfaces, demonstrating ability to self-correct occasional misstep and only required min assist to correct LOB once. Making progress toward goals.   Pt will benefit from skilled therapeutic intervention in order to improve on the following deficits Abnormal gait;Difficulty walking;Postural dysfunction;Decreased balance;Decreased activity tolerance;Decreased mobility   Rehab Potential Good   PT Frequency 2x / week   PT Duration 8 weeks   PT Treatment/Interventions Therapeutic activities;Patient/family education;Therapeutic exercise;Manual techniques;Balance training;Gait training;Neuromuscular re-education;Stair training;Functional mobility training   PT Next Visit Plan single limb stance balance activities; balance control with head turns/eyes closed; balance on compliant surfaces; gait training on uneven surfaces; weightbearing and weightshifting to left    Consulted and Agree with Plan of Care Patient        Problem List Patient Active Problem List   Diagnosis Date Noted  . Abnormal LFTs 08/12/2014  . Ataxia   . CVA (cerebral infarction) 08/03/2014  . ARF (acute renal failure)   . Insulin dependent diabetes mellitus   . 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 Intractable nausea and vomiting 07/27/2014  . Vertigo 07/27/2014  . Essential hypertension 07/27/2014  . Type 1 diabetes mellitus 05/29/2010  . Hyperlipidemia 05/29/2010  . Benign essential HTN  05/29/2010  . PLANTAR FASCIITIS 05/29/2010    Rubye Oaks 11/14/2014, 12:21 PM  Rubye Oaks, [place] [address] [place], [place], [address] Phone: [phone]   Fax:  [phone]

## 2014-11-16 ENCOUNTER — Encounter: Payer: Self-pay | Admitting: Physical Therapy

## 2014-11-16 ENCOUNTER — Ambulatory Visit: Payer: 59 | Admitting: Physical Therapy

## 2014-11-16 DIAGNOSIS — H814 Vertigo of central origin: Secondary | ICD-10-CM

## 2014-11-16 DIAGNOSIS — Z7409 Other reduced mobility: Secondary | ICD-10-CM | POA: Diagnosis not present

## 2014-11-16 DIAGNOSIS — IMO0002 Reserved for concepts with insufficient information to code with codable children: Secondary | ICD-10-CM

## 2014-11-16 DIAGNOSIS — R269 Unspecified abnormalities of gait and mobility: Secondary | ICD-10-CM

## 2014-11-16 DIAGNOSIS — R531 Weakness: Secondary | ICD-10-CM

## 2014-11-16 NOTE — Therapy (Signed)
[facility] [address] [place], [place], [address] Phone: [phone]   Fax:  [phone]  Physical Therapy Treatment  Patient Details  Name: Adrian Neal MRN: [id number] Date of Birth: 08/20/1965 Referring Provider:  Anda Kraft, MD  Encounter Date: 11/16/2014   11/16/14 1021  PT Visits / Re-Eval  Visit Number 26  Number of Visits 40  Date for PT Re-Evaluation 11/26/14  Authorization  Authorization Type [employer] private, check # of visits after eval  PT Time Calculation  PT Start Time 1017  PT Stop Time 1100  PT Time Calculation (min) 43 min  PT - End of Session  Equipment Utilized During Treatment Gait belt  Activity Tolerance Patient tolerated treatment well  Behavior During Therapy [facility] for tasks assessed/performed     Past Medical History  Diagnosis Date  . Hypertension   . Diabetes mellitus without complication     diagnosed at age 40  . 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 Eye surgery  1990    for retinopathy   . Cataract extraction w/ intraocular lens implant  1994    11/16/14 1021  Symptoms/Limitations  Subjective No pain or falls to report.   Pain Assessment  Currently in Pain? No/denies  Pain Score 0    There were no vitals filed for this visit.  Visit Diagnosis:  Abnormality of gait  Vertigo, central origin, unspecified laterality  Decreased strength, endurance, and mobility  Lack of coordination due to stroke     11/16/14 1023  Ambulation/Gait  Ambulation/Gait Yes  Ambulation/Gait Assistance 4: Min guard;4: Min assist  Ambulation/Gait Assistance Details stayed on complaint surfaces more so that the paved surfaces. pt with decreased balance noted and reported feeling more dizzy/left push with gait on compliant surfaces.  Ambulation Distance (Feet) 2000 Feet  Assistive device None  Gait Pattern  Step-through pattern;Decreased step length - right;Decreased step length - left  Ambulation Surface Level;Indoor;Unlevel;Outdoor;Paved;Gravel;Grass (mostly grass outdoors, very little pavement/gravel)      Neuro Re-ed:  With ball held [place], trunk flexion forward toward ground (bending at hips) with rotation down toward one foot then back upright, alternating sides X 10 reps with feet wide on floor X 10 reps with feet together on floor X 10 reps with feet wide on blue: increased assist for balance needed X 10 reps with feet together on blue mat: increased assist for balance needed  In corner on floor: Turning in place x 5 one direction, then x 5 the other way. 3 sets each way. Small standing rest between each set to allow dizziness to resolve  Blue mat on ramp Facing up ramp with wide base of support: marching in place, added head turns up/down and left/right. Cues on posture and technique. Min to mod assist for balance. Cues for coordination of activity as well.   Gait in hallway 6 laps forward with head turns left/right, min assist for balance with veering noted and inconsistent gait speed. 6 laps forward with head nods up/down, min assist for balance with veering noted and inconsistent gait speed        PT Short Term Goals - 11/11/14 1342    PT SHORT TERM GOAL #1   Title The patient will be indep with HEP for balance, and mobility.  Target date 09/16/2014   Time 4   Period Weeks   Status Achieved   PT SHORT  TERM GOAL #2   Title The patient will improve Berg score from 30/56 up to 36/56 to demo decreased risk for falls.  Target date 09/16/2014   Baseline pt's [name] is currently 44/56   Time 4   Period Weeks   Status Achieved   PT SHORT TERM GOAL #3   Title The patient will ambulate household distances/level surfaces (300 ft) without a device independently.  Target date 09/16/2014   Baseline partially met, due to SBA for safety with transitioning with [facility]; partially met 11/07/14:  able to ambulate 400' on level, indoor surface without AD, but modified independent due to increased time   Time 4   Period Weeks   Status Partially Met   PT SHORT TERM GOAL #4   Title The patient will increase walking speed from 1.2 ft/sec up to 1.8 ft/sec to demo decreased risk for falls (with least restrictive device).  Target date 09/16/2014   Baseline pt currently 2.68 ft/second with SPC   Time 4   Period Weeks   Status Achieved   PT SHORT TERM GOAL #5   Title The patient will negotiate 4 steps x 3 reps with one handrail and reciprocal pattern modified indep. 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    Baseline Met 11/04/14: pt given resources for vocational rehab and returned understanding of resources available   Time 4   Period Weeks   Status Achieved   PT SHORT TERM GOAL #7   Title pt will ambulate 10 minutes (as pt is currently picking up his children from school which requires a 10 minute walk) on outodoor, unlevel surfaces using SPC and demonstrate no LOB. July 14th.   Baseline Met: 11/04/14 pt able to ambulate 10 minutes on outdoor, unlevel surfaces using SPC and demonstrates no LOB   Time 4   Period Weeks   Status Achieved   PT SHORT TERM GOAL #8   Title pt will demonstrate improved functional balance by being able to maintain single limb stance >6 seconds bilaterally to be able to step over community obstacles.    Baseline 11/11/14: pt demonstrated ability to lift each LE independently with several attempts and maintain SLS for 1-2 seconds   Status On-going           PT  Long Term Goals - 11/04/14 1143    PT LONG TERM GOAL #2   Title The patient will improve stroke impact scale mobility by 15% (baseline 64%) to demo improved self perception of mobility.  Target date 11/26/2014.   Time 8   Period Weeks   Status On-going   PT LONG TERM GOAL #6   Title The patient will negotiate level and unlevel community surfaces with least restrictive assistive device x 1200 ft modified indep.  Target date 11/26/2014   Baseline Partially met 11/04/14: Pt able to negotiate level and unlevel community surfaces with SPC for >1200 ft, but still needs supervision and occasional min guard for safety   Time 8   Period Weeks   Status Partially Met  11/16/14 1022  Plan  Clinical Impression Statement Pt with significant balance issues with gait with head turns, turning in place and balance activites on unlevel compliant surfaces today. Mild dizziness evoked with these activities as well that resolved quickly with rest/no motion. Pt making steady progress toward goals.  Pt will benefit from skilled therapeutic intervention in order to improve on the following deficits Abnormal gait;Difficulty walking;Postural dysfunction;Decreased balance;Decreased activity tolerance;Decreased mobility  Rehab Potential Good  PT Frequency 2x / week  PT Duration 8 weeks  PT Treatment/Interventions Therapeutic activities;Patient/family education;Therapeutic exercise;Manual techniques;Balance training;Gait training;Neuromuscular re-education;Stair training;Functional mobility training  PT Next Visit Plan single limb stance balance activities; balance control with head turns/eyes closed; balance on compliant surfaces; gait training on uneven surfaces; weightbearing and weightshifting to left   Consulted and Agree with Plan of Care Patient     Problem List Patient Active Problem List   Diagnosis Date Noted  . Abnormal LFTs 08/12/2014  . Ataxia   . CVA (cerebral infarction) 08/03/2014  . ARF (acute  renal failure)   . Insulin dependent diabetes mellitus   . 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 Intractable nausea and vomiting 07/27/2014  . Vertigo 07/27/2014  . Essential hypertension 07/27/2014  . Type 1 diabetes mellitus 05/29/2010  . Hyperlipidemia 05/29/2010  . Benign essential HTN 05/29/2010  . PLANTAR FASCIITIS 05/29/2010    Willow Ora 11/18/2014, 8:34 AM  Willow Ora, PTA, [facility] [address], [place], [place] [address] [phone] 11/18/2014, 8:34 AM

## 2014-11-18 ENCOUNTER — Encounter: Payer: Self-pay | Admitting: Physical Medicine & Rehabilitation

## 2014-11-18 ENCOUNTER — Other Ambulatory Visit: Payer: Self-pay | Admitting: Physical Medicine & Rehabilitation

## 2014-11-18 ENCOUNTER — Encounter: Payer: 59 | Attending: Physical Medicine & Rehabilitation | Admitting: Physical Medicine & Rehabilitation

## 2014-11-18 VITALS — BP 114/49 | HR 76 | Resp 14

## 2014-11-18 DIAGNOSIS — H8149 Vertigo of central origin, unspecified ear: Secondary | ICD-10-CM | POA: Insufficient documentation

## 2014-11-18 DIAGNOSIS — R270 Ataxia, unspecified: Secondary | ICD-10-CM | POA: Diagnosis not present

## 2014-11-18 DIAGNOSIS — R42 Dizziness and giddiness: Secondary | ICD-10-CM | POA: Diagnosis not present

## 2014-11-18 DIAGNOSIS — Z7409 Other reduced mobility: Secondary | ICD-10-CM | POA: Insufficient documentation

## 2014-11-18 DIAGNOSIS — R279 Unspecified lack of coordination: Secondary | ICD-10-CM | POA: Diagnosis not present

## 2014-11-18 DIAGNOSIS — I6330 Cerebral infarction due to thrombosis of unspecified cerebral artery: Secondary | ICD-10-CM

## 2014-11-18 DIAGNOSIS — I6980 Unspecified sequelae of other cerebrovascular disease: Secondary | ICD-10-CM | POA: Insufficient documentation

## 2014-11-18 NOTE — Progress Notes (Signed)
Subjective:    Patient ID: Adrian Neal, male    DOB: 1966/01/27, 49 y.o.   MRN: [id number]  HPI   Adrian Neal is here in follow up of his CVA. He feels that his balance is improving although he still struggles on the steps. He has to be careful stepping up onto a curve---usually has to slow down. Sometimes he stumbles and his left knee locks.  Vision is good. Stamina still needs to work.  He is still working with PT, but has discharged from OT. He has to squint with his right eye to assist his vision---his visual acuity has improved somewhat  He still wants to go back to work at some point, although he has no idea when that might be. His FLMA ran out apparently since I last saw him.    Pain Inventory Average Pain 0 Pain Right Now 0 My pain is no pain  In the last 24 hours, has pain interfered with the following? General activity 0 Relation with others 0 Enjoyment of life 0 What TIME of day is your pain at its worst? no pain Sleep (in general) Good  Pain is worse with: no pain Pain improves with: no pain Relief from Meds: no pain  Mobility walk without assistance ability to climb steps?  yes do you drive?  no  Function disabled: date disabled .  Neuro/Psych dizziness  Prior Studies Any changes since last visit?  no  Physicians involved in your care Any changes since last visit?  no   Family History  Problem Relation Age of Onset  . Stroke Mother   . Hypertension Mother   . Hyperlipidemia Mother   . Hyperlipidemia Father   . Hypertension Father   . Diabetes Father    History   Social History  . Marital Status: Married    Spouse Name: N/A  . Number of Children: N/A  . Years of Education: N/A   Social History Main Topics  . Smoking status: Former Smoker    Quit date: 04/30/1991  . Smokeless tobacco: Not on file  . Alcohol Use: No  . Drug Use: No  . Sexual Activity: Not on file   Other Topics Concern  . None   Social History Narrative    Past Surgical History  Procedure Laterality Date  . Eye surgery  1990    for retinopathy   . Cataract extraction w/ intraocular lens implant  1994   Past Medical History  Diagnosis Date  . Hypertension   . Diabetes mellitus without complication     diagnosed at age 31  . Retinopathy due to secondary diabetes mellitus     right   BP 114/49 mmHg  Pulse 76  Resp 14  SpO2 97%  Opioid Risk Score:   Fall Risk Score:  `1  Depression screen PHQ 2/9  Depression screen PHQ 2/9 09/19/2014  Decreased Interest 0  Down, Depressed, Hopeless 0  PHQ - 2 Score 0  Altered sleeping 0  Tired, decreased energy 0  Change in appetite 0  Feeling bad or failure about yourself  0  Trouble concentrating 0  Moving slowly or fidgety/restless 0  Suicidal thoughts 0  PHQ-9 Score 0     Review of Systems  Neurological: Positive for dizziness.  All other systems reviewed and are negative.      Objective:   Physical Exam  Constitutional: He is oriented to person, place, and time. He appears well-developed and well-nourished. comfortable  HENT: oral mucosa  pink and moist  Head: Normocephalic and atraumatic.  Eyes: Conjunctivae are normal. Pupils are equal, round, and reactive to light.  Neck: Normal range of motion. Neck supple.  Cardiovascular: Normal rate and regular rhythm. no murmur  Respiratory: Effort normal and breath sounds normal. No respiratory distress. He has no wheezes. He exhibits no tenderness.  GI: Soft. Bowel sounds are normal. He exhibits no distension. There is no tenderness.  Musculoskeletal: He exhibits no edema or tenderness.  Neurological: He is alert and oriented to person, place, and time.  Strength nearly 5/5 to 5/5 in all 4's. Speech clear and articulate. Follows commands without difficulty. Mild Ataxia with left finger to nose is mild but persistent.  Romberg positive but can correct with additional time. Gait without walker, wide based and sways from left to right.  no falls or stumbles. Can't walk in tandem still because balance. 4-5 beats of nystagmus with gaze to right and left. Still squints with right eye to improve visual acuity  Psychiatric: Calm and appropriate  Skin: Skin is warm and dry.  Psychiatric: He has a normal mood and affect. His speech is normal and behavior is normal. Judgment and thought content normal. Cognition and memory are normal.    Assessment/Plan:  1. 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  -more appropriate likely for a sedentary position when he's ready for that---at this point he remains pre-vocational for even a sedentary job 3. Pain Management: N/A  4 CKD: per pcp  5. DM type 1: improved control   I'll see him back in about 3 months. Thirty minutes of face to face patient care time were spent during this visit. All questions were encouraged and answered.

## 2014-11-21 ENCOUNTER — Ambulatory Visit: Payer: 59 | Admitting: Rehabilitative and Restorative Service Providers"

## 2014-11-21 DIAGNOSIS — R269 Unspecified abnormalities of gait and mobility: Secondary | ICD-10-CM

## 2014-11-21 DIAGNOSIS — Z7409 Other reduced mobility: Secondary | ICD-10-CM | POA: Diagnosis not present

## 2014-11-21 NOTE — Therapy (Signed)
[facility] [address] [place], [place], [address] Phone: [phone]   Fax:  [phone]  Physical Therapy Treatment  Patient Details  Name: Adrian Neal MRN: [id number] Date of Birth: 1965-07-13 Referring Provider:  Anda Kraft, MD  Encounter Date: 11/21/2014      PT End of Session - 11/21/14 1353    Visit Number 27   Number of Visits 40   Date for PT Re-Evaluation 11/26/14   Authorization Type U[employer] private $20 copay   PT Start Time 1018   PT Stop Time 1105   PT Time Calculation (min) 47 min   Equipment Utilized During Treatment Gait belt   Activity Tolerance Patient tolerated treatment well   Behavior During Therapy [facility] for tasks assessed/performed      Past Medical History  Diagnosis Date  . Hypertension   . Diabetes mellitus without complication     diagnosed at age 49  . 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 Eye surgery  1990    for retinopathy   . Cataract extraction w/ intraocular lens implant  1994    There were no vitals filed for this visit.  Visit Diagnosis:  Abnormality of gait      Subjective Assessment - 11/21/14 1025    Subjective The patient reports he stopped using his cane 2 weeks ago with some stumbling episodes.  He notes the most challenging tasks are jumping, not being able to run.  Dizziness occurs inconsistently.  He does not know of one provoking movement.  He notes instead instability after sitting for longer periods.    Currently in Pain? No/denies     Gait: Community ambulation on level/unlevel surfaces without a device with SBA to CGA due to occasional loss of balance on unlevel surfaces. Tandem gait with min to mod A with significant loss of balance.  NEUROMUSCULAR RE-EDUCATION: Standing diagonal reaches from ground to contralateral overhead with exaggerated head movements with no  dizziness noted Single limb stance activities Tandem stance added for HEP  THERAPEUTIC ACTIVITIES: STanding hopping activities working on L LE plyometric/speed with mini hops in place, wide to narrow emphasizing ankle control, and then tried step, hop to begin skipping activities. Standing wide leg mini marches for pre jogging activities March and hold for pre jogging activities          PT Education - 11/21/14 1352    Education provided Yes   Education Details Emphasized HEP continuation of single leg stance, tandem stance and heel raises in L leg stance.   Person(s) Educated Patient   Methods Explanation;Demonstration;Handout   Comprehension Returned demonstration;Verbalized understanding          PT Short Term Goals - 11/11/14 1342    PT SHORT TERM GOAL #1   Title The patient will be indep with HEP for balance, and mobility.  Target date 09/16/2014   Time 4   Period Weeks   Status Achieved   PT SHORT TERM GOAL #2   Title The patient will improve Berg score from 30/56 up to 36/56 to demo decreased risk for falls.  Target date 09/16/2014   Baseline pt's [name] is currently 49/56   Time 4   Period Weeks   Status Achieved   PT SHORT TERM GOAL #3   Title The patient will ambulate household distances/level surfaces (300 ft) without a device independently.  Target date 09/16/2014  Baseline partially met, due to SBA for safety with transitioning with SPC; partially met 11/07/14: able to ambulate 400' on level, indoor surface without AD, but modified independent due to increased time   Time 4   Period Weeks   Status Partially Met   PT SHORT TERM GOAL #4   Title The patient will increase walking speed from 1.2 ft/sec up to 1.8 ft/sec to demo decreased risk for falls (with least restrictive device).  Target date 09/16/2014   Baseline pt currently 2.68 ft/second with SPC   Time 4   Period Weeks   Status Achieved   PT SHORT TERM GOAL #5   Title The patient will negotiate 4 steps x  3 reps with one handrail and reciprocal pattern modified indep. 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    Baseline Met 11/04/14: pt given resources for vocational rehab and returned understanding of resources available   Time 4   Period Weeks   Status Achieved   PT SHORT TERM GOAL #7   Title pt will ambulate 10 minutes (as pt is currently picking up his children from school which requires a 10 minute walk) on outodoor, unlevel surfaces using SPC and demonstrate no LOB. July 14th.   Baseline Met: 11/04/14 pt able to ambulate 10 minutes on outdoor, unlevel surfaces using SPC and demonstrates no LOB   Time 4   Period Weeks   Status Achieved   PT SHORT TERM GOAL #8   Title pt will demonstrate improved functional balance by being able to maintain single limb stance >6 seconds bilaterally to be able to step over community obstacles.    Baseline 11/11/14: pt demonstrated ability to lift each LE independently with several attempts and maintain SLS for 1-2 seconds   Status On-going           PT Long Term Goals - 11/04/14 1143    PT LONG TERM GOAL #2   Title The patient will improve stroke impact scale mobility by 15% (baseline 64%) to demo improved self perception of mobility.  Target date 11/26/2014.   Time 8   Period Weeks   Status On-going   PT LONG TERM GOAL #6   Title The patient will negotiate level and unlevel community surfaces with least restrictive assistive device x 1200 ft modified indep.  Target date 11/26/2014   Baseline Partially met 11/04/14: Pt able to negotiate level and unlevel  community surfaces with [facility] for >1200 ft, but still needs supervision and occasional min guard for safety   Time 8   Period Weeks   Status Partially Met               Plan - 11/21/14 1401    Clinical Impression Statement The patient continues with wide base of support, decreased high level balance, and decreased L LE speed/coordination for high level tasks (return to playing recreational basketball, jogging, etc).   PT Next Visit Plan Discuss hold x 3-4 weeks for patient to focus on targetted HEP and then return to try to progress to higher level  tasks for recreational exercise.   Consulted and Agree with Plan of Care Patient        Problem List Patient Active Problem List   Diagnosis Date Noted  . Abnormal LFTs 08/12/2014  . Ataxia   . CVA (cerebral infarction) 08/03/2014  . ARF (acute renal failure)   . Insulin dependent diabetes mellitus   . 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 Intractable nausea and vomiting 07/27/2014  . Vertigo 07/27/2014  . Essential hypertension 07/27/2014  . Type 1 diabetes mellitus 05/29/2010  . Hyperlipidemia 05/29/2010  . Benign essential HTN 05/29/2010  . PLANTAR FASCIITIS 05/29/2010    Blenda Wisecup, PT 11/21/2014, 2:03 PM  [facility] [address] [place], [place], [address] Phone: [phone]   Fax:  [phone]

## 2014-11-21 NOTE — Patient Instructions (Signed)
Single Leg Balance: Eyes Open   Stand on right leg with eyes open. Hold _10__ seconds. __3_ reps _2__ times per day.  http://ggbe.exer.us/4   Copyright  VHI. All rights reserved.   Tandem Stance   Right foot in front of left, heel touching toe both feet "straight ahead". Stand on Foot Triangle of Support with both feet. Balance in this position _30 __ seconds. Do with left foot in front of right. 3 times on each leg. Up to 2 times/day. Copyright  VHI. All rights reserved.   Heel Raise: Unilateral (Standing)   Balance on left foot, then rise on ball of foot. Repeat _10-20___ times per set. Do __2__ sets per session. Do _2___ sessions per day.  http://orth.exer.us/40   Copyright  VHI. All rights reserved.

## 2014-11-22 ENCOUNTER — Ambulatory Visit (INDEPENDENT_AMBULATORY_CARE_PROVIDER_SITE_OTHER): Payer: 59 | Admitting: Neurology

## 2014-11-22 ENCOUNTER — Encounter: Payer: Self-pay | Admitting: Neurology

## 2014-11-22 VITALS — BP 105/62 | HR 75 | Ht 68.0 in | Wt 177.6 lb

## 2014-11-22 DIAGNOSIS — E785 Hyperlipidemia, unspecified: Secondary | ICD-10-CM

## 2014-11-22 DIAGNOSIS — I6302 Cerebral infarction due to thrombosis of basilar artery: Secondary | ICD-10-CM | POA: Diagnosis not present

## 2014-11-22 DIAGNOSIS — I10 Essential (primary) hypertension: Secondary | ICD-10-CM

## 2014-11-22 DIAGNOSIS — E782 Mixed hyperlipidemia: Secondary | ICD-10-CM | POA: Insufficient documentation

## 2014-11-22 DIAGNOSIS — E1159 Type 2 diabetes mellitus with other circulatory complications: Secondary | ICD-10-CM

## 2014-11-22 NOTE — Patient Instructions (Signed)
-   continue ASA and statin for stroke prevention - Follow up with your primary care physician for stroke risk factor modification. Recommend maintain blood pressure goal <130/80, diabetes with hemoglobin A1c goal below 6.5% and lipids with LDL cholesterol goal below 70 mg/dL.  - check BP and glucose at home - continue PT/OT for gait and coordination. Also home exercise - follow up in 4 months.

## 2014-11-22 NOTE — Progress Notes (Signed)
STROKE NEUROLOGY FOLLOW UP NOTE  NAME: Adrian Neal DOB: March 05, 1966  REASON FOR VISIT: stroke follow up HISTORY FROM: pt and chart  Today we had the pleasure of seeing Adrian Neal in follow-up at our Neurology Clinic. Pt was accompanied by no one.   History Summary [name] is a 49 y.o. male with history of hypertension, diabetes, and hyperlipidemia presenting with nausea, vomiting, and dizziness on 07/27/14. Exam showed left UE ataxia. MRI showed acute punctate left cerebellar peduncle infarct. CUS unremarkable, 2D echo showed LVH, LDL 70 and A1C 8.1. He was discharge to CIR in good condition with ASA and zocor.   Interval History During the interval time, the patient has been doing well. No recurrent symptoms. He still works with PT/OT as outpt for gait and balance. His LUE ataxia much improved but still has gait incoordination. He still feels some subtle LUE fine motor incoordination at times. His recent A1C was 7.1 but LDL 135. He follows with PCP regularly. BP today 105/62.    REVIEW OF SYSTEMS: Full 14 system review of systems performed and notable only for those listed below and in HPI above, all others are negative:  Constitutional:   Cardiovascular:  Ear/Nose/Throat:   Skin:  Eyes:   Respiratory:   Gastroitestinal:   Genitourinary:  Hematology/Lymphatic:   Endocrine:  Musculoskeletal:   Allergy/Immunology:   Neurological:  dizziness Psychiatric:  Sleep:   The following represents the patient's updated allergies and side effects list: No Known Allergies  The neurologically relevant items on the patient's problem list were reviewed on today's visit.  Neurologic Examination  A problem focused neurological exam (12 or more points of the single system neurologic examination, vital signs counts as 1 point, cranial nerves count for 8 points) was performed.  Blood pressure 105/62, pulse 75, height 5\' 8"  (1.727 m), weight 177 lb 9.6 oz (80.559  kg).  General - Well nourished, well developed, in no apparent distress.  Ophthalmologic - Fundi not visualized due to small pupils.  Cardiovascular - Regular rate and rhythm with no murmur.  Mental Status -  Level of arousal and orientation to time, place, and person were intact. Language including expression, naming, repetition, comprehension was assessed and found intact. Fund of Knowledge was assessed and was intact.  Cranial Nerves II - XII - II - Visual field intact OU. III, IV, VI - Extraocular movements intact. V - Facial sensation intact bilaterally. VII - Facial movement intact bilaterally. VIII - Hearing & vestibular intact bilaterally. X - Palate elevates symmetrically. XI - Chin turning & shoulder shrug intact bilaterally. XII - Tongue protrusion intact.  Motor Strength - The patient's strength was normal in all extremities and pronator drift was absent.  Bulk was normal and fasciculations were absent.   Motor Tone - Muscle tone was assessed at the neck and appendages and was normal.  Reflexes - The patient's reflexes were 1+ in all extremities and he had no pathological reflexes.  Sensory - Light touch, temperature/pinprick were assessed and were normal.    Coordination - The patient had normal movements in the hands and feet with no ataxia or dysmetria.  Tremor was absent.  Gait and Station - broad based gait, en bloc turns, cautious gait and slow gait.  Data reviewed: I personally reviewed the images and agree with the radiology interpretations.  Mr Brain Wo Contrast 07/28/2014  Acute sub cm LEFT mesial brachium pontis infarct. Otherwise normal MRI of the brain without contrast for  age. Mild paranasal sinusitis, trace LEFT mastoid effusion.   US Renal 07/28/2014  Negative.   Dg Chest Port 1 View 07/28/2014  No active disease.   CUS - Bilateral: 1-39% ICA stenosis. Vertebral artery flow is antegrade.  2D echo - - Left ventricle: The cavity  size was normal. Wall thickness was increased in a pattern of mild LVH. Systolic function was normal. The estimated ejection fraction was in the range of 60% to 65%. Wall motion was normal; there were no regional wall motion abnormalities. Features are consistent with a pseudonormal left ventricular filling pattern, with concomitant abnormal relaxation and increased filling pressure (grade 2 diastolic dysfunction).  Component     Latest Ref Rng 07/28/2014  Cholesterol     0 - 200 mg/dL 134  Triglycerides     <150 mg/dL 66  HDL Cholesterol     >39 mg/dL 51  Total CHOL/HDL Ratio      2.6  VLDL     0 - 40 mg/dL 13  LDL (calc)     0 - 99 mg/dL 70  Hemoglobin A1C     4.8 - 5.6 % 8.1 (H)  Mean Plasma Glucose      186  TSH     0.350 - 4.500 uIU/mL 2.756    Assessment: As you may recall, he is a 49 y.o. Caucasian male with PMH of HTN, HLD, DM was admitted on 07/27/14 for left cerebellar peduncle infarcts, felt to be due to small vessel disease. His A1C 8.1 and LDL 70. CUS unremarkable and TTE showed LVH. He was put on ASA and zocor. During the interval time, he was doing well, continued on PT/OT, still has mild gait incoordination difficulty. A1C better to 7.1 but LDL elevated to 135.   Plan:  - continue ASA and statin for stroke prevention - Follow up with your primary care physician for stroke risk factor modification. Recommend maintain blood pressure goal <130/80, diabetes with hemoglobin A1c goal below 6.5% and lipids with LDL cholesterol goal below 70 mg/dL.  - check BP and glucose at home - continue PT/OT for gait and coordination. Also home exercise - monitor CUS and perform TCD in one year. - follow up in 4 months.  No orders of the defined types were placed in this encounter.    No orders of the defined types were placed in this encounter.    Patient Instructions  - continue ASA and statin for stroke prevention - Follow up with your primary care physician  for stroke risk factor modification. Recommend maintain blood pressure goal <130/80, diabetes with hemoglobin A1c goal below 6.5% and lipids with LDL cholesterol goal below 70 mg/dL.  - check BP and glucose at home - continue PT/OT for gait and coordination. Also home exercise - follow up in 4 months.    Rosalin Hawking, MD PhD [facility] [address], [place],  [place] [address] [phone]

## 2014-11-23 ENCOUNTER — Ambulatory Visit: Payer: 59 | Admitting: Rehabilitative and Restorative Service Providers"

## 2014-11-23 DIAGNOSIS — Z7409 Other reduced mobility: Secondary | ICD-10-CM | POA: Diagnosis not present

## 2014-11-23 DIAGNOSIS — H814 Vertigo of central origin: Secondary | ICD-10-CM

## 2014-11-23 DIAGNOSIS — R269 Unspecified abnormalities of gait and mobility: Secondary | ICD-10-CM

## 2014-11-23 NOTE — Therapy (Signed)
[facility] [address] [place], [place], [address] Phone: [phone]   Fax:  [phone]  Physical Therapy Treatment  Patient Details  Name: Adrian Neal MRN: [id number] Date of Birth: 17-May-1965 Referring Provider:  Anda Kraft, MD  Encounter Date: 11/23/2014      PT End of Session - 11/23/14 1333    Visit Number 28   Number of Visits 40   Date for PT Re-Evaluation 11/26/14   Authorization Type [employer] private $20 copay   PT Start Time 1018   PT Stop Time 1100   PT Time Calculation (min) 42 min   Equipment Utilized During Treatment Gait belt   Activity Tolerance Patient tolerated treatment well   Behavior During Therapy [facility] for tasks assessed/performed      Past Medical History  Diagnosis Date  . Hypertension   . Diabetes mellitus without complication     diagnosed at age 63  . 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 Stroke     Past Surgical History  Procedure Laterality Date  . Eye surgery  1990    for retinopathy   . Cataract extraction w/ intraocular lens implant  1994    There were no vitals filed for this visit.  Visit Diagnosis:  Abnormality of gait  Vertigo, central origin, unspecified laterality      Subjective Assessment - 11/23/14 1024    Subjective Tried HEP and it is still challenging.   Currently in Pain? No/denies      NEUROMUSCULAR RE-EDUCATION: Reviewed standing on L LE with heel raises x 10 reps Standing L LE with mini squats with knee control/CGA Heel raises on 6" step letting heels drop below neutral for greater ROM in strengthening  Gait: Quick walking with turns reaching to floor and back to standing in short intervals for quick direction changes with CGA Sidestepping and quick marching in multi-directional movements for functional tasks Dynamic gait activities with direction changes  SELF CARE/HOME MANAGEMENT: Discussed HEP continuation  until f/u visit with PT Recommended SPC on unlevel surfaces for safety Discussed progression of home walking program      PT Education - 11/23/14 1053    Education provided Yes   Education Details HEP: heel raise on step   Person(s) Educated Patient   Methods Explanation;Demonstration;Handout   Comprehension Verbalized understanding;Returned demonstration          PT Short Term Goals - 11/11/14 1342    PT SHORT TERM GOAL #1   Title The patient will be indep with HEP for balance, and mobility.  Target date 09/16/2014   Time 4   Period Weeks   Status Achieved   PT SHORT TERM GOAL #2   Title The patient will improve Berg score from 30/56 up to 36/56 to demo decreased risk for falls.  Target date 09/16/2014   Baseline pt's [name] is currently 44/56   Time 4   Period Weeks   Status Achieved   PT SHORT TERM GOAL #3   Title The patient will ambulate household distances/level surfaces (300 ft) without a device independently.  Target date 09/16/2014   Baseline partially met, due to SBA for safety with transitioning with [facility]; partially met 11/07/14: able to ambulate 400' on level, indoor surface without AD, but modified independent due to increased time   Time 4   Period Weeks   Status Partially Met   PT SHORT TERM GOAL #4   Title The patient will  increase walking speed from 1.2 ft/sec up to 1.8 ft/sec to demo decreased risk for falls (with least restrictive device).  Target date 09/16/2014   Baseline pt currently 2.68 ft/second with SPC   Time 4   Period Weeks   Status Achieved   PT SHORT TERM GOAL #5   Title The patient will negotiate 4 steps x 3 reps with one handrail and reciprocal pattern modified indep. Target date 09/16/2014   Baseline supervision currently required for safety as pt reports he is still cautious with desending steps; met 11/07/14: able to negotiate 4 steps x 4 reps with 1 handrail and reciprocal pattern modified independent   Time 4   Period Weeks   Status Achieved    PT SHORT TERM GOAL #6   Title pt will return understanding of resources available to help aid his transition back to work [employer] certified [employer]) Target date July 14th.    Baseline Met 11/04/14: pt given resources for vocational rehab and returned understanding of resources available   Time 4   Period Weeks   Status Achieved   PT SHORT TERM GOAL #7   Title pt will ambulate 10 minutes (as pt is currently picking up his children from school which requires a 10 minute walk) on outodoor, unlevel surfaces using SPC and demonstrate no LOB. July 14th.   Baseline Met: 11/04/14 pt able to ambulate 10 minutes on outdoor, unlevel surfaces using SPC and demonstrates no LOB   Time 4   Period Weeks   Status Achieved   PT SHORT TERM GOAL #8   Title pt will demonstrate improved functional balance by being able to maintain single limb stance >6 seconds bilaterally to be able to step over community obstacles.    Baseline 11/11/14: pt demonstrated ability to lift each LE independently with several attempts and maintain SLS for 1-2 seconds   Status On-going           PT Long Term Goals - 11/04/14 1143    PT LONG TERM GOAL #2   Title The patient will improve stroke impact scale mobility by 15% (baseline 64%) to demo improved self perception of mobility.  Target date 11/26/2014.   Time 8   Period Weeks   Status On-going   PT LONG TERM GOAL #6   Title The patient will negotiate level and unlevel community surfaces with least restrictive assistive device x 1200 ft modified indep.  Target date 11/26/2014   Baseline Partially met 11/04/14: Pt able to negotiate level and unlevel community surfaces with [facility] for >1200 ft, but still needs supervision and occasional min guard for safety   Time 8   Period Weeks   Status Partially Met               Plan - 11/23/14 1335    Clinical Impression Statement PT to have patient work on focused HEP and return to clinic in 3-4 weeks to see if impairments improving in order  to progress further towards patient goals of return to recreational tasks and unlevel community ambulation without a device.   PT Next Visit Plan f/u in 3-4 weeks; check HEP, determine if easier to perform and if we can progress to further recreational tasks (basketball, jogging, unlevel surfaces).   Consulted and Agree with Plan of Care Patient        Problem List Patient Active Problem List   Diagnosis Date Noted  . HLD (hyperlipidemia) 11/22/2014  . Type 2 diabetes mellitus with other circulatory complications 37/34/2876  .  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 Abnormal LFTs 08/12/2014  . Ataxia   . CVA (cerebral infarction) 08/03/2014  . ARF (acute renal failure)   . Insulin dependent diabetes mellitus   . 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 Intractable nausea and vomiting 07/27/2014  . Vertigo 07/27/2014  . Essential hypertension 07/27/2014  . Type 1 diabetes mellitus 05/29/2010  . Hyperlipidemia 05/29/2010  . Benign essential HTN 05/29/2010  . PLANTAR FASCIITIS 05/29/2010    Amirra Herling, PT 11/23/2014, 1:37 PM  [facility] [address] [place], [place], [address] Phone: [phone]   Fax:  [phone]

## 2014-11-23 NOTE — Patient Instructions (Signed)
Heel Raise: Standing   HOLD RAILS Toes on board, heels on floor, knees slightly bent, rise up on toes as high as possible. Do __1-2__ sets. Complete __10-20__ repetitions.  http://st.exer.us/132   Copyright  VHI. All rights reserved.

## 2014-12-27 ENCOUNTER — Ambulatory Visit: Payer: 59 | Attending: Physical Medicine & Rehabilitation | Admitting: Rehabilitative and Restorative Service Providers"

## 2014-12-27 DIAGNOSIS — R269 Unspecified abnormalities of gait and mobility: Secondary | ICD-10-CM | POA: Diagnosis present

## 2014-12-28 ENCOUNTER — Encounter: Payer: Self-pay | Admitting: Rehabilitative and Restorative Service Providers"

## 2014-12-28 NOTE — Therapy (Signed)
[facility] [address] [place], [place], [address] Phone: [phone]   Fax:  [phone]  Physical Therapy Treatment  Patient Details  Name: Adrian Neal MRN: [id number] Date of Birth: 04/28/1966 Referring Provider:  Anda Kraft, MD  Encounter Date: 12/27/2014      PT End of Session - 12/27/14 2208    Visit Number 29   Number of Visits 40   Date for PT Re-Evaluation 11/26/14   Authorization Type U[employer] private $20 copay   PT Start Time 1330   PT Stop Time 1400   PT Time Calculation (min) 30 min   Activity Tolerance Patient tolerated treatment well   Behavior During Therapy [facility] for tasks assessed/performed      Past Medical History  Diagnosis Date  . Hypertension   . Diabetes mellitus without complication     diagnosed at age 38  . 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 Stroke     Past Surgical History  Procedure Laterality Date  . Eye surgery  1990    for retinopathy   . Cataract extraction w/ intraocular lens implant  1994    There were no vitals filed for this visit.  Visit Diagnosis:  Abnormality of gait      Subjective Assessment - 12/27/14 1328    Subjective The patient presents to PT to f/u after performing HEP regularly.  He reports continued dizziness with no improvement since last PT session.  He notes a sensation of lightheadedness almost like his blood pressure is dropping however he has checked his BP and it is not low.  He  had a fall last Wednesday night when trying to catch his phone in his driveway and he fell backwards.  He also notes that his left knee is feeling less steady with occasional sensations of "giving way" in knee.        SELF CARE/HOME MANAGEMENT: Discussed home program, fall that patient had last week, fear of falling, challenges in the home, home walking and community exercise programs.  PT provided [employer] open [employer] scholarship  information.  Discussed return to recreational activities and recommended the patient continue current HEP, however add variety with community programs.  Also discussed breaking down a task to make it safer (ie. Patient mentions he wants to jump rope- PT had patient try hopping on one leg and patient unable near countertop.  Discussed that this would be a better first step to try vs. Grabbing a jump rope).   NEUROMUSCULAR RE-EDUCATION: Hopping bilateral and unilateral with UE support Wide to narrow hopping Wide leg marching Discussed progression of single leg stance activities           PT Education - 12/27/14 2207    Education provided Yes   Education Details [employer] scholarship forms, community centers, continuing HEP after d/c.   Person(s) Educated Patient   Methods Explanation   Comprehension Verbalized understanding          PT Short Term Goals - 11/11/14 1342    PT SHORT TERM GOAL #1   Title The patient will be indep with HEP for balance, and mobility.  Target date 09/16/2014   Time 4   Period Weeks   Status Achieved   PT SHORT TERM GOAL #2   Title The patient will improve Berg score from 30/56 up to 36/56 to demo decreased risk for falls.  Target date 09/16/2014   Baseline pt's [name] is currently 72/56  Time 4   Period Weeks   Status Achieved   PT SHORT TERM GOAL #3   Title The patient will ambulate household distances/level surfaces (300 ft) without a device independently.  Target date 09/16/2014   Baseline partially met, due to SBA for safety with transitioning with [facility]; partially met 11/07/14: able to ambulate 400' on level, indoor surface without AD, but modified independent due to increased time   Time 4   Period Weeks   Status Partially Met   PT SHORT TERM GOAL #4   Title The patient will increase walking speed from 1.2 ft/sec up to 1.8 ft/sec to demo decreased risk for falls (with least restrictive device).  Target date 09/16/2014   Baseline pt currently 2.68  ft/second with SPC   Time 4   Period Weeks   Status Achieved   PT SHORT TERM GOAL #5   Title The patient will negotiate 4 steps x 3 reps with one handrail and reciprocal pattern modified indep. 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    Baseline Met 11/04/14: pt given resources for vocational rehab and returned understanding of resources available   Time 4   Period Weeks   Status Achieved   PT SHORT TERM GOAL #7   Title pt will ambulate 10 minutes (as pt is currently picking up his children from school which requires a 10 minute walk) on outodoor, unlevel surfaces using SPC and demonstrate no LOB. July 14th.   Baseline Met: 11/04/14 pt able to ambulate 10 minutes on outdoor, unlevel surfaces using SPC and demonstrates no LOB   Time 4   Period Weeks   Status Achieved   PT SHORT TERM GOAL #8   Title pt will demonstrate improved functional balance by being able to maintain single limb stance >6 seconds bilaterally to be able to step over community obstacles.    Baseline 11/11/14: pt demonstrated ability to lift each LE independently with several attempts and maintain SLS for 1-2 seconds   Status On-going           PT Long Term Goals - 12/27/14 2209    PT LONG TERM GOAL #2   Title The patient will improve stroke impact scale mobility by 15% (baseline 64%) to demo improved self perception of mobility.  Target date 11/26/2014.   Baseline PT and patient did not reassess Stroke impact scale during last session- discussed progression of HEP.   Time 8   Period Weeks   Status Not Met   PT LONG  TERM GOAL #6   Title The patient will negotiate level and unlevel community surfaces with least restrictive assistive device x 1200 ft modified indep.  Target date 11/26/2014   Baseline Partially met 11/04/14: Pt able to negotiate level and unlevel community surfaces with [facility] for >1200 ft, but still needs supervision and occasional min guard for safety   Time 8   Period Weeks   Status Partially Met               Plan - 12/27/14 2211  Clinical Impression Statement Patient continuing with instability on unlevel surfaces and during quick movements.  The patient has not returned to prior recreational activities.  His current HEP is appropriate level at this time for patient to continue working on impairment based activities for further progress.  He expresses some frustration noting progress has slowed down recently.  PT provided information for community wellness activities in order to continue to progress activiity level.   Pt will benefit from skilled therapeutic intervention in order to improve on the following deficits Abnormal gait;Difficulty walking;Postural dysfunction;Decreased balance;Decreased activity tolerance;Decreased mobility   Rehab Potential Good   PT Frequency --  one final PT visit for f/u on HEP    PT Treatment/Interventions Therapeutic activities;Patient/family education;Therapeutic exercise;Manual techniques;Balance training;Gait training;Neuromuscular re-education;Stair training;Functional mobility training   PT Next Visit Plan discharge with HEP +community resources.   Consulted and Agree with Plan of Care Patient        Problem List Patient Active Problem List   Diagnosis Date Noted  . HLD (hyperlipidemia) 11/22/2014  . Type 2 diabetes mellitus with other circulatory complications 66/44/0347  . 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 Abnormal LFTs 08/12/2014  . Ataxia   . CVA (cerebral infarction) 08/03/2014  . ARF (acute renal  failure)   . Insulin dependent diabetes mellitus   . 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 Intractable nausea and vomiting 07/27/2014  . Vertigo 07/27/2014  . Essential hypertension 07/27/2014  . Type 1 diabetes mellitus 05/29/2010  . Hyperlipidemia 05/29/2010  . Benign essential HTN 05/29/2010  . PLANTAR FASCIITIS 05/29/2010    Lenny Bouchillon, PT 12/28/2014, 10:18 AM  [facility] [address] [place], [place], [address] Phone: [phone]   Fax:  [phone]

## 2014-12-28 NOTE — Therapy (Signed)
[facility] [address] [place], [place], [address] Phone: [phone]   Fax:  [phone]  Patient Details  Name: KALIX MEINECKE MRN: [id number] Date of Birth: 03/23/1966 Referring Provider:  No ref. provider found  Encounter Date: 12/27/3014  PHYSICAL THERAPY DISCHARGE SUMMARY  Visits from Start of Care: 29  Current functional level related to goals / functional outcomes:     PT Short Term Goals - 11/11/14 1342    PT SHORT TERM GOAL #1   Title The patient will be indep with HEP for balance, and mobility.  Target date 09/16/2014   Time 4   Period Weeks   Status Achieved   PT SHORT TERM GOAL #2   Title The patient will improve Berg score from 30/56 up to 36/56 to demo decreased risk for falls.  Target date 09/16/2014   Baseline pt's [name] is currently 44/56   Time 4   Period Weeks   Status Achieved   PT SHORT TERM GOAL #3   Title The patient will ambulate household distances/level surfaces (300 ft) without a device independently.  Target date 09/16/2014   Baseline partially met, due to SBA for safety with transitioning with [facility]; partially met 11/07/14: able to ambulate 400' on level, indoor surface without AD, but modified independent due to increased time   Time 4   Period Weeks   Status Partially Met   PT SHORT TERM GOAL #4   Title The patient will increase walking speed from 1.2 ft/sec up to 1.8 ft/sec to demo decreased risk for falls (with least restrictive device).  Target date 09/16/2014   Baseline pt currently 2.68 ft/second with SPC   Time 4   Period Weeks   Status Achieved   PT SHORT TERM GOAL #5   Title The patient will negotiate 4 steps x 3 reps with one handrail and reciprocal pattern modified indep. 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    Baseline Met 11/04/14: pt given resources for vocational rehab and returned understanding of resources available   Time 4   Period Weeks   Status Achieved   PT SHORT TERM GOAL #7   Title pt will ambulate 10 minutes (as pt is currently picking up his children from school which requires a 10 minute walk) on outodoor, unlevel surfaces using SPC and demonstrate no LOB. July 14th.   Baseline Met: 11/04/14 pt able to ambulate 10 minutes on outdoor, unlevel surfaces using SPC and demonstrates no LOB   Time 4   Period Weeks   Status Achieved   PT SHORT TERM GOAL #8   Title pt will demonstrate improved functional balance by being able to maintain single limb stance >6 seconds bilaterally to be able to step over community obstacles.    Baseline 11/11/14: pt demonstrated ability to lift each LE independently with several attempts and maintain SLS for 1-2 seconds   Status On-going         PT Long Term Goals - 12/27/14 2209    PT LONG TERM GOAL #2  Title The patient will improve stroke impact scale mobility by 15% (baseline 64%) to demo improved self perception of mobility.  Target date 11/26/2014.   Baseline PT and patient did not reassess Stroke impact scale during last session- discussed progression of HEP.   Time 8   Period Weeks   Status Not Met   PT LONG TERM GOAL #6   Title The patient will negotiate level and unlevel community surfaces with least restrictive assistive device x 1200 ft modified indep.  Target date 11/26/2014   Baseline Partially met 11/04/14: Pt able to negotiate level and unlevel community surfaces with SPC for >1200 ft, but still needs supervision and occasional min guard for safety   Time 8   Period Weeks   Status Partially Met         Remaining deficits: Ataxia, wide base of support, instability, decreased dynamic gait, dizziness "pulling" sensation.   Education / Equipment: HEP, community wellness, home safety.  Plan: Patient agrees to discharge.  Patient goals were partially met. Patient is being discharged due to meeting the stated rehab goals.  ?????       Thank you for the referral of this patient. Rudell Cobb, MPT  [place] 12/28/2014, 10:24 AM  [facility] [address] [place], [place], [address] Phone: [phone]   Fax:  [phone]

## 2015-01-19 ENCOUNTER — Inpatient Hospital Stay (HOSPITAL_COMMUNITY)
Admission: EM | Admit: 2015-01-19 | Discharge: 2015-01-20 | DRG: 065 | Disposition: A | Discharge status: Home / Self Care | Payer: 59 | Attending: Internal Medicine | Admitting: Internal Medicine

## 2015-01-19 ENCOUNTER — Emergency Department (HOSPITAL_COMMUNITY): Payer: 59

## 2015-01-19 ENCOUNTER — Encounter (HOSPITAL_COMMUNITY): Payer: Self-pay | Admitting: Emergency Medicine

## 2015-01-19 DIAGNOSIS — Z9641 Presence of insulin pump (external) (internal): Secondary | ICD-10-CM | POA: Diagnosis present

## 2015-01-19 DIAGNOSIS — Z87891 Personal history of nicotine dependence: Secondary | ICD-10-CM | POA: Diagnosis not present

## 2015-01-19 DIAGNOSIS — Z8673 Personal history of transient ischemic attack (TIA), and cerebral infarction without residual deficits: Secondary | ICD-10-CM

## 2015-01-19 DIAGNOSIS — E039 Hypothyroidism, unspecified: Secondary | ICD-10-CM | POA: Diagnosis present

## 2015-01-19 DIAGNOSIS — Z7982 Long term (current) use of aspirin: Secondary | ICD-10-CM | POA: Diagnosis not present

## 2015-01-19 DIAGNOSIS — G8191 Hemiplegia, unspecified affecting right dominant side: Secondary | ICD-10-CM | POA: Diagnosis present

## 2015-01-19 DIAGNOSIS — R200 Anesthesia of skin: Secondary | ICD-10-CM

## 2015-01-19 DIAGNOSIS — E10319 Type 1 diabetes mellitus with unspecified diabetic retinopathy without macular edema: Secondary | ICD-10-CM | POA: Diagnosis present

## 2015-01-19 DIAGNOSIS — E108 Type 1 diabetes mellitus with unspecified complications: Secondary | ICD-10-CM | POA: Diagnosis not present

## 2015-01-19 DIAGNOSIS — I639 Cerebral infarction, unspecified: Principal | ICD-10-CM

## 2015-01-19 DIAGNOSIS — E785 Hyperlipidemia, unspecified: Secondary | ICD-10-CM | POA: Diagnosis present

## 2015-01-19 DIAGNOSIS — Z8249 Family history of ischemic heart disease and other diseases of the circulatory system: Secondary | ICD-10-CM | POA: Diagnosis not present

## 2015-01-19 DIAGNOSIS — Z823 Family history of stroke: Secondary | ICD-10-CM | POA: Diagnosis not present

## 2015-01-19 DIAGNOSIS — Z79899 Other long term (current) drug therapy: Secondary | ICD-10-CM | POA: Diagnosis not present

## 2015-01-19 DIAGNOSIS — I10 Essential (primary) hypertension: Secondary | ICD-10-CM | POA: Diagnosis present

## 2015-01-19 DIAGNOSIS — E109 Type 1 diabetes mellitus without complications: Secondary | ICD-10-CM | POA: Diagnosis present

## 2015-01-19 DIAGNOSIS — I6302 Cerebral infarction due to thrombosis of basilar artery: Secondary | ICD-10-CM | POA: Diagnosis not present

## 2015-01-19 LAB — URINALYSIS, ROUTINE W REFLEX MICROSCOPIC
Bilirubin Urine: NEGATIVE
HGB URINE DIPSTICK: NEGATIVE
KETONES UR: 15 mg/dL — AB
Leukocytes, UA: NEGATIVE
Nitrite: NEGATIVE
PROTEIN: 30 mg/dL — AB
Specific Gravity, Urine: 1.016 (ref 1.005–1.030)
Urobilinogen, UA: 1 mg/dL (ref 0.0–1.0)
pH: 5 (ref 5.0–8.0)

## 2015-01-19 LAB — CBC
HCT: 38 % — ABNORMAL LOW (ref 39.0–52.0)
HEMOGLOBIN: 13.4 g/dL (ref 13.0–17.0)
MCH: 30.5 pg (ref 26.0–34.0)
MCHC: 35.3 g/dL (ref 30.0–36.0)
MCV: 86.4 fL (ref 78.0–100.0)
Platelets: 141 10*3/uL — ABNORMAL LOW (ref 150–400)
RBC: 4.4 MIL/uL (ref 4.22–5.81)
RDW: 12.2 % (ref 11.5–15.5)
WBC: 4.2 10*3/uL (ref 4.0–10.5)

## 2015-01-19 LAB — DIFFERENTIAL
Basophils Absolute: 0 10*3/uL (ref 0.0–0.1)
Basophils Relative: 1 %
EOS ABS: 0.1 10*3/uL (ref 0.0–0.7)
EOS PCT: 2 %
LYMPHS ABS: 1.1 10*3/uL (ref 0.7–4.0)
Lymphocytes Relative: 26 %
Monocytes Absolute: 0.2 10*3/uL (ref 0.1–1.0)
Monocytes Relative: 6 %
NEUTROS PCT: 65 %
Neutro Abs: 2.8 10*3/uL (ref 1.7–7.7)

## 2015-01-19 LAB — PROTIME-INR
INR: 1.09 (ref 0.00–1.49)
PROTHROMBIN TIME: 14.3 s (ref 11.6–15.2)

## 2015-01-19 LAB — I-STAT CHEM 8, ED
BUN: 28 mg/dL — ABNORMAL HIGH (ref 6–20)
CALCIUM ION: 1.29 mmol/L — AB (ref 1.12–1.23)
Chloride: 103 mmol/L (ref 101–111)
Creatinine, Ser: 1.6 mg/dL — ABNORMAL HIGH (ref 0.61–1.24)
Glucose, Bld: 193 mg/dL — ABNORMAL HIGH (ref 65–99)
HCT: 39 % (ref 39.0–52.0)
Hemoglobin: 13.3 g/dL (ref 13.0–17.0)
Potassium: 4.5 mmol/L (ref 3.5–5.1)
SODIUM: 142 mmol/L (ref 135–145)
TCO2: 27 mmol/L (ref 0–100)

## 2015-01-19 LAB — APTT: aPTT: 28 seconds (ref 24–37)

## 2015-01-19 LAB — COMPREHENSIVE METABOLIC PANEL
ALBUMIN: 4 g/dL (ref 3.5–5.0)
ALT: 29 U/L (ref 17–63)
ANION GAP: 7 (ref 5–15)
AST: 21 U/L (ref 15–41)
Alkaline Phosphatase: 73 U/L (ref 38–126)
BILIRUBIN TOTAL: 0.9 mg/dL (ref 0.3–1.2)
BUN: 24 mg/dL — ABNORMAL HIGH (ref 6–20)
CO2: 28 mmol/L (ref 22–32)
Calcium: 9.7 mg/dL (ref 8.9–10.3)
Chloride: 107 mmol/L (ref 101–111)
Creatinine, Ser: 1.6 mg/dL — ABNORMAL HIGH (ref 0.61–1.24)
GFR calc Af Amer: 57 mL/min — ABNORMAL LOW (ref >60)
GFR, EST NON AFRICAN AMERICAN: 49 mL/min — AB (ref >60)
Glucose, Bld: 192 mg/dL — ABNORMAL HIGH (ref 65–99)
POTASSIUM: 4.6 mmol/L (ref 3.5–5.1)
Sodium: 142 mmol/L (ref 135–145)
TOTAL PROTEIN: 6.5 g/dL (ref 6.5–8.1)

## 2015-01-19 LAB — CBG MONITORING, ED
GLUCOSE-CAPILLARY: 63 mg/dL — AB (ref 65–99)
GLUCOSE-CAPILLARY: 116 mg/dL — AB (ref 65–99)

## 2015-01-19 LAB — URINE MICROSCOPIC-ADD ON

## 2015-01-19 LAB — I-STAT TROPONIN, ED: TROPONIN I, POC: 0 ng/mL (ref 0.00–0.08)

## 2015-01-19 MED ORDER — SODIUM CHLORIDE 0.9 % IJ SOLN
3.0000 mL | Freq: Two times a day (BID) | INTRAMUSCULAR | Status: DC
  Administered 2015-01-19 – 2015-01-20 (×2): 3 mL via INTRAVENOUS

## 2015-01-19 MED ORDER — SENNOSIDES-DOCUSATE SODIUM 8.6-50 MG PO TABS: 1.0000 | ORAL_TABLET | Freq: Every evening | ORAL | Status: DC | PRN

## 2015-01-19 MED ORDER — ONDANSETRON HCL 4 MG PO TABS: 4.0000 mg | ORAL_TABLET | Freq: Four times a day (QID) | ORAL | Status: DC | PRN

## 2015-01-19 MED ORDER — SIMVASTATIN 40 MG PO TABS
40.0000 mg | ORAL_TABLET | Freq: Every day | ORAL | Status: DC
  Administered 2015-01-20: 40 mg via ORAL
  Filled 2015-01-19: qty 1

## 2015-01-19 MED ORDER — ONDANSETRON HCL 4 MG/2ML IJ SOLN: 4.0000 mg | Freq: Four times a day (QID) | INTRAMUSCULAR | Status: DC | PRN

## 2015-01-19 MED ORDER — STROKE: EARLY STAGES OF RECOVERY BOOK
Freq: Once | Status: AC
  Administered 2015-01-19: 21:00:00
  Filled 2015-01-19: qty 1

## 2015-01-19 MED ORDER — INSULIN PUMP
0.0000 | SUBCUTANEOUS | Status: DC
  Filled 2015-01-19: qty 1

## 2015-01-19 MED ORDER — LEVOTHYROXINE SODIUM 50 MCG PO TABS
50.0000 ug | ORAL_TABLET | Freq: Every day | ORAL | Status: DC
  Administered 2015-01-20: 50 ug via ORAL
  Filled 2015-01-19: qty 1

## 2015-01-19 MED ORDER — LORAZEPAM 2 MG/ML IJ SOLN
1.0000 mg | Freq: Once | INTRAMUSCULAR | Status: AC
  Administered 2015-01-19: 1 mg via INTRAVENOUS
  Filled 2015-01-19: qty 1

## 2015-01-19 MED ORDER — CARVEDILOL 12.5 MG PO TABS
25.0000 mg | ORAL_TABLET | Freq: Two times a day (BID) | ORAL | Status: DC
  Administered 2015-01-20: 25 mg via ORAL
  Filled 2015-01-19: qty 2

## 2015-01-19 MED ORDER — FAMOTIDINE 20 MG PO TABS
20.0000 mg | ORAL_TABLET | Freq: Two times a day (BID) | ORAL | Status: DC
  Administered 2015-01-19 – 2015-01-20 (×2): 20 mg via ORAL
  Filled 2015-01-19 (×2): qty 1

## 2015-01-19 MED ORDER — ATORVASTATIN CALCIUM 10 MG PO TABS: 20.0000 mg | ORAL_TABLET | Freq: Every day | ORAL | Status: DC

## 2015-01-19 MED ORDER — ENOXAPARIN SODIUM 40 MG/0.4ML ~~LOC~~ SOLN
40.0000 mg | SUBCUTANEOUS | Status: DC
  Administered 2015-01-19: 40 mg via SUBCUTANEOUS
  Filled 2015-01-19: qty 0.4

## 2015-01-19 MED ORDER — LISINOPRIL 2.5 MG PO TABS
2.5000 mg | ORAL_TABLET | Freq: Every day | ORAL | Status: DC
Start: 2015-01-20 — End: 2015-01-20
  Administered 2015-01-20: 2.5 mg via ORAL
  Filled 2015-01-19: qty 1

## 2015-01-19 MED ORDER — CLOPIDOGREL BISULFATE 75 MG PO TABS
75.0000 mg | ORAL_TABLET | Freq: Every day | ORAL | Status: DC
  Administered 2015-01-19 – 2015-01-20 (×2): 75 mg via ORAL
  Filled 2015-01-19 (×2): qty 1

## 2015-01-19 MED ORDER — ASPIRIN EC 325 MG PO TBEC
325.0000 mg | DELAYED_RELEASE_TABLET | Freq: Every day | ORAL | Status: DC
Start: 2015-01-20 — End: 2015-01-20
  Administered 2015-01-20: 325 mg via ORAL
  Filled 2015-01-19: qty 1

## 2015-01-19 MED ORDER — AMLODIPINE BESYLATE 10 MG PO TABS
10.0000 mg | ORAL_TABLET | Freq: Every day | ORAL | Status: DC
  Administered 2015-01-20: 10 mg via ORAL
  Filled 2015-01-19: qty 1

## 2015-01-19 NOTE — Progress Notes (Signed)
Called report once, will try again. Joaquin Bend E, RN 01/19/2015 8:30 PM

## 2015-01-19 NOTE — Consult Note (Signed)
Referring Physician: Dr Mingo Amber    Chief Complaint: right sided paresthesias, imbalance, acute left pontine infarct on MRI  HPI:                                                                                                                                         Adrian Neal is an 49 y.o. 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 Wife is at the bedside. Patient was seen in stroke follow up by [name] 7/16. Patient expressed that 2 days ago he woke up with a numb/tingly sensation involving his right face-arm-leg as well as " some clumsiness while walking, like a drunk person" and spinning sensation. He denies associated HA, double vision, difficulty swallowing, slurred speech, language or vision impairment. Although he thought he was likely having another stroke, he did not see immediate medical attention thinking that his symptoms will eventually dissipate. He went to see his physician today who advised him to come to the ED for stroke evaluation. MRI brain tonight was personally reviewed and showed an acute left pontine infarct. [name] said that he takes his medications for secondary stroke prevention (aspirin 325 mg, simvastatin 40 mg, lisinopril, and insulin) faithfully. Overall, he is not noticing worsening of his symptoms at this time.  Date last known well: 01/16/15 Time last known well: 10 pm tPA Given: no, late presentation   Past Medical History  Diagnosis Date  . Hypertension   . Diabetes mellitus without complication     diagnosed at age 72  . 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 Stroke     Past Surgical History  Procedure Laterality Date  . Eye surgery  1990    for retinopathy   . Cataract extraction w/ intraocular lens implant  1994    Family History  Problem  Relation Age of Onset  . Stroke Mother   . Hypertension Mother   . Hyperlipidemia Mother   . Hyperlipidemia Father   . Hypertension Father   . Diabetes Father    Social History:  reports that he quit smoking about 23 years ago. He does not have any smokeless tobacco history on file. He reports that he does not drink alcohol or use illicit drugs.  Allergies: No Known Allergies  Medications:  I have reviewed the patient's current medications.  ROS:                                                                                                                                       History obtained from chart review and the patient  General ROS: negative for - chills, fatigue, fever, night sweats, weight gain or weight loss Psychological ROS: negative for - behavioral disorder, hallucinations, memory difficulties, mood swings or suicidal ideation Ophthalmic ROS: negative for - blurry vision, double vision, eye pain or loss of vision ENT ROS: negative for - epistaxis, nasal discharge, oral lesions, sore throat, tinnitus or vertigo Allergy and Immunology ROS: negative for - hives or itchy/watery eyes Hematological and Lymphatic ROS: negative for - bleeding problems, bruising or swollen lymph nodes Endocrine ROS: negative for - galactorrhea, hair pattern changes, polydipsia/polyuria or temperature intolerance Respiratory ROS: negative for - cough, hemoptysis, shortness of breath or wheezing Cardiovascular ROS: negative for - chest pain, dyspnea on exertion, edema or irregular heartbeat Gastrointestinal ROS: negative for - abdominal pain, diarrhea, hematemesis, nausea/vomiting or stool incontinence Genito-Urinary ROS: negative for - dysuria, hematuria, incontinence or urinary frequency/urgency Musculoskeletal ROS: negative for - joint swelling or muscular  weakness Neurological ROS: as noted in HPI Dermatological ROS: negative for rash and skin lesion changes  Physical exam:  Constitutional: well developed, pleasant male in no apparent distress. Blood pressure 155/74, pulse 97, temperature 98.1 F (36.7 C), temperature source Oral, resp. rate 22, SpO2 98 %. Eyes: no jaundice or exophthalmos.  Head: normocephalic. Neck: supple, no bruits, no JVD. Cardiac: no murmurs. Lungs: clear. Abdomen: soft, no tender, no mass. Extremities: no edema, clubbing, or cyanosis.  Skin: no rash  Neurologic Examination:                                                                                                      General: NAD Mental Status: Alert, oriented, thought content appropriate.  Speech fluent without evidence of aphasia.  Able to follow 3 step commands without difficulty. Cranial Nerves: II: Discs flat bilaterally; Visual fields grossly normal, pupils equal, round, reactive to light and accommodation III,IV, VI: ptosis not present, extra-ocular motions intact bilaterally V,VII: smile symmetric, facial light touch sensation normal bilaterally VIII: hearing normal bilaterally IX,X: uvula rises symmetrically XI: bilateral shoulder shrug XII: midline tongue extension without atrophy or fasciculations Motor: Right : Upper extremity   5/5    Left:     Upper extremity   5/5  Lower extremity   5/5     Lower extremity   5/5 Tone and bulk:normal tone throughout; no atrophy noted Sensory: Pinprick slightly diminished left arm. Deep Tendon Reflexes:  Right: Upper Extremity   Left: Upper extremity   biceps (C-5 to C-6) 2/4   biceps (C-5 to C-6) 2/4 tricep (C7) 2/4    triceps (C7) 2/4 Brachioradialis (C6) 2/4  Brachioradialis (C6) 2/4  Lower Extremity Lower Extremity  quadriceps (L-2 to L-4) 2/4   quadriceps (L-2 to L-4) 2/4 Achilles (S1) 2/4   Achilles (S1) 2/4  Plantars: Right: downgoing   Left: downgoing Cerebellar: Significant for mild  dysmetria on the left FTN,  normal heel-to-shin test Gait:  No tested due to multiple leads, safety reasons    Results for orders placed or performed during the hospital encounter of 01/19/15 (from the past 48 hour(s))  Protime-INR     Status: None   Collection Time: 01/19/15  1:48 PM  Result Value Ref Range   Prothrombin Time 14.3 11.6 - 15.2 seconds   INR 1.09 0.00 - 1.49  APTT     Status: None   Collection Time: 01/19/15  1:48 PM  Result Value Ref Range   aPTT 28 24 - 37 seconds  CBC     Status: Abnormal   Collection Time: 01/19/15  1:48 PM  Result Value Ref Range   WBC 4.2 4.0 - 10.5 K/uL   RBC 4.40 4.22 - 5.81 MIL/uL   Hemoglobin 13.4 13.0 - 17.0 g/dL   HCT 38.0 (L) 39.0 - 52.0 %   MCV 86.4 78.0 - 100.0 fL   MCH 30.5 26.0 - 34.0 pg   MCHC 35.3 30.0 - 36.0 g/dL   RDW 12.2 11.5 - 15.5 %   Platelets 141 (L) 150 - 400 K/uL  Differential     Status: None   Collection Time: 01/19/15  1:48 PM  Result Value Ref Range   Neutrophils Relative % 65 %   Neutro Abs 2.8 1.7 - 7.7 K/uL   Lymphocytes Relative 26 %   Lymphs Abs 1.1 0.7 - 4.0 K/uL   Monocytes Relative 6 %   Monocytes Absolute 0.2 0.1 - 1.0 K/uL   Eosinophils Relative 2 %   Eosinophils Absolute 0.1 0.0 - 0.7 K/uL   Basophils Relative 1 %   Basophils Absolute 0.0 0.0 - 0.1 K/uL  Comprehensive metabolic panel     Status: Abnormal   Collection Time: 01/19/15  1:48 PM  Result Value Ref Range   Sodium 142 135 - 145 mmol/L   Potassium 4.6 3.5 - 5.1 mmol/L   Chloride 107 101 - 111 mmol/L   CO2 28 22 - 32 mmol/L   Glucose, Bld 192 (H) 65 - 99 mg/dL   BUN 24 (H) 6 - 20 mg/dL   Creatinine, Ser 1.60 (H) 0.61 - 1.24 mg/dL   Calcium 9.7 8.9 - 10.3 mg/dL   Total Protein 6.5 6.5 - 8.1 g/dL   Albumin 4.0 3.5 - 5.0 g/dL   AST 21 15 - 41 U/L   ALT 29 17 - 63 U/L   Alkaline Phosphatase 73 38 - 126 U/L   Total Bilirubin 0.9 0.3 - 1.2 mg/dL   GFR calc non Af Amer 49 (L) >60 mL/min   GFR calc Af Amer 57 (L) >60 mL/min     Comment: (NOTE) The eGFR has been calculated using the CKD EPI equation. This calculation has not been validated in all clinical situations. eGFR's persistently <60 mL/min  signify possible Chronic Kidney Disease.    Anion gap 7 5 - 15  I-stat troponin, ED (not at [facility], [facility])     Status: None   Collection Time: 01/19/15  2:09 PM  Result Value Ref Range   Troponin i, poc 0.00 0.00 - 0.08 ng/mL   Comment 3            Comment: Due to the release kinetics of cTnI, a negative result within the first hours of the onset of symptoms does not rule out myocardial infarction with certainty. If myocardial infarction is still suspected, repeat the test at appropriate intervals.   I-Stat Chem 8, ED  (not at [facility], [facility])     Status: Abnormal   Collection Time: 01/19/15  2:10 PM  Result Value Ref Range   Sodium 142 135 - 145 mmol/L   Potassium 4.5 3.5 - 5.1 mmol/L   Chloride 103 101 - 111 mmol/L   BUN 28 (H) 6 - 20 mg/dL   Creatinine, Ser 1.60 (H) 0.61 - 1.24 mg/dL   Glucose, Bld 193 (H) 65 - 99 mg/dL   Calcium, Ion 1.29 (H) 1.12 - 1.23 mmol/L   TCO2 27 0 - 100 mmol/L   Hemoglobin 13.3 13.0 - 17.0 g/dL   HCT 39.0 39.0 - 52.0 %  CBG monitoring, ED     Status: Abnormal   Collection Time: 01/19/15  3:09 PM  Result Value Ref Range   Glucose-Capillary 63 (L) 65 - 99 mg/dL  CBG monitoring, ED     Status: Abnormal   Collection Time: 01/19/15  3:45 PM  Result Value Ref Range   Glucose-Capillary 116 (H) 65 - 99 mg/dL   Dg Chest 2 View  01/19/2015   CLINICAL DATA:  Right-sided numbness since Tuesday. Nausea and vomiting.  EXAM: CHEST  2 VIEW  COMPARISON:  07/28/2014  FINDINGS: The cardiac silhouette, mediastinal and hilar contours are within normal limits and stable. Mild bronchitic type lung changes but no acute pulmonary findings. No pleural effusion. The bony thorax is intact.  IMPRESSION: Mild chronic bronchitic type lung changes but no infiltrates or effusions.   Electronically Signed   By: Marijo Sanes M.D.   On: 01/19/2015 18:06   Ct Head Wo Contrast  01/19/2015   CLINICAL DATA:  Right-sided numbness for the past 2 days. Left-sided symptoms since a CVA in March 2016.  EXAM: CT HEAD WITHOUT CONTRAST  TECHNIQUE: Contiguous axial images were obtained from the base of the skull through the vertex without intravenous contrast.  COMPARISON:  Brain MR dated 07/28/2014.  FINDINGS: Tiny area of low density in the pons on the left, near the previously demonstrated acute left mesial brachium pontis infarct. Otherwise, normal appearing cerebral hemispheres and posterior fossa structures. Normal size and position of the ventricles. No intracranial hemorrhage, mass lesion or CT evidence of acute infarction. Unremarkable bones. Mild bilateral ethmoid and frontal sinus mucosal thickening.  IMPRESSION: 1. No acute abnormality. 2. Old, tiny left pons lacunar infarct. 3. 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   Electronically Signed   By: Claudie Revering M.D.   On: 01/19/2015 14:01   Mr Brain Wo Contrast  01/19/2015   CLINICAL DATA:  Diffuse right-sided body numbness beginning 2 days ago.  EXAM: MRI HEAD WITHOUT CONTRAST  TECHNIQUE: Multiplanar, multiecho pulse sequences of the brain and surrounding structures were obtained without intravenous contrast.  COMPARISON:  Head CT 01/19/2015 and MRI 07/28/2014  FINDINGS: There is a 5 mm acute infarct in  the left pons. A subcentimeter chronic infarct is present in the medial aspect of the left middle cerebellar peduncle with associated chronic blood products, acute on the prior MRI. There is no evidence of mass, midline shift, or extra-axial fluid collection. Ventricles and sulci are within normal limits. No significant supratentorial white matter disease is seen.  Prior right cataract extraction is noted. No significant inflammatory disease is seen in the paranasal sinuses or mastoid air cells. Major intracranial vascular flow voids are preserved.  IMPRESSION:  1. Acute 5 mm left pontine infarct. 2. Chronic left middle cerebellar peduncle infarct.   Electronically Signed   By: Logan Bores M.D.   On: 01/19/2015 19:11    Assessment: 49 y.o. male presents with new onset paresthesias right hemibody and slight balance worsening, and MRI brain revealing an acute left pontine infarct, most likely resulting from small vessel disease perforator branches BA. Patient out of the window for thrombolysis. Admit for observation tonight. Patient had comprehensive stroke work up 6 months ago and this new stroke likely due to small vessel disease, thus at this time will defer further neuro-imaging, ECHO, CUS. Nonetheless, will recommend checking hemoglobin A1C and lipid profile. Plavix. PT Stroke team will follow up tomorrow.  Stroke Risk Factors -  HTN, hyperlipidemia, DM,  Dorian Pod, MD [facility] [phone]  01/19/2015, 8:03 PM

## 2015-01-19 NOTE — Progress Notes (Signed)
Clinically equivalent dose of Lipitor has been substituted for Zocor per [place] P&T committee policy due to  patients on amlodipine and simvastatin >20mg /day have reported cases of rhabdomyolysis. Patient currently on Amlodipine and Simvastatin 40mg  daily.   Pharmacy has substituted atorvastatin (Lipitor) 20mg  for simvastatin 40mg  per P&T committee policy.   Nicole Cella, RPh Clinical Pharmacist Pager: [phone] 01/19/2015 10:32 PM

## 2015-01-19 NOTE — ED Provider Notes (Signed)
CSN: [id number]     Arrival date & time 01/19/15  1303 History   First MD Initiated Contact with Patient 01/19/15 1622     Chief Complaint  Patient presents with  . Stroke Symptoms     (Consider location/radiation/quality/duration/timing/severity/associated sxs/prior Treatment) HPI Comments: Awoke with right-sided tingling and numbness 2 days ago. Has not alleviated but will wax and wane. No alleviating or exacerbating factors. No fever, cough, recent illness. Recent stroke 6 months ago. He was in his brainstem and he has mild chronic dizziness from that. He did have vomiting 2 days ago. He never had any numbness or tingling with his prior stroke. He was sent from Dr. Eugenio Hoes office for a stroke evaluation.  Patient is a 49 y.o. male presenting with neurologic complaint. The history is provided by the patient.  Neurologic Problem This is a new problem. The current episode started 2 days ago. The problem occurs constantly. The problem has not changed since onset.Pertinent negatives include no abdominal pain and no shortness of breath. Nothing aggravates the symptoms. Nothing relieves the symptoms.    Past Medical History  Diagnosis Date  . Hypertension   . Diabetes mellitus without complication     diagnosed at age 28  . 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 Stroke    Past Surgical History  Procedure Laterality Date  . Eye surgery  1990    for retinopathy   . Cataract extraction w/ intraocular lens implant  1994   Family History  Problem Relation Age of Onset  . Stroke Mother   . Hypertension Mother   . Hyperlipidemia Mother   . Hyperlipidemia Father   . Hypertension Father   . Diabetes Father    Social History  Substance Use Topics  . Smoking status: Former Smoker    Quit date: 04/30/1991  . Smokeless tobacco: None  . Alcohol Use: No    Review of Systems  Constitutional: Negative for fever and chills.  Respiratory: Negative for shortness of  breath.   Gastrointestinal: Positive for nausea and vomiting (twice, 2 days ago). Negative for abdominal pain.  Neurological: Positive for dizziness (chronic).  All other systems reviewed and are negative.     Allergies  Review of patient's allergies indicates no known allergies.  Home Medications   Prior to Admission medications   Medication Sig Start Date End Date Taking? Authorizing Provider  amLODipine (NORVASC) 10 MG tablet Take 1 tablet (10 mg total) by mouth daily. 08/03/14   Domenic Polite, MD  aspirin EC 325 MG EC tablet Take 1 tablet (325 mg total) by mouth daily. 08/03/14   Domenic Polite, MD  carvedilol (COREG) 25 MG tablet Take 1 tablet (25 mg total) by mouth 2 (two) times daily with a meal. 08/12/14   Ivan Anchors Love, PA-C  Insulin Human (INSULIN PUMP) SOLN Inject 0-2 each into the skin every 4 (four) hours. 08/03/14   Domenic Polite, MD  lisinopril (PRINIVIL,ZESTRIL) 2.5 MG tablet TAKE ONE TABLET BY MOUTH ONCE DAILY 10/17/14   Meredith Staggers, MD  meclizine (ANTIVERT) 25 MG tablet Take 1 tablet (25 mg total) by mouth 3 (three) times daily as needed for dizziness. Patient not taking: Reported on 11/22/2014 08/12/14   Ivan Anchors Love, PA-C  pantoprazole (PROTONIX) 40 MG tablet TAKE ONE TABLET BY MOUTH ONCE DAILY Patient not taking: Reported on 11/22/2014 10/17/14   Meredith Staggers, MD  simvastatin (ZOCOR) 40 MG tablet Take 40 mg by mouth daily.  Historical Provider, MD   BP 156/72 mmHg  Pulse 79  Temp(Src) 98.1 F (36.7 C) (Oral)  Resp 11  SpO2 100% Physical Exam  Constitutional: He is oriented to person, place, and time. He appears well-developed and well-nourished. No distress.  HENT:  Head: Normocephalic and atraumatic.  Mouth/Throat: No oropharyngeal exudate.  Eyes: EOM are normal. Pupils are equal, round, and reactive to light.  Neck: Normal range of motion. Neck supple.  Cardiovascular: Normal rate and regular rhythm.  Exam reveals no friction rub.   No murmur  heard. Pulmonary/Chest: Effort normal and breath sounds normal. No respiratory distress. He has no wheezes. He has no rales.  Abdominal: He exhibits no distension. There is no tenderness. There is no rebound.  Musculoskeletal: Normal range of motion. He exhibits no edema.  Neurological: He is alert and oriented to person, place, and time. No cranial nerve deficit. He exhibits normal muscle tone. Coordination (Mild dysmetria on L arm, states it's chronic) abnormal.  Skin: He is not diaphoretic.  Nursing note and vitals reviewed.   ED Course  Procedures (including critical care time) Labs Review Labs Reviewed  CBC - Abnormal; Notable for the following:    HCT 38.0 (*)    Platelets 141 (*)    All other components within normal limits  COMPREHENSIVE METABOLIC PANEL - Abnormal; Notable for the following:    Glucose, Bld 192 (*)    BUN 24 (*)    Creatinine, Ser 1.60 (*)    GFR calc non Af Amer 49 (*)    GFR calc Af Amer 57 (*)    All other components within normal limits  CBG MONITORING, ED - Abnormal; Notable for the following:    Glucose-Capillary 63 (*)    All other components within normal limits  I-STAT CHEM 8, ED - Abnormal; Notable for the following:    BUN 28 (*)    Creatinine, Ser 1.60 (*)    Glucose, Bld 193 (*)    Calcium, Ion 1.29 (*)    All other components within normal limits  CBG MONITORING, ED - Abnormal; Notable for the following:    Glucose-Capillary 116 (*)    All other components within normal limits  PROTIME-INR  APTT  DIFFERENTIAL  URINALYSIS, ROUTINE W REFLEX MICROSCOPIC (NOT AT [facility])  I-STAT TROPOININ, ED    Imaging Review Ct Head Wo Contrast  01/19/2015   CLINICAL DATA:  Right-sided numbness for the past 2 days. Left-sided symptoms since a CVA in March 2016.  EXAM: CT HEAD WITHOUT CONTRAST  TECHNIQUE: Contiguous axial images were obtained from the base of the skull through the vertex without intravenous contrast.  COMPARISON:  Brain MR dated  07/28/2014.  FINDINGS: Tiny area of low density in the pons on the left, near the previously demonstrated acute left mesial brachium pontis infarct. Otherwise, normal appearing cerebral hemispheres and posterior fossa structures. Normal size and position of the ventricles. No intracranial hemorrhage, mass lesion or CT evidence of acute infarction. Unremarkable bones. Mild bilateral ethmoid and frontal sinus mucosal thickening.  IMPRESSION: 1. No acute abnormality. 2. Old, tiny left pons lacunar infarct. 3. 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   Electronically Signed   By: Claudie Revering M.D.   On: 01/19/2015 14:01   I have personally reviewed and evaluated these images and lab results as part of my medical decision-making.   EKG Interpretation   Date/Time:  Thursday January 19 2015 13:16:29 EDT Ventricular Rate:  74 PR Interval:  134 QRS  Duration: 84 QT Interval:  372 QTC Calculation: 412 R Axis:   43 Text Interpretation:  Normal sinus rhythm Normal ECG NO SIGNIFICANT CHANGE  SINCE LAST TRACING YESTERDAY Confirmed by Mingo Amber  MD, BLAIR ([id number]) on  01/19/2015 5:39:10 PM      MDM   Final diagnoses:  Stroke  Numbness on right side    49 year old male here with R sided numbness/tingling for 2 days. Hx of stroke 6 months ago, was brain stem, had dizziness/vomiting at that time, no numbness at that time. Patient denies recent illness, cough, fever, vomiting, diarrhea, dysuria. Patient here with stable vitals. He reports mild altered light touch on R side diffusely, however with say it feels the same at times. He also reports more difficulty walking, hx of prior difficulty walking due to his stroke, uses a cane. Reports more difficulty with ambulation over these past few days. Labs normal here. Head CT ok. Will obtain MRI and urine.  MR shows L pontine stroke. Neuro evaluated, recommended admission.  Evelina Bucy, MD 01/19/15 2017

## 2015-01-19 NOTE — Progress Notes (Signed)
Pt arrived from ED via tech with VSS and reporting no pain. Will orient to room and equipment and will continue to monitor. Joaquin Bend E, [place] 01/19/2015 2100

## 2015-01-19 NOTE — ED Notes (Signed)
Neuro at bedside.

## 2015-01-19 NOTE — ED Notes (Signed)
Admitting at bedside 

## 2015-01-19 NOTE — ED Notes (Signed)
Pt woke up with right sided numbness on Tuesday, hx of recent CVA-- h/a with "hearing heart beat in head" last night . Still numb and tingly from cheek to toes.

## 2015-01-19 NOTE — H&P (Signed)
[facility] History and Physical  Adrian Neal [id number] DOB: August 05, 1965 DOA: 01/19/2015  Referring physician: Evelina Bucy, MD PCP: Dwan Bolt, MD   Chief Complaint: Stroke Symptoms  HPI: Adrian Neal is a 49 y.o. male with a past medical history of CVA earlier this year, hyperlipidemia, hypertension, type 1 diabetes, diabetic retinopathy who comes to the emergency department with complaints of right-sided face and extremities numbness and tingling which started when he woke up about 2 days ago. He denies dysarthria, dizziness, nausea, headache or visual disturbances, but complains of having poor coordination while ambulating. He states that he felt like he was drunk. Although he had his symptoms, he did not sick medical attention until today because he thought that his symptoms were going to resolve his spontaneously.   MRI done in the ER show an acute left pontine infarct. He states that his symptoms are better now, but continues to have right-sided paresthesias. He is in no acute distress.  Review of Systems:  Constitutional:  No weight loss, night sweats, Fevers, chills, fatigue.  HEENT:  No headaches, Difficulty swallowing,Tooth/dental problems,Sore throat,  No sneezing, itching, ear ache, nasal congestion, post nasal drip,  Cardio-vascular:  No chest pain, Orthopnea, PND, swelling in lower extremities, anasarca, dizziness, palpitations  GI:  No heartburn, indigestion, abdominal pain, nausea, vomiting, diarrhea, change in bowel habits, loss of appetite  Resp:  No shortness of breath with exertion or at rest. No excess mucus, no productive cough, No non-productive cough, No coughing up of blood.No change in color of mucus.No wheezing.No chest wall deformity  Skin:  no rash or lesions.  GU:  no dysuria, change in color of urine, no urgency or frequency. No flank pain.  Musculoskeletal:  No joint pain or swelling. No decreased range of motion. No back  pain.  Psych:  No change in mood or affect. No depression or anxiety. No memory loss.  Neuro: As above mentioned.  Past Medical History  Diagnosis Date  . Hypertension   . Diabetes mellitus without complication     diagnosed at age 30  . 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 Stroke    Past Surgical History  Procedure Laterality Date  . Eye surgery  1990    for retinopathy   . Cataract extraction w/ intraocular lens implant  1994   Social History:  reports that he quit smoking about 23 years ago. He does not have any smokeless tobacco history on file. He reports that he does not drink alcohol or use illicit drugs.  No Known Allergies  Family History  Problem Relation Age of Onset  . Stroke Mother   . Hypertension Mother   . Hyperlipidemia Mother   . Hyperlipidemia Father   . Hypertension Father   . Diabetes Father     Prior to Admission medications   Medication Sig Start Date End Date Taking? Authorizing Provider  amLODipine (NORVASC) 10 MG tablet Take 1 tablet (10 mg total) by mouth daily. 08/03/14  Yes Domenic Polite, MD  aspirin EC 325 MG EC tablet Take 1 tablet (325 mg total) by mouth daily. 08/03/14  Yes Domenic Polite, MD  carvedilol (COREG) 25 MG tablet Take 1 tablet (25 mg total) by mouth 2 (two) times daily with a meal. 08/12/14  Yes Ivan Anchors Love, PA-C  Insulin Human (INSULIN PUMP) SOLN Inject 0-2 each into the skin every 4 (four) hours. 08/03/14  Yes Domenic Polite, MD  levothyroxine (SYNTHROID, [place])  50 MCG tablet Take 50 mcg by mouth daily before breakfast.   Yes Historical Provider, MD  lisinopril (PRINIVIL,ZESTRIL) 2.5 MG tablet TAKE ONE TABLET BY MOUTH ONCE DAILY 10/17/14  Yes Meredith Staggers, MD  simvastatin (ZOCOR) 40 MG tablet Take 40 mg by mouth daily.   Yes Historical Provider, MD  meclizine (ANTIVERT) 25 MG tablet Take 1 tablet (25 mg total) by mouth 3 (three) times daily as needed for dizziness. Patient not taking: Reported on  11/22/2014 08/12/14   Ivan Anchors Love, PA-C  pantoprazole (PROTONIX) 40 MG tablet TAKE ONE TABLET BY MOUTH ONCE DAILY Patient not taking: Reported on 11/22/2014 10/17/14   Meredith Staggers, MD   Physical Exam: Filed Vitals:   01/19/15 1941 01/19/15 1945 01/19/15 2000 01/19/15 2103  BP:  155/74 110/73 164/62  Pulse: 96 97 94 92  Temp:   98.1 F (36.7 C) 98.2 F (36.8 C)  TempSrc:    Oral  Resp: 13 22 19 18   Height:    5\' 8"  (1.727 m)  Weight:    78.109 kg (172 lb 3.2 oz)  SpO2: 98% 98% 98% 98%    Wt Readings from Last 3 Encounters:  01/19/15 78.109 kg (172 lb 3.2 oz)  11/22/14 80.559 kg (177 lb 9.6 oz)  08/10/14 77.701 kg (171 lb 4.8 oz)    General:  Appears calm and comfortable Eyes: PERRL, normal lids, irises & conjunctiva ENT: grossly normal hearing, lips & tongue Neck: no LAD, masses or thyromegaly Cardiovascular: RRR, no m/r/g. No LE edema. Telemetry: SR, no arrhythmias  Respiratory: CTA bilaterally, no w/r/r. Normal respiratory effort. Abdomen: soft, ntnd Skin: no rash or induration seen on limited exam Musculoskeletal: grossly normal tone BUE/BLE Psychiatric: grossly normal mood and affect, speech fluent and appropriate Neurologic: Significant for mild dysmetria on the left FTN, normal heel-to-shin test per neurology.           Labs on Admission:  Basic Metabolic Panel:  Recent Labs Lab 01/19/15 1348 01/19/15 1410  NA 142 142  K 4.6 4.5  CL 107 103  CO2 28  --   GLUCOSE 192* 193*  BUN 24* 28*  CREATININE 1.60* 1.60*  CALCIUM 9.7  --    Liver Function Tests:  Recent Labs Lab 01/19/15 1348  AST 21  ALT 29  ALKPHOS 73  BILITOT 0.9  PROT 6.5  ALBUMIN 4.0   CBC:  Recent Labs Lab 01/19/15 1348 01/19/15 1410  WBC 4.2  --   NEUTROABS 2.8  --   HGB 13.4 13.3  HCT 38.0* 39.0  MCV 86.4  --   PLT 141*  --    CBG:  Recent Labs Lab 01/19/15 1509 01/19/15 1545  GLUCAP 63* 116*    Radiological Exams on Admission: Dg Chest 2 View  01/19/2015    CLINICAL DATA:  Right-sided numbness since Tuesday. Nausea and vomiting.  EXAM: CHEST  2 VIEW  COMPARISON:  07/28/2014  FINDINGS: The cardiac silhouette, mediastinal and hilar contours are within normal limits and stable. Mild bronchitic type lung changes but no acute pulmonary findings. No pleural effusion. The bony thorax is intact.  IMPRESSION: Mild chronic bronchitic type lung changes but no infiltrates or effusions.   Electronically Signed   By: Marijo Sanes M.D.   On: 01/19/2015 18:06   Ct Head Wo Contrast  01/19/2015   CLINICAL DATA:  Right-sided numbness for the past 2 days. Left-sided symptoms since a CVA in March 2016.  EXAM: CT HEAD WITHOUT CONTRAST  TECHNIQUE:  Contiguous axial images were obtained from the base of the skull through the vertex without intravenous contrast.  COMPARISON:  Brain MR dated 07/28/2014.  FINDINGS: Tiny area of low density in the pons on the left, near the previously demonstrated acute left mesial brachium pontis infarct. Otherwise, normal appearing cerebral hemispheres and posterior fossa structures. Normal size and position of the ventricles. No intracranial hemorrhage, mass lesion or CT evidence of acute infarction. Unremarkable bones. Mild bilateral ethmoid and frontal sinus mucosal thickening.  IMPRESSION: 1. No acute abnormality. 2. Old, tiny left pons lacunar infarct. 3. 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   Electronically Signed   By: Claudie Revering M.D.   On: 01/19/2015 14:01   Mr Brain Wo Contrast  01/19/2015   CLINICAL DATA:  Diffuse right-sided body numbness beginning 2 days ago.  EXAM: MRI HEAD WITHOUT CONTRAST  TECHNIQUE: Multiplanar, multiecho pulse sequences of the brain and surrounding structures were obtained without intravenous contrast.  COMPARISON:  Head CT 01/19/2015 and MRI 07/28/2014  FINDINGS: There is a 5 mm acute infarct in the left pons. A subcentimeter chronic infarct is present in the medial aspect of the left middle  cerebellar peduncle with associated chronic blood products, acute on the prior MRI. There is no evidence of mass, midline shift, or extra-axial fluid collection. Ventricles and sulci are within normal limits. No significant supratentorial white matter disease is seen.  Prior right cataract extraction is noted. No significant inflammatory disease is seen in the paranasal sinuses or mastoid air cells. Major intracranial vascular flow voids are preserved.  IMPRESSION: 1. Acute 5 mm left pontine infarct. 2. Chronic left middle cerebellar peduncle infarct.   Electronically Signed   By: Logan Bores M.D.   On: 01/19/2015 19:11   EKG: Independently reviewed. Vent. rate 74 BPM PR interval 134 ms QRS duration 84 ms QT/QTc 372/412 ms P-R-T axes 72 43 50  Echocardiogram: 07/29/2014 ------------------------------------------------------------------- LV EF: 60% -  65%  ------------------------------------------------------------------- Indications:   CVA 436.  ------------------------------------------------------------------- History:  Risk factors: Former tobacco use. Hypertension. Diabetes mellitus. Dyslipidemia.  ------------------------------------------------------------------- Study Conclusions  - Left ventricle: The cavity size was normal. Wall thickness was increased in a pattern of mild LVH. Systolic function was normal. The estimated ejection fraction was in the range of 60% to 65%. Wall motion was normal; there were no regional wall motion abnormalities. Features are consistent with a pseudonormal left ventricular filling pattern, with concomitant abnormal relaxation and increased filling pressure (grade 2 diastolic dysfunction).  Assessment/Plan Principal Problem:   CVA (cerebral infarction) Acute 5 mm left pontine infarct. Chronic left middle cerebellar peduncle infarct. The patient had an echocardiogram and a carotid ultrasound earlier this year. Neurology  recommended adding Plavix to the patient's medications.  Active Problems:   Type 1 diabetes mellitus Continue using insulin pump as usual. Follow-up hemoglobin A1c level.    Hyperlipidemia Continue statin and monitor LFTs.    Essential hypertension On lisinopril, carvedilol and amlodipine. Monitor blood pressure.    Hypothyroidism Continue levothyroxine and monitor TSH periodically.  Dr. Dorian Pod from the neuro hospitalist service was consulted by the emergency department.  Code Status: Full code. DVT Prophylaxis: Lovenox SQ. Family Communication:  Disposition Plan: Admit to telemetry monitoring, neuro checks and pending CVA workup.  Time spent: Over 70 minutes were spent during the process of this admission.  Adrian Neal [facility] Pager [phone].

## 2015-01-20 ENCOUNTER — Inpatient Hospital Stay (HOSPITAL_COMMUNITY): Payer: 59

## 2015-01-20 DIAGNOSIS — E039 Hypothyroidism, unspecified: Secondary | ICD-10-CM

## 2015-01-20 DIAGNOSIS — E1059 Type 1 diabetes mellitus with other circulatory complications: Secondary | ICD-10-CM

## 2015-01-20 DIAGNOSIS — I639 Cerebral infarction, unspecified: Principal | ICD-10-CM

## 2015-01-20 DIAGNOSIS — E785 Hyperlipidemia, unspecified: Secondary | ICD-10-CM

## 2015-01-20 DIAGNOSIS — E108 Type 1 diabetes mellitus with unspecified complications: Secondary | ICD-10-CM

## 2015-01-20 DIAGNOSIS — I6302 Cerebral infarction due to thrombosis of basilar artery: Secondary | ICD-10-CM

## 2015-01-20 DIAGNOSIS — I10 Essential (primary) hypertension: Secondary | ICD-10-CM

## 2015-01-20 LAB — LIPID PANEL
CHOL/HDL RATIO: 3.5 ratio
CHOLESTEROL: 115 mg/dL (ref 0–200)
Cholesterol: 109 mg/dL (ref 0–200)
HDL: 33 mg/dL — ABNORMAL LOW (ref >40)
HDL: 34 mg/dL — AB (ref >40)
LDL CALC: 65 mg/dL (ref 0–99)
LDL Cholesterol: 68 mg/dL (ref 0–99)
TRIGLYCERIDES: 50 mg/dL (ref <150)
Total CHOL/HDL Ratio: 3.2 RATIO
Triglycerides: 71 mg/dL (ref <150)
VLDL: 14 mg/dL (ref 0–40)
VLDL: 10 mg/dL (ref 0–40)

## 2015-01-20 LAB — GLUCOSE, CAPILLARY
GLUCOSE-CAPILLARY: 148 mg/dL — AB (ref 65–99)
GLUCOSE-CAPILLARY: 168 mg/dL — AB (ref 65–99)
GLUCOSE-CAPILLARY: 191 mg/dL — AB (ref 65–99)
GLUCOSE-CAPILLARY: 195 mg/dL — AB (ref 65–99)
GLUCOSE-CAPILLARY: 88 mg/dL (ref 65–99)
Glucose-Capillary: 63 mg/dL — ABNORMAL LOW (ref 65–99)

## 2015-01-20 MED ORDER — CLOPIDOGREL BISULFATE 75 MG PO TABS: 75.0000 mg | ORAL_TABLET | Freq: Every day | ORAL | Status: DC

## 2015-01-20 MED ORDER — INSULIN PUMP
SUBCUTANEOUS | Status: DC
  Administered 2015-01-20 (×2): via SUBCUTANEOUS
  Filled 2015-01-20: qty 1

## 2015-01-20 MED ORDER — ASPIRIN EC 81 MG PO TBEC
81.0000 mg | DELAYED_RELEASE_TABLET | Freq: Every day | ORAL | Status: DC
Start: 2015-01-20 — End: 2015-02-02

## 2015-01-20 MED ORDER — ATORVASTATIN CALCIUM 20 MG PO TABS: 20.0000 mg | ORAL_TABLET | Freq: Every day | ORAL | Status: DC

## 2015-01-20 MED ORDER — ATORVASTATIN CALCIUM 10 MG PO TABS: 20.0000 mg | ORAL_TABLET | Freq: Every day | ORAL | Status: DC

## 2015-01-20 MED ORDER — IOHEXOL 350 MG/ML SOLN
100.0000 mL | Freq: Once | INTRAVENOUS | Status: AC | PRN
  Administered 2015-01-20: 100 mL via INTRAVENOUS

## 2015-01-20 NOTE — Progress Notes (Signed)
PATIENT DETAILS Name: Adrian Neal Age: 49 y.o. Sex: male Date of Birth: April 06, 1966 Admit Date: 01/19/2015 Admitting Physician Reubin Milan, MD MS:[id number] DENNIS, MD  Subjective: Still complains of right-sided numbness-but no weakness. No other complaints.  Assessment/Plan: Principal Problem: Acute left pontine infarct: Suspect secondary to small vessel disease. Continue aspirin, Plavix. Continue statin-Will switch to Lipitor 20 mg-LDL 68 (goal<70). A1c pending. No focal neurological deficits on exam. Spoke with Dr. Erick Colace need to repeat further stroke workup-He recommends checking a CT angiogram of the head and neck. Suspect will be discharged once CT angiogram is completed.  Active Problems: Type 1 diabetes mellitus: Continue insulin pump. Await A1c.  Hyperlipidemia: As above  Essential hypertension: BP controlled-continue with amlodipine, Coreg and lisinopril. Follow and adjust accordingly.  Hypothyroidism: Continue with levothyroxine  Disposition: Remain inpatient  Antimicrobial agents  See below  Anti-infectives    None      DVT Prophylaxis: Prophylactic Lovenox   Code Status: Full code   Family Communication None at bedside  Procedures: None  CONSULTS:  neurology  Time spent 30 minutes-Greater than 50% of this time was spent in counseling, explanation of diagnosis, planning of further management, and coordination of care.  MEDICATIONS: Scheduled Meds: . amLODipine  10 mg Oral Daily  . aspirin EC  325 mg Oral Daily  . carvedilol  25 mg Oral BID WC  . clopidogrel  75 mg Oral Daily  . enoxaparin (LOVENOX) injection  40 mg Subcutaneous Q24H  . famotidine  20 mg Oral BID  . insulin pump   Subcutaneous 6 times per day  . levothyroxine  50 mcg Oral QAC breakfast  . lisinopril  2.5 mg Oral Daily  . simvastatin  40 mg Oral Daily  . sodium chloride  3 mL Intravenous Q12H   Continuous Infusions:  PRN Meds:.ondansetron  **OR** ondansetron (ZOFRAN) IV, senna-docusate  PHYSICAL EXAM: Vital signs in last 24 hours: Filed Vitals:   01/20/15 0314 01/20/15 0600 01/20/15 0800 01/20/15 1000  BP: 129/69 141/72 140/72 138/56  Pulse: 79 76 84 76  Temp: 97.8 F (36.6 C) 97.9 F (36.6 C) 97.6 F (36.4 C) 97.4 F (36.3 C)  TempSrc: Oral Oral Oral Oral  Resp: 18 16 18 20   Height:      Weight:      SpO2: 100% 98% 99% 98%    Weight change:  Filed Weights   01/19/15 2103  Weight: 78.109 kg (172 lb 3.2 oz)   Body mass index is 26.19 kg/(m^2).   Gen Exam: Awake and alert with clear speech.   Neck: Supple, No JVD.   Chest: B/L Clear.   CVS: S1 S2 Regular, no murmurs.  Abdomen: soft, BS +, non tender, non distended.  Extremities: no edema, lower extremities warm to touch. Neurologic: Non Focal.   Skin: No Rash.   Wounds: N/A.   Intake/Output from previous day: No intake or output data in the 24 hours ending 01/20/15 1342   LAB RESULTS: CBC  Recent Labs Lab 01/19/15 1348 01/19/15 1410  WBC 4.2  --   HGB 13.4 13.3  HCT 38.0* 39.0  PLT 141*  --   MCV 86.4  --   MCH 30.5  --   MCHC 35.3  --   RDW 12.2  --   LYMPHSABS 1.1  --   MONOABS 0.2  --   EOSABS 0.1  --   BASOSABS 0.0  --  Chemistries   Recent Labs Lab 01/19/15 1348 01/19/15 1410  NA 142 142  K 4.6 4.5  CL 107 103  CO2 28  --   GLUCOSE 192* 193*  BUN 24* 28*  CREATININE 1.60* 1.60*  CALCIUM 9.7  --     CBG:  Recent Labs Lab 01/19/15 1545 01/20/15 0041 01/20/15 0415 01/20/15 0822 01/20/15 1148  GLUCAP 116* 191* 148* 168* 63*    GFR Estimated Creatinine Clearance: 54 mL/min (by C-G formula based on Cr of 1.6).  Coagulation profile  Recent Labs Lab 01/19/15 1348  INR 1.09    Cardiac Enzymes No results for input(s): CKMB, TROPONINI, MYOGLOBIN in the last 168 hours.  Invalid input(s): CK  Invalid input(s): POCBNP No results for input(s): DDIMER in the last 72 hours. No results for input(s): HGBA1C  in the last 72 hours.  Recent Labs  01/20/15 0002 01/20/15 0517  CHOL 109 115  HDL 34* 33*  LDLCALC 65 68  TRIG 50 71  CHOLHDL 3.2 3.5   No results for input(s): TSH, T4TOTAL, T3FREE, THYROIDAB in the last 72 hours.  Invalid input(s): FREET3 No results for input(s): VITAMINB12, FOLATE, FERRITIN, TIBC, IRON, RETICCTPCT in the last 72 hours. No results for input(s): LIPASE, AMYLASE in the last 72 hours.  Urine Studies No results for input(s): UHGB, CRYS in the last 72 hours.  Invalid input(s): UACOL, UAPR, USPG, UPH, UTP, UGL, UKET, UBIL, UNIT, UROB, ULEU, UEPI, UWBC, URBC, UBAC, CAST, UCOM, BILUA  MICROBIOLOGY: No results found for this or any previous visit (from the past 240 hour(s)).  RADIOLOGY STUDIES/RESULTS: Dg Chest 2 View  01/19/2015   CLINICAL DATA:  Right-sided numbness since Tuesday. Nausea and vomiting.  EXAM: CHEST  2 VIEW  COMPARISON:  07/28/2014  FINDINGS: The cardiac silhouette, mediastinal and hilar contours are within normal limits and stable. Mild bronchitic type lung changes but no acute pulmonary findings. No pleural effusion. The bony thorax is intact.  IMPRESSION: Mild chronic bronchitic type lung changes but no infiltrates or effusions.   Electronically Signed   By: Marijo Sanes M.D.   On: 01/19/2015 18:06   Ct Head Wo Contrast  01/19/2015   CLINICAL DATA:  Right-sided numbness for the past 2 days. Left-sided symptoms since a CVA in March 2016.  EXAM: CT HEAD WITHOUT CONTRAST  TECHNIQUE: Contiguous axial images were obtained from the base of the skull through the vertex without intravenous contrast.  COMPARISON:  Brain MR dated 07/28/2014.  FINDINGS: Tiny area of low density in the pons on the left, near the previously demonstrated acute left mesial brachium pontis infarct. Otherwise, normal appearing cerebral hemispheres and posterior fossa structures. Normal size and position of the ventricles. No intracranial hemorrhage, mass lesion or CT evidence of acute  infarction. Unremarkable bones. Mild bilateral ethmoid and frontal sinus mucosal thickening.  IMPRESSION: 1. No acute abnormality. 2. Old, tiny left pons lacunar infarct. 3. 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   Electronically Signed   By: Claudie Revering M.D.   On: 01/19/2015 14:01   Mr Brain Wo Contrast  01/19/2015   CLINICAL DATA:  Diffuse right-sided body numbness beginning 2 days ago.  EXAM: MRI HEAD WITHOUT CONTRAST  TECHNIQUE: Multiplanar, multiecho pulse sequences of the brain and surrounding structures were obtained without intravenous contrast.  COMPARISON:  Head CT 01/19/2015 and MRI 07/28/2014  FINDINGS: There is a 5 mm acute infarct in the left pons. A subcentimeter chronic infarct is present in the medial aspect of the left  middle cerebellar peduncle with associated chronic blood products, acute on the prior MRI. There is no evidence of mass, midline shift, or extra-axial fluid collection. Ventricles and sulci are within normal limits. No significant supratentorial white matter disease is seen.  Prior right cataract extraction is noted. No significant inflammatory disease is seen in the paranasal sinuses or mastoid air cells. Major intracranial vascular flow voids are preserved.  IMPRESSION: 1. Acute 5 mm left pontine infarct. 2. Chronic left middle cerebellar peduncle infarct.   Electronically Signed   By: Logan Bores M.D.   On: 01/19/2015 19:11    Oren Binet, MD  [facility] Pager:[phone]  If 7PM-7AM, please contact night-coverage [email or web address] Password [email or web address] 01/20/2015, 1:42 PM   LOS: 1 day

## 2015-01-20 NOTE — Discharge Summary (Signed)
PATIENT DETAILS Name: Adrian Neal Age: 49 y.o. Sex: male Date of Birth: [birth year] MRN: [id number]. Admitting Physician: Reubin Milan, MD [id number] DENNIS, MD  Admit Date: 01/19/2015 Discharge date: 01/20/2015  Recommendations for Outpatient Follow-up:  1. ASA and Plavix for 3 months, and then Plavix alone.  2. A1C pending-please follow   PRIMARY DISCHARGE DIAGNOSIS:  Principal Problem:   CVA (cerebral infarction) Active Problems:   Type 1 diabetes mellitus   Hyperlipidemia   Essential hypertension   Hypothyroidism      PAST MEDICAL HISTORY: Past Medical History  Diagnosis Date  . Hypertension   . Diabetes mellitus without complication     diagnosed at age 78  . 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 Stroke     DISCHARGE MEDICATIONS: Current Discharge Medication List    START taking these medications   Details  atorvastatin (LIPITOR) 20 MG tablet Take 1 tablet (20 mg total) by mouth daily at 6 PM. Qty: 90 tablet, Refills: 0    clopidogrel (PLAVIX) 75 MG tablet Take 1 tablet (75 mg total) by mouth daily. Aspirin and Plavix for 3 months, after that-stop Aspirin-take Plavix alone Qty: 90 tablet, Refills: 0      CONTINUE these medications which have CHANGED   Details  aspirin 81 MG tablet Take 1 tablet (81 mg total) by mouth daily. Aspirin and Plavix for 3 months, after that-stop Aspirin-take Plavix alone      CONTINUE these medications which have NOT CHANGED   Details  amLODipine (NORVASC) 10 MG tablet Take 1 tablet (10 mg total) by mouth daily.    carvedilol (COREG) 25 MG tablet Take 1 tablet (25 mg total) by mouth 2 (two) times daily with a meal. Qty: 60 tablet, Refills: 0    Insulin Human (INSULIN PUMP) SOLN Inject 0-2 each into the skin every 4 (four) hours.    levothyroxine (SYNTHROID, LEVOTHROID) 50 MCG tablet Take 50 mcg by mouth daily before breakfast.    lisinopril (PRINIVIL,ZESTRIL) 2.5 MG tablet TAKE ONE  TABLET BY MOUTH ONCE DAILY Qty: 30 tablet, Refills: 0    meclizine (ANTIVERT) 25 MG tablet Take 1 tablet (25 mg total) by mouth 3 (three) times daily as needed for dizziness. Qty: 30 tablet, Refills: 0    pantoprazole (PROTONIX) 40 MG tablet TAKE ONE TABLET BY MOUTH ONCE DAILY Qty: 30 tablet, Refills: 0      STOP taking these medications     simvastatin (ZOCOR) 40 MG tablet         ALLERGIES:  No Known Allergies  BRIEF HPI:  See H&P, Labs, Consult and Test reports for all details in brief, patient was admitted for right sided weakness, MRI brain showed a left pontine infarct.  CONSULTATIONS:   neurology  PERTINENT RADIOLOGIC STUDIES: Ct Angio Head W/cm &/or Wo Cm  01/20/2015   CLINICAL DATA:  49 year old male with left pontine lacunar infarct. Right side numbness for 3 days. Initial encounter.  EXAM: CT ANGIOGRAPHY HEAD AND NECK  TECHNIQUE: Multidetector CT imaging of the head and neck was performed using the standard protocol during bolus administration of intravenous contrast. Multiplanar CT image reconstructions and MIPs were obtained to evaluate the vascular anatomy. Carotid stenosis measurements (when applicable) are obtained utilizing NASCET criteria, using the distal internal carotid diameter as the denominator.  CONTRAST:  146mL OMNIPAQUE IOHEXOL 350 MG/ML SOLN  COMPARISON:  Brain MRI without contrast 01/19/2015. Head CT 01/19/2015.  FINDINGS: CT HEAD  Brain: Acute  brainstem infarct remains occult by CT. No acute intracranial hemorrhage identified. No midline shift, mass effect, or evidence of intracranial mass lesion. Stable gray-white matter differentiation. Cerebral volume is normal. No ventriculomegaly.  Calvarium and skull base:  No acute osseous abnormality identified.  Paranasal sinuses: Mild ethmoid sinus mucosal thickening.  Orbits: No acute orbit or scalp soft tissue finding.  CTA NECK  Skeleton: Intermittent facet degeneration in the cervical spine. No acute osseous  abnormality identified.  Other neck: Negative lung apices. No superior mediastinal lymphadenopathy.  Thyroid, larynx, pharynx, parapharyngeal spaces, retropharyngeal space, sublingual space, submandibular glands, and parotid glands are within normal limits. No cervical lymphadenopathy.  Aortic arch: 3 vessel arch configuration. No arch atherosclerosis. No great vessel origin stenosis.  Right carotid system: Soft plaque in the medial left CCA at the level of the hypopharynx (series 13, image 55), not hemodynamically significant. No plaque at the right carotid bifurcation. Negative cervical right ICA.  Left carotid system: Minimal soft plaque at the left carotid bifurcation. No stenosis and otherwise negative.  Vertebral arteries:No proximal subclavian artery stenosis. Mild soft and calcified plaque at the right vertebral artery origin (series 16, image 117), no significant stenosis. Normal left vertebral artery origin. Fairly codominant vertebral arteries, otherwise are negative to the skullbase.  CTA HEAD  Posterior circulation: No distal vertebral artery stenosis. Normal PICA origins. Mild basilar artery fenestration. No basilar stenosis. Patent AICA origins. SCA and PCA origins are within normal limits. Small posterior communicating arteries. Moderate irregularity of the bilateral PCA branches, greater on the left. Preserved distal PCA branch enhancement.  Anterior circulation: Severe calcified plaque affecting the left ICA siphon with high-grade stenosis at the junction of the vertical petrous and cavernous segments best seen on series 20, image 102. This is a short segment radiographic string sign. The cavernous and supraclinoid left siphon are without additional stenosis. Left ophthalmic and posterior communicating artery origins are within normal limits. Normal left ICA terminus, left MCA, and left ACA origins.  By comparison only mild calcified plaque Ing the right ICA siphon with no hemodynamically  significant stenosis identified proximal to the anterior genu. Ophthalmic and posterior communicating artery origins are within normal limits. The supra clinoid segment is irregular but without significant stenosis. Left ICA terminus is patent, but the left MCA and ACA origins are irregular and up to moderately stenosed (series 16, image 85).  Dominant appearing left A1 segment. Diminutive right A1 segment may in part be related to atherosclerosis. Anterior communicating artery and bilateral ACA branches remain within normal limits. Right MCA M1 segment is moderately stenotic with early bifurcation. No major right MCA branch occlusion. Left MCA M1 segment demonstrates mild stenosis B on the origin. Left MCA bifurcation and left MCA branches are within normal limits.  Venous sinuses: Patent.  Anatomic variants: Stenotic versus non dominant right ACA A1 segment. Mild basilar artery fenestration.  Delayed phase: No abnormal enhancement identified.  IMPRESSION: 1. Mild but somewhat age advanced carotid atherosclerosis, greater on the right in the form of right CCA soft plaque which is not hemodynamically significant. 2. 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 3. No major circle of Willis branch occlusion and no proximal posterior circulation stenosis. 4. Stable and negative CT appearance of the  brain; brainstem infarct remains occult by CT.   Electronically Signed   By: Genevie Ann M.D.   On: 01/20/2015 18:02   Dg Chest 2 View  01/19/2015   CLINICAL DATA:  Right-sided numbness since Tuesday. Nausea and vomiting.  EXAM: CHEST  2 VIEW  COMPARISON:  07/28/2014  FINDINGS: The cardiac silhouette, mediastinal and hilar contours are within normal  limits and stable. Mild bronchitic type lung changes but no acute pulmonary findings. No pleural effusion. The bony thorax is intact.  IMPRESSION: Mild chronic bronchitic type lung changes but no infiltrates or effusions.   Electronically Signed   By: Marijo Sanes M.D.   On: 01/19/2015 18:06   Ct Head Wo Contrast  01/19/2015   CLINICAL DATA:  Right-sided numbness for the past 2 days. Left-sided symptoms since a CVA in March 2016.  EXAM: CT HEAD WITHOUT CONTRAST  TECHNIQUE: Contiguous axial images were obtained from the base of the skull through the vertex without intravenous contrast.  COMPARISON:  Brain MR dated 07/28/2014.  FINDINGS: Tiny area of low density in the pons on the left, near the previously demonstrated acute left mesial brachium pontis infarct. Otherwise, normal appearing cerebral hemispheres and posterior fossa structures. Normal size and position of the ventricles. No intracranial hemorrhage, mass lesion or CT evidence of acute infarction. Unremarkable bones. Mild bilateral ethmoid and frontal sinus mucosal thickening.  IMPRESSION: 1. No acute abnormality. 2. Old, tiny left pons lacunar infarct. 3. 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   Electronically Signed   By: Claudie Revering M.D.   On: 01/19/2015 14:01   Ct Angio Neck W/cm &/or Wo/cm  01/20/2015   CLINICAL DATA:  49 year old male with left pontine lacunar infarct. Right side numbness for 3 days. Initial encounter.  EXAM: CT ANGIOGRAPHY HEAD AND NECK  TECHNIQUE: Multidetector CT imaging of the head and neck was performed using the standard protocol during bolus administration of intravenous contrast. Multiplanar CT image reconstructions and MIPs were obtained to evaluate the vascular anatomy. Carotid stenosis measurements (when applicable) are obtained utilizing NASCET criteria, using the distal internal carotid diameter as the denominator.  CONTRAST:  196mL OMNIPAQUE IOHEXOL 350 MG/ML SOLN  COMPARISON:  Brain MRI without  contrast 01/19/2015. Head CT 01/19/2015.  FINDINGS: CT HEAD  Brain: Acute brainstem infarct remains occult by CT. No acute intracranial hemorrhage identified. No midline shift, mass effect, or evidence of intracranial mass lesion. Stable gray-white matter differentiation. Cerebral volume is normal. No ventriculomegaly.  Calvarium and skull base:  No acute osseous abnormality identified.  Paranasal sinuses: Mild ethmoid sinus mucosal thickening.  Orbits: No acute orbit or scalp soft tissue finding.  CTA NECK  Skeleton: Intermittent facet degeneration in the cervical spine. No acute osseous abnormality identified.  Other neck: Negative lung apices. No superior mediastinal lymphadenopathy.  Thyroid, larynx, pharynx, parapharyngeal spaces, retropharyngeal space, sublingual space, submandibular glands, and parotid glands are within normal limits. No cervical lymphadenopathy.  Aortic arch: 3 vessel arch configuration. No arch atherosclerosis. No great vessel origin stenosis.  Right carotid system: Soft plaque in the medial left CCA at the level of the hypopharynx (series 13, image 55), not hemodynamically significant. No plaque at the right carotid bifurcation. Negative cervical right ICA.  Left carotid system: Minimal soft plaque at the left carotid bifurcation. No stenosis and otherwise negative.  Vertebral arteries:No proximal subclavian artery stenosis. Mild soft and calcified plaque at the right vertebral artery origin (series 16, image 117), no significant stenosis. Normal left vertebral artery origin. Fairly  codominant vertebral arteries, otherwise are negative to the skullbase.  CTA HEAD  Posterior circulation: No distal vertebral artery stenosis. Normal PICA origins. Mild basilar artery fenestration. No basilar stenosis. Patent AICA origins. SCA and PCA origins are within normal limits. Small posterior communicating arteries. Moderate irregularity of the bilateral PCA branches, greater on the left. Preserved  distal PCA branch enhancement.  Anterior circulation: Severe calcified plaque affecting the left ICA siphon with high-grade stenosis at the junction of the vertical petrous and cavernous segments best seen on series 20, image 102. This is a short segment radiographic string sign. The cavernous and supraclinoid left siphon are without additional stenosis. Left ophthalmic and posterior communicating artery origins are within normal limits. Normal left ICA terminus, left MCA, and left ACA origins.  By comparison only mild calcified plaque Ing the right ICA siphon with no hemodynamically significant stenosis identified proximal to the anterior genu. Ophthalmic and posterior communicating artery origins are within normal limits. The supra clinoid segment is irregular but without significant stenosis. Left ICA terminus is patent, but the left MCA and ACA origins are irregular and up to moderately stenosed (series 16, image 85).  Dominant appearing left A1 segment. Diminutive right A1 segment may in part be related to atherosclerosis. Anterior communicating artery and bilateral ACA branches remain within normal limits. Right MCA M1 segment is moderately stenotic with early bifurcation. No major right MCA branch occlusion. Left MCA M1 segment demonstrates mild stenosis B on the origin. Left MCA bifurcation and left MCA branches are within normal limits.  Venous sinuses: Patent.  Anatomic variants: Stenotic versus non dominant right ACA A1 segment. Mild basilar artery fenestration.  Delayed phase: No abnormal enhancement identified.  IMPRESSION: 1. Mild but somewhat age advanced carotid atherosclerosis, greater on the right in the form of right CCA soft plaque which is not hemodynamically significant. 2. 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 3. No major circle of Willis branch occlusion and no proximal posterior circulation stenosis. 4. Stable and negative CT appearance of the brain; brainstem infarct remains occult by CT.   Electronically Signed   By: Genevie Ann M.D.   On: 01/20/2015 18:02   Mr Brain Wo Contrast  01/19/2015   CLINICAL DATA:  Diffuse right-sided body numbness beginning 2 days ago.  EXAM: MRI HEAD WITHOUT CONTRAST  TECHNIQUE: Multiplanar, multiecho pulse sequences of the brain and surrounding structures were obtained without intravenous contrast.  COMPARISON:  Head CT 01/19/2015 and MRI 07/28/2014  FINDINGS: There is a 5 mm acute infarct in the left pons. A subcentimeter chronic infarct is present in the medial aspect of the left middle cerebellar peduncle with associated chronic blood products, acute on the prior MRI. There is no evidence of mass, midline shift, or extra-axial fluid collection. Ventricles and sulci are within normal limits. No significant supratentorial white matter disease is seen.  Prior right cataract extraction is noted. No significant inflammatory disease is seen in the paranasal sinuses or mastoid air cells. Major intracranial vascular flow voids are preserved.  IMPRESSION: 1. Acute 5 mm left pontine infarct. 2. Chronic left middle cerebellar peduncle infarct.   Electronically Signed   By: Logan Bores M.D.   On: 01/19/2015 19:11     PERTINENT LAB  RESULTS: CBC:  Recent Labs  01/19/15 1348 01/19/15 1410  WBC 4.2  --   HGB 13.4 13.3  HCT 38.0* 39.0  PLT 141*  --    CMET CMP     Component Value Date/Time   NA 142 01/19/2015 1410   K 4.5 01/19/2015 1410   CL 103 01/19/2015 1410   CO2 28 01/19/2015 1348   GLUCOSE 193* 01/19/2015 1410   BUN 28* 01/19/2015 1410   CREATININE 1.60* 01/19/2015 1410   CALCIUM 9.7 01/19/2015 1348   PROT 6.5 01/19/2015 1348   ALBUMIN 4.0 01/19/2015 1348   AST 21  01/19/2015 1348   ALT 29 01/19/2015 1348   ALKPHOS 73 01/19/2015 1348   BILITOT 0.9 01/19/2015 1348   GFRNONAA 49* 01/19/2015 1348   GFRAA 57* 01/19/2015 1348    GFR Estimated Creatinine Clearance: 54 mL/min (by C-G formula based on Cr of 1.6). No results for input(s): LIPASE, AMYLASE in the last 72 hours. No results for input(s): CKTOTAL, CKMB, CKMBINDEX, TROPONINI in the last 72 hours. Invalid input(s): POCBNP No results for input(s): DDIMER in the last 72 hours. No results for input(s): HGBA1C in the last 72 hours.  Recent Labs  01/20/15 0002 01/20/15 0517  CHOL 109 115  HDL 34* 33*  LDLCALC 65 68  TRIG 50 71  CHOLHDL 3.2 3.5   No results for input(s): TSH, T4TOTAL, T3FREE, THYROIDAB in the last 72 hours.  Invalid input(s): FREET3 No results for input(s): VITAMINB12, FOLATE, FERRITIN, TIBC, IRON, RETICCTPCT in the last 72 hours. Coags:  Recent Labs  01/19/15 1348  INR 1.09   Microbiology: No results found for this or any previous visit (from the past 240 hour(s)).   BRIEF HOSPITAL COURSE:  Acute left pontine infarct: Suspect secondary to small vessel disease.Since recently had a stroke work up-Echo was not done. CT Angio of the neck/head showed significant intracranial disease- High grade stenosis of the left ICA siphon.Spoke with [name] over the phone, who reviewed the results and recommended Aspirin and Plavix for 3 months, and then Plavix alone after than. Continue statin-But will switch to Lipitor 20 mg-LDL 68 (goal<70)-as on Amlodipine. A1c pending at the time of discharge. No focal neurological deficits on exam. S  Active Problems: Type 1 diabetes mellitus: Continue insulin pump. A1c pending at the time of discharge.  Hyperlipidemia: As above  Essential hypertension: BP controlled-continue with amlodipine, Coreg and lisinopril. Follow and adjust accordingly.  Hypothyroidism: Continue with levothyroxine  TODAY-DAY OF DISCHARGE:  Subjective:   Arol Cottman today has no headache,no chest abdominal pain,no new weakness tingling or numbness, feels much better wants to go home today.   Objective:   Blood pressure 141/68, pulse 75, temperature 98.9 F (37.2 C), temperature source Oral, resp. rate 20, height 5\' 8"  (1.727 m), weight 78.109 kg (172 lb 3.2 oz), SpO2 99 %. No intake or output data in the 24 hours ending 01/20/15 1815 Filed Weights   01/19/15 2103  Weight: 78.109 kg (172 lb 3.2 oz)    Exam Awake Alert, Oriented *3, No new F.N deficits, Normal affect [place].AT,PERRAL Supple Neck,No JVD, No cervical lymphadenopathy appriciated.  Symmetrical Chest wall movement, Good air movement bilaterally, CTAB RRR,No Gallops,Rubs or new Murmurs, No Parasternal Heave +ve B.Sounds, Abd Soft, Non tender, No organomegaly appriciated, No rebound -guarding or rigidity. No Cyanosis, Clubbing or edema, No new Rash or bruise  DISCHARGE CONDITION: Stable  DISPOSITION: Home with outpatient PT  DISCHARGE INSTRUCTIONS:    Activity:  As tolerated  Get Medicines reviewed and adjusted: Please take all your medications with you for your next visit with your Primary MD  Please request your Primary MD to go over all hospital tests and procedure/radiological results at the follow up, please ask your Primary MD to get all Hospital records sent to his/her office.  If you experience worsening of your admission symptoms, develop shortness of breath, life threatening emergency, suicidal or homicidal thoughts you must seek medical attention immediately by calling 911 or calling your MD immediately  if symptoms less severe.  You must read complete instructions/literature along with all the possible adverse reactions/side effects for all the Medicines you take and that have been prescribed to you. Take any new Medicines after you have completely understood and accpet all the possible adverse reactions/side effects.   Do not drive when taking Pain  medications.   Do not take more than prescribed Pain, Sleep and Anxiety Medications  Special Instructions: If you have smoked or chewed Tobacco  in the last 2 yrs please stop smoking, stop any regular Alcohol  and or any Recreational drug use.  Wear Seat belts while driving.  Please note  You were cared for by a hospitalist during your hospital stay. Once you are discharged, your primary care physician will handle any further medical issues. Please note that NO REFILLS for any discharge medications will be authorized once you are discharged, as it is imperative that you return to your primary care physician (or establish a relationship with a primary care physician if you do not have one) for your aftercare needs so that they can reassess your need for medications and monitor your lab values.   Diet recommendation: Diabetic Diet Heart Healthy diet  Discharge Instructions    Ambulatory referral to Neurology    Complete by:  As directed   An appointment is requested in approximately: 8 weeks     Ambulatory referral to Physical Therapy    Complete by:  As directed            Follow-up Information    Follow up with Dwan Bolt, MD. Schedule an appointment as soon as possible for a visit in 2 weeks.   Specialty:  Endocrinology   Contact information:   [address] [place] [address] [phone]       Follow up with Xu,Jindong, MD.   Specialty:  Neurology   Why:  office will call you with a appointment, if you dont hear from them-please call them and make a appt   Contact information:   [address] [place]  [address] [phone]       Total Time spent on discharge equals 25 minutes.  SignedOren Binet 01/20/2015 6:15 PM

## 2015-01-20 NOTE — Progress Notes (Signed)
STROKE TEAM PROGRESS NOTE  HPI Adrian Neal is an 49 y.o. 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  Wife is at the bedside. Adrian Neal was seen in stroke follow up by [name] 7/16. Adrian Neal expressed that 2 days ago he woke up with a numb/tingly sensation involving his right face-arm-leg as well as " some clumsiness while walking, like a drunk person" and spinning sensation. He denies associated HA, double vision, difficulty swallowing, slurred speech, language or vision impairment. Although he thought he was likely having another stroke, he did not see immediate medical attention thinking that his symptoms will eventually dissipate. He went to see his physician today who advised him to come to the ED for stroke evaluation. MRI brain tonight was personally reviewed and showed an acute left pontine infarct. Adrian Neal said that he takes his medications for secondary stroke prevention (aspirin 325 mg, simvastatin 40 mg, lisinopril, and insulin) faithfully. Overall, he is not noticing worsening of his symptoms at this time.   Date last known well: 01/16/15 Time last known well: 10 pm tPA Given: no, late presentation    SUBJECTIVE (INTERVAL HISTORY) No family members present. The Adrian Neal is well-known to Dr. Erlinda Hong. Symptoms much improved. He still on insulin pump. Is getting IV placed for CTA head and neck.    OBJECTIVE Temp:  [97.8 F (36.6 C)-98.2 F (36.8 C)] 97.9 F (36.6 C) (09/23 0600) Pulse Rate:  [73-97] 76 (09/23 0600) Cardiac Rhythm:  [-] Normal sinus rhythm (09/22 2103) Resp:  [11-22] 16 (09/23 0600) BP: (108-164)/(57-74) 141/72 mmHg (09/23 0600) SpO2:  [94 %-100 %] 98 % (09/23 0600) Weight:  [78.109 kg (172 lb 3.2 oz)] 78.109 kg (172 lb 3.2 oz) (09/22  2103)  CBC:  Recent Labs Lab 01/19/15 1348 01/19/15 1410  WBC 4.2  --   NEUTROABS 2.8  --   HGB 13.4 13.3  HCT 38.0* 39.0  MCV 86.4  --   PLT 141*  --     Basic Metabolic Panel:  Recent Labs Lab 01/19/15 1348 01/19/15 1410  NA 142 142  K 4.6 4.5  CL 107 103  CO2 28  --   GLUCOSE 192* 193*  BUN 24* 28*  CREATININE 1.60* 1.60*  CALCIUM 9.7  --     Lipid Panel:    Component Value Date/Time   CHOL 115 01/20/2015 0517   TRIG 71 01/20/2015 0517   HDL 33* 01/20/2015 0517   CHOLHDL 3.5 01/20/2015 0517   VLDL 14 01/20/2015 0517   LDLCALC 68 01/20/2015 0517   HgbA1c:  Lab Results  Component Value Date   HGBA1C 8.1* 07/28/2014   Urine Drug Screen: No results found for: LABOPIA, COCAINSCRNUR, LABBENZ, AMPHETMU, THCU, LABBARB    IMAGING  I have personally reviewed the radiological images below and agree with the radiology interpretations.  Dg Chest 2 View 01/19/2015    Mild chronic bronchitic type lung changes but no infiltrates or effusions.     Ct Head Wo Contrast 01/19/2015    1. No acute abnormality.  2. Old, tiny left pons lacunar infarct.  3. 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     Mr Brain Wo Contrast 01/19/2015    1. Acute 5 mm left pontine infarct.  2. Chronic left middle cerebellar peduncle infarct.   Ct  Angio Head and neck W/cm &/or Wo Cm  01/20/2015    IMPRESSION: 1. Mild but somewhat age advanced carotid atherosclerosis, greater on the right in the form of right CCA soft plaque which is not hemodynamically significant. 2. 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 3. No major circle of Willis branch occlusion and no proximal posterior circulation stenosis. 4.  Stable and negative CT appearance of the brain; brainstem infarct remains occult by CT.     2D echo 07/2014 - - Left ventricle: The cavity size was normal. Wall thickness was increased in a pattern of mild LVH. Systolic function was normal. The estimated ejection fraction was in the range of 60% to 65%. Wall motion was normal; there were no regional wall motion abnormalities. Features are consistent with a pseudonormal left ventricular filling pattern, with concomitant abnormal relaxation and increased filling pressure (grade 2 diastolic dysfunction).    PHYSICAL EXAM  Temp:  [98.9 F (37.2 C)] 98.9 F (37.2 C) (09/23 1644) Pulse Rate:  [75] 75 (09/23 1644) Resp:  [20] 20 (09/23 1644) BP: (141)/(68) 141/68 mmHg (09/23 1644) SpO2:  [99 %] 99 % (09/23 1644)  General - Well nourished, well developed, in no apparent distress.  Ophthalmologic - Sharp disc margins OU.   Cardiovascular - Regular rate and rhythm with no murmur.  Mental Status -  Level of arousal and orientation to time, place, and person were intact. Language including expression, naming, repetition, comprehension was assessed and found intact. Fund of Knowledge was assessed and was intact.  Cranial Nerves II - XII - II - Visual field intact OU. III, IV, VI - Extraocular movements intact. V - Facial sensation intact bilaterally. VII - Facial movement intact bilaterally. VIII - Hearing & vestibular intact bilaterally. X - Palate elevates symmetrically. XI - Chin turning & shoulder shrug intact bilaterally. XII - Tongue protrusion intact.  Motor Strength - The Adrian Neal's strength was normal in all extremities and pronator drift was absent.  Bulk was normal and fasciculations were absent.   Motor Tone - Muscle tone was assessed at the neck and appendages and was normal.  Reflexes - The Adrian Neal's reflexes were 1+ in all extremities and he had no pathological reflexes.  Sensory - Light touch,  temperature/pinprick were assessed and were mildly decreased on the right.    Coordination - The Adrian Neal had normal movements in the hands and feet with no ataxia or dysmetria.  Tremor was absent.  Gait and Station - not tested as pt is getting IV for CTA study.   ASSESSMENT/PLAN Adrian Neal is a 49 y.o. male with history of hypertension, diabetes mellitus, retinopathy, and previous cerebellar peduncle stroke presenting with numbness of the right face arm and leg as well as gait instability. He did not receive IV t-PA due to late presentation.  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  Resultant right sided numbness with gait instability  MRI   Acute 5 mm left pontine infarct.   CTA of head and neck - diffuse intracranial athero  2D Echo 07/29/2014 - EF 60-65%.   LDL 68  HgbA1c 7.7  VTE prophylaxis - Lovenox  Diet heart healthy/carb modified Room service appropriate?: Yes; Fluid consistency:: Thin  aspirin  325 mg orally every day prior to admission, now on aspirin 325 mg orally every day and clopidogrel 75 mg orally every day. Due to diffuse intracranial stenosis, we recommend dual antiplatelet for 3 months and then Plavix alone.  Adrian Neal counseled to be compliant with his antithrombotic medications  Ongoing aggressive stroke risk factor management  Therapy recommendations: Outpatient physical therapy recommended  Disposition:  Pending  Hypertension  Stable  Permissive hypertension (OK if < 220/120) but gradually normalize in 5-7 days  Hyperlipidemia  Home meds:  Zocor 40 mg daily resumed in hospital  LDL 68, goal < 70  Now on Lipitor 20 mg daily  Continue statin at discharge  Diabetes  HgbA1c 7.7, goal < 7.0  Uncontrolled  On insulin pump  Follow-up closely with PCP  Other Stroke Risk Factors  Cigarette smoker, quit smoking 23 years ago   Hx stroke/TIA  Other Active Problems  Renal insufficiency  Hospital day  # 1  Neurology will sign off. Please call with questions. Pt will follow up with Dr. Erlinda Hong at [facility] in about 2 months as scheduled. Thanks for the consult.  Rosalin Hawking, MD PhD Stroke Neurology 01/21/2015 11:38 AM    To contact Stroke Continuity provider, please refer to [email or web address] After hours, contact General Neurology

## 2015-01-20 NOTE — Care Management Note (Signed)
Case Management Note  Patient Details  Name: HENRIQUE KIRNER MRN: [id number] Date of Birth: 02/01/1966  Subjective/Objective:                    Action/Plan: Patient admitted with CVA. Patient lives at home with his spouse. CM will continue to follow for discharge needs.  Expected Discharge Date:                  Expected Discharge Plan:  Home/Self Care  In-House Referral:     Discharge planning Services     Post Acute Care Choice:    Choice offered to:     DME Arranged:    DME Agency:     HH Arranged:    HH Agency:     Status of Service:  In process, will continue to follow  Medicare Important Message Given:    Date Medicare IM Given:    Medicare IM give by:    Date Additional Medicare IM Given:    Additional Medicare Important Message give by:     If discussed at [place] of Stay Meetings, dates discussed:    Additional Comments:  Pollie Friar, RN 01/20/2015, 11:13 AM

## 2015-01-20 NOTE — Care Management Note (Signed)
Case Management Note  Patient Details  Name: Adrian Neal MRN: [id number] Date of Birth: Mar 13, 1966  Subjective/Objective:                    Action/Plan: Patient being discharged home. MD ordered outpatient Physical therapy. CM met with patient about outpatient rehab and he has gone to [facility] in [place] before and wants to return. Order for [employer] placed through [employer].   Expected Discharge Date:                  Expected Discharge Plan:  Home/Self Care  In-House Referral:     Discharge planning Services     Post Acute Care Choice:    Choice offered to:     DME Arranged:    DME Agency:     HH Arranged:    HH Agency:     Status of Service:  In process, will continue to follow  Medicare Important Message Given:    Date Medicare IM Given:    Medicare IM give by:    Date Additional Medicare IM Given:    Additional Medicare Important Message give by:     If discussed at [place] of Stay Meetings, dates discussed:    Additional Comments:  Pollie Friar, RN 01/20/2015, 4:27 PM

## 2015-01-20 NOTE — Progress Notes (Signed)
Discharge orders received, Pt for discharge home today. IV d/c'd. D/c instructions and RX given with verbalized understanding. Family at bedside to assist patient with discharge. Staff bought pt downstairs via wheelchair. 01/20/15 1839

## 2015-01-20 NOTE — Evaluation (Signed)
Speech Language Pathology Evaluation Patient Details Name: Adrian Neal MRN: [id number] DOB: 1965/05/29 Today's Date: 01/20/2015 Time: [id number] SLP Time Calculation (min) (ACUTE ONLY): 15 min  Problem List:  Patient Active Problem List   Diagnosis Date Noted  . Hypothyroidism 01/19/2015  . HLD (hyperlipidemia) 11/22/2014  . 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 Abnormal LFTs 08/12/2014  . Ataxia   . CVA (cerebral infarction) 08/03/2014  . ARF (acute renal failure)   . Insulin dependent diabetes mellitus   . 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 Intractable nausea and vomiting 07/27/2014  . Vertigo 07/27/2014  . Essential hypertension 07/27/2014  . Type 1 diabetes mellitus 05/29/2010  . Hyperlipidemia 05/29/2010  . Benign essential HTN 05/29/2010  . 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 Hypertension   . Diabetes mellitus without complication     diagnosed at age 61  . 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 Stroke    Past Surgical History:  Past Surgical History  Procedure Laterality Date  . Eye surgery  1990    for retinopathy   . Cataract extraction w/ intraocular lens implant  1994   HPI:  Adrian Neal is a 49 y.o. male with a past medical history of CVA earlier this year, hyperlipidemia, hypertension, type 1 diabetes, diabetic retinopathy admitted with acute left pontine infarct, symptoms were decreased coordination and right sided parasthesias.   Assessment / Plan / Recommendation Clinical Impression  Pt's cognitive-linguistic function appears to be within functional limits, as assessed using subtests of the Cognistat. He seems to have good anticipatory awareness and safety awareness related to physical deficits from acute stroke. No further SLP f/u indicated.    SLP Assessment  Patient does not need any further Speech  [place] Pathology Services    Follow Up Recommendations  None    Pertinent Vitals/Pain Pain Assessment: No/denies pain   SLP Goals  Patient/Family Stated Goal: none stated  SLP Evaluation Prior Functioning  Cognitive/Linguistic Baseline: Within functional limits Type of Home: House  Lives With: Spouse;Son;Daughter Available Help at Discharge: Family;Available 24 hours/day   Cognition  Overall Cognitive Status: Within Functional Limits for tasks assessed    Comprehension  Auditory Comprehension Overall Auditory Comprehension: Appears within functional limits for tasks assessed    Expression Expression Primary Mode of Expression: Verbal Verbal Expression Overall Verbal Expression: Appears within functional limits for tasks assessed Written Expression Dominant Hand: Right   Oral / Motor Motor Speech Overall Motor Speech: Appears within functional limits for tasks assessed     Adrian Neal, M.A. CCC-SLP [phone]  Adrian Neal 01/20/2015, 4:53 PM

## 2015-01-20 NOTE — Evaluation (Signed)
Physical Therapy Evaluation Patient Details Name: Adrian Neal MRN: [id number] DOB: Jun 25, 1965 Today's Date: 01/20/2015   History of Present Illness  Adrian Neal is a 49 y.o. male with a past medical history of CVA earlier this year, hyperlipidemia, hypertension, type 1 diabetes, diabetic retinopathy admitted with acute left pontine infarct, symptoms were decreased coordination and right sided parasthesias.  Clinical Impression  Patient presents with decreased coordination and sensation limiting mobility.  Still functioning with cane and able to negotiate stairs, but high fall risk for community mobility per DGI (15/24).  Will benefit from skilled PT in the acute setting and recommend outpatient neurorehab at d/c.    Follow Up Recommendations Outpatient PT    Equipment Recommendations  None recommended by PT    Recommendations for Other Services       Precautions / Restrictions Precautions Precautions: Fall      Mobility  Bed Mobility Overal bed mobility: Independent             General bed mobility comments: pt sitting edge of bed eating lunch  Transfers Overall transfer level: Modified independent Equipment used: Straight cane                Ambulation/Gait Ambulation/Gait assistance: Supervision Ambulation Distance (Feet): 250 Feet Assistive device: Straight cane Gait Pattern/deviations: Step-through pattern;Wide base of support;Decreased stride length     General Gait Details: no loss of balance with ambulation, but slower and reports about 2/10 dizziness  Stairs            Wheelchair Mobility    Modified Rankin (Stroke Patients Only) Modified Rankin (Stroke Patients Only) Pre-Morbid Rankin Score: Slight disability Modified Rankin: Moderate disability     Balance                                 Standardized Balance Assessment Standardized Balance Assessment : Dynamic Gait Index   Dynamic Gait Index Level  Surface: Mild Impairment Change in Gait Speed: Mild Impairment Gait with Horizontal Head Turns: Mild Impairment Gait with Vertical Head Turns: Normal Gait and Pivot Turn: Moderate Impairment Step Over Obstacle: Moderate Impairment Step Around Obstacles: Normal Steps: Moderate Impairment Total Score: 15       Pertinent Vitals/Pain Pain Assessment: No/denies pain    Home Living Family/patient expects to be discharged to:: Private residence Living Arrangements: Spouse/significant other;Children Available Help at Discharge: Family;Available 24 hours/day Type of Home: House Home Access: Stairs to enter   [employer] of Steps: 1 from garage Home Layout: Two level;Bed/bath upstairs Home Equipment: [place] - 2 wheels;Cane - single point      Prior Function Level of Independence: Independent with assistive device(s)               Hand Dominance   Dominant Hand: Right    Extremity/Trunk Assessment               Lower Extremity Assessment: RLE deficits/detail;LLE deficits/detail RLE Deficits / Details: AROM and strength grossly WFL, decreased to light touch and reports hyperparasthesia on foot LLE Deficits / Details: AROM and strength grossly WFL     Communication   Communication: No difficulties  Cognition Arousal/Alertness: Awake/alert Behavior During Therapy: WFL for tasks assessed/performed Overall Cognitive Status: Within Functional Limits for tasks assessed                      General Comments      Exercises  Assessment/Plan    PT Assessment Patient needs continued PT services  PT Diagnosis Abnormality of gait   PT Problem List Decreased coordination;Impaired sensation;Decreased balance;Decreased safety awareness  PT Treatment Interventions Gait training;Stair training;Functional mobility training;Neuromuscular re-education;Balance training;Therapeutic activities   PT Goals (Current goals can be found in the Care Plan  section) Acute Rehab PT Goals Patient Stated Goal: To go  home PT Goal Formulation: With patient Time For Goal Achievement: 01/27/15 Potential to Achieve Goals: Good    Frequency Min 4X/week   Barriers to discharge        Co-evaluation               End of Session Equipment Utilized During Treatment: Gait belt Activity Tolerance: Patient tolerated treatment well Patient left: in bed;with call bell/phone within reach           Time: 1210-1235 PT Time Calculation (min) (ACUTE ONLY): 25 min   Charges:   PT Evaluation $Initial PT Evaluation Tier I: 1 Procedure PT Treatments $Gait Training: 8-22 mins   PT G Codes:        WYNN,CYNDI 2015-02-15, 1:01 PM  Magda Kiel, [place] Feb 15, 2015

## 2015-01-20 NOTE — Progress Notes (Signed)
Inpatient Diabetes Program Recommendations  AACE/ADA: New Consensus Statement on Inpatient Glycemic Control (2015)  Target Ranges:  Prepandial:   less than 140 mg/dL      Peak postprandial:   less than 180 mg/dL (1-2 hours)      Critically ill patients:  140 - 180 mg/dL   Review of Glycemic Control:  Results for Adrian Neal, [id number] (MRN [id number]) as of 01/20/2015 14:57  Ref. Range 01/20/2015 00:41 01/20/2015 04:15 01/20/2015 08:22 01/20/2015 11:48  Glucose-Capillary Latest Ref Range: 65-99 mg/dL 191 (H) 148 (H) 168 (H) 63 (L)   Diabetes history: Type 1 since age 6 Outpatient Diabetes medications: Insulin pump Current orders for Inpatient glycemic control: Insulin pump  Inpatient Diabetes Program Recommendations:    Note that patient will likely be d/c'd today.  Called and discussed insulin pump policy and flow sheet with RN.   No recommendations at this time.     Thanks, Adah Perl, RN, BC-ADM Inpatient Diabetes Coordinator Pager [phone] (8a-5p)

## 2015-01-20 NOTE — Evaluation (Signed)
Occupational Therapy Evaluation Patient Details Name: Adrian Neal MRN: [id number] DOB: [birth year] Today's Date: 01/20/2015    History of Present Illness Adrian Neal is a 49 y.o. male with a past medical history of CVA earlier this year, hyperlipidemia, hypertension, type 1 diabetes, diabetic retinopathy admitted with acute left pontine infarct, symptoms were decreased coordination and right sided parasthesias.   Clinical Impression   Patient evaluated by Occupational Therapy with no further acute OT needs identified. All education has been completed and the patient has no further questions. Pt is back to his previous  See below for any follow-up Occupational Therapy or equipment needs. OT is signing off. Thank you for this referral.      Follow Up Recommendations  No OT follow up;Supervision - Intermittent    Equipment Recommendations  None recommended by OT    Recommendations for Other Services       Precautions / Restrictions Precautions Precautions: Fall      Mobility Bed Mobility Overal bed mobility: Independent             General bed mobility comments: pt sitting edge of bed eating lunch  Transfers Overall transfer level: Modified independent Equipment used: Straight cane                  Balance                                 Standardized Balance Assessment Standardized Balance Assessment : Dynamic Gait Index   Dynamic Gait Index Level Surface: Mild Impairment Change in Gait Speed: Mild Impairment Gait with Horizontal Head Turns: Mild Impairment Gait with Vertical Head Turns: Normal Gait and Pivot Turn: Moderate Impairment Step Over Obstacle: Moderate Impairment Step Around Obstacles: Normal Steps: Moderate Impairment Total Score: 15      ADL Overall ADL's : At baseline;Modified independent                                       General ADL Comments: Pt is able to perform ADLs close to his  baseline status.  He indicates he has to move a bit slower and more focused      Vision Vision Assessment?: Yes Eye Alignment: Within Functional Limits Ocular Range of Motion: Within Functional Limits Tracking/Visual Pursuits: Able to track stimulus in all quads without difficulty Saccades: Within functional limits Visual Fields: No apparent deficits Additional Comments: Pt reports he has been reading without change from previous CVA    Perception Perception Perception Tested?: Yes   Praxis Praxis Praxis tested?: Within functional limits    Pertinent Vitals/Pain Pain Assessment: No/denies pain     Hand Dominance Right   Extremity/Trunk Assessment Upper Extremity Assessment Upper Extremity Assessment: RUE deficits/detail;LUE deficits/detail RUE Deficits / Details: pt with diminished sensation Rt UE, but it does not appare to effect him functionally  RUE Sensation: decreased light touch LUE Deficits / Details: mild ataxia due to previous CVA  LUE Sensation: decreased proprioception LUE Coordination: decreased fine motor;decreased gross motor   Lower Extremity Assessment Lower Extremity Assessment: Defer to PT evaluation RLE Deficits / Details: AROM and strength grossly WFL, decreased to light touch and reports hyperparasthesia on foot RLE Sensation: decreased light touch LLE Deficits / Details: AROM and strength grossly WFL LLE Coordination: decreased gross motor       Communication Communication Communication:  No difficulties   Cognition Arousal/Alertness: Awake/alert Behavior During Therapy: WFL for tasks assessed/performed Overall Cognitive Status: Within Functional Limits for tasks assessed                     General Comments       Exercises       Shoulder Instructions      Home Living Family/patient expects to be discharged to:: Private residence Living Arrangements: Spouse/significant other;Children Available Help at Discharge:  Family;Available 24 hours/day Type of Home: House Home Access: Stairs to enter [employer] of Steps: 1 from garage   Home Layout: Two level;Bed/bath upstairs Alternate Level Stairs-Number of Steps: 14 Alternate Level Stairs-Rails: Right;Left Bathroom Shower/Tub: Tub/shower unit Shower/tub characteristics: Architectural technologist: Standard Bathroom Accessibility: Yes How Accessible: Accessible via walker Home Equipment: Gilford Rile - 2 wheels;Cane - single point          Prior Functioning/Environment Level of Independence: Independent with assistive device(s)  Gait / Transfers Assistance Needed: ambulates with SPC ADL's / Homemaking Assistance Needed: Is mod I with ADLs   Comments: Pt has not returned to driving nor working since stroke in March     OT Diagnosis: Generalized weakness;Ataxia (mild ataxia )   OT Problem List: Impaired balance (sitting and/or standing);Decreased coordination   OT Treatment/Interventions:      OT Goals(Current goals can be found in the care plan section) Acute Rehab OT Goals Patient Stated Goal: To go  home  OT Frequency:     Barriers to D/C:            Co-evaluation              End of Session Equipment Utilized During Treatment: Other (comment) [facility]) Nurse Communication: Mobility status  Activity Tolerance: Patient tolerated treatment well Patient left: in bed;with call bell/phone within reach;Other (comment) (ST in the room )   Time: [id number] OT Time Calculation (min): 41 min Charges:  OT Evaluation $Initial OT Evaluation Tier I: 1 Procedure OT Treatments $Self Care/Home Management : 8-22 mins $Therapeutic Activity: 8-22 mins G-Codes:    Conarpe, Wendi M 2015-02-03, 4:35 PM

## 2015-01-21 DIAGNOSIS — I639 Cerebral infarction, unspecified: Secondary | ICD-10-CM | POA: Insufficient documentation

## 2015-01-21 LAB — HEMOGLOBIN A1C
Hgb A1c MFr Bld: 7.7 % — ABNORMAL HIGH (ref 4.8–5.6)
MEAN PLASMA GLUCOSE: 174 mg/dL

## 2015-01-23 LAB — HEMOGLOBIN A1C
HEMOGLOBIN A1C: 7.5 % — AB (ref 4.8–5.6)
Mean Plasma Glucose: 169 mg/dL

## 2015-02-01 ENCOUNTER — Encounter: Payer: Self-pay | Admitting: Physical Medicine & Rehabilitation

## 2015-02-02 ENCOUNTER — Telehealth: Payer: Self-pay | Admitting: *Deleted

## 2015-02-02 NOTE — Telephone Encounter (Signed)
Medication correction per [facility] message.

## 2015-02-03 ENCOUNTER — Telehealth: Payer: Self-pay | Admitting: *Deleted

## 2015-02-03 NOTE — Telephone Encounter (Signed)
lipitor increased to 40 mg

## 2015-02-04 NOTE — Telephone Encounter (Signed)
Pt was readmitted for new pontine infarct. I have not seen him since this event. He was going to outpatient therapy prior to the new stroke. He should contact his therapists and then they can request orders from me to resume therapy if required.

## 2015-02-07 NOTE — Telephone Encounter (Deleted)
Pt was readmitted for new pontine infarct. I have not seen him since this event. He was going to outpatient therapy prior to the new stroke. He should contact his therapists and then they can request orders from me to resume therapy if required.

## 2015-02-15 ENCOUNTER — Encounter: Payer: 59 | Admitting: Physical Medicine & Rehabilitation

## 2015-03-13 ENCOUNTER — Encounter: Payer: 59 | Attending: Physical Medicine & Rehabilitation | Admitting: Physical Medicine & Rehabilitation

## 2015-03-13 ENCOUNTER — Encounter: Payer: Self-pay | Admitting: Physical Medicine & Rehabilitation

## 2015-03-13 VITALS — BP 143/65 | HR 75

## 2015-03-13 DIAGNOSIS — Z794 Long term (current) use of insulin: Secondary | ICD-10-CM | POA: Insufficient documentation

## 2015-03-13 DIAGNOSIS — H8143 Vertigo of central origin, bilateral: Secondary | ICD-10-CM

## 2015-03-13 DIAGNOSIS — E11319 Type 2 diabetes mellitus with unspecified diabetic retinopathy without macular edema: Secondary | ICD-10-CM | POA: Insufficient documentation

## 2015-03-13 DIAGNOSIS — I6302 Cerebral infarction due to thrombosis of basilar artery: Secondary | ICD-10-CM | POA: Diagnosis not present

## 2015-03-13 DIAGNOSIS — I129 Hypertensive chronic kidney disease with stage 1 through stage 4 chronic kidney disease, or unspecified chronic kidney disease: Secondary | ICD-10-CM | POA: Diagnosis not present

## 2015-03-13 DIAGNOSIS — R270 Ataxia, unspecified: Secondary | ICD-10-CM | POA: Diagnosis not present

## 2015-03-13 DIAGNOSIS — Z8673 Personal history of transient ischemic attack (TIA), and cerebral infarction without residual deficits: Secondary | ICD-10-CM | POA: Insufficient documentation

## 2015-03-13 DIAGNOSIS — Z87891 Personal history of nicotine dependence: Secondary | ICD-10-CM | POA: Insufficient documentation

## 2015-03-13 DIAGNOSIS — R42 Dizziness and giddiness: Secondary | ICD-10-CM | POA: Insufficient documentation

## 2015-03-13 MED ORDER — SCOPOLAMINE 1 MG/3DAYS TD PT72: 1.0000 | MEDICATED_PATCH | TRANSDERMAL | Status: DC

## 2015-03-13 NOTE — Progress Notes (Signed)
Subjective:    Patient ID: Adrian Neal, male    DOB: 03-11-1966, 49 y.o.   MRN: [id number]  HPI   Adrian Neal is here in follow up of his pontine infarcts. He was readmitted to the hospital in September with an additional pontine infarct. He has had much more difficulty with sensation and balance since the new stroke. He is using a straight cane for balance. He hasn't had any falls since being home.  He hasn't had any further therapy since being discharged the second time. Vision remains an issue. Sugars are suboptimally controlled. He has an insulin pump.   Pain Inventory Average Pain 0 Pain Right Now 0 My pain is n/a  In the last 24 hours, has pain interfered with the following? General activity 0 Relation with others 0 Enjoyment of life 0 What TIME of day is your pain at its worst? n/a Sleep (in general) Good  Pain is worse with: n/a Pain improves with: n/a Relief from Meds: n/a  Mobility walk with assistance use a cane  Function disabled: date disabled 06/2014  Neuro/Psych numbness trouble walking dizziness  Prior Studies Any changes since last visit?  no  Physicians involved in your care Any changes since last visit?  no   Family History  Problem Relation Age of Onset  . Stroke Mother   . Hypertension Mother   . Hyperlipidemia Mother   . Hyperlipidemia Father   . Hypertension Father   . Diabetes Father    Social History   Social History  . Marital Status: Married    Spouse Name: N/A  . Number of Children: N/A  . Years of Education: N/A   Social History Main Topics  . Smoking status: Former Smoker -- 1.00 packs/day for 6 years    Quit date: 04/30/1991  . Smokeless tobacco: None  . Alcohol Use: No  . Drug Use: No  . Sexual Activity: Not Asked   Other Topics Concern  . None   Social History Narrative   Past Surgical History  Procedure Laterality Date  . Eye surgery  1990    for retinopathy   . Cataract extraction w/ intraocular lens  implant  1994   Past Medical History  Diagnosis Date  . Hypertension   . Diabetes mellitus without complication (Adrian Neal)     diagnosed at age 75  . 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 Stroke ([place])    BP 143/65 mmHg  Pulse 75  SpO2 99%  Opioid Risk Score:   Fall Risk Score:  `1  Depression screen PHQ 2/9  Depression screen PHQ 2/9 09/19/2014  Decreased Interest 0  Down, Depressed, Hopeless 0  PHQ - 2 Score 0  Altered sleeping 0  Tired, decreased energy 0  Change in appetite 0  Feeling bad or failure about yourself  0  Trouble concentrating 0  Moving slowly or fidgety/restless 0  Suicidal thoughts 0  PHQ-9 Score 0      Review of Systems  Musculoskeletal: Positive for gait problem.  Neurological: Positive for dizziness and numbness.  All other systems reviewed and are negative.      Objective:   Physical Exam  Constitutional: He is oriented to person, place, and time. He appears well-developed and well-nourished. comfortable  HENT: oral mucosa pink and moist  Head: Normocephalic and atraumatic.  Eyes: Conjunctivae are normal. Pupils are equal, round, and reactive to light.  Neck: Normal range of motion. Neck supple.  Cardiovascular: Normal rate and regular rhythm. no murmur  Respiratory: Effort normal and breath sounds normal. No respiratory distress. He has no wheezes. He exhibits no tenderness.  GI: Soft. Bowel sounds are normal. He exhibits no distension. There is no tenderness.  Musculoskeletal: He exhibits no edema or tenderness.  Neurological: He is alert and oriented to person, place, and time.   Strength nearly 5/5 to 5/5 in all 4's. Speech clear and articulate. Follows commands without difficulty. Mild Ataxia with left finger to nose is mild but persistent. Romberg positive   4-5 beats of nystagmus with gaze to right, 2 beats to the left. Still squints with right eye to improve visual acuity on that side. Leans to right with gait,  tends to drift to that side. Has dificulty with changing directions.  Psychiatric: Calm and appropriate  Skin: Skin is warm and dry.  Psychiatric: He has a normal mood and affect. His speech is normal and behavior is normal. Judgment and thought content normal. Cognition and memory are normal.     Assessment/Plan:  1. 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  - he remains pre-vocational for even a sedentary job  -consider aquatic based therapy---pt will let me know 2. Pain Management: N/A  3 CKD: per pcp  4. DM type 1: needs to seek better control. Insulin pump adjustment  I'll see him back in about 2 months. Thirty minutes of face to face patient care time were spent during this visit. All questions were encouraged and answered.

## 2015-03-13 NOTE — Patient Instructions (Addendum)
PLEASE CALL ME WITH ANY PROBLEMS OR QUESTIONS [id number]). HAVE A HAPPY HOLIDAY SEASON!!!   THERAPY?  POOL?  INSULIN PUMP ADJUSTMENT!!

## 2015-03-27 ENCOUNTER — Ambulatory Visit: Payer: 59 | Admitting: Neurology

## 2015-03-28 ENCOUNTER — Ambulatory Visit (INDEPENDENT_AMBULATORY_CARE_PROVIDER_SITE_OTHER): Payer: 59 | Admitting: Neurology

## 2015-03-28 ENCOUNTER — Encounter: Payer: Self-pay | Admitting: Neurology

## 2015-03-28 VITALS — BP 132/67 | HR 79 | Ht 68.0 in | Wt 182.0 lb

## 2015-03-28 DIAGNOSIS — E1159 Type 2 diabetes mellitus with other circulatory complications: Secondary | ICD-10-CM | POA: Diagnosis not present

## 2015-03-28 DIAGNOSIS — G4733 Obstructive sleep apnea (adult) (pediatric): Secondary | ICD-10-CM | POA: Diagnosis not present

## 2015-03-28 DIAGNOSIS — I10 Essential (primary) hypertension: Secondary | ICD-10-CM

## 2015-03-28 DIAGNOSIS — I6302 Cerebral infarction due to thrombosis of basilar artery: Secondary | ICD-10-CM

## 2015-03-28 DIAGNOSIS — E785 Hyperlipidemia, unspecified: Secondary | ICD-10-CM | POA: Diagnosis not present

## 2015-03-28 NOTE — Patient Instructions (Signed)
-   continue ASA and plavix for another one month, and then plavix alone - continue lipitor for stroke prevention - check BP and glucose at home - follow up with endocrinologist for better glucose control - Follow up with your primary care physician for stroke risk factor modification. Recommend maintain blood pressure goal <130/80, diabetes with hemoglobin A1c goal below 6.5% and lipids with LDL cholesterol goal below 70 mg/dL.  - make appointment for PT/OT  - will do sleep study.  - follow up in 4 months.

## 2015-03-28 NOTE — Progress Notes (Signed)
STROKE NEUROLOGY FOLLOW UP NOTE  NAME: Adrian Neal DOB: [birth year]  REASON FOR VISIT: stroke follow up HISTORY FROM: pt and chart  Today we had the pleasure of seeing Adrian Neal in follow-up at our Neurology Clinic. Pt was accompanied by no one.   History Summary [name] is a 49 y.o. male with history of hypertension, diabetes, and hyperlipidemia presenting with nausea, vomiting, and dizziness on 07/27/14. Exam showed left UE ataxia. MRI showed acute punctate left cerebellar peduncle infarct. CUS unremarkable, 2D echo showed LVH, LDL 70 and A1C 8.1. He was discharge to CIR in good condition with ASA and zocor.   11/22/14 follow up - the patient has been doing well. No recurrent symptoms. He still works with PT/OT as outpt for gait and balance. His LUE ataxia much improved but still has gait incoordination. He still feels some subtle LUE fine motor incoordination at times. His recent A1C was 7.1 but LDL 135. He follows with PCP regularly. BP today 105/62.    01/19/15 hospital admission - pt was admitted for right face arm and leg numbness with worsening gait instability. MRI showed small left pontine infarct. 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 TTE showed EF 60-65%, LDL 68 and A1C 7.7. He was put on dual antiplatelet and lipitor. Discharged with outpt PT/OT.   Interval History During the interval time, pt has been doing the same. Not started PT/OT yet, pending appointment. He still on dual antiplatelet and no side effects. Still has dorsal right hand numbness as well as walking instability. BP stable 132/67 today in clinic.  REVIEW OF SYSTEMS: Full 14 system review of systems performed and notable only for those listed below and in HPI above, all others are negative:  Constitutional:   Cardiovascular:  Ear/Nose/Throat:   Skin:  Eyes:   Respiratory:   Gastroitestinal:   Genitourinary:    Hematology/Lymphatic:   Endocrine:  Musculoskeletal:   Allergy/Immunology:   Neurological:  Dizziness, numbness Psychiatric:  Sleep:   The following represents the patient's updated allergies and side effects list: No Known Allergies  The neurologically relevant items on the patient's problem list were reviewed on today's visit.  Neurologic Examination  A problem focused neurological exam (12 or more points of the single system neurologic examination, vital signs counts as 1 point, cranial nerves count for 8 points) was performed.  Blood pressure 132/67, pulse 79, height 5\' 8"  (1.727 m), weight 182 lb (82.555 kg).  General - Well nourished, well developed, in no apparent distress.  Ophthalmologic - Fundi not visualized due to small pupils.  Cardiovascular - Regular rate and rhythm with no murmur.  Mental Status -  Level of arousal and orientation to time, place, and person were intact. Language including expression, naming, repetition, comprehension was assessed and found intact. Fund of Knowledge was assessed and was intact.  Cranial Nerves II - XII - II - Visual field intact OU. III, IV, VI - Extraocular movements intact. V - Facial sensation intact bilaterally. VII - Facial movement intact bilaterally. VIII - Hearing & vestibular intact bilaterally. X - Palate elevates symmetrically. XI - Chin turning & shoulder shrug intact bilaterally. XII - Tongue protrusion intact.  Motor Strength - The patient's strength was normal in all extremities and pronator drift was absent.  Bulk was normal and fasciculations were absent.   Motor Tone - Muscle tone was assessed at the neck and appendages and was normal.  Reflexes - The patient's reflexes were 1+ in all extremities and he had no pathological reflexes.  Sensory - Light touch, temperature/pinprick were assessed and were normal.    Coordination - left UE ataxic on FTN, but HTS is intact but slow.  Tremor was absent.  Gait  and Station - broad based gait, en bloc turns, cautious gait and slow gait.  Data reviewed: I personally reviewed the images and agree with the radiology interpretations.  Mr Adrian Neal Contrast 07/28/2014  Acute sub cm LEFT mesial brachium pontis infarct. Otherwise normal MRI of the Adrian without contrast for age. Mild paranasal sinusitis, trace LEFT mastoid effusion.   US Renal 07/28/2014  Negative.   Dg Chest Port 1 View 07/28/2014  No active disease.   CUS - Bilateral: 1-39% ICA stenosis. Vertebral artery flow is antegrade.  Ct Head Neal Contrast 01/19/2015  1. No acute abnormality.  2. Old, tiny left pons lacunar infarct.  3. 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   Mr Adrian Neal Contrast 01/19/2015  1. Acute 5 mm left pontine infarct.  2. Chronic left middle cerebellar peduncle infarct.   Ct Angio Head and neck W/cm &/or Neal Cm  01/20/2015 IMPRESSION: 1. Mild but somewhat age advanced carotid atherosclerosis, greater on the right in the form of right CCA soft plaque which is not hemodynamically significant. 2. 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 3. No major circle of Willis branch occlusion and no proximal posterior circulation stenosis. 4. Stable and negative CT appearance of the Adrian; brainstem infarct remains occult by CT.   2D echo 07/2014 - - Left ventricle: The cavity size was normal. Wall thickness was increased in a pattern of mild LVH. Systolic function was normal. The estimated ejection fraction was in the range of 60% to 65%. Wall motion was normal; there were no regional wall motion abnormalities. Features are consistent with a pseudonormal  left ventricular filling pattern, with concomitant abnormal relaxation and increased filling pressure (grade 2 diastolic dysfunction).    Component     Latest Ref Rng 07/28/2014  Cholesterol     0 - 200 mg/dL 134  Triglycerides     <150 mg/dL 66  HDL Cholesterol     >39 mg/dL 51  Total CHOL/HDL Ratio      2.6  VLDL     0 - 40 mg/dL 13  LDL (calc)     0 - 99 mg/dL 70  Hemoglobin A1C     4.8 - 5.6 % 8.1 (H)  Mean Plasma Glucose      186  TSH     0.350 - 4.500 uIU/mL 2.756   Component     Latest Ref Rng 01/19/2015 01/20/2015 01/20/2015         12:02 AM  5:17 AM  Cholesterol     0 - 200 mg/dL  109 115  Triglycerides     <150 mg/dL  50 71  HDL Cholesterol     >40 mg/dL  34 (L) 33 (L)  Total CHOL/HDL Ratio       3.2 3.5  VLDL     0 - 40 mg/dL  10 14  LDL (calc)     0 - 99 mg/dL  65 68  Hemoglobin A1C     4.8 - 5.6 % 7.5 (H)  7.7 (H)  Mean Plasma Glucose      169  174    Assessment: As you may recall, he is a 49 y.o. Caucasian male with PMH of HTN, HLD, DM was admitted on 07/27/14 for left cerebellar peduncle infarcts, felt to be due to small vessel disease. His A1C 8.1 and LDL 70. CUS unremarkable and TTE showed LVH. He was put on ASA and zocor. During the interval time, he was doing well, continued on PT/OT, still has mild gait incoordination difficulty. However, he was readmitted to [facility] on 01/19/15 for small left pontine infarct. 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 TTE showed EF 60-65%, LDL 68 and A1C 7.7. He was put on dual antiplatelet and lipitor. Discharged home, PT/OT pending, still has left UE ataxia and gait difficulty. S/s of OSA as per wife.  Plan:  - continue ASA and plavix for total 3 months, and then plavix alone - continue lipitor for stroke prevention - check BP and glucose at home - follow up with endocrinologist for better glucose control - Follow up with your primary care physician for  stroke risk factor modification. Recommend maintain blood pressure goal <130/80, diabetes with hemoglobin A1c goal below 6.5% and lipids with LDL cholesterol goal below 70 mg/dL.  - make appointment for PT/OT  - will do sleep study.  - follow up in 4 months.   I spent more than 25 minutes of face to face time with the patient. Greater than 50% of time was spent in counseling and coordination of care. We have discussed about better DM control and importance of PT/OT as well as importance of sleep study and treat for OSA.    Orders Placed This Encounter  Procedures  . Ambulatory referral to Sleep Studies    Referral Priority:  Routine    Referral Type:  Consultation    Referral Reason:  Specialty Services Required    Number of Visits Requested:  1    No orders of the defined types were placed in this encounter.    Patient Instructions  - continue ASA and plavix for another one month, and then plavix alone - continue lipitor for stroke prevention - check BP and glucose at home - follow up with endocrinologist for better glucose control - Follow up with your primary care physician for stroke risk factor modification. Recommend maintain blood pressure goal <130/80, diabetes with hemoglobin A1c goal below 6.5% and lipids with LDL cholesterol goal below 70 mg/dL.  - make appointment for PT/OT  - will do sleep study.  - follow up in 4 months.     Rosalin Hawking, MD PhD [facility] [address] [place], [place] [address] [phone]

## 2015-03-29 DIAGNOSIS — G4733 Obstructive sleep apnea (adult) (pediatric): Secondary | ICD-10-CM | POA: Insufficient documentation

## 2015-03-29 DIAGNOSIS — E1159 Type 2 diabetes mellitus with other circulatory complications: Secondary | ICD-10-CM | POA: Insufficient documentation

## 2015-04-05 ENCOUNTER — Ambulatory Visit (INDEPENDENT_AMBULATORY_CARE_PROVIDER_SITE_OTHER): Payer: 59 | Admitting: Neurology

## 2015-04-05 ENCOUNTER — Encounter: Payer: Self-pay | Admitting: Neurology

## 2015-04-05 VITALS — BP 144/68 | HR 80 | Resp 20 | Ht 68.0 in | Wt 183.0 lb

## 2015-04-05 DIAGNOSIS — I63212 Cerebral infarction due to unspecified occlusion or stenosis of left vertebral arteries: Secondary | ICD-10-CM | POA: Diagnosis not present

## 2015-04-05 DIAGNOSIS — G4730 Sleep apnea, unspecified: Secondary | ICD-10-CM | POA: Diagnosis not present

## 2015-04-05 DIAGNOSIS — I639 Cerebral infarction, unspecified: Secondary | ICD-10-CM | POA: Diagnosis not present

## 2015-04-05 DIAGNOSIS — R0683 Snoring: Secondary | ICD-10-CM | POA: Diagnosis not present

## 2015-04-05 DIAGNOSIS — I6381 Other cerebral infarction due to occlusion or stenosis of small artery: Secondary | ICD-10-CM

## 2015-04-05 NOTE — Patient Instructions (Signed)

## 2015-04-05 NOTE — Progress Notes (Deleted)
STROKE NEUROLOGY FOLLOW UP NOTE  NAME: Adrian Neal DOB: 11-Dec-1965  REASON FOR VISIT: stroke follow up HISTORY FROM: pt and chart  Today we had the pleasure of seeing Adrian Neal in follow-up at our Neurology Clinic. Pt was accompanied by no one.   History Summary Adrian Neal is a 49 y.o. male with history of hypertension, diabetes, and hyperlipidemia presenting with nausea, vomiting, and dizziness on 07/27/14. Exam showed left UE ataxia. MRI showed acute punctate left cerebellar peduncle infarct. CUS unremarkable, 2D echo showed LVH, LDL 70 and A1C 8.1. He was discharge to CIR in good condition with ASA and zocor.   11/22/14 follow up - the patient has been doing well. No recurrent symptoms. He still works with PT/OT as outpt for gait and balance. His LUE ataxia much improved but still has gait incoordination. He still feels some subtle LUE fine motor incoordination at times. His recent A1C was 7.1 but LDL 135. He follows with PCP regularly. BP today 105/62.    01/19/15 hospital admission - pt was admitted for right face arm and leg numbness with worsening gait instability. MRI showed small left pontine infarct. 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 TTE showed EF 60-65%, LDL 68 and A1C 7.7. He was put on dual antiplatelet and lipitor. Discharged with outpt PT/OT.   Interval History During the interval time, pt has been doing the same. Not started PT/OT yet, pending appointment. He still on dual antiplatelet and no side effects. Still has dorsal right hand numbness as well as walking instability. BP stable 132/67 today in clinic.  REVIEW OF SYSTEMS: Full 14 system review of systems performed and notable only for those listed below and in HPI above, all others are negative:  Constitutional:   Cardiovascular:  Ear/Nose/Throat:   Skin:  Eyes:   Respiratory:   Gastroitestinal:   Genitourinary:    Hematology/Lymphatic:   Endocrine:  Musculoskeletal:   Allergy/Immunology:   Neurological:  Dizziness, numbness Psychiatric:  Sleep:   The following represents the patient's updated allergies and side effects list: No Known Allergies  The neurologically relevant items on the patient's problem list were reviewed on today's visit.  Neurologic Examination  A problem focused neurological exam (12 or more points of the single system neurologic examination, vital signs counts as 1 point, cranial nerves count for 8 points) was performed.  Blood pressure 144/68, pulse 80, resp. rate 20, height 5\' 8"  (1.727 m), weight 183 lb (83.008 kg).  General - Well nourished, well developed, in no apparent distress.  Ophthalmologic - Fundi not visualized due to small pupils.  Cardiovascular - Regular rate and rhythm with no murmur.  Mental Status -  Level of arousal and orientation to time, place, and person were intact. Language including expression, naming, repetition, comprehension was assessed and found intact. Fund of Knowledge was assessed and was intact.  Cranial Nerves II - XII - II - Visual field intact OU. III, IV, VI - Extraocular movements intact. V - Facial sensation intact bilaterally. VII - Facial movement intact bilaterally. VIII - Hearing & vestibular intact bilaterally. X - Palate elevates symmetrically. XI - Chin turning & shoulder shrug intact bilaterally. XII - Tongue protrusion intact.  Motor Strength - The patient's strength was normal in all extremities and pronator drift was absent.  Bulk was normal and fasciculations were absent.   Motor Tone - Muscle tone was assessed at the neck and appendages  and was normal.  Reflexes - The patient's reflexes were 1+ in all extremities and he had no pathological reflexes.  Sensory - Light touch, temperature/pinprick were assessed and were normal.    Coordination - left UE ataxic on FTN, but HTS is intact but slow.  Tremor was  absent.  Gait and Station - broad based gait, en bloc turns, cautious gait and slow gait.  Data reviewed: I personally reviewed the images and agree with the radiology interpretations.  Mr Brain Wo Contrast 07/28/2014  Acute sub cm LEFT mesial brachium pontis infarct. Otherwise normal MRI of the brain without contrast for age. Mild paranasal sinusitis, trace LEFT mastoid effusion.   US Renal 07/28/2014  Negative.   Dg Chest Port 1 View 07/28/2014  No active disease.   CUS - Bilateral: 1-39% ICA stenosis. Vertebral artery flow is antegrade.  Ct Head Wo Contrast 01/19/2015  1. No acute abnormality.  2. Old, tiny left pons lacunar infarct.  3. 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   Mr Brain Wo Contrast 01/19/2015  1. Acute 5 mm left pontine infarct.  2. Chronic left middle cerebellar peduncle infarct.   Ct Angio Head and neck W/cm &/or Wo Cm  01/20/2015 IMPRESSION: 1. Mild but somewhat age advanced carotid atherosclerosis, greater on the right in the form of right CCA soft plaque which is not hemodynamically significant. 2. 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 3. No major circle of Willis branch occlusion and no proximal posterior circulation stenosis. 4. Stable and negative CT appearance of the brain; brainstem infarct remains occult by CT.   2D echo 07/2014 - - Left ventricle: The cavity size was normal. Wall thickness was increased in a pattern of mild LVH. Systolic function was normal. The estimated ejection fraction was in the range of 60% to 65%. Wall motion was normal; there were no regional wall motion abnormalities. Features are consistent  with a pseudonormal left ventricular filling pattern, with concomitant abnormal relaxation and increased filling pressure (grade 2 diastolic dysfunction).    Component     Latest Ref Rng 07/28/2014  Cholesterol     0 - 200 mg/dL 134  Triglycerides     <150 mg/dL 66  HDL Cholesterol     >39 mg/dL 51  Total CHOL/HDL Ratio      2.6  VLDL     0 - 40 mg/dL 13  LDL (calc)     0 - 99 mg/dL 70  Hemoglobin A1C     4.8 - 5.6 % 8.1 (H)  Mean Plasma Glucose      186  TSH     0.350 - 4.500 uIU/mL 2.756   Component     Latest Ref Rng 01/19/2015 01/20/2015 01/20/2015         12:02 AM  5:17 AM  Cholesterol     0 - 200 mg/dL  109 115  Triglycerides     <150 mg/dL  50 71  HDL Cholesterol     >40 mg/dL  34 (L) 33 (L)  Total CHOL/HDL Ratio       3.2 3.5  VLDL     0 - 40 mg/dL  10 14  LDL (calc)     0 - 99 mg/dL  65 68  Hemoglobin A1C     4.8 - 5.6 % 7.5 (H)  7.7 (H)  Mean Plasma Glucose      169  174    Assessment: As you may recall, he is a 49 y.o. Caucasian male with PMH of HTN, HLD, DM was admitted on 07/27/14 for left cerebellar peduncle infarcts, felt to be due to small vessel disease. His A1C 8.1 and LDL 70. CUS unremarkable and TTE showed LVH. He was put on ASA and zocor. During the interval time, he was doing well, continued on PT/OT, still has mild gait incoordination difficulty. However, he was readmitted to [facility] on 01/19/15 for small left pontine infarct. 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 TTE showed EF 60-65%, LDL 68 and A1C 7.7. He was put on dual antiplatelet and lipitor. Discharged home, PT/OT pending, still has left UE ataxia and gait difficulty. S/s of OSA as per wife.  Plan:  - continue ASA and plavix for total 3 months, and then plavix alone - continue lipitor for stroke prevention - check BP and glucose at home - follow up with endocrinologist for better glucose control - Follow up with your  primary care physician for stroke risk factor modification. Recommend maintain blood pressure goal <130/80, diabetes with hemoglobin A1c goal below 6.5% and lipids with LDL cholesterol goal below 70 mg/dL.  - make appointment for PT/OT  - will do sleep study.  - follow up in 4 months.   I spent more than 25 minutes of face to face time with the patient. Greater than 50% of time was spent in counseling and coordination of care. We have discussed about better DM control and importance of PT/OT as well as importance of sleep study and treat for OSA.   Rosalin Hawking, MD PhD [facility] [address] [place], [place] [address] [phone]

## 2015-04-05 NOTE — Progress Notes (Signed)
SLEEP MEDICINE CLINIC   Provider:  Larey Seat, M D  Referring Provider: Anda Kraft, MD Primary Care Physician:  Dwan Bolt, MD  Chief Complaint  Patient presents with  . New Patient (Initial Visit)    dr. Erlinda Hong referral, snores at night, never had a sleep study, rm 89, with wife and son    HPI:  Adrian Neal is a 49 y.o. male , seen here as a referral/ revisit  from Dr. Erlinda Hong, stroke MD at [facility].  This is a hospital follow-up.   This patient is a 49 year old Caucasian right-handed male with a history of hypertension, diabetes and hyperlipidemia who presented on 07-27-14 with nausea, vomiting and dizziness. During his exam he also short upper extremity left-sided ataxia and dysmetria. 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 2-D echo showed left ventricular hypertrophy, his LDL was normal his HbA1c was elevated at 8.1. He was discharged with aspirin and Zocor. He followed up on 11-22-14 and again on 01-19-15  , when he suffered a second stroke !  . For the last time he saw Dr. Erlinda Hong on 03-25-15 when a sleep study was discussed.  His wife has reported that he is a loud snorer and the patient acknowledges that he is excessively daytime sleepy and easier fatigued. He endorsed today the Epworth sleepiness score at 15 points his fatigue severity score was 49 points. He does not feel depressed but he feels at times frustrated with not being able to do the things he could do before the stroke. What gives the most recent for concerns clumsiness, loss of balance and coordination which affects his ability to be productive at work.   Sleep habits are as follows: The patient usually wakes up at around 9 AM and leaves the bed. This is a spontaneous wake up time. He is not using caffeine in the morning. When his son Rollie returns from school at about 2:40 PM he and his father will watch some cartoons and sometimes that's when Mr. [place] gets very sleepy. He does not  plan for a nap but he has to fight not falling asleep. A nap may last 20-30 minutes more a power nap. He feels a little refreshed when he wakes up but certainly does not feel worse than before. He drinks caffeinated sodas if available to him. At dinner time , which is around 4:30 PM he feels more alert. He can watch TV in the late afternoon early evening without feeling a sleepy. He would go to bed around 11 PM sometimes he will have fallen asleep watching TV before. The bedroom is described as core, quiet and dark., he currently sleeps alone in his bedroom. His wife still can hear him snore in another room. He cannot recall ever being woken up by his own snoring, neither by gasping for air or being air hungry. Every night he goes to the bathroom at around 4 AM. Otherwise he sleeps through the night. He dreams but he does not recall any nightmarish character  or threatening dreams. He has no history of sleepwalking or night terrors. He does not wake up with palpitations or being diaphoretic.  He feels refreshed and restored in the morning usually and he does not report having a dry mouth or morning headaches.  Medical history  9 DM , 2 CVAs, HTN, Hypersomnia, fatigue.  Family sleep history: none    History Summary per [name], Hospital stroke service.  [name] is a 49  y.o. male with history of hypertension, diabetes, and hyperlipidemia presenting with nausea, vomiting, and dizziness on 07/27/14. Exam showed left UE ataxia. MRI showed acute punctate left cerebellar peduncle infarct. CUS unremarkable, 2D echo showed LVH, LDL 70 and A1C 8.1. He was discharge to CIR in good condition with ASA and zocor.  In follow up - the patient has been doing well. No recurrent symptoms. He still works with PT/OT as outpt for gait and balance. His LUE ataxia much improved but still has gait incoordination. He still feels some subtle LUE fine motor incoordination at times. His recent A1C was 7.1 but LDL 135. He  follows with PCP regularly. BP today 105/62.    01/19/15 hospital admission - pt was admitted for right face arm and leg numbness with worsening gait instability. MRI showed small left pontine infarct. 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 TTE showed EF 60-65%, LDL 68 and A1C 7.7. He was put on dual antiplatelet and lipitor. Discharged with outpt PT/OT.   Interval History During the interval time, pt has been doing the same. Not started PT/OT yet, pending appointment. He still on dual antiplatelet and no side effects. Still has dorsal right hand numbness as well as walking instability. BP stable 132/67 today in clinic.  Social history: no smoker ( quit  25 years ), non drinker.   Review of Systems: Out of a complete 14 system review, the patient complains of only the following symptoms, and all other reviewed systems are negative. Snoring, EDS hemiparesis, ataxia and balance , gait disurbance.    Social History   Social History  . Marital Status: Married    Spouse Name: N/A  . Number of Children: N/A  . Years of Education: N/A   Occupational History  . Not on file.   Social History Main Topics  . Smoking status: Former Smoker -- 1.00 packs/day for 6 years    Quit date: 04/30/1991  . Smokeless tobacco: Not on file  . Alcohol Use: No  . Drug Use: No  . Sexual Activity: Not on file   Other Topics Concern  . Not on file   Social History Narrative    Family History  Problem Relation Age of Onset  . Hypertension Mother   . Hyperlipidemia Mother   . Hyperlipidemia Father   . Hypertension Father   . Diabetes Father   . Stroke Brother   . Diabetes Brother     Past Medical History  Diagnosis Date  . Hypertension   . Diabetes mellitus without complication ([place])     diagnosed at age 55  . 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 Stroke ([place])   . Eye problems   . Chronic kidney  disease   . High cholesterol     Past Surgical History  Procedure Laterality Date  . Eye surgery  1990    for retinopathy   . Cataract extraction w/ intraocular lens implant  1994    Current Outpatient Prescriptions  Medication Sig Dispense Refill  . amLODipine (NORVASC) 10 MG tablet Take 1 tablet (10 mg total) by mouth daily.    Marland Kitchen aspirin 325 MG EC tablet Take 325 mg by mouth daily.    Marland Kitchen atorvastatin (LIPITOR) 40 MG tablet Take 40 mg by mouth daily at 6 PM.    . carvedilol (COREG) 25 MG tablet Take 1 tablet (25 mg total) by mouth 2 (two)  times daily with a meal. 60 tablet 0  . clopidogrel (PLAVIX) 75 MG tablet Take 1 tablet (75 mg total) by mouth daily. Aspirin and Plavix for 3 months, after that-stop Aspirin-take Plavix alone 90 tablet 0  . Insulin Human (INSULIN PUMP) SOLN Inject 0-2 each into the skin every 4 (four) hours.    Marland Kitchen levothyroxine (SYNTHROID, LEVOTHROID) 50 MCG tablet Take 50 mcg by mouth daily before breakfast.    . lisinopril (PRINIVIL,ZESTRIL) 10 MG tablet Take 10 mg by mouth daily.    . pantoprazole (PROTONIX) 40 MG tablet TAKE ONE TABLET BY MOUTH ONCE DAILY 30 tablet 0  . scopolamine (TRANSDERM-SCOP, 1.5 MG,) 1 MG/3DAYS Place 1 patch (1.5 mg total) onto the skin every 3 (three) days. 10 patch 3   No current facility-administered medications for this visit.    Allergies as of 04/05/2015  . (No Known Allergies)    Vitals: BP 144/68 mmHg  Pulse 80  Resp 20  Ht 5\' 8"  (1.727 m)  Wt 183 lb (83.008 kg)  BMI 27.83 kg/m2 Last Weight:  Wt Readings from Last 1 Encounters:  04/05/15 183 lb (83.008 kg)   [id number] mass index is 27.83 kg/(m^2).     Last Height:   Ht Readings from Last 1 Encounters:  04/05/15 5\' 8"  (1.727 m)    Physical exam:  General: The patient is awake, alert and appears not in acute distress. The patient is well groomed. Head: Normocephalic, atraumatic. Neck is supple. Mallampati: The patient's uvular is barely visible and does not left fully  above the tongue. There is no narrower restriction. The soft tissue appears reddened and swollen.  neck circumference:17.25 . Nasal airflow unrestricted Cardiovascular:  Regular rate and rhythm, without  murmurs or carotid bruit, and without distended neck veins. Respiratory: Lungs are clear to auscultation. Skin:  Without evidence of edema, or rash Trunk: BMI is 28.  Neurologic exam : The patient is awake and alert, oriented to place and time.   Memory subjective  described as intact.  Attention span & concentration ability appears normal.  Speech is fluent,  without  dysarthria, dysphonia or aphasia.  Mood and affect are depressed , Cranial nerves: Pupils are equal and briskly reactive to light. Extraocular movements  in vertical and horizontal planes intact and without nystagmus. Visual fields by finger perimetry are intact. Hearing to finger rub intact.   Facial sensation intact to fine touch.  Facial motor strength is symmetric and tongue and uvula move midline. Shoulder shrug was symmetrical.     The patient was advised of the nature of the diagnosed sleep disorder , the treatment options and risks for general a health and wellness arising from not treating the condition.  I spent more than 50 minutes of face to face time with the patient. Greater than 50% of time was spent in counseling and coordination of care. We have discussed the diagnosis and differential and I answered the patient's questions.     Assessment:  After physical and neurologic examination, review of laboratory studies,  Personal review of imaging studies, reports of other /same  Imaging studies ,  Results of polysomnography/ neurophysiology testing and pre-existing records as far as provided in visit., my assessment is   1) this patient has undergone a complete workup for stroke in the young - he has developed left upper extremity ataxia but no tremor is noted, he has a very cautious gait with a wider base and he  turns with 4 steps en bloc. Obstructive  sleep apnea risk is increased in a stroke patient who stroke affected his ability to swallow and was located in the brainstem area. What I would like for this patient is to undergo a sleep study to screen him for obstructive sleep apnea.  Clinically it is evident that she snores which has been witnessed by his spouse.  He himself endorses symptoms of excessive daytime sleepiness and fatigue but in many cases CT scan also be part of the early stroke recovery syndrome.   Plan:  Treatment plan and additional workup :  Split night polysomnography for this patient, if available capnography .  Rv with me or Dr. Erlinda Hong depending on outcome of the sleep study.  [facility] patient .   Asencion Partridge Devlin Brink MD  04/05/2015   CC: Anda Kraft, Md [address] [place], [place] [address]

## 2015-04-10 ENCOUNTER — Ambulatory Visit: Payer: 59 | Attending: Internal Medicine | Admitting: Physical Therapy

## 2015-04-10 ENCOUNTER — Encounter: Payer: Self-pay | Admitting: Physical Therapy

## 2015-04-10 DIAGNOSIS — R29818 Other symptoms and signs involving the nervous system: Secondary | ICD-10-CM | POA: Insufficient documentation

## 2015-04-10 DIAGNOSIS — R279 Unspecified lack of coordination: Secondary | ICD-10-CM | POA: Diagnosis present

## 2015-04-10 DIAGNOSIS — R2689 Other abnormalities of gait and mobility: Secondary | ICD-10-CM

## 2015-04-10 DIAGNOSIS — Z7409 Other reduced mobility: Secondary | ICD-10-CM | POA: Insufficient documentation

## 2015-04-10 DIAGNOSIS — IMO0002 Reserved for concepts with insufficient information to code with codable children: Secondary | ICD-10-CM

## 2015-04-10 DIAGNOSIS — R6889 Other general symptoms and signs: Secondary | ICD-10-CM | POA: Diagnosis present

## 2015-04-10 DIAGNOSIS — I69898 Other sequelae of other cerebrovascular disease: Secondary | ICD-10-CM | POA: Insufficient documentation

## 2015-04-10 DIAGNOSIS — H814 Vertigo of central origin: Secondary | ICD-10-CM

## 2015-04-10 DIAGNOSIS — H8149 Vertigo of central origin, unspecified ear: Secondary | ICD-10-CM | POA: Insufficient documentation

## 2015-04-10 DIAGNOSIS — R2681 Unsteadiness on feet: Secondary | ICD-10-CM | POA: Insufficient documentation

## 2015-04-10 DIAGNOSIS — I6980 Unspecified sequelae of other cerebrovascular disease: Secondary | ICD-10-CM | POA: Diagnosis present

## 2015-04-10 DIAGNOSIS — R531 Weakness: Secondary | ICD-10-CM | POA: Diagnosis present

## 2015-04-10 DIAGNOSIS — R269 Unspecified abnormalities of gait and mobility: Secondary | ICD-10-CM | POA: Insufficient documentation

## 2015-04-11 NOTE — Therapy (Signed)
[facility] [address] [place], [place], [address] Phone: [phone]   Fax:  [phone]  Physical Therapy Evaluation  Patient Details  Name: Adrian Neal MRN: [id number] Date of Birth: 09/13/1965 Referring Provider: Oren Binet, MD  Encounter Date: 04/10/2015      PT End of Session - 04/10/15 0930    Visit Number 1   Number of Visits 11   Authorization Type current [employer] compass but reports beginning of year standard [facility]   PT Start Time [phone]   PT Stop Time 0930   PT Time Calculation (min) 44 min   Equipment Utilized During Treatment Gait belt   Activity Tolerance Patient tolerated treatment well   Behavior During Therapy [facility] for tasks assessed/performed      Past Medical History  Diagnosis Date  . Hypertension   . Diabetes mellitus without complication ([place])     diagnosed at age 61  . 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 Stroke ([place])   . Eye problems   . Chronic kidney disease   . High cholesterol     Past Surgical History  Procedure Laterality Date  . Eye surgery  1990    for retinopathy   . Cataract extraction w/ intraocular lens implant  1994    There were no vitals filed for this visit.  Visit Diagnosis:  Abnormality of gait  Vertigo, central origin, unspecified laterality  Lack of coordination due to stroke  Weakness due to cerebrovascular accident  Decreased functional activity tolerance  Unsteadiness  Balance problems      Subjective Assessment - 04/10/15 0853    Subjective This 50yo male had 2nd CVA (left pontine infarct) on ~01/17/2015 with hospitalization 01/19/2015 - 01/20/2015. He was referred to PT for evaluation.    Patient Stated Goals Walk better. Keep balance better.   Currently in Pain? No/denies            [facility] PT Assessment - 04/10/15 0845    Assessment   Medical Diagnosis CVA   Referring Provider Oren Binet, MD   Onset Date/Surgical Date 01/17/15   Precautions   Precautions Fall   Restrictions   Weight Bearing Restrictions No   Balance Screen   Has the patient fallen in the past 6 months No   Has the patient had a decrease in activity level because of a fear of falling?  No   Is the patient reluctant to leave their home because of a fear of falling?  No   Home Environment   Living Environment Private residence   Living Arrangements Spouse/significant other;Children   Type of [place] to enter   Entrance Stairs-Number of Steps 10   Entrance Stairs-Rails None   Home Layout Two level;1/2 bath on main level   Alternate Level Stairs-Number of Steps 14   Alternate Level Stairs-Rails Left;Right  1st 4 steps on left, last 10 steps on right   Prior Function   Level of Independence Independent;Independent with household mobility without device;Independent with community mobility with device  cane initiated with first CVA March 2016   Observation/Other Assessments   Focus on Therapeutic Outcomes (FOTO)  50 Functional Status  56 Risk Adjusted   Neuro Quality of Life  35.9%   Fear Avoidance Belief Questionnaire (FABQ)  26 (8)   Coordination   Gross Motor Movements are Fluid and Coordinated Yes   Heel Shin Test slow but no dysmetra  Posture/Postural Control   Posture/Postural Control Postural limitations   Postural Limitations Rounded Shoulders;Forward head;Flexed trunk;Weight shift right   Tone   Assessment Location Right Lower Extremity;Left Lower Extremity   ROM / Strength   AROM / PROM / Strength AROM;Strength   AROM   Overall AROM  Within functional limits for tasks performed   Strength   Overall Strength Within functional limits for tasks performed   Overall Strength Comments tested grossly in sitting with no noted weakness   Transfers   Transfers Sit to Stand;Stand to Sit   Sit to Stand 6: Modified independent (Device/Increase time);Without upper extremity  assist;From chair/3-in-1   Stand to Sit 6: Modified independent (Device/Increase time);Without upper extremity assist;To chair/3-in-1   Ambulation/Gait   Ambulation/Gait Yes   Ambulation/Gait Assistance 5: Supervision   Ambulation/Gait Assistance Details tested household no device & community with cane   Ambulation Distance (Feet) 200 Feet   Assistive device Straight cane;None  cane for community activities & household no device   Gait Pattern Step-through pattern;Decreased step length - right;Decreased stance time - left;Decreased stride length;Decreased hip/knee flexion - left;Decreased dorsiflexion - left;Left hip hike;Left circumduction;Left flexed knee in stance;Lateral hip instability;Trunk flexed;Abducted - left;Poor foot clearance - left   Ambulation Surface Indoor;Level   Gait velocity 2.61 ft/sec   Stairs Yes   Stairs Assistance 5: Supervision   Stair Management Technique One rail Right;One rail Left;Alternating pattern;Forwards  increased time,    Number of Stairs 4  2 reps   Ramp 4: Min assist  with straight cane   Curb 4: Min assist  with straight cane   Standardized Balance Assessment   Standardized Balance Assessment Berg Balance Test;Timed Up and Go Test;Vestibular Evaluation   Berg Balance Test   Sit to Stand Able to stand without using hands and stabilize independently   Standing Unsupported Able to stand safely 2 minutes   Sitting with Back Unsupported but Feet Supported on Floor or Stool Able to sit safely and securely 2 minutes   Stand to Sit Sits safely with minimal use of hands   Transfers Able to transfer safely, minor use of hands   Standing Unsupported with Eyes Closed Able to stand 10 seconds with supervision   Standing Ubsupported with Feet Together Able to place feet together independently and stand for 1 minute with supervision   From Standing, Reach Forward with Outstretched Arm Can reach forward >12 cm safely (5")   From Standing Position, Pick up  Object from Floor Able to pick up shoe, needs supervision   From Standing Position, Turn to Look Behind Over each Shoulder Looks behind one side only/other side shows less weight shift   Turn 360 Degrees Needs close supervision or verbal cueing   Standing Unsupported, Alternately Place Feet on Step/Stool Able to complete >2 steps/needs minimal assist   Standing Unsupported, One Foot in Front Needs help to step but can hold 15 seconds   Standing on One Leg Tries to lift leg/unable to hold 3 seconds but remains standing independently   Total Score 39   Timed Up and Go Test   Normal TUG (seconds) 14.43  14.43sec with cane, 16.33sec no device   Functional Gait  Assessment   Gait assessed  Yes   Gait Level Surface Walks 20 ft, slow speed, abnormal gait pattern, evidence for imbalance or deviates 10-15 in outside of the 12 in walkway width. Requires more than 7 sec to ambulate 20 ft.   Change in Gait Speed Makes only minor  adjustments to walking speed, or accomplishes a change in speed with significant gait deviations, deviates 10-15 in outside the 12 in walkway width, or changes speed but loses balance but is able to recover and continue walking.   Gait with Horizontal Head Turns Performs head turns with moderate changes in gait velocity, slows down, deviates 10-15 in outside 12 in walkway width but recovers, can continue to walk.   Gait with Vertical Head Turns Performs task with moderate change in gait velocity, slows down, deviates 10-15 in outside 12 in walkway width but recovers, can continue to walk.   Gait and Pivot Turn Turns slowly, requires verbal cueing, or requires several small steps to catch balance following turn and stop   Step Over Obstacle Is able to step over one shoe box (4.5 in total height) but must slow down and adjust steps to clear box safely. May require verbal cueing.   Gait with Narrow Base of Support Ambulates less than 4 steps heel to toe or cannot perform without  assistance.   Gait with Eyes Closed Walks 20 ft, slow speed, abnormal gait pattern, evidence for imbalance, deviates 10-15 in outside 12 in walkway width. Requires more than 9 sec to ambulate 20 ft.   Ambulating Backwards Walks 20 ft, slow speed, abnormal gait pattern, evidence for imbalance, deviates 10-15 in outside 12 in walkway width.   Steps Alternating feet, must use rail.   Total Score 10   RLE Tone   RLE Tone Within Functional Limits   LLE Tone   LLE Tone Within Functional Limits            Vestibular Assessment - 04/10/15 0845    Symptom Behavior   Type of Dizziness Lightheadedness   Frequency of Dizziness with head & body turns   Duration of Dizziness ~1 minute   Aggravating Factors Looking up to the ceiling;Turning body quickly;Turning head quickly   Relieving Factors Rest;Slow movements                         PT Short Term Goals - 04/10/15 0935    PT SHORT TERM GOAL #1   Title Patient demonstrates understanding of updated HEP for balance & mobility (Target Date: 05/10/2015)   Time 4   Period Weeks   Status New   PT SHORT TERM GOAL #2   Title Timed Up & Go without device <14.5 sec to decrease fall risk  (Target Date: 05/10/2015)   Time 4   Period Weeks   Status New   PT SHORT TERM GOAL #3   Title Berg Balance >/= 4356 to indicate lower fall risk  (Target Date: 05/10/2015)   Time 4   Period Weeks   Status New           PT Long Term Goals - 04/10/15 0930    PT LONG TERM GOAL #1   Title Patient verbalizes & demonstrates understanding of ongoing HEP / fitness plan.  (Target Date: 06/21/2015)   Time 10   Period Weeks   Status New   PT LONG TERM GOAL #2   Title The patient will improve neuro quality of life by 10% (baseline 35.9%) to demo improved self perception of mobility.  (Target Date: 06/21/2015)   Time 10   Period Weeks   Status New   PT LONG TERM GOAL #3   Title The patient will improve Berg balance score from 39/56 up to 47/56 to  demo decreasing risk for  falls.  (Target Date: 06/21/2015)   Time 10   Period Weeks   Status New   PT LONG TERM GOAL #4   Title Timed Up & Go without device <13.5 sec to decrease fall risk  (Target Date: 06/21/2015)   Time 10   Period Weeks   Status New   PT LONG TERM GOAL #5   Title Functional Gait Assessment improves from 10/30 to >/= 16/30 to indicate lower fall risk  (Target Date: 06/21/2015)   Time 10   Period Weeks   Status New               Plan - 04-21-15 0930    Clinical Impression Statement This 48yo male had a second CVA this year with further decline in mobility and increased fall risk. Timed Up & Go with cane (community activities) of 14.43 sec and with no device (household activities) 16.33sec both indicate fall risk. Berg Balance score of 39/56 indicates high fall risk for standing activities. He has gait deviations increasing risk of falls and when performing gait tasks including scanning and negotiating obstacles in Functional Gait Assessment (score 10/30) indicates fall risk with gait.    Pt will benefit from skilled therapeutic intervention in order to improve on the following deficits Abnormal gait;Decreased activity tolerance;Decreased balance;Decreased mobility;Postural dysfunction   Rehab Potential Good   PT Frequency 1x / week   PT Duration --  10 weeks   PT Treatment/Interventions ADLs/Self Care Home Management;DME Instruction;Gait training;Stair training;Functional mobility training;Therapeutic activities;Therapeutic exercise;Balance training;Neuromuscular re-education;Patient/family education;Vestibular   PT Next Visit Plan check current HEP & update, balance & gait training   Consulted and Agree with Plan of Care Patient          G-Codes - 21-Apr-2015 0930    Functional Assessment Tool Used Functional Gait Assessment 10/30   Functional Limitation Mobility: Walking and moving around   Mobility: Walking and Moving Around Current Status [phone]) At least 60  percent but less than 80 percent impaired, limited or restricted   Mobility: Walking and Moving Around Goal Status [phone]) At least 40 percent but less than 60 percent impaired, limited or restricted       Problem List Patient Active Problem List   Diagnosis Date Noted  . OSA (obstructive sleep apnea) 03/29/2015  . Type 2 diabetes mellitus with other circulatory complications ([place]) [id number]  . Stroke ([place])   . Hypothyroidism 01/19/2015  . HLD (hyperlipidemia) 11/22/2014  . 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 Abnormal LFTs 08/12/2014  . Ataxia   . CVA (cerebral infarction) 08/03/2014  . ARF (acute renal failure) ([place])   . Insulin dependent diabetes mellitus ([place])   . Cerebral thrombosis with cerebral infarction ([place]) 07/28/2014  . Intractable nausea and vomiting 07/27/2014  . Vertigo 07/27/2014  . Essential hypertension 07/27/2014  . Type 1 diabetes mellitus ([place]) 05/29/2010  . Hyperlipidemia 05/29/2010  . Benign essential HTN 05/29/2010  . 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 [place], [place], [address] Phone: [phone]   Fax:  [phone]  Name: Adrian Neal MRN: [id number] Date of Birth: [birth year]

## 2015-04-17 ENCOUNTER — Ambulatory Visit: Payer: 59 | Admitting: Physical Therapy

## 2015-04-17 ENCOUNTER — Encounter: Payer: Self-pay | Admitting: Physical Therapy

## 2015-04-17 DIAGNOSIS — R6889 Other general symptoms and signs: Secondary | ICD-10-CM

## 2015-04-17 DIAGNOSIS — R2689 Other abnormalities of gait and mobility: Secondary | ICD-10-CM

## 2015-04-17 DIAGNOSIS — R2681 Unsteadiness on feet: Secondary | ICD-10-CM

## 2015-04-17 DIAGNOSIS — IMO0002 Reserved for concepts with insufficient information to code with codable children: Secondary | ICD-10-CM

## 2015-04-17 DIAGNOSIS — R269 Unspecified abnormalities of gait and mobility: Secondary | ICD-10-CM | POA: Diagnosis not present

## 2015-04-17 DIAGNOSIS — H814 Vertigo of central origin: Secondary | ICD-10-CM

## 2015-04-17 NOTE — Therapy (Signed)
[facility] [address] [place], [place], [address] Phone: [phone]   Fax:  [phone]  Physical Therapy Treatment  Patient Details  Name: Adrian Neal MRN: [id number] Date of Birth: [birth year] Referring Provider: Oren Binet, MD  Encounter Date: 04/17/2015      PT End of Session - 04/17/15 1107    Visit Number 2   Number of Visits 11   Authorization Type current [employer] compass but reports beginning of year standard [employer]   PT Start Time 1102   PT Stop Time 1145   PT Time Calculation (min) 43 min   Equipment Utilized During Treatment Gait belt   Activity Tolerance Patient tolerated treatment well   Behavior During Therapy [facility] for tasks assessed/performed      Past Medical History  Diagnosis Date  . Hypertension   . Diabetes mellitus without complication (Adrian Neal)     diagnosed at age 89  . Retinopathy due to secondary diabetes mellitus (Adrian Neal)     right  . Stroke (Adrian Neal)   . Eye problems   . Chronic kidney disease   . High cholesterol     Past Surgical History  Procedure Laterality Date  . Eye surgery  1990    for retinopathy   . Cataract extraction w/ intraocular lens implant  1994    There were no vitals filed for this visit.  Visit Diagnosis:  Abnormality of gait  Lack of coordination due to stroke  Weakness due to cerebrovascular accident  Vertigo, central origin, unspecified laterality  Decreased functional activity tolerance  Unsteadiness  Balance problems      Subjective Assessment - 04/17/15 1107    Subjective No new complaints.  No pain or falls.    Currently in Pain? No/denies     Treatment: Current HEP:  Treadmill, some balance activities at sofa (single leg stance, turning in place). Also, still walking outside as weather permits. Now limited in distance since second CVA.   Neuro Re-ed: added below to HEP In corner: min guard with occasional UE touch to wall/chair for  balance, cues on posture and weight shifting for balance Narrow base of support on floor Eyes closed no head movements, eyes closed head nods/shakes/diagonals both ways  Wide base of support on pillows Eyes closed no head movements, eyes closed head nods/shakes/diagonals both ways  In hallway; min guard assist with cues on maintaining gait speed and path Gait with head turns/nods/diagonals both ways          PT Education - 04/17/15 1141    Education provided Yes   Education Details HEP: corner balance with eyes closed with feet together on floor, feet apart on pillows and gait with head turns/nods/diagonals in carpeted hallway   Person(s) Educated Patient   Methods Explanation;Demonstration;Handout   Comprehension Verbalized understanding;Returned demonstration;Need further instruction          PT Short Term Goals - 04/10/15 0935    PT SHORT TERM GOAL #1   Title Patient demonstrates understanding of updated HEP for balance & mobility (Target Date: 05/10/2015)   Time 4   Period Weeks   Status New   PT SHORT TERM GOAL #2   Title Timed Up & Go without device <14.5 sec to decrease fall risk  (Target Date: 05/10/2015)   Time 4   Period Weeks   Status New   PT SHORT TERM GOAL #3   Title Berg Balance >/= 4356 to indicate lower fall risk  (Target Date: 05/10/2015)  Time 4   Period Weeks   Status New           PT Long Term Goals - 04/10/15 0930    PT LONG TERM GOAL #1   Title Patient verbalizes & demonstrates understanding of ongoing HEP / fitness plan.  (Target Date: 06/21/2015)   Time 10   Period Weeks   Status New   PT LONG TERM GOAL #2   Title The patient will improve neuro quality of life by 10% (baseline 35.9%) to demo improved self perception of mobility.  (Target Date: 06/21/2015)   Time 10   Period Weeks   Status New   PT LONG TERM GOAL #3   Title The patient will improve Berg balance score from 39/56 up to 47/56 to demo decreasing risk for falls.  (Target  Date: 06/21/2015)   Time 10   Period Weeks   Status New   PT LONG TERM GOAL #4   Title Timed Up & Go without device <13.5 sec to decrease fall risk  (Target Date: 06/21/2015)   Time 10   Period Weeks   Status New   PT LONG TERM GOAL #5   Title Functional Gait Assessment improves from 10/30 to >/= 16/30 to indicate lower fall risk  (Target Date: 06/21/2015)   Time 10   Period Weeks   Status New           Plan - 04/17/15 1108    Clinical Impression Statement Advanced HEP for balance at home without any issues reported in session today. Pt making progress toward goals.   Pt will benefit from skilled therapeutic intervention in order to improve on the following deficits Abnormal gait;Decreased activity tolerance;Decreased balance;Decreased mobility;Postural dysfunction   Rehab Potential Good   PT Frequency 1x / week   PT Duration --  10 weeks   PT Treatment/Interventions ADLs/Self Care Home Management;DME Instruction;Gait training;Stair training;Functional mobility training;Therapeutic activities;Therapeutic exercise;Balance training;Neuromuscular re-education;Patient/family education;Vestibular   PT Next Visit Plan continue toward STG's working on balance and gait   Consulted and Agree with Plan of Care Patient        Problem List Patient Active Problem List   Diagnosis Date Noted  . OSA (obstructive sleep apnea) 03/29/2015  . Type 2 diabetes mellitus with other circulatory complications ([place]) 99991111  . Stroke ([place])   . Hypothyroidism 01/19/2015  . HLD (hyperlipidemia) 11/22/2014  . Cerebral infarction due to thrombosis of basilar artery ([place]) 11/22/2014  . Abnormal LFTs 08/12/2014  . Ataxia   . CVA (cerebral infarction) 08/03/2014  . ARF (acute renal failure) ([place])   . Insulin dependent diabetes mellitus ([place])   . Cerebral thrombosis with cerebral infarction ([place]) 07/28/2014  . Intractable nausea and vomiting 07/27/2014  . Vertigo 07/27/2014  . Essential  hypertension 07/27/2014  . Type 1 diabetes mellitus ([place]) 05/29/2010  . Hyperlipidemia 05/29/2010  . Benign essential HTN 05/29/2010  . PLANTAR FASCIITIS 05/29/2010    Adrian Neal 04/18/2015, 2:46 PM  Adrian Neal, PTA, [facility] [address], [place], [place] [address] [phone] 04/18/2015, 2:47 PM   Name: PJ BARTSCHI MRN: [id number] Date of Birth: Nov 24, 1965

## 2015-04-17 NOTE — Patient Instructions (Signed)
Feet Together, Head Motion - Eyes Closed    In corner with chair in front of you for safety: With eyes closed and feet together: 1. Move head slowly, up and down. 2. Move head slowly, left and right 3. Move head slowly, diagonals both ways Repeat _10__ times per session. Do _1-2__ sessions per day.  Copyright  VHI. All rights reserved.  Feet Apart (Compliant Surface) Head Motion - Eyes Closed    In corner with chair in front for safety:Stand on compliant surface: pillows with feet shoulder width apart. Close eyes: 1. Move head slowly, up and down. 2. Move head slowly, left and right 3. Move head slowly, diagonals both ways Repeat _10_ times per session. Do _1-2_ sessions per day.  Copyright  VHI. All rights reserved.  Carpeted Surface With Side to Side Head Motion    Perform without assistive device. Walking on carpet, turn head and eyes left for _2-3___ steps.  . Then, turn head and eyes to opposite side for _2-3___ steps. Repeat sequence __4__ times per session. Do __1-2__ sessions per day.  Copyright  VHI. All rights reserved.  Carpeted Surface With Up / Down Head Motion    Perform without assistive device. Walking on carpet, move head and eyes toward ceiling for _2-3___ steps. Then, move head and eyes toward floor for __2-3__ steps. Repeat sequence __4__ times per session. Do _1-2___ sessions per day.   Copyright  VHI. All rights reserved.  Carpeted Surface With Diagonal Head Motion    Perform with assistive device. Walking on carpet, move head and eyes up to left for 2-3__ steps. Then, move head and eyes down to opposite side for _2-3___ steps. Repeat sequence __2__ times per session. Do __1-2__ sessions per day.   Copyright  VHI. All rights reserved.  Carpeted Surface With Diagonal Head Motion    Perform with assistive device. Walking on carpet, move head and eyes up to right for _2-3__ steps.  Then, move head and eyes down to opposite side for _2-3___  steps. Repeat sequence __2__ times per session. Do _1-2___ sessions per day.  Copyright  VHI. All rights reserved.

## 2015-04-19 ENCOUNTER — Encounter: Payer: Self-pay | Admitting: Physical Medicine & Rehabilitation

## 2015-04-19 ENCOUNTER — Encounter: Payer: 59 | Attending: Physical Medicine & Rehabilitation | Admitting: Physical Medicine & Rehabilitation

## 2015-04-19 VITALS — BP 151/78 | HR 80 | Resp 14

## 2015-04-19 DIAGNOSIS — I10 Essential (primary) hypertension: Secondary | ICD-10-CM

## 2015-04-19 DIAGNOSIS — E11319 Type 2 diabetes mellitus with unspecified diabetic retinopathy without macular edema: Secondary | ICD-10-CM | POA: Diagnosis not present

## 2015-04-19 DIAGNOSIS — I129 Hypertensive chronic kidney disease with stage 1 through stage 4 chronic kidney disease, or unspecified chronic kidney disease: Secondary | ICD-10-CM | POA: Insufficient documentation

## 2015-04-19 DIAGNOSIS — Z794 Long term (current) use of insulin: Secondary | ICD-10-CM | POA: Diagnosis not present

## 2015-04-19 DIAGNOSIS — R42 Dizziness and giddiness: Secondary | ICD-10-CM | POA: Diagnosis not present

## 2015-04-19 DIAGNOSIS — Z87891 Personal history of nicotine dependence: Secondary | ICD-10-CM | POA: Insufficient documentation

## 2015-04-19 DIAGNOSIS — R270 Ataxia, unspecified: Secondary | ICD-10-CM | POA: Diagnosis not present

## 2015-04-19 DIAGNOSIS — H8143 Vertigo of central origin, bilateral: Secondary | ICD-10-CM | POA: Diagnosis not present

## 2015-04-19 DIAGNOSIS — Z8673 Personal history of transient ischemic attack (TIA), and cerebral infarction without residual deficits: Secondary | ICD-10-CM | POA: Diagnosis not present

## 2015-04-19 DIAGNOSIS — I6302 Cerebral infarction due to thrombosis of basilar artery: Secondary | ICD-10-CM | POA: Diagnosis present

## 2015-04-19 NOTE — Progress Notes (Signed)
Subjective:    Patient ID: Adrian Neal, male    DOB: [birth year], 49 y.o.   MRN: [id number]  HPI   Adrian Neal is here in follow up of his pontine infarct. I placed him on a scopolamine patch last month for his ongoing vestibular symptoms. He thinks it helped somewhat. His equilibrium is still "off". He has days where he feels better and days where he feels worse. It doesn't coincide with his BP or sugars. His glucose control is improving but still not where he wants it to be.   He has applied for disability.     Pain Inventory Average Pain 0 Pain Right Now 0 My pain is NA  In the last 24 hours, has pain interfered with the following? General activity 0 Relation with others 0 Enjoyment of life 0 What TIME of day is your pain at its worst? NA Sleep (in general) Good  Pain is worse with: NA Pain improves with: NA Relief from Meds: 0  Mobility use a cane how many minutes can you walk? 30 ability to climb steps?  yes do you drive?  no transfers alone Do you have any goals in this area?  yes  Function disabled: date disabled 07/27/14 Do you have any goals in this area?  yes  Neuro/Psych weakness numbness trouble walking loss of taste or smell  Prior Studies Any changes since last visit?  no CT/MRI  Physicians involved in your care Any changes since last visit?  no Primary care [facility]   Family History  Problem Relation Age of Onset  . Hypertension Mother   . Hyperlipidemia Mother   . Hyperlipidemia Father   . Hypertension Father   . Diabetes Father   . Stroke Brother   . Diabetes Brother    Social History   Social History  . Marital Status: Married    Spouse Name: N/A  . Number of Children: N/A  . Years of Education: N/A   Social History Main Topics  . Smoking status: Former Smoker -- 1.00 packs/day for 6 years    Quit date: 04/30/1991  . Smokeless tobacco: None  . Alcohol Use: No  . Drug Use: No  . Sexual Activity: Not Asked    Other Topics Concern  . None   Social History Narrative   Past Surgical History  Procedure Laterality Date  . Eye surgery  1990    for retinopathy   . Cataract extraction w/ intraocular lens implant  1994   Past Medical History  Diagnosis Date  . Hypertension   . Diabetes mellitus without complication ([place])     diagnosed at age 80  . Retinopathy due to secondary diabetes mellitus ([place])     right  . Stroke ([place])   . Eye problems   . Chronic kidney disease   . High cholesterol    BP 151/78 mmHg  Pulse 80  Resp 14  SpO2 98%  Opioid Risk Score:   Fall Risk Score:  `1  Depression screen PHQ 2/9  Depression screen PHQ 2/9 09/19/2014  Decreased Interest 0  Down, Depressed, Hopeless 0  PHQ - 2 Score 0  Altered sleeping 0  Tired, decreased energy 0  Change in appetite 0  Feeling bad or failure about yourself  0  Trouble concentrating 0  Moving slowly or fidgety/restless 0  Suicidal thoughts 0  PHQ-9 Score 0     Review of Systems  Constitutional:       Loss of taste  or smell  Respiratory: Positive for apnea.   Musculoskeletal: Positive for gait problem.  Neurological: Positive for weakness and numbness.  All other systems reviewed and are negative.      Objective:   Physical Exam  Constitutional: He is oriented to person, place, and time. He appears well-developed and well-nourished. comfortable  HENT: oral mucosa pink and moist  Head: Normocephalic and atraumatic.  Eyes: Conjunctivae are normal. Pupils are equal, round, and reactive to light.  Neck: Normal range of motion. Neck supple.  Cardiovascular: Normal rate and regular rhythm. no murmur  Respiratory: Effort normal and breath sounds normal. No respiratory distress. He has no wheezes. He exhibits no tenderness.  GI: Soft. Bowel sounds are normal. He exhibits no distension. There is no tenderness.  Musculoskeletal: He exhibits no edema or tenderness.  Neurological: He is alert and oriented to person,  place, and time.  Strength nearly 5/5 to 5/5 in all 4's. Speech clear and articulate. Follows commands without difficulty. Mild Ataxia with left finger to nose is mild but persistent. Romberg positive 4-5 beats of nystagmus with gaze to right, 2 beats to the left. Continues to squint with right eye to improve visual acuity on that side. Leans to right with gait, tends to drift to that side. Has dificulty with changing directions.  Psychiatric: Calm and appropriate  Skin: Skin is warm and dry.  Psychiatric: He has a normal mood and affect. His speech is normal and behavior is normal. Judgment and thought content normal. Cognition and memory are normal.    Assessment/Plan:  1. Functional deficits secondary to left pontine infarct  -scopolamine patch for central vertigo to allow increase activity tolerance  -he is not ready to return to his prior job given ongoing balance/coordination issues   - he remains pre-vocational for even a sedentary job    2. Pain Management: N/A  3. CKD: per pcp  4. DM type 1: needs to seek better control. Insulin pump adjustment per endocrine. This should be his number one priority at this point  I'll see him back in about 4 months. Thirty minutes of face to face patient care time were spent during this visit. All questions were encouraged and answered.

## 2015-04-19 NOTE — Patient Instructions (Signed)
PLEASE CALL ME WITH ANY PROBLEMS OR QUESTIONS (#[phone]). HAVE A HAPPY HOLIDAY SEASON!!!    

## 2015-04-26 ENCOUNTER — Ambulatory Visit: Payer: 59 | Admitting: Physical Therapy

## 2015-04-26 ENCOUNTER — Encounter: Payer: Self-pay | Admitting: Physical Therapy

## 2015-04-26 DIAGNOSIS — R2681 Unsteadiness on feet: Secondary | ICD-10-CM

## 2015-04-26 DIAGNOSIS — R2689 Other abnormalities of gait and mobility: Secondary | ICD-10-CM

## 2015-04-26 DIAGNOSIS — R531 Weakness: Secondary | ICD-10-CM

## 2015-04-26 DIAGNOSIS — R269 Unspecified abnormalities of gait and mobility: Secondary | ICD-10-CM

## 2015-04-26 DIAGNOSIS — IMO0002 Reserved for concepts with insufficient information to code with codable children: Secondary | ICD-10-CM

## 2015-04-26 DIAGNOSIS — Z7409 Other reduced mobility: Secondary | ICD-10-CM

## 2015-04-26 DIAGNOSIS — R6889 Other general symptoms and signs: Secondary | ICD-10-CM

## 2015-04-26 NOTE — Patient Instructions (Signed)
Fitness Plan has 4 components.  1. Endurance - Recommend machines that are sitting with back support that uses both your arms and your legs.Or can do walking program Goal is 20-30 minutes. 2. Strength - weight machines enough weight to have resistance but not so much that you have to strain or cheat.  Goal is to do 15 repetitions for 2-3 sets. Rest 30-60 seconds between sets.  Do 2-4 leg machines, 2-4 arm machines & 2-4 trunk machines. Look for pictures to make sure you exercise both sides of arm, leg or trunk.  Leg machines like leg press (can do both legs or each leg by themselves),   At home can do theraband exercises for arms or legs, floor transfers, sit to stand to sit using arms as little as possible, Yoga positions 3.Flexibility - make sure you include arms, legs & trunk. Can do Yoga also 4. Balance- can work in corner with chair in front - head turns, arm motions, eyes closed; place one foot inside cabinet or on 4-6" block to work on one-legged stance,   Try to get to each component 3-5 times per week.  Going to [facility] or fitness center you want do things you can not do at home.  For example use bikes at [facility] if you don't have one at home. Then on days you don't go to [facility], do exercises at home.  Feet Partial Heel-Toe, Head Motion - Eyes Open    With eyes open, right foot partially in front of the other, move head slowly: right/left, up/down, diagonals. Repeat __10__ times each head motion per session.   Copyright  VHI. All rights reserved.  Feet Together, Head Motion - Eyes Closed    With eyes closed and feet together, move head slowly, right/left, up/down, diagonals. Repeat __10__ times each direction per session.   Copyright  VHI. All rights reserved.  Feet Together (Compliant Surface) Head Motion - Eyes Open    With eyes open, standing on compliant surface: ___pillow_____, feet together, move head slowly: right/left, up/down, diagonals. Repeat __10__ times each  direction per session.  Copyright  VHI. All rights reserved.  Feet Apart (Compliant Surface) Head Motion - Eyes Closed    Stand on compliant surface: ___pillow_____ with feet shoulder width apart. Close eyes and move head slowly, right/left, up/down, diagonals. Repeat __10__ times each direction per session.   Copyright  VHI. All rights reserved.

## 2015-04-26 NOTE — Therapy (Signed)
[facility] [address] [place], [place], [address] Phone: [phone]   Fax:  [phone]  Physical Therapy Treatment  Patient Details  Name: Adrian Neal MRN: [id number] Date of Birth: Mar 21, 1966 Referring Provider: Oren Binet, MD  Encounter Date: 04/26/2015      PT End of Session - 04/26/15 1145    Visit Number 3   Number of Visits 11   Authorization Type current UHC compass but reports beginning of year standard UHC   PT Start Time 1100   PT Stop Time 1145   PT Time Calculation (min) 45 min   Equipment Utilized During Treatment Gait belt   Activity Tolerance Patient tolerated treatment well   Behavior During Therapy [facility] for tasks assessed/performed      Past Medical History  Diagnosis Date  . Hypertension   . Diabetes mellitus without complication ([place])     diagnosed at age 59  . Retinopathy due to secondary diabetes mellitus ([place])     right  . Stroke ([place])   . Eye problems   . Chronic kidney disease   . High cholesterol     Past Surgical History  Procedure Laterality Date  . Eye surgery  1990    for retinopathy   . Cataract extraction w/ intraocular lens implant  1994    There were no vitals filed for this visit.  Visit Diagnosis:  Abnormality of gait  Weakness due to cerebrovascular accident  Lack of coordination due to stroke  Decreased functional activity tolerance  Unsteadiness  Balance problems  Decreased strength, endurance, and mobility      Subjective Assessment - 04/26/15 1100    Subjective No falls. Exercises are going well.    Currently in Pain? No/denies     Neuromuscular Re-education: balance & proprioception in corner as HEP head movements in 4 directions: floor EO with partial step, Floor EC feet together, foam EO feet together, foam EC feet apart.   Gait Training: initially on Treadmill (also to assess safety as he reports use at home) 2.43mph: worked on  releasing single UE with focus on arm swing, head turns to scan right/left & up/down with BUE support and increasing speed with longer & quicker steps (used gait trainer program for biofeedback).  Followed treadmill with overground work of same skills to facilitate better carryover. PT instructed pt in need for overground follow-up to treadmill activities to improve carryover.                             PT Education - 04/26/15 1145    Education provided Yes   Education Details updated HEP in corner, Treadmill gait activities, components of fitness plan.   Person(s) Educated Patient   Methods Explanation;Demonstration;Tactile cues;Verbal cues;Handout   Comprehension Verbalized understanding;Returned demonstration;Verbal cues required;Tactile cues required;Need further instruction          PT Short Term Goals - 04/10/15 0935    PT SHORT TERM GOAL #1   Title Patient demonstrates understanding of updated HEP for balance & mobility (Target Date: 05/10/2015)   Time 4   Period Weeks   Status New   PT SHORT TERM GOAL #2   Title Timed Up & Go without device <14.5 sec to decrease fall risk  (Target Date: 05/10/2015)   Time 4   Period Weeks   Status New   PT SHORT TERM GOAL #3   Title Oceanographer >/= 4356 to indicate  lower fall risk  (Target Date: 05/10/2015)   Time 4   Period Weeks   Status New           PT Long Term Goals - 04/10/15 0930    PT LONG TERM GOAL #1   Title Patient verbalizes & demonstrates understanding of ongoing HEP / fitness plan.  (Target Date: 06/21/2015)   Time 10   Period Weeks   Status New   PT LONG TERM GOAL #2   Title The patient will improve neuro quality of life by 10% (baseline 35.9%) to demo improved self perception of mobility.  (Target Date: 06/21/2015)   Time 10   Period Weeks   Status New   PT LONG TERM GOAL #3   Title The patient will improve Berg balance score from 39/56 up to 47/56 to demo decreasing risk for falls.   (Target Date: 06/21/2015)   Time 10   Period Weeks   Status New   PT LONG TERM GOAL #4   Title Timed Up & Go without device <13.5 sec to decrease fall risk  (Target Date: 06/21/2015)   Time 10   Period Weeks   Status New   PT LONG TERM GOAL #5   Title Functional Gait Assessment improves from 10/30 to >/= 16/30 to indicate lower fall risk  (Target Date: 06/21/2015)   Time 10   Period Weeks   Status New               Plan - 04/26/15 1145    Clinical Impression Statement Patient appears to understand updated HEP including rationale to advancing feet position based on how many balance systems are assisting. Patient was able to carryover arm swing, scanning & increasing speed in hall with walls for reference, PT cues and supervision. Patient appears safe on treadmill with UE support.    Pt will benefit from skilled therapeutic intervention in order to improve on the following deficits Abnormal gait;Decreased activity tolerance;Decreased balance;Decreased mobility;Postural dysfunction   Rehab Potential Good   PT Frequency 1x / week   PT Duration --  10 weeks   PT Treatment/Interventions ADLs/Self Care Home Management;DME Instruction;Gait training;Stair training;Functional mobility training;Therapeutic activities;Therapeutic exercise;Balance training;Neuromuscular re-education;Patient/family education;Vestibular   PT Next Visit Plan continue toward STG's working on balance and gait   Consulted and Agree with Plan of Care Patient        Problem List Patient Active Problem List   Diagnosis Date Noted  . OSA (obstructive sleep apnea) 03/29/2015  . Type 2 diabetes mellitus with other circulatory complications ([place]) 99991111  . Stroke ([place])   . Hypothyroidism 01/19/2015  . HLD (hyperlipidemia) 11/22/2014  . Cerebral infarction due to thrombosis of basilar artery ([place]) 11/22/2014  . Abnormal LFTs 08/12/2014  . Ataxia   . CVA (cerebral infarction) 08/03/2014  . ARF (acute renal  failure) ([place])   . Insulin dependent diabetes mellitus ([place])   . Cerebral thrombosis with cerebral infarction ([place]) 07/28/2014  . Intractable nausea and vomiting 07/27/2014  . Vertigo 07/27/2014  . Essential hypertension 07/27/2014  . Type 1 diabetes mellitus ([place]) 05/29/2010  . Hyperlipidemia 05/29/2010  . Benign essential HTN 05/29/2010  . PLANTAR FASCIITIS 05/29/2010    Nanako Stopher  PT, DPT  04/26/2015, 1:45 PM  [facility] [address] [place], [place], [address] Phone: [phone]   Fax:  [phone]  Name: Adrian Neal MRN: [id number] Date of Birth: April 04, 1966

## 2015-04-27 ENCOUNTER — Ambulatory Visit (INDEPENDENT_AMBULATORY_CARE_PROVIDER_SITE_OTHER): Payer: 59 | Admitting: Neurology

## 2015-04-27 DIAGNOSIS — G4733 Obstructive sleep apnea (adult) (pediatric): Secondary | ICD-10-CM | POA: Diagnosis not present

## 2015-04-27 DIAGNOSIS — G4730 Sleep apnea, unspecified: Secondary | ICD-10-CM

## 2015-04-27 DIAGNOSIS — I63212 Cerebral infarction due to unspecified occlusion or stenosis of left vertebral arteries: Secondary | ICD-10-CM

## 2015-04-27 DIAGNOSIS — R0683 Snoring: Secondary | ICD-10-CM

## 2015-04-27 DIAGNOSIS — I6381 Other cerebral infarction due to occlusion or stenosis of small artery: Secondary | ICD-10-CM

## 2015-04-28 NOTE — Sleep Study (Signed)
Please see the scanned sleep study interpretation located in the procedure tab within the chart review section.   

## 2015-05-03 ENCOUNTER — Ambulatory Visit: Payer: 59 | Admitting: Physical Therapy

## 2015-05-04 ENCOUNTER — Telehealth: Payer: Self-pay

## 2015-05-04 NOTE — Telephone Encounter (Signed)
Spoke to pt regarding his sleep study results. Pt was advised that osa was seen during his study and that once cpap was applied, cpap seemed to be effective. Dr. Brett Fairy recommended starting an auto cpap. Pt is agreeable to this but he lost his insurance on 04/29/2015 and is in the process of getting a new insurance carrier. Pt states that he will call us when he gets his insurance because he cannot afford the cpap out of pocket at this time. I advised pt to not drive or operative hazardous machinery when sleepy. Pt was advised to tone muscles and increase respiratory volume by exercise. Pt verbalized understanding.

## 2015-05-09 ENCOUNTER — Encounter: Payer: Self-pay | Admitting: Physical Therapy

## 2015-05-09 NOTE — Therapy (Signed)
[facility] [address] [place], [place], [address] Phone: [phone]   Fax:  [phone]  Patient Details  Name: Adrian Neal MRN: [id number] Date of Birth: Oct 30, 1965 Referring Provider:  Oren Binet, MD  Encounter Date: 05/09/2015   PHYSICAL THERAPY DISCHARGE SUMMARY  Visits from Start of Care: 3  Current functional level related to goals / functional outcomes: Patient canceled all PT appointments reporting loss of insurance & financial concerns. Due to limited visits minor changes made & not retested with unexpected discharge.    Remaining deficits: Unknown with unexpected discharge.   Education / Equipment: HEP Plan: Patient agrees to discharge.  Patient goals were not met. Patient is being discharged due to financial reasons.  ?????       Tanee Henery PT, DPT 05/09/2015, 11:46 AM  [facility] [address] [place], [place], [address] Phone: [phone]   Fax:  [phone]

## 2015-05-10 ENCOUNTER — Ambulatory Visit: Payer: 59 | Admitting: Physical Therapy

## 2015-05-10 DIAGNOSIS — Z0271 Encounter for disability determination: Secondary | ICD-10-CM

## 2015-05-17 ENCOUNTER — Ambulatory Visit: Payer: 59 | Admitting: Physical Therapy

## 2015-05-24 ENCOUNTER — Ambulatory Visit: Payer: 59 | Admitting: Physical Therapy

## 2015-05-31 ENCOUNTER — Ambulatory Visit: Payer: 59 | Admitting: Physical Therapy

## 2015-06-07 ENCOUNTER — Ambulatory Visit: Payer: 59 | Admitting: Physical Therapy

## 2015-06-14 ENCOUNTER — Ambulatory Visit: Payer: 59 | Admitting: Physical Therapy

## 2015-06-21 ENCOUNTER — Ambulatory Visit: Payer: 59 | Admitting: Physical Therapy

## 2015-07-31 ENCOUNTER — Ambulatory Visit: Payer: 59 | Admitting: Neurology

## 2015-08-16 ENCOUNTER — Encounter: Payer: Self-pay | Attending: Physical Medicine & Rehabilitation | Admitting: Physical Medicine & Rehabilitation

## 2015-08-16 ENCOUNTER — Encounter: Payer: Self-pay | Admitting: Physical Medicine & Rehabilitation

## 2015-08-16 DIAGNOSIS — R200 Anesthesia of skin: Secondary | ICD-10-CM | POA: Insufficient documentation

## 2015-08-16 DIAGNOSIS — E7800 Pure hypercholesterolemia, unspecified: Secondary | ICD-10-CM | POA: Insufficient documentation

## 2015-08-16 DIAGNOSIS — I129 Hypertensive chronic kidney disease with stage 1 through stage 4 chronic kidney disease, or unspecified chronic kidney disease: Secondary | ICD-10-CM | POA: Insufficient documentation

## 2015-08-16 DIAGNOSIS — Z87891 Personal history of nicotine dependence: Secondary | ICD-10-CM | POA: Insufficient documentation

## 2015-08-16 DIAGNOSIS — I10 Essential (primary) hypertension: Secondary | ICD-10-CM

## 2015-08-16 DIAGNOSIS — Z794 Long term (current) use of insulin: Secondary | ICD-10-CM | POA: Insufficient documentation

## 2015-08-16 DIAGNOSIS — I6302 Cerebral infarction due to thrombosis of basilar artery: Secondary | ICD-10-CM

## 2015-08-16 DIAGNOSIS — E10319 Type 1 diabetes mellitus with unspecified diabetic retinopathy without macular edema: Secondary | ICD-10-CM | POA: Insufficient documentation

## 2015-08-16 DIAGNOSIS — I69398 Other sequelae of cerebral infarction: Secondary | ICD-10-CM | POA: Insufficient documentation

## 2015-08-16 DIAGNOSIS — E785 Hyperlipidemia, unspecified: Secondary | ICD-10-CM | POA: Insufficient documentation

## 2015-08-16 DIAGNOSIS — R42 Dizziness and giddiness: Secondary | ICD-10-CM | POA: Insufficient documentation

## 2015-08-16 NOTE — Patient Instructions (Signed)
YOU NEED TO CONTACT DR. Erlinda Hong

## 2015-08-16 NOTE — Progress Notes (Signed)
Subjective:    Patient ID: Adrian Neal, male    DOB: 05-24-1965, 50 y.o.   MRN: [id number]  HPI   Adrian Neal is back regarding his pontine infarct and associated functional deficits. He feels that he's having more numbness and problems with coordination/strength/balance as well as increased dizziness over the last 3 weeks or so. He cancelled an appt with neurology recently because he had no way to pay the practice.   He states that his sugars and bp have been under reasonable control. His SBP has been slightly elevated over the last few weeks however (150's)  He denies pain. He uses his cane for balance.   Pain Inventory Average Pain 0 Pain Right Now 0 My pain is no   In the last 24 hours, has pain interfered with the following? General activity 0 Relation with others 0 Enjoyment of life 0 What TIME of day is your pain at its worst?  . Sleep (in general) Fair  Pain is worse with: na Pain improves with: no pain Relief from Meds: no pain  Mobility use a walker how many minutes can you walk? 30 ability to climb steps?  yes do you drive?  no  Function disabled: date disabled 07/27/14 I need assistance with the following:  household duties  Neuro/Psych numbness dizziness loss of taste or smell  Prior Studies Any changes since last visit?  no  Physicians involved in your care Any changes since last visit?  no   Family History  Problem Relation Age of Onset  . Hypertension Mother   . Hyperlipidemia Mother   . Hyperlipidemia Father   . Hypertension Father   . Diabetes Father   . Stroke Brother   . Diabetes Brother    Social History   Social History  . Marital Status: Married    Spouse Name: N/A  . Number of Children: N/A  . Years of Education: N/A   Social History Main Topics  . Smoking status: Former Smoker -- 1.00 packs/day for 6 years    Quit date: 04/30/1991  . Smokeless tobacco: None  . Alcohol Use: No  . Drug Use: No  . Sexual Activity: Not  Asked   Other Topics Concern  . None   Social History Narrative   Past Surgical History  Procedure Laterality Date  . Eye surgery  1990    for retinopathy   . Cataract extraction w/ intraocular lens implant  1994   Past Medical History  Diagnosis Date  . Hypertension   . Diabetes mellitus without complication ([place])     diagnosed at age 61  . 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 Stroke ([place])   . Eye problems   . Chronic kidney disease   . High cholesterol    There were no vitals taken for this visit.  Opioid Risk Score:   Fall Risk Score:  `1  Depression screen PHQ 2/9  Depression screen [facility] 2/9 08/16/2015 09/19/2014  Decreased Interest 0 0  Down, Depressed, Hopeless 0 0  PHQ - 2 Score 0 0  Altered sleeping - 0  Tired, decreased energy - 0  Change in appetite - 0  Feeling bad or failure about yourself  - 0  Trouble concentrating - 0  Moving slowly or fidgety/restless - 0  Suicidal thoughts - 0  PHQ-9 Score - 0     Review of Systems  Respiratory: Positive for apnea.   Endocrine:  Blood sugar fluctuations  All other systems reviewed and are negative.      Objective:   Physical Exam  Constitutional: He is oriented to person, place, and time. He appears well-developed and well-nourished. comfortable  HENT: oral mucosa pink and moist  Head: Normocephalic and atraumatic.  Eyes: Conjunctivae are normal. Pupils are equal, round, and reactive to light.  Neck: Normal range of motion. Neck supple.  Cardiovascular: Normal rate and regular rhythm. no murmur  Respiratory: Effort normal and breath sounds normal. No respiratory distress. He has no wheezes. He exhibits no tenderness.  GI: Soft. Bowel sounds are normal. He exhibits no distension. There is no tenderness.  Musculoskeletal: He exhibits no edema or tenderness.  Neurological: He is alert and oriented to person, place, and time.  Strength nearly 5/5 to 5/5 in all 4's. Speech  clear and articulate. Follows commands without difficulty. Mild Ataxia with left finger to nose remains mild. Romberg positive 4-5 beats of nystagmus with gaze to right, 2 beats to the left. Continues to squint with right eye to improve visual acuity on that side. Leans to right with gait, tends to drift to that side slightly. Has dificulty with changing directions. Persistent RUQ hemianopsia.  Psychiatric: Calm and appropriate  Skin: Skin is warm and dry.  Psychiatric: He has a normal mood and affect. His speech is normal and behavior is normal. Judgment and thought content normal. Cognition and memory are normal.    Assessment/Plan:  1. 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   -he is not ready to return to his prior job given ongoing balance/coordination issues  - he remains pre-vocational for even a sedentary job  2. Pain Management: N/A  3. CKD: per pcp  4. DM type 1: per primary   I'll see him back in about 3 months. Thirty minutes of face to face patient care time were spent during this visit. All questions were encouraged and answered.

## 2015-11-15 ENCOUNTER — Ambulatory Visit: Payer: 59 | Admitting: Physical Medicine & Rehabilitation

## 2016-07-16 ENCOUNTER — Telehealth: Payer: Self-pay | Admitting: Physical Medicine & Rehabilitation

## 2016-07-18 NOTE — Telephone Encounter (Signed)
error 

## 2016-10-07 ENCOUNTER — Encounter: Payer: Self-pay | Admitting: Physical Medicine & Rehabilitation

## 2016-10-07 ENCOUNTER — Encounter: Payer: Self-pay | Attending: Physical Medicine & Rehabilitation | Admitting: Physical Medicine & Rehabilitation

## 2016-10-07 VITALS — BP 159/89 | HR 65

## 2016-10-07 DIAGNOSIS — I129 Hypertensive chronic kidney disease with stage 1 through stage 4 chronic kidney disease, or unspecified chronic kidney disease: Secondary | ICD-10-CM | POA: Insufficient documentation

## 2016-10-07 DIAGNOSIS — E7800 Pure hypercholesterolemia, unspecified: Secondary | ICD-10-CM | POA: Insufficient documentation

## 2016-10-07 DIAGNOSIS — E1022 Type 1 diabetes mellitus with diabetic chronic kidney disease: Secondary | ICD-10-CM | POA: Insufficient documentation

## 2016-10-07 DIAGNOSIS — N189 Chronic kidney disease, unspecified: Secondary | ICD-10-CM | POA: Insufficient documentation

## 2016-10-07 DIAGNOSIS — I6302 Cerebral infarction due to thrombosis of basilar artery: Secondary | ICD-10-CM

## 2016-10-07 DIAGNOSIS — R270 Ataxia, unspecified: Secondary | ICD-10-CM

## 2016-10-07 DIAGNOSIS — Z8673 Personal history of transient ischemic attack (TIA), and cerebral infarction without residual deficits: Secondary | ICD-10-CM | POA: Insufficient documentation

## 2016-10-07 DIAGNOSIS — Z87891 Personal history of nicotine dependence: Secondary | ICD-10-CM | POA: Insufficient documentation

## 2016-10-07 DIAGNOSIS — Z9889 Other specified postprocedural states: Secondary | ICD-10-CM | POA: Insufficient documentation

## 2016-10-07 NOTE — Progress Notes (Signed)
Subjective:    Patient ID: Adrian Neal, male    DOB: 16-Oct-1965, 51 y.o.   MRN: [id number]  HPI   Adrian Neal is here in follow up of his pontine CVA/CVA's. He still has some balance and coordination issues. His right side remains numb. He hasn't been able to return to the work force. He states that his long term disability is about to expire.  He requires a cane for ambulation. He loses his balance frequently to the left and drifts to his right when he walks. His vision can still be double and blurry at times as well. He has little pains and has not had any further strokes over the last year plus.   He is independent with his self-care and ADL's. He's continent of bowels and bladder.   His blood pressure has been under better control and he watches his sugars. He takes plavix for stroke prophylaxis   Pain Inventory Average Pain 0 Pain Right Now 0 My pain is na  In the last 24 hours, has pain interfered with the following? General activity 0 Relation with others 0 Enjoyment of life 0 What TIME of day is your pain at its worst? na Sleep (in general) Fair  Pain is worse with: na Pain improves with: na Relief from Meds: na  Mobility ability to climb steps?  yes do you drive?  yes  Function not employed: date last employed 2016  Neuro/Psych numbness dizziness loss of taste or smell  Prior Studies Any changes since last visit?  no  Physicians involved in your care Any changes since last visit?  no   Family History  Problem Relation Age of Onset  . Hypertension Mother   . Hyperlipidemia Mother   . Hyperlipidemia Father   . Hypertension Father   . Diabetes Father   . Stroke Brother   . Diabetes Brother    Social History   Social History  . Marital status: Married    Spouse name: N/A  . Number of children: N/A  . Years of education: N/A   Social History Main Topics  . Smoking status: Former Smoker    Packs/day: 1.00    Years: 6.00    Quit  date: 04/30/1991  . Smokeless tobacco: Never Used  . Alcohol use No  . Drug use: No  . Sexual activity: Not Asked   Other Topics Concern  . None   Social History Narrative  . None   Past Surgical History:  Procedure Laterality Date  . CATARACT EXTRACTION W/ INTRAOCULAR LENS IMPLANT  1994  . EYE SURGERY  1990   for retinopathy    Past Medical History:  Diagnosis Date  . Chronic kidney disease   . Diabetes mellitus without complication ([place])    diagnosed at age 71  . Eye problems   . High cholesterol   . Hypertension   . 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 Stroke ([place])    BP (!) 159/89   Pulse 65   SpO2 95%   Opioid Risk Score:   Fall Risk Score:  `1  Depression screen PHQ 2/9  Depression screen [facility] 2/9 08/16/2015 09/19/2014  Decreased Interest 0 0  Down, Depressed, Hopeless 0 0  PHQ - 2 Score 0 0  Altered sleeping - 0  Tired, decreased energy - 0  Change in appetite - 0  Feeling bad or failure about yourself  - 0  Trouble concentrating - 0  Moving slowly or fidgety/restless - 0  Suicidal thoughts - 0  PHQ-9 Score - 0    Review of Systems  Constitutional: Negative.   HENT: Negative.   Eyes: Negative.   Respiratory: Negative.   Cardiovascular: Negative.   Gastrointestinal: Negative.   Endocrine: Negative.   Genitourinary: Negative.   Musculoskeletal: Positive for joint swelling.  Skin: Negative.   Allergic/Immunologic: Negative.   Neurological: Negative.   Hematological: Negative.   Psychiatric/Behavioral: Negative.   All other systems reviewed and are negative.      Objective:   Physical Exam  Constitutional: He is oriented to person, place, and time. He appears well-developed and well-nourished. comfortable  HENT: oral mucosa pink and moist  Head: Normocephalic and atraumatic.  Eyes: Conjunctivae are normal. Pupils are equal, round, and reactive to light.  Neck: Normal range of motion. Neck supple.  Cardiovascular:  rrr Respiratory: CTA B GI: Soft. Bowel sounds are normal. He exhibits no distension. There is no tenderness.  Musculoskeletal: He exhibits no edema or tenderness.  Neurological: He is alert and oriented to person, place, and time.  Strength is 5/5 in all 4's. Speech clear and articulate. Follows commands without difficulty. Mild Ataxia with left finger to nose contnues. .romberg negative. . Visual acuity is functional with glasses. He is not squinting to focus. Still leans to right with gait, tends to drift to that side slightly. Has dificulty with changing directions. Persistent RUQ  anopsia.  Psychiatric: Calm and appropriate  Skin: Skin is warm and dry.  Psychiatric: He has a normal mood and affect. His speech is normal and behavior is normal. Judgment and thought content normal. Cognition and memory are normal.    Assessment/Plan:  1. 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 Pain Management: N/A  3. CKD: per pcp  4. DM type 1: per primary   I'll see him back PRN. 25 minutes of face to face patient care time were spent during this visit. All questions were encouraged and answered including detailed paperwork for insurance carrier

## 2017-06-02 ENCOUNTER — Encounter: Payer: Self-pay | Admitting: Physical Medicine & Rehabilitation

## 2017-06-02 ENCOUNTER — Encounter: Payer: Medicaid Other | Attending: Physical Medicine & Rehabilitation | Admitting: Physical Medicine & Rehabilitation

## 2017-06-02 VITALS — BP 155/83 | HR 78

## 2017-06-02 DIAGNOSIS — I6302 Cerebral infarction due to thrombosis of basilar artery: Secondary | ICD-10-CM

## 2017-06-02 DIAGNOSIS — Z9889 Other specified postprocedural states: Secondary | ICD-10-CM | POA: Insufficient documentation

## 2017-06-02 DIAGNOSIS — R270 Ataxia, unspecified: Secondary | ICD-10-CM

## 2017-06-02 DIAGNOSIS — N189 Chronic kidney disease, unspecified: Secondary | ICD-10-CM | POA: Insufficient documentation

## 2017-06-02 DIAGNOSIS — I129 Hypertensive chronic kidney disease with stage 1 through stage 4 chronic kidney disease, or unspecified chronic kidney disease: Secondary | ICD-10-CM | POA: Insufficient documentation

## 2017-06-02 DIAGNOSIS — Z87891 Personal history of nicotine dependence: Secondary | ICD-10-CM | POA: Insufficient documentation

## 2017-06-02 DIAGNOSIS — E1022 Type 1 diabetes mellitus with diabetic chronic kidney disease: Secondary | ICD-10-CM | POA: Insufficient documentation

## 2017-06-02 NOTE — Progress Notes (Addendum)
Subjective:    Patient ID: Adrian Neal, male    DOB: 1965-08-04, 52 y.o.   MRN: [id number]  HPI    This is that the patient is here for follow-up of his pontine and cerebellar infarcts.  He is independent at a household level but remains limited primarily due to his balance and vision.  He still struggles with coordination primarily on the left side.  He has not fallen but has to be careful when he walks and performs chores around the house.  He does manage to drive his son to school half a mile each way in the morning taking extra caution in doing so.  He will occasionally clean some dishes or do the wash but has to take rest breaks and be careful from a balance standpoint.  Blood pressure has been better controlled for the most part although remains borderline.  His sugars remain very labile despite his insulin pump.       Pain Inventory Average Pain 0 Pain Right Now 0 My pain is na  In the last 24 hours, has pain interfered with the following? General activity 0 Relation with others 0 Enjoyment of life 0 What TIME of day is your pain at its worst? na Sleep (in general) Fair  Pain is worse with: na Pain improves with: na Relief from Meds: na  Mobility walk without assistance ability to climb steps?  yes do you drive?  yes  Function disabled: date disabled .  Neuro/Psych weakness numbness trouble walking dizziness  Prior Studies Any changes since last visit?  no  Physicians involved in your care Any changes since last visit?  no   Family History  Problem Relation Age of Onset  . Hypertension Mother   . Hyperlipidemia Mother   . Hyperlipidemia Father   . Hypertension Father   . Diabetes Father   . Stroke Brother   . Diabetes Brother    Social History   Socioeconomic History  . Marital status: Married    Spouse name: None  . Number of children: None  . Years of education: None  . Highest education level: None  Social Needs  . Financial  resource strain: None  . Food insecurity - worry: None  . Food insecurity - inability: None  . Transportation needs - medical: None  . Transportation needs - non-medical: None  Occupational History  . None  Tobacco Use  . Smoking status: Former Smoker    Packs/day: 1.00    Years: 6.00    Pack years: 6.00    Last attempt to quit: 04/30/1991    Years since quitting: 26.1  . Smokeless tobacco: Never Used  Substance and Sexual Activity  . Alcohol use: No  . Drug use: No  . Sexual activity: None  Other Topics Concern  . None  Social History Narrative  . None   Past Surgical History:  Procedure Laterality Date  . CATARACT EXTRACTION W/ INTRAOCULAR LENS IMPLANT  1994  . EYE SURGERY  1990   for retinopathy    Past Medical History:  Diagnosis Date  . Chronic kidney disease   . Diabetes mellitus without complication ([place])    diagnosed at age 76  . Eye problems   . High cholesterol   . Hypertension   . 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 Stroke ([place])    BP (!) 155/83   Pulse 78   SpO2 97%   Opioid Risk Score:  Fall Risk Score:  `1  Depression screen PHQ 2/9  Depression screen [facility] 2/9 08/16/2015 09/19/2014  Decreased Interest 0 0  Down, Depressed, Hopeless 0 0  PHQ - 2 Score 0 0  Altered sleeping - 0  Tired, decreased energy - 0  Change in appetite - 0  Feeling bad or failure about yourself  - 0  Trouble concentrating - 0  Moving slowly or fidgety/restless - 0  Suicidal thoughts - 0  PHQ-9 Score - 0     Review of Systems  Constitutional: Negative.   HENT: Negative.   Eyes: Negative.   Respiratory: Positive for apnea.   Cardiovascular: Negative.   Gastrointestinal: Negative.   Endocrine: Negative.   Genitourinary: Negative.   Musculoskeletal: Positive for joint swelling.  Skin: Negative.   Allergic/Immunologic: Negative.   Neurological: Negative.   Hematological: Negative.   Psychiatric/Behavioral: Negative.   All other  systems reviewed and are negative.      Objective:   Physical Exam Constitutional: He is oriented to person, place, and time. He appears well-developed and well-nourished. comfortable  HENT: oral mucosa pink and moist  Head: Normocephalic and atraumatic.  Eyes: Conjunctivae are normal. Pupils are equal, round, and reactive to light.  Neck: Normal range of motion. Neck supple.  Cardiovascular:  Regular rate Respiratory: Normal effort GI: Soft. Bowel sounds are normal. He exhibits no distension. There is no tenderness.  Musculoskeletal: He exhibits no edema or tenderness.  Neurological: He is alert and oriented to person, place, and time.  Strength is 5/5 in all 4's. Speech clear and articulate. Follows commands without difficulty.  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  With ambulation he tends to lurch to the left and lose his balance frequently.  He almost fell into the wall when he was walking down the hall in our office.  Vision is limited in the right upper quadrant as he has partial peripheral loss there.  Otherwise vision is functional although he struggles with objects at a distance.  Romberg testing is negative.  Strength is 4+ to 5 out of 5 with slightly more weakness in the left upper and lower extremities.  Cognitively he is appropriate.    Psychiatric: Calm and appropriate Skin: Skin is warm and dry.  Psychiatric: He has a normal mood and affect. His speech is normal and behavior is normal. Judgment and thought content normal. Cognition and memory are normal.    Assessment/Plan:  1. 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 He remains significantly limited by his balance and visual deficits.             -he he would be extremely limited in any vocational efforts. 2. Pain Management: N/A  3. CKD: per pcp  4. DM  type 1: per primary.  Sugars remain quite labile  I'll see him back PRN.  15 minutes of face to face patient care time were spent during this visit.  Completed extensive paperwork today regarding his disability insurance.

## 2017-06-02 NOTE — Patient Instructions (Signed)
PLEASE FEEL FREE TO CALL OUR OFFICE WITH ANY PROBLEMS OR QUESTIONS ([phone])      

## 2017-09-23 ENCOUNTER — Encounter (HOSPITAL_COMMUNITY): Payer: Self-pay

## 2017-09-23 ENCOUNTER — Emergency Department (HOSPITAL_COMMUNITY)
Admission: EM | Admit: 2017-09-23 | Discharge: 2017-09-23 | Disposition: A | Discharge status: Home / Self Care | Payer: Medicaid Other | Attending: Emergency Medicine | Admitting: Emergency Medicine

## 2017-09-23 ENCOUNTER — Other Ambulatory Visit: Payer: Self-pay

## 2017-09-23 DIAGNOSIS — Z87891 Personal history of nicotine dependence: Secondary | ICD-10-CM | POA: Insufficient documentation

## 2017-09-23 DIAGNOSIS — N189 Chronic kidney disease, unspecified: Secondary | ICD-10-CM | POA: Insufficient documentation

## 2017-09-23 DIAGNOSIS — L237 Allergic contact dermatitis due to plants, except food: Secondary | ICD-10-CM | POA: Insufficient documentation

## 2017-09-23 DIAGNOSIS — I129 Hypertensive chronic kidney disease with stage 1 through stage 4 chronic kidney disease, or unspecified chronic kidney disease: Secondary | ICD-10-CM | POA: Insufficient documentation

## 2017-09-23 DIAGNOSIS — E1122 Type 2 diabetes mellitus with diabetic chronic kidney disease: Secondary | ICD-10-CM | POA: Insufficient documentation

## 2017-09-23 DIAGNOSIS — Z79899 Other long term (current) drug therapy: Secondary | ICD-10-CM | POA: Insufficient documentation

## 2017-09-23 DIAGNOSIS — Z794 Long term (current) use of insulin: Secondary | ICD-10-CM | POA: Insufficient documentation

## 2017-09-23 DIAGNOSIS — Z7901 Long term (current) use of anticoagulants: Secondary | ICD-10-CM | POA: Insufficient documentation

## 2017-09-23 MED ORDER — DEXAMETHASONE SODIUM PHOSPHATE 10 MG/ML IJ SOLN
10.0000 mg | Freq: Once | INTRAMUSCULAR | Status: AC
  Administered 2017-09-23: 10 mg via INTRAMUSCULAR
  Filled 2017-09-23: qty 1

## 2017-09-23 MED ORDER — FAMOTIDINE 20 MG PO TABS: 20.0000 mg | ORAL_TABLET | Freq: Two times a day (BID) | ORAL | 0 refills | Status: DC

## 2017-09-23 NOTE — ED Notes (Signed)
Pt verbalized understanding of discharge instructions.

## 2017-09-23 NOTE — ED Triage Notes (Signed)
Pt states that he was doing yard about 10 days ago and got into poison ivy with rash on both arms that has not gotten better. Pt states he has been using cortisone and rubbing alcohol.

## 2017-09-23 NOTE — Discharge Instructions (Addendum)
Take Benadryl for itching and for the rash.  You can use hydrocortisone cream as well.  Keep the areas clean and dry.  The shot that we gave you today is to help clear up the rash but can cause her blood sugars to increase to monitor these closely.  Return here as needed.

## 2017-09-23 NOTE — ED Provider Notes (Signed)
[facility] EMERGENCY DEPARTMENT Provider Note   CSN: [id number] Arrival date & time: 09/23/17  1020     History   Chief Complaint Chief Complaint  Patient presents with  . Poison Ivy    HPI Adrian Neal is a 52 y.o. male.  HPI Patient presents to the emergency department with poison ivy that started about 10 days ago while he was out working in the yard.  The patient states his been using cortisone cream without relief of his symptoms.  Patient states that he does not take any other medications.  Patient states nothing seems to make the condition better or worse.  Patient denies any fever, nausea, vomiting, weakness, dizziness or syncope. Past Medical History:  Diagnosis Date  . Chronic kidney disease   . Diabetes mellitus without complication ([place])    diagnosed at age 60  . Eye problems   . High cholesterol   . Hypertension   . 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 Stroke [facility])     Patient Active Problem List   Diagnosis Date Noted  . OSA (obstructive sleep apnea) 03/29/2015  . Type 2 diabetes mellitus with other circulatory complications ([place]) 55/73/2202  . Stroke ([place])   . Hypothyroidism 01/19/2015  . HLD (hyperlipidemia) 11/22/2014  . 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 Abnormal LFTs 08/12/2014  . Ataxia   . CVA (cerebral infarction) 08/03/2014  . ARF (acute renal failure) ([place])   . Insulin dependent diabetes mellitus ([place])   . Cerebral thrombosis with cerebral infarction ([place]) 07/28/2014  . Intractable nausea and vomiting 07/27/2014  . Vertigo 07/27/2014  . Essential hypertension 07/27/2014  . Type 1 diabetes mellitus ([place]) 05/29/2010  . Hyperlipidemia 05/29/2010  . Benign essential HTN 05/29/2010  . PLANTAR FASCIITIS 05/29/2010    Past Surgical History:  Procedure Laterality Date  . CATARACT EXTRACTION W/ INTRAOCULAR LENS IMPLANT  1994  . [place]   for  retinopathy         Home Medications    Prior to Admission medications   Medication Sig Start Date End Date Taking? Authorizing Provider  amLODipine (NORVASC) 10 MG tablet Take 1 tablet (10 mg total) by mouth daily. 08/03/14   Domenic Polite, MD  atorvastatin (LIPITOR) 40 MG tablet Take 40 mg by mouth daily at 6 PM.    [provider]  carvedilol (COREG) 25 MG tablet Take 1 tablet (25 mg total) by mouth 2 (two) times daily with a meal. 08/12/14   Love, Ivan Anchors, PA-C  clopidogrel (PLAVIX) 75 MG tablet Take 1 tablet (75 mg total) by mouth daily. Aspirin and Plavix for 3 months, after that-stop Aspirin-take Plavix alone 01/20/15   Ghimire, Henreitta Leber, MD  Insulin Human (INSULIN PUMP) SOLN Inject 0-2 each into the skin every 4 (four) hours. 08/03/14   Domenic Polite, MD  lisinopril (PRINIVIL,ZESTRIL) 10 MG tablet Take 10 mg by mouth daily.    [provider]  pantoprazole (PROTONIX) 40 MG tablet TAKE ONE TABLET BY MOUTH ONCE DAILY 10/17/14   Meredith Staggers, MD    Family History Family History  Problem Relation Age of Onset  . Hypertension Mother   . Hyperlipidemia Mother   . Hyperlipidemia Father   . Hypertension Father   . Diabetes Father   . Stroke Brother   . Diabetes Brother     Social History Social History   Tobacco Use  .  Smoking status: Former Smoker    Packs/day: 1.00    Years: 6.00    Pack years: 6.00    Last attempt to quit: 04/30/1991    Years since quitting: 26.4  . Smokeless tobacco: Never Used  Substance Use Topics  . Alcohol use: No  . Drug use: No     Allergies   Patient has no known allergies.   Review of Systems Review of Systems All other systems negative except as documented in the HPI. All pertinent positives and negatives as reviewed in the HPI.  Physical Exam Updated Vital Signs BP (!) 158/84 (BP Location: Right Arm)   Pulse 83   Temp 98.2 F (36.8 C) (Oral)   Resp 16   Ht 5\' 8"  (1.727 m)   Wt 83.9 kg (185 lb)   SpO2  97%   BMI 28.13 kg/m   Physical Exam  Constitutional: He is oriented to person, place, and time. He appears well-developed and well-nourished.  HENT:  Head: Normocephalic and atraumatic.  Eyes: Pupils are equal, round, and reactive to light.  Pulmonary/Chest: Effort normal.  Musculoskeletal: He exhibits no edema.  Neurological: He is alert and oriented to person, place, and time.  Skin: Rash noted.     Psychiatric: He has a normal mood and affect. His behavior is normal. Judgment and thought content normal.     ED Treatments / Results  Labs (all labs ordered are listed, but only abnormal results are displayed) Labs Reviewed - No data to display  EKG None  Radiology No results found.  Procedures Procedures (including critical care time)  Medications Ordered in ED Medications  dexamethasone (DECADRON) injection 10 mg (has no administration in time range)     Initial Impression / Assessment and Plan / ED Course  I have reviewed the triage vital signs and the nursing notes.  Pertinent labs & imaging results that were available during my care of the patient were reviewed by me and considered in my medical decision making (see chart for details). Patient be given Decadron and a prescription for Pepcid along with use of Benadryl.  I advised patient to keep the areas clean and dry.  Told to follow-up with his primary doctor.  I did warn him that this could cause a spike in his blood sugar and to monitor this closely.    Final Clinical Impressions(s) / ED Diagnoses   Final diagnoses:  None    ED Discharge Orders    None       Dalia Heading, PA-C 09/23/17 1606    Tegeler, Gwenyth Allegra, MD 09/24/17 0120

## 2017-09-23 NOTE — ED Notes (Signed)
ED Provider at bedside. 

## 2017-09-23 NOTE — ED Notes (Signed)
Pt called for room x3

## 2018-06-01 ENCOUNTER — Ambulatory Visit (INDEPENDENT_AMBULATORY_CARE_PROVIDER_SITE_OTHER): Payer: PPO | Admitting: Family Medicine

## 2018-06-01 ENCOUNTER — Encounter: Payer: Self-pay | Admitting: Gastroenterology

## 2018-06-01 ENCOUNTER — Encounter: Payer: Self-pay | Admitting: Family Medicine

## 2018-06-01 VITALS — BP 130/70 | HR 73 | Temp 97.8°F | Ht 68.0 in | Wt 197.1 lb

## 2018-06-01 DIAGNOSIS — E119 Type 2 diabetes mellitus without complications: Secondary | ICD-10-CM | POA: Diagnosis not present

## 2018-06-01 DIAGNOSIS — I10 Essential (primary) hypertension: Secondary | ICD-10-CM

## 2018-06-01 DIAGNOSIS — IMO0001 Reserved for inherently not codable concepts without codable children: Secondary | ICD-10-CM

## 2018-06-01 DIAGNOSIS — G4733 Obstructive sleep apnea (adult) (pediatric): Secondary | ICD-10-CM | POA: Diagnosis not present

## 2018-06-01 DIAGNOSIS — Z1211 Encounter for screening for malignant neoplasm of colon: Secondary | ICD-10-CM | POA: Diagnosis not present

## 2018-06-01 DIAGNOSIS — E782 Mixed hyperlipidemia: Secondary | ICD-10-CM | POA: Diagnosis not present

## 2018-06-01 DIAGNOSIS — Z8673 Personal history of transient ischemic attack (TIA), and cerebral infarction without residual deficits: Secondary | ICD-10-CM

## 2018-06-01 DIAGNOSIS — E039 Hypothyroidism, unspecified: Secondary | ICD-10-CM

## 2018-06-01 DIAGNOSIS — Z794 Long term (current) use of insulin: Secondary | ICD-10-CM | POA: Diagnosis not present

## 2018-06-01 LAB — POCT GLYCOSYLATED HEMOGLOBIN (HGB A1C): Hemoglobin A1C: 8.6 % — AB (ref 4.0–5.6)

## 2018-06-01 MED ORDER — ATORVASTATIN CALCIUM 40 MG PO TABS: 40.0000 mg | ORAL_TABLET | Freq: Every day | ORAL | 0 refills | Status: DC

## 2018-06-01 MED ORDER — CLOPIDOGREL BISULFATE 75 MG PO TABS: 75.0000 mg | ORAL_TABLET | Freq: Every day | ORAL | 0 refills | Status: DC

## 2018-06-01 MED ORDER — CARVEDILOL 25 MG PO TABS: 25.0000 mg | ORAL_TABLET | Freq: Two times a day (BID) | ORAL | 0 refills | Status: DC

## 2018-06-01 MED ORDER — LISINOPRIL 20 MG PO TABS: 20.0000 mg | ORAL_TABLET | Freq: Every day | ORAL | 0 refills | Status: DC

## 2018-06-01 MED ORDER — AMLODIPINE BESYLATE 10 MG PO TABS: 10.0000 mg | ORAL_TABLET | Freq: Every day | ORAL | 0 refills | Status: DC

## 2018-06-01 NOTE — Progress Notes (Signed)
Established Patient Office Visit  Subjective:  Patient ID: Adrian Neal, male    DOB: Jun 14, 1965  Age: 53 y.o. MRN: [id number]  CC:  Chief Complaint  Patient presents with  . Establish Care    HPI RIDGE LAFOND presents for establishment of care and follow-up on his hypertension, OSA, insulin-dependent diabetes, hypothyroidism, mixed hyperlipidemia and history of hypothyroidism.  Blood pressure has been treated with amlodipine, carvedilol, and lisinopril.  Hydralazine had been added when he had run out of his medicines and had presented to his doctor with a extremely elevated blood pressure.  He has been an insulin-dependent diabetic diabetic since age 23.  He is on the insulin pump at basal rate of 1-1/2 units of regular insulin hourly with preprandial boluses adjusted to carbohydrate load for that meal.  Hemoglobin A1c today is 8.6.  His endocrinologist retired recently.  He has been taking atorvastatin.  He is taking Plavix because of stroke sustained back in 2016.  He is disabled as a result of those strokes.  He had been in the [employer] business.  He has a history of sleep apnea that was diagnosed with a study several years ago.  He had lost his insurance and could not follow-up for the CPAP machine.  He is never had an eye check or colonoscopy.  He lives with his wife 65 year old son.  He has 2 daughters who are college age.  He had been in the [employer] business.  He does not smoke drink alcohol or use illicit drugs.  He is achieving some exercise.  Had been treated for hypothyroidism in the past but told that his thyroid function normalized.  Past Medical History:  Diagnosis Date  . Chronic kidney disease   . Diabetes mellitus without complication ([place])    diagnosed at age 53  . Eye problems   . High cholesterol   . Hypertension   . 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 Stroke [facility])     Past Surgical History:  Procedure  Laterality Date  . CATARACT EXTRACTION W/ INTRAOCULAR LENS IMPLANT  1994  . EYE SURGERY  1990   for retinopathy     Family History  Problem Relation Age of Onset  . Hypertension Mother   . Hyperlipidemia Mother   . Hyperlipidemia Father   . Hypertension Father   . Diabetes Father   . Stroke Brother   . Diabetes Brother     Social History   Socioeconomic History  . Marital status: Married    Spouse name: Not on file  . Number of children: Not on file  . Years of education: Not on file  . Highest education level: Not on file  Occupational History  . Not on file  Social Needs  . Financial resource strain: Not on file  . Food insecurity:    Worry: Not on file    Inability: Not on file  . Transportation needs:    Medical: Not on file    Non-medical: Not on file  Tobacco Use  . Smoking status: Former Smoker    Packs/day: 1.00    Years: 6.00    Pack years: 6.00    Last attempt to quit: 04/30/1991    Years since quitting: 27.1  . Smokeless tobacco: Never Used  Substance and Sexual Activity  . Alcohol use: No  . Drug use: No  . Sexual activity: Not on file  Lifestyle  . Physical activity:  Days per week: Not on file    Minutes per session: Not on file  . Stress: Not on file  Relationships  . Social connections:    Talks on phone: Not on file    Gets together: Not on file    Attends religious service: Not on file    Active member of club or organization: Not on file    Attends meetings of clubs or organizations: Not on file    Relationship status: Not on file  . Intimate partner violence:    Fear of current or ex partner: Not on file    Emotionally abused: Not on file    Physically abused: Not on file    Forced sexual activity: Not on file  Other Topics Concern  . Not on file  Social History Narrative  . Not on file    Outpatient Medications Prior to Visit  Medication Sig Dispense Refill  . Insulin Human (INSULIN PUMP) SOLN Inject 0-2 each into the skin  every 4 (four) hours.    . pantoprazole (PROTONIX) 40 MG tablet TAKE ONE TABLET BY MOUTH ONCE DAILY 30 tablet 0  . amLODipine (NORVASC) 10 MG tablet Take 1 tablet (10 mg total) by mouth daily.    Marland Kitchen atorvastatin (LIPITOR) 40 MG tablet Take 40 mg by mouth daily at 6 PM.    . carvedilol (COREG) 25 MG tablet Take 1 tablet (25 mg total) by mouth 2 (two) times daily with a meal. 60 tablet 0  . clopidogrel (PLAVIX) 75 MG tablet Take 1 tablet (75 mg total) by mouth daily. Aspirin and Plavix for 3 months, after that-stop Aspirin-take Plavix alone 90 tablet 0  . hydrALAZINE (APRESOLINE) 50 MG tablet Take 50 mg by mouth 2 (two) times daily.    Marland Kitchen lisinopril (PRINIVIL,ZESTRIL) 10 MG tablet Take 10 mg by mouth daily.    . famotidine (PEPCID) 20 MG tablet Take 1 tablet (20 mg total) by mouth 2 (two) times daily. 10 tablet 0   No facility-administered medications prior to visit.     No Known Allergies  ROS Review of Systems  Constitutional: Negative for chills, diaphoresis, fatigue, fever and unexpected weight change.  HENT: Negative.   Eyes: Negative for photophobia and visual disturbance.  Respiratory: Negative.   Cardiovascular: Negative.   Gastrointestinal: Negative.   Endocrine: Negative for cold intolerance, heat intolerance, polyphagia and polyuria.  Genitourinary: Negative.   Skin: Negative for pallor.  Allergic/Immunologic: Negative for immunocompromised state.  Neurological: Negative for light-headedness and headaches.  Hematological: Does not bruise/bleed easily.  Psychiatric/Behavioral: Negative.       Objective:    Physical Exam  Constitutional: He is oriented to person, place, and time. He appears well-developed and well-nourished. No distress.  HENT:  Head: Normocephalic and atraumatic.  Right Ear: External ear normal.  Left Ear: External ear normal.  Eyes: Pupils are equal, round, and reactive to light. Conjunctivae are normal. Right eye exhibits no discharge. Left eye  exhibits no discharge. No scleral icterus.  Neck: Neck supple. No JVD present. No tracheal deviation present. No thyromegaly present.  Cardiovascular: Normal rate, regular rhythm and normal heart sounds.  Pulmonary/Chest: Effort normal and breath sounds normal. No stridor.  Abdominal: Bowel sounds are normal.  Lymphadenopathy:    He has no cervical adenopathy.  Neurological: He is alert and oriented to person, place, and time.  Skin: Skin is warm and dry. He is not diaphoretic.  Psychiatric: He has a normal mood and affect. His behavior is normal.  BP 130/70   Pulse 73   Temp 97.8 F (36.6 C) (Oral)   Ht 5\' 8"  (1.727 m)   Wt 197 lb 2 oz (89.4 kg)   SpO2 98%   BMI 29.97 kg/m  Wt Readings from Last 3 Encounters:  06/01/18 197 lb 2 oz (89.4 kg)  09/23/17 185 lb (83.9 kg)  04/05/15 183 lb (83 kg)   BP Readings from Last 3 Encounters:  06/01/18 130/70  09/23/17 133/67  06/02/17 (!) 155/83   Guideline developer:  UpToDate (see UpToDate for funding source) Date Released: June 2014  Health Maintenance Due  Topic Date Due  . FOOT EXAM  11/22/1975  . OPHTHALMOLOGY EXAM  11/22/1975  . HIV Screening  11/21/1980  . COLONOSCOPY  11/22/2015    There are no preventive care reminders to display for this patient.  Lab Results  Component Value Date   TSH 2.756 07/28/2014   Lab Results  Component Value Date   WBC 4.2 01/19/2015   HGB 13.3 01/19/2015   HCT 39.0 01/19/2015   MCV 86.4 01/19/2015   PLT 141 (L) 01/19/2015   Lab Results  Component Value Date   NA 142 01/19/2015   K 4.5 01/19/2015   CO2 28 01/19/2015   GLUCOSE 193 (H) 01/19/2015   BUN 28 (H) 01/19/2015   CREATININE 1.60 (H) 01/19/2015   BILITOT 0.9 01/19/2015   ALKPHOS 73 01/19/2015   AST 21 01/19/2015   ALT 29 01/19/2015   PROT 6.5 01/19/2015   ALBUMIN 4.0 01/19/2015   CALCIUM 9.7 01/19/2015   ANIONGAP 7 01/19/2015   Lab Results  Component Value Date   CHOL 115 01/20/2015   Lab Results  Component  Value Date   HDL 33 (L) 01/20/2015   Lab Results  Component Value Date   LDLCALC 68 01/20/2015   Lab Results  Component Value Date   TRIG 71 01/20/2015   Lab Results  Component Value Date   CHOLHDL 3.5 01/20/2015   Lab Results  Component Value Date   HGBA1C 8.6 (A) 06/01/2018      Assessment & Plan:   Problem List Items Addressed This Visit      Cardiovascular and Mediastinum   Benign essential HTN - Primary   Relevant Medications   amLODipine (NORVASC) 10 MG tablet   atorvastatin (LIPITOR) 40 MG tablet   carvedilol (COREG) 25 MG tablet   lisinopril (PRINIVIL,ZESTRIL) 20 MG tablet     Respiratory   OSA (obstructive sleep apnea)   Relevant Orders   Ambulatory referral to Sleep Studies     Endocrine   Insulin dependent diabetes mellitus (HCC)   Relevant Medications   atorvastatin (LIPITOR) 40 MG tablet   lisinopril (PRINIVIL,ZESTRIL) 20 MG tablet   Other Relevant Orders   POC HgB A1c (Completed)   Ambulatory referral to Endocrinology   Ambulatory referral to Ophthalmology   Hypothyroidism   Relevant Medications   carvedilol (COREG) 25 MG tablet     Other   Hyperlipidemia   Relevant Medications   amLODipine (NORVASC) 10 MG tablet   atorvastatin (LIPITOR) 40 MG tablet   carvedilol (COREG) 25 MG tablet   lisinopril (PRINIVIL,ZESTRIL) 20 MG tablet   History of CVA (cerebrovascular accident)   Relevant Medications   clopidogrel (PLAVIX) 75 MG tablet    Other Visit Diagnoses    Screen for colon cancer       Relevant Orders   Ambulatory referral to Gastroenterology      Meds ordered this encounter  Medications  .  amLODipine (NORVASC) 10 MG tablet    Sig: Take 1 tablet (10 mg total) by mouth daily.    Dispense:  90 tablet    Refill:  0  . atorvastatin (LIPITOR) 40 MG tablet    Sig: Take 1 tablet (40 mg total) by mouth daily at 6 PM.    Dispense:  90 tablet    Refill:  0  . carvedilol (COREG) 25 MG tablet    Sig: Take 1 tablet (25 mg total) by  mouth 2 (two) times daily with a meal.    Dispense:  60 tablet    Refill:  0  . clopidogrel (PLAVIX) 75 MG tablet    Sig: Take 1 tablet (75 mg total) by mouth daily. Aspirin and Plavix for 3 months, after that-stop Aspirin-take Plavix alone    Dispense:  90 tablet    Refill:  0  . lisinopril (PRINIVIL,ZESTRIL) 20 MG tablet    Sig: Take 1 tablet (20 mg total) by mouth daily.    Dispense:  90 tablet    Refill:  0    Follow-up: Return in about 2 weeks (around 06/15/2018), or return fasting.   Have discontinued hydralazine and increased his lisinopril to 20 mg daily.  He will follow-up fasting in 2 weeks.  Hemoglobin A1c today was 8.6.  He has been referred for for his type 1 diabetes.

## 2018-06-10 ENCOUNTER — Ambulatory Visit (INDEPENDENT_AMBULATORY_CARE_PROVIDER_SITE_OTHER): Payer: PPO | Admitting: Gastroenterology

## 2018-06-10 ENCOUNTER — Encounter: Payer: Self-pay | Admitting: Gastroenterology

## 2018-06-10 VITALS — BP 156/66 | HR 63 | Ht 68.0 in | Wt 197.2 lb

## 2018-06-10 DIAGNOSIS — Z1212 Encounter for screening for malignant neoplasm of rectum: Secondary | ICD-10-CM | POA: Diagnosis not present

## 2018-06-10 DIAGNOSIS — Z1211 Encounter for screening for malignant neoplasm of colon: Secondary | ICD-10-CM

## 2018-06-10 DIAGNOSIS — Z8673 Personal history of transient ischemic attack (TIA), and cerebral infarction without residual deficits: Secondary | ICD-10-CM

## 2018-06-10 DIAGNOSIS — Z7901 Long term (current) use of anticoagulants: Secondary | ICD-10-CM | POA: Diagnosis not present

## 2018-06-10 MED ORDER — NA SULFATE-K SULFATE-MG SULF 17.5-3.13-1.6 GM/177ML PO SOLN: 1.0000 | Freq: Once | ORAL | 0 refills | Status: AC

## 2018-06-10 NOTE — Progress Notes (Signed)
Chief Complaint: CRC Screening, chronic antiplatelet therapy  Referring Provider:     [name], MD  HPI:    Adrian Neal is a 53 y.o. male w a hx of OSA, HTN, DM (A1c 8.6), HLD, hypothyroidism, CVA in 2016 (on Plavix currently) presenting to the Gastroenterology Clinic for initial CRC screening. No family history of CRC or related malignancies, and patient is without any active GI sxs. Denies any melena, hematochezia, nausea, vomiting, diarrhea, constipation, change in bowel habits, early satiety, or abdominal pain, and no fever, chills, night sweats or weight loss. No previous CRC screening to date.   Was referred for preoperative evaluation and discussion regarding perioperative antiplatelet therapy. History of CVA in 2016 and currently taking Plavix 75 mg daily.  He is otherwise in his usual state of health and without any acute issues today.  Past Medical History:  Diagnosis Date  . Chronic kidney disease   . Diabetes mellitus without complication ([place])    diagnosed at age 57  . Eye problems   . High cholesterol   . Hypertension   . 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 Sleep apnea    currently not one but looking to get one   . Stroke [facility]) 2016     Past Surgical History:  Procedure Laterality Date  . CATARACT EXTRACTION W/ INTRAOCULAR LENS IMPLANT  1994  . EYE SURGERY  1990   for retinopathy    Family History  Problem Relation Age of Onset  . Hypertension Mother   . Hyperlipidemia Mother   . Hyperlipidemia Father   . Hypertension Father   . Diabetes Father   . Kidney disease Father        had a kidney transplant   . Stroke Brother   . Diabetes Brother   . Pulmonary fibrosis Paternal Grandmother   . Lung cancer Paternal Grandfather   . Colon cancer Neg Hx   . Esophageal cancer Neg Hx    Social History   Tobacco Use  . Smoking status: Former Smoker    Packs/day: 1.00    Years: 6.00    Pack years:  6.00    Last attempt to quit: 04/30/1991    Years since quitting: 27.1  . Smokeless tobacco: Never Used  Substance Use Topics  . Alcohol use: No  . Drug use: Never   Current Outpatient Medications  Medication Sig Dispense Refill  . amLODipine (NORVASC) 10 MG tablet Take 1 tablet (10 mg total) by mouth daily. 90 tablet 0  . atorvastatin (LIPITOR) 40 MG tablet Take 1 tablet (40 mg total) by mouth daily at 6 PM. 90 tablet 0  . carvedilol (COREG) 25 MG tablet Take 1 tablet (25 mg total) by mouth 2 (two) times daily with a meal. 60 tablet 0  . clopidogrel (PLAVIX) 75 MG tablet Take 1 tablet (75 mg total) by mouth daily. Aspirin and Plavix for 3 months, after that-stop Aspirin-take Plavix alone 90 tablet 0  . Insulin Human (INSULIN PUMP) SOLN Inject 0-2 each into the skin every 4 (four) hours.    Marland Kitchen lisinopril (PRINIVIL,ZESTRIL) 20 MG tablet Take 1 tablet (20 mg total) by mouth daily. 90 tablet 0  . pantoprazole (PROTONIX) 40 MG tablet TAKE ONE TABLET BY MOUTH ONCE DAILY 30 tablet 0   No current facility-administered medications for this visit.    No Known Allergies   Review  of Systems: All systems reviewed and negative except where noted in HPI.     Physical Exam:    Wt Readings from Last 3 Encounters:  06/10/18 197 lb 4 oz (89.5 kg)  06/01/18 197 lb 2 oz (89.4 kg)  09/23/17 185 lb (83.9 kg)    BP (!) 156/66   Pulse 63   Ht '5\' 8"'  (1.727 m)   Wt 197 lb 4 oz (89.5 kg)   BMI 29.99 kg/m  Constitutional:  Pleasant, in no acute distress. Psychiatric: Normal mood and affect. Behavior is normal. EENT: Pupils normal.  Conjunctivae are normal. No scleral icterus. Neck supple. No cervical LAD. Cardiovascular: Normal rate, regular rhythm. No edema Pulmonary/chest: Effort normal and breath sounds normal. No wheezing, rales or rhonchi. Abdominal: Soft, nondistended, nontender. Bowel sounds active throughout. There are no masses palpable. No hepatomegaly. Neurological: Alert and oriented to  person place and time. Skin: Skin is warm and dry. No rashes noted.   ASSESSMENT AND PLAN;   1) CRC screening Adrian Neal is a 53 y.o. male presenting to the Gastroenterology Clinic for initial CRC screening. No family history of CRC or related malignancies, and patient is without any active GI sxs. No previous CRC screening to date. Discussed options for CRC screening, to include optical vs virtual colonoscopy - the risks and benefits and pros and cons of each, as well as discussion of FIT kit testing, Cologuard, etc, and the patient decided to proceed with an optical colonoscopy.   - Will schedule date and time for colonoscopy prior to leaving clinic today  - NPO at MN prior to procedure  - Bowel prep ordered with plan for instruction with GI clinical staff - All questions answered  The indications, risks, and benefits of colonoscopy were explained to the patient in detail. Risks include but are not limited to bleeding, perforation, adverse reaction to medications, and cardiopulmonary compromise. Sequelae include but are not limited to the possibility of surgery, hospitalization, and mortality. The patient verbalized understanding and wished to proceed. All questions answered, referred for scheduling and bowel prep ordered. Further recommendations pending results of the exam.    2) Chronic antiplatelet therapy: Takes Plavix 75 mg daily due to history of CVA in 2016.  Per ASGE guidelines, okay to resume Plavix as this is a low risk procedure (diagnostic colonoscopy) and he has an elevated risk of CVA given high CHADS VASc score (4).  We additionally discussed that there is the possibility that if we find a large polyp, may need to reschedule for repeat colonoscopy when able to hold the Plavix for 5 days preoperatively to allow for safer endoscopic mucosal resection.  Otherwise, I plan to remove any smaller polyps with added intraoperative observational time and intraoperative treatment as  appropriate.  He agrees with this plan.  Additional recommendations regarding restarting Plavix postoperatively will be made at time of endoscopy completion.  3) History of CVA: Resume Plavix as outlined above.   Lavena Bullion, DO, FACG  06/10/2018, 9:24 AM   [name],*

## 2018-06-10 NOTE — Patient Instructions (Addendum)
If you are age 53 or older, your body mass index should be between 23-30. Your Body mass index is 29.99 kg/m. If this is out of the aforementioned range listed, please consider follow up with your Primary Care Provider.  If you are age 33 or younger, your body mass index should be between 19-25. Your Body mass index is 29.99 kg/m. If this is out of the aformentioned range listed, please consider follow up with your Primary Care Provider.   We have sent the following medications to your pharmacy for you to pick up at your convenience: [place] have been scheduled for a colonoscopy. Please follow written instructions given to you at your visit today.  Please pick up your prep supplies at the pharmacy within the next 1-3 days. If you use inhalers (even only as needed), please bring them with you on the day of your procedure. Your physician has requested that you go to www.startemmi.com and enter the access code given to you at your visit today. This web site gives a general overview about your procedure. However, you should still follow specific instructions given to you by our office regarding your preparation for the procedure.  It was a pleasure to see you today!  Vito Cirigliano, D.O.

## 2018-06-11 ENCOUNTER — Ambulatory Visit: Payer: PPO | Admitting: Internal Medicine

## 2018-06-15 ENCOUNTER — Ambulatory Visit (INDEPENDENT_AMBULATORY_CARE_PROVIDER_SITE_OTHER): Payer: PPO | Admitting: Family Medicine

## 2018-06-15 ENCOUNTER — Encounter: Payer: Self-pay | Admitting: Family Medicine

## 2018-06-15 VITALS — BP 120/80 | HR 81 | Temp 97.9°F | Ht 68.0 in

## 2018-06-15 DIAGNOSIS — Z125 Encounter for screening for malignant neoplasm of prostate: Secondary | ICD-10-CM | POA: Insufficient documentation

## 2018-06-15 DIAGNOSIS — R42 Dizziness and giddiness: Secondary | ICD-10-CM

## 2018-06-15 DIAGNOSIS — E559 Vitamin D deficiency, unspecified: Secondary | ICD-10-CM

## 2018-06-15 DIAGNOSIS — E039 Hypothyroidism, unspecified: Secondary | ICD-10-CM | POA: Diagnosis not present

## 2018-06-15 DIAGNOSIS — R7989 Other specified abnormal findings of blood chemistry: Secondary | ICD-10-CM

## 2018-06-15 DIAGNOSIS — I10 Essential (primary) hypertension: Secondary | ICD-10-CM | POA: Diagnosis not present

## 2018-06-15 DIAGNOSIS — R270 Ataxia, unspecified: Secondary | ICD-10-CM

## 2018-06-15 DIAGNOSIS — R945 Abnormal results of liver function studies: Secondary | ICD-10-CM | POA: Diagnosis not present

## 2018-06-15 DIAGNOSIS — N183 Chronic kidney disease, stage 3 unspecified: Secondary | ICD-10-CM

## 2018-06-15 DIAGNOSIS — Z1389 Encounter for screening for other disorder: Secondary | ICD-10-CM | POA: Insufficient documentation

## 2018-06-15 DIAGNOSIS — Z0000 Encounter for general adult medical examination without abnormal findings: Secondary | ICD-10-CM

## 2018-06-15 DIAGNOSIS — Z8673 Personal history of transient ischemic attack (TIA), and cerebral infarction without residual deficits: Secondary | ICD-10-CM | POA: Diagnosis not present

## 2018-06-15 LAB — BASIC METABOLIC PANEL
BUN: 29 mg/dL — ABNORMAL HIGH (ref 6–23)
CALCIUM: 8.9 mg/dL (ref 8.4–10.5)
CO2: 28 meq/L (ref 19–32)
Chloride: 104 mEq/L (ref 96–112)
Creatinine, Ser: 1.9 mg/dL — ABNORMAL HIGH (ref 0.40–1.50)
GFR: 37.33 mL/min — ABNORMAL LOW (ref >60.00)
Glucose, Bld: 228 mg/dL — ABNORMAL HIGH (ref 70–99)
Potassium: 4.4 mEq/L (ref 3.5–5.1)
Sodium: 140 mEq/L (ref 135–145)

## 2018-06-15 LAB — URINALYSIS, ROUTINE W REFLEX MICROSCOPIC
Bilirubin Urine: NEGATIVE
Hgb urine dipstick: NEGATIVE
Ketones, ur: NEGATIVE
Leukocytes,Ua: NEGATIVE
Nitrite: NEGATIVE
RBC / HPF: NONE SEEN (ref 0–?)
SPECIFIC GRAVITY, URINE: 1.01 (ref 1.000–1.030)
Total Protein, Urine: 100 — AB
Urine Glucose: >=1000 — AB
Urobilinogen, UA: 0.2 (ref 0.0–1.0)
pH: 5.5 (ref 5.0–8.0)

## 2018-06-15 LAB — HEPATIC FUNCTION PANEL
ALT: 24 U/L (ref 0–53)
AST: 13 U/L (ref 0–37)
Albumin: 4.2 g/dL (ref 3.5–5.2)
Alkaline Phosphatase: 106 U/L (ref 39–117)
Bilirubin, Direct: 0.1 mg/dL (ref 0.0–0.3)
Total Bilirubin: 0.5 mg/dL (ref 0.2–1.2)
Total Protein: 6.5 g/dL (ref 6.0–8.3)

## 2018-06-15 LAB — LIPID PANEL
Cholesterol: 123 mg/dL (ref 0–200)
HDL: 37.2 mg/dL — ABNORMAL LOW (ref >39.00)
LDL Cholesterol: 75 mg/dL (ref 0–99)
NonHDL: 85.3
Total CHOL/HDL Ratio: 3
Triglycerides: 53 mg/dL (ref 0.0–149.0)
VLDL: 10.6 mg/dL (ref 0.0–40.0)

## 2018-06-15 LAB — CBC
HEMATOCRIT: 39.6 % (ref 39.0–52.0)
Hemoglobin: 13.6 g/dL (ref 13.0–17.0)
MCHC: 34.4 g/dL (ref 30.0–36.0)
MCV: 86 fl (ref 78.0–100.0)
Platelets: 118 10*3/uL — ABNORMAL LOW (ref 150.0–400.0)
RBC: 4.61 Mil/uL (ref 4.22–5.81)
RDW: 13.3 % (ref 11.5–15.5)
WBC: 5.4 10*3/uL (ref 4.0–10.5)

## 2018-06-15 LAB — MICROALBUMIN / CREATININE URINE RATIO
Creatinine,U: 59.8 mg/dL
Microalb Creat Ratio: 118.9 mg/g — ABNORMAL HIGH (ref 0.0–30.0)
Microalb, Ur: 71.1 mg/dL — ABNORMAL HIGH (ref 0.0–1.9)

## 2018-06-15 MED ORDER — MECLIZINE HCL 25 MG PO TABS: 25.0000 mg | ORAL_TABLET | Freq: Three times a day (TID) | ORAL | 1 refills | Status: DC | PRN

## 2018-06-15 NOTE — Progress Notes (Addendum)
Established Patient Office Visit  Subjective:  Patient ID: Adrian Neal, male    DOB: [birth year]  Age: 53 y.o. MRN: [id number]  CC:  Chief Complaint  Patient presents with  . Follow-up    HPI Adrian Neal presents for follow-up of his blood pressure status post discontinuing Apresoline and increasing lisinopril.  Blood pressure is been running in the less than 150/100 range.  He looks even better today.  He did experience an episode of dizziness described as a spinning sensation at his church yesterday.  At that time his sugar was slightly elevated and his blood pressure was 150/100.  There was no nausea or vomiting difficulty swallowing or unilateral weakness or numbness.  Patient denies loss of hearing or tinnitus.  He does report some ongoing gait instability with intermittent sensations of falling to 1 side or the other.  He had worked with Dr. Tessa Lerner at physical rehab for this issue in the past.  He tells me this is the basis of his disability.  He is scheduled for colonoscopy.  He is scheduled to see an ophthalmologist.  Urine flow has been good.  Past Medical History:  Diagnosis Date  . Chronic kidney disease   . Diabetes mellitus without complication ([place])    diagnosed at age 19  . Eye problems   . High cholesterol   . Hypertension   . 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 Sleep apnea    currently not one but looking to get one   . Stroke [facility]) 2016    Past Surgical History:  Procedure Laterality Date  . CATARACT EXTRACTION W/ INTRAOCULAR LENS IMPLANT  1994  . EYE SURGERY  1990   for retinopathy     Family History  Problem Relation Age of Onset  . Hypertension Mother   . Hyperlipidemia Mother   . Hyperlipidemia Father   . Hypertension Father   . Diabetes Father   . Kidney disease Father        had a kidney transplant   . Stroke Brother   . Diabetes Brother   . Pulmonary fibrosis Paternal Grandmother   . Lung cancer  Paternal Grandfather   . Colon cancer Neg Hx   . Esophageal cancer Neg Hx     Social History   Socioeconomic History  . Marital status: Married    Spouse name: Not on file  . Number of children: 3  . Years of education: Not on file  . Highest education level: Not on file  Occupational History  . Occupation: Disabled  Social Needs  . Financial resource strain: Not on file  . Food insecurity:    Worry: Not on file    Inability: Not on file  . Transportation needs:    Medical: Not on file    Non-medical: Not on file  Tobacco Use  . Smoking status: Former Smoker    Packs/day: 1.00    Years: 6.00    Pack years: 6.00    Last attempt to quit: 04/30/1991    Years since quitting: 27.1  . Smokeless tobacco: Never Used  Substance and Sexual Activity  . Alcohol use: No  . Drug use: Never  . Sexual activity: Not on file  Lifestyle  . Physical activity:    Days per week: Not on file    Minutes per session: Not on file  . Stress: Not on file  Relationships  . Social connections:    Talks  on phone: Not on file    Gets together: Not on file    Attends religious service: Not on file    Active member of club or organization: Not on file    Attends meetings of clubs or organizations: Not on file    Relationship status: Not on file  . Intimate partner violence:    Fear of current or ex partner: Not on file    Emotionally abused: Not on file    Physically abused: Not on file    Forced sexual activity: Not on file  Other Topics Concern  . Not on file  Social History Narrative  . Not on file    Outpatient Medications Prior to Visit  Medication Sig Dispense Refill  . amLODipine (NORVASC) 10 MG tablet Take 1 tablet (10 mg total) by mouth daily. 90 tablet 0  . atorvastatin (LIPITOR) 40 MG tablet Take 1 tablet (40 mg total) by mouth daily at 6 PM. 90 tablet 0  . carvedilol (COREG) 25 MG tablet Take 1 tablet (25 mg total) by mouth 2 (two) times daily with a meal. 60 tablet 0  .  clopidogrel (PLAVIX) 75 MG tablet Take 1 tablet (75 mg total) by mouth daily. Aspirin and Plavix for 3 months, after that-stop Aspirin-take Plavix alone 90 tablet 0  . Insulin Human (INSULIN PUMP) SOLN Inject 0-2 each into the skin every 4 (four) hours.    Marland Kitchen lisinopril (PRINIVIL,ZESTRIL) 20 MG tablet Take 1 tablet (20 mg total) by mouth daily. 90 tablet 0  . pantoprazole (PROTONIX) 40 MG tablet TAKE ONE TABLET BY MOUTH ONCE DAILY 30 tablet 0   No facility-administered medications prior to visit.     No Known Allergies  ROS Review of Systems  Constitutional: Negative for appetite change, chills, diaphoresis, fatigue, fever and unexpected weight change.  HENT: Negative for congestion, postnasal drip, rhinorrhea, sinus pressure and sinus pain.   Eyes: Negative for photophobia and visual disturbance.  Respiratory: Negative for cough and wheezing.   Cardiovascular: Negative for palpitations and leg swelling.  Gastrointestinal: Negative.   Endocrine: Negative for polyphagia and polyuria.  Genitourinary: Negative for decreased urine volume, difficulty urinating and frequency.  Musculoskeletal: Negative for arthralgias and myalgias.  Skin: Negative for pallor and rash.  Allergic/Immunologic: Negative for immunocompromised state.  Neurological: Positive for dizziness. Negative for speech difficulty, weakness and light-headedness.  Hematological: Negative.   Psychiatric/Behavioral: Negative.       Objective:    Physical Exam  Constitutional: He is oriented to person, place, and time. He appears well-developed and well-nourished. No distress.  HENT:  Head: Normocephalic and atraumatic.  Right Ear: External ear normal.  Left Ear: External ear normal.  Mouth/Throat: Oropharynx is clear and moist. No oropharyngeal exudate.  Eyes: Pupils are equal, round, and reactive to light. Conjunctivae are normal. Right eye exhibits no discharge. Left eye exhibits no discharge. No scleral icterus.  Neck:  Neck supple. No JVD present. No tracheal deviation present. No thyromegaly present.  Cardiovascular: Normal rate, regular rhythm and normal heart sounds.  Pulmonary/Chest: Effort normal and breath sounds normal. No stridor.  Genitourinary: Rectum:     Guaiac result negative.     No rectal mass, anal fissure, tenderness, external hemorrhoid, internal hemorrhoid or abnormal anal tone.  Prostate is enlarged. Prostate is not tender.  Lymphadenopathy:    He has no cervical adenopathy.  Neurological: He is alert and oriented to person, place, and time. He displays a negative Romberg sign.  Skin: Skin is warm  and dry. He is not diaphoretic.  Psychiatric: He has a normal mood and affect. His behavior is normal.    BP 120/80   Pulse 81   Temp 97.9 F (36.6 C) (Oral)   Ht 5\' 8"  (1.727 m)   SpO2 99%   BMI 29.99 kg/m  Wt Readings from Last 3 Encounters:  06/10/18 197 lb 4 oz (89.5 kg)  06/01/18 197 lb 2 oz (89.4 kg)  09/23/17 185 lb (83.9 kg)   BP Readings from Last 3 Encounters:  06/15/18 120/80  06/10/18 (!) 156/66  06/01/18 130/70   Guideline developer:  UpToDate (see UpToDate for funding source) Date Released: June 2014  Health Maintenance Due  Topic Date Due  . FOOT EXAM  11/22/1975  . OPHTHALMOLOGY EXAM  11/22/1975  . HIV Screening  11/21/1980  . COLONOSCOPY  11/22/2015    There are no preventive care reminders to display for this patient.  Lab Results  Component Value Date   TSH 4.41 06/15/2018   Lab Results  Component Value Date   WBC 5.4 06/15/2018   HGB 13.6 06/15/2018   HCT 39.6 06/15/2018   MCV 86.0 06/15/2018   PLT 118.0 (L) 06/15/2018   Lab Results  Component Value Date   NA 140 06/15/2018   K 4.4 06/15/2018   CO2 28 06/15/2018   GLUCOSE 228 (H) 06/15/2018   BUN 29 (H) 06/15/2018   CREATININE 1.90 (H) 06/15/2018   BILITOT 0.5 06/15/2018   ALKPHOS 106 06/15/2018   AST 13 06/15/2018   ALT 24 06/15/2018   PROT 6.5 06/15/2018   ALBUMIN 4.2  06/15/2018   CALCIUM 8.9 06/15/2018   ANIONGAP 7 01/19/2015   GFR 37.33 (L) 06/15/2018   Lab Results  Component Value Date   CHOL 123 06/15/2018   Lab Results  Component Value Date   HDL 37.20 (L) 06/15/2018   Lab Results  Component Value Date   LDLCALC 75 06/15/2018   Lab Results  Component Value Date   TRIG 53.0 06/15/2018   Lab Results  Component Value Date   CHOLHDL 3 06/15/2018   Lab Results  Component Value Date   HGBA1C 8.6 (A) 06/01/2018      Assessment & Plan:   Problem List Items Addressed This Visit      Cardiovascular and Mediastinum   Essential hypertension   Relevant Orders   CBC (Completed)   Basic metabolic panel (Completed)   Urinalysis, Routine w reflex microscopic (Completed)   Microalbumin / creatinine urine ratio (Completed)     Endocrine   Hypothyroidism   Relevant Orders   TSH (Completed)     Genitourinary   CKD (chronic kidney disease) stage 3, GFR 30-59 ml/min (HCC)     Other   Vertigo   Relevant Medications   meclizine (ANTIVERT) 25 MG tablet   Other Relevant Orders   Ambulatory referral to Physical Medicine Rehab   Ataxia   Relevant Orders   Ambulatory referral to Physical Medicine Rehab   Abnormal LFTs   Relevant Orders   Hepatic function panel (Completed)   History of CVA (cerebrovascular accident)   Relevant Orders   Lipid panel (Completed)   Ambulatory referral to Physical Medicine [place] maintenance - Primary   Relevant Orders   CBC (Completed)   PSA (Completed)   VITAMIN D 25 Hydroxy (Vit-D Deficiency, Fractures) (Completed)    Other Visit Diagnoses    Vitamin D deficiency       Relevant Medications   Vitamin  D, Ergocalciferol, (DRISDOL) 1.25 MG (50000 UT) CAPS capsule      Meds ordered this encounter  Medications  . meclizine (ANTIVERT) 25 MG tablet    Sig: Take 1 tablet (25 mg total) by mouth 3 (three) times daily as needed for dizziness.    Dispense:  30 tablet    Refill:  1  .  Vitamin D, Ergocalciferol, (DRISDOL) 1.25 MG (50000 UT) CAPS capsule    Sig: Take 1 capsule (50,000 Units total) by mouth every 7 (seven) days.    Dispense:  5 capsule    Refill:  5    Follow-up: Return in about 1 month (around 07/14/2018).   Patient will check and record his blood pressures and follow-up in 1 month.

## 2018-06-16 DIAGNOSIS — N183 Chronic kidney disease, stage 3 (moderate): Secondary | ICD-10-CM

## 2018-06-16 DIAGNOSIS — N1832 Chronic kidney disease, stage 3b: Secondary | ICD-10-CM | POA: Insufficient documentation

## 2018-06-16 LAB — TSH: TSH: 4.41 u[IU]/mL (ref 0.35–4.50)

## 2018-06-16 LAB — PSA: PSA: 0.76 ng/mL (ref 0.10–4.00)

## 2018-06-16 LAB — VITAMIN D 25 HYDROXY (VIT D DEFICIENCY, FRACTURES): VITD: 16.95 ng/mL — ABNORMAL LOW (ref 30.00–100.00)

## 2018-06-16 MED ORDER — VITAMIN D (ERGOCALCIFEROL) 1.25 MG (50000 UNIT) PO CAPS: 50000.0000 [IU] | ORAL_CAPSULE | ORAL | 5 refills | Status: DC

## 2018-06-16 NOTE — Addendum Note (Signed)
Addended by: Jon Billings on: 06/16/2018 04:54 PM   Modules accepted: Orders

## 2018-06-18 ENCOUNTER — Encounter: Payer: Self-pay | Admitting: Internal Medicine

## 2018-06-18 ENCOUNTER — Other Ambulatory Visit: Payer: Self-pay

## 2018-06-18 ENCOUNTER — Encounter: Payer: Self-pay | Admitting: Gastroenterology

## 2018-06-18 ENCOUNTER — Ambulatory Visit (INDEPENDENT_AMBULATORY_CARE_PROVIDER_SITE_OTHER): Payer: PPO | Admitting: Internal Medicine

## 2018-06-18 VITALS — BP 132/64 | HR 88 | Resp 16 | Ht 68.0 in | Wt 193.8 lb

## 2018-06-18 DIAGNOSIS — E1065 Type 1 diabetes mellitus with hyperglycemia: Secondary | ICD-10-CM | POA: Diagnosis not present

## 2018-06-18 DIAGNOSIS — E1059 Type 1 diabetes mellitus with other circulatory complications: Secondary | ICD-10-CM | POA: Diagnosis not present

## 2018-06-18 DIAGNOSIS — N183 Chronic kidney disease, stage 3 unspecified: Secondary | ICD-10-CM

## 2018-06-18 DIAGNOSIS — E1022 Type 1 diabetes mellitus with diabetic chronic kidney disease: Secondary | ICD-10-CM

## 2018-06-18 DIAGNOSIS — E10319 Type 1 diabetes mellitus with unspecified diabetic retinopathy without macular edema: Secondary | ICD-10-CM

## 2018-06-18 NOTE — Progress Notes (Signed)
Name: Adrian Neal  MRN/ DOB: [id number], 04/08/1966   Age/ Sex: 53 y.o., male    PCP: [name], MD   Reason for Endocrinology Evaluation: Type 1 Diabetes Mellitus     Date of Initial Endocrinology Visit: 06/18/2018     PATIENT IDENTIFIER: [name] is a 53 y.o. male with a past medical history of HTN,Hyperlipidemia and Hx of CVA . The patient presented for initial endocrinology clinic visit on 06/18/2018 for consultative assistance with his diabetes management.    HPI: Mr. Bauernfeind was    Diagnosed with T1DM at age 20 Prior Medications tried/Intolerance: N/A. Has been on the pump 2012 Currently checking blood sugars 0 .  Hypoglycemia episodes : yes - based on feeling as he does not check sugar     Symptoms: shaky     Frequency: 1/ week  Hemoglobin A1c has ranged from 7.2% , peaking at 9.0%  Patient required assistance for hypoglycemia: not since 1995 Patient has required hospitalization within the last 1 year from hyper or hypoglycemia: no  In terms of diet, the patient drinks diet drinks but at times he would drinks orange juice. Eats 3 meals a day, rare snacking.     This patient with type 1 diabetes is treated with Medtronic (insulin pump). During the visit the pump basal and bolus doses were reviewed including carb/insulin rations and supplemental doses. The clinical list was updated. The glucose meter download was reviewed in detail to determine if the current pump settings are providing the best glycemic control without excessive hypoglycemia.   HOME DIABETES REGIMEN: Pump and meter download:    Pump   [place] since 2015   Insulin type   NOVOLOG    Basal rate       0000-0000  1.5 u/h       I:C ratio       0000-0000 1:6                  Sensitivity       0000  20      Goal       0000  100-120          Type & Model of Pump: Medtronic minimed 630 G Insulin Type: Currently using Humalog   Body mass index is  29.47 kg/m.  PUMP STATISTICS: Average BG: 388 +/- 149 BG Readings: 5 (0.4 / day) Average Daily Carbs (g): 79 +/- 28 Average Total Daily Insulin: 68.45 +/- 8.6 Average Daily Basal: 35.42 (52 %) Average Daily Bolus: 33.02 (48 %)    Statin: yes ACE-I/ARB: yes Prior Diabetic Education: No   METER DOWNLOAD SUMMARY: Does not use  DIABETIC COMPLICATIONS: Microvascular complications:   CKD III, 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 in 1991.   Denies: Neuropathy   Last eye exam: Completed long time ago , upcoming appointment 07/07/2018 - ran out of insurance in 2016.  Macrovascular complications:   CVA  Denies: CAD, PVD   PAST HISTORY: Past Medical History:  Past Medical History:  Diagnosis Date  . Chronic kidney disease   . Diabetes mellitus without complication ([place])    diagnosed at age 16  . Eye problems   . High cholesterol   . Hypertension   . 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 Sleep apnea    currently not one but looking to get one   . Stroke [facility]) 2016   Past Surgical History:  Past Surgical  History:  Procedure Laterality Date  . CATARACT EXTRACTION W/ INTRAOCULAR LENS IMPLANT  1994  . EYE SURGERY  1990   for retinopathy       Social History:  reports that he quit smoking about 27 years ago. He has a 6.00 pack-year smoking history. He has never used smokeless tobacco. He reports that he does not drink alcohol or use drugs. Family History:  Family History  Problem Relation Age of Onset  . Hypertension Mother   . Hyperlipidemia Mother   . Hyperlipidemia Father   . Hypertension Father   . Diabetes Father   . Kidney disease Father        had a kidney transplant   . Stroke Brother   . Diabetes Brother   . Pulmonary fibrosis Paternal Grandmother   . Lung cancer Paternal Grandfather   . Colon cancer Neg Hx   . Esophageal cancer Neg Hx      HOME MEDICATIONS: Allergies as of 06/18/2018   No Known Allergies     Medication List        Accurate as of June 18, 2018  9:37 AM. Always use your most recent med list.        amLODipine 10 MG tablet Commonly known as:  NORVASC Take 1 tablet (10 mg total) by mouth daily.   atorvastatin 40 MG tablet Commonly known as:  LIPITOR Take 1 tablet (40 mg total) by mouth daily at 6 PM.   carvedilol 25 MG tablet Commonly known as:  COREG Take 1 tablet (25 mg total) by mouth 2 (two) times daily with a meal.   clopidogrel 75 MG tablet Commonly known as:  PLAVIX Take 1 tablet (75 mg total) by mouth daily. Aspirin and Plavix for 3 months, after that-stop Aspirin-take Plavix alone   insulin pump Soln Inject 0-2 each into the skin every 4 (four) hours.   lisinopril 20 MG tablet Commonly known as:  PRINIVIL,ZESTRIL Take 1 tablet (20 mg total) by mouth daily.   meclizine 25 MG tablet Commonly known as:  ANTIVERT Take 1 tablet (25 mg total) by mouth 3 (three) times daily as needed for dizziness.   pantoprazole 40 MG tablet Commonly known as:  PROTONIX TAKE ONE TABLET BY MOUTH ONCE DAILY   Vitamin D (Ergocalciferol) 1.25 MG (50000 UT) Caps capsule Commonly known as:  DRISDOL Take 1 capsule (50,000 Units total) by mouth every 7 (seven) days.        ALLERGIES: No Known Allergies   REVIEW OF SYSTEMS: A comprehensive ROS was conducted with the patient and is negative except as per HPI and below:  Review of Systems  Constitutional: Negative for fever.  HENT: Negative for congestion and sore throat.   Eyes: Positive for blurred vision. Negative for pain.  Respiratory: Negative for cough and shortness of breath.   Cardiovascular: Negative for chest pain and palpitations.  Gastrointestinal: Negative for diarrhea and nausea.  Genitourinary: Positive for frequency.  Skin: Negative.   Neurological: Negative for tingling and tremors.  Endo/Heme/Allergies: Positive for polydipsia.  Psychiatric/Behavioral: Negative for depression. The patient is not nervous/anxious.         OBJECTIVE:   VITAL SIGNS: BP 132/64 (BP Location: Left Arm, Patient Position: Sitting, Cuff Size: Normal)   Pulse 88   Resp 16   Ht 5\' 8"  (1.727 m)   Wt 193 lb 12.8 oz (87.9 kg)   SpO2 98%   BMI 29.47 kg/m    PHYSICAL EXAM:  General: Pt appears well and  is in NAD  Hydration: Well-hydrated with moist mucous membranes and good skin turgor  HEENT: Head: Unremarkable with good dentition. Oropharynx clear without exudate.  Eyes: External eye exam normal without stare, lid lag or exophthalmos.  EOM intact.  PERRL.  Neck: General: Supple without adenopathy or carotid bruits. Thyroid: Thyroid size normal.  No goiter or nodules appreciated. No thyroid bruit.  Lungs: Clear with good BS bilat with no rales, rhonchi, or wheezes  Heart: RRR with normal S1 and S2 and no gallops; no murmurs; no rub  Abdomen: Normoactive bowel sounds, soft, nontender, without masses or organomegaly palpable  Extremities:  Lower extremities - No pretibial edema. No lesions.  Skin: Normal texture and temperature to palpation. No rash noted. No Acanthosis nigricans/skin tags. No lipohypertrophy.  Neuro: MS is good with appropriate affect, pt is alert and Ox3    DM foot exam: 06/18/18  The skin of the feet is intact without sores or ulcerations. Pt with thickened and dystrophic toe nails The pedal pulses are 2+ on right and 2+ on left. The sensation is intact to a screening 5.07, 10 gram monofilament bilaterally    DATA REVIEWED:  Lab Results  Component Value Date   HGBA1C 8.6 (A) 06/01/2018   HGBA1C 7.7 (H) 01/20/2015   HGBA1C 7.5 (H) 01/19/2015   Lab Results  Component Value Date   MICROALBUR 71.1 (H) 06/15/2018   LDLCALC 75 06/15/2018   CREATININE 1.90 (H) 06/15/2018   Lab Results  Component Value Date   MICRALBCREAT 118.9 (H) 06/15/2018    Lab Results  Component Value Date   CHOL 123 06/15/2018   HDL 37.20 (L) 06/15/2018   LDLCALC 75 06/15/2018   TRIG 53.0 06/15/2018   CHOLHDL 3  06/15/2018        ASSESSMENT / PLAN / RECOMMENDATIONS:   1) Type 1 Diabetes Mellitus, Poorly controlled, With CKD III and retinopathic complications - Most recent A1c of 8.6 %. Goal A1c < 7.5 %.   Plan: GENERAL: I have discussed with the patient the pathophysiology of diabetes. We went over the natural progression of the disease. We talked about both insulin resistance and insulin deficiency. We stressed the importance of lifestyle changes including diet and exercise. I explained the complications associated with diabetes including retinopathy, nephropathy, neuropathy as well as increased risk of cardiovascular disease. We went over the benefit seen with glycemic control.   I explained to the patient that diabetic patients are at higher than normal risk for amputations. .   He understands that he already has many of the diabetic complications listed above, but I have encouraged him that with better glucose control we can slow down or even halt the progression of these complications.  He is not a reliable user of the insulin pump, patient uses manual boluses a lot during the day and overrides the pump settings as well, he does not check his glucose, he only checked it 5 times in the past 2 weeks.  I have explained to him the importance of checking glucose and entering those into the pump settings, if he is concerned or scared about using the pump settings he will need to communicate with [place] to adjust her settings but not continue to override and manual bolus.  Patient states he has never gotten a formal diabetic education, he has had the Medtronic pump for probably over a year, he initially used a sensor but was tired due to the constant alerts.  He is interesting in looking for other  options or getting a new CGM.  I have adjusted his pump settings today, I am not clearly sure yet if the settings are going to work or not, based on his weight his basal should be around 0.7 units/h, currently he is  on 1.5 units/h , but will have to start somewhere in between to prevent severe hypoglycemia and hypoglycemia.  He was encouraged to use the ReliOn  meter at home to check glucose before meals and bedtime, he was provided with a glucose log to provide these numbers as the meter is non-downloadable.  He was encouraged to call us with any concerns of hypoglycemia or hyperglycemia, he was encouraged to enter his BG's into the pump every time he checks and every time he eats.  We will set him up to see our CDE for retraining on insulin pump use and refresher on carbohydrate counting.    MEDICATIONS:  Pump   [place] since 2015  Insulin type   Humalog    Basal rate       0000-0000  1.0 u/h       I:C ratio       0000-0000 1:15                  Sensitivity       0000  45      Goal       0000  100-120   EDUCATION / INSTRUCTIONS:  BG monitoring instructions: Patient is instructed to check his blood sugars 4 times a day, before meals and bedtime.  Call [facility] if: BG persistently < 70 or > 300. . I reviewed the Rule of 15 for the treatment of hypoglycemia in detail with the patient. Literature supplied.   2) Diabetic complications:   Eye: Does have known diabetic retinopathy.  He has not seen an ophthalmologist in many years due to lack of insurance, he has an appointment scheduled on July 07, 2018.    Neuro/ Feet: Does not have known diabetic peripheral neuropathy.  Renal: Patient does have known baseline CKD. He is on an ACEI/ARB at present.   3) Lipids: Patient is on a statin.    4) Hypertension: He is at goal of < 140/90 mmHg.    Follow-up in 2 weeks   Signed electronically by: Mack Guise, MD  [facility]  [facility] [address] [place], [place] [address] Phone: [phone] FAX: [phone]   CC: [name], MD [place]  [address] Phone: [phone]  Fax: [phone]    Return to Endocrinology clinic as below: Future Appointments  Date Time Provider [place]  07/02/2018  9:30 AM Lavena Bullion, DO [facility]  07/07/2018  8:45 AM [name], MD TRE-TRE None  07/14/2018 11:30 AM Dohmeier, Asencion Partridge, MD [facility] None  07/15/2018  8:30 AM [name], MD [facility] P[facility]

## 2018-06-18 NOTE — Patient Instructions (Signed)
New Pump Settings  Basal rate       0000-0000  1.0 u/h       I:C ratio       0000-0000 1:15      Sensitivity       0000  45      Goal       0000  100-120    - Check sugar before each meal and bedtime  - Use the following resources for increased carb counting accuracy:  Foods with a Nutrition Facts label: Always check serving size and total carbohydrates.  Measure food as needed to determine exact carbs in the serving you are eating (food should always be measured as it is ready to eat - for example  1/2 cup oatmeal refers to 1/2 cup cooked not 1/2 cup uncooked).  Foods without a Nutrition Facts label:  Eating out - Try using the Go Meals app to determine carb content of fast food and sit down restaurant foods        - HOW TO TREAT LOW BLOOD SUGARS (Blood sugar LESS THAN 70 MG/DL)  Please follow the RULE OF 15 for the treatment of hypoglycemia treatment (when your (blood sugars are less than 70 mg/dL)    STEP 1: Take 15 grams of carbohydrates when your blood sugar is low, which includes:   3-4 GLUCOSE TABS  OR  3-4 OZ OF JUICE OR REGULAR SODA OR  ONE TUBE OF GLUCOSE GEL     STEP 2: RECHECK blood sugar in 15 MINUTES STEP 3: If your blood sugar is still low at the 15 minute recheck --> then, go back to STEP 1 and treat AGAIN with another 15 grams of carbohydrates.

## 2018-06-26 ENCOUNTER — Telehealth: Payer: Self-pay | Admitting: Internal Medicine

## 2018-06-26 NOTE — Telephone Encounter (Signed)
Could you please advise?

## 2018-06-26 NOTE — Telephone Encounter (Signed)
[facility] ph# [phone] called re: follow up on form that was faxed to our office. Please call the above to give instructions on how to handle patient's insulin pump pre and post procedure (07/02/18).

## 2018-06-29 ENCOUNTER — Encounter: Payer: PPO | Attending: Internal Medicine | Admitting: Nutrition

## 2018-06-29 DIAGNOSIS — E1065 Type 1 diabetes mellitus with hyperglycemia: Secondary | ICD-10-CM | POA: Diagnosis not present

## 2018-06-29 NOTE — Telephone Encounter (Signed)
Form was faxed to [facility]

## 2018-07-01 ENCOUNTER — Other Ambulatory Visit: Payer: Self-pay

## 2018-07-01 ENCOUNTER — Encounter: Payer: Self-pay | Admitting: Internal Medicine

## 2018-07-01 ENCOUNTER — Ambulatory Visit (INDEPENDENT_AMBULATORY_CARE_PROVIDER_SITE_OTHER): Payer: PPO | Admitting: Internal Medicine

## 2018-07-01 VITALS — BP 140/72 | HR 78 | Temp 98.0°F | Ht 68.0 in | Wt 192.8 lb

## 2018-07-01 DIAGNOSIS — E1022 Type 1 diabetes mellitus with diabetic chronic kidney disease: Secondary | ICD-10-CM

## 2018-07-01 DIAGNOSIS — E10319 Type 1 diabetes mellitus with unspecified diabetic retinopathy without macular edema: Secondary | ICD-10-CM

## 2018-07-01 DIAGNOSIS — N183 Chronic kidney disease, stage 3 (moderate): Secondary | ICD-10-CM | POA: Diagnosis not present

## 2018-07-01 DIAGNOSIS — E1059 Type 1 diabetes mellitus with other circulatory complications: Secondary | ICD-10-CM

## 2018-07-01 MED ORDER — INSULIN ASPART 100 UNIT/ML ~~LOC~~ SOLN: SUBCUTANEOUS | 11 refills | Status: DC

## 2018-07-01 NOTE — Progress Notes (Signed)
Name: Adrian Neal  Age/ Sex: 53 y.o., male   MRN/ DOB: [id number], 1966-02-05     PCP: [name], MD   Reason for Endocrinology Evaluation: Type 1 Diabetes Mellitus  Initial Endocrine Consultative Visit: 06/18/2018    PATIENT IDENTIFIER: Adrian Neal is a 54 y.o. male with a past medical history of .HTN,Hyperlipidemia and Hx of CVA  The patient has followed with Endocrinology clinic since 06/18/2018 for consultative assistance with management of his diabetes.  DIABETIC HISTORY:  Adrian Neal was diagnosed with T1DM at age 70, he has been on insulin pump since 2012. His hemoglobin A1c has ranged from 7.2% , peaking at 9.0%    SUBJECTIVE:   During the last visit (06/18/2018): His A1c 8.6 %, reduced his basal rate from 1.5 u/hr to 1.0 u/hr. He was not using the bolus wizard, nor was he checking glucose.   Today (07/01/2018): Mr. Gram  He checks his blood sugars 4 times daily, preprandial . The patient has  had hypoglycemic episodes since the last clinic visit, which typically occur 2 x /week - most often occuring when he was using manual boluses and counting his own corrections. The patient is symptomatic with these episodes.. Otherwise, the patient has not required any recent emergency interventions for hypoglycemia and has not had recent hospitalizations secondary to hyper or hypoglycemic episodes.   He is scheduled for colonoscopy in 2 days  He saw our CDE Ms. Spagnolia, and we have adjusted his sensitivity factor from 45 to 35, and changed his I:C ratio from 1:15 to 1:13.    This patient with type 1 diabetes is treated with humalog (insulin pump). During the visit the pump basal and bolus doses were reviewed including carb/insulin rations and supplemental doses. The clinical list was updated. The glucose meter download was reviewed in detail to determine if the current pump  settings are providing the best glycemic control without excessive hypoglycemia.  Pump and meter download:    Pump   METRONIC     Insulin type   HUMALOG    Basal rate       0000-0000  1.0u/h               I:C ratio       0000-0000  1:13                  Sensitivity       0000  35       Goal       0000  100-120           Type & Model of Pump: Medtronic Insulin Type: Currently using Humalog  Body mass index is 29.32 kg/m.  PUMP STATISTICS: Average BG: 253+/- 110 BG Readings: 85 (2.8 / day) Average Daily Carbs (g): 207 +/- 130 Average Total Daily Insulin: 68.99 +/- 9.0 Average Daily Basal: 30.76 (45 %) Average Daily Bolus: 38.23 (55 %)    ROS: As per HPI and as detailed below: Review of Systems  Constitutional: Negative for fever and weight loss.  Respiratory: Negative for cough and shortness of breath.   Cardiovascular: Negative for chest pain and palpitations.  Gastrointestinal: Negative for diarrhea and nausea.      HISTORY:  Past Medical History:  Past Medical History:  Diagnosis Date  . Chronic kidney disease   . Diabetes mellitus without complication (Adrian Neal)    diagnosed at age 16  . Eye problems   . High cholesterol   .  Hypertension   . 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 Sleep apnea    currently not one but looking to get one   . Stroke Adrian Neal) 2016   Past Surgical History:  Past Surgical History:  Procedure Laterality Date  . CATARACT EXTRACTION W/ INTRAOCULAR LENS IMPLANT  1994  . EYE SURGERY  1990   for retinopathy     Social History:  reports that he quit smoking about 27 years ago. He has a 6.00 pack-year smoking history. He has never used smokeless tobacco. He reports that he does not drink alcohol or use drugs. Family History:  Family History  Problem Relation Age of Onset  . Hypertension Mother   . Hyperlipidemia Mother   . Hyperlipidemia Father   . Hypertension Father   . Diabetes Father   .  Kidney disease Father        had a kidney transplant   . Stroke Brother   . Diabetes Brother   . Pulmonary fibrosis Paternal Grandmother   . Lung cancer Paternal Grandfather   . Colon cancer Neg Hx   . Esophageal cancer Neg Hx      HOME MEDICATIONS: Allergies as of 07/01/2018   No Known Allergies     Medication List       Accurate as of July 01, 2018  2:43 PM. Always use your most recent med list.        amLODipine 10 MG tablet Commonly known as:  NORVASC Take 1 tablet (10 mg total) by mouth daily.   atorvastatin 40 MG tablet Commonly known as:  LIPITOR Take 1 tablet (40 mg total) by mouth daily at 6 PM.   carvedilol 25 MG tablet Commonly known as:  COREG Take 1 tablet (25 mg total) by mouth 2 (two) times daily with a meal.   clopidogrel 75 MG tablet Commonly known as:  PLAVIX Take 1 tablet (75 mg total) by mouth daily. Aspirin and Plavix for 3 months, after that-stop Aspirin-take Plavix alone   insulin aspart 100 UNIT/ML injection Commonly known as:  NOVOLOG Max daily dose of 65 units via pump   insulin pump Soln Inject 0-2 each into the skin every 4 (four) hours.   lisinopril 20 MG tablet Commonly known as:  PRINIVIL,ZESTRIL Take 1 tablet (20 mg total) by mouth daily.   meclizine 25 MG tablet Commonly known as:  ANTIVERT Take 1 tablet (25 mg total) by mouth 3 (three) times daily as needed for dizziness.   pantoprazole 40 MG tablet Commonly known as:  PROTONIX TAKE ONE TABLET BY MOUTH ONCE DAILY   Vitamin D (Ergocalciferol) 1.25 MG (50000 UT) Caps capsule Commonly known as:  DRISDOL Take 1 capsule (50,000 Units total) by mouth every 7 (seven) days.        OBJECTIVE:   Vital Signs: BP 140/72 (BP Location: Left Arm, Patient Position: Sitting, Cuff Size: Large)   Pulse 78   Temp 98 F (36.7 C) (Oral)   Ht 5\' 8"  (1.727 m)   Wt 192 lb 12.8 oz (87.5 kg)   SpO2 98%   BMI 29.32 kg/m   Wt Readings from Last 3 Encounters:  07/01/18 192 lb 12.8 oz  (87.5 kg)  06/18/18 193 lb 12.8 oz (87.9 kg)  06/10/18 197 lb 4 oz (89.5 kg)     Exam: General: Pt appears well and is in NAD  Lungs: Clear with good BS bilat with no rales, rhonchi, or wheezes  Heart: RRR  with normal S1 and S2 and no gallops; no murmurs; no rub  Extremities: No pretibial edema. No tremor. Normal strength and motion throughout.   Skin: Normal texture and temperature to palpation. No rash noted.   Neuro: MS is good with appropriate affect, pt is alert and Ox3    DM foot exam: :06/18/18  The skin of the feet is intact without sores or ulcerations. Pt with thickened and dystrophic toe nails The pedal pulses are 2+ on right and 2+ on left. The sensation is intact to a screening 5.07, 10 gram monofilament bilaterally           DATA REVIEWED:  Lab Results  Component Value Date   HGBA1C 8.6 (A) 06/01/2018   HGBA1C 7.7 (H) 01/20/2015   HGBA1C 7.5 (H) 01/19/2015   Lab Results  Component Value Date   MICROALBUR 71.1 (H) 06/15/2018   LDLCALC 75 06/15/2018   CREATININE 1.90 (H) 06/15/2018   Lab Results  Component Value Date   MICRALBCREAT 118.9 (H) 06/15/2018    Lab Results  Component Value Date   CHOL 123 06/15/2018   HDL 37.20 (L) 06/15/2018   LDLCALC 75 06/15/2018   TRIG 53.0 06/15/2018   CHOLHDL 3 06/15/2018         ASSESSMENT / PLAN / RECOMMENDATIONS:   1) Type 1 Diabetes Mellitus, Poorly controlled, With CKD III and retinopathic complications - Most recent A1c of 8.6 %. Goal A1c < 7.5 %.     Plan:  -He has been slowly starting to use his insulin pump properly, I have praised him on avoiding manual boluses. -I also have praised him on frequent glucose checks. -He is in the process of getting a colonoscopy in the next couple days, he is starting liquid diet today, he was advised to try and avoid sugar sweetened beverages, continue hydrations as advised by GI. -Patient understands he will need to continue to enter any carbohydrates taken so he  can receive prandial insulin. I am going to adjust his insulin to carb ratio based on data from yesterday where his preprandial sugar was 192 mg/dL, he entered 23 grams for which he received 1.7 unit of insulin for that meal, repeat BG was 314 mg/dL.   MEDICATIONS: Pump   METRONIC     Insulin type   HUMALOG    Basal rate       0000-0000  1.0u/h               I:C ratio       0000-0000  1:11                  Sensitivity       0000  35       Goal       0000  100-120         EDUCATION / INSTRUCTIONS:  BG monitoring instructions: Patient is instructed to check his blood sugars 4 times a day, before meals and bedtime.  Call [facility] if: BG persistently < 70 or > 300. . I reviewed the Rule of 15 for the treatment of hypoglycemia in detail with the patient. Literature supplied.    F/U in 6 weeks   Signed electronically by: Mack Guise, MD  [facility]  [address] [place],  [address] Phone: [phone] FAX: [phone]   CC: [name], [place] [address] Phone: [phone]  Fax: [phone]  Return  to Endocrinology clinic as below: Future Appointments  Date Time Provider [place]  07/02/2018  9:30 AM Lavena Bullion, DO [facility]  07/07/2018  8:45 AM [name], MD [facility] None  07/14/2018 11:30 AM Dohmeier, Asencion Partridge, MD [facility] None  07/15/2018  8:30 AM [name], MD [facility] P[facility]  08/12/2018  9:30 AM Shamleffer, Melanie Crazier, MD [facility] None

## 2018-07-01 NOTE — Patient Instructions (Signed)
New Pump Settings  Basal rate       0000-0000  1.0 u/h       I:C ratio       0000-0000 1:11      Sensitivity       0000  35      Goal       0000  100-120    - Check sugar before each meal and bedtime  - Use the following resources for increased carb counting accuracy:  Foods with a Nutrition Facts label: Always check serving size and total carbohydrates.  Measure food as needed to determine exact carbs in the serving you are eating (food should always be measured as it is ready to eat - for example  1/2 cup oatmeal refers to 1/2 cup cooked not 1/2 cup uncooked).  Foods without a Nutrition Facts label:  Eating out - Try using the Go Meals app to determine carb content of fast food and sit down restaurant foods        - HOW TO TREAT LOW BLOOD SUGARS (Blood sugar LESS THAN 70 MG/DL)  Please follow the RULE OF 15 for the treatment of hypoglycemia treatment (when your (blood sugars are less than 70 mg/dL)    STEP 1: Take 15 grams of carbohydrates when your blood sugar is low, which includes:   3-4 GLUCOSE TABS  OR  3-4 OZ OF JUICE OR REGULAR SODA OR  ONE TUBE OF GLUCOSE GEL     STEP 2: RECHECK blood sugar in 15 MINUTES STEP 3: If your blood sugar is still low at the 15 minute recheck --> then, go back to STEP 1 and treat AGAIN with another 15 grams of carbohydrates.

## 2018-07-02 ENCOUNTER — Other Ambulatory Visit: Payer: Self-pay

## 2018-07-02 ENCOUNTER — Ambulatory Visit (AMBULATORY_SURGERY_CENTER): Payer: PPO | Admitting: Gastroenterology

## 2018-07-02 ENCOUNTER — Encounter: Payer: Self-pay | Admitting: Gastroenterology

## 2018-07-02 VITALS — BP 169/83 | HR 94 | Temp 97.7°F | Resp 11 | Ht 68.0 in | Wt 197.0 lb

## 2018-07-02 DIAGNOSIS — D125 Benign neoplasm of sigmoid colon: Secondary | ICD-10-CM

## 2018-07-02 DIAGNOSIS — D122 Benign neoplasm of ascending colon: Secondary | ICD-10-CM

## 2018-07-02 DIAGNOSIS — K640 First degree hemorrhoids: Secondary | ICD-10-CM

## 2018-07-02 DIAGNOSIS — Z1211 Encounter for screening for malignant neoplasm of colon: Secondary | ICD-10-CM

## 2018-07-02 MED ORDER — SODIUM CHLORIDE 0.9 % IV SOLN: 500.0000 mL | Freq: Once | INTRAVENOUS | Status: DC

## 2018-07-02 NOTE — Progress Notes (Signed)
Called to room to assist during endoscopic procedure.  Patient ID and intended procedure confirmed with present staff. Received instructions for my participation in the procedure from the performing physician.  

## 2018-07-02 NOTE — Progress Notes (Signed)
To PACU, VSS. Report to [email or web address] 

## 2018-07-02 NOTE — Patient Instructions (Signed)
Discharge instructions given. Handouts on polyps and hemorrhoids. Resume previous medications. YOU HAD AN ENDOSCOPIC PROCEDURE TODAY AT THE [facility]:   Refer to the procedure report that was given to you for any specific questions about what was found during the examination.  If the procedure report does not answer your questions, please call your gastroenterologist to clarify.  If you requested that your care partner not be given the details of your procedure findings, then the procedure report has been included in a sealed envelope for you to review at your convenience later.  YOU SHOULD EXPECT: Some feelings of bloating in the abdomen. Passage of more gas than usual.  Walking can help get rid of the air that was put into your GI tract during the procedure and reduce the bloating. If you had a lower endoscopy (such as a colonoscopy or flexible sigmoidoscopy) you may notice spotting of blood in your stool or on the toilet paper. If you underwent a bowel prep for your procedure, you may not have a normal bowel movement for a few days.  Please Note:  You might notice some irritation and congestion in your nose or some drainage.  This is from the oxygen used during your procedure.  There is no need for concern and it should clear up in a day or so.  SYMPTOMS TO REPORT IMMEDIATELY:   Following lower endoscopy (colonoscopy or flexible sigmoidoscopy):  Excessive amounts of blood in the stool  Significant tenderness or worsening of abdominal pains  Swelling of the abdomen that is new, acute  Fever of 100F or higher   For urgent or emergent issues, a gastroenterologist can be reached at any hour by calling [phone].   DIET:  We do recommend a small meal at first, but then you may proceed to your regular diet.  Drink plenty of fluids but you should avoid alcoholic beverages for 24 hours.  ACTIVITY:  You should plan to take it easy for the rest of today and you should NOT DRIVE  or use heavy machinery until tomorrow (because of the sedation medicines used during the test).    FOLLOW UP: Our staff will call the number listed on your records the next business day following your procedure to check on you and address any questions or concerns that you may have regarding the information given to you following your procedure. If we do not reach you, we will leave a message.  However, if you are feeling well and you are not experiencing any problems, there is no need to return our call.  We will assume that you have returned to your regular daily activities without incident.  If any biopsies were taken you will be contacted by phone or by letter within the next 1-3 weeks.  Please call us at [phone] if you have not heard about the biopsies in 3 weeks.    SIGNATURES/CONFIDENTIALITY: You and/or your care partner have signed paperwork which will be entered into your electronic medical record.  These signatures attest to the fact that that the information above on your After Visit Summary has been reviewed and is understood.  Full responsibility of the confidentiality of this discharge information lies with you and/or your care-partner. 

## 2018-07-02 NOTE — Op Note (Signed)
[place] Patient Name: Adrian Neal Procedure Date: 07/02/2018 9:57 AM MRN: [id number] Endoscopist: Gerrit Heck , MD Age: 53 Referring MD:  Date of Birth: 09-28-1965 Gender: Male Account #: [id number] Procedure:                Colonoscopy Indications:              Screening for colorectal malignant neoplasm, This                            is the patient's first colonoscopy Medicines:                Monitored Anesthesia Care Procedure:                Pre-Anesthesia Assessment:                           - Prior to the procedure, a History and Physical                            was performed, and patient medications and                            allergies were reviewed. The patient's tolerance of                            previous anesthesia was also reviewed. The risks                            and benefits of the procedure and the sedation                            options and risks were discussed with the patient.                            All questions were answered, and informed consent                            was obtained. Prior Anticoagulants: The patient has                            taken Plavix (clopidogrel), last dose was 1 day                            prior to procedure. ASA Grade Assessment: III - A                            patient with severe systemic disease. After                            reviewing the risks and benefits, the patient was                            deemed in satisfactory condition to undergo the  procedure.                           After obtaining informed consent, the colonoscope                            was passed under direct vision. Throughout the                            procedure, the patient's blood pressure, pulse, and                            oxygen saturations were monitored continuously. The                            Colonoscope was introduced through the anus and                  advanced to the the terminal ileum. The colonoscopy                            was performed without difficulty. The patient                            tolerated the procedure well. The quality of the                            bowel preparation was adequate. The terminal ileum,                            ileocecal valve, appendiceal orifice, and rectum                            were photographed. Scope In: 10:16:27 AM Scope Out: 10:36:33 AM Scope Withdrawal Time: 0 hours 14 minutes 0 seconds  Total Procedure Duration: 0 hours 20 minutes 6 seconds  Findings:                 The perianal and digital rectal examinations were                            normal.                           A 2 mm polyp was found in the ascending colon. The                            polyp was sessile. The polyp was removed with a                            cold biopsy forceps. Resection and retrieval were                            complete. Estimated blood loss was minimal.  A 5 mm polyp was found in the sigmoid colon. The                            polyp was sessile. The polyp was removed with a                            cold snare. Resection and retrieval were complete.                            Estimated blood loss was minimal.                           Non-bleeding internal hemorrhoids were found during                            retroflexion. The hemorrhoids were small.                           The terminal ileum appeared normal. Complications:            No immediate complications. Estimated Blood Loss:     Estimated blood loss was minimal. Impression:               - One 2 mm polyp in the ascending colon, removed                            with a cold biopsy forceps. Resected and retrieved.                           - One 5 mm polyp in the sigmoid colon, removed with                            a cold snare. Resected and retrieved.                           -  Non-bleeding internal hemorrhoids.                           - The examined portion of the ileum was normal. Recommendation:           - Patient has a contact number available for                            emergencies. The signs and symptoms of potential                            delayed complications were discussed with the                            patient. Return to normal activities tomorrow.                            Written discharge instructions were provided to the  patient.                           - Resume previous diet.                           - Continue present medications.                           - Await pathology results.                           - Repeat colonoscopy in 7-10 years for surveillance                            based on pathology results.                           - Return to GI clinic PRN. Gerrit Heck, MD 07/02/2018 10:41:30 AM

## 2018-07-03 ENCOUNTER — Telehealth: Payer: Self-pay | Admitting: *Deleted

## 2018-07-03 NOTE — Telephone Encounter (Signed)
No answer, left message to call if questions or concerns. 

## 2018-07-03 NOTE — Telephone Encounter (Signed)
  Follow up Call-  Call back number 07/02/2018  Post procedure Call Back phone  # [phone]  Permission to leave phone message Yes  Some recent data might be hidden     Patient questions:  Do you have a fever, pain , or abdominal swelling? No. Pain Score  0 *  Have you tolerated food without any problems? Yes.    Have you been able to return to your normal activities? Yes.    Do you have any questions about your discharge instructions: Diet   No. Medications  No. Follow up visit  No.  Do you have questions or concerns about your Care? No.  Actions: * If pain score is 4 or above: No action needed, pain <4.

## 2018-07-05 NOTE — Progress Notes (Addendum)
[facility] Note  07/07/2018     CHIEF COMPLAINT Patient presents for Diabetic Eye Exam   HISTORY OF PRESENT ILLNESS: Adrian Neal is a 53 y.o. male who presents to the clinic today for:   HPI    Diabetic Eye Exam    Vision is blurred for distance and is blurred for near.  Associated Symptoms Negative for Flashes, Blind Spot, Photophobia, Scalp Tenderness, Fever, Floaters, Pain, Glare, Jaw Claudication, Weight Loss, Distortion, Redness, Shoulder/Hip pain, Fatigue and Trauma.  Diabetes characteristics include Type 1.  This started 47 years ago.  Blood sugar level is controlled.  Last Blood Glucose 181 (This am).  Last A1C 8.2 (Last week).  I, the attending physician,  performed the HPI with the patient and updated documentation appropriately.          Comments    Patient states vision blurred at times. Working with endocrinologist to bring blood sugars under control. History of several surgeries OD, including vitrectomy for vitreous hemorrhage and scar tissue removal in early 1990's. Cataract extraction also performed early to mid 1990's, right eye, in [place], [place]. Patient is type I diabetic, diagnosed at 53 years old.        Last edited by [name], MD on 07/07/2018  9:46 AM. (History)    pt states he has had previous retinal sx in [place], he states he had a cataract removed from his right eye at [facility], pt states he moved here in 1999-2000, pt states he has seen Dr. Zigmund Daniel before, but the last time was in 2013, pt states he had a stroke in 2016, but then last his insurance and just got it back, pt states he does not feel like he has had any vision changes since he saw Dr. Zigmund Daniel, pt states the last time he saw any retinal dr was back in 2017 in [place] when it was required for him to get disability  Referring physician: [name], MD [place], [place] [address]  HISTORICAL INFORMATION:    Selected notes from the MEDICAL RECORD NUMBER Referred by Dr. [name] for DM exam LEE:  Ocular Hx-cataract OD, pseudo OS PMH-DM (type 1, A1C: 8.6, takes novolog), heart murmur, HLD, HTN, stroke    CURRENT MEDICATIONS: No current outpatient medications on file. (Ophthalmic Drugs)   No current facility-administered medications for this visit.  (Ophthalmic Drugs)   Current Outpatient Medications (Other)  Medication Sig  . amLODipine (NORVASC) 10 MG tablet Take 1 tablet (10 mg total) by mouth daily.  Marland Kitchen atorvastatin (LIPITOR) 40 MG tablet Take 1 tablet (40 mg total) by mouth daily at 6 PM.  . carvedilol (COREG) 25 MG tablet Take 1 tablet (25 mg total) by mouth 2 (two) times daily with a meal.  . clopidogrel (PLAVIX) 75 MG tablet Take 1 tablet (75 mg total) by mouth daily. Aspirin and Plavix for 3 months, after that-stop Aspirin-take Plavix alone  . insulin aspart (NOVOLOG) 100 UNIT/ML injection Max daily dose of 65 units via pump  DX E10.59  . Insulin Human (INSULIN PUMP) SOLN Inject 0-2 each into the skin every 4 (four) hours.  Marland Kitchen lisinopril (PRINIVIL,ZESTRIL) 20 MG tablet Take 1 tablet (20 mg total) by mouth daily.  . meclizine (ANTIVERT) 25 MG tablet Take 1 tablet (25 mg total) by mouth 3 (three) times daily as needed for dizziness.  . pantoprazole (PROTONIX) 40 MG tablet TAKE ONE TABLET BY MOUTH ONCE DAILY  . Vitamin  D, Ergocalciferol, (DRISDOL) 1.25 MG (50000 UT) CAPS capsule Take 1 capsule (50,000 Units total) by mouth every 7 (seven) days.   No current facility-administered medications for this visit.  (Other)      REVIEW OF SYSTEMS: ROS    Positive for: Endocrine, Cardiovascular, Eyes   Negative for: Constitutional, Gastrointestinal, Neurological, Skin, Genitourinary, Musculoskeletal, HENT, Respiratory, Psychiatric, Allergic/Imm, Heme/Lymph   Last edited by Roselee Nova D on 07/07/2018  8:54 AM. (History)       ALLERGIES No Known Allergies  PAST MEDICAL HISTORY Past  Medical History:  Diagnosis Date  . Cataract 1994   right eye  . Chronic kidney disease   . Diabetes mellitus without complication ([place])    diagnosed at age 80  . Eye problems   . Heart murmur 1996  . High cholesterol    patient denies but take preventative medicine  . Hypertension   . Retinopathy due to secondary diabetes mellitus ([place])    right  . Sleep apnea    currently not one but looking to get one   . Stroke [facility]) 2016   Past Surgical History:  Procedure Laterality Date  . CATARACT EXTRACTION Right 1994  . CATARACT EXTRACTION W/ INTRAOCULAR LENS IMPLANT  1994  . EYE SURGERY Right 1991   vitrectomy  . EYE SURGERY Right 1992   scar tissue removed from retina    FAMILY HISTORY Family History  Problem Relation Age of Onset  . Hypertension Mother   . Hyperlipidemia Mother   . Hyperlipidemia Father   . Hypertension Father   . Diabetes Father   . Kidney disease Father        had a kidney transplant   . Stroke Brother   . Diabetes Brother   . Pulmonary fibrosis Paternal Grandmother   . Lung cancer Paternal Grandfather   . Diabetes Daughter   . Colon cancer Neg Hx   . Esophageal cancer Neg Hx   . Rectal cancer Neg Hx   . Stomach cancer Neg Hx     SOCIAL HISTORY Social History   Tobacco Use  . Smoking status: Former Smoker    Packs/day: 1.00    Years: 6.00    Pack years: 6.00    Last attempt to quit: 04/30/1991    Years since quitting: 27.2  . Smokeless tobacco: Never Used  Substance Use Topics  . Alcohol use: No  . Drug use: Never         OPHTHALMIC EXAM:  Base Eye Exam    Visual Acuity (Snellen - Linear)      Right Left   Dist cc 20/50 +2 20/25   Dist ph cc NI NI   Correction:  Glasses       Tonometry (Tonopen, 9:12 AM)      Right Left   Pressure 18 14       Pupils      Dark Light Shape React APD   Right 3 2.5 Round Minimal None   Left 4 3.5 Round Minimal None       Visual Fields (Counting fingers)      Left Right    Full     Restrictions  Partial outer superior temporal, inferior temporal deficiencies       Extraocular Movement      Right Left    Full, Ortho Full, Ortho       Neuro/Psych    Oriented x3:  Yes   Mood/Affect:  Normal       Dilation  Both eyes:  1.0% Mydriacyl, 2.5% Phenylephrine @ 9:12 AM        Slit Lamp and Fundus Exam    Slit Lamp Exam      Right Left   Lids/Lashes dermatochalasis, small palpebral fissure rmatochalasis - upper lid dermatochalasis   Conjunctiva/Sclera White and quiet White and quiet   Cornea 1-2 +PEEs, 1-2+ endopigment, focal band K at 3:00 1-2+ PEE   Anterior Chamber Deep and quiet narrow angles   Iris Round, poor dilation, no NVI round, dilated, no NVI   Lens PC IOL in good position 2+, Nuclear sclerosis, 2+, Cortical cataract   Vitreous post vitrectomy vitreous syneresis       Fundus Exam      Right Left   Disc cupping, Pallor, fibrosis at 4:30, peripapillary CR scarring at 9:00 Pink and Sharp   C/D Ratio 0.7 0.3   Macula Flat, Retinal pigment epithelial mottling, no edema, ERM, blunted foveal reflex, no heme blunted foveal reflex, no heme or  edema, trace ERM   Vessels Vascular attenuation Vascular attenuation   Periphery 360 PRP attached, 360 PRP to arcades        Refraction    Wearing Rx      Sphere Cylinder Axis Add   Right +2.25 +0.50 150 +2.25   Left +1.50 +2.00 095 +2.25   Age:  5 yr   Type:  PAL       Manifest Refraction      Sphere Cylinder Axis Dist VA   Right +2.25 +0.50 135 20/50   Left +2.50 +1.75 092 20/25-2          IMAGING AND PROCEDURES  Imaging and Procedures for @TODAY @  OCT, Retina - OU - Both Eyes       Right Eye Quality was good. Central Foveal Thickness: 215. Progression has been stable. Findings include normal foveal contour, no IRF, no SRF, outer retinal atrophy, epiretinal membrane (Stable from 2013; mild interval progression of ERM).   Left Eye Quality was good. Central Foveal Thickness: 255. Progression  has been stable. Findings include normal foveal contour, no SRF, no IRF, epiretinal membrane (Stable from 2013).   Notes *Images captured and stored on drive  Diagnosis / Impression:  Hx of PDR OU No DME OU ERM OU (OD>OS)   Clinical management:  See below  Abbreviations: NFP - Normal foveal profile. CME - cystoid macular edema. PED - pigment epithelial detachment. IRF - intraretinal fluid. SRF - subretinal fluid. EZ - ellipsoid zone. ERM - epiretinal membrane. ORA - outer retinal atrophy. ORT - outer retinal tubulation. SRHM - subretinal hyper-reflective material        Fluorescein Angiography Optos (Transit OD)       Right Eye   Progression has no prior data. Early phase findings include delayed filling, staining, window defect, vascular perfusion defect. Mid/Late phase findings include microaneurysm, vascular perfusion defect, window defect, staining, leakage.   Left Eye   Progression has no prior data. Early phase findings include staining, window defect. Mid/Late phase findings include staining, window defect (No leakage).   Notes Images stored on drive;   Impression: PDR OU OS stable with good PRP laser in place OD with severe staining of nasal fibrosis, but also stable                  ASSESSMENT/PLAN:    ICD-10-CM   1. 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 Retinal edema H35.81 OCT, Retina - OU - Both Eyes  3. Essential hypertension I10   4. Hypertensive retinopathy of both eyes H35.033 Fluorescein Angiography Optos (Transit OD)  5. Pseudophakia Z96.1   6. Combined forms of age-related cataract of left eye H25.812     1,2. DM1 w/ Proliferative diabetic retinopathy w/o DME, OU - history of extensive retinal work done in [place], [place] in 90s per pt -- including PPV w/ laser OD - saw Dr. Zigmund Daniel once in 2013 and then  pt lost insurance and has not had regular eye care since - pt now has medical coverage and is re-establishing care - The incidence, risk factors for progression, natural history and treatment options for diabetic retinopathy were discussed with patient.   - The need for close monitoring of blood glucose, blood pressure, and serum lipids, avoiding cigarette or any type of tobacco, and the need for long term follow up was also discussed with patient. - exam shows extensive PRP and regressed fibrosis/NV OD -- no significant edema - FA today (3.10.2020) shows scattered MA, but no active NV - OCT without diabetic macular edema, both eyes  - discussed findings and prognosis - good PRP in place OU - no acute intervention indicated at this time - recommend monitoring for now - f/u in 6 mos -- DFE/OCT  3,4. Hypertensive retinopathy OU - discussed importance of tight BP control - monitor  5. Pseudophakia OD  - s/p CE/IOL OD by surgeon at [facility] [phone]  - doing well  - monitor  6. Mixed Cataract OS - The symptoms of cataract, surgical options, and treatments and risks were discussed with patient. - discussed diagnosis and progression - not yet visually significant - monitor for now   Ophthalmic Meds Ordered this visit:  No orders of the defined types were placed in this encounter.      Return 6 months , for DFE, OCT.  There are no Patient Instructions on file for this visit.   Explained the diagnoses, plan, and follow up with the patient and they expressed understanding.  Patient expressed understanding of the importance of proper follow up care.   This document serves as a record of services personally performed by Gardiner Sleeper, MD, PhD. It was created on their behalf by Ernest Mallick, OA, an ophthalmic assistant. The creation of this record is the provider's dictation and/or activities during the visit.    Electronically signed by: Ernest Mallick, OA  03.08.2020 1:02 PM     Gardiner Sleeper, M.D., Ph.D. Diseases & Surgery of the Retina and Vitreous [facility]  I have reviewed the above documentation for accuracy and completeness, and I agree with the above. Gardiner Sleeper, M.D., Ph.D. 07/07/18 1:02 PM     Abbreviations: M myopia (nearsighted); A astigmatism; H hyperopia (farsighted); P presbyopia; Mrx spectacle prescription;  CTL contact lenses; OD right eye; OS left eye; OU both eyes  XT exotropia; ET esotropia; PEK punctate epithelial keratitis; PEE punctate epithelial erosions; DES dry eye syndrome; MGD meibomian gland dysfunction; ATs artificial tears; PFAT's preservative free artificial tears; [place] nuclear sclerotic cataract; PSC posterior subcapsular cataract; ERM epi-retinal membrane; PVD posterior vitreous detachment; RD retinal detachment; DM diabetes mellitus; DR diabetic retinopathy; NPDR non-proliferative diabetic retinopathy; PDR proliferative diabetic retinopathy; CSME clinically significant macular edema; DME diabetic macular edema; dbh dot blot hemorrhages; CWS cotton wool spot; POAG primary open angle glaucoma; C/D cup-to-disc ratio; HVF humphrey visual field; GVF  goldmann visual field; OCT optical coherence tomography; IOP intraocular pressure; BRVO Branch retinal vein occlusion; CRVO central retinal vein occlusion; CRAO central retinal artery occlusion; BRAO branch retinal artery occlusion; RT retinal tear; SB scleral buckle; PPV pars plana vitrectomy; VH Vitreous hemorrhage; PRP panretinal laser photocoagulation; IVK intravitreal kenalog; VMT vitreomacular traction; MH Macular hole;  NVD neovascularization of the disc; NVE neovascularization elsewhere; AREDS age related eye disease study; ARMD age related macular degeneration; POAG primary open angle glaucoma; EBMD epithelial/anterior basement membrane dystrophy; ACIOL anterior chamber intraocular lens; IOL intraocular lens; PCIOL posterior chamber intraocular lens; Phaco/IOL  phacoemulsification with intraocular lens placement; [place] photorefractive keratectomy; LASIK laser assisted in situ keratomileusis; HTN hypertension; DM diabetes mellitus; COPD chronic obstructive pulmonary disease

## 2018-07-06 ENCOUNTER — Other Ambulatory Visit: Payer: Self-pay | Admitting: Family Medicine

## 2018-07-06 DIAGNOSIS — Z8673 Personal history of transient ischemic attack (TIA), and cerebral infarction without residual deficits: Secondary | ICD-10-CM

## 2018-07-06 MED ORDER — CLOPIDOGREL BISULFATE 75 MG PO TABS: 75.0000 mg | ORAL_TABLET | Freq: Every day | ORAL | 1 refills | Status: DC

## 2018-07-06 NOTE — Progress Notes (Addendum)
Patient is currectly on a Medtronic 630G insulin pump.  He is testing 4-6X daily,ac meals and snacks. He is counting carbs and appears to be doing this correctly.  He was given a list of 15 gram portions of carbs, and encouraged to use the [employer] app for eating out  Pump download shows he is changing his infusion sets q 3-4 days, and appears to be putting carbs in for every meal/snack.  He does however do change the correction dose on the pump by putting in  his own correction when high-- giving 8 units for every 100 point drop.  When he has done this, since Dr. Kelton Pillar changed his correction dose, he has dropped low.  He was shown this on the download. Download shown to Dr. Kelton Pillar, and changes were made to pump settings:   ISF: changes from 45 to 35, and I/C ratio changes drom 15 to 12.  No changes to basal rate. It was explained to the patient what would be happening now to his bolus amounts, and he was strongly encouraged to "give what the pump saus to give for 1 week.  He was also encouraged to sign up for [facility], and to download his pump after 1 week and call us with his user name and password. He was given written directions to do this.   He was given praise for testing and putting in carbs and told that we are half way there to getting this pump set correctly for him.  He had no final quesitons.

## 2018-07-06 NOTE — Patient Instructions (Signed)
Please give what pump says to give for boluses with correction doses. Download pump in one week and call us with user name and password.

## 2018-07-07 ENCOUNTER — Encounter (INDEPENDENT_AMBULATORY_CARE_PROVIDER_SITE_OTHER): Payer: Self-pay | Admitting: Ophthalmology

## 2018-07-07 ENCOUNTER — Ambulatory Visit (INDEPENDENT_AMBULATORY_CARE_PROVIDER_SITE_OTHER): Payer: PPO | Admitting: Ophthalmology

## 2018-07-07 DIAGNOSIS — Z961 Presence of intraocular lens: Secondary | ICD-10-CM

## 2018-07-07 DIAGNOSIS — H35033 Hypertensive retinopathy, bilateral: Secondary | ICD-10-CM

## 2018-07-07 DIAGNOSIS — H25812 Combined forms of age-related cataract, left eye: Secondary | ICD-10-CM | POA: Diagnosis not present

## 2018-07-07 DIAGNOSIS — I10 Essential (primary) hypertension: Secondary | ICD-10-CM

## 2018-07-07 DIAGNOSIS — E103593 Type 1 diabetes mellitus with proliferative diabetic retinopathy without macular edema, bilateral: Secondary | ICD-10-CM | POA: Diagnosis not present

## 2018-07-07 DIAGNOSIS — H3581 Retinal edema: Secondary | ICD-10-CM

## 2018-07-08 ENCOUNTER — Other Ambulatory Visit: Payer: Self-pay | Admitting: Family Medicine

## 2018-07-08 DIAGNOSIS — E782 Mixed hyperlipidemia: Secondary | ICD-10-CM

## 2018-07-08 MED ORDER — ATORVASTATIN CALCIUM 40 MG PO TABS: 40.0000 mg | ORAL_TABLET | Freq: Every day | ORAL | 0 refills | Status: DC

## 2018-07-14 ENCOUNTER — Ambulatory Visit: Payer: PPO | Admitting: Neurology

## 2018-07-14 ENCOUNTER — Other Ambulatory Visit: Payer: Self-pay

## 2018-07-14 ENCOUNTER — Encounter: Payer: Self-pay | Admitting: Neurology

## 2018-07-14 VITALS — BP 153/76 | HR 80 | Temp 97.5°F | Ht 68.0 in | Wt 193.0 lb

## 2018-07-14 DIAGNOSIS — G4719 Other hypersomnia: Secondary | ICD-10-CM | POA: Diagnosis not present

## 2018-07-14 DIAGNOSIS — R0683 Snoring: Secondary | ICD-10-CM

## 2018-07-14 DIAGNOSIS — E10319 Type 1 diabetes mellitus with unspecified diabetic retinopathy without macular edema: Secondary | ICD-10-CM

## 2018-07-14 DIAGNOSIS — I6330 Cerebral infarction due to thrombosis of unspecified cerebral artery: Secondary | ICD-10-CM | POA: Diagnosis not present

## 2018-07-14 DIAGNOSIS — G4733 Obstructive sleep apnea (adult) (pediatric): Secondary | ICD-10-CM

## 2018-07-14 DIAGNOSIS — E1022 Type 1 diabetes mellitus with diabetic chronic kidney disease: Secondary | ICD-10-CM

## 2018-07-14 DIAGNOSIS — N182 Chronic kidney disease, stage 2 (mild): Secondary | ICD-10-CM | POA: Diagnosis not present

## 2018-07-14 NOTE — Progress Notes (Signed)
SLEEP MEDICINE CLINIC   Provider:  Larey Seat, MD   Primary Care Physician:  [name], MD   Referring Provider: [name],*    Chief Complaint  Patient presents with  . New Patient (Initial Visit)    pt had a CVA Stroke in 12-2014. By 12 -2016  a SPLIT sleep study diagnosed OSA/ severe  apnea but was unable to afford because he had lost insurance 04-30-2015- he has not gotten insurance.     HPI:  Adrian Neal is a 53 y.o. male patient, seen here on 07-14-2018 in a re-referral from Dr. Ethelene Hal for a new sleep apnea evaluation.  Chief complaint according to patient : Now that I have insurance on medicare and want to see if I still need CPAP.     I have the pleasure of seeing this Caucasian right-handed married male patient again after a 3-year 75-month hiatus.  The patient has been seen in late 2016 at the time referred by Dr. Pennie Banter his stroke physician while he was on the inpatient service at [facility].  He had also history of hypertension hyperlipidemia diabetes and he had presented with a history of loud snoring, hypersomnia and fatigue.  His diagnosis was established and a split-night polysomnography on 27 April 2015 AHI was 34.5, REM AHI was at 72/h all sleep in supine position.  He had no periodic limb movements, no prolonged oxygen desaturation, and a stable heart rate in the 80s in normal sinus rhythm.  He was titrated to CPAP between 5 and 12 cmH2O pressure was prescribed but he was unable to obtain the machine as he lost insurance with the beginning of the following calendar year.  9 cmH2O have been his sweet spot in 2012.    Since 2016 he has not been hospitalized with any medical complications, he has carried now a diagnosis of chronic kidney disease stage II, diabetes mellitus diagnosed at age 37 type I, diabetic retinopathy, hypercholesterolemia, hypertension, and a stroke on 01-26-2015 but presented to hospital with CVA in  October 2016.  Wife reports snoring and apnea have gotten worse, now in all positions. He fell asleep in church, snoring loudly !   Sleep habits are as follows: 5 Pm, and bedtime is at 9 PM. He has no difficulties falling asleep. He sleeps on his sides, using one pillow. The bedroom is cool, quiet and dark. He uses an Arboriculturist. One nocturia. Returns to sleep, dreaming some nights. Wakes by alarm by 6.05 AM - brings his 79 year old son to school.    Sleep medical history: see above - stroke, DM1 , HTN, diabetic nephropathy , retinopathy, vasculopathy.   Family sleep history: he has 4 brothers , none has apnea. Parents - mother does to have OSA, father deceased at age 41 of MI. Mother has HTN, high cholesterol. Twin brother had a stroke, DM 1 DX at age 50 (years of age),  who had pancreatic and renal transplant.    Social history: married,  1 son who is 90 years old. non smoker, non drinker, caffeine use - diet sodas with meals, 4 a day . 1 cup of coffee on Sunday.  Review of Systems: Out of a complete 14 system review, the patient complains of only the following symptoms, and all other reviewed systems are negative.  Snoring, vision impairment, CVA residual. HTN.     Epworth score : 16/ 24 - not really refreshed from sleep.  Fatigue severity score:  48/ 63 (!) high - depression score : N/A denied.    Social History   Socioeconomic History  . Marital status: Married    Spouse name: Not on file  . Number of children: 3  . Years of education: Not on file  . Highest education level: Not on file  Occupational History  . Occupation: Disabled  Social Needs  . Financial resource strain: Not on file  . Food insecurity:    Worry: Not on file    Inability: Not on file  . Transportation needs:    Medical: Not on file    Non-medical: Not on file  Tobacco Use  . Smoking status: Former Smoker    Packs/day: 1.00    Years: 6.00    Pack years: 6.00    Last attempt to quit:  04/30/1991    Years since quitting: 27.2  . Smokeless tobacco: Never Used  Substance and Sexual Activity  . Alcohol use: No  . Drug use: Never  . Sexual activity: Not on file  Lifestyle  . Physical activity:    Days per week: Not on file    Minutes per session: Not on file  . Stress: Not on file  Relationships  . Social connections:    Talks on phone: Not on file    Gets together: Not on file    Attends religious service: Not on file    Active member of club or organization: Not on file    Attends meetings of clubs or organizations: Not on file    Relationship status: Not on file  . Intimate partner violence:    Fear of current or ex partner: Not on file    Emotionally abused: Not on file    Physically abused: Not on file    Forced sexual activity: Not on file  Other Topics Concern  . Not on file  Social History Narrative  . Not on file    Family History  Problem Relation Age of Onset  . Hypertension Mother   . Hyperlipidemia Mother   . Hyperlipidemia Father   . Hypertension Father   . Diabetes Father   . Kidney disease Father        had a kidney transplant   . Stroke Brother   . Diabetes Brother   . Pulmonary fibrosis Paternal Grandmother   . Lung cancer Paternal Grandfather   . Diabetes Daughter   . Colon cancer Neg Hx   . Esophageal cancer Neg Hx   . Rectal cancer Neg Hx   . Stomach cancer Neg Hx     Past Medical History:  Diagnosis Date  . Cataract 1994   right eye  . Chronic kidney disease   . Diabetes mellitus without complication ([place])    diagnosed at age 69  . Eye problems   . Heart murmur 1996  . High cholesterol    patient denies but take preventative medicine  . Hypertension   . Retinopathy due to secondary diabetes mellitus ([place])    right  . Sleep apnea    currently not one but looking to get one   . Stroke [facility]) 2016    Past Surgical History:  Procedure Laterality Date  . CATARACT EXTRACTION Right 1994  . CATARACT EXTRACTION W/  INTRAOCULAR LENS IMPLANT  1994  . EYE SURGERY Right 1991   vitrectomy  . EYE SURGERY Right 1992   scar tissue removed from retina    Current Outpatient Medications  Medication Sig Dispense Refill  .  amLODipine (NORVASC) 10 MG tablet Take 1 tablet (10 mg total) by mouth daily. 90 tablet 0  . atorvastatin (LIPITOR) 40 MG tablet Take 1 tablet (40 mg total) by mouth daily at 6 PM. 90 tablet 0  . carvedilol (COREG) 25 MG tablet Take 1 tablet (25 mg total) by mouth 2 (two) times daily with a meal. 60 tablet 0  . clopidogrel (PLAVIX) 75 MG tablet Take 1 tablet (75 mg total) by mouth daily. Aspirin and Plavix for 3 months, after that-stop Aspirin-take Plavix alone 90 tablet 1  . insulin aspart (NOVOLOG) 100 UNIT/ML injection Max daily dose of 65 units via pump  DX E10.59 10 mL 11  . Insulin Human (INSULIN PUMP) SOLN Inject 0-2 each into the skin every 4 (four) hours.    Marland Kitchen lisinopril (PRINIVIL,ZESTRIL) 20 MG tablet Take 1 tablet (20 mg total) by mouth daily. 90 tablet 0  . meclizine (ANTIVERT) 25 MG tablet Take 1 tablet (25 mg total) by mouth 3 (three) times daily as needed for dizziness. 30 tablet 1  . pantoprazole (PROTONIX) 40 MG tablet TAKE ONE TABLET BY MOUTH ONCE DAILY 30 tablet 0  . Vitamin D, Ergocalciferol, (DRISDOL) 1.25 MG (50000 UT) CAPS capsule Take 1 capsule (50,000 Units total) by mouth every 7 (seven) days. 5 capsule 5   No current facility-administered medications for this visit.     Allergies as of 07/14/2018  . (No Known Allergies)    Vitals: BP (!) 153/76   Pulse 80   Temp (!) 97.5 F (36.4 C)   Ht 5\' 8"  (1.727 m)   Wt 193 lb (87.5 kg)   BMI 29.35 kg/m  Last Weight:  Wt Readings from Last 1 Encounters:  07/14/18 193 lb (87.5 kg)   ZOX:WRUE mass index is 29.35 kg/m.     Last Height:   Ht Readings from Last 1 Encounters:  07/14/18 5\' 8"  (1.727 m)    Physical exam:  General: The patient is awake, alert and appears not in acute distress. The patient is well  groomed.  Head: Normocephalic, atraumatic. Neck is supple. Mallampati: 5  neck circumference:18". Nasal airflow patent,   Retrognathia is not seen.  Cardiovascular:  Regular rate and rhythm , without  murmurs or carotid bruit, and without distended neck veins. Respiratory: Lungs are clear to auscultation. Skin:  With edema at ankle . Trunk: BMI is 30. The patient's posture is erect.  Neurologic exam : The patient is awake and alert, oriented to place and time.  Attention span & concentration ability appears normal.  Speech is fluent,  without  dysarthria, dysphonia or aphasia.  Mood and affect are appropriate.  Cranial nerves: Pupils are equal and briskly reactive to light.  Extraocular movements  in vertical and horizontal planes intact and without nystagmus. Visual fields by finger perimetry are intact. Hearing to finger rub intact.   Facial sensation intact to fine touch.  Facial motor strength is symmetric and tongue and uvula move midline. Shoulder shrug was symmetrical.   Motor exam:Normal tone, muscle bulk and symmetric strength in all extremities.  Sensory:  Fine touch, pinprick and vibration were tested in all extremities. Proprioception tested in the upper extremities was normal. No pronation, but patient reports decreased sensation to vibration.  Coordination: Rapid alternating movements in the fingers/hands was normal. Finger-to-nose maneuver normal without evidence of ataxia, dysmetria or tremor.  Gait and station: Patient walks without assistive device - Strength within normal limits. Stance is stable and normal.  Deep tendon reflexes:  in the  upper and lower extremities are symmetric and intact.    Assessment:  After physical and neurologic examination, review of laboratory studies,  Personal review of imaging studies, reports of other /same  Imaging studies, results of polysomnography and / or neurophysiology testing and pre-existing records as far as provided in  visit., my assessment is:  Mr. Raz is a meanwhile 53 year old patient with longstanding DM 1, insulin dependent for over 45 years.  He has described vision changes due to diabetic retinopathy, 1990 he had an eye bleed.  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  He suffered a stroke in 2016 and lost insurance right after OSA was diagnosed.   He is excessively fatigued and daytime sleepy and will need to undergo retesting.  1) HST for OSA evaluation unless he qualifies for in bed laboratory study.   2) No pulmonary dysfunction, tachypnoea and no asthma history.   3) good sleep hygiene , no insomnia complaint.    The patient was advised of the nature of the diagnosed disorder , the treatment options and the  risks for general health and wellness arising from not treating the condition.   I spent more than 35 minutes of face to face time with the patient.  Greater than 50% of time was spent in counseling and coordination of care. We have discussed the diagnosis and differential and I answered the patient's questions.    Plan:  Treatment plan and additional workup : HST with the goal to get CPAP is positive.     Larey Seat, MD 3/81/0175, 10:25 AM  Certified in Neurology by ABPN Certified in [place] by [facility] [address] [place], [place] [address]

## 2018-07-15 ENCOUNTER — Encounter: Payer: Self-pay | Admitting: Family Medicine

## 2018-07-15 ENCOUNTER — Ambulatory Visit (INDEPENDENT_AMBULATORY_CARE_PROVIDER_SITE_OTHER): Payer: PPO | Admitting: Family Medicine

## 2018-07-15 ENCOUNTER — Other Ambulatory Visit: Payer: Self-pay

## 2018-07-15 VITALS — BP 140/80 | HR 86 | Temp 98.5°F | Ht 68.0 in | Wt 193.0 lb

## 2018-07-15 DIAGNOSIS — E782 Mixed hyperlipidemia: Secondary | ICD-10-CM | POA: Diagnosis not present

## 2018-07-15 DIAGNOSIS — Z23 Encounter for immunization: Secondary | ICD-10-CM

## 2018-07-15 DIAGNOSIS — N183 Chronic kidney disease, stage 3 unspecified: Secondary | ICD-10-CM

## 2018-07-15 DIAGNOSIS — E039 Hypothyroidism, unspecified: Secondary | ICD-10-CM | POA: Diagnosis not present

## 2018-07-15 DIAGNOSIS — I10 Essential (primary) hypertension: Secondary | ICD-10-CM

## 2018-07-15 DIAGNOSIS — E559 Vitamin D deficiency, unspecified: Secondary | ICD-10-CM

## 2018-07-15 MED ORDER — LISINOPRIL 40 MG PO TABS: 40.0000 mg | ORAL_TABLET | Freq: Every day | ORAL | 1 refills | Status: DC

## 2018-07-15 NOTE — Patient Instructions (Signed)
DASH Eating Plan DASH stands for "Dietary Approaches to Stop Hypertension." The DASH eating plan is a healthy eating plan that has been shown to reduce high blood pressure (hypertension). It may also reduce your risk for type 2 diabetes, heart disease, and stroke. The DASH eating plan may also help with weight loss. What are tips for following this plan?  General guidelines  Avoid eating more than 2,300 mg (milligrams) of salt (sodium) a day. If you have hypertension, you may need to reduce your sodium intake to 1,500 mg a day.  Limit alcohol intake to no more than 1 drink a day for nonpregnant women and 2 drinks a day for men. One drink equals 12 oz of beer, 5 oz of wine, or 1 oz of hard liquor.  Work with your health care provider to maintain a healthy body weight or to lose weight. Ask what an ideal weight is for you.  Get at least 30 minutes of exercise that causes your heart to beat faster (aerobic exercise) most days of the week. Activities may include walking, swimming, or biking.  Work with your health care provider or diet and nutrition specialist (dietitian) to adjust your eating plan to your individual calorie needs. Reading food labels   Check food labels for the amount of sodium per serving. Choose foods with less than 5 percent of the Daily Value of sodium. Generally, foods with less than 300 mg of sodium per serving fit into this eating plan.  To find whole grains, look for the word "whole" as the first word in the ingredient list. Shopping  Buy products labeled as "low-sodium" or "no salt added."  Buy fresh foods. Avoid canned foods and premade or frozen meals. Cooking  Avoid adding salt when cooking. Use salt-free seasonings or herbs instead of table salt or sea salt. Check with your health care provider or pharmacist before using salt substitutes.  Do not fry foods. Cook foods using healthy methods such as baking, boiling, grilling, and broiling instead.  Cook with  heart-healthy oils, such as olive, canola, soybean, or sunflower oil. Meal planning  Eat a balanced diet that includes: ? 5 or more servings of fruits and vegetables each day. At each meal, try to fill half of your plate with fruits and vegetables. ? Up to 6-8 servings of whole grains each day. ? Less than 6 oz of lean meat, poultry, or fish each day. A 3-oz serving of meat is about the same size as a deck of cards. One egg equals 1 oz. ? 2 servings of low-fat dairy each day. ? A serving of nuts, seeds, or beans 5 times each week. ? Heart-healthy fats. Healthy fats called Omega-3 fatty acids are found in foods such as flaxseeds and coldwater fish, like sardines, salmon, and mackerel.  Limit how much you eat of the following: ? Canned or prepackaged foods. ? Food that is high in trans fat, such as fried foods. ? Food that is high in saturated fat, such as fatty meat. ? Sweets, desserts, sugary drinks, and other foods with added sugar. ? Full-fat dairy products.  Do not salt foods before eating.  Try to eat at least 2 vegetarian meals each week.  Eat more home-cooked food and less restaurant, buffet, and fast food.  When eating at a restaurant, ask that your food be prepared with less salt or no salt, if possible. What foods are recommended? The items listed may not be a complete list. Talk with your dietitian about   what dietary choices are best for you. Grains Whole-grain or whole-wheat bread. Whole-grain or whole-wheat pasta. Brown rice. Oatmeal. Quinoa. Bulgur. Whole-grain and low-sodium cereals. Pita bread. Low-fat, low-sodium crackers. Whole-wheat flour tortillas. Vegetables Fresh or frozen vegetables (raw, steamed, roasted, or grilled). Low-sodium or reduced-sodium tomato and vegetable juice. Low-sodium or reduced-sodium tomato sauce and tomato paste. Low-sodium or reduced-sodium canned vegetables. Fruits All fresh, dried, or frozen fruit. Canned fruit in natural juice (without  added sugar). Meat and other protein foods Skinless chicken or turkey. Ground chicken or turkey. Pork with fat trimmed off. Fish and seafood. Egg whites. Dried beans, peas, or lentils. Unsalted nuts, nut butters, and seeds. Unsalted canned beans. Lean cuts of beef with fat trimmed off. Low-sodium, lean deli meat. Dairy Low-fat (1%) or fat-free (skim) milk. Fat-free, low-fat, or reduced-fat cheeses. Nonfat, low-sodium ricotta or cottage cheese. Low-fat or nonfat yogurt. Low-fat, low-sodium cheese. Fats and oils Soft margarine without trans fats. Vegetable oil. Low-fat, reduced-fat, or light mayonnaise and salad dressings (reduced-sodium). Canola, safflower, olive, soybean, and sunflower oils. Avocado. Seasoning and other foods Herbs. Spices. Seasoning mixes without salt. Unsalted popcorn and pretzels. Fat-free sweets. What foods are not recommended? The items listed may not be a complete list. Talk with your dietitian about what dietary choices are best for you. Grains Baked goods made with fat, such as croissants, muffins, or some breads. Dry pasta or rice meal packs. Vegetables Creamed or fried vegetables. Vegetables in a cheese sauce. Regular canned vegetables (not low-sodium or reduced-sodium). Regular canned tomato sauce and paste (not low-sodium or reduced-sodium). Regular tomato and vegetable juice (not low-sodium or reduced-sodium). Pickles. Olives. Fruits Canned fruit in a light or heavy syrup. Fried fruit. Fruit in cream or butter sauce. Meat and other protein foods Fatty cuts of meat. Ribs. Fried meat. Bacon. Sausage. Bologna and other processed lunch meats. Salami. Fatback. Hotdogs. Bratwurst. Salted nuts and seeds. Canned beans with added salt. Canned or smoked fish. Whole eggs or egg yolks. Chicken or turkey with skin. Dairy Whole or 2% milk, cream, and half-and-half. Whole or full-fat cream cheese. Whole-fat or sweetened yogurt. Full-fat cheese. Nondairy creamers. Whipped toppings.  Processed cheese and cheese spreads. Fats and oils Butter. Stick margarine. Lard. Shortening. Ghee. Bacon fat. Tropical oils, such as coconut, palm kernel, or palm oil. Seasoning and other foods Salted popcorn and pretzels. Onion salt, garlic salt, seasoned salt, table salt, and sea salt. Worcestershire sauce. Tartar sauce. Barbecue sauce. Teriyaki sauce. Soy sauce, including reduced-sodium. Steak sauce. Canned and packaged gravies. Fish sauce. Oyster sauce. Cocktail sauce. Horseradish that you find on the shelf. Ketchup. Mustard. Meat flavorings and tenderizers. Bouillon cubes. Hot sauce and Tabasco sauce. Premade or packaged marinades. Premade or packaged taco seasonings. Relishes. Regular salad dressings. Where to find more information:  National Heart, Lung, and Blood Institute: www.nhlbi.nih.gov  American Heart Association: www.heart.org Summary  The DASH eating plan is a healthy eating plan that has been shown to reduce high blood pressure (hypertension). It may also reduce your risk for type 2 diabetes, heart disease, and stroke.  With the DASH eating plan, you should limit salt (sodium) intake to 2,300 mg a day. If you have hypertension, you may need to reduce your sodium intake to 1,500 mg a day.  When on the DASH eating plan, aim to eat more fresh fruits and vegetables, whole grains, lean proteins, low-fat dairy, and heart-healthy fats.  Work with your health care provider or diet and nutrition specialist (dietitian) to adjust your eating plan to your   individual calorie needs. This information is not intended to replace advice given to you by your health care provider. Make sure you discuss any questions you have with your health care provider. Document Released: 04/04/2011 Document Revised: 04/08/2016 Document Reviewed: 04/08/2016 Elsevier Interactive Patient Education  2019 [employer].  Preventing Disease Through Immunization Immunization means developing a lower risk of  getting a disease due to improvements in the body's disease-fighting system (immune system). Immunization can happen through:  Natural exposure to a disease.  Getting shots (vaccination). Vaccination involves putting a small amount of germs (vaccines) into the body. This may be done through one or more shots. Some vaccines can be given by mouth or as a nasal spray, instead of a shot. Vaccination helps to prevent:  Serious diseases such as polio, measles, and whooping cough.  Common infections, such as the flu. Vaccination starts at birth. Teens and adults also need vaccines regularly. Talk with your health care provider about the immunization schedule that is best for you. Some vaccines need to be repeated when you are older. How does immunization prevent disease? Immunization occurs when the body is exposed to germs that cause a certain disease. The body responds to this exposure by forming proteins (antibodies) to fight those germs. Germs in vaccines are dead or very weak, so they will not make you sick. However, the antibodies that your body makes will stay in your body for a long time. This improves the ability of your immune system to fight the germs in the future. If you get exposed to the germs again, you may be able to resist them (develop immunity against them). This is because your antibodies may be able to destroy the germs before you get sick. Why should I prevent diseases through immunization? Vaccines can protect you from getting diseases that can cause harmful complications and even death. Getting vaccinated also helps to keep other people healthy. If you are vaccinated, you cannot spread disease to others, and that can make the disease become less common. If people keep getting vaccinated, certain diseases may become rare or go away. If people stop getting vaccinated, certain diseases could become more common. Not everyone can get a vaccine. Very young babies, people who are very sick,  or older people may not be able to get vaccines. By getting immunized, you help to protect people who are not able to be vaccinated. Where to find more information To learn more about immunization, visit:  World Health Organization: https://www.davis-walter.com/  Centers for Disease Control and Prevention: http://www.murphy.com/ Summary  Immunization occurs when the body is exposed to germs that cause a certain disease and responds by forming proteins (antibodies) to fight those germs.  Getting vaccines is a safe and effective way to develop immunity against specific germs and the diseases that they cause.  Talk with your health care provider about your immunization schedule, and stay up to date with all of your shots. This information is not intended to replace advice given to you by your health care provider. Make sure you discuss any questions you have with your health care provider. Document Released: 06/07/2015 Document Revised: 12/23/2015 Document Reviewed: 12/23/2015 Elsevier Interactive Patient Education  2019 [employer].

## 2018-07-15 NOTE — Progress Notes (Signed)
Established Patient Office Visit  Subjective:  Patient ID: Adrian Neal, male    DOB: 08-20-1965  Age: 53 y.o. MRN: [id number]  CC:  Chief Complaint  Patient presents with  . Annual Exam    HPI Adrian Neal presents for follow-up of his blood pressure.  He brings in extensive list and he continues to run in the 1 40-1 60 range over 90s to 100.  He is tolerating the lisinopril Coreg and amlodipine well.  Being set up for the a.m. insulin pump through endocrinology.  Close to obtaining a CPAP machine.  He is status post colonoscopy.  Strongly advised flu vaccine today with follow-up pneumonia vaccines during future visits.  Patient is taking high-dose vitamin D.  Past Medical History:  Diagnosis Date  . Cataract 1994   right eye  . Chronic kidney disease   . Diabetes mellitus without complication ([place])    diagnosed at age 43  . Eye problems   . Heart murmur 1996  . High cholesterol    patient denies but take preventative medicine  . Hypertension   . 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 Sleep apnea    currently not one but looking to get one   . Stroke [facility]) 2016    Past Surgical History:  Procedure Laterality Date  . CATARACT EXTRACTION Right 1994  . CATARACT EXTRACTION W/ INTRAOCULAR LENS IMPLANT  1994  . EYE SURGERY Right 1991   vitrectomy  . EYE SURGERY Right 1992   scar tissue removed from retina    Family History  Problem Relation Age of Onset  . Hypertension Mother   . Hyperlipidemia Mother   . Hyperlipidemia Father   . Hypertension Father   . Diabetes Father   . Kidney disease Father        had a kidney transplant   . Stroke Brother   . Diabetes Brother   . Pulmonary fibrosis Paternal Grandmother   . Lung cancer Paternal Grandfather   . Diabetes Daughter   . Colon cancer Neg Hx   . Esophageal cancer Neg Hx   . Rectal cancer Neg Hx   . Stomach cancer Neg Hx     Social History   Socioeconomic History  .  Marital status: Married    Spouse name: Not on file  . Number of children: 3  . Years of education: Not on file  . Highest education level: Not on file  Occupational History  . Occupation: Disabled  Social Needs  . Financial resource strain: Not on file  . Food insecurity:    Worry: Not on file    Inability: Not on file  . Transportation needs:    Medical: Not on file    Non-medical: Not on file  Tobacco Use  . Smoking status: Former Smoker    Packs/day: 1.00    Years: 6.00    Pack years: 6.00    Last attempt to quit: 04/30/1991    Years since quitting: 27.2  . Smokeless tobacco: Never Used  Substance and Sexual Activity  . Alcohol use: No  . Drug use: Never  . Sexual activity: Not on file  Lifestyle  . Physical activity:    Days per week: Not on file    Minutes per session: Not on file  . Stress: Not on file  Relationships  . Social connections:    Talks on phone: Not on file    Gets together: Not on  file    Attends religious service: Not on file    Active member of club or organization: Not on file    Attends meetings of clubs or organizations: Not on file    Relationship status: Not on file  . Intimate partner violence:    Fear of current or ex partner: Not on file    Emotionally abused: Not on file    Physically abused: Not on file    Forced sexual activity: Not on file  Other Topics Concern  . Not on file  Social History Narrative  . Not on file    Outpatient Medications Prior to Visit  Medication Sig Dispense Refill  . amLODipine (NORVASC) 10 MG tablet Take 1 tablet (10 mg total) by mouth daily. 90 tablet 0  . atorvastatin (LIPITOR) 40 MG tablet Take 1 tablet (40 mg total) by mouth daily at 6 PM. 90 tablet 0  . carvedilol (COREG) 25 MG tablet Take 1 tablet (25 mg total) by mouth 2 (two) times daily with a meal. 60 tablet 0  . clopidogrel (PLAVIX) 75 MG tablet Take 1 tablet (75 mg total) by mouth daily. Aspirin and Plavix for 3 months, after that-stop  Aspirin-take Plavix alone 90 tablet 1  . insulin aspart (NOVOLOG) 100 UNIT/ML injection Max daily dose of 65 units via pump  DX E10.59 10 mL 11  . Insulin Human (INSULIN PUMP) SOLN Inject 0-2 each into the skin every 4 (four) hours.    . meclizine (ANTIVERT) 25 MG tablet Take 1 tablet (25 mg total) by mouth 3 (three) times daily as needed for dizziness. 30 tablet 1  . pantoprazole (PROTONIX) 40 MG tablet TAKE ONE TABLET BY MOUTH ONCE DAILY 30 tablet 0  . Vitamin D, Ergocalciferol, (DRISDOL) 1.25 MG (50000 UT) CAPS capsule Take 1 capsule (50,000 Units total) by mouth every 7 (seven) days. 5 capsule 5  . lisinopril (PRINIVIL,ZESTRIL) 20 MG tablet Take 1 tablet (20 mg total) by mouth daily. 90 tablet 0   No facility-administered medications prior to visit.     No Known Allergies  ROS Review of Systems  Constitutional: Negative.   HENT: Negative.   Eyes: Negative for photophobia and visual disturbance.  Respiratory: Negative.   Cardiovascular: Negative.   Gastrointestinal: Negative.   Endocrine: Negative for polyphagia and polyuria.  Genitourinary: Negative.   Musculoskeletal: Negative for gait problem and joint swelling.  Skin: Negative for pallor and rash.  Allergic/Immunologic: Negative for immunocompromised state.  Hematological: Does not bruise/bleed easily.  Psychiatric/Behavioral: Negative.       Objective:    Physical Exam  Constitutional: He is oriented to person, place, and time. He appears well-developed and well-nourished. No distress.  HENT:  Head: Normocephalic and atraumatic.  Right Ear: External ear normal.  Left Ear: External ear normal.  Mouth/Throat: Oropharynx is clear and moist. No oropharyngeal exudate.  Eyes: Pupils are equal, round, and reactive to light. Conjunctivae are normal. Right eye exhibits no discharge. Left eye exhibits no discharge. No scleral icterus.  Neck: No JVD present. No tracheal deviation present.  Cardiovascular: Normal rate, regular  rhythm and normal heart sounds.  Pulmonary/Chest: Effort normal and breath sounds normal. No stridor.  Neurological: He is alert and oriented to person, place, and time.  Skin: Skin is warm and dry. He is not diaphoretic.  Psychiatric: He has a normal mood and affect. His behavior is normal.    BP 140/80   Pulse 86   Temp 98.5 F (36.9 C) (Oral)  Ht 5\' 8"  (1.727 m)   Wt 193 lb (87.5 kg)   SpO2 97%   BMI 29.35 kg/m  Wt Readings from Last 3 Encounters:  07/15/18 193 lb (87.5 kg)  07/14/18 193 lb (87.5 kg)  07/02/18 197 lb (89.4 kg)   BP Readings from Last 3 Encounters:  07/15/18 140/80  07/14/18 (!) 153/76  07/02/18 (!) 169/83   Guideline developer:  UpToDate (see UpToDate for funding source) Date Released: June 2014  Health Maintenance Due  Topic Date Due  . FOOT EXAM  11/22/1975  . OPHTHALMOLOGY EXAM  11/22/1975  . HIV Screening  11/21/1980    There are no preventive care reminders to display for this patient.  Lab Results  Component Value Date   TSH 4.41 06/15/2018   Lab Results  Component Value Date   WBC 5.4 06/15/2018   HGB 13.6 06/15/2018   HCT 39.6 06/15/2018   MCV 86.0 06/15/2018   PLT 118.0 (L) 06/15/2018   Lab Results  Component Value Date   NA 140 06/15/2018   K 4.4 06/15/2018   CO2 28 06/15/2018   GLUCOSE 228 (H) 06/15/2018   BUN 29 (H) 06/15/2018   CREATININE 1.90 (H) 06/15/2018   BILITOT 0.5 06/15/2018   ALKPHOS 106 06/15/2018   AST 13 06/15/2018   ALT 24 06/15/2018   PROT 6.5 06/15/2018   ALBUMIN 4.2 06/15/2018   CALCIUM 8.9 06/15/2018   ANIONGAP 7 01/19/2015   GFR 37.33 (L) 06/15/2018   Lab Results  Component Value Date   CHOL 123 06/15/2018   Lab Results  Component Value Date   HDL 37.20 (L) 06/15/2018   Lab Results  Component Value Date   LDLCALC 75 06/15/2018   Lab Results  Component Value Date   TRIG 53.0 06/15/2018   Lab Results  Component Value Date   CHOLHDL 3 06/15/2018   Lab Results  Component Value Date    HGBA1C 8.6 (A) 06/01/2018      Assessment & Plan:   Problem List Items Addressed This Visit      Cardiovascular and Mediastinum   Essential hypertension - Primary   Relevant Medications   lisinopril (PRINIVIL,ZESTRIL) 40 MG tablet     Endocrine   RESOLVED: Hypothyroidism     Genitourinary   CKD (chronic kidney disease) stage 3, GFR 30-59 ml/min (HCC)     Other   HLD (hyperlipidemia)   Relevant Medications   lisinopril (PRINIVIL,ZESTRIL) 40 MG tablet   Vitamin D deficiency    Other Visit Diagnoses    Need for influenza vaccination       Relevant Orders   Flu Vaccine QUAD 36+ mos IM (Completed)      Meds ordered this encounter  Medications  . lisinopril (PRINIVIL,ZESTRIL) 40 MG tablet    Sig: Take 1 tablet (40 mg total) by mouth daily.    Dispense:  90 tablet    Refill:  1    Follow-up: Return in about 3 months (around 10/15/2018).   Have increased lisinopril to 40 mg daily.  Patient will continue Norvasc Coreg.  Follow-up in 3 months.

## 2018-07-16 ENCOUNTER — Other Ambulatory Visit: Payer: Self-pay

## 2018-07-16 ENCOUNTER — Encounter: Payer: Self-pay | Admitting: Gastroenterology

## 2018-07-16 ENCOUNTER — Telehealth: Payer: Self-pay | Admitting: Internal Medicine

## 2018-07-16 MED ORDER — "MINIMED PRO-SET INFUSION 24"" MISC": 1.0000 | 3 refills | Status: DC

## 2018-07-16 MED ORDER — MINIMED RESERVOIR 3ML MISC: 1.0000 | 3 refills | Status: DC

## 2018-07-16 NOTE — Telephone Encounter (Signed)
MEDICATION: Mini-Med Quick Set and 3ML Resevoir (pump supplies)  PHARMACY:  [facility]  IS THIS A 90 DAY SUPPLY : YES  IS PATIENT OUT OF MEDICATION: No  IF NOT; HOW MUCH IS LEFT: 2 weeks left  LAST APPOINTMENT DATE: @3 /07/2018  NEXT APPOINTMENT DATE:@4 /15/2020  DO WE HAVE YOUR PERMISSION TO LEAVE A DETAILED MESSAGE:YES  OTHER COMMENTS:    **Let patient know to contact pharmacy at the end of the day to make sure medication is ready. **  ** Please notify patient to allow 48-72 hours to process**  **Encourage patient to contact the pharmacy for refills or they can request refills through [facility]**

## 2018-07-16 NOTE — Telephone Encounter (Signed)
Pt does not have this in current med list, please advise

## 2018-07-16 NOTE — Telephone Encounter (Signed)
I sent in the prescriptions for both. Please let him know that  I am  Not sure I ordered the right  minimed quick set though, as it comes in different names and tube lengths. So he needs to check before he picks it up.   There's the paradigm quick set, there's the regular quick sets with different tube lenghths etc and the message does not have any specifications about the quick set just the reservoir.    If its not the right quicket, please ask him to call us with the exact name on the box and the tube length.     Thanks

## 2018-07-16 NOTE — Telephone Encounter (Signed)
Spoke to pt and he stated that what was on the box he has is"minimed quickset 23 in" so I called [facility] and lft Olivia Mackie a message to call back so that I can have her pull his previous orders and we can order exactly what pt is requesting

## 2018-07-17 ENCOUNTER — Other Ambulatory Visit: Payer: Self-pay | Admitting: Internal Medicine

## 2018-07-17 MED ORDER — "MINIMED PRO-SET INFUSION 24"" MISC": 1.0000 | 3 refills | Status: DC

## 2018-07-17 NOTE — Telephone Encounter (Signed)
Spoke to [employer] from [facility] and she stated that she had sent an e-mail for their supplier to fax paperwork to our office. She stated that she did not see any correspondence from them so she will have to call them and get back to me.

## 2018-07-20 ENCOUNTER — Telehealth: Payer: Self-pay | Admitting: Internal Medicine

## 2018-07-20 DIAGNOSIS — E1059 Type 1 diabetes mellitus with other circulatory complications: Secondary | ICD-10-CM

## 2018-07-20 NOTE — Telephone Encounter (Signed)
Refill

## 2018-07-27 ENCOUNTER — Other Ambulatory Visit: Payer: Self-pay

## 2018-07-27 ENCOUNTER — Encounter: Payer: Self-pay | Admitting: Neurology

## 2018-07-27 ENCOUNTER — Other Ambulatory Visit: Payer: Self-pay | Admitting: Family Medicine

## 2018-07-27 DIAGNOSIS — I10 Essential (primary) hypertension: Secondary | ICD-10-CM

## 2018-07-27 DIAGNOSIS — R42 Dizziness and giddiness: Secondary | ICD-10-CM

## 2018-07-27 MED ORDER — CARVEDILOL 25 MG PO TABS: 25.0000 mg | ORAL_TABLET | Freq: Two times a day (BID) | ORAL | 2 refills | Status: DC

## 2018-07-27 NOTE — Telephone Encounter (Signed)
Correct we have received auth from [facility] for a in lab study. At this time we are not scheduling in lab studies due to sleep lab being closed and we have no timeline on when it will open back up.

## 2018-07-29 ENCOUNTER — Other Ambulatory Visit: Payer: Self-pay

## 2018-07-29 MED ORDER — PANTOPRAZOLE SODIUM 40 MG PO TBEC: 40.0000 mg | DELAYED_RELEASE_TABLET | Freq: Every day | ORAL | 1 refills | Status: DC

## 2018-08-04 ENCOUNTER — Telehealth: Payer: Self-pay | Admitting: Nutrition

## 2018-08-04 NOTE — Telephone Encounter (Signed)
Discussed with him the need for lab work before ordering his insulin pump.  He agreed to come in tomorrow at [facility].  He was told that his blood sugar needs to be below 225.  He reported good understanding of this

## 2018-08-05 ENCOUNTER — Other Ambulatory Visit: Payer: Self-pay

## 2018-08-05 ENCOUNTER — Other Ambulatory Visit (INDEPENDENT_AMBULATORY_CARE_PROVIDER_SITE_OTHER): Payer: PPO

## 2018-08-05 ENCOUNTER — Telehealth: Payer: Self-pay | Admitting: Internal Medicine

## 2018-08-05 ENCOUNTER — Encounter: Payer: Self-pay | Admitting: Internal Medicine

## 2018-08-05 DIAGNOSIS — N390 Urinary tract infection, site not specified: Secondary | ICD-10-CM | POA: Diagnosis not present

## 2018-08-05 DIAGNOSIS — I10 Essential (primary) hypertension: Secondary | ICD-10-CM | POA: Diagnosis not present

## 2018-08-05 DIAGNOSIS — K5732 Diverticulitis of large intestine without perforation or abscess without bleeding: Secondary | ICD-10-CM | POA: Diagnosis not present

## 2018-08-05 DIAGNOSIS — E1142 Type 2 diabetes mellitus with diabetic polyneuropathy: Secondary | ICD-10-CM | POA: Diagnosis not present

## 2018-08-05 DIAGNOSIS — I2510 Atherosclerotic heart disease of native coronary artery without angina pectoris: Secondary | ICD-10-CM | POA: Diagnosis not present

## 2018-08-05 DIAGNOSIS — E785 Hyperlipidemia, unspecified: Secondary | ICD-10-CM | POA: Diagnosis not present

## 2018-08-05 DIAGNOSIS — Z780 Asymptomatic menopausal state: Secondary | ICD-10-CM | POA: Diagnosis not present

## 2018-08-05 DIAGNOSIS — C50911 Malignant neoplasm of unspecified site of right female breast: Secondary | ICD-10-CM | POA: Diagnosis not present

## 2018-08-05 DIAGNOSIS — E1059 Type 1 diabetes mellitus with other circulatory complications: Secondary | ICD-10-CM

## 2018-08-05 LAB — GLUCOSE, RANDOM: Glucose, Bld: 71 mg/dL (ref 70–99)

## 2018-08-05 MED ORDER — INSULIN ASPART 100 UNIT/ML ~~LOC~~ SOLN: SUBCUTANEOUS | 11 refills | Status: DC

## 2018-08-05 NOTE — Telephone Encounter (Signed)
Ok to change rx, print and send?

## 2018-08-05 NOTE — Telephone Encounter (Signed)
Patent sated that he has been using [facility] before to receive his insulin. He has found out that he is still eligable to receive his insulin through them He would like to see if Dr Kelton Pillar could send in a prescription to them so he could use [facility] care needs it to indicated to ship to patients home and put daily usage (100 units per day)   Please advise  [phone]

## 2018-08-05 NOTE — Telephone Encounter (Signed)
Patient would like a my chart message or call once this is done.

## 2018-08-06 NOTE — Telephone Encounter (Signed)
Spoke to pt to clarify, he needs rx faxed to [facility] for novolog, [facility] will fax CMN and request for last OV, Vaughan Basta is ordering T-slim for pt which he had labs for yesterday and he has upcoming appt on 08/12/18 pt agreed to do Doxy visit.

## 2018-08-09 LAB — GLUTAMIC ACID DECARBOXYLASE AUTO ABS: Glutamic Acid Decarb Ab: >250 IU/mL — ABNORMAL HIGH (ref <5)

## 2018-08-09 LAB — C-PEPTIDE: C-Peptide: <0.1 ng/mL — ABNORMAL LOW (ref 0.80–3.85)

## 2018-08-12 ENCOUNTER — Ambulatory Visit (INDEPENDENT_AMBULATORY_CARE_PROVIDER_SITE_OTHER): Payer: PPO | Admitting: Internal Medicine

## 2018-08-12 ENCOUNTER — Other Ambulatory Visit: Payer: Self-pay

## 2018-08-12 ENCOUNTER — Encounter: Payer: Self-pay | Admitting: Internal Medicine

## 2018-08-12 DIAGNOSIS — N183 Chronic kidney disease, stage 3 (moderate): Secondary | ICD-10-CM

## 2018-08-12 DIAGNOSIS — E1022 Type 1 diabetes mellitus with diabetic chronic kidney disease: Secondary | ICD-10-CM | POA: Diagnosis not present

## 2018-08-12 DIAGNOSIS — E10319 Type 1 diabetes mellitus with unspecified diabetic retinopathy without macular edema: Secondary | ICD-10-CM | POA: Diagnosis not present

## 2018-08-12 DIAGNOSIS — E1059 Type 1 diabetes mellitus with other circulatory complications: Secondary | ICD-10-CM

## 2018-08-12 DIAGNOSIS — E1065 Type 1 diabetes mellitus with hyperglycemia: Secondary | ICD-10-CM | POA: Diagnosis not present

## 2018-08-12 MED ORDER — DEXCOM G6 TRANSMITTER MISC: 1.0000 | 6 refills | Status: DC

## 2018-08-12 MED ORDER — DEXCOM G6 SENSOR MISC: 3.0000 | 6 refills | Status: DC

## 2018-08-12 MED ORDER — DEXCOM G6 RECEIVER DEVI: 1.0000 | 0 refills | Status: DC

## 2018-08-12 NOTE — Progress Notes (Addendum)
Virtual Visit via Video Note  I connected with Adrian Neal on 04/15/20at 9:30 AM by a video enabled telemedicine application and verified that I am speaking with the correct person using two identifiers.   I discussed the limitations of evaluation and management by telemedicine and the availability of in person appointments. The patient expressed understanding and agreed to proceed.   -Location of the patient : Home  -Location of the provider : Office  -The names of all persons participating in the telemedicine service : [place]        Name: Adrian Neal  Age/ Sex: 53 y.o., male   MRN/ DOB: [id number], [birth year]     PCP: [name], MD   Reason for Endocrinology Evaluation: Type 1 Diabetes Mellitus  Initial Endocrine Consultative Visit: 06/18/2018    PATIENT IDENTIFIER: Adrian Neal is a 53 y.o. male with a past medical history of .HTN,Hyperlipidemia and Hx of CVA  The patient has followed with Endocrinology clinic since 06/18/2018 for consultative assistance with management of his diabetes.  DIABETIC HISTORY:  Adrian Neal was diagnosed with T1DM at age 54, he has been on insulin pump since 2012. His hemoglobin A1c has ranged from 7.2% , peaking at 9.0%    SUBJECTIVE:   During the last visit (07/01/2018): We continued basal rate at 1.0 u/hr and changed his I: C to 1:11 and continued sensitivity factor at  35  Today (08/12/2018): Adrian Neal is here for a 6 week virtual check-in on diabetes management.  He checks his blood sugars 4 times daily, preprandial and bedtime since February, 2020. The patient has had hypoglycemic episodes since the last clinic visit, which typically occur 1 x /week - most often occuring during the early morning hours. The patient is symptomatic with these episodes.. Otherwise, the patient has not required any recent emergency interventions for hypoglycemia and has not had recent hospitalizations secondary to  hyper or hypoglycemic episodes.     He saw our CDE Ms. Spagnolia and discussed his wishes of having a T-slim but since his current [employer] pump is under warranty (had it 1.5 yrs ago) his insurance would like for him to go through the warranty. He is not interested in having a new [employer] pump and would rather have the Dexcom    This patient with type 1 diabetes is treated with humalog (insulin pump). During the visit we were unable to remotely download the  pump basal and bolus doses. The clinical list was updated.   Current pump settings    Pump   METRONIC     Insulin type   HUMALOG    Basal rate       0000-0000  1.0 u/h               I:C ratio       0000-0000  1:11                  Sensitivity       0000  35       Goal       0000  100-120           Type & Model of Pump: [employer] Insulin Type: Currently using Humalog    GLUCOSE LOG:  Date  Breakfast  Lunch  Supper Bedtime   08/12/18 67     4/14 392 179 440 362  4/13 198 78 148 271      ROS: As per HPI and as  detailed below: Review of Systems  Constitutional: Negative for fever and weight loss.  Respiratory: Negative for cough and shortness of breath.   Cardiovascular: Negative for chest pain and palpitations.  Gastrointestinal: Negative for diarrhea and nausea.      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 He has a 6.00 pack-year smoking history. He has never used smokeless tobacco. He reports that he does not drink alcohol or use drugs. Family History:  Family History  Problem Relation Age of Onset   Hypertension Mother    Hyperlipidemia Mother    Hyperlipidemia Father    Hypertension Father    Diabetes Father    Kidney disease Father        had a kidney transplant    Stroke Brother    Diabetes Brother    Pulmonary fibrosis Paternal Grandmother    Lung cancer Paternal Grandfather    Diabetes Daughter    Colon cancer Neg Hx    Esophageal cancer Neg Hx    Rectal cancer Neg Hx    Stomach cancer Neg Hx      HOME MEDICATIONS: Allergies as of 08/12/2018   No Known Allergies     Medication List       Accurate as of August 12, 2018 10:54 AM. Always use your most recent med list.        amLODipine 10 MG tablet Commonly known as:  NORVASC Take 1 tablet (10 mg total) by mouth daily.   atorvastatin 40 MG tablet Commonly known as:  LIPITOR Take 1 tablet (40 mg total) by mouth daily at 6 PM.   carvedilol 25 MG tablet Commonly known as:  COREG Take 1 tablet (25 mg total) by mouth 2 (two) times daily with a meal.   clopidogrel 75 MG tablet Commonly known as:  PLAVIX Take 1 tablet (75 mg total) by mouth daily. Aspirin and Plavix for 3 months, after that-stop Aspirin-take Plavix alone   Dexcom G6 Receiver Devi 1 Device by Does not apply route as directed.   Dexcom G6 Sensor Misc 3 Devices by Does not apply route as directed.   Dexcom G6 Transmitter Misc 1 Device by Does not apply route as directed.   insulin aspart 100 UNIT/ML injection Commonly known as:  NovoLOG Max daily dose of 100 units via pump  DX E10.59   insulin pump Soln Inject 0-2 each into the skin every 4  (four) hours.   lisinopril 40 MG tablet Commonly known as:  PRINIVIL,ZESTRIL Take 1 tablet (40 mg total) by mouth daily.   meclizine 25  MG tablet Commonly known as:  ANTIVERT TAKE 1 TABLET BY MOUTH THREE TIMES DAILY AS NEEDED FOR  DIZZINESS   MiniMed Reservoir 4ml Misc 1 Device by Does not apply route every 3 (three) days.   MiniMed Pro-Set Infusion 24" Misc 1 Device by Does not apply route every 3 (three) days.   MiniMed Pro-Set Infusion 24" Misc 1 Device by Does not apply route every 3 (three) days.   pantoprazole 40 MG tablet Commonly known as:  PROTONIX Take 1 tablet (40 mg total) by mouth daily.   Vitamin D (Ergocalciferol) 1.25 MG (50000 UT) Caps capsule Commonly known as:  DRISDOL Take 1 capsule (50,000 Units total) by mouth every 7 (seven) days.         DATA REVIEWED:  Lab Results  Component Value Date   HGBA1C 8.6 (A) 06/01/2018   HGBA1C 7.7 (H) 01/20/2015   HGBA1C 7.5 (H) 01/19/2015   Lab Results  Component Value Date   MICROALBUR 71.1 (H) 06/15/2018   LDLCALC 75 06/15/2018   CREATININE 1.90 (H) 06/15/2018   Lab Results  Component Value Date   MICRALBCREAT 118.9 (H) 06/15/2018    Lab Results  Component Value Date   CHOL 123 06/15/2018   HDL 37.20 (L) 06/15/2018   LDLCALC 75 06/15/2018   TRIG 53.0 06/15/2018   CHOLHDL 3 06/15/2018         ASSESSMENT / PLAN / RECOMMENDATIONS:   1) Type 1 Diabetes Mellitus, Poorly controlled, With macrovascular , CKD III and retinopathic complications - Most recent A1c of 8.6 %. Goal A1c < 7.5 %.     Plan:  - Pt continues to do better with checking his glucose and using the pump settings.  - He is unable to obtain the T-slim as his current [facility] is still under warranty and insurance won't cover for a new one at this time.  - He did have a fasting hypoglycemia this am but he attributes this to manually bolusing last night at bedtime . Pt again discouraged from over-riding the pump setting and to follow  the bolus wizard. He was encouraged to contact us with issues about his pump so we can work with him  Rather then him guessing on the insulin dose.  - Will send a prescription for a Dexcom G6 .  - I am not going to adjust his basal rat eat this time, as his hypoglycemia is due to manually bolusing last night.   We have made the following changes to his pump    MEDICATIONS: Pump   METRONIC     Insulin type   HUMALOG    Basal rate       0000-0000   1.0u/h               I:C ratio       0000-1100  1:11   [id number] 1:9              Sensitivity       0000  30      Goal       0000  100-120         EDUCATION / INSTRUCTIONS:  BG monitoring instructions: Patient is instructed to check his blood sugars 4 times a day, before meals and bedtime.  Call [place] [facility] if: BG persistently < 70 or > 300.  I reviewed the Rule of 15 for the treatment of hypoglycemia in detail with the patient. Literature supplied.  I discussed the assessment and treatment plan  with the patient. The patient was provided an opportunity to ask questions and all were answered. The patient agreed with the plan and demonstrated an understanding of the instructions.   The patient was advised to call back or seek an in-person evaluation if the symptoms worsen or if the condition fails to improve as anticipated.   F/U in 3 months    Signed electronically by: Mack Guise, MD  [facility] End[facility]  [facility] [address] [place],  [address] Phone: [phone] FAX: [phone]   CC: [name], [place] [address] Phone: [phone]  Fax: [phone]  Return to Endocrinology clinic as below: Future Appointments  Date Time Provider [place]  10/27/2018  8:30 AM [name], MD [facility]  01/07/2019  9:15 AM [name], MD [facility] None

## 2018-08-17 ENCOUNTER — Encounter: Payer: Self-pay | Admitting: Internal Medicine

## 2018-08-27 ENCOUNTER — Encounter: Payer: Self-pay | Admitting: Internal Medicine

## 2018-08-31 ENCOUNTER — Encounter: Payer: Self-pay | Admitting: Internal Medicine

## 2018-09-02 ENCOUNTER — Telehealth: Payer: Self-pay | Admitting: Physical Medicine & Rehabilitation

## 2018-09-02 NOTE — Telephone Encounter (Signed)
PT came in with disability form that you fill out every year. He has not been seen since 05/2017. Do you want him to schedule an appointment to get form filled out?

## 2018-09-03 ENCOUNTER — Encounter: Payer: Self-pay | Admitting: Internal Medicine

## 2018-09-03 NOTE — Telephone Encounter (Signed)
Yes he needs a visit for [place] to fill out such a form

## 2018-09-04 DIAGNOSIS — E1165 Type 2 diabetes mellitus with hyperglycemia: Secondary | ICD-10-CM | POA: Diagnosis not present

## 2018-09-08 ENCOUNTER — Telehealth: Payer: Self-pay | Admitting: Internal Medicine

## 2018-09-08 NOTE — Telephone Encounter (Signed)
Patient states that [facility] needs clarification on his RX. States to call # [phone]  Please message patient with progress on MyChart. Please Advise, Thanks

## 2018-09-09 ENCOUNTER — Other Ambulatory Visit: Payer: Self-pay

## 2018-09-09 ENCOUNTER — Encounter: Payer: Self-pay | Admitting: Physical Medicine & Rehabilitation

## 2018-09-09 ENCOUNTER — Encounter: Payer: PPO | Attending: Physical Medicine & Rehabilitation | Admitting: Physical Medicine & Rehabilitation

## 2018-09-09 VITALS — BP 162/78 | HR 86 | Temp 98.0°F | Ht 68.0 in | Wt 197.0 lb

## 2018-09-09 DIAGNOSIS — R270 Ataxia, unspecified: Secondary | ICD-10-CM | POA: Insufficient documentation

## 2018-09-09 DIAGNOSIS — I6302 Cerebral infarction due to thrombosis of basilar artery: Secondary | ICD-10-CM | POA: Insufficient documentation

## 2018-09-09 MED ORDER — INSULIN LISPRO 100 UNIT/ML ~~LOC~~ SOLN: SUBCUTANEOUS | 11 refills | Status: DC

## 2018-09-09 NOTE — Progress Notes (Signed)
Subjective:    Patient ID: Adrian Neal, male    DOB: 1965/10/09, 53 y.o.   MRN: [id number]  HPI   Adrian Neal is here in follow up of his CVA and associated deficits. I last saw him a year ago. He tries to stay as active as he can with his balance deficits. He walks some every day but is limited due to his balance/stamina. He hasn't had any falls. He hasn't noticed any changes in vision although he has blurred vision at times when he's fatigued. He is not using any adaptive equipment to walk at this point but he needs rest breaks frequently.   His sugar still need some work. He has an endocrinologist who follows him. He is looking at an insulin pump.        Pain Inventory Average Pain 0 Pain Right Now 0 My pain is na  In the last 24 hours, has pain interfered with the following? General activity 0 Relation with others 0 Enjoyment of life 0 What TIME of day is your pain at its worst? na Sleep (in general) Fair  Pain is worse with: na Pain improves with: na Relief from Meds: na  Mobility walk without assistance use a cane ability to climb steps?  yes do you drive?  yes  Function disabled: date disabled 2016  Neuro/Psych No problems in this area  Prior Studies Any changes since last visit?  no  Physicians involved in your care Any changes since last visit?  no   Family History  Problem Relation Age of Onset  . Hypertension Mother   . Hyperlipidemia Mother   . Hyperlipidemia Father   . Hypertension Father   . Diabetes Father   . Kidney disease Father        had a kidney transplant   . Stroke Brother   . Diabetes Brother   . Pulmonary fibrosis Paternal Grandmother   . Lung cancer Paternal Grandfather   . Diabetes Daughter   . Colon cancer Neg Hx   . Esophageal cancer Neg Hx   . Rectal cancer Neg Hx   . Stomach cancer Neg Hx    Social History   Socioeconomic History  . Marital status: Married    Spouse name: Not on file  . Number of children: 3  .  Years of education: Not on file  . Highest education level: Not on file  Occupational History  . Occupation: Disabled  Social Needs  . Financial resource strain: Not on file  . Food insecurity:    Worry: Not on file    Inability: Not on file  . Transportation needs:    Medical: Not on file    Non-medical: Not on file  Tobacco Use  . Smoking status: Former Smoker    Packs/day: 1.00    Years: 6.00    Pack years: 6.00    Last attempt to quit: 04/30/1991    Years since quitting: 27.3  . Smokeless tobacco: Never Used  Substance and Sexual Activity  . Alcohol use: No  . Drug use: Never  . Sexual activity: Not on file  Lifestyle  . Physical activity:    Days per week: Not on file    Minutes per session: Not on file  . Stress: Not on file  Relationships  . Social connections:    Talks on phone: Not on file    Gets together: Not on file    Attends religious service: Not on file    Active  member of club or organization: Not on file    Attends meetings of clubs or organizations: Not on file    Relationship status: Not on file  Other Topics Concern  . Not on file  Social History Narrative  . Not on file   Past Surgical History:  Procedure Laterality Date  . CATARACT EXTRACTION Right 1994  . CATARACT EXTRACTION W/ INTRAOCULAR LENS IMPLANT  1994  . EYE SURGERY Right 1991   vitrectomy  . EYE SURGERY Right 1992   scar tissue removed from retina   Past Medical History:  Diagnosis Date  . Cataract 1994   right eye  . Chronic kidney disease   . Diabetes mellitus without complication ([place])    diagnosed at age 31  . Eye problems   . Heart murmur 1996  . High cholesterol    patient denies but take preventative medicine  . Hypertension   . 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 Sleep apnea    currently not one but looking to get one   . Stroke [facility]) 2016   There were no vitals taken for this visit.  Opioid Risk Score:   Fall Risk Score:  `1   Depression screen PHQ 2/9  Depression screen [facility] 2/9 08/16/2015 09/19/2014  Decreased Interest 0 0  Down, Depressed, Hopeless 0 0  PHQ - 2 Score 0 0  Altered sleeping - 0  Tired, decreased energy - 0  Change in appetite - 0  Feeling bad or failure about yourself  - 0  Trouble concentrating - 0  Moving slowly or fidgety/restless - 0  Suicidal thoughts - 0  PHQ-9 Score - 0     Review of Systems  Constitutional: Negative.   HENT: Negative.   Eyes: Negative.   Respiratory: Positive for apnea.        Sleep apnea   Cardiovascular: Negative.   Gastrointestinal: Negative.   Endocrine: Negative.   Genitourinary: Negative.   Musculoskeletal: Negative.   Skin: Negative.   Allergic/Immunologic: Negative.   Neurological: Negative.   Hematological: Negative.   Psychiatric/Behavioral: Negative.   All other systems reviewed and are negative.      Objective:   Physical Exam General: No acute distress HEENT: EOMI, oral membranes moist Cards: reg rate  Chest: normal effort Abdomen: Soft, NT, ND Skin: dry, intact Extremities: no edema  Musculoskeletal: He exhibits no edema or tenderness.  Neurological: He is alert and oriented to person, place, and time.  Strengthis4+/5. Ataxic left upper and left lower. Drifts quickly to left when ambulating. Tandem gait very difficult. Visual acuity functional for  Basic vision today  Cognitively he is appropriate.    Psychiatric: Calm and appropriate Skin: Skin is warm and dry.  Psychiatric: pleasant  Assessment/Plan:  1. 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 He remains significantly limited by his balance and visual deficits. -deficits limit his vocational ability. 2. Pain Management: N/A  3. CKD: per pcp  4. DM type 1: per primary.  Sugars remain quite labile  See me back PRN. Fifteen minutes of face to face patient care time were  spent during this visit. All questions were encouraged and answered.

## 2018-09-09 NOTE — Telephone Encounter (Signed)
Spoke with Dorothea Ogle with [employer] and clarified that they only supply Humalog, not Novolog.  Since these are interchangeable verbal order was given. Epic updated to reflect this change.    Will send to Dr Kelton Pillar as Juluis Rainier.  Nothing further needed.

## 2018-09-12 ENCOUNTER — Other Ambulatory Visit: Payer: Self-pay | Admitting: Family Medicine

## 2018-09-12 DIAGNOSIS — R42 Dizziness and giddiness: Secondary | ICD-10-CM

## 2018-09-16 ENCOUNTER — Telehealth: Payer: Self-pay | Admitting: Internal Medicine

## 2018-09-16 NOTE — Telephone Encounter (Signed)
Maudie Mercury from [facility] ph# [phone] called to follow up on forms that have been faxed to our office. Please call Kim at the above ph# to advise.

## 2018-09-17 ENCOUNTER — Telehealth: Payer: Self-pay | Admitting: Internal Medicine

## 2018-09-17 NOTE — Telephone Encounter (Signed)
Adrian Neal with [employer] ph# [phone], [facility] called: requests patient's lab addendum stating that the labs were drawn fasting  (document was faxed to our office with a cover sheet today). Fax# [phone].

## 2018-09-17 NOTE — Telephone Encounter (Signed)
Spoke to [employer] and she will fax over paperwork that needs to be signed by Dr. Kelton Pillar and I will fax to her requested labs c-peptide and fasting blood sugar

## 2018-09-18 NOTE — Telephone Encounter (Signed)
Faxed received, signed,dated and faxed back

## 2018-09-22 ENCOUNTER — Encounter: Payer: Self-pay | Admitting: Internal Medicine

## 2018-09-22 NOTE — Telephone Encounter (Signed)
Correct fax number is  [phone], please refax

## 2018-09-23 ENCOUNTER — Encounter: Payer: Self-pay | Admitting: Internal Medicine

## 2018-09-23 NOTE — Telephone Encounter (Signed)
Correct fax# was included in original message, was faxed to correct #

## 2018-09-25 DIAGNOSIS — E109 Type 1 diabetes mellitus without complications: Secondary | ICD-10-CM | POA: Diagnosis not present

## 2018-09-28 ENCOUNTER — Ambulatory Visit (INDEPENDENT_AMBULATORY_CARE_PROVIDER_SITE_OTHER): Payer: PPO | Admitting: Neurology

## 2018-09-28 DIAGNOSIS — E1022 Type 1 diabetes mellitus with diabetic chronic kidney disease: Secondary | ICD-10-CM

## 2018-09-28 DIAGNOSIS — G4731 Primary central sleep apnea: Secondary | ICD-10-CM | POA: Diagnosis not present

## 2018-09-28 DIAGNOSIS — I6330 Cerebral infarction due to thrombosis of unspecified cerebral artery: Secondary | ICD-10-CM

## 2018-09-28 DIAGNOSIS — R0683 Snoring: Secondary | ICD-10-CM

## 2018-09-28 DIAGNOSIS — E10319 Type 1 diabetes mellitus with unspecified diabetic retinopathy without macular edema: Secondary | ICD-10-CM

## 2018-09-28 DIAGNOSIS — G4733 Obstructive sleep apnea (adult) (pediatric): Secondary | ICD-10-CM

## 2018-09-28 DIAGNOSIS — N182 Chronic kidney disease, stage 2 (mild): Secondary | ICD-10-CM

## 2018-09-28 DIAGNOSIS — G4719 Other hypersomnia: Secondary | ICD-10-CM

## 2018-10-05 NOTE — Addendum Note (Signed)
Addended by: Larey Seat on: 10/05/2018 05:29 PM   Modules accepted: Orders

## 2018-10-05 NOTE — Procedures (Signed)
PATIENT'S NAME:  Adrian Neal, Adrian Neal. DOB:      09/06/1965      MR#:    [id number]     DATE OF RECORDING: 09/28/2018 REFERRING M.D.:  [name], MD Study Performed:   Baseline Polysomnogram HISTORY:  Adrian Neal. Adrian Neal is a 52 y.o. male patient, seen here on 07-14-2018 in a re-referral from Dr. Ethelene Hal for a new sleep apnea evaluation. According to patient:" Now that I have insurance / Medicare I need to see if I still need CPAP".      I have the pleasure of seeing this Caucasian right-handed, married male patient again after a 3-year 58-month hiatus.  The patient has been seen in late 2016 at the time referred by Dr. Rosalin Hawking, his stroke physician while on the inpatient service at [facility].  He had also history of hypertension, hyperlipidemia, diabetes, and he had presented with a history of loud snoring, hypersomnia and fatigue.  His diagnosis was established and a split-night polysomnography on 27 April 2015; AHI was 34.5/h., REM AHI was at 72/h all sleep in supine position.  He had no periodic limb movements, no prolonged oxygen desaturation, and a stable heart rate in the 80s in normal sinus rhythm.  He was titrated to CPAP between 5 and 12 cmH2O pressure was prescribed but he was unable to obtain the machine as he lost insurance with the beginning of the following calendar year. 9 cmH2O pressure had been his sweet spot in 2012.   Since 2016 he has not been hospitalized with any medical complications, he has carried now a diagnosis of chronic kidney disease stage II, diabetes mellitus diagnosed at age 41- type I, diabetic retinopathy, hypercholesterolemia, hypertension. Wife reports snoring and apnea have gotten worse, now in all positions. He fell asleep in church, snoring loudly !   Family sleep history: he has 4 brothers, none of whom has apnea. Parents - mother does to have OSA, father deceased at age 76 of MI. Mother has HTN, high cholesterol. Twin brother had a stroke, DM 1  DX at age 25 (years of age), had pancreatic and renal transplant.    The patient endorsed the Epworth Sleepiness Scale at 16/24 points.   The patient's weight 193 pounds with a height of 68 (inches), resulting in a BMI of 29.4 kg/m2. The patient's neck circumference measured 18 inches.  CURRENT MEDICATIONS: Norvasc, Lipitor, Coreg, Plavix, Novolog, Prinivil, Antivert, Protonix, Drisdol.   PROCEDURE:  This is a multichannel digital polysomnogram utilizing the Somnostar 11.2 system.  Electrodes and sensors were applied and monitored per AASM Specifications.   EEG, EOG, Chin and Limb EMG, were sampled at 200 Hz.  ECG, Snore and Nasal Pressure, Thermal Airflow, Respiratory Effort, CPAP Flow and Pressure, Oximetry was sampled at 50 Hz. Digital video and audio were recorded.      BASELINE STUDY: Lights Out was at 22:07 and Lights On at 05:02.  Total recording time (TRT) was 415 minutes, with a total sleep time (TST) of 387.5 minutes. The patient's sleep latency was 12 minutes.  REM latency was 138.5 minutes. The sleep efficiency was 93.4 %.     SLEEP ARCHITECTURE: WASO (Wake after sleep onset) was 21 minutes.  There were 5.5 minutes in Stage N1, 322 minutes Stage N2, 0 minutes Stage N3 and 60 minutes in Stage REM.  The percentage of Stage N1 was 1.4%, Stage N2 was 83.1%, Stage N3 was 0% and Stage R (REM sleep) was 15.5%.   RESPIRATORY ANALYSIS:  There were a total of 220 respiratory events: 102 obstructive apneas, 85 central apneas and 0 mixed apneas with a total of 187 apneas and an apnea index (AI) of 29.0 /hour. There were 33 hypopneas with a hypopnea index of 5.1 /hour.    The total APNEA/HYPOPNEA INDEX (AHI) was 34.1 /hour.  66 events occurred in REM sleep and 111 events in NREM. The REM AHI was 66 /hour, versus a non-REM AHI of 28.2/h. The patient spent 181.5 minutes of total sleep time in the supine position and 206 minutes in non-supine. The supine AHI was 46.0 versus a non-supine AHI of  23.6/h.  OXYGEN SATURATION & C02:  The Wake baseline 02 saturation was 94%, with the lowest being 85%. Time spent below 89% saturation equaled 3 minutes. The arousals were noted as: 114 were spontaneous, 0 were associated with PLMs, and 54 were associated with respiratory events. The patient had a total of 0 Periodic Limb Movements.   Audio and video analysis did not show any abnormal or unusual movements, behaviors, phonations or vocalizations.  The number of central apneas increased in the second half of the sleep study but did not surpass the number of obstructive events.  The patient took one bathroom break. Snoring was noted. EKG was in keeping with normal sinus rhythm (NSR). Post-study, the patient indicated that sleep was the same as usual.   IMPRESSION: 1. Complex, mostly Obstructive Sleep Apnea (OSA)- high severity at AHI of 34.1/h. REM AHI was 66/h. 2. No evidence of Periodic Limb Movement Disorder (PLMD). 3. Primary Snoring was recorded.  4. No prolonged sleep related hypoxia was noted.   RECOMMENDATIONS: 1. Advise full-night, attended, CPAP titration study to optimize therapy.  Given the complex nature of the patient's apnea. The strong REM sleep accentuation indicates that this form of apnea is not responsive to a dental device, either.   I certify that I have reviewed the entire raw data recording prior to the issuance of this report in accordance with the Standards of Accreditation of the American Academy of Sleep Medicine (AASM)  Larey Seat, MD     10-05-2018  Diplomat, American Board of Psychiatry and Neurology  Diplomat, American Board of [place] Director, [employer] [facility] [employer]

## 2018-10-06 ENCOUNTER — Telehealth: Payer: Self-pay | Admitting: Internal Medicine

## 2018-10-06 ENCOUNTER — Encounter: Payer: Self-pay | Admitting: Internal Medicine

## 2018-10-06 DIAGNOSIS — E109 Type 1 diabetes mellitus without complications: Secondary | ICD-10-CM | POA: Diagnosis not present

## 2018-10-06 NOTE — Telephone Encounter (Signed)
Ellison Hughs from [employer] ph# [phone] on behalf of Gean Birchwood called to request clarification re: Humalog RX-Pharmacy needs to know the device type-pens or syringes. Please call the above ph# to clarify or fax info to [facility] at fax# [phone].

## 2018-10-07 ENCOUNTER — Other Ambulatory Visit: Payer: Self-pay

## 2018-10-07 MED ORDER — INSULIN LISPRO 100 UNIT/ML ~~LOC~~ SOLN: SUBCUTANEOUS | 11 refills | Status: DC

## 2018-10-07 NOTE — Telephone Encounter (Signed)
rx faxed

## 2018-10-08 ENCOUNTER — Telehealth: Payer: Self-pay | Admitting: Neurology

## 2018-10-08 ENCOUNTER — Encounter: Payer: Self-pay | Admitting: Neurology

## 2018-10-08 DIAGNOSIS — G4733 Obstructive sleep apnea (adult) (pediatric): Secondary | ICD-10-CM

## 2018-10-08 NOTE — Telephone Encounter (Signed)
-----   Message from Larey Seat, MD sent at 10/05/2018  5:29 PM EDT ----- IMPRESSION: 1. Complex, mostly Obstructive Sleep Apnea (OSA)- high severity  at AHI of 34.1/h. REM AHI was 66/h. 2. No evidence of Periodic Limb Movement Disorder (PLMD). 3. Primary Snoring was recorded.  4. No prolonged sleep related hypoxia was noted.   RECOMMENDATIONS: 1. Advise full-night, attended, CPAP titration study to optimize  therapy. Given the complex nature of the patient's apnea. The  strong REM sleep accentuation indicates that this form of apnea  is not responsive to a dental device, either.

## 2018-10-08 NOTE — Telephone Encounter (Signed)
I called pt. I advised pt that Dr. Brett Fairy reviewed their sleep study results and found that pt has complex OSA. Dr. Brett Fairy recommends that pt starts auto CPAP. I reviewed PAP compliance expectations with the pt. Pt is agreeable to starting a CPAP. I advised pt that an order will be sent to a DME, [facility], and [facility] will call the pt within about one week after they file with the pt's insurance. [facility] will show the pt how to use the machine, fit for masks, and troubleshoot the CPAP if needed. A follow up appt was made for insurance purposes with Ward Givens on 12/03/18 at 11am. Pt verbalized understanding to arrive 15 minutes early and bring their CPAP. A letter with all of this information in it will be mailed to the pt as a reminder. I verified with the pt that the address we have on file is correct. Pt verbalized understanding of results. Pt had no questions at this time but was encouraged to call back if questions arise. I have sent the order to [facility] and have received confirmation that they have received the order.

## 2018-10-08 NOTE — Addendum Note (Signed)
Addended by: Darleen Crocker on: 10/08/2018 10:40 AM   Modules accepted: Orders

## 2018-10-08 NOTE — Telephone Encounter (Signed)
Called patient to discuss sleep study results. No answer at this time. LVM for the patient to call back.   

## 2018-10-09 ENCOUNTER — Other Ambulatory Visit: Payer: Self-pay

## 2018-10-09 MED ORDER — INSULIN LISPRO 100 UNIT/ML ~~LOC~~ SOLN: SUBCUTANEOUS | 11 refills | Status: DC

## 2018-10-17 ENCOUNTER — Other Ambulatory Visit: Payer: Self-pay | Admitting: Family Medicine

## 2018-10-17 DIAGNOSIS — R42 Dizziness and giddiness: Secondary | ICD-10-CM

## 2018-10-19 DIAGNOSIS — G4733 Obstructive sleep apnea (adult) (pediatric): Secondary | ICD-10-CM | POA: Diagnosis not present

## 2018-10-20 ENCOUNTER — Telehealth: Payer: Self-pay | Admitting: Nutrition

## 2018-10-20 NOTE — Telephone Encounter (Signed)
Message left on my machine that he wants to get the T-Slim insulin pump, and he will pay for this out of pocket.  Paperwork requested from [place] and fill out and put on Dr. Quin Hoop desk

## 2018-10-26 ENCOUNTER — Telehealth: Payer: Self-pay | Admitting: Behavioral Health

## 2018-10-26 NOTE — Telephone Encounter (Signed)

## 2018-10-27 ENCOUNTER — Encounter: Payer: Self-pay | Admitting: Family Medicine

## 2018-10-27 ENCOUNTER — Ambulatory Visit (INDEPENDENT_AMBULATORY_CARE_PROVIDER_SITE_OTHER): Payer: PPO | Admitting: Family Medicine

## 2018-10-27 VITALS — BP 138/70 | HR 89 | Ht 68.0 in | Wt 198.5 lb

## 2018-10-27 DIAGNOSIS — E559 Vitamin D deficiency, unspecified: Secondary | ICD-10-CM | POA: Diagnosis not present

## 2018-10-27 DIAGNOSIS — E782 Mixed hyperlipidemia: Secondary | ICD-10-CM

## 2018-10-27 DIAGNOSIS — I10 Essential (primary) hypertension: Secondary | ICD-10-CM | POA: Diagnosis not present

## 2018-10-27 DIAGNOSIS — R42 Dizziness and giddiness: Secondary | ICD-10-CM

## 2018-10-27 DIAGNOSIS — Z8673 Personal history of transient ischemic attack (TIA), and cerebral infarction without residual deficits: Secondary | ICD-10-CM

## 2018-10-27 MED ORDER — CARVEDILOL 25 MG PO TABS: 25.0000 mg | ORAL_TABLET | Freq: Two times a day (BID) | ORAL | 2 refills | Status: DC

## 2018-10-27 MED ORDER — LISINOPRIL 40 MG PO TABS: 40.0000 mg | ORAL_TABLET | Freq: Every day | ORAL | 1 refills | Status: DC

## 2018-10-27 MED ORDER — CLOPIDOGREL BISULFATE 75 MG PO TABS: 75.0000 mg | ORAL_TABLET | Freq: Every day | ORAL | 1 refills | Status: DC

## 2018-10-27 MED ORDER — ATORVASTATIN CALCIUM 40 MG PO TABS: 40.0000 mg | ORAL_TABLET | Freq: Every day | ORAL | 0 refills | Status: DC

## 2018-10-27 MED ORDER — AMLODIPINE BESYLATE 10 MG PO TABS: 10.0000 mg | ORAL_TABLET | Freq: Every day | ORAL | 0 refills | Status: DC

## 2018-10-27 NOTE — Patient Instructions (Signed)
How to Take Your Blood Pressure Blood pressure is a measurement of how strongly your blood is pressing against the walls of your arteries. Arteries are blood vessels that carry blood from your heart throughout your body. Your health care provider takes your blood pressure at each office visit. You can also take your own blood pressure at home with a blood pressure machine. You may need to take your own blood pressure:  To confirm a diagnosis of high blood pressure (hypertension).  To monitor your blood pressure over time.  To make sure your blood pressure medicine is working. Supplies needed: To take your blood pressure, you will need a blood pressure machine. You can buy a blood pressure machine, or blood pressure monitor, at most drugstores or online. There are several types of home blood pressure monitors. When choosing one, consider the following:  Choose a monitor that has an arm cuff.  Choose a cuff that wraps snugly around your upper arm. You should be able to fit only one finger between your arm and the cuff.  Do not choose a monitor that measures your blood pressure from your wrist or finger. Your health care provider can suggest a reliable monitor that will meet your needs. How to prepare To get the most accurate reading, avoid the following for 30 minutes before you check your blood pressure:  Drinking caffeine.  Drinking alcohol.  Eating.  Smoking.  Exercising. Five minutes before you check your blood pressure:  Empty your bladder.  Sit quietly without talking in a dining chair, rather than in a soft couch or armchair. How to take your blood pressure To check your blood pressure, follow the instructions in the manual that came with your blood pressure monitor. If you have a digital blood pressure monitor, the instructions may be as follows: 1. Sit up straight. 2. Place your feet on the floor. Do not cross your ankles or legs. 3. Rest your left arm at the level of  your heart on a table or desk or on the arm of a chair. 4. Pull up your shirt sleeve. 5. Wrap the blood pressure cuff around the upper part of your left arm, 1 inch (2.5 cm) above your elbow. It is best to wrap the cuff around bare skin. 6. Fit the cuff snugly around your arm. You should be able to place only one finger between the cuff and your arm. 7. Position the cord inside the groove of your elbow. 8. Press the power button. 9. Sit quietly while the cuff inflates and deflates. 10. Read the digital reading on the monitor screen and write it down (record it). 11. Wait 2-3 minutes, then repeat the steps, starting at step 1. What does my blood pressure reading mean? A blood pressure reading consists of a higher number over a lower number. Ideally, your blood pressure should be below 120/80. The first ("top") number is called the systolic pressure. It is a measure of the pressure in your arteries as your heart beats. The second ("bottom") number is called the diastolic pressure. It is a measure of the pressure in your arteries as the heart relaxes. Blood pressure is classified into four stages. The following are the stages for adults who do not have a short-term serious illness or a chronic condition. Systolic pressure and diastolic pressure are measured in a unit called mm Hg. Normal  Systolic pressure: below 010.  Diastolic pressure: below 80. Elevated  Systolic pressure: 272-536.  Diastolic pressure: below 80. Hypertension stage  1  Systolic pressure: 150-569.  Diastolic pressure: 79-48. Hypertension stage 2  Systolic pressure: 016 or above.  Diastolic pressure: 90 or above. You can have prehypertension or hypertension even if only the systolic or only the diastolic number in your reading is higher than normal. Follow these instructions at home:  Check your blood pressure as often as recommended by your health care provider.  Take your monitor to the next appointment with your  health care provider to make sure: ? That you are using it correctly. ? That it provides accurate readings.  Be sure you understand what your goal blood pressure numbers are.  Tell your health care provider if you are having any side effects from blood pressure medicine. Contact a health care provider if:  Your blood pressure is consistently high. Get help right away if:  Your systolic blood pressure is higher than 180.  Your diastolic blood pressure is higher than 110. This information is not intended to replace advice given to you by your health care provider. Make sure you discuss any questions you have with your health care provider. Document Released: 09/22/2015 Document Revised: 03/28/2017 Document Reviewed: 09/22/2015 Elsevier Patient Education  2020 [employer].

## 2018-10-27 NOTE — Progress Notes (Addendum)
Established Patient Office Visit  Subjective:  Patient ID: Adrian Neal, male    DOB: Feb 07, 1966  Age: 53 y.o. MRN: [id number]  CC:  Chief Complaint  Patient presents with  . Follow-up    HPI Adrian Neal presents for follow-up of his hypertension and elevated cholesterol.  Patient reports that his blood pressure has been running higher at home.  He is seeing systolic pressures as high as 975 with a diastolic pressures running in the 80-90 range.  Denies headaches or visual issues.  Status post recent eye check fortunately failed to show any evidence for diabetic or hypertensive retinopathy.  He has new CPAP machine and has been using it for a week.  Wife says his snoring is gone.  He is exercising.  He is disabled from the [place].  He has been advised to take Plavix for the duration secondary to multiple strokes.  He has been taking meclizine on a daily basis.  He rarely experiences vertigo.  [name].   Past Medical History:  Diagnosis Date  . Cataract 1994   right eye  . Chronic kidney disease   . Diabetes mellitus without complication ([place])    diagnosed at age 74  . Eye problems   . Heart murmur 1996  . High cholesterol    patient denies but take preventative medicine  . Hypertension   . 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 Sleep apnea    currently not one but looking to get one   . Stroke [facility]) 2016    Past Surgical History:  Procedure Laterality Date  . CATARACT EXTRACTION Right 1994  . CATARACT EXTRACTION W/ INTRAOCULAR LENS IMPLANT  1994  . EYE SURGERY Right 1991   vitrectomy  . EYE SURGERY Right 1992   scar tissue removed from retina    Family History  Problem Relation Age of Onset  . Hypertension Mother   . Hyperlipidemia Mother   . Hyperlipidemia Father   . Hypertension Father   . Diabetes Father   .  Kidney disease Father        had a kidney transplant   . Stroke Brother   . Diabetes Brother   . Pulmonary fibrosis Paternal Grandmother   . Lung cancer Paternal Grandfather   . Diabetes Daughter   . Colon cancer Neg Hx   . Esophageal cancer Neg Hx   . Rectal cancer Neg Hx   . Stomach cancer Neg Hx     Social History   Socioeconomic History  . Marital status: Married    Spouse name: Not on file  . Number of children: 3  . Years of education: Not on file  . Highest education level: Not on file  Occupational History  . Occupation: Disabled  Social Needs  . Financial resource strain: Not on file  . Food insecurity    Worry: Not on file    Inability: Not on file  . Transportation needs    Medical: Not on file    Non-medical: Not on file  Tobacco Use  . Smoking status: Former Smoker    Packs/day: 1.00    Years: 6.00    Pack years: 6.00    Quit date: 04/30/1991    Years since quitting: 27.5  . Smokeless tobacco: Never Used  Substance and Sexual Activity  . Alcohol use: No  .  Drug use: Never  . Sexual activity: Not on file  Lifestyle  . Physical activity    Days per week: Not on file    Minutes per session: Not on file  . Stress: Not on file  Relationships  . Social Herbalist on phone: Not on file    Gets together: Not on file    Attends religious service: Not on file    Active member of club or organization: Not on file    Attends meetings of clubs or organizations: Not on file    Relationship status: Not on file  . Intimate partner violence    Fear of current or ex partner: Not on file    Emotionally abused: Not on file    Physically abused: Not on file    Forced sexual activity: Not on file  Other Topics Concern  . Not on file  Social History Narrative  . Not on file    Outpatient Medications Prior to Visit  Medication Sig Dispense Refill  . Continuous Blood Gluc Receiver (DEXCOM G6 RECEIVER) DEVI 1 Device by Does not apply route as directed.  1 Device 0  . Continuous Blood Gluc Sensor (DEXCOM G6 SENSOR) MISC 3 Devices by Does not apply route as directed. 3 each 6  . Continuous Blood Gluc Transmit (DEXCOM G6 TRANSMITTER) MISC 1 Device by Does not apply route as directed. 1 each 6  . Insulin Human (INSULIN PUMP) SOLN Inject 0-2 each into the skin every 4 (four) hours.    . Insulin Infusion Pump Supplies (MINIMED PRO-SET INFUSION 24") MISC 1 Device by Does not apply route every 3 (three) days. 30 each 3  . Insulin Infusion Pump Supplies (MINIMED PRO-SET INFUSION 24") MISC 1 Device by Does not apply route every 3 (three) days. 30 each 3  . Insulin Infusion Pump Supplies (MINIMED RESERVOIR 3ML) MISC 1 Device by Does not apply route every 3 (three) days. 30 each 3  . insulin lispro (HUMALOG) 100 UNIT/ML injection Max daily dose of 100 units via pump DX E10.59 (vials) 30 mL 11  . meclizine (ANTIVERT) 25 MG tablet TAKE 1 TABLET BY MOUTH THREE TIMES DAILY AS NEEDED FOR DIZZINESS 30 tablet 0  . pantoprazole (PROTONIX) 40 MG tablet Take 1 tablet (40 mg total) by mouth daily. 90 tablet 1  . amLODipine (NORVASC) 10 MG tablet Take 1 tablet (10 mg total) by mouth daily. 90 tablet 0  . atorvastatin (LIPITOR) 40 MG tablet Take 1 tablet (40 mg total) by mouth daily at 6 PM. 90 tablet 0  . carvedilol (COREG) 25 MG tablet Take 1 tablet (25 mg total) by mouth 2 (two) times daily with a meal. 60 tablet 2  . clopidogrel (PLAVIX) 75 MG tablet Take 1 tablet (75 mg total) by mouth daily. Aspirin and Plavix for 3 months, after that-stop Aspirin-take Plavix alone 90 tablet 1  . lisinopril (PRINIVIL,ZESTRIL) 40 MG tablet Take 1 tablet (40 mg total) by mouth daily. 90 tablet 1  . Vitamin D, Ergocalciferol, (DRISDOL) 1.25 MG (50000 UT) CAPS capsule Take 1 capsule (50,000 Units total) by mouth every 7 (seven) days. 5 capsule 5   No facility-administered medications prior to visit.     No Known Allergies  ROS Review of Systems  Constitutional: Negative.   HENT:  Negative.   Respiratory: Negative.   Cardiovascular: Negative.   Gastrointestinal: Negative.   Endocrine: Negative for polyphagia and polyuria.  Genitourinary: Negative for difficulty urinating, flank pain and urgency.  Musculoskeletal: Negative for gait problem and joint swelling.  Skin: Negative for pallor and rash.  Allergic/Immunologic: Negative for immunocompromised state.  Neurological: Negative for dizziness and headaches.  Hematological: Negative for adenopathy.  Psychiatric/Behavioral: Negative.       Objective:    Physical Exam  BP 138/70   Pulse 89   Ht _0  (1.727 m)   Wt 198 lb 8 oz (90 kg)   SpO2 95%   BMI 30.18 kg/m  Wt Readings from Last 3 Encounters:  10/27/18 198 lb 8 oz (90 kg)  09/09/18 197 lb (89.4 kg)  07/15/18 193 lb (87.5 kg)   BP Readings from Last 3 Encounters:  10/27/18 138/70  09/09/18 (!) 162/78  07/15/18 140/80   Guideline developer:  UpToDate (see UpToDate for funding source) Date Released: June 2014  Health Maintenance Due  Topic Date Due  . OPHTHALMOLOGY EXAM  11/22/1975  . HIV Screening  11/21/1980    There are no preventive care reminders to display for this patient.  Lab Results  Component Value Date   TSH 4.41 06/15/2018   Lab Results  Component Value Date   WBC 4.8 10/28/2018   HGB 13.1 10/28/2018   HCT 39.1 10/28/2018   MCV 86.8 10/28/2018   PLT 120.0 (L) 10/28/2018   Lab Results  Component Value Date   NA 143 10/28/2018   K 4.3 10/28/2018   CO2 28 10/28/2018   GLUCOSE 76 10/28/2018   BUN 23 10/28/2018   CREATININE 1.78 (H) 10/28/2018   BILITOT 0.7 10/28/2018   ALKPHOS 115 10/28/2018   AST 17 10/28/2018   ALT 26 10/28/2018   PROT 6.4 10/28/2018   ALBUMIN 4.0 10/28/2018   CALCIUM 8.9 10/28/2018   ANIONGAP 7 01/19/2015   GFR 40.19 (L) 10/28/2018   Lab Results  Component Value Date   CHOL 107 10/28/2018   Lab Results  Component Value Date   HDL 35.10 (L) 10/28/2018   Lab Results  Component Value  Date   LDLCALC 60 10/28/2018   Lab Results  Component Value Date   TRIG 58.0 10/28/2018   Lab Results  Component Value Date   CHOLHDL 3 10/28/2018   Lab Results  Component Value Date   HGBA1C 8.6 (A) 06/01/2018      Assessment & Plan:   Problem List Items Addressed This Visit      Cardiovascular and Mediastinum   Essential hypertension - Primary   Relevant Medications   amLODipine (NORVASC) 10 MG tablet   atorvastatin (LIPITOR) 40 MG tablet   carvedilol (COREG) 25 MG tablet   lisinopril (ZESTRIL) 40 MG tablet   Other Relevant Orders   Comprehensive metabolic panel (Completed)   Microalbumin / creatinine urine ratio (Completed)   Urinalysis, Routine w reflex microscopic (Completed)   CBC (Completed)     Other   Vertigo   HLD (hyperlipidemia)   Relevant Medications   amLODipine (NORVASC) 10 MG tablet   atorvastatin (LIPITOR) 40 MG tablet   carvedilol (COREG) 25 MG tablet   lisinopril (ZESTRIL) 40 MG tablet   Other Relevant Orders   LDL cholesterol, direct (Completed)   Lipid panel (Completed)   History of CVA (cerebrovascular accident)   Relevant Medications   clopidogrel (PLAVIX) 75 MG tablet   Vitamin D deficiency   Relevant Medications   Vitamin D, Ergocalciferol, (DRISDOL) 1.25 MG (50000 UT) CAPS capsule   Other Relevant Orders   VITAMIN D 25 Hydroxy (Vit-D Deficiency, Fractures) (Completed)    Other Visit Diagnoses  Benign essential HTN       Relevant Medications   amLODipine (NORVASC) 10 MG tablet   atorvastatin (LIPITOR) 40 MG tablet   carvedilol (COREG) 25 MG tablet   lisinopril (ZESTRIL) 40 MG tablet   Other Relevant Orders   Comprehensive metabolic panel (Completed)      Meds ordered this encounter  Medications  . amLODipine (NORVASC) 10 MG tablet    Sig: Take 1 tablet (10 mg total) by mouth daily.    Dispense:  90 tablet    Refill:  0  . atorvastatin (LIPITOR) 40 MG tablet    Sig: Take 1 tablet (40 mg total) by mouth daily at 6 PM.     Dispense:  90 tablet    Refill:  0  . carvedilol (COREG) 25 MG tablet    Sig: Take 1 tablet (25 mg total) by mouth 2 (two) times daily with a meal.    Dispense:  60 tablet    Refill:  2  . clopidogrel (PLAVIX) 75 MG tablet    Sig: Take 1 tablet (75 mg total) by mouth daily. Aspirin and Plavix for 3 months, after that-stop Aspirin-take Plavix alone    Dispense:  90 tablet    Refill:  1  . lisinopril (ZESTRIL) 40 MG tablet    Sig: Take 1 tablet (40 mg total) by mouth daily.    Dispense:  90 tablet    Refill:  1  . Vitamin D, Ergocalciferol, (DRISDOL) 1.25 MG (50000 UT) CAPS capsule    Sig: Take 1 capsule (50,000 Units total) by mouth every 7 (seven) days.    Dispense:  5 capsule    Refill:  5    Follow-up: Return in about 3 months (around 01/27/2019), or return with blood pressure cuff to compare with our wall unit. return fasting for blood work..   Believe that his blood pressure cuff may be off.  He will bring his cuff into the clinic to compare with our wall unit.  Advised him to only use the meclizine as needed.  Continue all other medicines as above.

## 2018-10-28 ENCOUNTER — Other Ambulatory Visit (INDEPENDENT_AMBULATORY_CARE_PROVIDER_SITE_OTHER): Payer: PPO

## 2018-10-28 DIAGNOSIS — E559 Vitamin D deficiency, unspecified: Secondary | ICD-10-CM | POA: Diagnosis not present

## 2018-10-28 DIAGNOSIS — I10 Essential (primary) hypertension: Secondary | ICD-10-CM

## 2018-10-28 DIAGNOSIS — E782 Mixed hyperlipidemia: Secondary | ICD-10-CM | POA: Diagnosis not present

## 2018-10-28 LAB — CBC
HCT: 39.1 % (ref 39.0–52.0)
Hemoglobin: 13.1 g/dL (ref 13.0–17.0)
MCHC: 33.5 g/dL (ref 30.0–36.0)
MCV: 86.8 fl (ref 78.0–100.0)
Platelets: 120 10*3/uL — ABNORMAL LOW (ref 150.0–400.0)
RBC: 4.51 Mil/uL (ref 4.22–5.81)
RDW: 13.2 % (ref 11.5–15.5)
WBC: 4.8 10*3/uL (ref 4.0–10.5)

## 2018-10-28 LAB — LIPID PANEL
Cholesterol: 107 mg/dL (ref 0–200)
HDL: 35.1 mg/dL — ABNORMAL LOW (ref >39.00)
LDL Cholesterol: 60 mg/dL (ref 0–99)
NonHDL: 72.08
Total CHOL/HDL Ratio: 3
Triglycerides: 58 mg/dL (ref 0.0–149.0)
VLDL: 11.6 mg/dL (ref 0.0–40.0)

## 2018-10-28 LAB — COMPREHENSIVE METABOLIC PANEL
ALT: 26 U/L (ref 0–53)
AST: 17 U/L (ref 0–37)
Albumin: 4 g/dL (ref 3.5–5.2)
Alkaline Phosphatase: 115 U/L (ref 39–117)
BUN: 23 mg/dL (ref 6–23)
CO2: 28 mEq/L (ref 19–32)
Calcium: 8.9 mg/dL (ref 8.4–10.5)
Chloride: 109 mEq/L (ref 96–112)
Creatinine, Ser: 1.78 mg/dL — ABNORMAL HIGH (ref 0.40–1.50)
GFR: 40.19 mL/min — ABNORMAL LOW (ref >60.00)
Glucose, Bld: 76 mg/dL (ref 70–99)
Potassium: 4.3 mEq/L (ref 3.5–5.1)
Sodium: 143 mEq/L (ref 135–145)
Total Bilirubin: 0.7 mg/dL (ref 0.2–1.2)
Total Protein: 6.4 g/dL (ref 6.0–8.3)

## 2018-10-28 LAB — URINALYSIS, ROUTINE W REFLEX MICROSCOPIC
Bilirubin Urine: NEGATIVE
Hgb urine dipstick: NEGATIVE
Ketones, ur: NEGATIVE
Leukocytes,Ua: NEGATIVE
Nitrite: NEGATIVE
RBC / HPF: NONE SEEN (ref 0–?)
Specific Gravity, Urine: 1.015 (ref 1.000–1.030)
Total Protein, Urine: 30 — AB
Urine Glucose: NEGATIVE
Urobilinogen, UA: 0.2 (ref 0.0–1.0)
WBC, UA: NONE SEEN (ref 0–?)
pH: 5.5 (ref 5.0–8.0)

## 2018-10-28 LAB — VITAMIN D 25 HYDROXY (VIT D DEFICIENCY, FRACTURES): VITD: 29.74 ng/mL — ABNORMAL LOW (ref 30.00–100.00)

## 2018-10-28 LAB — MICROALBUMIN / CREATININE URINE RATIO
Creatinine,U: 53.2 mg/dL
Microalb Creat Ratio: 53.4 mg/g — ABNORMAL HIGH (ref 0.0–30.0)
Microalb, Ur: 28.4 mg/dL — ABNORMAL HIGH (ref 0.0–1.9)

## 2018-10-28 LAB — LDL CHOLESTEROL, DIRECT: Direct LDL: 63 mg/dL

## 2018-10-29 MED ORDER — VITAMIN D (ERGOCALCIFEROL) 1.25 MG (50000 UNIT) PO CAPS: 50000.0000 [IU] | ORAL_CAPSULE | ORAL | 5 refills | Status: DC

## 2018-10-29 NOTE — Addendum Note (Signed)
Addended by: Jon Billings on: 10/29/2018 09:29 AM   Modules accepted: Orders

## 2018-11-05 ENCOUNTER — Telehealth: Payer: Self-pay | Admitting: Internal Medicine

## 2018-11-05 DIAGNOSIS — E109 Type 1 diabetes mellitus without complications: Secondary | ICD-10-CM | POA: Diagnosis not present

## 2018-11-05 NOTE — Telephone Encounter (Signed)
MEDICATION: insulin lispro (HUMALOG) 100 UNIT/ML injection  PHARMACY:  [facility] : YES  IS PATIENT OUT OF MEDICATION:   IF NOT; HOW MUCH IS LEFT:   LAST APPOINTMENT DATE: @6 /12/2018  NEXT APPOINTMENT DATE:@Visit  date not found  DO WE HAVE YOUR PERMISSION TO LEAVE A DETAILED MESSAGE:  OTHER COMMENTS:  Please message patient of MyChart when this is done  **Let patient know to contact pharmacy at the end of the day to make sure medication is ready. **  ** Please notify patient to allow 48-72 hours to process**  **Encourage patient to contact the pharmacy for refills or they can request refills through [facility]**

## 2018-11-06 ENCOUNTER — Other Ambulatory Visit: Payer: Self-pay

## 2018-11-06 MED ORDER — INSULIN LISPRO 100 UNIT/ML ~~LOC~~ SOLN: SUBCUTANEOUS | 11 refills | Status: DC

## 2018-11-09 ENCOUNTER — Encounter: Payer: Self-pay | Admitting: Internal Medicine

## 2018-11-10 ENCOUNTER — Other Ambulatory Visit: Payer: Self-pay | Admitting: Internal Medicine

## 2018-11-10 MED ORDER — INSULIN LISPRO 100 UNIT/ML ~~LOC~~ SOLN: SUBCUTANEOUS | 4 refills | Status: DC

## 2018-11-11 DIAGNOSIS — E109 Type 1 diabetes mellitus without complications: Secondary | ICD-10-CM | POA: Diagnosis not present

## 2018-11-16 ENCOUNTER — Telehealth: Payer: Self-pay | Admitting: Nutrition

## 2018-11-16 NOTE — Telephone Encounter (Signed)
Discussed with patient times that will be available to start him on his Tandem pump/dexcom.  He is getting it today, but it has not come.  He will call me with date/time of when he can be here.

## 2018-11-18 ENCOUNTER — Other Ambulatory Visit: Payer: Self-pay

## 2018-11-18 ENCOUNTER — Encounter: Payer: PPO | Attending: Internal Medicine | Admitting: Nutrition

## 2018-11-18 DIAGNOSIS — G4733 Obstructive sleep apnea (adult) (pediatric): Secondary | ICD-10-CM | POA: Diagnosis not present

## 2018-11-18 DIAGNOSIS — Z794 Long term (current) use of insulin: Secondary | ICD-10-CM | POA: Insufficient documentation

## 2018-11-18 DIAGNOSIS — IMO0001 Reserved for inherently not codable concepts without codable children: Secondary | ICD-10-CM

## 2018-11-18 DIAGNOSIS — E119 Type 2 diabetes mellitus without complications: Secondary | ICD-10-CM | POA: Insufficient documentation

## 2018-11-19 ENCOUNTER — Telehealth: Payer: Self-pay | Admitting: Nutrition

## 2018-11-19 NOTE — Telephone Encounter (Signed)
Phoned patient to see how he was doing on his new Tandem pump.  He reports no difficulties giving boluses, or using the pump.  No alerts or alarms, and he had no questions.  He has not gotten his T-connect set up, because he is having problems with the computer site.  He was told to call the 800 number on his pump, or in his manual for this.He agreed to do this today.

## 2018-11-20 NOTE — Patient Instructions (Signed)
Read over handouts given and call if questions Sign up for T-Connect account with handout given for this. Call me with user name and password

## 2018-11-20 NOTE — Progress Notes (Signed)
Adrian Neal was trained on how to use the Tandem basal IQ.  Settings were transferred from his old pump:  Basal rates: 1.0, I/C: MN: 11, 11AM: 9, ISF: 30,  Target 110, timing: 4 hours.   We reviewed how to bolus, hot to pump the pump in stop, how to fill/change and infusion set, and how to use the extended bolus.  Handouts were given for all of these.  He filled a cartridge and attach an infusion set without difficulty.  We linked his Dexcom to the Tandem and it was successful.  He signed off the checklist as understanding all topics.   I strongly encouraged him to set up a T-Connect account and we reviewed how to do this.  He has a handout for this as well.  He was also shown how to download his pump when he gets this set up.  He agreed to do this and to call me with his user name and password. He had no final questions.

## 2018-11-30 ENCOUNTER — Telehealth: Payer: Self-pay | Admitting: Nutrition

## 2018-11-30 NOTE — Telephone Encounter (Signed)
Message left on machine to see how he is doing on his new pump, and to see if he has set up a Tconnect account.  Asked that he call me so that I can download pump here and show to MD.

## 2018-12-03 ENCOUNTER — Other Ambulatory Visit: Payer: Self-pay

## 2018-12-03 ENCOUNTER — Encounter: Payer: Self-pay | Admitting: Adult Health

## 2018-12-03 ENCOUNTER — Ambulatory Visit: Payer: PPO | Admitting: Adult Health

## 2018-12-03 VITALS — BP 154/76 | HR 75 | Temp 98.7°F | Ht 68.0 in | Wt 199.6 lb

## 2018-12-03 DIAGNOSIS — Z9989 Dependence on other enabling machines and devices: Secondary | ICD-10-CM

## 2018-12-03 DIAGNOSIS — G4733 Obstructive sleep apnea (adult) (pediatric): Secondary | ICD-10-CM

## 2018-12-03 NOTE — Progress Notes (Signed)
PATIENT: Adrian Neal DOB: [birth year]  REASON FOR VISIT: follow up HISTORY FROM: patient  HISTORY OF PRESENT ILLNESS: Today 12/03/18:  Adrian Neal is a 53 year old male with a history of obstructive sleep apnea on CPAP.  His CPAP download indicates that he uses machine 30 out of 30 days for compliance of 100%.  He uses machine greater than 4 hours each night.  On average he uses his machine 8 hours and 48 minutes.  His residual AHI is 11.1 on 5 to 15 cm of water with EPR of 3.  His leak in the 95th percentile is 11.1 L/min.  His average pressure is 12.1.  He reports that some nights he does feel the mask leaking especially when he lays on his right side.  He reports that he can see the benefit however is been minimal.  He returns today for evaluation.  HISTORY Adrian Neal is a 53 y.o. male patient, seen here on 07-14-2018 in a re-referral from Dr. Ethelene Hal for a new sleep apnea evaluation.  Chief complaint according to patient : Now that I have insurance on medicare and want to see if I still need CPAP.     I have the pleasure of seeing this Caucasian right-handed married male patient again after a 3-year 61-month hiatus.  The patient has been seen in late 2016 at the time referred by Dr. Pennie Banter his stroke physician while he was on the inpatient service at [facility].  He had also history of hypertension hyperlipidemia diabetes and he had presented with a history of loud snoring, hypersomnia and fatigue.  His diagnosis was established and a split-night polysomnography on 27 April 2015 AHI was 34.5, REM AHI was at 72/h all sleep in supine position.  He had no periodic limb movements, no prolonged oxygen desaturation, and a stable heart rate in the 80s in normal sinus rhythm.  He was titrated to CPAP between 5 and 12 cmH2O pressure was prescribed but he was unable to obtain the machine as he lost insurance with the beginning of the following calendar year.  9 cmH2O  have been his sweet spot in 2012.    Since 2016 he has not been hospitalized with any medical complications, he has carried now a diagnosis of chronic kidney disease stage II, diabetes mellitus diagnosed at age 58 type I, diabetic retinopathy, hypercholesterolemia, hypertension, and a stroke on 01-26-2015 but presented to hospital with CVA in October 2016.  Wife reports snoring and apnea have gotten worse, now in all positions. He fell asleep in church, snoring loudly !   Sleep habits are as follows: 5 Pm, and bedtime is at 9 PM. He has no difficulties falling asleep. He sleeps on his sides, using one pillow. The bedroom is cool, quiet and dark. He uses an Arboriculturist. One nocturia. Returns to sleep, dreaming some nights. Wakes by alarm by 6.05 AM - brings his 50 year old son to school.    Sleep medical history: see above - stroke, DM1 , HTN, diabetic nephropathy , retinopathy, vasculopathy.   Family sleep history: he has 4 brothers , none has apnea. Parents - mother does to have OSA, father deceased at age 73 of MI. Mother has HTN, high cholesterol. Twin brother had a stroke, DM 1 DX at age 56 (years of age),  who had pancreatic and renal transplant.    Social history: married,  1 son who is 39 years old. non smoker, non drinker, caffeine use -  diet sodas with meals, 4 a day . 1 cup of coffee on Sunday.  REVIEW OF SYSTEMS: Out of a complete 14 system review of symptoms, the patient complains only of the following symptoms, and all other reviewed systems are negative.  ESS 12, fatigue severity score 26  ALLERGIES: No Known Allergies  HOME MEDICATIONS: Outpatient Medications Prior to Visit  Medication Sig Dispense Refill  . amLODipine (NORVASC) 10 MG tablet Take 1 tablet (10 mg total) by mouth daily. 90 tablet 0  . atorvastatin (LIPITOR) 40 MG tablet Take 1 tablet (40 mg total) by mouth daily at 6 PM. 90 tablet 0  . carvedilol (COREG) 25 MG tablet Take 1 tablet (25 mg total) by  mouth 2 (two) times daily with a meal. 60 tablet 2  . clopidogrel (PLAVIX) 75 MG tablet Take 1 tablet (75 mg total) by mouth daily. Aspirin and Plavix for 3 months, after that-stop Aspirin-take Plavix alone 90 tablet 1  . Continuous Blood Gluc Receiver (DEXCOM G6 RECEIVER) DEVI 1 Device by Does not apply route as directed. 1 Device 0  . Continuous Blood Gluc Sensor (DEXCOM G6 SENSOR) MISC 3 Devices by Does not apply route as directed. 3 each 6  . Continuous Blood Gluc Transmit (DEXCOM G6 TRANSMITTER) MISC 1 Device by Does not apply route as directed. 1 each 6  . Insulin Human (INSULIN PUMP) SOLN Inject 0-2 each into the skin every 4 (four) hours.    . Insulin Infusion Pump Supplies (MINIMED PRO-SET INFUSION 24") MISC 1 Device by Does not apply route every 3 (three) days. 30 each 3  . Insulin Infusion Pump Supplies (MINIMED PRO-SET INFUSION 24") MISC 1 Device by Does not apply route every 3 (three) days. 30 each 3  . Insulin Infusion Pump Supplies (MINIMED RESERVOIR 3ML) MISC 1 Device by Does not apply route every 3 (three) days. 30 each 3  . insulin lispro (HUMALOG) 100 UNIT/ML injection Max daily dose of 100 units via pump DX E10.59 (vials) 90 mL 4  . lisinopril (ZESTRIL) 40 MG tablet Take 1 tablet (40 mg total) by mouth daily. 90 tablet 1  . meclizine (ANTIVERT) 25 MG tablet TAKE 1 TABLET BY MOUTH THREE TIMES DAILY AS NEEDED FOR DIZZINESS 30 tablet 0  . pantoprazole (PROTONIX) 40 MG tablet Take 1 tablet (40 mg total) by mouth daily. 90 tablet 1  . Vitamin D, Ergocalciferol, (DRISDOL) 1.25 MG (50000 UT) CAPS capsule Take 1 capsule (50,000 Units total) by mouth every 7 (seven) days. 5 capsule 5   No facility-administered medications prior to visit.     PAST MEDICAL HISTORY: Past Medical History:  Diagnosis Date  . Cataract 1994   right eye  . Chronic kidney disease   . Diabetes mellitus without complication ([place])    diagnosed at age 1  . Eye problems   . Heart murmur 1996  . High  cholesterol    patient denies but take preventative medicine  . Hypertension   . Retinopathy due to secondary diabetes mellitus ([place])    right  . Sleep apnea    currently not one but looking to get one   . Stroke [facility]) 2016    PAST SURGICAL HISTORY: Past Surgical History:  Procedure Laterality Date  . CATARACT EXTRACTION Right 1994  . CATARACT EXTRACTION W/ INTRAOCULAR LENS IMPLANT  1994  . EYE SURGERY Right 1991   vitrectomy  . EYE SURGERY Right 1992   scar tissue removed from retina    FAMILY HISTORY: Family  History  Problem Relation Age of Onset  . Hypertension Mother   . Hyperlipidemia Mother   . Hyperlipidemia Father   . Hypertension Father   . Diabetes Father   . Kidney disease Father        had a kidney transplant   . Stroke Brother   . Diabetes Brother   . Pulmonary fibrosis Paternal Grandmother   . Lung cancer Paternal Grandfather   . Diabetes Daughter   . Colon cancer Neg Hx   . Esophageal cancer Neg Hx   . Rectal cancer Neg Hx   . Stomach cancer Neg Hx     SOCIAL HISTORY: Social History   Socioeconomic History  . Marital status: Married    Spouse name: Not on file  . Number of children: 3  . Years of education: Not on file  . Highest education level: Not on file  Occupational History  . Occupation: Disabled  Social Needs  . Financial resource strain: Not on file  . Food insecurity    Worry: Not on file    Inability: Not on file  . Transportation needs    Medical: Not on file    Non-medical: Not on file  Tobacco Use  . Smoking status: Former Smoker    Packs/day: 1.00    Years: 6.00    Pack years: 6.00    Quit date: 04/30/1991    Years since quitting: 27.6  . Smokeless tobacco: Never Used  Substance and Sexual Activity  . Alcohol use: No  . Drug use: Never  . Sexual activity: Not on file  Lifestyle  . Physical activity    Days per week: Not on file    Minutes per session: Not on file  . Stress: Not on file  Relationships  . Social  Herbalist on phone: Not on file    Gets together: Not on file    Attends religious service: Not on file    Active member of club or organization: Not on file    Attends meetings of clubs or organizations: Not on file    Relationship status: Not on file  . Intimate partner violence    Fear of current or ex partner: Not on file    Emotionally abused: Not on file    Physically abused: Not on file    Forced sexual activity: Not on file  Other Topics Concern  . Not on file  Social History Narrative  . Not on file      PHYSICAL EXAM  Vitals:   12/03/18 1413  BP: (!) 154/76  Pulse: 75  Temp: 98.7 F (37.1 C)  Weight: 199 lb 9.6 oz (90.5 kg)  Height: 5\' 8"  (1.727 m)   Body mass index is 30.35 kg/m.  Generalized: Well developed, in no acute distress  Chest: Lungs clear to auscultation bilaterally  Neurological examination  Mentation: Alert oriented to time, place, history taking. Follows all commands speech and language fluent Cranial nerve II-XII: Extraocular movements were full, visual field were full on confrontational test. Facial sensation and strength were normal. Uvula tongue midline. Head turning and shoulder shrug  were normal and symmetric. Motor: The motor testing reveals 5 over 5 strength of all 4 extremities. Good symmetric motor tone is noted throughout.  Sensory: Sensory testing is intact to soft touch on all 4 extremities. No evidence of extinction is noted.  Coordination: Cerebellar testing reveals good finger-nose-finger and heel-to-shin bilaterally.  Gait and station: Gait is normal.  DIAGNOSTIC DATA (LABS, IMAGING, TESTING) - I reviewed patient records, labs, notes, testing and imaging myself where available.  Lab Results  Component Value Date   WBC 4.8 10/28/2018   HGB 13.1 10/28/2018   HCT 39.1 10/28/2018   MCV 86.8 10/28/2018   PLT 120.0 (L) 10/28/2018      Component Value Date/Time   NA 143 10/28/2018 0839   K 4.3 10/28/2018 0839    CL 109 10/28/2018 0839   CO2 28 10/28/2018 0839   GLUCOSE 76 10/28/2018 0839   BUN 23 10/28/2018 0839   CREATININE 1.78 (H) 10/28/2018 0839   CALCIUM 8.9 10/28/2018 0839   PROT 6.4 10/28/2018 0839   ALBUMIN 4.0 10/28/2018 0839   AST 17 10/28/2018 0839   ALT 26 10/28/2018 0839   ALKPHOS 115 10/28/2018 0839   BILITOT 0.7 10/28/2018 0839   GFRNONAA 49 (L) 01/19/2015 1348   GFRAA 57 (L) 01/19/2015 1348   Lab Results  Component Value Date   CHOL 107 10/28/2018   HDL 35.10 (L) 10/28/2018   LDLCALC 60 10/28/2018   LDLDIRECT 63.0 10/28/2018   TRIG 58.0 10/28/2018   CHOLHDL 3 10/28/2018   Lab Results  Component Value Date   HGBA1C 8.6 (A) 06/01/2018   No results found for: VITAMINB12 Lab Results  Component Value Date   TSH 4.41 06/15/2018      ASSESSMENT AND PLAN 53 y.o. year old male  has a past medical history of Cataract (1994), Chronic kidney disease, Diabetes mellitus without complication ([place]), Eye problems, Heart murmur (1996), High cholesterol, Hypertension, Retinopathy due to secondary diabetes mellitus ([place]), Sleep apnea, and Stroke ([place]) (2016). here with:  1.  Obstructive sleep apnea on CPAP  The patient CPAP download shows good compliance however his AHI is slightly elevated.  I will increase his pressure to 5 to 20 cm of water.  I will get a download in 2 months.  If his residual AHI remains elevated we will consider a CPAP titration for possible transition to BiPAP.  He will follow-up in 6 months or sooner if needed.   I spent 15 minutes with the patient. 50% of this time was spent reviewing CPAP download   Ward Givens, MSN, NP-C 12/03/2018, 2:48 PM [facility] [address] [place], [place] [address] [phone]

## 2018-12-03 NOTE — Patient Instructions (Addendum)
Continue using CPAP nightly and greater than 4 hours each night Increase pressure to 5-20 cmH20 If your symptoms worsen or you develop new symptoms please let us know.

## 2018-12-03 NOTE — Progress Notes (Signed)
cpap orders received by [facility]. 12-03-18 sy

## 2018-12-07 DIAGNOSIS — E109 Type 1 diabetes mellitus without complications: Secondary | ICD-10-CM | POA: Diagnosis not present

## 2018-12-08 ENCOUNTER — Encounter: Payer: PPO | Attending: Internal Medicine | Admitting: Nutrition

## 2018-12-08 ENCOUNTER — Other Ambulatory Visit: Payer: Self-pay

## 2018-12-08 DIAGNOSIS — Z794 Long term (current) use of insulin: Secondary | ICD-10-CM | POA: Insufficient documentation

## 2018-12-08 DIAGNOSIS — IMO0001 Reserved for inherently not codable concepts without codable children: Secondary | ICD-10-CM

## 2018-12-08 DIAGNOSIS — E119 Type 2 diabetes mellitus without complications: Secondary | ICD-10-CM | POA: Insufficient documentation

## 2018-12-08 NOTE — Progress Notes (Signed)
Follow-up training. Auto off alarms was turned off at patient's request. He downloaded the T-connect app, so that his pump will continuously  Be sent to the cloud and he is now linked to our system.  He had no final questions.  He was told to make an appointment with Dr. Kelton Pillar in one week.  He agreed to do this.  He denies difficulty with using the pump, wearing the pump, other alerts and alarms, or the Dexcom system.  I re viewed with him how to upgrade to the control IQ, and what this will do for him.  Written instructions were given for him to go on line and take the test.   His had no final questions.

## 2018-12-09 NOTE — Patient Instructions (Signed)
Read over handouts and checklists.

## 2018-12-12 DIAGNOSIS — E109 Type 1 diabetes mellitus without complications: Secondary | ICD-10-CM | POA: Diagnosis not present

## 2018-12-15 ENCOUNTER — Other Ambulatory Visit: Payer: Self-pay

## 2018-12-15 NOTE — Telephone Encounter (Signed)
Opened in error

## 2018-12-17 ENCOUNTER — Other Ambulatory Visit: Payer: Self-pay

## 2018-12-17 ENCOUNTER — Ambulatory Visit (INDEPENDENT_AMBULATORY_CARE_PROVIDER_SITE_OTHER): Payer: PPO | Admitting: Internal Medicine

## 2018-12-17 ENCOUNTER — Encounter: Payer: Self-pay | Admitting: Internal Medicine

## 2018-12-17 VITALS — BP 124/58 | HR 84 | Temp 97.9°F | Ht 68.0 in | Wt 199.4 lb

## 2018-12-17 DIAGNOSIS — E1022 Type 1 diabetes mellitus with diabetic chronic kidney disease: Secondary | ICD-10-CM | POA: Insufficient documentation

## 2018-12-17 DIAGNOSIS — E109 Type 1 diabetes mellitus without complications: Secondary | ICD-10-CM | POA: Insufficient documentation

## 2018-12-17 DIAGNOSIS — N183 Chronic kidney disease, stage 3 (moderate): Secondary | ICD-10-CM

## 2018-12-17 DIAGNOSIS — E10319 Type 1 diabetes mellitus with unspecified diabetic retinopathy without macular edema: Secondary | ICD-10-CM

## 2018-12-17 DIAGNOSIS — E1059 Type 1 diabetes mellitus with other circulatory complications: Secondary | ICD-10-CM | POA: Diagnosis not present

## 2018-12-17 DIAGNOSIS — N184 Chronic kidney disease, stage 4 (severe): Secondary | ICD-10-CM | POA: Insufficient documentation

## 2018-12-17 DIAGNOSIS — E1040 Type 1 diabetes mellitus with diabetic neuropathy, unspecified: Secondary | ICD-10-CM | POA: Insufficient documentation

## 2018-12-17 NOTE — Patient Instructions (Signed)
New Pump Settings  Basal rate       0000-0000  1.0 u/h       I:C ratio       0000-0000 1:9      Sensitivity       0000  25      Goal       0000  110  Active Insulin 0000 4 hrs           - HOW TO TREAT LOW BLOOD SUGARS (Blood sugar LESS THAN 70 MG/DL)  Please follow the RULE OF 15 for the treatment of hypoglycemia treatment (when your (blood sugars are less than 70 mg/dL)    STEP 1: Take 15 grams of carbohydrates when your blood sugar is low, which includes:   3-4 GLUCOSE TABS  OR  3-4 OZ OF JUICE OR REGULAR SODA OR  ONE TUBE OF GLUCOSE GEL     STEP 2: RECHECK blood sugar in 15 MINUTES STEP 3: If your blood sugar is still low at the 15 minute recheck --> then, go back to STEP 1 and treat AGAIN with another 15 grams of carbohydrates.

## 2018-12-17 NOTE — Progress Notes (Signed)
Name: Adrian Neal  Age/ Sex: 53 y.o., male   MRN/ DOB: [id number], [birth year]     PCP: [name], MD   Reason for Endocrinology Evaluation: Type 1 Diabetes Mellitus  Initial Endocrine Consultative Visit: 06/18/2018    PATIENT IDENTIFIER: Adrian Neal is a 53 y.o. male with a past medical history of .HTN,Hyperlipidemia and Hx of CVA  The patient has followed with Endocrinology clinic since 06/18/2018 for consultative assistance with management of his diabetes.  DIABETIC HISTORY:  Adrian Neal was diagnosed with T1DM at age 37, he has been on insulin pump since 2012. His hemoglobin A1c has ranged from 7.2% , peaking at 9.0%   In 11/2018 he switched to the T-Slim   SUBJECTIVE:   During the last visit (08/12/2018): we adjusted his I:C ratio during the day      Today (12/17/2018): Adrian Neal is here for a follow up on diabetes management and his new pump.  He checks his blood sugars 4 times daily, preprandial and bedtime since February, 2020. The patient has had hypoglycemic episodes since the last clinic visit, which typically occur 1 x /week - most often occuring during the early morning hours. The patient is symptomatic with these episodes.. Otherwise, the patient has not required any recent emergency interventions for hypoglycemia and has not had recent hospitalizations secondary to hyper or hypoglycemic episodes.    This patient with type 1 diabetes is treated with humalog (insulin pump). During the visit we were unable to remotely download the  pump basal and bolus doses. The clinical list was updated.   Current pump settings    Pump   T-Slim      Insulin type   HUMALOG    Basal rate       0000-0000  1.0 u/h       I:C ratio  0000-1100 1:11   1100-0000 1:9       Sensitivity  0000  25          AIT 0000 5 hours          Goal       0000  110          Type & Model of Pump:T-Slim  Insulin Type: Currently using Humalog   PUMP  STATISTICS: Average BG: 187  BG Readings: 4.57/ day Average Daily Carbs (g): 179  Average Total Daily Insulin: 53.15  Average Daily Basal: 23 (44 %) Average Daily Bolus: 29 (56 %)     CONTINUOUS GLUCOSE MONITORING RECORD INTERPRETATION    Dates of Recording: 8/6-8/19/20  Sensor description: Dexcom  Results statistics:   Average and SD 176/54  Time in range  14 %  % Time Above 180 77  % Time Below target 9    Glycemic patterns summary:   Hyperglycemic episodes    Hypoglycemic episodes occurred  Overnight periods:      ROS: As per HPI and as detailed below: Review of Systems  Constitutional: Negative for fever and weight loss.  Respiratory: Negative for cough and shortness of breath.   Cardiovascular: Negative for chest pain and palpitations.  Gastrointestinal: Negative for diarrhea and nausea.      HISTORY:  Past Medical History:  Past Medical History:  Diagnosis Date  . Cataract 1994   right eye  . Chronic kidney disease   . Diabetes mellitus without complication ([place])    diagnosed at age 60  . Eye problems   . Heart murmur 1996  .  High cholesterol    patient denies but take preventative medicine  . Hypertension   . Retinopathy due to secondary diabetes mellitus ([place])    right  . Sleep apnea    currently not one but looking to get one   . Stroke [facility]) 2016   Past Surgical History:  Past Surgical History:  Procedure Laterality Date  . CATARACT EXTRACTION Right 1994  . CATARACT EXTRACTION W/ INTRAOCULAR LENS IMPLANT  1994  . EYE SURGERY Right 1991   vitrectomy  . EYE SURGERY Right 1992   scar tissue removed from retina    Social History:  reports that he quit smoking about 27 years ago. He has a 6.00 pack-year smoking history. He has never used smokeless tobacco. He reports that he does not drink alcohol or use drugs. Family History:  Family History  Problem Relation Age of Onset  . Hypertension Mother   . Hyperlipidemia Mother   .  Hyperlipidemia Father   . Hypertension Father   . Diabetes Father   . Kidney disease Father        had a kidney transplant   . Stroke Brother   . Diabetes Brother   . Pulmonary fibrosis Paternal Grandmother   . Lung cancer Paternal Grandfather   . Diabetes Daughter   . Colon cancer Neg Hx   . Esophageal cancer Neg Hx   . Rectal cancer Neg Hx   . Stomach cancer Neg Hx      HOME MEDICATIONS: Allergies as of 12/17/2018   No Known Allergies     Medication List       Accurate as of December 17, 2018 11:56 AM. If you have any questions, ask your nurse or doctor.        STOP taking these medications   insulin pump Soln Stopped by: Dorita Sciara, MD   MiniMed Pro-Set Infusion 24" Misc Stopped by: Dorita Sciara, MD   MiniMed Reservoir 64ml Misc Stopped by: Dorita Sciara, MD     TAKE these medications   amLODipine 10 MG tablet Commonly known as: NORVASC Take 1 tablet (10 mg total) by mouth daily.   atorvastatin 40 MG tablet Commonly known as: LIPITOR Take 1 tablet (40 mg total) by mouth daily at 6 PM.   carvedilol 25 MG tablet Commonly known as: COREG Take 1 tablet (25 mg total) by mouth 2 (two) times daily with a meal.   clopidogrel 75 MG tablet Commonly known as: PLAVIX Take 1 tablet (75 mg total) by mouth daily. Aspirin and Plavix for 3 months, after that-stop Aspirin-take Plavix alone   Dexcom G6 Receiver Devi 1 Device by Does not apply route as directed.   Dexcom G6 Sensor Misc 3 Devices by Does not apply route as directed.   Dexcom G6 Transmitter Misc 1 Device by Does not apply route as directed.   insulin lispro 100 UNIT/ML injection Commonly known as: HumaLOG Max daily dose of 100 units via pump DX E10.59 (vials)   lisinopril 40 MG tablet Commonly known as: ZESTRIL Take 1 tablet (40 mg total) by mouth daily.   meclizine 25 MG tablet Commonly known as: ANTIVERT TAKE 1 TABLET BY MOUTH THREE TIMES DAILY AS NEEDED FOR DIZZINESS    pantoprazole 40 MG tablet Commonly known as: PROTONIX Take 1 tablet (40 mg total) by mouth daily.   Vitamin D (Ergocalciferol) 1.25 MG (50000 UT) Caps capsule Commonly known as: DRISDOL Take 1 capsule (50,000 Units total) by mouth every 7 (seven) days.  DATA REVIEWED:  Lab Results  Component Value Date   HGBA1C 8.6 (A) 06/01/2018   HGBA1C 7.7 (H) 01/20/2015   HGBA1C 7.5 (H) 01/19/2015   Lab Results  Component Value Date   MICROALBUR 28.4 (H) 10/28/2018   LDLCALC 60 10/28/2018   CREATININE 1.78 (H) 10/28/2018   Lab Results  Component Value Date   MICRALBCREAT 53.4 (H) 10/28/2018    Lab Results  Component Value Date   CHOL 107 10/28/2018   HDL 35.10 (L) 10/28/2018   LDLCALC 60 10/28/2018   LDLDIRECT 63.0 10/28/2018   TRIG 58.0 10/28/2018   CHOLHDL 3 10/28/2018         ASSESSMENT / PLAN / RECOMMENDATIONS:   1) Type 1 Diabetes Mellitus, Poorly controlled, With macrovascular , CKD III and retinopathic complications - Most recent A1c of 8.6 %. Goal A1c < 7.5 %.     - He is doing a great job with the new T-Slim pump , he was noted to have post-breakfast hyperglycemia so will adjust I:C ratio for breakfast. Will also adjust his active insulin time as below   -We have made the following changes to his pump    MEDICATIONS: Pump   T-Slim      Insulin type   HUMALOG    Basal rate       0000-0000   1.0 u/h               I:C ratio       0000  1:9      Sensitivity      0000 25      AIT      0000  4      Goal       0000  110      EDUCATION / INSTRUCTIONS:  BG monitoring instructions: Patient is instructed to check his blood sugars 4 times a day, before meals and bedtime.  Call [place] [facility] clinic if: BG persistently < 70 or > 300. . I reviewed the Rule of 15 for the treatment of hypoglycemia in detail with the patient. Literature supplied.    F/U in 3 months    Signed electronically by: Mack Guise, MD  [facility]  Endocrinology  [facility] [address] [place], [place] [address] Phone: [phone] FAX: [phone]   CC: [name], MD [place] [address] Phone: [phone]  Fax: [phone]  Return to Endocrinology clinic as below: Future Appointments  Date Time Provider [place]  01/07/2019  9:15 AM [name], MD [facility] None  01/26/2019  8:30 AM [name], MD [facility]  03/23/2019  9:30 AM Senna Lape, Melanie Crazier, MD [facility] None  06/14/2019  9:30 AM Ward Givens, NP [facility] None

## 2018-12-19 DIAGNOSIS — G4733 Obstructive sleep apnea (adult) (pediatric): Secondary | ICD-10-CM | POA: Diagnosis not present

## 2018-12-25 DIAGNOSIS — E109 Type 1 diabetes mellitus without complications: Secondary | ICD-10-CM | POA: Diagnosis not present

## 2018-12-31 ENCOUNTER — Inpatient Hospital Stay (HOSPITAL_COMMUNITY): Payer: PPO

## 2018-12-31 ENCOUNTER — Inpatient Hospital Stay (HOSPITAL_COMMUNITY)
Admission: EM | Admit: 2018-12-31 | Discharge: 2019-01-04 | DRG: 470 | Disposition: A | Discharge status: Home Health | Payer: PPO | Attending: Internal Medicine | Admitting: Internal Medicine

## 2018-12-31 ENCOUNTER — Emergency Department (HOSPITAL_COMMUNITY): Payer: PPO

## 2018-12-31 ENCOUNTER — Other Ambulatory Visit: Payer: Self-pay

## 2018-12-31 ENCOUNTER — Encounter (HOSPITAL_COMMUNITY): Payer: Self-pay

## 2018-12-31 DIAGNOSIS — Z841 Family history of disorders of kidney and ureter: Secondary | ICD-10-CM

## 2018-12-31 DIAGNOSIS — Z03818 Encounter for observation for suspected exposure to other biological agents ruled out: Secondary | ICD-10-CM | POA: Diagnosis not present

## 2018-12-31 DIAGNOSIS — N183 Chronic kidney disease, stage 3 unspecified: Secondary | ICD-10-CM

## 2018-12-31 DIAGNOSIS — Z471 Aftercare following joint replacement surgery: Secondary | ICD-10-CM | POA: Diagnosis not present

## 2018-12-31 DIAGNOSIS — Z79899 Other long term (current) drug therapy: Secondary | ICD-10-CM

## 2018-12-31 DIAGNOSIS — E109 Type 1 diabetes mellitus without complications: Secondary | ICD-10-CM | POA: Diagnosis present

## 2018-12-31 DIAGNOSIS — S72032A Displaced midcervical fracture of left femur, initial encounter for closed fracture: Secondary | ICD-10-CM | POA: Diagnosis not present

## 2018-12-31 DIAGNOSIS — N138 Other obstructive and reflux uropathy: Secondary | ICD-10-CM | POA: Diagnosis not present

## 2018-12-31 DIAGNOSIS — M25552 Pain in left hip: Secondary | ICD-10-CM | POA: Diagnosis not present

## 2018-12-31 DIAGNOSIS — Z87891 Personal history of nicotine dependence: Secondary | ICD-10-CM | POA: Diagnosis not present

## 2018-12-31 DIAGNOSIS — I69354 Hemiplegia and hemiparesis following cerebral infarction affecting left non-dominant side: Secondary | ICD-10-CM | POA: Diagnosis not present

## 2018-12-31 DIAGNOSIS — E785 Hyperlipidemia, unspecified: Secondary | ICD-10-CM | POA: Diagnosis present

## 2018-12-31 DIAGNOSIS — Z20828 Contact with and (suspected) exposure to other viral communicable diseases: Secondary | ICD-10-CM | POA: Diagnosis not present

## 2018-12-31 DIAGNOSIS — N179 Acute kidney failure, unspecified: Secondary | ICD-10-CM | POA: Diagnosis not present

## 2018-12-31 DIAGNOSIS — S7222XA Displaced subtrochanteric fracture of left femur, initial encounter for closed fracture: Secondary | ICD-10-CM | POA: Diagnosis not present

## 2018-12-31 DIAGNOSIS — H814 Vertigo of central origin: Secondary | ICD-10-CM | POA: Diagnosis present

## 2018-12-31 DIAGNOSIS — Z823 Family history of stroke: Secondary | ICD-10-CM | POA: Diagnosis not present

## 2018-12-31 DIAGNOSIS — E1065 Type 1 diabetes mellitus with hyperglycemia: Secondary | ICD-10-CM | POA: Diagnosis not present

## 2018-12-31 DIAGNOSIS — I129 Hypertensive chronic kidney disease with stage 1 through stage 4 chronic kidney disease, or unspecified chronic kidney disease: Secondary | ICD-10-CM | POA: Diagnosis not present

## 2018-12-31 DIAGNOSIS — Z825 Family history of asthma and other chronic lower respiratory diseases: Secondary | ICD-10-CM

## 2018-12-31 DIAGNOSIS — E669 Obesity, unspecified: Secondary | ICD-10-CM | POA: Diagnosis not present

## 2018-12-31 DIAGNOSIS — S72002A Fracture of unspecified part of neck of left femur, initial encounter for closed fracture: Secondary | ICD-10-CM | POA: Diagnosis not present

## 2018-12-31 DIAGNOSIS — E1022 Type 1 diabetes mellitus with diabetic chronic kidney disease: Secondary | ICD-10-CM | POA: Diagnosis not present

## 2018-12-31 DIAGNOSIS — Z833 Family history of diabetes mellitus: Secondary | ICD-10-CM

## 2018-12-31 DIAGNOSIS — E10319 Type 1 diabetes mellitus with unspecified diabetic retinopathy without macular edema: Secondary | ICD-10-CM | POA: Diagnosis not present

## 2018-12-31 DIAGNOSIS — Z794 Long term (current) use of insulin: Secondary | ICD-10-CM

## 2018-12-31 DIAGNOSIS — Y92008 Other place in unspecified non-institutional (private) residence as the place of occurrence of the external cause: Secondary | ICD-10-CM | POA: Diagnosis not present

## 2018-12-31 DIAGNOSIS — S299XXA Unspecified injury of thorax, initial encounter: Secondary | ICD-10-CM | POA: Diagnosis not present

## 2018-12-31 DIAGNOSIS — Z6830 Body mass index (BMI) 30.0-30.9, adult: Secondary | ICD-10-CM

## 2018-12-31 DIAGNOSIS — Z8349 Family history of other endocrine, nutritional and metabolic diseases: Secondary | ICD-10-CM

## 2018-12-31 DIAGNOSIS — Z01818 Encounter for other preprocedural examination: Secondary | ICD-10-CM | POA: Diagnosis not present

## 2018-12-31 DIAGNOSIS — N401 Enlarged prostate with lower urinary tract symptoms: Secondary | ICD-10-CM | POA: Diagnosis present

## 2018-12-31 DIAGNOSIS — I10 Essential (primary) hypertension: Secondary | ICD-10-CM | POA: Diagnosis not present

## 2018-12-31 DIAGNOSIS — W010XXA Fall on same level from slipping, tripping and stumbling without subsequent striking against object, initial encounter: Secondary | ICD-10-CM | POA: Diagnosis present

## 2018-12-31 DIAGNOSIS — M11262 Other chondrocalcinosis, left knee: Secondary | ICD-10-CM | POA: Diagnosis not present

## 2018-12-31 DIAGNOSIS — N1832 Chronic kidney disease, stage 3b: Secondary | ICD-10-CM | POA: Diagnosis present

## 2018-12-31 DIAGNOSIS — R079 Chest pain, unspecified: Secondary | ICD-10-CM | POA: Diagnosis not present

## 2018-12-31 DIAGNOSIS — R338 Other retention of urine: Secondary | ICD-10-CM | POA: Diagnosis not present

## 2018-12-31 DIAGNOSIS — S7292XA Unspecified fracture of left femur, initial encounter for closed fracture: Secondary | ICD-10-CM | POA: Diagnosis present

## 2018-12-31 DIAGNOSIS — G4733 Obstructive sleep apnea (adult) (pediatric): Secondary | ICD-10-CM | POA: Diagnosis not present

## 2018-12-31 DIAGNOSIS — Z8249 Family history of ischemic heart disease and other diseases of the circulatory system: Secondary | ICD-10-CM

## 2018-12-31 DIAGNOSIS — I69398 Other sequelae of cerebral infarction: Secondary | ICD-10-CM

## 2018-12-31 DIAGNOSIS — E782 Mixed hyperlipidemia: Secondary | ICD-10-CM | POA: Diagnosis not present

## 2018-12-31 DIAGNOSIS — Z96642 Presence of left artificial hip joint: Secondary | ICD-10-CM

## 2018-12-31 DIAGNOSIS — R52 Pain, unspecified: Secondary | ICD-10-CM | POA: Diagnosis not present

## 2018-12-31 DIAGNOSIS — Y93H9 Activity, other involving exterior property and land maintenance, building and construction: Secondary | ICD-10-CM

## 2018-12-31 DIAGNOSIS — Z9641 Presence of insulin pump (external) (internal): Secondary | ICD-10-CM | POA: Diagnosis present

## 2018-12-31 DIAGNOSIS — Z419 Encounter for procedure for purposes other than remedying health state, unspecified: Secondary | ICD-10-CM

## 2018-12-31 DIAGNOSIS — Z7902 Long term (current) use of antithrombotics/antiplatelets: Secondary | ICD-10-CM

## 2018-12-31 DIAGNOSIS — W19XXXA Unspecified fall, initial encounter: Secondary | ICD-10-CM | POA: Diagnosis not present

## 2018-12-31 LAB — CBC
HCT: 39.7 % (ref 39.0–52.0)
Hemoglobin: 13.3 g/dL (ref 13.0–17.0)
MCH: 29.6 pg (ref 26.0–34.0)
MCHC: 33.5 g/dL (ref 30.0–36.0)
MCV: 88.2 fL (ref 80.0–100.0)
Platelets: 130 10*3/uL — ABNORMAL LOW (ref 150–400)
RBC: 4.5 MIL/uL (ref 4.22–5.81)
RDW: 12.4 % (ref 11.5–15.5)
WBC: 11.7 10*3/uL — ABNORMAL HIGH (ref 4.0–10.5)
nRBC: 0 % (ref 0.0–0.2)

## 2018-12-31 LAB — COMPREHENSIVE METABOLIC PANEL
ALT: 40 U/L (ref 0–44)
AST: 26 U/L (ref 15–41)
Albumin: 3.9 g/dL (ref 3.5–5.0)
Alkaline Phosphatase: 117 U/L (ref 38–126)
Anion gap: 12 (ref 5–15)
BUN: 24 mg/dL — ABNORMAL HIGH (ref 6–20)
CO2: 22 mmol/L (ref 22–32)
Calcium: 8.9 mg/dL (ref 8.9–10.3)
Chloride: 108 mmol/L (ref 98–111)
Creatinine, Ser: 2.14 mg/dL — ABNORMAL HIGH (ref 0.61–1.24)
GFR calc Af Amer: 40 mL/min — ABNORMAL LOW (ref >60)
GFR calc non Af Amer: 34 mL/min — ABNORMAL LOW (ref >60)
Glucose, Bld: 122 mg/dL — ABNORMAL HIGH (ref 70–99)
Potassium: 3.8 mmol/L (ref 3.5–5.1)
Sodium: 142 mmol/L (ref 135–145)
Total Bilirubin: 0.9 mg/dL (ref 0.3–1.2)
Total Protein: 6.6 g/dL (ref 6.5–8.1)

## 2018-12-31 LAB — TYPE AND SCREEN
ABO/RH(D): O POS
Antibody Screen: NEGATIVE

## 2018-12-31 LAB — PROTIME-INR
INR: 1.1 (ref 0.8–1.2)
Prothrombin Time: 13.9 seconds (ref 11.4–15.2)

## 2018-12-31 MED ORDER — ONDANSETRON HCL 4 MG/2ML IJ SOLN
4.0000 mg | Freq: Once | INTRAMUSCULAR | Status: AC
  Administered 2018-12-31: 4 mg via INTRAVENOUS
  Filled 2018-12-31: qty 2

## 2018-12-31 MED ORDER — HYDROMORPHONE HCL 1 MG/ML IJ SOLN
1.0000 mg | Freq: Once | INTRAMUSCULAR | Status: AC
  Administered 2018-12-31: 1 mg via INTRAVENOUS
  Filled 2018-12-31: qty 1

## 2018-12-31 MED ORDER — ONDANSETRON HCL 4 MG/2ML IJ SOLN
4.0000 mg | Freq: Once | INTRAMUSCULAR | Status: AC
  Administered 2018-12-31: 4 mg via INTRAVENOUS

## 2018-12-31 MED ORDER — SODIUM CHLORIDE 0.9 % IV BOLUS
1000.0000 mL | Freq: Once | INTRAVENOUS | Status: AC
  Administered 2018-12-31: 1000 mL via INTRAVENOUS

## 2018-12-31 MED ORDER — ONDANSETRON HCL 4 MG/2ML IJ SOLN
INTRAMUSCULAR | Status: AC
  Filled 2018-12-31: qty 2

## 2018-12-31 NOTE — ED Notes (Signed)
Report given to Caryl Pina, RN on 5N

## 2018-12-31 NOTE — ED Triage Notes (Signed)
Pt brought by [facility], Pt was in driveway and tripped over cord and fell on left hip and shoulders. Pt having 8/10 left hip pain. No deformity or shortening noted of left leg. Pt on blood thinner, denies hitting head. Pt A&Ox4. 2+ left pedal pulse, pt able to move leg.

## 2018-12-31 NOTE — Progress Notes (Signed)
I have spoken with Dr. Ashok Cordia regarding patient and his left hip injury.  Will need operative treatment.  Appreciate hospitalist admission, and NPO at [facility] tonight.  Will follow up on hip CT scan for definitive fixation type.  Likely surgery with Dr. Lyla Glassing tomorrow.

## 2018-12-31 NOTE — ED Notes (Signed)
ED TO INPATIENT HANDOFF REPORT  ED Nurse Name and Phone #: Lorrin Goodell [phone]  S Name/Age/Gender Adrian Neal 53 y.o. male Room/Bed: 024C/024C  Code Status   Code Status: Prior  Home/SNF/Other Home Patient oriented to: self, place, time and situation Is this baseline? Yes   Triage Complete: Triage complete  Chief Complaint Fall on thinners, L Hip pain  Triage Note Pt brought by [facility], Pt was in driveway and tripped over cord and fell on left hip and shoulders. Pt having 8/10 left hip pain. No deformity or shortening noted of left leg. Pt on blood thinner, denies hitting head. Pt A&Ox4. 2+ left pedal pulse, pt able to move leg.    Allergies No Known Allergies  Level of Care/Admitting Diagnosis ED Disposition    ED Disposition Condition [facility] Area: [place] [[id number]]  Level of Care: Med-Surg [16]  Covid Evaluation: Asymptomatic Screening Protocol (No Symptoms)  Diagnosis: Closed left femoral fracture [facility]) [[id number]]  Admitting Physician: Elwyn Reach [[id number]]  Attending Physician: Elwyn Reach [[id number]]  Estimated length of stay: past midnight tomorrow  Certification:: I certify this patient will need inpatient services for at least 2 midnights  PT Class (Do Not Modify): Inpatient [101]  PT Acc Code (Do Not Modify): Private [1]       B Medical/Surgery History Past Medical History:  Diagnosis Date  . Cataract 1994   right eye  . Chronic kidney disease   . Diabetes mellitus without complication ([place])    diagnosed at age 24  . Eye problems   . Heart murmur 1996  . High cholesterol    patient denies but take preventative medicine  . Hypertension   . 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 Sleep apnea    currently not one but looking to get one   . Stroke [facility]) 2016   Past Surgical History:  Procedure Laterality Date  . CATARACT EXTRACTION Right 1994  . CATARACT EXTRACTION W/ INTRAOCULAR  LENS IMPLANT  1994  . EYE SURGERY Right 1991   vitrectomy  . EYE SURGERY Right 1992   scar tissue removed from retina     A IV Location/Drains/Wounds Patient Lines/Drains/Airways Status   Active Line/Drains/Airways    Name:   Placement date:   Placement time:   Site:   Days:   Peripheral IV 12/31/18 Left Antecubital   12/31/18    2058    Antecubital   less than 1          Intake/Output Last 24 hours  Intake/Output Summary (Last 24 hours) at 12/31/2018 2258 Last data filed at 12/31/2018 2213 Gross per 24 hour  Intake 1000 ml  Output -  Net 1000 ml    Labs/Imaging Results for orders placed or performed during the hospital encounter of 12/31/18 (from the past 48 hour(s))  CBC     Status: Abnormal   Collection Time: 12/31/18  8:50 PM  Result Value Ref Range   WBC 11.7 (H) 4.0 - 10.5 K/uL   RBC 4.50 4.22 - 5.81 MIL/uL   Hemoglobin 13.3 13.0 - 17.0 g/dL   HCT 39.7 39.0 - 52.0 %   MCV 88.2 80.0 - 100.0 fL   MCH 29.6 26.0 - 34.0 pg   MCHC 33.5 30.0 - 36.0 g/dL   RDW 12.4 11.5 - 15.5 %   Platelets 130 (L) 150 - 400 K/uL    Comment: REPEATED TO VERIFY  nRBC 0.0 0.0 - 0.2 %    Comment: Performed at [facility], [place] [address] [place], [place] [address]  Type and screen     Status: None   Collection Time: 12/31/18  8:50 PM  Result Value Ref Range   ABO/RH(D) O POS    Antibody Screen NEG    Sample Expiration      01/03/2019,2359 Performed at [facility], [place] [address], [place], [place] [address]   Comprehensive metabolic panel     Status: Abnormal   Collection Time: 12/31/18  8:50 PM  Result Value Ref Range   Sodium 142 135 - 145 mmol/L   Potassium 3.8 3.5 - 5.1 mmol/L   Chloride 108 98 - 111 mmol/L   CO2 22 22 - 32 mmol/L   Glucose, Bld 122 (H) 70 - 99 mg/dL   BUN 24 (H) 6 - 20 mg/dL   Creatinine, Ser 2.14 (H) 0.61 - 1.24 mg/dL   Calcium 8.9 8.9 - 10.3 mg/dL   Total Protein 6.6 6.5 - 8.1 g/dL   Albumin 3.9 3.5 - 5.0 g/dL   AST 26 15 - 41 U/L   ALT  40 0 - 44 U/L   Alkaline Phosphatase 117 38 - 126 U/L   Total Bilirubin 0.9 0.3 - 1.2 mg/dL   GFR calc non Af Amer 34 (L) >60 mL/min   GFR calc Af Amer 40 (L) >60 mL/min   Anion gap 12 5 - 15    Comment: Performed at [facility] [address] [place], [place] [address]  Protime-INR     Status: None   Collection Time: 12/31/18  8:50 PM  Result Value Ref Range   Prothrombin Time 13.9 11.4 - 15.2 seconds   INR 1.1 0.8 - 1.2    Comment: (NOTE) INR goal varies based on device and disease states. Performed at [facility], [place] [address], [place], [place] [address]   ABO/Rh     Status: None (Preliminary result)   Collection Time: 12/31/18  8:50 PM  Result Value Ref Range   ABO/RH(D)      O POS Performed at [facility] [address], [place], [place] [address]    Dg Chest 1 View  Result Date: 12/31/2018 CLINICAL DATA:  Fall with chest pain EXAM: CHEST  1 VIEW COMPARISON:  01/19/2015 FINDINGS: The heart size and mediastinal contours are within normal limits. Both lungs are clear. The visualized skeletal structures are unremarkable. IMPRESSION: No active disease. Electronically Signed   By: Donavan Foil M.D.   On: 12/31/2018 21:04   Ct Hip Left Wo Contrast  Result Date: 12/31/2018 CLINICAL DATA:  Acute pain due to trauma EXAM: CT OF THE LEFT HIP WITHOUT CONTRAST TECHNIQUE: Multidetector CT imaging of the left hip was performed according to the standard protocol. Multiplanar CT image reconstructions were also generated. COMPARISON:  X-ray from same day. FINDINGS: Bones/Joint/Cartilage Again noted is an angulated and impacted subcapital/transcervical fracture of the proximal left femur. There is no dislocation. Ligaments Suboptimally assessed by CT. Muscles and Tendons Unremarkable Soft tissues The urinary bladder is significantly distended. The prostate gland is enlarged. IMPRESSION: 1. Acute fracture of the proximal left femur as detailed above. 2. Significantly  distended urinary bladder. Prostatomegaly is noted. Electronically Signed   By: Constance Holster M.D.   On: 12/31/2018 22:29   Dg Hip Unilat W Or W/o Pelvis 2-3 Views Left  Result Date: 12/31/2018 CLINICAL DATA:  Left  hip pain after fall. Tripped over cord in driveway. EXAM: DG HIP (WITH OR WITHOUT PELVIS) 2-3V LEFT COMPARISON:  None. FINDINGS: Minimally displaced left femoral neck fracture with mild angulation. Femoral head remains seated. Pubic rami are intact. Pubic symphysis and sacroiliac joints are congruent. Mild underlying hip osteoarthritis. IMPRESSION: Minimally displaced and angulated left femoral neck fracture. Electronically Signed   By: Keith Rake M.D.   On: 12/31/2018 19:21    Pending Labs Unresulted Labs (From admission, onward)    Start     Ordered   12/31/18 2050  SARS CORONAVIRUS 2 (TAT 6-24 HRS) Nasopharyngeal Nasopharyngeal Swab  (Asymptomatic/Tier 2 Patients Labs)  Once,   STAT    Question Answer Comment  Is this test for diagnosis or screening Screening   Symptomatic for COVID-19 as defined by CDC No   Hospitalized for COVID-19 No   Admitted to ICU for COVID-19 No   Previously tested for COVID-19 No   Resident in a congregate (group) care setting No   Employed in healthcare setting No      12/31/18 2050   Signed and Held  Hemoglobin A1c  Once,   R    Comments: To assess prior glycemic control    Signed and Held   Signed and Held  HIV antibody (Routine Testing)  Once,   R     Signed and Held   Signed and Held  Comprehensive metabolic panel  Tomorrow morning,   R     Signed and Held   Signed and Held  CBC  Tomorrow morning,   R     Signed and Held          Vitals/Pain Today's Vitals   12/31/18 2100 12/31/18 2130 12/31/18 2213 12/31/18 2230  BP: (!) 168/75 (!) 151/70  (!) 164/74  Pulse: (!) 107 (!) 107  (!) 111  Resp: 10 14  15   Temp:      TempSrc:      SpO2: 95% 95%  95%  Weight:      Height:      PainSc:   6      Isolation Precautions No  active isolations  Medications Medications  ondansetron (ZOFRAN) injection 4 mg (has no administration in time range)  ondansetron (ZOFRAN) 4 MG/2ML injection (has no administration in time range)  sodium chloride 0.9 % bolus 1,000 mL (0 mLs Intravenous Stopped 12/31/18 2213)  HYDROmorphone (DILAUDID) injection 1 mg (1 mg Intravenous Given 12/31/18 2058)  ondansetron (ZOFRAN) injection 4 mg (4 mg Intravenous Given 12/31/18 2058)    Mobility walks Low fall risk   Focused Assessments    R Recommendations: See Admitting Provider Note  Report given to:   Additional Notes:

## 2018-12-31 NOTE — ED Provider Notes (Signed)
[facility] EMERGENCY DEPARTMENT Provider Note   CSN: [id number] Arrival date & time: 12/31/18  1800     History   Chief Complaint Chief Complaint  Patient presents with  . Fall    HPI Adrian Neal is a 53 y.o. male.     Patient presents via EMS s/p mechanical fall. Patient states was using blowing on driveway, when right foot got caught/wrapped up in cords, causing him to trip and fall onto left hip. C/o left hip pain, acute onset post fall, moderate-severe, constant, dull, non radiating, worse w movement. Denies head contusion or headache. No loc. No neck or back pain. Denies chest pain or sob. No abd pain or nv. On plavix, no other anticoag use - no abnormal bruising or bleeding. Felt fine, asymptomatic, prior to trip and fall. Remote hx cva. No new numbness or weakness.   The history is provided by the patient and the EMS personnel.  Fall Pertinent negatives include no chest pain, no abdominal pain, no headaches and no shortness of breath.    Past Medical History:  Diagnosis Date  . Cataract 1994   right eye  . Chronic kidney disease   . Diabetes mellitus without complication ([place])    diagnosed at age 49  . Eye problems   . Heart murmur 1996  . High cholesterol    patient denies but take preventative medicine  . Hypertension   . 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 Sleep apnea    currently not one but looking to get one   . Stroke [facility]) 2016    Patient Active Problem List   Diagnosis Date Noted  . Diabetes mellitus type I ([place]) 12/17/2018  . Type 1 diabetes mellitus with stage 3 chronic kidney disease ([place]) 08/12/2018  . 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 Type 1 diabetes mellitus with hyperglycemia ([place]) 08/12/2018  . Vitamin D deficiency 07/15/2018  . CKD (chronic kidney disease) stage 3, GFR 30-59 ml/min (H[facility]) 06/16/2018  . Healthcare maintenance 06/15/2018  . History  of CVA (cerebrovascular accident) 06/01/2018  . OSA (obstructive sleep apnea) 03/29/2015  . Stroke ([place])   . HLD (hyperlipidemia) 11/22/2014  . 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 Ataxia   . CVA (cerebral infarction) 08/03/2014  . Insulin dependent diabetes mellitus ([place])   . Cerebral thrombosis with cerebral infarction ([place]) 07/28/2014  . Intractable nausea and vomiting 07/27/2014  . Vertigo 07/27/2014  . Essential hypertension 07/27/2014  . Type 1 diabetes mellitus ([place]) 05/29/2010  . Hyperlipidemia 05/29/2010  . PLANTAR FASCIITIS 05/29/2010    Past Surgical History:  Procedure Laterality Date  . CATARACT EXTRACTION Right 1994  . CATARACT EXTRACTION W/ INTRAOCULAR LENS IMPLANT  1994  . EYE SURGERY Right 1991   vitrectomy  . EYE SURGERY Right 1992   scar tissue removed from retina        Home Medications    Prior to Admission medications   Medication Sig Start Date End Date Taking? Authorizing Provider  amLODipine (NORVASC) 10 MG tablet Take 1 tablet (10 mg total) by mouth daily. 10/27/18   [name], MD  atorvastatin (LIPITOR) 40 MG tablet Take 1 tablet (40 mg total) by mouth daily at 6 PM. 10/27/18   [name], MD  carvedilol (COREG) 25 MG tablet Take 1 tablet (25 mg total) by mouth 2 (two) times daily with a  meal. 10/27/18   [name], MD  clopidogrel (PLAVIX) 75 MG tablet Take 1 tablet (75 mg total) by mouth daily. Aspirin and Plavix for 3 months, after that-stop Aspirin-take Plavix alone 10/27/18   [name], MD  Continuous Blood Gluc Receiver (DEXCOM G6 RECEIVER) DEVI 1 Device by Does not apply route as directed. 08/12/18   Shamleffer, Melanie Crazier, MD  Continuous Blood Gluc Sensor (DEXCOM G6 SENSOR) MISC 3 Devices by Does not apply route as directed. 08/12/18   Shamleffer, Melanie Crazier, MD  Continuous Blood Gluc Transmit (DEXCOM G6 TRANSMITTER) MISC 1 Device by Does not apply  route as directed. 08/12/18   Shamleffer, Melanie Crazier, MD  insulin lispro (HUMALOG) 100 UNIT/ML injection Max daily dose of 100 units via pump DX E10.59 (vials) 11/10/18   Philemon Kingdom, MD  lisinopril (ZESTRIL) 40 MG tablet Take 1 tablet (40 mg total) by mouth daily. 10/27/18   [name], MD  meclizine (ANTIVERT) 25 MG tablet TAKE 1 TABLET BY MOUTH THREE TIMES DAILY AS NEEDED FOR DIZZINESS 10/19/18   [name], MD  pantoprazole (PROTONIX) 40 MG tablet Take 1 tablet (40 mg total) by mouth daily. 07/29/18   [name], MD  Vitamin D, Ergocalciferol, (DRISDOL) 1.25 MG (50000 UT) CAPS capsule Take 1 capsule (50,000 Units total) by mouth every 7 (seven) days. 10/29/18   [name], MD    Family History Family History  Problem Relation Age of Onset  . Hypertension Mother   . Hyperlipidemia Mother   . Hyperlipidemia Father   . Hypertension Father   . Diabetes Father   . Kidney disease Father        had a kidney transplant   . Stroke Brother   . Diabetes Brother   . Pulmonary fibrosis Paternal Grandmother   . Lung cancer Paternal Grandfather   . Diabetes Daughter   . Colon cancer Neg Hx   . Esophageal cancer Neg Hx   . Rectal cancer Neg Hx   . Stomach cancer Neg Hx     Social History Social History   Tobacco Use  . Smoking status: Former Smoker    Packs/day: 1.00    Years: 6.00    Pack years: 6.00    Quit date: 04/30/1991    Years since quitting: 27.6  . Smokeless tobacco: Never Used  Substance Use Topics  . Alcohol use: No  . Drug use: Never     Allergies   Patient has no known allergies.   Review of Systems Review of Systems  Constitutional: Negative for fever.  HENT: Negative for nosebleeds.   Eyes: Negative for pain and visual disturbance.  Respiratory: Negative for shortness of breath.   Cardiovascular: Negative for chest pain.  Gastrointestinal: Negative for abdominal pain, nausea and vomiting.  Genitourinary:  Negative for flank pain.  Musculoskeletal: Negative for back pain and neck pain.  Skin: Negative for wound.  Neurological: Negative for weakness, numbness and headaches.  Hematological: Does not bruise/bleed easily.  Psychiatric/Behavioral: Negative for confusion.     Physical Exam Updated Vital Signs BP (!) 174/76 (BP Location: Right Arm)   Pulse 98   Temp 98.6 F (37 C) (Oral)   Resp 16   Ht 1.727 m (5\' 8" )   Wt 90.7 kg   SpO2 97%   BMI 30.41 kg/m   Physical Exam Vitals signs and nursing note reviewed.  Constitutional:      Appearance: Normal appearance. He is well-developed.  HENT:  Head: Atraumatic.     Comments: No head, face, or scalp contusion    Nose: Nose normal.     Mouth/Throat:     Mouth: Mucous membranes are moist.     Pharynx: Oropharynx is clear.  Eyes:     General: No scleral icterus.    Conjunctiva/sclera: Conjunctivae normal.     Pupils: Pupils are equal, round, and reactive to light.  Neck:     Musculoskeletal: Normal range of motion and neck supple. No neck rigidity.     Trachea: No tracheal deviation.  Cardiovascular:     Rate and Rhythm: Normal rate and regular rhythm.     Pulses: Normal pulses.     Heart sounds: Normal heart sounds. No murmur. No friction rub. No gallop.   Pulmonary:     Effort: Pulmonary effort is normal. No accessory muscle usage or respiratory distress.     Breath sounds: Normal breath sounds.  Chest:     Chest wall: No tenderness.  Abdominal:     General: Bowel sounds are normal. There is no distension.     Palpations: Abdomen is soft.     Tenderness: There is no abdominal tenderness. There is no guarding.     Comments: No abd contusion or bruising.   Genitourinary:    Comments: No cva tenderness. Musculoskeletal:        General: No swelling.     Comments: CTLS spine, non tender, aligned, no step off. Tenderness/pain left hip, otherwise good rom bil ext without pain or focal bony tenderness. Distal pulses palp  bil. No significant sts to hip area, compartment of leg are soft, not tense, no significant swelling noted.   Skin:    General: Skin is warm and dry.     Findings: No rash.  Neurological:     Mental Status: He is alert.     Comments: Alert, speech clear. Motor grossly intact bil. sens grossly intact bil.   Psychiatric:        Mood and Affect: Mood normal.      ED Treatments / Results  Labs (all labs ordered are listed, but only abnormal results are displayed) Results for orders placed or performed during the hospital encounter of 12/31/18  CBC  Result Value Ref Range   WBC 11.7 (H) 4.0 - 10.5 K/uL   RBC 4.50 4.22 - 5.81 MIL/uL   Hemoglobin 13.3 13.0 - 17.0 g/dL   HCT 39.7 39.0 - 52.0 %   MCV 88.2 80.0 - 100.0 fL   MCH 29.6 26.0 - 34.0 pg   MCHC 33.5 30.0 - 36.0 g/dL   RDW 12.4 11.5 - 15.5 %   Platelets 130 (L) 150 - 400 K/uL   nRBC 0.0 0.0 - 0.2 %  Comprehensive metabolic panel  Result Value Ref Range   Sodium 142 135 - 145 mmol/L   Potassium 3.8 3.5 - 5.1 mmol/L   Chloride 108 98 - 111 mmol/L   CO2 22 22 - 32 mmol/L   Glucose, Bld 122 (H) 70 - 99 mg/dL   BUN 24 (H) 6 - 20 mg/dL   Creatinine, Ser 2.14 (H) 0.61 - 1.24 mg/dL   Calcium 8.9 8.9 - 10.3 mg/dL   Total Protein 6.6 6.5 - 8.1 g/dL   Albumin 3.9 3.5 - 5.0 g/dL   AST 26 15 - 41 U/L   ALT 40 0 - 44 U/L   Alkaline Phosphatase 117 38 - 126 U/L   Total Bilirubin 0.9 0.3 - 1.2 mg/dL  GFR calc non Af Amer 34 (L) >60 mL/min   GFR calc Af Amer 40 (L) >60 mL/min   Anion gap 12 5 - 15  Protime-INR  Result Value Ref Range   Prothrombin Time 13.9 11.4 - 15.2 seconds   INR 1.1 0.8 - 1.2  Type and screen  Result Value Ref Range   ABO/RH(D) PENDING    Antibody Screen PENDING    Sample Expiration      01/03/2019,2359 Performed at [facility] [address] [place], [place] [address]    Dg Chest 1 View  Result Date: 12/31/2018 CLINICAL DATA:  Fall with chest pain EXAM: CHEST  1 VIEW COMPARISON:  01/19/2015  FINDINGS: The heart size and mediastinal contours are within normal limits. Both lungs are clear. The visualized skeletal structures are unremarkable. IMPRESSION: No active disease. Electronically Signed   By: Donavan Foil M.D.   On: 12/31/2018 21:04   Dg Hip Unilat W Or W/o Pelvis 2-3 Views Left  Result Date: 12/31/2018 CLINICAL DATA:  Left hip pain after fall. Tripped over cord in driveway. EXAM: DG HIP (WITH OR WITHOUT PELVIS) 2-3V LEFT COMPARISON:  None. FINDINGS: Minimally displaced left femoral neck fracture with mild angulation. Femoral head remains seated. Pubic rami are intact. Pubic symphysis and sacroiliac joints are congruent. Mild underlying hip osteoarthritis. IMPRESSION: Minimally displaced and angulated left femoral neck fracture. Electronically Signed   By: Keith Rake M.D.   On: 12/31/2018 19:21    EKG EKG Interpretation  Date/Time:  Thursday December 31 2018 21:06:30 EDT Ventricular Rate:  108 PR Interval:    QRS Duration: 88 QT Interval:  333 QTC Calculation: 447 R Axis:   39 Text Interpretation:  Sinus tachycardia Confirmed by Lajean Saver [phone]) on 12/31/2018 9:24:02 PM   Radiology Dg Chest 1 View  Result Date: 12/31/2018 CLINICAL DATA:  Fall with chest pain EXAM: CHEST  1 VIEW COMPARISON:  01/19/2015 FINDINGS: The heart size and mediastinal contours are within normal limits. Both lungs are clear. The visualized skeletal structures are unremarkable. IMPRESSION: No active disease. Electronically Signed   By: Donavan Foil M.D.   On: 12/31/2018 21:04   Dg Hip Unilat W Or W/o Pelvis 2-3 Views Left  Result Date: 12/31/2018 CLINICAL DATA:  Left hip pain after fall. Tripped over cord in driveway. EXAM: DG HIP (WITH OR WITHOUT PELVIS) 2-3V LEFT COMPARISON:  None. FINDINGS: Minimally displaced left femoral neck fracture with mild angulation. Femoral head remains seated. Pubic rami are intact. Pubic symphysis and sacroiliac joints are congruent. Mild underlying hip  osteoarthritis. IMPRESSION: Minimally displaced and angulated left femoral neck fracture. Electronically Signed   By: Keith Rake M.D.   On: 12/31/2018 19:21    Procedures Procedures (including critical care time)  Medications Ordered in ED Medications - No data to display   Initial Impression / Assessment and Plan / ED Course  I have reviewed the triage vital signs and the nursing notes.  Pertinent labs & imaging results that were available during my care of the patient were reviewed by me and considered in my medical decision making (see chart for details).  Imaging study ordered.   Reviewed nursing notes and prior charts for additional history.   Dilaudid 1 mg iv. zofran iv. Pain improved.   xrays reviewed by me - left fem neck fx.   Consulted orthopedist on call - discussed pt, hx, meds, fx with Dr Stann Mainland - he will see, requests medicine admit, states despite plavix, potentially may  do surgery tomorrow, so NPO post mn.   Iv ns bolus.   Labs reviewed by me - lytes normal. CKD. hgb normal.   Hospitalist consulted for admission.       Final Clinical Impressions(s) / ED Diagnoses   Final diagnoses:  None    ED Discharge Orders    None       Lajean Saver, MD 12/31/18 2136

## 2018-12-31 NOTE — H&P (Signed)
History and Physical   Adrian Neal XTK:[id number] DOB: [birth year] DOA: 12/31/2018  Referring MD/NP/PA: Dr. Lajean Saver  PCP: [name], MD   Outpatient Specialists: None  Patient coming from: Home  Chief Complaint: Fall  HPI: Adrian Neal is a 53 y.o. male with medical history significant of type 1 diabetes on insulin pump, chronic kidney disease stage III, diabetic nephropathy and retinopathy, obstructive sleep apnea, morbid obesity who sustained a mechanical fall at home.  He was using the blower on his own driveway when his right foot got caught up in the blower cords and he tripped and fell on the left hip.  He immediately felt a pop and pain on the left side.  Pain is currently 7 out of 10.  Associated with inability to put weight on the left side.  He was seen in the ER and found to have left femoral neck fracture.  Orthopedic consulted and patient is being admitted to the hospital for possible repair tomorrow.  He denied any fever or chills, no fever, no nausea vomiting or diarrhea.  He has been consistent and compliant with his medications.  His blood sugar is notably between 120s to 150s..  ED Course: Temperature is 99.1 blood pressure 178/73 pulse 111 respiratory of 21 oxygen sat is 95% room air.  CT of the hip showed acute fracture of the proximal left femur also significantly distended urinary bladder with prostatomegaly.  8142 potassium 3.8 chloride 108 CO2 22 glucose 122 BUN 25 creatinine 2.14.  Rest of chemistry and CBC within normal.  Review of Systems: As per HPI otherwise 10 point review of systems negative.    Past Medical History:  Diagnosis Date   Cataract 1994   right eye   Chronic kidney disease    Diabetes mellitus without complication ([place])    diagnosed at age 24   Eye problems    Heart murmur 1996   High cholesterol    patient denies but take preventative medicine   Hypertension    Retinopathy due to secondary diabetes mellitus  ([place])    right   Sleep apnea    currently not one but looking to get one    Stroke [facility]) 2016    Past Surgical History:  Procedure Laterality Date   CATARACT EXTRACTION Right 1994   CATARACT EXTRACTION W/ INTRAOCULAR LENS IMPLANT  1994   EYE SURGERY Right 1991   vitrectomy   EYE SURGERY Right 1992   scar tissue removed from retina     reports that he quit smoking about 27 years ago. He has a 6.00 pack-year smoking history. He has never used smokeless tobacco. He reports that he does not drink alcohol or use drugs.  No Known Allergies  Family History  Problem Relation Age of Onset   Hypertension Mother    Hyperlipidemia Mother    Hyperlipidemia Father    Hypertension Father    Diabetes Father    Kidney disease Father        had a kidney transplant    Stroke Brother    Diabetes Brother    Pulmonary fibrosis Paternal Grandmother    Lung cancer Paternal Grandfather    Diabetes Daughter    Colon cancer Neg Hx    Esophageal cancer Neg Hx    Rectal cancer Neg Hx    Stomach cancer Neg Hx      Prior to Admission medications   Medication Sig Start Date End Date Taking? Authorizing Provider  amLODipine (NORVASC)  10 MG tablet Take 1 tablet (10 mg total) by mouth daily. 10/27/18  Yes [name], MD  atorvastatin (LIPITOR) 40 MG tablet Take 1 tablet (40 mg total) by mouth daily at 6 PM. Patient taking differently: Take 40 mg by mouth daily.  10/27/18  Yes [name], MD  carvedilol (COREG) 25 MG tablet Take 1 tablet (25 mg total) by mouth 2 (two) times daily with a meal. 10/27/18  Yes [name], MD  clopidogrel (PLAVIX) 75 MG tablet Take 1 tablet (75 mg total) by mouth daily. Aspirin and Plavix for 3 months, after that-stop Aspirin-take Plavix alone Patient taking differently: Take 75 mg by mouth daily.  10/27/18  Yes [name], MD  insulin lispro (HUMALOG) 100 UNIT/ML injection Max daily dose of 100 units via pump  DX E10.59 (vials) 11/10/18  Yes Philemon Kingdom, MD  lisinopril (ZESTRIL) 40 MG tablet Take 1 tablet (40 mg total) by mouth daily. 10/27/18  Yes [name], MD  meclizine (ANTIVERT) 25 MG tablet TAKE 1 TABLET BY MOUTH THREE TIMES DAILY AS NEEDED FOR DIZZINESS Patient taking differently: Take 25 mg by mouth 3 (three) times daily as needed for dizziness.  10/19/18  Yes [name], MD  pantoprazole (PROTONIX) 40 MG tablet Take 1 tablet (40 mg total) by mouth daily. 07/29/18  Yes [name], MD  Vitamin D, Ergocalciferol, (DRISDOL) 1.25 MG (50000 UT) CAPS capsule Take 1 capsule (50,000 Units total) by mouth every 7 (seven) days. 10/29/18  Yes [name], MD  Continuous Blood Gluc Receiver (DEXCOM G6 RECEIVER) DEVI 1 Device by Does not apply route as directed. 08/12/18   Shamleffer, Melanie Crazier, MD  Continuous Blood Gluc Sensor (DEXCOM G6 SENSOR) MISC 3 Devices by Does not apply route as directed. 08/12/18   Shamleffer, Melanie Crazier, MD  Continuous Blood Gluc Transmit (DEXCOM G6 TRANSMITTER) MISC 1 Device by Does not apply route as directed. 08/12/18   Shamleffer, Melanie Crazier, MD    Physical Exam: Vitals:   12/31/18 2030 12/31/18 2100 12/31/18 2130 12/31/18 2230  BP: (!) 176/73 (!) 168/75 (!) 151/70 (!) 164/74  Pulse: (!) 108 (!) 107 (!) 107 (!) 111  Resp: (!) 21 10 14 15   Temp:      TempSrc:      SpO2: 95% 95% 95% 95%  Weight:      Height:          Constitutional: NAD, calm, comfortable Vitals:   12/31/18 2030 12/31/18 2100 12/31/18 2130 12/31/18 2230  BP: (!) 176/73 (!) 168/75 (!) 151/70 (!) 164/74  Pulse: (!) 108 (!) 107 (!) 107 (!) 111  Resp: (!) 21 10 14 15   Temp:      TempSrc:      SpO2: 95% 95% 95% 95%  Weight:      Height:       Eyes: PERRL, lids and conjunctivae normal ENMT: Mucous membranes are moist. Posterior pharynx clear of any exudate or lesions.Normal dentition.  Neck: normal, supple, no masses, no  thyromegaly Respiratory: clear to auscultation bilaterally, no wheezing, no crackles. Normal respiratory effort. No accessory muscle use.  Cardiovascular: Regular rate and rhythm, no murmurs / rubs / gallops. No extremity edema. 2+ pedal pulses. No carotid bruits.  Abdomen: no tenderness, no masses palpated. No hepatosplenomegaly. Bowel sounds positive.  Musculoskeletal: no clubbing / cyanosis. No joint deformity upper and lower extremities.  Left lateral rotation and shortening tender to touch, no contractures. Normal muscle tone.  Skin:  no rashes, lesions, ulcers. No induration Neurologic: CN 2-12 grossly intact. Sensation intact, DTR normal. Strength 5/5 in all 4.  Psychiatric: Normal judgment and insight. Alert and oriented x 3. Normal mood.     Labs on Admission: I have personally reviewed following labs and imaging studies  CBC: Recent Labs  Lab 12/31/18 2050  WBC 11.7*  HGB 13.3  HCT 39.7  MCV 88.2  PLT 347*   Basic Metabolic Panel: Recent Labs  Lab 12/31/18 2050  NA 142  K 3.8  CL 108  CO2 22  GLUCOSE 122*  BUN 24*  CREATININE 2.14*  CALCIUM 8.9   GFR: Estimated Creatinine Clearance: 43.6 mL/min (A) (by C-G formula based on SCr of 2.14 mg/dL (H)). Liver Function Tests: Recent Labs  Lab 12/31/18 2050  AST 26  ALT 40  ALKPHOS 117  BILITOT 0.9  PROT 6.6  ALBUMIN 3.9   No results for input(s): LIPASE, AMYLASE in the last 168 hours. No results for input(s): AMMONIA in the last 168 hours. Coagulation Profile: Recent Labs  Lab 12/31/18 2050  INR 1.1   Cardiac Enzymes: No results for input(s): CKTOTAL, CKMB, CKMBINDEX, TROPONINI in the last 168 hours. BNP (last 3 results) No results for input(s): PROBNP in the last 8760 hours. HbA1C: No results for input(s): HGBA1C in the last 72 hours. CBG: No results for input(s): GLUCAP in the last 168 hours. Lipid Profile: No results for input(s): CHOL, HDL, LDLCALC, TRIG, CHOLHDL, LDLDIRECT in the last 72  hours. Thyroid Function Tests: No results for input(s): TSH, T4TOTAL, FREET4, T3FREE, THYROIDAB in the last 72 hours. Anemia Panel: No results for input(s): VITAMINB12, FOLATE, FERRITIN, TIBC, IRON, RETICCTPCT in the last 72 hours. Urine analysis:    Component Value Date/Time   COLORURINE YELLOW 10/28/2018 0839   APPEARANCEUR CLEAR 10/28/2018 0839   LABSPEC 1.015 10/28/2018 0839   PHURINE 5.5 10/28/2018 0839   GLUCOSEU NEGATIVE 10/28/2018 0839   HGBUR NEGATIVE 10/28/2018 0839   BILIRUBINUR NEGATIVE 10/28/2018 0839   KETONESUR NEGATIVE 10/28/2018 0839   PROTEINUR 30 (A) 01/19/2015 2013   UROBILINOGEN 0.2 10/28/2018 0839   NITRITE NEGATIVE 10/28/2018 0839   LEUKOCYTESUR NEGATIVE 10/28/2018 0839   Sepsis Labs: @LABRCNTIP (procalcitonin:4,lacticidven:4) )No results found for this or any previous visit (from the past 240 hour(s)).   Radiological Exams on Admission: Dg Chest 1 View  Result Date: 12/31/2018 CLINICAL DATA:  Fall with chest pain EXAM: CHEST  1 VIEW COMPARISON:  01/19/2015 FINDINGS: The heart size and mediastinal contours are within normal limits. Both lungs are clear. The visualized skeletal structures are unremarkable. IMPRESSION: No active disease. Electronically Signed   By: Donavan Foil M.D.   On: 12/31/2018 21:04   Ct Hip Left Wo Contrast  Result Date: 12/31/2018 CLINICAL DATA:  Acute pain due to trauma EXAM: CT OF THE LEFT HIP WITHOUT CONTRAST TECHNIQUE: Multidetector CT imaging of the left hip was performed according to the standard protocol. Multiplanar CT image reconstructions were also generated. COMPARISON:  X-ray from same day. FINDINGS: Bones/Joint/Cartilage Again noted is an angulated and impacted subcapital/transcervical fracture of the proximal left femur. There is no dislocation. Ligaments Suboptimally assessed by CT. Muscles and Tendons Unremarkable Soft tissues The urinary bladder is significantly distended. The prostate gland is enlarged. IMPRESSION: 1. Acute  fracture of the proximal left femur as detailed above. 2. Significantly distended urinary bladder. Prostatomegaly is noted. Electronically Signed   By: Constance Holster M.D.   On: 12/31/2018 22:29   Dg Hip Unilat W Or W/o  Pelvis 2-3 Views Left  Result Date: 12/31/2018 CLINICAL DATA:  Left hip pain after fall. Tripped over cord in driveway. EXAM: DG HIP (WITH OR WITHOUT PELVIS) 2-3V LEFT COMPARISON:  None. FINDINGS: Minimally displaced left femoral neck fracture with mild angulation. Femoral head remains seated. Pubic rami are intact. Pubic symphysis and sacroiliac joints are congruent. Mild underlying hip osteoarthritis. IMPRESSION: Minimally displaced and angulated left femoral neck fracture. Electronically Signed   By: Keith Rake M.D.   On: 12/31/2018 19:21     Assessment/Plan Principal Problem:   Closed left femoral fracture (HCC) Active Problems:   Type 1 diabetes mellitus (HCC)   Hyperlipidemia   Essential hypertension   HLD (hyperlipidemia)   OSA (obstructive sleep apnea)   CKD (chronic kidney disease) stage 3, GFR 30-59 ml/min (HCC)     #1 left proximal femoral fracture: Patient will be admitted and be prepared for repair.  We will keep n.p.o. after midnight.  Pain management.  Orthopedics involved.  Secondary to mechanical fall.  Disposition after surgery.  Patient is on Plavix that we will hold.  #2 type 1 diabetes: Continue insulin pump.  Patient has tight control of his sugars and would rather use his process.  Sliding scale insulin as necessary.  #3 hypertension: Continue blood pressure medicine from home.  #4 chronic kidney disease stage III: Continue to monitor.  #5 hyperlipidemia: Continue home regimen.  #6 obstructive sleep apnea: CPAP as per home.   DVT prophylaxis: Lovenox Code Status: Full code Family Communication: No family at bed Disposition Plan: Home Consults called: Dr. Stann Mainland orthopedic surgery Admission status: Inpatient  Severity of  Illness: The appropriate patient status for this patient is INPATIENT. Inpatient status is judged to be reasonable and necessary in order to provide the required intensity of service to ensure the patient's safety. The patient's presenting symptoms, physical exam findings, and initial radiographic and laboratory data in the context of their chronic comorbidities is felt to place them at high risk for further clinical deterioration. Furthermore, it is not anticipated that the patient will be medically stable for discharge from the hospital within 2 midnights of admission. The following factors support the patient status of inpatient.   " The patient's presenting symptoms include fall with left hip pain. " The worrisome physical exam findings include shortening and tenderness on left side. " The initial radiographic and laboratory data are worrisome because of evidence of proximal femoral fracture. " The chronic co-morbidities include type 1 diabetes.   * I certify that at the point of admission it is my clinical judgment that the patient will require inpatient hospital care spanning beyond 2 midnights from the point of admission due to high intensity of service, high risk for further deterioration and high frequency of surveillance required.Barbette Merino MD [facility] Pager [phone]  If 7PM-7AM, please contact night-coverage [email or web address] Password [facility]  12/31/2018, 10:33 PM

## 2019-01-01 ENCOUNTER — Inpatient Hospital Stay (HOSPITAL_COMMUNITY): Payer: PPO

## 2019-01-01 ENCOUNTER — Encounter (HOSPITAL_COMMUNITY): Payer: Self-pay | Admitting: *Deleted

## 2019-01-01 ENCOUNTER — Inpatient Hospital Stay (HOSPITAL_COMMUNITY): Payer: PPO | Admitting: Certified Registered"

## 2019-01-01 ENCOUNTER — Encounter (HOSPITAL_COMMUNITY)
Admission: EM | Disposition: A | Discharge status: Home Health | Payer: Self-pay | Source: Home / Self Care | Attending: Internal Medicine

## 2019-01-01 DIAGNOSIS — S72002A Fracture of unspecified part of neck of left femur, initial encounter for closed fracture: Secondary | ICD-10-CM

## 2019-01-01 HISTORY — PX: ANTERIOR APPROACH HEMI HIP ARTHROPLASTY: SHX6690

## 2019-01-01 LAB — COMPREHENSIVE METABOLIC PANEL
ALT: 31 U/L (ref 0–44)
AST: 18 U/L (ref 15–41)
Albumin: 3.2 g/dL — ABNORMAL LOW (ref 3.5–5.0)
Alkaline Phosphatase: 98 U/L (ref 38–126)
Anion gap: 12 (ref 5–15)
BUN: 23 mg/dL — ABNORMAL HIGH (ref 6–20)
CO2: 19 mmol/L — ABNORMAL LOW (ref 22–32)
Calcium: 8.3 mg/dL — ABNORMAL LOW (ref 8.9–10.3)
Chloride: 107 mmol/L (ref 98–111)
Creatinine, Ser: 2.08 mg/dL — ABNORMAL HIGH (ref 0.61–1.24)
GFR calc Af Amer: 41 mL/min — ABNORMAL LOW (ref >60)
GFR calc non Af Amer: 35 mL/min — ABNORMAL LOW (ref >60)
Glucose, Bld: 240 mg/dL — ABNORMAL HIGH (ref 70–99)
Potassium: 4.2 mmol/L (ref 3.5–5.1)
Sodium: 138 mmol/L (ref 135–145)
Total Bilirubin: 1.4 mg/dL — ABNORMAL HIGH (ref 0.3–1.2)
Total Protein: 5.7 g/dL — ABNORMAL LOW (ref 6.5–8.1)

## 2019-01-01 LAB — HIV ANTIBODY (ROUTINE TESTING W REFLEX): HIV Screen 4th Generation wRfx: NONREACTIVE

## 2019-01-01 LAB — CBC
HCT: 33.7 % — ABNORMAL LOW (ref 39.0–52.0)
Hemoglobin: 11.6 g/dL — ABNORMAL LOW (ref 13.0–17.0)
MCH: 30.1 pg (ref 26.0–34.0)
MCHC: 34.4 g/dL (ref 30.0–36.0)
MCV: 87.3 fL (ref 80.0–100.0)
Platelets: 115 10*3/uL — ABNORMAL LOW (ref 150–400)
RBC: 3.86 MIL/uL — ABNORMAL LOW (ref 4.22–5.81)
RDW: 12.7 % (ref 11.5–15.5)
WBC: 10.1 10*3/uL (ref 4.0–10.5)
nRBC: 0 % (ref 0.0–0.2)

## 2019-01-01 LAB — GLUCOSE, CAPILLARY
Glucose-Capillary: 134 mg/dL — ABNORMAL HIGH (ref 70–99)
Glucose-Capillary: 159 mg/dL — ABNORMAL HIGH (ref 70–99)
Glucose-Capillary: 168 mg/dL — ABNORMAL HIGH (ref 70–99)
Glucose-Capillary: 183 mg/dL — ABNORMAL HIGH (ref 70–99)
Glucose-Capillary: 184 mg/dL — ABNORMAL HIGH (ref 70–99)

## 2019-01-01 LAB — SARS CORONAVIRUS 2 (TAT 6-24 HRS): SARS Coronavirus 2: NEGATIVE

## 2019-01-01 LAB — ABO/RH: ABO/RH(D): O POS

## 2019-01-01 LAB — SURGICAL PCR SCREEN
MRSA, PCR: NEGATIVE
Staphylococcus aureus: NEGATIVE

## 2019-01-01 LAB — HEMOGLOBIN A1C
Hgb A1c MFr Bld: 7.2 % — ABNORMAL HIGH (ref 4.8–5.6)
Mean Plasma Glucose: 159.94 mg/dL

## 2019-01-01 SURGERY — HEMIARTHROPLASTY, HIP, DIRECT ANTERIOR APPROACH, FOR FRACTURE
Anesthesia: General | Site: Hip | Laterality: Left

## 2019-01-01 MED ORDER — PHENOL 1.4 % MT LIQD: 1.0000 | OROMUCOSAL | Status: DC | PRN

## 2019-01-01 MED ORDER — OXYCODONE HCL 5 MG/5ML PO SOLN: 5.0000 mg | Freq: Once | ORAL | Status: DC | PRN

## 2019-01-01 MED ORDER — MORPHINE SULFATE (PF) 2 MG/ML IV SOLN: 0.5000 mg | INTRAVENOUS | Status: DC | PRN

## 2019-01-01 MED ORDER — HYDROMORPHONE HCL 1 MG/ML IJ SOLN: 0.2500 mg | INTRAMUSCULAR | Status: DC | PRN

## 2019-01-01 MED ORDER — ESMOLOL HCL 100 MG/10ML IV SOLN
INTRAVENOUS | Status: AC
  Filled 2019-01-01: qty 10

## 2019-01-01 MED ORDER — ACETAMINOPHEN 10 MG/ML IV SOLN
INTRAVENOUS | Status: AC
  Filled 2019-01-01: qty 100

## 2019-01-01 MED ORDER — STERILE WATER FOR IRRIGATION IR SOLN
Status: DC | PRN
  Administered 2019-01-01: 1000 mL

## 2019-01-01 MED ORDER — PROMETHAZINE HCL 25 MG/ML IJ SOLN: 6.2500 mg | INTRAMUSCULAR | Status: DC | PRN

## 2019-01-01 MED ORDER — ONDANSETRON HCL 4 MG PO TABS: 4.0000 mg | ORAL_TABLET | Freq: Four times a day (QID) | ORAL | Status: DC | PRN

## 2019-01-01 MED ORDER — MENTHOL 3 MG MT LOZG: 1.0000 | LOZENGE | OROMUCOSAL | Status: DC | PRN

## 2019-01-01 MED ORDER — CARVEDILOL 25 MG PO TABS
25.0000 mg | ORAL_TABLET | Freq: Two times a day (BID) | ORAL | Status: DC
  Administered 2019-01-01 – 2019-01-04 (×6): 25 mg via ORAL
  Filled 2019-01-01 (×6): qty 1

## 2019-01-01 MED ORDER — DOCUSATE SODIUM 100 MG PO CAPS
100.0000 mg | ORAL_CAPSULE | Freq: Two times a day (BID) | ORAL | Status: DC
  Administered 2019-01-01 – 2019-01-04 (×6): 100 mg via ORAL
  Filled 2019-01-01 (×6): qty 1

## 2019-01-01 MED ORDER — INSULIN ASPART 100 UNIT/ML ~~LOC~~ SOLN: 0.0000 [IU] | Freq: Every day | SUBCUTANEOUS | Status: DC

## 2019-01-01 MED ORDER — MIDAZOLAM HCL 2 MG/2ML IJ SOLN
INTRAMUSCULAR | Status: AC
  Filled 2019-01-01: qty 2

## 2019-01-01 MED ORDER — DEXCOM G6 SENSOR MISC: 3.0000 | Status: DC

## 2019-01-01 MED ORDER — ESMOLOL HCL 100 MG/10ML IV SOLN
INTRAVENOUS | Status: DC | PRN
  Administered 2019-01-01: 20 mg via INTRAVENOUS
  Administered 2019-01-01 (×3): 10 mg via INTRAVENOUS
  Administered 2019-01-01: 50 mg via INTRAVENOUS

## 2019-01-01 MED ORDER — FENTANYL CITRATE (PF) 250 MCG/5ML IJ SOLN
INTRAMUSCULAR | Status: DC | PRN
  Administered 2019-01-01 (×3): 50 ug via INTRAVENOUS
  Administered 2019-01-01: 75 ug via INTRAVENOUS
  Administered 2019-01-01 (×2): 50 ug via INTRAVENOUS
  Administered 2019-01-01: 75 ug via INTRAVENOUS
  Administered 2019-01-01: 50 ug via INTRAVENOUS

## 2019-01-01 MED ORDER — LISINOPRIL 20 MG PO TABS: 40.0000 mg | ORAL_TABLET | Freq: Every day | ORAL | Status: DC

## 2019-01-01 MED ORDER — ASPIRIN 81 MG PO CHEW
81.0000 mg | CHEWABLE_TABLET | Freq: Two times a day (BID) | ORAL | Status: DC
  Administered 2019-01-02 – 2019-01-04 (×5): 81 mg via ORAL
  Filled 2019-01-01 (×5): qty 1

## 2019-01-01 MED ORDER — ONDANSETRON HCL 4 MG/2ML IJ SOLN: 4.0000 mg | Freq: Four times a day (QID) | INTRAMUSCULAR | Status: DC | PRN

## 2019-01-01 MED ORDER — SODIUM CHLORIDE 0.9 % IR SOLN
Status: DC | PRN
  Administered 2019-01-01: 1000 mL
  Administered 2019-01-01: 3000 mL

## 2019-01-01 MED ORDER — HYDROCODONE-ACETAMINOPHEN 5-325 MG PO TABS: 1.0000 | ORAL_TABLET | ORAL | Status: DC | PRN

## 2019-01-01 MED ORDER — LACTATED RINGERS IV SOLN: INTRAVENOUS | Status: DC | PRN

## 2019-01-01 MED ORDER — SODIUM CHLORIDE 0.9 % IV SOLN
INTRAVENOUS | Status: DC
  Administered 2019-01-01: 15:00:00 via INTRAVENOUS

## 2019-01-01 MED ORDER — ACETAMINOPHEN 10 MG/ML IV SOLN
INTRAVENOUS | Status: DC | PRN
  Administered 2019-01-01: 1000 mg via INTRAVENOUS

## 2019-01-01 MED ORDER — OXYCODONE HCL 5 MG PO TABS: 5.0000 mg | ORAL_TABLET | Freq: Once | ORAL | Status: DC | PRN

## 2019-01-01 MED ORDER — LIDOCAINE 2% (20 MG/ML) 5 ML SYRINGE
INTRAMUSCULAR | Status: DC | PRN
  Administered 2019-01-01: 60 mg via INTRAVENOUS

## 2019-01-01 MED ORDER — KETOROLAC TROMETHAMINE 30 MG/ML IJ SOLN
INTRAMUSCULAR | Status: DC | PRN
  Administered 2019-01-01: 30 mg

## 2019-01-01 MED ORDER — PHENYLEPHRINE 40 MCG/ML (10ML) SYRINGE FOR IV PUSH (FOR BLOOD PRESSURE SUPPORT)
PREFILLED_SYRINGE | INTRAVENOUS | Status: DC | PRN
  Administered 2019-01-01: 80 ug via INTRAVENOUS
  Administered 2019-01-01 (×2): 40 ug via INTRAVENOUS
  Administered 2019-01-01: 80 ug via INTRAVENOUS

## 2019-01-01 MED ORDER — CLOPIDOGREL BISULFATE 75 MG PO TABS
75.0000 mg | ORAL_TABLET | Freq: Every day | ORAL | Status: DC
  Administered 2019-01-02 – 2019-01-04 (×3): 75 mg via ORAL
  Filled 2019-01-01 (×3): qty 1

## 2019-01-01 MED ORDER — BUPIVACAINE-EPINEPHRINE (PF) 0.5% -1:200000 IJ SOLN
INTRAMUSCULAR | Status: AC
  Filled 2019-01-01: qty 30

## 2019-01-01 MED ORDER — SODIUM CHLORIDE 0.9 % IV SOLN
INTRAVENOUS | Status: DC
  Administered 2019-01-01: 01:00:00 via INTRAVENOUS

## 2019-01-01 MED ORDER — INSULIN ASPART 100 UNIT/ML ~~LOC~~ SOLN: 0.0000 [IU] | Freq: Three times a day (TID) | SUBCUTANEOUS | Status: DC

## 2019-01-01 MED ORDER — HYDROCODONE-ACETAMINOPHEN 7.5-325 MG PO TABS: 1.0000 | ORAL_TABLET | ORAL | Status: DC | PRN

## 2019-01-01 MED ORDER — ONDANSETRON HCL 4 MG/2ML IJ SOLN
INTRAMUSCULAR | Status: DC | PRN
  Administered 2019-01-01: 4 mg via INTRAVENOUS

## 2019-01-01 MED ORDER — SODIUM CHLORIDE 0.9 % IV SOLN
INTRAVENOUS | Status: DC | PRN
  Administered 2019-01-01: 25 ug/min via INTRAVENOUS

## 2019-01-01 MED ORDER — PROPOFOL 10 MG/ML IV BOLUS
INTRAVENOUS | Status: AC
  Filled 2019-01-01: qty 20

## 2019-01-01 MED ORDER — FENTANYL CITRATE (PF) 250 MCG/5ML IJ SOLN
INTRAMUSCULAR | Status: AC
  Filled 2019-01-01: qty 5

## 2019-01-01 MED ORDER — DEXCOM G6 RECEIVER DEVI: 1.0000 | Status: DC

## 2019-01-01 MED ORDER — CEFAZOLIN SODIUM-DEXTROSE 2-3 GM-%(50ML) IV SOLR
INTRAVENOUS | Status: DC | PRN
  Administered 2019-01-01: 2 g via INTRAVENOUS

## 2019-01-01 MED ORDER — METOCLOPRAMIDE HCL 5 MG PO TABS: 5.0000 mg | ORAL_TABLET | Freq: Three times a day (TID) | ORAL | Status: DC | PRN

## 2019-01-01 MED ORDER — ACETAMINOPHEN 10 MG/ML IV SOLN: 1000.0000 mg | Freq: Once | INTRAVENOUS | Status: DC | PRN

## 2019-01-01 MED ORDER — KETOROLAC TROMETHAMINE 30 MG/ML IJ SOLN
INTRAMUSCULAR | Status: AC
  Filled 2019-01-01: qty 1

## 2019-01-01 MED ORDER — ENSURE PRE-SURGERY PO LIQD
296.0000 mL | Freq: Once | ORAL | Status: DC
  Filled 2019-01-01: qty 296

## 2019-01-01 MED ORDER — DEXCOM G6 TRANSMITTER MISC: 1.0000 | Status: DC

## 2019-01-01 MED ORDER — PANTOPRAZOLE SODIUM 40 MG PO TBEC
40.0000 mg | DELAYED_RELEASE_TABLET | Freq: Every day | ORAL | Status: DC
  Administered 2019-01-01 – 2019-01-04 (×4): 40 mg via ORAL
  Filled 2019-01-01 (×4): qty 1

## 2019-01-01 MED ORDER — MORPHINE SULFATE (PF) 2 MG/ML IV SOLN
2.0000 mg | INTRAVENOUS | Status: DC | PRN
  Administered 2019-01-01 (×2): 2 mg via INTRAVENOUS
  Filled 2019-01-01 (×2): qty 1

## 2019-01-01 MED ORDER — POVIDONE-IODINE 10 % EX SWAB: 2.0000 "application " | Freq: Once | CUTANEOUS | Status: DC

## 2019-01-01 MED ORDER — ACETAMINOPHEN 325 MG PO TABS: 325.0000 mg | ORAL_TABLET | Freq: Four times a day (QID) | ORAL | Status: DC | PRN

## 2019-01-01 MED ORDER — VITAMIN D (ERGOCALCIFEROL) 1.25 MG (50000 UNIT) PO CAPS
50000.0000 [IU] | ORAL_CAPSULE | ORAL | Status: DC
  Administered 2019-01-02: 50000 [IU] via ORAL
  Filled 2019-01-01: qty 1

## 2019-01-01 MED ORDER — ROCURONIUM BROMIDE 10 MG/ML (PF) SYRINGE
PREFILLED_SYRINGE | INTRAVENOUS | Status: DC | PRN
  Administered 2019-01-01 (×2): 15 mg via INTRAVENOUS
  Administered 2019-01-01: 60 mg via INTRAVENOUS

## 2019-01-01 MED ORDER — PROPOFOL 500 MG/50ML IV EMUL
INTRAVENOUS | Status: DC | PRN
  Administered 2019-01-01: 25 ug/kg/min via INTRAVENOUS

## 2019-01-01 MED ORDER — CEFAZOLIN SODIUM-DEXTROSE 2-4 GM/100ML-% IV SOLN
2.0000 g | INTRAVENOUS | Status: DC
  Filled 2019-01-01: qty 100

## 2019-01-01 MED ORDER — SUGAMMADEX SODIUM 200 MG/2ML IV SOLN
INTRAVENOUS | Status: DC | PRN
  Administered 2019-01-01 (×2): 100 mg via INTRAVENOUS

## 2019-01-01 MED ORDER — TRANEXAMIC ACID-NACL 1000-0.7 MG/100ML-% IV SOLN
INTRAVENOUS | Status: AC
  Filled 2019-01-01: qty 100

## 2019-01-01 MED ORDER — INSULIN DETEMIR 100 UNIT/ML ~~LOC~~ SOLN
20.0000 [IU] | Freq: Every day | SUBCUTANEOUS | Status: DC
  Administered 2019-01-01: 14:00:00 20 [IU] via SUBCUTANEOUS
  Filled 2019-01-01 (×2): qty 0.2

## 2019-01-01 MED ORDER — ATORVASTATIN CALCIUM 40 MG PO TABS
40.0000 mg | ORAL_TABLET | Freq: Every day | ORAL | Status: DC
  Administered 2019-01-02 – 2019-01-04 (×3): 40 mg via ORAL
  Filled 2019-01-01 (×3): qty 1

## 2019-01-01 MED ORDER — 0.9 % SODIUM CHLORIDE (POUR BTL) OPTIME
TOPICAL | Status: DC | PRN
  Administered 2019-01-01: 17:00:00 1000 mL

## 2019-01-01 MED ORDER — METOCLOPRAMIDE HCL 5 MG/ML IJ SOLN: 5.0000 mg | Freq: Three times a day (TID) | INTRAMUSCULAR | Status: DC | PRN

## 2019-01-01 MED ORDER — SENNA 8.6 MG PO TABS
1.0000 | ORAL_TABLET | Freq: Two times a day (BID) | ORAL | Status: DC
  Administered 2019-01-01 – 2019-01-04 (×6): 8.6 mg via ORAL
  Filled 2019-01-01 (×6): qty 1

## 2019-01-01 MED ORDER — SODIUM CHLORIDE (PF) 0.9 % IJ SOLN
INTRAMUSCULAR | Status: DC | PRN
  Administered 2019-01-01 (×3): 10 mL

## 2019-01-01 MED ORDER — MECLIZINE HCL 25 MG PO TABS
25.0000 mg | ORAL_TABLET | Freq: Three times a day (TID) | ORAL | Status: DC | PRN
  Filled 2019-01-01: qty 1

## 2019-01-01 MED ORDER — CHLORHEXIDINE GLUCONATE 4 % EX LIQD
60.0000 mL | Freq: Once | CUTANEOUS | Status: AC
  Administered 2019-01-01: 4 via TOPICAL
  Filled 2019-01-01: qty 60

## 2019-01-01 MED ORDER — CEFAZOLIN SODIUM-DEXTROSE 2-4 GM/100ML-% IV SOLN
2.0000 g | Freq: Four times a day (QID) | INTRAVENOUS | Status: AC
  Administered 2019-01-01 – 2019-01-02 (×2): 2 g via INTRAVENOUS
  Filled 2019-01-01 (×2): qty 100

## 2019-01-01 MED ORDER — MIDAZOLAM HCL 5 MG/5ML IJ SOLN
INTRAMUSCULAR | Status: DC | PRN
  Administered 2019-01-01: 2 mg via INTRAVENOUS

## 2019-01-01 MED ORDER — PROPOFOL 10 MG/ML IV BOLUS
INTRAVENOUS | Status: DC | PRN
  Administered 2019-01-01: 20 mg via INTRAVENOUS
  Administered 2019-01-01: 150 mg via INTRAVENOUS

## 2019-01-01 MED ORDER — DEXAMETHASONE SODIUM PHOSPHATE 10 MG/ML IJ SOLN
INTRAMUSCULAR | Status: DC | PRN
  Administered 2019-01-01: 4 mg via INTRAVENOUS

## 2019-01-01 MED ORDER — SODIUM CHLORIDE 0.9 % IV SOLN
INTRAVENOUS | Status: DC | PRN
  Administered 2019-01-01 (×2): via INTRAVENOUS

## 2019-01-01 MED ORDER — AMLODIPINE BESYLATE 10 MG PO TABS
10.0000 mg | ORAL_TABLET | Freq: Every day | ORAL | Status: DC
  Administered 2019-01-02 – 2019-01-04 (×3): 10 mg via ORAL
  Filled 2019-01-01 (×3): qty 1

## 2019-01-01 MED ORDER — BUPIVACAINE-EPINEPHRINE (PF) 0.5% -1:200000 IJ SOLN
INTRAMUSCULAR | Status: DC | PRN
  Administered 2019-01-01: 20 mL via PERINEURAL

## 2019-01-01 MED ORDER — TRANEXAMIC ACID-NACL 1000-0.7 MG/100ML-% IV SOLN
1000.0000 mg | INTRAVENOUS | Status: AC
  Administered 2019-01-01: 16:00:00 1000 mg via INTRAVENOUS

## 2019-01-01 SURGICAL SUPPLY — 66 items
ALCOHOL 70% 16 OZ (MISCELLANEOUS) ×3 IMPLANT
BLADE CLIPPER SURG (BLADE) IMPLANT
CHLORAPREP W/TINT 26 (MISCELLANEOUS) ×3 IMPLANT
COVER SURGICAL LIGHT HANDLE (MISCELLANEOUS) ×3 IMPLANT
COVER WAND RF STERILE (DRAPES) ×3 IMPLANT
DERMABOND ADHESIVE PROPEN (GAUZE/BANDAGES/DRESSINGS) ×2
DERMABOND ADVANCED (GAUZE/BANDAGES/DRESSINGS)
DERMABOND ADVANCED .7 DNX12 (GAUZE/BANDAGES/DRESSINGS) ×2 IMPLANT
DERMABOND ADVANCED .7 DNX6 (GAUZE/BANDAGES/DRESSINGS) IMPLANT
DRAPE C-ARM 42X72 X-RAY (DRAPES) ×3 IMPLANT
DRAPE STERI IOBAN 125X83 (DRAPES) ×3 IMPLANT
DRAPE U-SHAPE 47X51 STRL (DRAPES) ×7 IMPLANT
DRSG AQUACEL AG ADV 3.5X10 (GAUZE/BANDAGES/DRESSINGS) ×1 IMPLANT
DRSG AQUACEL AG ADV 3.5X14 (GAUZE/BANDAGES/DRESSINGS) ×2 IMPLANT
ELECT BLADE 4.0 EZ CLEAN MEGAD (MISCELLANEOUS) ×3
ELECT PENCIL ROCKER SW 15FT (MISCELLANEOUS) ×3 IMPLANT
ELECT REM PT RETURN 9FT ADLT (ELECTROSURGICAL) ×3
ELECTRODE BLDE 4.0 EZ CLN MEGD (MISCELLANEOUS) ×1 IMPLANT
ELECTRODE REM PT RTRN 9FT ADLT (ELECTROSURGICAL) ×1 IMPLANT
EVACUATOR 1/8 PVC DRAIN (DRAIN) IMPLANT
GLOVE BIO SURGEON STRL SZ8.5 (GLOVE) ×8 IMPLANT
GLOVE BIOGEL PI IND STRL 8.5 (GLOVE) ×1 IMPLANT
GLOVE BIOGEL PI INDICATOR 8.5 (GLOVE) ×2
GOWN STRL REUS W/ TWL LRG LVL3 (GOWN DISPOSABLE) ×2 IMPLANT
GOWN STRL REUS W/TWL 2XL LVL3 (GOWN DISPOSABLE) ×3 IMPLANT
GOWN STRL REUS W/TWL LRG LVL3 (GOWN DISPOSABLE) ×8
HANDPIECE INTERPULSE COAX TIP (DISPOSABLE) ×3
HEAD CERAMIC FEMORAL 36MM (Head) ×2 IMPLANT
HOOD PEEL AWAY FACE SHEILD DIS (HOOD) ×6 IMPLANT
INSERT 36MM 0D (Hips) ×2 IMPLANT
JET LAVAGE IRRISEPT WOUND (IRRIGATION / IRRIGATOR) ×3
KIT BASIN OR (CUSTOM PROCEDURE TRAY) ×3 IMPLANT
KIT TURNOVER KIT B (KITS) ×3 IMPLANT
LAVAGE JET IRRISEPT WOUND (IRRIGATION / IRRIGATOR) IMPLANT
MANIFOLD NEPTUNE II (INSTRUMENTS) ×3 IMPLANT
MARKER SKIN DUAL TIP RULER LAB (MISCELLANEOUS) ×6 IMPLANT
NDL SPNL 18GX3.5 QUINCKE PK (NEEDLE) ×1 IMPLANT
NEEDLE 22X1 1/2 (OR ONLY) (NEEDLE) ×2 IMPLANT
NEEDLE SPNL 18GX3.5 QUINCKE PK (NEEDLE) ×3 IMPLANT
NS IRRIG 1000ML POUR BTL (IV SOLUTION) ×3 IMPLANT
PACK TOTAL JOINT (CUSTOM PROCEDURE TRAY) ×3 IMPLANT
PACK UNIVERSAL I (CUSTOM PROCEDURE TRAY) ×3 IMPLANT
PAD ARMBOARD 7.5X6 YLW CONV (MISCELLANEOUS) ×4 IMPLANT
SAW OSC TIP CART 19.5X105X1.3 (SAW) ×3 IMPLANT
SCREW HEX LP 6.5X35 (Screw) ×2 IMPLANT
SEALER BIPOLAR AQUA 6.0 (INSTRUMENTS) ×2 IMPLANT
SET HNDPC FAN SPRY TIP SCT (DISPOSABLE) ×1 IMPLANT
SHELL TRIDENT II CLUST SZ 56MM (Shell) ×2 IMPLANT
SOL PREP POV-IOD 4OZ 10% (MISCELLANEOUS) ×3 IMPLANT
STEM ACCOLADE II SZ6 (Stem) ×2 IMPLANT
SUT ETHIBOND NAB CT1 #1 30IN (SUTURE) ×6 IMPLANT
SUT MNCRL AB 3-0 PS2 18 (SUTURE) ×3 IMPLANT
SUT MON AB 2-0 CT1 36 (SUTURE) ×3 IMPLANT
SUT VIC AB 1 CT1 27 (SUTURE) ×2
SUT VIC AB 1 CT1 27XBRD ANBCTR (SUTURE) ×1 IMPLANT
SUT VIC AB 2-0 CT1 27 (SUTURE) ×2
SUT VIC AB 2-0 CT1 TAPERPNT 27 (SUTURE) ×1 IMPLANT
SUT VLOC 180 0 24IN GS25 (SUTURE) ×3 IMPLANT
SYR 50ML LL SCALE MARK (SYRINGE) ×3 IMPLANT
SYR 5ML LL (SYRINGE) ×2 IMPLANT
TOWEL GREEN STERILE (TOWEL DISPOSABLE) ×3 IMPLANT
TOWEL GREEN STERILE FF (TOWEL DISPOSABLE) ×3 IMPLANT
TRAY CATH 16FR W/PLASTIC CATH (SET/KITS/TRAYS/PACK) IMPLANT
TRAY FOLEY W/BAG SLVR 16FR (SET/KITS/TRAYS/PACK)
TRAY FOLEY W/BAG SLVR 16FR ST (SET/KITS/TRAYS/PACK) IMPLANT
WATER STERILE IRR 1000ML POUR (IV SOLUTION) ×9 IMPLANT

## 2019-01-01 NOTE — Op Note (Signed)
OPERATIVE REPORT  SURGEON: Rod Can, MD   ASSISTANT: Pincus Large, RNFA.  PREOPERATIVE DIAGNOSIS: Displaced Left femoral neck fracture.   POSTOPERATIVE DIAGNOSIS: Displaced Left femoral neck fracture.   PROCEDURE: Left total hip arthroplasty, anterior approach.   IMPLANTS: Stryker Accolade II stem, size 6, 132 degree neck angle. Stryker Trident II Tritanium [employer], size 56 mm. Stryker X3 liner, size 36 mm, F, neutral. Stryker Biolox ceramic V40 head ball, size 36 + 0 mm. 6.5 mm cancellous bone screw x1.  ANESTHESIA:  General  ANTIBIOTICS: g ancef.  ESTIMATED BLOOD LOSS:-350 mL    DRAINS: None.  COMPLICATIONS: None   CONDITION: PACU - hemodynamically stable.   BRIEF CLINICAL NOTE: Adrian Neal is a 53 y.o. male with a displaced Left femoral neck fracture. The patient was admitted to the hospitalist service and underwent perioperative risk stratification and medical optimization. The risks, benefits, and alternatives to total hip arthroplasty were explained, and the patient elected to proceed.  PROCEDURE IN DETAIL: The patient was taken to the operating room and general anesthesia was induced on the hospital bed.  The patient was then positioned on the Hana table.  All bony prominences were well padded.  The hip was prepped and draped in the normal sterile surgical fashion.  A time-out was called verifying side and site of surgery. Antibiotics were given within 60 minutes of beginning the procedure.  The direct anterior approach to the hip was performed through the Hueter interval.  Lateral femoral circumflex vessels were treated with the Auqumantys. The anterior capsule was exposed and an inverted T capsulotomy was made.  Fracture hematoma was encountered and evacuated. The patient was found to have a comminuted Left vertical transcervical femoral neck fracture.  I freshened the femoral neck cut with a saw at the level of the most inferior aspect of the  fracture.  I removed the femoral neck fragment.  A corkscrew was placed into the head and the head was removed.  This was passed to the back table and was measured.   Acetabular exposure was achieved, and the pulvinar and labrum were excised. Sequential reaming of the acetabulum was then performed up to a size 55 mm reamer. A 56 mm cup was then opened and impacted into place at approximately 40 degrees of abduction and 20 degrees of anteversion.  I elected to augment the already acceptable press-fit fixation with a 6.5 mm cancellus bone screw x1.  The final polyethylene liner was impacted into place.    I then gained femoral exposure taking care to protect the abductors and greater trochanter.  This was performed using standard external rotation, extension, and adduction.  The capsule was peeled off the inner aspect of the greater trochanter, taking care to preserve the short external rotators. A cookie cutter was used to enter the femoral canal, and then the femoral canal finder was used to confirm location.  I then sequentially broached up to a size 6.  Calcar planer was used on the femoral neck remnant.  I paced a 132 deg neck and a trial head ball. The hip was reduced.  Leg lengths were checked fluoroscopically.  The hip was dislocated and trial components were removed.  I placed the real stem followed by the real spacer and head ball.  The hip was reduced.  Fluoroscopy was used to confirm component position and leg lengths.  At 90 degrees of external rotation and extension, the hip was stable to an anterior directed force.   The wound  was copiously irrigated with Irrisept solution and normal saline using pule lavage.  Marcaine solution was injected into the periarticular soft tissue.  The wound was closed in layers using #1 Vicryl and V-Loc for the fascia, 2-0 Vicryl for the subcutaneous fat, 2-0 Monocryl for the deep dermal layer, 3-0 running Monocryl subcuticular stitch and glue for the skin.  Once the  glue was fully dried, an Aquacell Ag dressing was applied.  The patient was then awakened from anesthesia and transported to the recovery room in stable condition.  Sponge, needle, and instrument counts were correct at the end of the case x2.  The patient tolerated the procedure well and there were no known complications.  POSTOPERATIVE PLAN: Postoperatively, the patient be readmitted to the hospitalist service.  Weight-bear as tolerated left lower extremity with a walker.  Continue Plavix, and add aspirin 81 mg chewable p.o. twice daily twice a day for DVT prophylaxis.  Mobilize out of bed with physical therapy.  He will undergo disposition planning.  Return to the office for routine two-week postoperative care.

## 2019-01-01 NOTE — Consult Note (Signed)
ORTHOPAEDIC CONSULTATION  REQUESTING PHYSICIAN: Caren Griffins, MD  PCP:  [name], MD  Chief Complaint: Left hip pain  HPI: Adrian Neal is a 53 y.o. male with a history of type 1 diabetes on insulin pump, CKD 3, obstructive sleep apnea on CPAP, hypertension, hyperlipidemia, previous CVA with left sided weakness who tripped and fell in his driveway yesterday.  He landed on the left hip.  He had left hip pain and inability to weight-bear.  He was brought to the emergency department at [facility], where x-rays and CT scan of the left hip were obtained showing a transcervical displaced left femoral neck fracture.  He was admitted to the hospitalist service for perioperative risk stratification and medical optimization.  Orthopedic consultation was placed for management of his left hip fracture.  I was asked to see the patient by my partner Dr. Stann Mainland.  The patient notes that he is on disability for previous stroke with resultant left sided weakness and vertigo.  He walks without any assist device normally.  He lives at home with his wife. Denies other injuries.   Past Medical History:  Diagnosis Date  . Cataract 1994   right eye  . Chronic kidney disease   . Diabetes mellitus without complication ([place])    diagnosed at age 53  . Eye problems   . Heart murmur 1996  . High cholesterol    patient denies but take preventative medicine  . Hypertension   . Retinopathy due to secondary diabetes mellitus ([place])    right  . Sleep apnea    currently not one but looking to get one   . Stroke [facility]) 2016   Past Surgical History:  Procedure Laterality Date  . CATARACT EXTRACTION Right 1994  . CATARACT EXTRACTION W/ INTRAOCULAR LENS IMPLANT  1994  . EYE SURGERY Right 1991   vitrectomy  . EYE SURGERY Right 1992   scar tissue removed from retina   Social History   Socioeconomic History  . Marital status: Married    Spouse name: Not on file  . Number of  children: 3  . Years of education: Not on file  . Highest education level: Not on file  Occupational History  . Occupation: Disabled  Social Needs  . Financial resource strain: Not on file  . Food insecurity    Worry: Not on file    Inability: Not on file  . Transportation needs    Medical: Not on file    Non-medical: Not on file  Tobacco Use  . Smoking status: Former Smoker    Packs/day: 1.00    Years: 6.00    Pack years: 6.00    Quit date: 04/30/1991    Years since quitting: 27.6  . Smokeless tobacco: Never Used  Substance and Sexual Activity  . Alcohol use: No  . Drug use: Never  . Sexual activity: Not on file  Lifestyle  . Physical activity    Days per week: Not on file    Minutes per session: Not on file  . Stress: Not on file  Relationships  . Social Herbalist on phone: Not on file    Gets together: Not on file    Attends religious service: Not on file    Active member of club or organization: Not on file    Attends meetings of clubs or organizations: Not on file    Relationship status: Not on file  Other Topics Concern  .  Not on file  Social History Narrative  . Not on file   Family History  Problem Relation Age of Onset  . Hypertension Mother   . Hyperlipidemia Mother   . Hyperlipidemia Father   . Hypertension Father   . Diabetes Father   . Kidney disease Father        had a kidney transplant   . Stroke Brother   . Diabetes Brother   . Pulmonary fibrosis Paternal Grandmother   . Lung cancer Paternal Grandfather   . Diabetes Daughter   . Colon cancer Neg Hx   . Esophageal cancer Neg Hx   . Rectal cancer Neg Hx   . Stomach cancer Neg Hx    No Known Allergies Prior to Admission medications   Medication Sig Start Date End Date Taking? Authorizing Provider  amLODipine (NORVASC) 10 MG tablet Take 1 tablet (10 mg total) by mouth daily. 10/27/18  Yes [name], MD  atorvastatin (LIPITOR) 40 MG tablet Take 1 tablet (40 mg total) by  mouth daily at 6 PM. Patient taking differently: Take 40 mg by mouth daily.  10/27/18  Yes [name], MD  carvedilol (COREG) 25 MG tablet Take 1 tablet (25 mg total) by mouth 2 (two) times daily with a meal. 10/27/18  Yes [name], MD  clopidogrel (PLAVIX) 75 MG tablet Take 1 tablet (75 mg total) by mouth daily. Aspirin and Plavix for 3 months, after that-stop Aspirin-take Plavix alone Patient taking differently: Take 75 mg by mouth daily.  10/27/18  Yes [name], MD  insulin lispro (HUMALOG) 100 UNIT/ML injection Max daily dose of 100 units via pump DX E10.59 (vials) 11/10/18  Yes Philemon Kingdom, MD  lisinopril (ZESTRIL) 40 MG tablet Take 1 tablet (40 mg total) by mouth daily. 10/27/18  Yes [name], MD  meclizine (ANTIVERT) 25 MG tablet TAKE 1 TABLET BY MOUTH THREE TIMES DAILY AS NEEDED FOR DIZZINESS Patient taking differently: Take 25 mg by mouth 3 (three) times daily as needed for dizziness.  10/19/18  Yes [name], MD  pantoprazole (PROTONIX) 40 MG tablet Take 1 tablet (40 mg total) by mouth daily. 07/29/18  Yes [name], MD  Vitamin D, Ergocalciferol, (DRISDOL) 1.25 MG (50000 UT) CAPS capsule Take 1 capsule (50,000 Units total) by mouth every 7 (seven) days. 10/29/18  Yes [name], MD  Continuous Blood Gluc Receiver (DEXCOM G6 RECEIVER) DEVI 1 Device by Does not apply route as directed. 08/12/18   Shamleffer, Melanie Crazier, MD  Continuous Blood Gluc Sensor (DEXCOM G6 SENSOR) MISC 3 Devices by Does not apply route as directed. 08/12/18   Shamleffer, Melanie Crazier, MD  Continuous Blood Gluc Transmit (DEXCOM G6 TRANSMITTER) MISC 1 Device by Does not apply route as directed. 08/12/18   Shamleffer, Melanie Crazier, MD   Dg Chest 1 View  Result Date: 12/31/2018 CLINICAL DATA:  Fall with chest pain EXAM: CHEST  1 VIEW COMPARISON:  01/19/2015 FINDINGS: The heart size and mediastinal contours are within normal  limits. Both lungs are clear. The visualized skeletal structures are unremarkable. IMPRESSION: No active disease. Electronically Signed   By: Donavan Foil M.D.   On: 12/31/2018 21:04   Ct Hip Left Wo Contrast  Result Date: 12/31/2018 CLINICAL DATA:  Acute pain due to trauma EXAM: CT OF THE LEFT HIP WITHOUT CONTRAST TECHNIQUE: Multidetector CT imaging of the left hip was performed according to the standard protocol. Multiplanar CT image reconstructions were also generated. COMPARISON:  X-ray from same day. FINDINGS: Bones/Joint/Cartilage Again noted is an angulated and impacted subcapital/transcervical fracture of the proximal left femur. There is no dislocation. Ligaments Suboptimally assessed by CT. Muscles and Tendons Unremarkable Soft tissues The urinary bladder is significantly distended. The prostate gland is enlarged. IMPRESSION: 1. Acute fracture of the proximal left femur as detailed above. 2. Significantly distended urinary bladder. Prostatomegaly is noted. Electronically Signed   By: Constance Holster M.D.   On: 12/31/2018 22:29   Dg Knee Left Port  Result Date: 01/01/2019 CLINICAL DATA:  Femoral neck fracture. Preop. EXAM: PORTABLE LEFT KNEE - 1-2 VIEW COMPARISON:  None. FINDINGS: No evidence of fracture, dislocation, or joint effusion. Mild patellofemoral spurring. Minimal medial tibiofemoral joint space narrowing. There is chondrocalcinosis. Soft tissues are unremarkable. IMPRESSION: 1. No acute findings.  Mild degenerative change. 2. Chondrocalcinosis. Electronically Signed   By: Keith Rake M.D.   On: 01/01/2019 00:15   Dg Hip Unilat W Or W/o Pelvis 2-3 Views Left  Result Date: 12/31/2018 CLINICAL DATA:  Left hip pain after fall. Tripped over cord in driveway. EXAM: DG HIP (WITH OR WITHOUT PELVIS) 2-3V LEFT COMPARISON:  None. FINDINGS: Minimally displaced left femoral neck fracture with mild angulation. Femoral head remains seated. Pubic rami are intact. Pubic symphysis and sacroiliac  joints are congruent. Mild underlying hip osteoarthritis. IMPRESSION: Minimally displaced and angulated left femoral neck fracture. Electronically Signed   By: Keith Rake M.D.   On: 12/31/2018 19:21    Positive ROS: All other systems have been reviewed and were otherwise negative with the exception of those mentioned in the HPI and as above.  Physical Exam: General: Alert, no acute distress Cardiovascular: No pedal edema Respiratory: No cyanosis, no use of accessory musculature GI: No organomegaly, abdomen is soft and non-tender Skin: No lesions in the area of chief complaint Neurologic: Sensation intact distally Psychiatric: Patient is competent for consent with normal mood and affect Lymphatic: No axillary or cervical lymphadenopathy  MUSCULOSKELETAL: Examination of the left hip reveals no skin wounds or lesions.  He is shortened and externally rotated.  He has pain with attempted logrolling of the hip.  He has palpable pedal pulses.  He has positive motor function dorsiflexion, plantarflexion, and great toe extension with strength limited by hip pain.  He reports intact sensation.  Assessment: Displaced left femoral neck fracture. Type 1 diabetes, A1c 7.2. CKD 3. History of CVA with left-sided weakness.  Plan: I discussed the findings with the patient.  We discussed that the most durable, long-lasting solution for his injury is a left total hip replacement.  We discussed the risk, benefits, and alternatives.  Please see statement of risk.  Plan for surgery today.  All questions were solicited and answered.  The risks, benefits, and alternatives were discussed with the patient. There are risks associated with the surgery including, but not limited to, problems with anesthesia (death), infection, instability (giving out of the joint), dislocation, differences in leg length/angulation/rotation, fracture of bones, loosening or failure of implants, hematoma (blood accumulation) which  may require surgical drainage, blood clots, pulmonary embolism, nerve injury (foot drop and lateral thigh numbness), and blood vessel injury. The patient understands these risks and elects to proceed.    Bertram Savin, MD Cell [phone]    01/01/2019 3:37 PM

## 2019-01-01 NOTE — Anesthesia Preprocedure Evaluation (Addendum)
Anesthesia Evaluation  Patient identified by MRN, date of birth, ID band Patient awake    Reviewed: Allergy & Precautions, NPO status , Patient's Chart, lab work & pertinent test results  Airway Mallampati: III  TM Distance: >3 FB Neck ROM: Full    Dental  (+) Chipped,    Pulmonary sleep apnea and Continuous Positive Airway Pressure Ventilation , former smoker,    Pulmonary exam normal breath sounds clear to auscultation       Cardiovascular hypertension, Pt. on medications and Pt. on home beta blockers Normal cardiovascular exam Rhythm:Regular Rate:Normal  ECG: ST, rate 108  ECHO: Left ventricle: The cavity size was normal. Wall thickness was increased in a pattern of mild LVH. Systolic function was normal. The estimated ejection fraction was in the range of 60% to 65%. Wall motion was normal; there were no regional wall motion abnormalities. Features are consistent with a pseudonormal left ventricular filling pattern, with concomitant abnormal relaxation and increased filling pressure (grade 2 diastolic dysfunction).    Neuro/Psych CVA, Residual Symptoms negative psych ROS   GI/Hepatic negative GI ROS, Neg liver ROS,   Endo/Other  diabetes, Insulin Dependent  Renal/GU CRFRenal disease     Musculoskeletal Ambulates with cane at times    Abdominal (+) + obese,   Peds  Hematology  (+) anemia , HLD Thrombocytopenia   Anesthesia Other Findings left hip fracture  Reproductive/Obstetrics                            Anesthesia Physical Anesthesia Plan  ASA: III  Anesthesia Plan: General   Post-op Pain Management:    Induction: Intravenous  PONV Risk Score and Plan: 3 and Ondansetron, Dexamethasone, Midazolam and Treatment may vary due to age or medical condition  Airway Management Planned: Oral ETT  Additional Equipment:   Intra-op Plan:   Post-operative Plan: Extubation in  OR  Informed Consent: I have reviewed the patients History and Physical, chart, labs and discussed the procedure including the risks, benefits and alternatives for the proposed anesthesia with the patient or authorized representative who has indicated his/her understanding and acceptance.     Dental advisory given  Plan Discussed with: CRNA  Anesthesia Plan Comments:        Anesthesia Quick Evaluation

## 2019-01-01 NOTE — Progress Notes (Signed)
PROGRESS NOTE  Adrian Neal SWN:[id number] DOB: 1965/12/02 DOA: 12/31/2018 PCP: [name], MD   LOS: 1 day   Brief Narrative / Interim history: 53 year old male with type 1 diabetes mellitus on insulin pump, CKD stage III, diabetic nephropathy/retinopathy, OSA, obesity who came to the hospital and was admitted on 12/31/2018 after having a fall at home and found to have left femoral neck fracture.  Orthopedic surgery will operate on 9/4  Subjective: Awaiting surgery, complains of left leg/hip pain.  Assessment & Plan: Principal Problem:   Closed left femoral fracture (HCC) Active Problems:   Type 1 diabetes mellitus (HCC)   Hyperlipidemia   Essential hypertension   HLD (hyperlipidemia)   OSA (obstructive sleep apnea)   CKD (chronic kidney disease) stage 3, GFR 30-59 ml/min (HCC)   Principal Problem Left proximal femoral fracture -N.p.o. this morning, orthopedic surgery to operate this afternoon.  Active Problems Type 1 diabetes mellitus -Insulin pump lost discharge this morning and stopped delivering insulin.  Will transition to Levemir, and postop in a day or 2 will put him back on the pump  Chronic kidney disease stage III -Baseline creatinine 1.8-1.9.  Currently close to baseline  OSA -Continue CPAP  Hypertension/hyperlipidemia -continue home medications   Scheduled Meds: . amLODipine  10 mg Oral Daily  . atorvastatin  40 mg Oral Daily  . carvedilol  25 mg Oral BID WC  . clopidogrel  75 mg Oral Daily  . feeding supplement  296 mL Oral Once  . insulin aspart  0-5 Units Subcutaneous QHS  . insulin aspart  0-9 Units Subcutaneous TID WC  . insulin detemir  20 Units Subcutaneous Daily  . lisinopril  40 mg Oral Daily  . pantoprazole  40 mg Oral Daily  . povidone-iodine  2 application Topical Once  . povidone-iodine  2 application Topical Once  . [START ON 01/02/2019] Vitamin D (Ergocalciferol)  50,000 Units Oral Q Sat   Continuous Infusions: . sodium  chloride 100 mL/hr at 01/01/19 0036  .  ceFAZolin (ANCEF) IV    . tranexamic acid     PRN Meds:.meclizine, morphine injection, ondansetron **OR** ondansetron (ZOFRAN) IV  DVT prophylaxis: per ortho Code Status: Full code Family Communication: d/w patient  Disposition Plan: TBD  Consultants:   Orthopedic surgery   Procedures:   None   Antimicrobials:  None   Objective: Vitals:   12/31/18 2300 12/31/18 2346 01/01/19 0433 01/01/19 0850  BP: 139/68 (!) 162/68 (!) 148/79 (!) 143/62  Pulse: (!) 105 (!) 107 (!) 106 97  Resp: 13 16 14 18   Temp:  99.1 F (37.3 C) 98.9 F (37.2 C) 98.5 F (36.9 C)  TempSrc:  Oral Oral Oral  SpO2: 96% 97% 92% 92%  Weight:      Height:        Intake/Output Summary (Last 24 hours) at 01/01/2019 1235 Last data filed at 01/01/2019 0900 Gross per 24 hour  Intake 1339.56 ml  Output 2400 ml  Net -1060.44 ml   Filed Weights   12/31/18 1804  Weight: 90.7 kg    Examination:  Constitutional: NAD Eyes: PERRL, lids and conjunctivae normal ENMT: Mucous membranes are moist.  Respiratory: clear to auscultation bilaterally, no wheezing, no crackles. Cardiovascular: Regular rate and rhythm, no murmurs / rubs / gallops.  Abdomen: no tenderness. Bowel sounds positive.  Musculoskeletal: no clubbing / cyanosis.  Skin: no rashes Neurologic: CN 2-12 grossly intact. Strength 5/5 in all 4.  Psychiatric: Normal judgment and insight. Alert and oriented  x 3. Normal mood.    Data Reviewed: I have independently reviewed following labs and imaging studies   CBC: Recent Labs  Lab 12/31/18 2050 01/01/19 0434  WBC 11.7* 10.1  HGB 13.3 11.6*  HCT 39.7 33.7*  MCV 88.2 87.3  PLT 130* 353*   Basic Metabolic Panel: Recent Labs  Lab 12/31/18 2050 01/01/19 0434  NA 142 138  K 3.8 4.2  CL 108 107  CO2 22 19*  GLUCOSE 122* 240*  BUN 24* 23*  CREATININE 2.14* 2.08*  CALCIUM 8.9 8.3*   GFR: Estimated Creatinine Clearance: 44.9 mL/min (A) (by C-G  formula based on SCr of 2.08 mg/dL (H)). Liver Function Tests: Recent Labs  Lab 12/31/18 2050 01/01/19 0434  AST 26 18  ALT 40 31  ALKPHOS 117 98  BILITOT 0.9 1.4*  PROT 6.6 5.7*  ALBUMIN 3.9 3.2*   No results for input(s): LIPASE, AMYLASE in the last 168 hours. No results for input(s): AMMONIA in the last 168 hours. Coagulation Profile: Recent Labs  Lab 12/31/18 2050  INR 1.1   Cardiac Enzymes: No results for input(s): CKTOTAL, CKMB, CKMBINDEX, TROPONINI in the last 168 hours. BNP (last 3 results) No results for input(s): PROBNP in the last 8760 hours. HbA1C: Recent Labs    01/01/19 0434  HGBA1C 7.2*   CBG: Recent Labs  Lab 01/01/19 0032  GLUCAP 134*   Lipid Profile: No results for input(s): CHOL, HDL, LDLCALC, TRIG, CHOLHDL, LDLDIRECT in the last 72 hours. Thyroid Function Tests: No results for input(s): TSH, T4TOTAL, FREET4, T3FREE, THYROIDAB in the last 72 hours. Anemia Panel: No results for input(s): VITAMINB12, FOLATE, FERRITIN, TIBC, IRON, RETICCTPCT in the last 72 hours. Urine analysis:    Component Value Date/Time   COLORURINE YELLOW 10/28/2018 0839   APPEARANCEUR CLEAR 10/28/2018 0839   LABSPEC 1.015 10/28/2018 0839   PHURINE 5.5 10/28/2018 0839   GLUCOSEU NEGATIVE 10/28/2018 0839   HGBUR NEGATIVE 10/28/2018 0839   BILIRUBINUR NEGATIVE 10/28/2018 [place] 10/28/2018 0839   PROTEINUR 30 (A) 01/19/2015 2013   UROBILINOGEN 0.2 10/28/2018 0839   NITRITE NEGATIVE 10/28/2018 0839   LEUKOCYTESUR NEGATIVE 10/28/2018 0839   Sepsis Labs: Invalid input(s): PROCALCITONIN, LACTICIDVEN  Recent Results (from the past 240 hour(s))  SARS CORONAVIRUS 2 (TAT 6-24 HRS) Nasopharyngeal Nasopharyngeal Swab     Status: None   Collection Time: 12/31/18  9:07 PM   Specimen: Nasopharyngeal Swab  Result Value Ref Range Status   SARS Coronavirus 2 NEGATIVE NEGATIVE Final    Comment: (NOTE) SARS-CoV-2 target nucleic acids are NOT DETECTED. The  SARS-CoV-2 RNA is generally detectable in upper and lower respiratory specimens during the acute phase of infection. Negative results do not preclude SARS-CoV-2 infection, do not rule out co-infections with other pathogens, and should not be used as the sole basis for treatment or other patient management decisions. Negative results must be combined with clinical observations, patient history, and epidemiological information. The expected result is Negative. Fact Sheet for Patients: SugarRoll.be Fact Sheet for Healthcare Providers: https://www.woods-mathews.com/ This test is not yet approved or cleared by the [place] FDA and  has been authorized for detection and/or diagnosis of SARS-CoV-2 by FDA under an Emergency Use Authorization (EUA). This EUA will remain  in effect (meaning this test can be used) for the duration of the COVID-19 declaration under Section 56 4(b)(1) of the Act, 21 U.S.C. section 360bbb-3(b)(1), unless the authorization is terminated or revoked sooner. Performed at [facility], [place]  [address], [place], [place] [address]   Surgical pcr screen     Status: None   Collection Time: 01/01/19 12:49 AM   Specimen: Nasal Mucosa; Nasal Swab  Result Value Ref Range Status   MRSA, PCR NEGATIVE NEGATIVE Final   Staphylococcus aureus NEGATIVE NEGATIVE Final    Comment: (NOTE) The Xpert SA Assay (FDA approved for NASAL specimens in patients 57 years of age and older), is one component of a comprehensive surveillance program. It is not intended to diagnose infection nor to guide or monitor treatment. Performed at [facility], [place] [address], [place], [place] [address]       Radiology Studies: Dg Chest 1 View  Result Date: 12/31/2018 CLINICAL DATA:  Fall with chest pain EXAM: CHEST  1 VIEW COMPARISON:  01/19/2015 FINDINGS: The heart size and mediastinal contours are within normal limits. Both lungs are clear.  The visualized skeletal structures are unremarkable. IMPRESSION: No active disease. Electronically Signed   By: Donavan Foil M.D.   On: 12/31/2018 21:04   Ct Hip Left Wo Contrast  Result Date: 12/31/2018 CLINICAL DATA:  Acute pain due to trauma EXAM: CT OF THE LEFT HIP WITHOUT CONTRAST TECHNIQUE: Multidetector CT imaging of the left hip was performed according to the standard protocol. Multiplanar CT image reconstructions were also generated. COMPARISON:  X-ray from same day. FINDINGS: Bones/Joint/Cartilage Again noted is an angulated and impacted subcapital/transcervical fracture of the proximal left femur. There is no dislocation. Ligaments Suboptimally assessed by CT. Muscles and Tendons Unremarkable Soft tissues The urinary bladder is significantly distended. The prostate gland is enlarged. IMPRESSION: 1. Acute fracture of the proximal left femur as detailed above. 2. Significantly distended urinary bladder. Prostatomegaly is noted. Electronically Signed   By: Constance Holster M.D.   On: 12/31/2018 22:29   Dg Knee Left Port  Result Date: 01/01/2019 CLINICAL DATA:  Femoral neck fracture. Preop. EXAM: PORTABLE LEFT KNEE - 1-2 VIEW COMPARISON:  None. FINDINGS: No evidence of fracture, dislocation, or joint effusion. Mild patellofemoral spurring. Minimal medial tibiofemoral joint space narrowing. There is chondrocalcinosis. Soft tissues are unremarkable. IMPRESSION: 1. No acute findings.  Mild degenerative change. 2. Chondrocalcinosis. Electronically Signed   By: Keith Rake M.D.   On: 01/01/2019 00:15   Dg Hip Unilat W Or W/o Pelvis 2-3 Views Left  Result Date: 12/31/2018 CLINICAL DATA:  Left hip pain after fall. Tripped over cord in driveway. EXAM: DG HIP (WITH OR WITHOUT PELVIS) 2-3V LEFT COMPARISON:  None. FINDINGS: Minimally displaced left femoral neck fracture with mild angulation. Femoral head remains seated. Pubic rami are intact. Pubic symphysis and sacroiliac joints are congruent. Mild  underlying hip osteoarthritis. IMPRESSION: Minimally displaced and angulated left femoral neck fracture. Electronically Signed   By: Keith Rake M.D.   On: 12/31/2018 19:21    Marzetta Board, MD, PhD [facility]  Contact via  [email or web address]  [place] P: [phone] F: [phone]

## 2019-01-01 NOTE — Progress Notes (Signed)
Patient arrived to short stay surgical with diabetic freestyle sensor on operative limb. Patient removed sensor. RN placed in zip lock bag and attached to chart. Dineen Kid, RN (patient's floor RN) notified of sensor in chart.

## 2019-01-01 NOTE — Anesthesia Postprocedure Evaluation (Signed)
Anesthesia Post Note  Patient: Adrian Neal  Procedure(s) Performed: ANTERIOR APPROACH total hip ARTHROPLASTY (Left Hip)     Patient location during evaluation: PACU Anesthesia Type: General Level of consciousness: awake and alert Pain management: pain level controlled Vital Signs Assessment: post-procedure vital signs reviewed and stable Respiratory status: spontaneous breathing, nonlabored ventilation, respiratory function stable and patient connected to nasal cannula oxygen Cardiovascular status: blood pressure returned to baseline and stable Postop Assessment: no apparent nausea or vomiting Anesthetic complications: no    Last Vitals:  Vitals:   01/01/19 1930 01/01/19 2002  BP: 130/64 (!) 141/64  Pulse: 81 85  Resp: 12 14  Temp:  37 C  SpO2: 94% 92%    Last Pain:  Vitals:   01/01/19 2002  TempSrc: Axillary  PainSc:                  Raynold Blankenbaker,W. EDMOND

## 2019-01-01 NOTE — Anesthesia Procedure Notes (Signed)
Procedure Name: Intubation Date/Time: 01/01/2019 3:54 PM Performed by: Jearld Pies, CRNA Pre-anesthesia Checklist: Patient identified, Emergency Drugs available, Suction available and Patient being monitored Patient Re-evaluated:Patient Re-evaluated prior to induction Oxygen Delivery Method: Circle System Utilized Preoxygenation: Pre-oxygenation with 100% oxygen Induction Type: IV induction Ventilation: Mask ventilation without difficulty and Oral airway inserted - appropriate to patient size Laryngoscope Size: Glidescope and 4 Grade View: Grade I Tube type: Oral Tube size: 7.5 mm Number of attempts: 2 Airway Equipment and Method: Stylet and Oral airway Placement Confirmation: ETT inserted through vocal cords under direct vision,  positive ETCO2 and breath sounds checked- equal and bilateral Secured at: 23 cm Tube secured with: Tape Dental Injury: Teeth and Oropharynx as per pre-operative assessment  Difficulty Due To: Difficulty was anticipated and Difficult Airway- due to limited oral opening Comments: DL x 1 with Grade 3 view with MAC 4, removed laryngoscope, mask ventilated with oral airway, Glidescope #4 utilized, G1v, +intubation

## 2019-01-01 NOTE — Transfer of Care (Signed)
Immediate Anesthesia Transfer of Care Note  Patient: Adrian Neal  Procedure(s) Performed: ANTERIOR APPROACH total hip ARTHROPLASTY (Left Hip)  Patient Location: PACU  Anesthesia Type:General  Level of Consciousness: awake, alert  and oriented  Airway & Oxygen Therapy: Patient Spontanous Breathing and Patient connected to face mask oxygen  Post-op Assessment: Report given to RN and Post -op Vital signs reviewed and stable  Post vital signs: Reviewed and stable  Last Vitals:  Vitals Value Taken Time  BP 141/80 01/01/19 1844  Temp    Pulse 84 01/01/19 1853  Resp 10 01/01/19 1853  SpO2 100 % 01/01/19 1853  Vitals shown include unvalidated device data.  Last Pain:  Vitals:   01/01/19 1845  TempSrc:   PainSc: (P) 0-No pain         Complications: No apparent anesthesia complications

## 2019-01-01 NOTE — Progress Notes (Signed)
Inpatient Diabetes Program Recommendations  AACE/ADA: New Consensus Statement on Inpatient Glycemic Control (2015)  Target Ranges:  Prepandial:   less than 140 mg/dL      Peak postprandial:   less than 180 mg/dL (1-2 hours)      Critically ill patients:  140 - 180 mg/dL   Lab Results  Component Value Date   GLUCAP 134 (H) 01/01/2019   HGBA1C 7.2 (H) 01/01/2019    Review of Glycemic Control Results for Adrian Neal, Adrian Neal (MRN [id number]) as of 01/01/2019 12:18  Ref. Range 01/01/2019 04:34  Glucose Latest Ref Range: 70 - 99 mg/dL 240 (H)   Diabetes history: Type 1 DM (requires basal/bolus) Outpatient Diabetes medications: Humalog insulin via T-Slim Current orders for Inpatient glycemic control: Novolog 0-9 units TID, Novolog 0-5 units QHS  Inpatient Diabetes Program Recommendations:     -Consider adding Levemir 20 units now, then QD to follow.  -Once diet is resumed following procedure, consider Novolog 5 units TID (assuming patient is consuming >50% of meal).    Noted in documentation patient is wearing insulin pump. However, insulin pump order set not placed. Called RN and verified patient is wearing insulin pump, however, quit delivering insulin because of lack of charging. Noted AM correction has not been given. Per RN last CBG was 235 mg/dL, encouraged to re dock meter.  Patient is followed by Dr Kelton Pillar, endocrinology. Last visit was on 12/17/2018 and patient has been using T-Slim.  Spoke with patient to verify dose settings and that insulin pump was no longer functioning. Patient reports that he feels it quit delivering insulin around an hour ago. Wife is able to bring charger for device and phone this afternoon.    Current insulin pump settings are as follows:  Basal insulin  0000-0000 1.0 units/hour Total daily basal insulin: 24 units/24 hours  Carb Coverage 1:9 1 unit for every 9 grams of carbohydrates  Insulin Sensitivity 1:25 1 unit drops blood glucose 25  mg/dl  Target Glucose Goals 0000-0000 110 mg/dl   In talking with the patient he states that his blood glucose normally runs very good and his last A1C was 7.2%.  Patient agreeable to using SQ insulin and is preparing for surgery this afternoon at 1500. Discussed plan that includes removing insulin pump for surgery and using SubQ insulin. Also, encouraged his wife to bring insulin pump supplies and could potentially reapply tomorrow following surgery, if appropriate. Sent secure chat to Dr Cruzita Lederer regarding recommendations and plan. Orders placed.  Reviewed new orders with RN to ensure administration of basal insulin.  Patient verbalized understanding of information discussed and states that he does not have any further questions related to diabetes at this time.  NURSING: Once insulin pump order set is ordered please print off the Patient insulin pump contract and flow sheet. The insulin pump contract should be signed by the patient and then placed in the chart. The patient insulin pump flow sheet will be completed by the patient at the bedside and the RN caring for the patient will use the patient's flow sheet to document in the [facility]. RN will need to complete the Nursing Insulin Pump Flowsheet at least once a shift. Patient will need to keep extra insulin pump supplies at the bedside at all times.   Thanks, Bronson Curb, MSN, RNC-OB Diabetes Coordinator [phone] (8a-5p)

## 2019-01-02 DIAGNOSIS — R739 Hyperglycemia, unspecified: Secondary | ICD-10-CM

## 2019-01-02 LAB — CBC
HCT: 27.2 % — ABNORMAL LOW (ref 39.0–52.0)
Hemoglobin: 8.9 g/dL — ABNORMAL LOW (ref 13.0–17.0)
MCH: 29.2 pg (ref 26.0–34.0)
MCHC: 32.7 g/dL (ref 30.0–36.0)
MCV: 89.2 fL (ref 80.0–100.0)
Platelets: 110 10*3/uL — ABNORMAL LOW (ref 150–400)
RBC: 3.05 MIL/uL — ABNORMAL LOW (ref 4.22–5.81)
RDW: 12.7 % (ref 11.5–15.5)
WBC: 11.9 10*3/uL — ABNORMAL HIGH (ref 4.0–10.5)
nRBC: 0 % (ref 0.0–0.2)

## 2019-01-02 LAB — BASIC METABOLIC PANEL
Anion gap: 10 (ref 5–15)
Anion gap: 9 (ref 5–15)
BUN: 32 mg/dL — ABNORMAL HIGH (ref 6–20)
BUN: 53 mg/dL — ABNORMAL HIGH (ref 6–20)
CO2: 17 mmol/L — ABNORMAL LOW (ref 22–32)
CO2: 17 mmol/L — ABNORMAL LOW (ref 22–32)
Calcium: 8 mg/dL — ABNORMAL LOW (ref 8.9–10.3)
Calcium: 7.8 mg/dL — ABNORMAL LOW (ref 8.9–10.3)
Chloride: 111 mmol/L (ref 98–111)
Chloride: 101 mmol/L (ref 98–111)
Creatinine, Ser: 2.41 mg/dL — ABNORMAL HIGH (ref 0.61–1.24)
Creatinine, Ser: 2.69 mg/dL — ABNORMAL HIGH (ref 0.61–1.24)
GFR calc Af Amer: 34 mL/min — ABNORMAL LOW (ref >60)
GFR calc Af Amer: 30 mL/min — ABNORMAL LOW (ref >60)
GFR calc non Af Amer: 30 mL/min — ABNORMAL LOW (ref >60)
GFR calc non Af Amer: 26 mL/min — ABNORMAL LOW (ref >60)
Glucose, Bld: 300 mg/dL — ABNORMAL HIGH (ref 70–99)
Glucose, Bld: 615 mg/dL (ref 70–99)
Potassium: 4.3 mmol/L (ref 3.5–5.1)
Potassium: 4.2 mmol/L (ref 3.5–5.1)
Sodium: 138 mmol/L (ref 135–145)
Sodium: 127 mmol/L — ABNORMAL LOW (ref 135–145)

## 2019-01-02 LAB — GLUCOSE, CAPILLARY
Glucose-Capillary: 317 mg/dL — ABNORMAL HIGH (ref 70–99)
Glucose-Capillary: 457 mg/dL — ABNORMAL HIGH (ref 70–99)
Glucose-Capillary: 589 mg/dL (ref 70–99)
Glucose-Capillary: 596 mg/dL (ref 70–99)
Glucose-Capillary: >600 mg/dL (ref 70–99)
Glucose-Capillary: >600 mg/dL (ref 70–99)
Glucose-Capillary: 461 mg/dL — ABNORMAL HIGH (ref 70–99)
Glucose-Capillary: 423 mg/dL — ABNORMAL HIGH (ref 70–99)

## 2019-01-02 MED ORDER — INSULIN ASPART 100 UNIT/ML ~~LOC~~ SOLN
0.0000 [IU] | Freq: Three times a day (TID) | SUBCUTANEOUS | Status: DC
  Administered 2019-01-02: 9 [IU] via SUBCUTANEOUS
  Administered 2019-01-03: 5 [IU] via SUBCUTANEOUS

## 2019-01-02 MED ORDER — INSULIN ASPART 100 UNIT/ML ~~LOC~~ SOLN
10.0000 [IU] | Freq: Once | SUBCUTANEOUS | Status: AC
  Administered 2019-01-02: 10 [IU] via SUBCUTANEOUS

## 2019-01-02 MED ORDER — TAMSULOSIN HCL 0.4 MG PO CAPS
0.4000 mg | ORAL_CAPSULE | Freq: Every day | ORAL | Status: DC
  Administered 2019-01-02 – 2019-01-04 (×3): 0.4 mg via ORAL
  Filled 2019-01-02 (×3): qty 1

## 2019-01-02 MED ORDER — HYDROCODONE-ACETAMINOPHEN 5-325 MG PO TABS: 1.0000 | ORAL_TABLET | ORAL | 0 refills | Status: DC | PRN

## 2019-01-02 MED ORDER — INSULIN ASPART 100 UNIT/ML ~~LOC~~ SOLN: 2.0000 [IU] | Freq: Three times a day (TID) | SUBCUTANEOUS | Status: DC

## 2019-01-02 MED ORDER — INSULIN DETEMIR 100 UNIT/ML ~~LOC~~ SOLN
24.0000 [IU] | Freq: Every day | SUBCUTANEOUS | Status: DC
  Administered 2019-01-02 – 2019-01-03 (×2): 24 [IU] via SUBCUTANEOUS
  Filled 2019-01-02 (×2): qty 0.24

## 2019-01-02 MED ORDER — SODIUM CHLORIDE 0.9 % IV BOLUS
500.0000 mL | Freq: Once | INTRAVENOUS | Status: AC
  Administered 2019-01-02: 500 mL via INTRAVENOUS

## 2019-01-02 MED ORDER — INSULIN PUMP
Freq: Three times a day (TID) | SUBCUTANEOUS | Status: DC
  Filled 2019-01-02: qty 1

## 2019-01-02 MED ORDER — INSULIN ASPART 100 UNIT/ML ~~LOC~~ SOLN
8.0000 [IU] | Freq: Once | SUBCUTANEOUS | Status: AC
  Administered 2019-01-02: 8 [IU] via SUBCUTANEOUS

## 2019-01-02 MED ORDER — ASPIRIN 81 MG PO CHEW: 81.0000 mg | CHEWABLE_TABLET | Freq: Two times a day (BID) | ORAL | 0 refills | Status: AC

## 2019-01-02 MED ORDER — INSULIN ASPART 100 UNIT/ML ~~LOC~~ SOLN
15.0000 [IU] | Freq: Once | SUBCUTANEOUS | Status: AC
  Administered 2019-01-02: 15 [IU] via SUBCUTANEOUS

## 2019-01-02 MED ORDER — INSULIN ASPART 100 UNIT/ML ~~LOC~~ SOLN: 5.0000 [IU] | Freq: Once | SUBCUTANEOUS | Status: DC

## 2019-01-02 MED ORDER — INSULIN ASPART 100 UNIT/ML ~~LOC~~ SOLN: 0.0000 [IU] | Freq: Every day | SUBCUTANEOUS | Status: DC

## 2019-01-02 NOTE — Evaluation (Signed)
Occupational Therapy Evaluation Patient Details Name: Adrian Neal MRN: [id number] DOB: 1965-05-04 Today's Date: 01/02/2019    History of Present Illness Pt is a 53 y/o male with PMH of DM type 1, CKD 3, sleep apnea, HTN, CVA with L sided weakness, R eye cataract and retinopathy, admitted after a fall.  Found with L displaced femoral neck fx, s/p total hip arthroplasy, anterior approach 01/01/19.   Clinical Impression   PTA patient independent and driving. Admitted for above and limited by problem list below, including impaired balance, L hip pain and decreased activity tolerance.  Educated on [place], movement precautions, safety, and recommendations.  He currently requires min guard for transfers using RW, min-mod assist for LB ADLs and setup for UB ADLs. He lives at home with his spouse, who can provide 24/7 support as needed.  Patient will benefit from continued OT services while admitted, but anticipate he will progress well with no further needs required after dc.     Follow Up Recommendations  No OT follow up;Supervision - Intermittent    Equipment Recommendations  None recommended by OT    Recommendations for Other Services PT consult     Precautions / Restrictions Precautions Precautions: Anterior Hip;Fall Restrictions Weight Bearing Restrictions: Yes LLE Weight Bearing: Weight bearing as tolerated      Mobility Bed Mobility Overal bed mobility: Needs Assistance Bed Mobility: Supine to Sit     Supine to sit: Min guard;HOB elevated     General bed mobility comments: cueing for technique, min guard for safety, increased time required but no physical assist   Transfers Overall transfer level: Needs assistance Equipment used: Rolling walker (2 wheeled) Transfers: Sit to/from Stand Sit to Stand: Min guard         General transfer comment: min guard for safety/balance, increased time using RW but no physical assist required    Balance Overall balance assessment:  Needs assistance Sitting-balance support: No upper extremity supported;Feet supported Sitting balance-Leahy Scale: Fair     Standing balance support: No upper extremity supported;Bilateral upper extremity supported;During functional activity Standing balance-Leahy Scale: Fair Standing balance comment: relaint on B UE support dynamically but able to static stand during ADLs without UE support                           ADL either performed or assessed with clinical judgement   ADL Overall ADL's : Needs assistance/impaired     Grooming: Set up;Sitting   Upper Body Bathing: Set up;Sitting   Lower Body Bathing: Sit to/from stand;Minimal assistance Lower Body Bathing Details (indicate cue type and reason): decreased reach to B LEs  Upper Body Dressing : Set up;Sitting   Lower Body Dressing: Moderate assistance;Sit to/from stand Lower Body Dressing Details (indicate cue type and reason): decreased reach, requires assist to thread underwear and manage socks; able to pull over hips with min guard  Toilet Transfer: Min Fish farm manager Details (indicate cue type and reason): simulated to recliner  Toileting- Clothing Manipulation and Hygiene: Min guard;Sit to/from stand       Functional mobility during ADLs: Surveyor, minerals     Praxis      Pertinent Vitals/Pain Pain Assessment: Faces Faces Pain Scale: Hurts little more Pain Location: L hip Pain Descriptors / Indicators: Discomfort;Operative site guarding;Sore Pain Intervention(s): Monitored during session;Repositioned     Hand Dominance Right  Extremity/Trunk Assessment Upper Extremity Assessment Upper Extremity Assessment: Overall WFL for tasks assessed   Lower Extremity Assessment Lower Extremity Assessment: Defer to PT evaluation(s/p L total hip arthroplasty )   Cervical / Trunk Assessment Cervical / Trunk Assessment: Normal   Communication  Communication Communication: No difficulties   Cognition Arousal/Alertness: Awake/alert Behavior During Therapy: WFL for tasks assessed/performed Overall Cognitive Status: Within Functional Limits for tasks assessed                                     General Comments       Exercises     Shoulder Instructions      Home Living Family/patient expects to be discharged to:: Private residence Living Arrangements: Spouse/significant other Available Help at Discharge: Family;Available 5 hours/day(11 y/o son) Type of Home: House Home Access: Stairs to enter Technical brewer of Steps: 1 step  Entrance Stairs-Rails: None Home Layout: Two level;Bed/bath upstairs Alternate Level Stairs-Number of Steps: 14 Alternate Level Stairs-Rails: Right;Left Bathroom Shower/Tub: Teacher, early years/pre: Handicapped height     Home Equipment: Cane - single point          Prior Functioning/Environment Level of Independence: Independent with assistive device(s)        Comments: uses cane in community, driving; not working         OT Problem List: Decreased strength;Decreased activity tolerance;Impaired balance (sitting and/or standing);Decreased knowledge of use of DME or AE;Decreased knowledge of precautions;Pain      OT Treatment/Interventions: Self-care/ADL training;DME and/or AE instruction;Therapeutic activities;Patient/family education;Balance training    OT Goals(Current goals can be found in the care plan section) Acute Rehab OT Goals Patient Stated Goal: to get back home and move better OT Goal Formulation: With patient Time For Goal Achievement: 01/16/19 Potential to Achieve Goals: Good  OT Frequency: Min 2X/week   Barriers to D/C:            Co-evaluation              AM-PAC OT "6 Clicks" Daily Activity     Outcome Measure Help from another person eating meals?: None Help from another person taking care of personal grooming?: A  Little Help from another person toileting, which includes using toliet, bedpan, or urinal?: A Little Help from another person bathing (including washing, rinsing, drying)?: A Little Help from another person to put on and taking off regular upper body clothing?: None Help from another person to put on and taking off regular lower body clothing?: A Little 6 Click Score: 20   End of Session Equipment Utilized During Treatment: Gait belt;Rolling walker Nurse Communication: Mobility status  Activity Tolerance: Patient tolerated treatment well Patient left: in chair;with call bell/phone within reach;with chair alarm set  OT Visit Diagnosis: Other abnormalities of gait and mobility (R26.89);Pain Pain - Right/Left: Left Pain - part of body: Hip                Time: 0825-0849 OT Time Calculation (min): 24 min Charges:  OT General Charges $OT Visit: 1 Visit OT Evaluation $OT Eval Moderate Complexity: 1 Mod OT Treatments $Self Care/Home Management : 8-22 mins  Delight Stare, OT Acute Rehabilitation Services Pager [phone] Office [phone]   Delight Stare 01/02/2019, 9:51 AM

## 2019-01-02 NOTE — Plan of Care (Signed)

## 2019-01-02 NOTE — Evaluation (Signed)
Physical Therapy Evaluation Patient Details Name: Adrian Neal MRN: [id number] DOB: [birth year] Today's Date: 01/02/2019   History of Present Illness  Pt is a 53 y/o male with PMH of DM type 1, CKD 3, sleep apnea, HTN, CVA with L sided weakness, R eye cataract and retinopathy, admitted after a fall.  Found with L displaced femoral neck fx, s/p total hip arthroplasy, anterior approach 01/01/19.  Clinical Impression  Patient presents with mild deficits in gait and mobility due to recent fall and subsequent surgery.  Patient reports no history of falls prior to this one.  Anticipate patient will make steady gains and be ready for discharge in 1-2 days from mobility standpoint.      Follow Up Recommendations Follow surgeon's recommendation for DC plan and follow-up therapies    Equipment Recommendations  Rolling walker with 5" wheels    Recommendations for Other Services       Precautions / Restrictions Precautions Precautions: None Restrictions LLE Weight Bearing: Weight bearing as tolerated      Mobility  Bed Mobility               General bed mobility comments: up in chair upon arrival  Transfers Overall transfer level: Needs assistance Equipment used: Rolling walker (2 wheeled) Transfers: Sit to/from Stand Sit to Stand: Supervision         General transfer comment: increased time stand to sit  Ambulation/Gait Ambulation/Gait assistance: Supervision Gait Distance (Feet): 80 Feet Assistive device: Rolling walker (2 wheeled) Gait Pattern/deviations: Step-to pattern Gait velocity: decreased      Stairs            Wheelchair Mobility    Modified Rankin (Stroke Patients Only)       Balance Overall balance assessment: Needs assistance Sitting-balance support: No upper extremity supported;Feet supported Sitting balance-Leahy Scale: Good     Standing balance support: No upper extremity supported;Bilateral upper extremity supported;During functional  activity Standing balance-Leahy Scale: Poor Standing balance comment: reliant on RW for balance                             Pertinent Vitals/Pain Pain Assessment: No/denies pain    Home Living Family/patient expects to be discharged to:: Private residence Living Arrangements: Spouse/significant other Available Help at Discharge: Family;Available 76 hours/day(11 y/o son) Type of Home: House Home Access: Stairs to enter Entrance Stairs-Rails: None Entrance Stairs-Number of Steps: 1 step  Home Layout: Two level;Bed/bath upstairs Home Equipment: Cane - single point      Prior Function Level of Independence: Independent with assistive device(s)         Comments: uses cane in community, driving; not working      Journalist, newspaper   Dominant Hand: Right    Extremity/Trunk Assessment        Lower Extremity Assessment Lower Extremity Assessment: Generalized weakness       Communication   Communication: No difficulties  Cognition Arousal/Alertness: Awake/alert Behavior During Therapy: WFL for tasks assessed/performed Overall Cognitive Status: Within Functional Limits for tasks assessed                                        General Comments      Exercises     Assessment/Plan    PT Assessment Patient needs continued PT services  PT Problem List Decreased activity tolerance;Decreased balance;Decreased mobility;Decreased knowledge  of use of DME       PT Treatment Interventions DME instruction;Gait training;Stair training;Functional mobility training;Therapeutic activities;Therapeutic exercise;Patient/family education    PT Goals (Current goals can be found in the Care Plan section)  Acute Rehab PT Goals Patient Stated Goal: to get back home and move better PT Goal Formulation: With patient Time For Goal Achievement: 01/09/19 Potential to Achieve Goals: Good    Frequency Min 5X/week   Barriers to discharge        Co-evaluation                AM-PAC PT "6 Clicks" Mobility  Outcome Measure Help needed turning from your back to your side while in a flat bed without using bedrails?: A Little Help needed moving from lying on your back to sitting on the side of a flat bed without using bedrails?: A Little Help needed moving to and from a bed to a chair (including a wheelchair)?: A Little Help needed standing up from a chair using your arms (e.g., wheelchair or bedside chair)?: A Little Help needed to walk in hospital room?: A Little Help needed climbing 3-5 steps with a railing? : A Little 6 Click Score: 18    End of Session   Activity Tolerance: Patient tolerated treatment well Patient left: in chair;with call bell/phone within reach;with chair alarm set   PT Visit Diagnosis: Unsteadiness on feet (R26.81);Muscle weakness (generalized) (M62.81)    Time: 1202-1224 PT Time Calculation (min) (ACUTE ONLY): 22 min   Charges:   PT Evaluation $PT Eval Moderate Complexity: 1 Mod          01/02/2019 [place], PT Acute Rehabilitation Services Pager:  [phone] Office:  [phone]    Shanna Cisco 01/02/2019, 12:58 PM

## 2019-01-02 NOTE — Progress Notes (Signed)
Subjective: 1 Day Post-Op Procedure(s) (LRB): ANTERIOR APPROACH total hip ARTHROPLASTY (Left) Patient reports pain as mild.   Patient seen in rounds for Dr. Lyla Glassing. Patient is well, and has had no acute complaints or problems other than discomfort in the left hip. No acute events overnight. Patient resting comfortably in the recliner with family at bedside. Patient states he slept well last night. He is voiding without difficulty, positive flatus. Denies nausea or vomiting.  We will continue therapy today.   Objective: Vital signs in last 24 hours: Temp:  [98.3 F (36.8 C)-99.9 F (37.7 C)] 99.4 F (37.4 C) (09/05 1500) Pulse Rate:  [81-106] 97 (09/05 1500) Resp:  [11-18] 18 (09/05 1500) BP: (129-161)/(55-80) 140/55 (09/05 1500) SpO2:  [91 %-97 %] 92 % (09/05 1500)  Intake/Output from previous day:  Intake/Output Summary (Last 24 hours) at 01/02/2019 1534 Last data filed at 01/02/2019 1010 Gross per 24 hour  Intake 3455.36 ml  Output 1526 ml  Net 1929.36 ml     Intake/Output this shift: Total I/O In: 114 [P.O.:114] Out: 1 [Urine:1]  Labs: Recent Labs    12/31/18 2050 01/01/19 0434 01/02/19 0415  HGB 13.3 11.6* 8.9*   Recent Labs    01/01/19 0434 01/02/19 0415  WBC 10.1 11.9*  RBC 3.86* 3.05*  HCT 33.7* 27.2*  PLT 115* 110*   Recent Labs    01/01/19 0434 01/02/19 0415  NA 138 138  K 4.2 4.3  CL 107 111  CO2 19* 17*  BUN 23* 32*  CREATININE 2.08* 2.41*  GLUCOSE 240* 300*  CALCIUM 8.3* 8.0*   Recent Labs    12/31/18 2050  INR 1.1    Exam: General - Patient is Alert and Oriented Extremity - Neurologically intact Sensation intact distally Intact pulses distally Dorsiflexion/Plantar flexion intact Dressing - dressing C/D/I Motor Function - intact, moving foot and toes well on exam.   Past Medical History:  Diagnosis Date  . Cataract 1994   right eye  . Chronic kidney disease   . Diabetes mellitus without complication ([place])    diagnosed  at age 9  . Eye problems   . Heart murmur 1996  . High cholesterol    patient denies but take preventative medicine  . Hypertension   . 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 Sleep apnea    currently not one but looking to get one   . Stroke ([place]) 2016    Assessment/Plan: 1 Day Post-Op Procedure(s) (LRB): ANTERIOR APPROACH total hip ARTHROPLASTY (Left) Principal Problem:   Closed left femoral fracture (HCC) Active Problems:   Type 1 diabetes mellitus (HCC)   Hyperlipidemia   Essential hypertension   HLD (hyperlipidemia)   OSA (obstructive sleep apnea)   CKD (chronic kidney disease) stage 3, GFR 30-59 ml/min (HCC)  Estimated body mass index is 30.4 kg/m as calculated from the following:   Height as of this encounter: 5\' 8"  (1.727 m).   Weight as of this encounter: 90.7 kg. Advance diet Up with therapy  DVT Prophylaxis - Aspirin Weight bearing as tolerated. D/C O2 and pulse ox and try on room air.  Hemoglobin stable at 8.9. Plan is to go Home after hospital stay. From an orthopedic standpoint, patient is ready for discharge today or tomorrow as long as he is meeting goals with physical therapy. Discharge when ready per medicine. We discussed expectations and aftercare today. Aspirin and Norco sent to his pharmacy. He will follow up with Dr.  Swinteck in the office in 2 weeks.  Griffith Citron, PA-C Orthopedic Surgery 01/02/2019, 3:34 PM

## 2019-01-02 NOTE — Plan of Care (Signed)
  Problem: Activity: Goal: Risk for activity intolerance will decrease Outcome: Progressing   Problem: Pain Managment: Goal: General experience of comfort will improve Outcome: Progressing   

## 2019-01-02 NOTE — Progress Notes (Addendum)
PROGRESS NOTE  Adrian Neal HKV:[id number] DOB: 04-04-1966 DOA: 12/31/2018 PCP: [name], MD   LOS: 2 days   Brief Narrative / Interim history: 53 year old male with type 1 diabetes mellitus on insulin pump, CKD stage III, diabetic nephropathy/retinopathy, OSA, obesity who came to the hospital and was admitted on 12/31/2018 after having a fall at home and found to have left femoral neck fracture.  Orthopedic surgery will operate on 9/4  Subjective: -no complaints this morning, no pain. Underwent THA yesterday.  No fever or chills, no shortness of breath.  Assessment & Plan: Principal Problem:   Closed left femoral fracture (HCC) Active Problems:   Type 1 diabetes mellitus (HCC)   Hyperlipidemia   Essential hypertension   HLD (hyperlipidemia)   OSA (obstructive sleep apnea)   CKD (chronic kidney disease) stage 3, GFR 30-59 ml/min (HCC)   Principal Problem Left proximal femoral fracture -Orthopedic surgery consulted and followed patient while hospitalized.  He underwent total hip arthroplasty on 01/01/2019.  PT evaluation pending -DVT prophylaxis per orthopedic surgery  Active Problems Type 1 diabetes mellitus -patient's insulin pump loss discharged on 9/4 and he was transitioned to Levemir and sliding scale.  He is able to get the supplies and pump discharge this morning.  Fasting CBG is 317.  Will allow patient to reattach his insulin pump, he will correct administration based on this morning CBG.  Discontinue Levemir  Acute kidney injury on chronic kidney disease stage III -Baseline creatinine 1.8-1.9, currently worsening with a creatinine of 2.4 this morning -This is likely in the setting of acute urinary obstruction, he had a Foley yesterday afternoon however the Foley has been discontinued this morning and patient has not voided yet.  We will continue to closely monitor, and will bladder scan around noon. -Hold lisinopril for now  Acute urinary obstruction  /history of BPH -CT scan on admission showed large volume of urine in the bladder as well as enlarged prostate.  Patient is aware that his prostate is enlarged based on an exam in his PCPs office -Voiding trial today, Foley has been removed this morning however given worsening creatinine I have very low threshold to add back to Foley -Add Flomax today and give some fluids  OSA -Continue CPAP  Hypertension/hyperlipidemia -continue home medications   Scheduled Meds: . amLODipine  10 mg Oral Daily  . aspirin  81 mg Oral BID WC  . atorvastatin  40 mg Oral Daily  . carvedilol  25 mg Oral BID WC  . clopidogrel  75 mg Oral Daily  . docusate sodium  100 mg Oral BID  . insulin aspart  5 Units Subcutaneous Once  . insulin pump   Subcutaneous TID AC, HS, 0200  . pantoprazole  40 mg Oral Daily  . senna  1 tablet Oral BID  . tamsulosin  0.4 mg Oral QPC breakfast  . Vitamin D (Ergocalciferol)  50,000 Units Oral Q Sat   Continuous Infusions: . sodium chloride 100 mL/hr at 01/01/19 2121   PRN Meds:.acetaminophen, HYDROcodone-acetaminophen, HYDROcodone-acetaminophen, meclizine, menthol-cetylpyridinium **OR** phenol, metoCLOPramide **OR** metoCLOPramide (REGLAN) injection, morphine injection, morphine injection, ondansetron **OR** ondansetron (ZOFRAN) IV  DVT prophylaxis: per ortho Code Status: Full code Family Communication: d/w patient  Disposition Plan: TBD  Consultants:   Orthopedic surgery   Procedures:   Left total hip arthroplasty by Dr. Lyla Glassing on 01/01/2019  Antimicrobials:  None   Objective: Vitals:   01/01/19 2002 01/01/19 2338 01/02/19 0506 01/02/19 0747  BP: (!) 141/64 (!) 141/66 129/62 Marland Kitchen)  152/67  Pulse: 85 94 (!) 106 (!) 105  Resp: 14   16  Temp: 98.6 F (37 C) 98.7 F (37.1 C) 98.4 F (36.9 C) 99.9 F (37.7 C)  TempSrc: Axillary Axillary Oral Oral  SpO2: 92% 95% 93% 94%  Weight:      Height:        Intake/Output Summary (Last 24 hours) at 01/02/2019 1130  Last data filed at 01/02/2019 1010 Gross per 24 hour  Intake 3455.36 ml  Output 2226 ml  Net 1229.36 ml   Filed Weights   12/31/18 1804 01/01/19 1439  Weight: 90.7 kg 90.7 kg    Examination:  Constitutional: No distress, sitting in bed Eyes: No scleral icterus ENMT: Moist mucous membranes Respiratory: Clear to auscultation bilaterally, no wheezing or crackles heard Cardiovascular: Regular rate and rhythm, no murmurs appreciated.  No peripheral edema Abdomen: Soft, nontender, nondistended, positive bowel sounds Musculoskeletal: no clubbing / cyanosis.  Skin: No rashes seen Neurologic: No focal deficits, equal strength Psychiatric: Normal judgment and insight. Alert and oriented x 3. Normal mood.    Data Reviewed: I have independently reviewed following labs and imaging studies   CBC: Recent Labs  Lab 12/31/18 2050 01/01/19 0434 01/02/19 0415  WBC 11.7* 10.1 11.9*  HGB 13.3 11.6* 8.9*  HCT 39.7 33.7* 27.2*  MCV 88.2 87.3 89.2  PLT 130* 115* 161*   Basic Metabolic Panel: Recent Labs  Lab 12/31/18 2050 01/01/19 0434 01/02/19 0415  NA 142 138 138  K 3.8 4.2 4.3  CL 108 107 111  CO2 22 19* 17*  GLUCOSE 122* 240* 300*  BUN 24* 23* 32*  CREATININE 2.14* 2.08* 2.41*  CALCIUM 8.9 8.3* 8.0*   GFR: Estimated Creatinine Clearance: 38.8 mL/min (A) (by C-G formula based on SCr of 2.41 mg/dL (H)). Liver Function Tests: Recent Labs  Lab 12/31/18 2050 01/01/19 0434  AST 26 18  ALT 40 31  ALKPHOS 117 98  BILITOT 0.9 1.4*  PROT 6.6 5.7*  ALBUMIN 3.9 3.2*   No results for input(s): LIPASE, AMYLASE in the last 168 hours. No results for input(s): AMMONIA in the last 168 hours. Coagulation Profile: Recent Labs  Lab 12/31/18 2050  INR 1.1   Cardiac Enzymes: No results for input(s): CKTOTAL, CKMB, CKMBINDEX, TROPONINI in the last 168 hours. BNP (last 3 results) No results for input(s): PROBNP in the last 8760 hours. HbA1C: Recent Labs    01/01/19 0434  HGBA1C  7.2*   CBG: Recent Labs  Lab 01/01/19 1344 01/01/19 1632 01/01/19 1845 01/01/19 2059 01/02/19 0616  GLUCAP 183* 159* 168* 184* 317*   Lipid Profile: No results for input(s): CHOL, HDL, LDLCALC, TRIG, CHOLHDL, LDLDIRECT in the last 72 hours. Thyroid Function Tests: No results for input(s): TSH, T4TOTAL, FREET4, T3FREE, THYROIDAB in the last 72 hours. Anemia Panel: No results for input(s): VITAMINB12, FOLATE, FERRITIN, TIBC, IRON, RETICCTPCT in the last 72 hours. Urine analysis:    Component Value Date/Time   COLORURINE YELLOW 10/28/2018 0839   APPEARANCEUR CLEAR 10/28/2018 0839   LABSPEC 1.015 10/28/2018 0839   PHURINE 5.5 10/28/2018 0839   GLUCOSEU NEGATIVE 10/28/2018 0839   HGBUR NEGATIVE 10/28/2018 0839   BILIRUBINUR NEGATIVE 10/28/2018 [place] 10/28/2018 0839   PROTEINUR 30 (A) 01/19/2015 2013   UROBILINOGEN 0.2 10/28/2018 0839   NITRITE NEGATIVE 10/28/2018 0839   LEUKOCYTESUR NEGATIVE 10/28/2018 0839   Sepsis Labs: Invalid input(s): PROCALCITONIN, LACTICIDVEN  Recent Results (from the past 240 hour(s))  SARS CORONAVIRUS 2 (TAT  6-24 HRS) Nasopharyngeal Nasopharyngeal Swab     Status: None   Collection Time: 12/31/18  9:07 PM   Specimen: Nasopharyngeal Swab  Result Value Ref Range Status   SARS Coronavirus 2 NEGATIVE NEGATIVE Final    Comment: (NOTE) SARS-CoV-2 target nucleic acids are NOT DETECTED. The SARS-CoV-2 RNA is generally detectable in upper and lower respiratory specimens during the acute phase of infection. Negative results do not preclude SARS-CoV-2 infection, do not rule out co-infections with other pathogens, and should not be used as the sole basis for treatment or other patient management decisions. Negative results must be combined with clinical observations, patient history, and epidemiological information. The expected result is Negative. Fact Sheet for Patients: SugarRoll.be Fact Sheet for  Healthcare Providers: https://www.woods-mathews.com/ This test is not yet approved or cleared by the [place] FDA and  has been authorized for detection and/or diagnosis of SARS-CoV-2 by FDA under an Emergency Use Authorization (EUA). This EUA will remain  in effect (meaning this test can be used) for the duration of the COVID-19 declaration under Section 56 4(b)(1) of the Act, 21 U.S.C. section 360bbb-3(b)(1), unless the authorization is terminated or revoked sooner. Performed at [facility], [place] [address], [place], [place] [address]   Surgical pcr screen     Status: None   Collection Time: 01/01/19 12:49 AM   Specimen: Nasal Mucosa; Nasal Swab  Result Value Ref Range Status   MRSA, PCR NEGATIVE NEGATIVE Final   Staphylococcus aureus NEGATIVE NEGATIVE Final    Comment: (NOTE) The Xpert SA Assay (FDA approved for NASAL specimens in patients 19 years of age and older), is one component of a comprehensive surveillance program. It is not intended to diagnose infection nor to guide or monitor treatment. Performed at [facility], [place] [address], [place], [place] [address]       Radiology Studies: Dg Chest 1 View  Result Date: 12/31/2018 CLINICAL DATA:  Fall with chest pain EXAM: CHEST  1 VIEW COMPARISON:  01/19/2015 FINDINGS: The heart size and mediastinal contours are within normal limits. Both lungs are clear. The visualized skeletal structures are unremarkable. IMPRESSION: No active disease. Electronically Signed   By: Donavan Foil M.D.   On: 12/31/2018 21:04   Pelvis Portable  Result Date: 01/01/2019 CLINICAL DATA:  Status post total left hip replacement EXAM: PORTABLE PELVIS 1-2 VIEWS COMPARISON:  January 01, 2019 FINDINGS: The patient is status post left total hip arthroplasty. No periprosthetic lucency or fracture is identified. Overlying soft tissue swelling subcutaneous emphysema. IMPRESSION: Status post left total hip arthroplasty without  complication. Electronically Signed   By: Prudencio Pair M.D.   On: 01/01/2019 19:36   Ct Hip Left Wo Contrast  Result Date: 12/31/2018 CLINICAL DATA:  Acute pain due to trauma EXAM: CT OF THE LEFT HIP WITHOUT CONTRAST TECHNIQUE: Multidetector CT imaging of the left hip was performed according to the standard protocol. Multiplanar CT image reconstructions were also generated. COMPARISON:  X-ray from same day. FINDINGS: Bones/Joint/Cartilage Again noted is an angulated and impacted subcapital/transcervical fracture of the proximal left femur. There is no dislocation. Ligaments Suboptimally assessed by CT. Muscles and Tendons Unremarkable Soft tissues The urinary bladder is significantly distended. The prostate gland is enlarged. IMPRESSION: 1. Acute fracture of the proximal left femur as detailed above. 2. Significantly distended urinary bladder. Prostatomegaly is noted. Electronically Signed   By: Constance Holster M.D.   On: 12/31/2018 22:29   Dg Knee Left Port  Result Date: 01/01/2019 CLINICAL  DATA:  Femoral neck fracture. Preop. EXAM: PORTABLE LEFT KNEE - 1-2 VIEW COMPARISON:  None. FINDINGS: No evidence of fracture, dislocation, or joint effusion. Mild patellofemoral spurring. Minimal medial tibiofemoral joint space narrowing. There is chondrocalcinosis. Soft tissues are unremarkable. IMPRESSION: 1. No acute findings.  Mild degenerative change. 2. Chondrocalcinosis. Electronically Signed   By: Keith Rake M.D.   On: 01/01/2019 00:15   Dg C-arm 1-60 Min  Result Date: 01/01/2019 CLINICAL DATA:  Left hip replacement EXAM: OPERATIVE left HIP (WITH PELVIS IF PERFORMED) 2 VIEWS TECHNIQUE: Fluoroscopic spot image(s) were submitted for interpretation post-operatively. COMPARISON:  12/31/2018 FINDINGS: Two low resolution intraoperative spot views of the left hip. Total fluoroscopy time was 26 seconds. The images demonstrate a left hip replacement with normal alignment IMPRESSION: Intraoperative fluoroscopic  assistance provided during left hip replacement surgery Electronically Signed   By: Donavan Foil M.D.   On: 01/01/2019 18:59   Dg Hip Operative Unilat W Or W/o Pelvis Left  Result Date: 01/01/2019 CLINICAL DATA:  Left hip replacement EXAM: OPERATIVE left HIP (WITH PELVIS IF PERFORMED) 2 VIEWS TECHNIQUE: Fluoroscopic spot image(s) were submitted for interpretation post-operatively. COMPARISON:  12/31/2018 FINDINGS: Two low resolution intraoperative spot views of the left hip. Total fluoroscopy time was 26 seconds. The images demonstrate a left hip replacement with normal alignment IMPRESSION: Intraoperative fluoroscopic assistance provided during left hip replacement surgery Electronically Signed   By: Donavan Foil M.D.   On: 01/01/2019 18:59   Dg Hip Unilat W Or W/o Pelvis 2-3 Views Left  Result Date: 12/31/2018 CLINICAL DATA:  Left hip pain after fall. Tripped over cord in driveway. EXAM: DG HIP (WITH OR WITHOUT PELVIS) 2-3V LEFT COMPARISON:  None. FINDINGS: Minimally displaced left femoral neck fracture with mild angulation. Femoral head remains seated. Pubic rami are intact. Pubic symphysis and sacroiliac joints are congruent. Mild underlying hip osteoarthritis. IMPRESSION: Minimally displaced and angulated left femoral neck fracture. Electronically Signed   By: Keith Rake M.D.   On: 12/31/2018 19:21    Marzetta Board, MD, PhD [facility]  Contact via  [email or web address]  [place] P: [phone] F: [phone]

## 2019-01-02 NOTE — Progress Notes (Signed)
CRITICAL VALUE ALERT  Critical Value:  Blood glucose 615  Date & Time Notied:  01/02/2019 @ 1958  Provider Notified: [facility]   Orders Received/Actions taken: Pending

## 2019-01-02 NOTE — Progress Notes (Signed)
Notified Dr Silas [place] of pt's CBG is 423 now. Will continue to monitor.

## 2019-01-02 NOTE — Progress Notes (Signed)
Patient has had progressive elevation of his CBGs throughout the day.  His fasting sugars this morning was 300.  He resumed his insulin pump at that time, resumed his basal rate of 1 unit/h along with bolusing as per pump instructions.  Despite that, his CBGs have progressively increased throughout the day and towards the evening they are in the upper 500s.  Patient's pump is new, a month old, already had some problems with the charger and insulin delivery and he is not sure whether there was some malfunction going on today.  On outpatient no chart review it appears that he is using an insulin sensitivity of 25.  With current CBGs, he will be given 17 units of fast acting insulin which should bring his CBGs down roughly by at least 400.  He will also be given 24 units of Levemir to match his basal rate.  We will closely monitor CBGs, he is at risk of DKA, if he has persistent elevated CBGs may be best to place him on glucose stabilizer overnight.  Adrian Neal M. Cruzita Lederer, MD, PhD [facility]  Contact via  [email or web address]  [place] P: [phone] F: [phone]

## 2019-01-02 NOTE — Progress Notes (Signed)
Results for NASIIR, MONTS (MRN [id number]) as of 01/02/2019 18:31  Ref. Range 01/02/2019 11:51 01/02/2019 15:14 01/02/2019 16:14 01/02/2019 17:43 01/02/2019 17:57  Glucose-Capillary Latest Ref Range: 70 - 99 mg/dL 457 (H) 589 (HH) 596 (HH) >600 (HH) >600 (HH)  Spoke with staff RN in regards to high blood sugars. RN to talk with physician.   Recommend checking a BMET now with the elevated blood sugars to check for DKA.  Also, if patient's blood sugars remain high, may need to start IV insulin.   Harvel Ricks RN BSN CDE Diabetes Coordinator Pager: [phone]  8am-5pm

## 2019-01-03 LAB — GLUCOSE, CAPILLARY
Glucose-Capillary: 280 mg/dL — ABNORMAL HIGH (ref 70–99)
Glucose-Capillary: 330 mg/dL — ABNORMAL HIGH (ref 70–99)
Glucose-Capillary: 195 mg/dL — ABNORMAL HIGH (ref 70–99)

## 2019-01-03 LAB — CBC
HCT: 24 % — ABNORMAL LOW (ref 39.0–52.0)
Hemoglobin: 8.3 g/dL — ABNORMAL LOW (ref 13.0–17.0)
MCH: 29.5 pg (ref 26.0–34.0)
MCHC: 34.6 g/dL (ref 30.0–36.0)
MCV: 85.4 fL (ref 80.0–100.0)
Platelets: 102 10*3/uL — ABNORMAL LOW (ref 150–400)
RBC: 2.81 MIL/uL — ABNORMAL LOW (ref 4.22–5.81)
RDW: 12.9 % (ref 11.5–15.5)
WBC: 9.9 10*3/uL (ref 4.0–10.5)
nRBC: 0 % (ref 0.0–0.2)

## 2019-01-03 LAB — BASIC METABOLIC PANEL
Anion gap: 9 (ref 5–15)
BUN: 49 mg/dL — ABNORMAL HIGH (ref 6–20)
CO2: 19 mmol/L — ABNORMAL LOW (ref 22–32)
Calcium: 8.1 mg/dL — ABNORMAL LOW (ref 8.9–10.3)
Chloride: 108 mmol/L (ref 98–111)
Creatinine, Ser: 2.34 mg/dL — ABNORMAL HIGH (ref 0.61–1.24)
GFR calc Af Amer: 35 mL/min — ABNORMAL LOW (ref >60)
GFR calc non Af Amer: 31 mL/min — ABNORMAL LOW (ref >60)
Glucose, Bld: 285 mg/dL — ABNORMAL HIGH (ref 70–99)
Potassium: 3.9 mmol/L (ref 3.5–5.1)
Sodium: 136 mmol/L (ref 135–145)

## 2019-01-03 MED ORDER — INSULIN ASPART 100 UNIT/ML ~~LOC~~ SOLN: 5.0000 [IU] | Freq: Three times a day (TID) | SUBCUTANEOUS | Status: DC

## 2019-01-03 MED ORDER — INSULIN ASPART 100 UNIT/ML ~~LOC~~ SOLN
4.0000 [IU] | Freq: Three times a day (TID) | SUBCUTANEOUS | Status: DC
  Administered 2019-01-03: 4 [IU] via SUBCUTANEOUS

## 2019-01-03 MED ORDER — INSULIN ASPART 100 UNIT/ML ~~LOC~~ SOLN: 0.0000 [IU] | SUBCUTANEOUS | Status: DC

## 2019-01-03 MED ORDER — INSULIN PUMP
SUBCUTANEOUS | Status: DC
  Administered 2019-01-04 (×3): via SUBCUTANEOUS
  Filled 2019-01-03: qty 1

## 2019-01-03 NOTE — Progress Notes (Signed)
Occupational Therapy Treatment Patient Details Name: Adrian Neal MRN: [id number] DOB: Aug 01, 1965 Today's Date: 01/03/2019    History of present illness Pt is a 53 y/o male with PMH of DM type 1, CKD 3, sleep apnea, HTN, CVA with L sided weakness, R eye cataract and retinopathy, admitted after a fall.  Found with L displaced femoral neck fx, s/p total hip arthroplasy, anterior approach 01/01/19.   OT comments  Patient progressing well.  Educated on AE for LB self care, return demonstrating use of sock aide and reacher for LB dressing and long sponge for LB bathing.  Able to complete toilet transfers with supervision, requires min assist for simulated tub transfers to manage L LE over threshold.  Updated equipment needs to 3:1 for use in shower for safety.  Will follow.     Follow Up Recommendations  No OT follow up;Supervision - Intermittent    Equipment Recommendations  3 in 1 bedside commode    Recommendations for Other Services      Precautions / Restrictions Precautions Precautions: None Restrictions Weight Bearing Restrictions: Yes LLE Weight Bearing: Weight bearing as tolerated       Mobility Bed Mobility               General bed mobility comments: up in chair upon arrival  Transfers Overall transfer level: Needs assistance Equipment used: Rolling walker (2 wheeled) Transfers: Sit to/from Stand Sit to Stand: Supervision         General transfer comment: supervision for safety    Balance Overall balance assessment: Needs assistance Sitting-balance support: No upper extremity supported;Feet supported Sitting balance-Leahy Scale: Good     Standing balance support: No upper extremity supported;During functional activity;Bilateral upper extremity supported Standing balance-Leahy Scale: Poor Standing balance comment: relaint on BUE support dynamically                           ADL either performed or assessed with clinical judgement   ADL  Overall ADL's : Needs assistance/impaired             Lower Body Bathing: Supervison/ safety;Sit to/from stand;Cueing for compensatory techniques;With adaptive equipment Lower Body Bathing Details (indicate cue type and reason): educated on use of long sponge for bathing while seated in showe r     Lower Body Dressing: Supervision/safety;Sit to/from stand;Cueing for compensatory techniques;With adaptive equipment Lower Body Dressing Details (indicate cue type and reason): eudcated on use of reacher, socks aide for LB dressing and pt return demosntrated use; supervision sit<>stand Toilet Transfer: Supervision/safety;RW Toilet Transfer Details (indicate cue type and reason): simulated to recliner      Tub/ Shower Transfer: Minimal assistance;Tub transfer;Ambulation;Cueing for safety;Cueing for sequencing;3 in 1;Rolling walker Tub/Shower Transfer Details (indicate cue type and reason): reviewed technique for tub transfers using RW and 3:1, requires min assist due to decreased strength/ROM/pain in L LE Functional mobility during ADLs: Supervision/safety;Rolling walker       Vision       Perception     Praxis      Cognition Arousal/Alertness: Awake/alert Behavior During Therapy: WFL for tasks assessed/performed Overall Cognitive Status: Within Functional Limits for tasks assessed                                          Exercises     Shoulder Instructions  General Comments      Pertinent Vitals/ Pain       Pain Assessment: Faces Faces Pain Scale: Hurts a little bit Pain Location: L hip Pain Descriptors / Indicators: Discomfort;Operative site guarding;Sore Pain Intervention(s): Monitored during session;Repositioned;Ice applied  Home Living                                          Prior Functioning/Environment              Frequency  Min 2X/week        Progress Toward Goals  OT Goals(current goals can now be found  in the care plan section)  Progress towards OT goals: Progressing toward goals  Acute Rehab OT Goals Patient Stated Goal: to get back home and move better OT Goal Formulation: With patient Time For Goal Achievement: 01/16/19 Potential to Achieve Goals: Good  Plan Discharge plan remains appropriate;Frequency remains appropriate    Co-evaluation                 AM-PAC OT "6 Clicks" Daily Activity     Outcome Measure   Help from another person eating meals?: None Help from another person taking care of personal grooming?: None Help from another person toileting, which includes using toliet, bedpan, or urinal?: None Help from another person bathing (including washing, rinsing, drying)?: None Help from another person to put on and taking off regular upper body clothing?: None Help from another person to put on and taking off regular lower body clothing?: A Little 6 Click Score: 23    End of Session Equipment Utilized During Treatment: Rolling walker  OT Visit Diagnosis: Other abnormalities of gait and mobility (R26.89);Pain Pain - Right/Left: Left Pain - part of body: Hip   Activity Tolerance Patient tolerated treatment well   Patient Left in chair;with call bell/phone within reach   Nurse Communication Mobility status        Time: 4098-1191 OT Time Calculation (min): 27 min  Charges: OT General Charges $OT Visit: 1 Visit OT Treatments $Self Care/Home Management : 23-37 mins  Delight Stare, OT Acute Rehabilitation Services Pager [phone] Office [phone]    Delight Stare 01/03/2019, 11:48 AM

## 2019-01-03 NOTE — Progress Notes (Signed)
Notified Dr C. Gherghe that pt wants to use his insulin pump to cover his CBGs prior to breakfast. His CBG was 280 this AM. Will ask day shift RN Neva to continue to monitor patient's status.

## 2019-01-03 NOTE — Progress Notes (Signed)
Physical Therapy Treatment Patient Details Name: Adrian Neal MRN: [id number] DOB: 16-Oct-1965 Today's Date: 01/03/2019    History of Present Illness Pt is a 53 y/o male with PMH of DM type 1, CKD 3, sleep apnea, HTN, CVA with L sided weakness, R eye cataract and retinopathy, admitted after a fall.  Found with L displaced femoral neck fx, s/p total hip arthroplasy, anterior approach 01/01/19.    PT Comments    Patient seen for mobility progression. Pt is making good progress toward PT goals and tolerated session well without increased pain. Continue to progress as tolerated.    Follow Up Recommendations  Follow surgeon's recommendation for DC plan and follow-up therapies     Equipment Recommendations  Rolling walker with 5" wheels    Recommendations for Other Services       Precautions / Restrictions Precautions Precautions: None Restrictions Weight Bearing Restrictions: Yes LLE Weight Bearing: Weight bearing as tolerated    Mobility  Bed Mobility               General bed mobility comments: up in chair upon arrival  Transfers Overall transfer level: Needs assistance Equipment used: Rolling walker (2 wheeled) Transfers: Sit to/from Stand Sit to Stand: Supervision         General transfer comment: supervision for safety  Ambulation/Gait Ambulation/Gait assistance: Supervision Gait Distance (Feet): 200 Feet Assistive device: Rolling walker (2 wheeled) Gait Pattern/deviations: Step-through pattern;Decreased stride length Gait velocity: decreased   General Gait Details: cues for increased bilat step lengths; reliance on bilat UE for support    Stairs Stairs: Yes Stairs assistance: Min guard;Min assist Stair Management: One rail Right;Step to pattern;Forwards Number of Stairs: 6(limited by IV line) General stair comments: cues for sequencing and technique; HHA in addition to rail to descend; discussed using SPC and rail    Wheelchair Mobility     Modified Rankin (Stroke Patients Only)       Balance Overall balance assessment: Needs assistance Sitting-balance support: No upper extremity supported;Feet supported Sitting balance-Leahy Scale: Good     Standing balance support: During functional activity;Bilateral upper extremity supported Standing balance-Leahy Scale: Poor Standing balance comment: relaint on BUE support dynamically                            Cognition Arousal/Alertness: Awake/alert Behavior During Therapy: WFL for tasks assessed/performed Overall Cognitive Status: Within Functional Limits for tasks assessed                                        Exercises      General Comments        Pertinent Vitals/Pain Pain Assessment: Faces Faces Pain Scale: Hurts a little bit Pain Location: L hip Pain Descriptors / Indicators: Discomfort;Operative site guarding;Sore Pain Intervention(s): Monitored during session;Repositioned    Home Living                      Prior Function            PT Goals (current goals can now be found in the care plan section) Acute Rehab PT Goals Patient Stated Goal: to get back home and move better Progress towards PT goals: Progressing toward goals    Frequency    Min 5X/week      PT Plan Current plan remains appropriate    Co-evaluation  AM-PAC PT "6 Clicks" Mobility   Outcome Measure  Help needed turning from your back to your side while in a flat bed without using bedrails?: None Help needed moving from lying on your back to sitting on the side of a flat bed without using bedrails?: A Little Help needed moving to and from a bed to a chair (including a wheelchair)?: A Little Help needed standing up from a chair using your arms (e.g., wheelchair or bedside chair)?: A Little Help needed to walk in hospital room?: A Little Help needed climbing 3-5 steps with a railing? : A Little 6 Click Score: 19     End of Session Equipment Utilized During Treatment: Gait belt Activity Tolerance: Patient tolerated treatment well Patient left: in chair;with call bell/phone within reach Nurse Communication: Mobility status PT Visit Diagnosis: Unsteadiness on feet (R26.81);Muscle weakness (generalized) (M62.81)     Time: 6770-3403 PT Time Calculation (min) (ACUTE ONLY): 32 min  Charges:  $Gait Training: 23-37 mins                     [name], PTA Acute Rehabilitation Services Pager: [phone] Office: [phone]     Darliss Cheney 01/03/2019, 1:30 PM

## 2019-01-03 NOTE — Progress Notes (Signed)
Subjective: 2 Days Post-Op Procedure(s) (LRB): ANTERIOR APPROACH total hip ARTHROPLASTY (Left) Patient reports pain as mild.   Patient is well, and has had no acute complaints or problems other than discomfort in the left hip. No acute events overnight. Seen while working with PT this am.  States he has been having some issues with BS control, but doing better. He is voiding without difficulty, positive flatus. Denies nausea or vomiting.  We will continue therapy today.   Objective: Vital signs in last 24 hours: Temp:  [98.5 F (36.9 C)-99.7 F (37.6 C)] 98.5 F (36.9 C) (09/06 0738) Pulse Rate:  [88-100] 93 (09/06 0738) Resp:  [14-18] 16 (09/06 0738) BP: (136-164)/(55-69) 164/67 (09/06 0738) SpO2:  [92 %-99 %] 95 % (09/06 0738)  Intake/Output from previous day:  Intake/Output Summary (Last 24 hours) at 01/03/2019 0905 Last data filed at 01/02/2019 1735 Gross per 24 hour  Intake 468 ml  Output 451 ml  Net 17 ml     Intake/Output this shift: No intake/output data recorded.  Labs: Recent Labs    12/31/18 2050 01/01/19 0434 01/02/19 0415 01/03/19 0728  HGB 13.3 11.6* 8.9* 8.3*   Recent Labs    01/02/19 0415 01/03/19 0728  WBC 11.9* 9.9  RBC 3.05* 2.81*  HCT 27.2* 24.0*  PLT 110* 102*   Recent Labs    01/02/19 1856 01/03/19 0728  NA 127* 136  K 4.2 3.9  CL 101 108  CO2 17* 19*  BUN 53* 49*  CREATININE 2.69* 2.34*  GLUCOSE 615* 285*  CALCIUM 7.8* 8.1*   Recent Labs    12/31/18 2050  INR 1.1    Exam: General - Patient is Alert and Oriented Extremity - Neurologically intact Sensation intact distally Intact pulses distally Dorsiflexion/Plantar flexion intact Dressing - dressing C/D/I Motor Function - intact, moving foot and toes well on exam.   Past Medical History:  Diagnosis Date  . Cataract 1994   right eye  . Chronic kidney disease   . Diabetes mellitus without complication ([place])    diagnosed at age 63  . Eye problems   . Heart murmur  1996  . High cholesterol    patient denies but take preventative medicine  . Hypertension   . Retinopathy due to secondary diabetes mellitus ([place])    right  . Sleep apnea    currently not one but looking to get one   . Stroke ([place]) 2016    Assessment/Plan: 2 Days Post-Op Procedure(s) (LRB): ANTERIOR APPROACH total hip ARTHROPLASTY (Left) Principal Problem:   Closed left femoral fracture (HCC) Active Problems:   Type 1 diabetes mellitus (HCC)   Hyperlipidemia   Essential hypertension   HLD (hyperlipidemia)   OSA (obstructive sleep apnea)   CKD (chronic kidney disease) stage 3, GFR 30-59 ml/min (HCC)  Estimated body mass index is 30.4 kg/m as calculated from the following:   Height as of this encounter: 5\' 8"  (1.727 m).   Weight as of this encounter: 90.7 kg. Advance diet Up with therapy  DVT Prophylaxis - Aspirin Weight bearing as tolerated. D/C O2 and pulse ox and try on room air.   Plan is to go Home after hospital stay. From an orthopedic standpoint, patient is ready for discharge today or tomorrow as long as he is meeting goals with physical therapy. Discharge when ready per medicine. Aspirin and Norco sent to his pharmacy. He will follow up with Dr. Lyla Glassing in the office in 2 weeks.  Nicholes Stairs  Orthopedic  Surgery 01/03/2019, 9:05 AM

## 2019-01-03 NOTE — Plan of Care (Signed)
  Problem: Education: Goal: Knowledge of General Education information will improve Description Including pain rating scale, medication(s)/side effects and non-pharmacologic comfort measures Outcome: Progressing   

## 2019-01-03 NOTE — Plan of Care (Signed)
  Problem: Pain Managment: Goal: General experience of comfort will improve Outcome: Progressing   Problem: Safety: Goal: Ability to remain free from injury will improve Outcome: Progressing   Problem: Skin Integrity: Goal: Risk for impaired skin integrity will decrease Outcome: Progressing   

## 2019-01-03 NOTE — Progress Notes (Signed)
PROGRESS NOTE  Adrian Neal WFU:[id number] DOB: July 22, 1965 DOA: 12/31/2018 PCP: [name], MD   LOS: 3 days   Brief Narrative / Interim history: 53 year old male with type 1 diabetes mellitus on insulin pump, CKD stage III, diabetic nephropathy/retinopathy, OSA, obesity who came to the hospital and was admitted on 12/31/2018 after having a fall at home and found to have left femoral neck fracture.  Orthopedic surgery will operate on 9/4  Subjective: -Feeling better this morning, stronger, and walking in the hallway with physical therapy  Assessment & Plan: Principal Problem:   Closed left femoral fracture (HCC) Active Problems:   Type 1 diabetes mellitus (HCC)   Hyperlipidemia   Essential hypertension   HLD (hyperlipidemia)   OSA (obstructive sleep apnea)   CKD (chronic kidney disease) stage 3, GFR 30-59 ml/min (HCC)   Principal Problem Left proximal femoral fracture -Orthopedic surgery consulted and followed patient while hospitalized.  He underwent total hip arthroplasty on 01/01/2019.  PT evaluation pending -DVT prophylaxis per orthopedic surgery -Appears to be ready for discharge from orthopedic standpoint  Active Problems Type 1 diabetes mellitus -patient's insulin pump loss discharged on 9/4 and he was transitioned to Levemir and sliding scale.  He is able to get the supplies and pump discharge this morning.  Fasting CBG is 317.  Will allow patient to reattach his insulin pump, he will correct administration based on this morning CBG.  Discontinue Levemir -Pump was reattached as mentioned above yesterday morning however throughout the day does not seem like he was delivering anything in his CBGs by the end of the day were in the 600s.  He was given Levemir along with several doses of short-acting NovoLog.  This morning fasting CBG is 280 but repeat is in the 400 range.  He is still not able to reattach his insulin pump as he does not have a reservoir yet.  His wife  is bringing one little bit later.  For now we will continue to cover per hospital protocol.  I have changed him to every 4 hours CBC and will closely monitor throughout the day to prevent DKA -May need glucose stabilizer if CBGs continue to rise throughout the day  Acute kidney injury on chronic kidney disease stage III -Baseline creatinine 1.8-1.9, worsening in the setting of acute urinary retention likely due to underlying BPH along with acute fracture/postop.  He was placed on Flomax and he was able to tolerate Foley removal catheter yesterday.  His creatinine is now improving -Hold lisinopril  Acute urinary obstruction /history of BPH -CT scan on admission showed large volume of urine in the bladder as well as enlarged prostate.  Patient is aware that his prostate is enlarged based on an exam in his PCPs office -Tolerating Flomax, continue, urinating on his own no longer retaining  OSA -Continue CPAP  Hypertension/hyperlipidemia -continue home medications   Scheduled Meds:  amLODipine  10 mg Oral Daily   aspirin  81 mg Oral BID WC   atorvastatin  40 mg Oral Daily   carvedilol  25 mg Oral BID WC   clopidogrel  75 mg Oral Daily   docusate sodium  100 mg Oral BID   insulin aspart  0-9 Units Subcutaneous Q4H   insulin detemir  24 Units Subcutaneous Daily   pantoprazole  40 mg Oral Daily   senna  1 tablet Oral BID   tamsulosin  0.4 mg Oral QPC breakfast   Vitamin D (Ergocalciferol)  50,000 Units Oral Q Sat  Continuous Infusions:  sodium chloride 100 mL/hr at 01/01/19 2121   PRN Meds:.acetaminophen, HYDROcodone-acetaminophen, HYDROcodone-acetaminophen, meclizine, menthol-cetylpyridinium **OR** phenol, metoCLOPramide **OR** metoCLOPramide (REGLAN) injection, morphine injection, morphine injection, ondansetron **OR** ondansetron (ZOFRAN) IV  DVT prophylaxis: per ortho Code Status: Full code Family Communication: d/w patient  Disposition Plan: TBD  Consultants:    Orthopedic surgery   Procedures:   Left total hip arthroplasty by Dr. Lyla Glassing on 01/01/2019  Antimicrobials:  None   Objective: Vitals:   01/02/19 1620 01/02/19 2112 01/03/19 0352 01/03/19 0738  BP: (!) 143/63 136/69 (!) 146/65 (!) 164/67  Pulse: 100 97 88 93  Resp: 14   16  Temp: 99.7 F (37.6 C) 98.6 F (37 C) 98.6 F (37 C) 98.5 F (36.9 C)  TempSrc: Oral Oral Oral Oral  SpO2: 93% 96% 99% 95%  Weight:      Height:        Intake/Output Summary (Last 24 hours) at 01/03/2019 1145 Last data filed at 01/03/2019 0900 Gross per 24 hour  Intake 228 ml  Output 450 ml  Net -222 ml   Filed Weights   12/31/18 1804 01/01/19 1439  Weight: 90.7 kg 90.7 kg    Examination:  Constitutional: NAD, sitting at the edge of the bed and then later seen walking in the hallway with physical therapy Eyes: No scleral icterus ENMT: Moist membranes Respiratory: Clear bilaterally, no wheezing, no crackles Cardiovascular: Regular rate and rhythm, no murmurs.  No edema Abdomen: Soft, NT, ND, positive bowel sounds Musculoskeletal: no clubbing / cyanosis.  Skin: No rashes seen Neurologic: Nonfocal, equal strength   Data Reviewed: I have independently reviewed following labs and imaging studies   CBC: Recent Labs  Lab 12/31/18 2050 01/01/19 0434 01/02/19 0415 01/03/19 0728  WBC 11.7* 10.1 11.9* 9.9  HGB 13.3 11.6* 8.9* 8.3*  HCT 39.7 33.7* 27.2* 24.0*  MCV 88.2 87.3 89.2 85.4  PLT 130* 115* 110* 381*   Basic Metabolic Panel: Recent Labs  Lab 12/31/18 2050 01/01/19 0434 01/02/19 0415 01/02/19 1856 01/03/19 0728  NA 142 138 138 127* 136  K 3.8 4.2 4.3 4.2 3.9  CL 108 107 111 101 108  CO2 22 19* 17* 17* 19*  GLUCOSE 122* 240* 300* 615* 285*  BUN 24* 23* 32* 53* 49*  CREATININE 2.14* 2.08* 2.41* 2.69* 2.34*  CALCIUM 8.9 8.3* 8.0* 7.8* 8.1*   GFR: Estimated Creatinine Clearance: 39.9 mL/min (A) (by C-G formula based on SCr of 2.34 mg/dL (H)). Liver Function  Tests: Recent Labs  Lab 12/31/18 2050 01/01/19 0434  AST 26 18  ALT 40 31  ALKPHOS 117 98  BILITOT 0.9 1.4*  PROT 6.6 5.7*  ALBUMIN 3.9 3.2*   No results for input(s): LIPASE, AMYLASE in the last 168 hours. No results for input(s): AMMONIA in the last 168 hours. Coagulation Profile: Recent Labs  Lab 12/31/18 2050  INR 1.1   Cardiac Enzymes: No results for input(s): CKTOTAL, CKMB, CKMBINDEX, TROPONINI in the last 168 hours. BNP (last 3 results) No results for input(s): PROBNP in the last 8760 hours. HbA1C: Recent Labs    01/01/19 0434  HGBA1C 7.2*   CBG: Recent Labs  Lab 01/02/19 1743 01/02/19 1757 01/02/19 2115 01/02/19 2209 01/03/19 0621  GLUCAP >600* >600* 461* 423* 280*   Lipid Profile: No results for input(s): CHOL, HDL, LDLCALC, TRIG, CHOLHDL, LDLDIRECT in the last 72 hours. Thyroid Function Tests: No results for input(s): TSH, T4TOTAL, FREET4, T3FREE, THYROIDAB in the last 72 hours. Anemia Panel: No results  for input(s): VITAMINB12, FOLATE, FERRITIN, TIBC, IRON, RETICCTPCT in the last 72 hours. Urine analysis:    Component Value Date/Time   COLORURINE YELLOW 10/28/2018 0839   APPEARANCEUR CLEAR 10/28/2018 0839   LABSPEC 1.015 10/28/2018 0839   PHURINE 5.5 10/28/2018 0839   GLUCOSEU NEGATIVE 10/28/2018 0839   HGBUR NEGATIVE 10/28/2018 0839   BILIRUBINUR NEGATIVE 10/28/2018 [place] 10/28/2018 0839   PROTEINUR 30 (A) 01/19/2015 2013   UROBILINOGEN 0.2 10/28/2018 0839   NITRITE NEGATIVE 10/28/2018 0839   LEUKOCYTESUR NEGATIVE 10/28/2018 0839   Sepsis Labs: Invalid input(s): PROCALCITONIN, LACTICIDVEN  Recent Results (from the past 240 hour(s))  SARS CORONAVIRUS 2 (TAT 6-24 HRS) Nasopharyngeal Nasopharyngeal Swab     Status: None   Collection Time: 12/31/18  9:07 PM   Specimen: Nasopharyngeal Swab  Result Value Ref Range Status   SARS Coronavirus 2 NEGATIVE NEGATIVE Final    Comment: (NOTE) SARS-CoV-2 target nucleic acids are  NOT DETECTED. The SARS-CoV-2 RNA is generally detectable in upper and lower respiratory specimens during the acute phase of infection. Negative results do not preclude SARS-CoV-2 infection, do not rule out co-infections with other pathogens, and should not be used as the sole basis for treatment or other patient management decisions. Negative results must be combined with clinical observations, patient history, and epidemiological information. The expected result is Negative. Fact Sheet for Patients: SugarRoll.be Fact Sheet for Healthcare Providers: https://www.woods-mathews.com/ This test is not yet approved or cleared by the [place] FDA and  has been authorized for detection and/or diagnosis of SARS-CoV-2 by FDA under an Emergency Use Authorization (EUA). This EUA will remain  in effect (meaning this test can be used) for the duration of the COVID-19 declaration under Section 56 4(b)(1) of the Act, 21 U.S.C. section 360bbb-3(b)(1), unless the authorization is terminated or revoked sooner. Performed at [facility], [place] [address] [place], [place] [address]   Surgical pcr screen     Status: None   Collection Time: 01/01/19 12:49 AM   Specimen: Nasal Mucosa; Nasal Swab  Result Value Ref Range Status   MRSA, PCR NEGATIVE NEGATIVE Final   Staphylococcus aureus NEGATIVE NEGATIVE Final    Comment: (NOTE) The Xpert SA Assay (FDA approved for NASAL specimens in patients 24 years of age and older), is one component of a comprehensive surveillance program. It is not intended to diagnose infection nor to guide or monitor treatment. Performed at [facility], [place] [address] [place], [place] [address]       Radiology Studies: Pelvis Portable  Result Date: 01/01/2019 CLINICAL DATA:  Status post total left hip replacement EXAM: PORTABLE PELVIS 1-2 VIEWS COMPARISON:  January 01, 2019 FINDINGS: The patient is status post left  total hip arthroplasty. No periprosthetic lucency or fracture is identified. Overlying soft tissue swelling subcutaneous emphysema. IMPRESSION: Status post left total hip arthroplasty without complication. Electronically Signed   By: Prudencio Pair M.D.   On: 01/01/2019 19:36   Dg C-arm 1-60 Min  Result Date: 01/01/2019 CLINICAL DATA:  Left hip replacement EXAM: OPERATIVE left HIP (WITH PELVIS IF PERFORMED) 2 VIEWS TECHNIQUE: Fluoroscopic spot image(s) were submitted for interpretation post-operatively. COMPARISON:  12/31/2018 FINDINGS: Two low resolution intraoperative spot views of the left hip. Total fluoroscopy time was 26 seconds. The images demonstrate a left hip replacement with normal alignment IMPRESSION: Intraoperative fluoroscopic assistance provided during left hip replacement surgery Electronically Signed   By: Donavan Foil M.D.   On: 01/01/2019 18:59  Dg Hip Operative Unilat W Or W/o Pelvis Left  Result Date: 01/01/2019 CLINICAL DATA:  Left hip replacement EXAM: OPERATIVE left HIP (WITH PELVIS IF PERFORMED) 2 VIEWS TECHNIQUE: Fluoroscopic spot image(s) were submitted for interpretation post-operatively. COMPARISON:  12/31/2018 FINDINGS: Two low resolution intraoperative spot views of the left hip. Total fluoroscopy time was 26 seconds. The images demonstrate a left hip replacement with normal alignment IMPRESSION: Intraoperative fluoroscopic assistance provided during left hip replacement surgery Electronically Signed   By: Donavan Foil M.D.   On: 01/01/2019 18:59   Time spent: 35 minutes, several visits and discussion with the bedside RN throughout the day regarding CBG control  Marzetta Board, MD, PhD [facility]  Contact via  [email or web address]  [place] P: [phone] F: [phone]

## 2019-01-04 DIAGNOSIS — N179 Acute kidney failure, unspecified: Secondary | ICD-10-CM

## 2019-01-04 LAB — GLUCOSE, CAPILLARY
Glucose-Capillary: 124 mg/dL — ABNORMAL HIGH (ref 70–99)
Glucose-Capillary: 201 mg/dL — ABNORMAL HIGH (ref 70–99)
Glucose-Capillary: 175 mg/dL — ABNORMAL HIGH (ref 70–99)
Glucose-Capillary: 158 mg/dL — ABNORMAL HIGH (ref 70–99)

## 2019-01-04 MED ORDER — TAMSULOSIN HCL 0.4 MG PO CAPS: 0.4000 mg | ORAL_CAPSULE | Freq: Every day | ORAL | 1 refills | Status: DC

## 2019-01-04 MED ORDER — HYDRALAZINE HCL 10 MG PO TABS: 10.0000 mg | ORAL_TABLET | Freq: Three times a day (TID) | ORAL | 1 refills | Status: DC

## 2019-01-04 MED ORDER — HYDRALAZINE HCL 10 MG PO TABS
10.0000 mg | ORAL_TABLET | Freq: Three times a day (TID) | ORAL | Status: DC
  Administered 2019-01-04: 10 mg via ORAL
  Filled 2019-01-04: qty 1

## 2019-01-04 NOTE — TOC Initial Note (Signed)
Transition of Care [facility]) - Initial/Assessment Note    Patient Details  Name: Adrian Neal MRN: [id number] Date of Birth: 1965-10-03  [facility] of [facility] CM/SW Contact:    Bartholomew Crews, RN Phone Number: [phone] 01/04/2019, 1:50 PM  Clinical Narrative:                 Spoke with patient at the bedside. PTA home, independent. Discussed recommendations for [facility] PT - referral accepted by [facility]. Will need RW and 3N1 - will contact [employer] tomorrow 9/8 and arrange delivery to patient home. No other transition of care needs identified.         Patient Goals and CMS Choice        Expected Discharge Plan and Services           Expected Discharge Date: 01/04/19                                    Prior Living Arrangements/Services                       Activities of Daily Living Home Assistive Devices/Equipment: None ADL Screening (condition at time of admission) Patient's cognitive ability adequate to safely complete daily activities?: Yes Is the patient deaf or have difficulty hearing?: No Does the patient have difficulty seeing, even when wearing glasses/contacts?: No Does the patient have difficulty concentrating, remembering, or making decisions?: No Patient able to express need for assistance with ADLs?: No Does the patient have difficulty dressing or bathing?: No Independently performs ADLs?: No Communication: Independent Does the patient have difficulty walking or climbing stairs?: No Weakness of Legs: None Weakness of Arms/Hands: None  Permission Sought/Granted                  Emotional Assessment              Admission diagnosis:  Displaced fracture of left femoral neck ([place]) [S72.002A] Fall from slip, trip, or stumble, initial encounter [W01.0XXA] CRI (chronic renal insufficiency), stage 3 (moderate) (HCC) [N18.3] Closed displaced fracture of left femoral neck ([place]) [S72.002A] Patient Active Problem List   Diagnosis  Date Noted  . Closed left femoral fracture ([place]) 12/31/2018  . Diabetes mellitus type I ([place]) 12/17/2018  . Type 1 diabetes mellitus with stage 3 chronic kidney disease ([place]) 08/12/2018  . 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 ([place]) 08/12/2018  . Type 1 diabetes mellitus with hyperglycemia ([place]) 08/12/2018  . Vitamin D deficiency 07/15/2018  . CKD (chronic kidney disease) stage 3, GFR 30-59 ml/min (HCC) 06/16/2018  . Healthcare maintenance 06/15/2018  . History of CVA (cerebrovascular accident) 06/01/2018  . OSA (obstructive sleep apnea) 03/29/2015  . Stroke ([place])   . HLD (hyperlipidemia) 11/22/2014  . 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 Ataxia   . CVA (cerebral infarction) 08/03/2014  . Insulin dependent diabetes mellitus ([place])   . Cerebral thrombosis with cerebral infarction ([place]) 07/28/2014  . Intractable nausea and vomiting 07/27/2014  . Vertigo 07/27/2014  . Essential hypertension 07/27/2014  . Type 1 diabetes mellitus ([place]) 05/29/2010  . Hyperlipidemia 05/29/2010  . 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

## 2019-01-04 NOTE — Care Management Important Message (Signed)
Important Message  Patient Details  Name: Adrian Neal MRN: [id number] Date of Birth: [birth year]   Medicare Important Message Given:  Yes     Memory Argue 01/04/2019, 3:37 PM

## 2019-01-04 NOTE — Progress Notes (Signed)
   01/04/19 0933  AVS Discharge Documentation  AVS Discharge Instructions Including Medications Provided to patient/caregiver  Name of Person Receiving AVS Discharge Instructions Including Medications Adrian Neal  Name of Clinician That Reviewed AVS Discharge Instructions Including Medications Dineen Kid, RN

## 2019-01-04 NOTE — Progress Notes (Signed)
Physical Therapy Treatment Patient Details Name: PAXSON HARROWER MRN: [id number] DOB: May 27, 1965 Today's Date: 01/04/2019    History of Present Illness Pt is a 53 y/o male with PMH of DM type 1, CKD 3, sleep apnea, HTN, CVA with L sided weakness, R eye cataract and retinopathy, admitted after a fall.  Found with L displaced femoral neck fx, s/p total hip arthroplasy, anterior approach 01/01/19.    PT Comments    Pt performed gt training and functional mobility during session this pm.  Pt is progressing well.  Reviewed stair training with use of SPC.  Pt has SPC at home for use of stairs but will require a RW for home use.  CM reports equipment will be delivered tomorrow and pt reports he will borrow a RW from th [facility] in the meantime.  Plan for d/c home today with HHPT.  PTA issued HEP for home use and reviewed all exercises.     Follow Up Recommendations  Follow surgeon's recommendation for DC plan and follow-up therapies     Equipment Recommendations  Rolling walker with 5" wheels    Recommendations for Other Services       Precautions / Restrictions Precautions Precautions: None Restrictions Weight Bearing Restrictions: Yes LLE Weight Bearing: Weight bearing as tolerated    Mobility  Bed Mobility               General bed mobility comments: Pt sitting edge of bed on arrival.  Transfers Overall transfer level: Needs assistance Equipment used: Rolling walker (2 wheeled) Transfers: Sit to/from Stand Sit to Stand: Supervision         General transfer comment: supervision for safety  Ambulation/Gait Ambulation/Gait assistance: Supervision Gait Distance (Feet): 150 Feet Assistive device: Rolling walker (2 wheeled) Gait Pattern/deviations: Step-through pattern;Decreased stride length;Step-to pattern Gait velocity: decreased   General Gait Details: cues for increased bilat step lengths; reliance on bilat UE for support    Stairs Stairs: Yes Stairs assistance:  Supervision Stair Management: One rail Right;With cane Number of Stairs: 10 General stair comments: cues for sequencing and technique; Cues for placement of cane on stairs.   Wheelchair Mobility    Modified Rankin (Stroke Patients Only)       Balance Overall balance assessment: Needs assistance Sitting-balance support: No upper extremity supported;Feet supported Sitting balance-Leahy Scale: Good       Standing balance-Leahy Scale: Poor                              Cognition Arousal/Alertness: Awake/alert Behavior During Therapy: WFL for tasks assessed/performed Overall Cognitive Status: Within Functional Limits for tasks assessed                                        Exercises Total Joint Exercises Ankle Circles/Pumps: AROM;Both;20 reps;Supine Quad Sets: AROM;10 reps;Supine;Left Heel Slides: AAROM;Left;10 reps;Supine Hip ABduction/ADduction: AAROM;Supine;Left;10 reps Long Arc Quad: AROM;Left;10 reps;Supine    General Comments        Pertinent Vitals/Pain Pain Assessment: Faces Faces Pain Scale: Hurts a little bit Pain Location: L hip Pain Descriptors / Indicators: Discomfort;Operative site guarding;Sore Pain Intervention(s): Monitored during session;Repositioned    Home Living                      Prior Function  PT Goals (current goals can now be found in the care plan section) Acute Rehab PT Goals Patient Stated Goal: to get back home and move better PT Goal Formulation: With patient Potential to Achieve Goals: Good Progress towards PT goals: Progressing toward goals    Frequency    Min 5X/week      PT Plan Current plan remains appropriate    Co-evaluation              AM-PAC PT "6 Clicks" Mobility   Outcome Measure  Help needed turning from your back to your side while in a flat bed without using bedrails?: None Help needed moving from lying on your back to sitting on the side of a  flat bed without using bedrails?: None Help needed moving to and from a bed to a chair (including a wheelchair)?: A Little Help needed standing up from a chair using your arms (e.g., wheelchair or bedside chair)?: A Little Help needed to walk in hospital room?: A Little Help needed climbing 3-5 steps with a railing? : A Little 6 Click Score: 20    End of Session Equipment Utilized During Treatment: Gait belt Activity Tolerance: Patient tolerated treatment well Patient left: in chair;with call bell/phone within reach Nurse Communication: Mobility status PT Visit Diagnosis: Unsteadiness on feet (R26.81);Muscle weakness (generalized) (M62.81)     Time: 6244-6950 PT Time Calculation (min) (ACUTE ONLY): 27 min  Charges:  $Gait Training: 8-22 mins $Therapeutic Exercise: 8-22 mins                     Governor Rooks, PTA Acute Rehabilitation Services Pager [phone] Office [phone]     Corley Kohls Eli Hose 01/04/2019, 3:04 PM

## 2019-01-04 NOTE — Progress Notes (Signed)
Re educated regarding new discharge instructions.  01/04/19 1231  AVS Discharge Documentation  AVS Discharge Instructions Including Medications Provided to patient/caregiver  Name of Person Receiving AVS Discharge Instructions Including Medications Adrian Neal  Name of Clinician That Reviewed AVS Discharge Instructions Including Medications Dineen Kid, RN

## 2019-01-04 NOTE — Discharge Summary (Signed)
Physician Discharge Summary  GERMANY CHELF TXM:[id number] DOB: January 31, 1966 DOA: 12/31/2018  PCP: [name], MD  Admit date: 12/31/2018 Discharge date: 01/04/2019  Admitted From: home Disposition:  home  Recommendations for Outpatient Follow-up:  1. Follow up with PCP in 1-2 weeks, please recheck a BMP, monitor blood pressure and if appropriate place patient back again on lisinopril 2. Follow-up with Dr. Lyla Glassing in 2 weeks  Home Health: PT Equipment/Devices: None  Discharge Condition: Stable CODE STATUS: Full code Diet recommendation: Diabetic  HPI: Per admitting MD, Adrian Neal is a 53 y.o. male with medical history significant of type 1 diabetes on insulin pump, chronic kidney disease stage III, diabetic nephropathy and retinopathy, obstructive sleep apnea, morbid obesity who sustained a mechanical fall at home.  He was using the blower on his own driveway when his right foot got caught up in the blower cords and he tripped and fell on the left hip.  He immediately felt a pop and pain on the left side.  Pain is currently 7 out of 10.  Associated with inability to put weight on the left side.  He was seen in the ER and found to have left femoral neck fracture.  Orthopedic consulted and patient is being admitted to the hospital for possible repair tomorrow.  He denied any fever or chills, no fever, no nausea vomiting or diarrhea.  He has been consistent and compliant with his medications.  His blood sugar is notably between 120s to 150s. ED Course: Temperature is 99.1 blood pressure 178/73 pulse 111 respiratory of 21 oxygen sat is 95% room air.  CT of the hip showed acute fracture of the proximal left femur also significantly distended urinary bladder with prostatomegaly.  8142 potassium 3.8 chloride 108 CO2 22 glucose 122 BUN 25 creatinine 2.14.  Rest of chemistry and CBC within normal.   Hospital Course: Principal Problem Left proximal femoral fracture -Orthopedic  surgery consulted and followed patient while hospitalized.  He underwent total hip arthroplasty on 01/01/2019.  He recovered extremely well postop, worked with physical therapy and he was cleared for home discharge.  Home health therapies were prescribed on discharge.  Active Problems Type 1 diabetes mellitus -patient's insulin pump battery discharged completely on 9/4 and shut off, he was hyperglycemic and he was transitioned to Levemir and sliding scale. He reintroduced his own pump on 5/5, however throughout the day his CBGs were increasing dramatically and by afternoon they were in the 600s.  It later turned out that his pump was not actually working.  He was placed back on her Levemir and several doses of sliding scale were needed to correct his CBGs.  He replaced all his pump supplies on 9/6, pump was reattached and his CBGs have improved and normalized by 9/7, and he was discharged home in stable condition Acute kidney injury on chronic kidney disease stage III -Baseline creatinine 1.8-1.9, worsening in the setting of acute urinary retention likely due to underlying BPH along with acute fracture/postop, and his creatinine increased as high as 2.69. He did require Foley catheter placement.  He was started on Flomax, he was given a voiding trial and was able to urinate on his own.  His creatinine improving, 2.34 on discharge.  Advised to follow-up with PCP within a week for repeat blood work.  His lisinopril has been placed on hold and his blood pressure regimen has been adjusted and hydralazine was added.  He would likely benefit from being on lisinopril again in  the future if his kidney function stabilizes Acute urinary obstruction /history of BPH -CT scan on admission showed large volume of urine in the bladder as well as enlarged prostate.  Patient is aware that his prostate is enlarged based on an exam in his PCPs office.  He is tolerating Flomax, will prescribe on discharge.  Given significant issues  while hospitalized may benefit from urologic evaluation as an outpatient OSA -Continue CPAP Hypertension/hyperlipidemia -continue home medications  Discharge Diagnoses:  Principal Problem:   Closed left femoral fracture ([place]) Active Problems:   Type 1 diabetes mellitus (HCC)   Hyperlipidemia   Essential hypertension   HLD (hyperlipidemia)   OSA (obstructive sleep apnea)   CKD (chronic kidney disease) stage 3, GFR 30-59 ml/min (HCC)  Discharge Instructions  Allergies as of 01/04/2019   No Known Allergies     Medication List    STOP taking these medications   lisinopril 40 MG tablet Commonly known as: ZESTRIL     TAKE these medications   amLODipine 10 MG tablet Commonly known as: NORVASC Take 1 tablet (10 mg total) by mouth daily.   aspirin 81 MG chewable tablet Chew 1 tablet (81 mg total) by mouth 2 (two) times daily with a meal.   atorvastatin 40 MG tablet Commonly known as: LIPITOR Take 1 tablet (40 mg total) by mouth daily at 6 PM. What changed: when to take this   carvedilol 25 MG tablet Commonly known as: COREG Take 1 tablet (25 mg total) by mouth 2 (two) times daily with a meal.   clopidogrel 75 MG tablet Commonly known as: PLAVIX Take 1 tablet (75 mg total) by mouth daily. Aspirin and Plavix for 3 months, after that-stop Aspirin-take Plavix alone What changed: additional instructions   Dexcom G6 Receiver Devi 1 Device by Does not apply route as directed.   Dexcom G6 Sensor Misc 3 Devices by Does not apply route as directed.   Dexcom G6 Transmitter Misc 1 Device by Does not apply route as directed.   hydrALAZINE 10 MG tablet Commonly known as: APRESOLINE Take 1 tablet (10 mg total) by mouth every 8 (eight) hours.   HYDROcodone-acetaminophen 5-325 MG tablet Commonly known as: NORCO/VICODIN Take 1 tablet by mouth every 4 (four) hours as needed for moderate pain (pain score 4-6).   insulin lispro 100 UNIT/ML injection Commonly known as: HumaLOG Max  daily dose of 100 units via pump DX E10.59 (vials)   meclizine 25 MG tablet Commonly known as: ANTIVERT TAKE 1 TABLET BY MOUTH THREE TIMES DAILY AS NEEDED FOR DIZZINESS What changed: See the new instructions.   pantoprazole 40 MG tablet Commonly known as: PROTONIX Take 1 tablet (40 mg total) by mouth daily.   tamsulosin 0.4 MG Caps capsule Commonly known as: FLOMAX Take 1 capsule (0.4 mg total) by mouth daily after breakfast. Start taking on: January 05, 2019   Vitamin D (Ergocalciferol) 1.25 MG (50000 UT) Caps capsule Commonly known as: DRISDOL Take 1 capsule (50,000 Units total) by mouth every 7 (seven) days.      Follow-up Information    [name], MD. Schedule an appointment as soon as possible for a visit in 1 week(s).   Specialty: Family Medicine Contact information: [place] [address] [phone]        Rod Can, MD. Schedule an appointment as soon as possible for a visit in 2 week(s).   Specialty: Orthopedic Surgery Contact information: [address] [place] [address] [phone]  Consultations:  Orthopedic surgery   Procedures/Studies:  THA   Dg Chest 1 View  Result Date: 12/31/2018 CLINICAL DATA:  Fall with chest pain EXAM: CHEST  1 VIEW COMPARISON:  01/19/2015 FINDINGS: The heart size and mediastinal contours are within normal limits. Both lungs are clear. The visualized skeletal structures are unremarkable. IMPRESSION: No active disease. Electronically Signed   By: Donavan Foil M.D.   On: 12/31/2018 21:04   Pelvis Portable  Result Date: 01/01/2019 CLINICAL DATA:  Status post total left hip replacement EXAM: PORTABLE PELVIS 1-2 VIEWS COMPARISON:  January 01, 2019 FINDINGS: The patient is status post left total hip arthroplasty. No periprosthetic lucency or fracture is identified. Overlying soft tissue swelling subcutaneous emphysema. IMPRESSION: Status post left total  hip arthroplasty without complication. Electronically Signed   By: Prudencio Pair M.D.   On: 01/01/2019 19:36   Ct Hip Left Wo Contrast  Result Date: 12/31/2018 CLINICAL DATA:  Acute pain due to trauma EXAM: CT OF THE LEFT HIP WITHOUT CONTRAST TECHNIQUE: Multidetector CT imaging of the left hip was performed according to the standard protocol. Multiplanar CT image reconstructions were also generated. COMPARISON:  X-ray from same day. FINDINGS: Bones/Joint/Cartilage Again noted is an angulated and impacted subcapital/transcervical fracture of the proximal left femur. There is no dislocation. Ligaments Suboptimally assessed by CT. Muscles and Tendons Unremarkable Soft tissues The urinary bladder is significantly distended. The prostate gland is enlarged. IMPRESSION: 1. Acute fracture of the proximal left femur as detailed above. 2. Significantly distended urinary bladder. Prostatomegaly is noted. Electronically Signed   By: Constance Holster M.D.   On: 12/31/2018 22:29   Dg Knee Left Port  Result Date: 01/01/2019 CLINICAL DATA:  Femoral neck fracture. Preop. EXAM: PORTABLE LEFT KNEE - 1-2 VIEW COMPARISON:  None. FINDINGS: No evidence of fracture, dislocation, or joint effusion. Mild patellofemoral spurring. Minimal medial tibiofemoral joint space narrowing. There is chondrocalcinosis. Soft tissues are unremarkable. IMPRESSION: 1. No acute findings.  Mild degenerative change. 2. Chondrocalcinosis. Electronically Signed   By: Keith Rake M.D.   On: 01/01/2019 00:15   Dg C-arm 1-60 Min  Result Date: 01/01/2019 CLINICAL DATA:  Left hip replacement EXAM: OPERATIVE left HIP (WITH PELVIS IF PERFORMED) 2 VIEWS TECHNIQUE: Fluoroscopic spot image(s) were submitted for interpretation post-operatively. COMPARISON:  12/31/2018 FINDINGS: Two low resolution intraoperative spot views of the left hip. Total fluoroscopy time was 26 seconds. The images demonstrate a left hip replacement with normal alignment IMPRESSION:  Intraoperative fluoroscopic assistance provided during left hip replacement surgery Electronically Signed   By: Donavan Foil M.D.   On: 01/01/2019 18:59   Dg Hip Operative Unilat W Or W/o Pelvis Left  Result Date: 01/01/2019 CLINICAL DATA:  Left hip replacement EXAM: OPERATIVE left HIP (WITH PELVIS IF PERFORMED) 2 VIEWS TECHNIQUE: Fluoroscopic spot image(s) were submitted for interpretation post-operatively. COMPARISON:  12/31/2018 FINDINGS: Two low resolution intraoperative spot views of the left hip. Total fluoroscopy time was 26 seconds. The images demonstrate a left hip replacement with normal alignment IMPRESSION: Intraoperative fluoroscopic assistance provided during left hip replacement surgery Electronically Signed   By: Donavan Foil M.D.   On: 01/01/2019 18:59   Dg Hip Unilat W Or W/o Pelvis 2-3 Views Left  Result Date: 12/31/2018 CLINICAL DATA:  Left hip pain after fall. Tripped over cord in driveway. EXAM: DG HIP (WITH OR WITHOUT PELVIS) 2-3V LEFT COMPARISON:  None. FINDINGS: Minimally displaced left femoral neck fracture with mild angulation. Femoral head remains seated. Pubic rami are intact. Pubic symphysis  and sacroiliac joints are congruent. Mild underlying hip osteoarthritis. IMPRESSION: Minimally displaced and angulated left femoral neck fracture. Electronically Signed   By: Keith Rake M.D.   On: 12/31/2018 19:21     Subjective: - no chest pain, shortness of breath, no abdominal pain, nausea or vomiting.   Discharge Exam: BP (!) 151/68 (BP Location: Right Arm) Comment: nurse notified  Pulse 96   Temp 98.6 F (37 C) (Oral)   Resp 18   Ht 5\' 8"  (1.727 m)   Wt 90.7 kg   SpO2 96%   BMI 30.40 kg/m   General: Pt is alert, awake, not in acute distress Cardiovascular: RRR, S1/S2 +, no rubs, no gallops Respiratory: CTA bilaterally, no wheezing, no rhonchi Abdominal: Soft, NT, ND, bowel sounds + Extremities: no edema, no cyanosis    The results of significant  diagnostics from this hospitalization (including imaging, microbiology, ancillary and laboratory) are listed below for reference.     Microbiology: Recent Results (from the past 240 hour(s))  SARS CORONAVIRUS 2 (TAT 6-24 HRS) Nasopharyngeal Nasopharyngeal Swab     Status: None   Collection Time: 12/31/18  9:07 PM   Specimen: Nasopharyngeal Swab  Result Value Ref Range Status   SARS Coronavirus 2 NEGATIVE NEGATIVE Final    Comment: (NOTE) SARS-CoV-2 target nucleic acids are NOT DETECTED. The SARS-CoV-2 RNA is generally detectable in upper and lower respiratory specimens during the acute phase of infection. Negative results do not preclude SARS-CoV-2 infection, do not rule out co-infections with other pathogens, and should not be used as the sole basis for treatment or other patient management decisions. Negative results must be combined with clinical observations, patient history, and epidemiological information. The expected result is Negative. Fact Sheet for Patients: SugarRoll.be Fact Sheet for Healthcare Providers: https://www.woods-mathews.com/ This test is not yet approved or cleared by the [place] FDA and  has been authorized for detection and/or diagnosis of SARS-CoV-2 by FDA under an Emergency Use Authorization (EUA). This EUA will remain  in effect (meaning this test can be used) for the duration of the COVID-19 declaration under Section 56 4(b)(1) of the Act, 21 U.S.C. section 360bbb-3(b)(1), unless the authorization is terminated or revoked sooner. Performed at [facility], [place] [address] [place], [place] [address]   Surgical pcr screen     Status: None   Collection Time: 01/01/19 12:49 AM   Specimen: Nasal Mucosa; Nasal Swab  Result Value Ref Range Status   MRSA, PCR NEGATIVE NEGATIVE Final   Staphylococcus aureus NEGATIVE NEGATIVE Final    Comment: (NOTE) The Xpert SA Assay (FDA approved for NASAL specimens  in patients 48 years of age and older), is one component of a comprehensive surveillance program. It is not intended to diagnose infection nor to guide or monitor treatment. Performed at [facility], [place] [address], [place], [place] [address]      Labs: BNP (last 3 results) No results for input(s): BNP in the last 8760 hours. Basic Metabolic Panel: Recent Labs  Lab 12/31/18 2050 01/01/19 0434 01/02/19 0415 01/02/19 1856 01/03/19 0728  NA 142 138 138 127* 136  K 3.8 4.2 4.3 4.2 3.9  CL 108 107 111 101 108  CO2 22 19* 17* 17* 19*  GLUCOSE 122* 240* 300* 615* 285*  BUN 24* 23* 32* 53* 49*  CREATININE 2.14* 2.08* 2.41* 2.69* 2.34*  CALCIUM 8.9 8.3* 8.0* 7.8* 8.1*   Liver Function Tests: Recent Labs  Lab 12/31/18 2050 01/01/19 0434  AST  26 18  ALT 40 31  ALKPHOS 117 98  BILITOT 0.9 1.4*  PROT 6.6 5.7*  ALBUMIN 3.9 3.2*   No results for input(s): LIPASE, AMYLASE in the last 168 hours. No results for input(s): AMMONIA in the last 168 hours. CBC: Recent Labs  Lab 12/31/18 2050 01/01/19 0434 01/02/19 0415 01/03/19 0728  WBC 11.7* 10.1 11.9* 9.9  HGB 13.3 11.6* 8.9* 8.3*  HCT 39.7 33.7* 27.2* 24.0*  MCV 88.2 87.3 89.2 85.4  PLT 130* 115* 110* 102*   Cardiac Enzymes: No results for input(s): CKTOTAL, CKMB, CKMBINDEX, TROPONINI in the last 168 hours. BNP: Invalid input(s): POCBNP CBG: Recent Labs  Lab 01/03/19 1129 01/03/19 2104 01/04/19 0117 01/04/19 0432 01/04/19 0752  GLUCAP 330* 195* 124* 201* 175*   D-Dimer No results for input(s): DDIMER in the last 72 hours. Hgb A1c No results for input(s): HGBA1C in the last 72 hours. Lipid Profile No results for input(s): CHOL, HDL, LDLCALC, TRIG, CHOLHDL, LDLDIRECT in the last 72 hours. Thyroid function studies No results for input(s): TSH, T4TOTAL, T3FREE, THYROIDAB in the last 72 hours.  Invalid input(s): FREET3 Anemia work up No results for input(s): VITAMINB12, FOLATE, FERRITIN, TIBC, IRON,  RETICCTPCT in the last 72 hours. Urinalysis    Component Value Date/Time   COLORURINE YELLOW 10/28/2018 0839   APPEARANCEUR CLEAR 10/28/2018 0839   LABSPEC 1.015 10/28/2018 0839   PHURINE 5.5 10/28/2018 0839   GLUCOSEU NEGATIVE 10/28/2018 0839   HGBUR NEGATIVE 10/28/2018 0839   BILIRUBINUR NEGATIVE 10/28/2018 [place] 10/28/2018 0839   PROTEINUR 30 (A) 01/19/2015 2013   UROBILINOGEN 0.2 10/28/2018 0839   NITRITE NEGATIVE 10/28/2018 0839   LEUKOCYTESUR NEGATIVE 10/28/2018 0839   Sepsis Labs Invalid input(s): PROCALCITONIN,  WBC,  LACTICIDVEN  FURTHER DISCHARGE INSTRUCTIONS:   Get Medicines reviewed and adjusted: Please take all your medications with you for your next visit with your Primary MD   Laboratory/radiological data: Please request your Primary MD to go over all hospital tests and procedure/radiological results at the follow up, please ask your Primary MD to get all Hospital records sent to his/her office.   In some cases, they will be blood work, cultures and biopsy results pending at the time of your discharge. Please request that your primary care M.D. goes through all the records of your hospital data and follows up on these results.   Also Note the following: If you experience worsening of your admission symptoms, develop shortness of breath, life threatening emergency, suicidal or homicidal thoughts you must seek medical attention immediately by calling 911 or calling your MD immediately  if symptoms less severe.   You must read complete instructions/literature along with all the possible adverse reactions/side effects for all the Medicines you take and that have been prescribed to you. Take any new Medicines after you have completely understood and accpet all the possible adverse reactions/side effects.    Do not drive when taking Pain medications or sleeping medications (Benzodaizepines)   Do not take more than prescribed Pain, Sleep and Anxiety  Medications. It is not advisable to combine anxiety,sleep and pain medications without talking with your primary care practitioner   Special Instructions: If you have smoked or chewed Tobacco  in the last 2 yrs please stop smoking, stop any regular Alcohol  and or any Recreational drug use.   Wear Seat belts while driving.   Please note: You were cared for by a hospitalist during your hospital stay. Once you are discharged, your  primary care physician will handle any further medical issues. Please note that NO REFILLS for any discharge medications will be authorized once you are discharged, as it is imperative that you return to your primary care physician (or establish a relationship with a primary care physician if you do not have one) for your post hospital discharge needs so that they can reassess your need for medications and monitor your lab values.  Time coordinating discharge: 40 minutes  SIGNED:  Marzetta Board, MD, PhD 01/04/2019, 11:33 AM

## 2019-01-04 NOTE — Plan of Care (Signed)

## 2019-01-04 NOTE — Progress Notes (Signed)
Subjective: 3 Days Post-Op Procedure(s) (LRB): ANTERIOR APPROACH total hip ARTHROPLASTY (Left) Patient reports pain as 1 on 0-10 scale and mild.  He states that he is doing well today. He has no complaints or concerns. It was discussed with the patient that he will  Most likely be going home today dependant on his sugars.   Objective: Vital signs in last 24 hours: Temp:  [98.5 F (36.9 C)-99.4 F (37.4 C)] 98.6 F (37 C) (09/07 0755) Pulse Rate:  [89-97] 96 (09/07 0755) Resp:  [14-18] 18 (09/07 0755) BP: (151-164)/(68-71) 151/68 (09/07 0755) SpO2:  [94 %-98 %] 96 % (09/07 0755)  Intake/Output from previous day: 09/06 0701 - 09/07 0700 In: 834 [P.O.:834] Out: -  Intake/Output this shift: No intake/output data recorded.  Recent Labs    01/02/19 0415 01/03/19 0728  HGB 8.9* 8.3*   Recent Labs    01/02/19 0415 01/03/19 0728  WBC 11.9* 9.9  RBC 3.05* 2.81*  HCT 27.2* 24.0*  PLT 110* 102*   Recent Labs    01/02/19 1856 01/03/19 0728  NA 127* 136  K 4.2 3.9  CL 101 108  CO2 17* 19*  BUN 53* 49*  CREATININE 2.69* 2.34*  GLUCOSE 615* 285*  CALCIUM 7.8* 8.1*   No results for input(s): LABPT, INR in the last 72 hours.  Neurologically intact Neurovascular intact Intact pulses distally Dorsiflexion/Plantar flexion intact Incision: dressing C/D/I Compartment soft Patient is able to straight leg raise. He is able to flex and extend knee with no pain. Dorsiflex and plantar flex ankle with no pain. Calf is supple. Negative Homans.    Assessment/Plan: 3 Days Post-Op Procedure(s) (LRB): ANTERIOR APPROACH total hip ARTHROPLASTY (Left) Advance diet Up with therapy  DVT Prophylaxis- Aspirin Weight bearing as tolerated  Plan is to go home today. Just waiting on Discharge clearance per medicine. From orthopedic standpoint patient is okay to be discharged. Medications have been sent to pharmacy. Will follow up in office with Dr. Lyla Glassing in 2 weeks.      Adrian Neal  Adrian Neal 01/04/2019, 11:04 AM

## 2019-01-04 NOTE — Discharge Instructions (Signed)
Follow with [name], MD in 5-7 days  Please get a complete blood count and chemistry panel checked by your Primary MD at your next visit, and again as instructed by your Primary MD. Please get your medications reviewed and adjusted by your Primary MD.  Please request your Primary MD to go over all Hospital Tests and Procedure/Radiological results at the follow up, please get all Hospital records sent to your Prim MD by signing hospital release before you go home.  In some cases, there will be blood work, cultures and biopsy results pending at the time of your discharge. Please request that your primary care M.D. goes through all the records of your hospital data and follows up on these results.  If you had Pneumonia of Lung problems at the Hospital: Please get a 2 view Chest X ray done in 6-8 weeks after hospital discharge or sooner if instructed by your Primary MD.  If you have Congestive Heart Failure: Please call your Cardiologist or Primary MD anytime you have any of the following symptoms:  1) 3 pound weight gain in 24 hours or 5 pounds in 1 week  2) shortness of breath, with or without a dry hacking cough  3) swelling in the hands, feet or stomach  4) if you have to sleep on extra pillows at night in order to breathe  Follow cardiac low salt diet and 1.5 lit/day fluid restriction.  If you have diabetes Accuchecks 4 times/day, Once in AM empty stomach and then before each meal. Log in all results and show them to your primary doctor at your next visit. If any glucose reading is under 80 or above 300 call your primary MD immediately.  If you have Seizure/Convulsions/Epilepsy: Please do not drive, operate heavy machinery, participate in activities at heights or participate in high speed sports until you have seen by Primary MD or a Neurologist and advised to do so again. Per [facility] statutes, patients with seizures are not allowed to drive until they have been  seizure-free for six months.  Use caution when using heavy equipment or power tools. Avoid working on ladders or at heights. Take showers instead of baths. Ensure the water temperature is not too high on the home water heater. Do not go swimming alone. Do not lock yourself in a room alone (i.e. bathroom). When caring for infants or small children, sit down when holding, feeding, or changing them to minimize risk of injury to the child in the event you have a seizure. Maintain good sleep hygiene. Avoid alcohol.   If you had Gastrointestinal Bleeding: Please ask your Primary MD to check a complete blood count within one week of discharge or at your next visit. Your endoscopic/colonoscopic biopsies that are pending at the time of discharge, will also need to followed by your Primary MD.  Get Medicines reviewed and adjusted. Please take all your medications with you for your next visit with your Primary MD  Please request your Primary MD to go over all hospital tests and procedure/radiological results at the follow up, please ask your Primary MD to get all Hospital records sent to his/her office.  If you experience worsening of your admission symptoms, develop shortness of breath, life threatening emergency, suicidal or homicidal thoughts you must seek medical attention immediately by calling 911 or calling your MD immediately  if symptoms less severe.  You must read complete instructions/literature along with all the possible adverse reactions/side effects for all the Medicines you  take and that have been prescribed to you. Take any new Medicines after you have completely understood and accpet all the possible adverse reactions/side effects.   Do not drive or operate heavy machinery when taking Pain medications.   Do not take more than prescribed Pain, Sleep and Anxiety Medications  Special Instructions: If you have smoked or chewed Tobacco  in the last 2 yrs please stop smoking, stop any regular  Alcohol  and or any Recreational drug use.  Wear Seat belts while driving.  Please note You were cared for by a hospitalist during your hospital stay. If you have any questions about your discharge medications or the care you received while you were in the hospital after you are discharged, you can call the unit and asked to speak with the hospitalist on call if the hospitalist that took care of you is not available. Once you are discharged, your primary care physician will handle any further medical issues. Please note that NO REFILLS for any discharge medications will be authorized once you are discharged, as it is imperative that you return to your primary care physician (or establish a relationship with a primary care physician if you do not have one) for your aftercare needs so that they can reassess your need for medications and monitor your lab values.  You can reach the hospitalist office at phone [phone] or fax [phone]   If you do not have a primary care physician, you can call [phone] for a physician referral.  Activity: As tolerated with Full fall precautions use walker/cane & assistance as needed    Diet: diabetic  Disposition Home

## 2019-01-04 NOTE — Progress Notes (Signed)
Report received from night nurse

## 2019-01-04 NOTE — Progress Notes (Signed)
Inpatient Diabetes Program Recommendations  AACE/ADA: New Consensus Statement on Inpatient Glycemic Control (2015)  Target Ranges:  Prepandial:   less than 140 mg/dL      Peak postprandial:   less than 180 mg/dL (1-2 hours)      Critically ill patients:  140 - 180 mg/dL   Lab Results  Component Value Date   GLUCAP 175 (H) 01/04/2019   HGBA1C 7.2 (H) 01/01/2019    Review of Glycemic Control  Diabetes history: DM1 Outpatient Diabetes medications: T slim insulin pump Current orders for Inpatient glycemic control: T Slim insulin pump  Inpatient Diabetes Program Recommendations:   Noted patient is back on insulin pump and CBGs good. Spoke with patient and states he is doing well and no needs @ this time.  Thank you, Nani Gasser. Amaree Leeper, RN, MSN, CDE  Diabetes Coordinator Inpatient Glycemic Control Team Team Pager [phone] (8am-5pm) 01/04/2019 9:42 AM

## 2019-01-05 ENCOUNTER — Encounter (HOSPITAL_COMMUNITY): Payer: Self-pay | Admitting: Orthopedic Surgery

## 2019-01-05 DIAGNOSIS — N183 Chronic kidney disease, stage 3 (moderate): Secondary | ICD-10-CM | POA: Diagnosis not present

## 2019-01-05 DIAGNOSIS — I69354 Hemiplegia and hemiparesis following cerebral infarction affecting left non-dominant side: Secondary | ICD-10-CM | POA: Diagnosis not present

## 2019-01-05 DIAGNOSIS — N400 Enlarged prostate without lower urinary tract symptoms: Secondary | ICD-10-CM | POA: Diagnosis not present

## 2019-01-05 DIAGNOSIS — E1022 Type 1 diabetes mellitus with diabetic chronic kidney disease: Secondary | ICD-10-CM | POA: Diagnosis not present

## 2019-01-05 DIAGNOSIS — E785 Hyperlipidemia, unspecified: Secondary | ICD-10-CM | POA: Diagnosis not present

## 2019-01-05 DIAGNOSIS — Z7902 Long term (current) use of antithrombotics/antiplatelets: Secondary | ICD-10-CM | POA: Diagnosis not present

## 2019-01-05 DIAGNOSIS — S72002D Fracture of unspecified part of neck of left femur, subsequent encounter for closed fracture with routine healing: Secondary | ICD-10-CM | POA: Diagnosis not present

## 2019-01-05 DIAGNOSIS — Z9181 History of falling: Secondary | ICD-10-CM | POA: Diagnosis not present

## 2019-01-05 DIAGNOSIS — Z7982 Long term (current) use of aspirin: Secondary | ICD-10-CM | POA: Diagnosis not present

## 2019-01-05 DIAGNOSIS — E10319 Type 1 diabetes mellitus with unspecified diabetic retinopathy without macular edema: Secondary | ICD-10-CM | POA: Diagnosis not present

## 2019-01-05 DIAGNOSIS — I129 Hypertensive chronic kidney disease with stage 1 through stage 4 chronic kidney disease, or unspecified chronic kidney disease: Secondary | ICD-10-CM | POA: Diagnosis not present

## 2019-01-05 DIAGNOSIS — Z96642 Presence of left artificial hip joint: Secondary | ICD-10-CM | POA: Diagnosis not present

## 2019-01-05 DIAGNOSIS — G4733 Obstructive sleep apnea (adult) (pediatric): Secondary | ICD-10-CM | POA: Diagnosis not present

## 2019-01-05 LAB — GLUCOSE, CAPILLARY: Glucose-Capillary: 253 mg/dL — ABNORMAL HIGH (ref 70–99)

## 2019-01-07 ENCOUNTER — Encounter (INDEPENDENT_AMBULATORY_CARE_PROVIDER_SITE_OTHER): Payer: PPO | Admitting: Ophthalmology

## 2019-01-07 NOTE — Progress Notes (Signed)
Received call that patient had questions about DME which had not been delivered. Spoke with [facility] who contacted Medical laboratory scientific officer with MD office. Patient has PT visit scheduled for tomorrow at 10:30. Spoke with patient via telephone who stated that he did go and buy a walker. Patient asked about hip kit - request passed on to [facility] to relay to Medical laboratory scientific officer.   Manya Silvas, RN CM [facility] [phone]

## 2019-01-08 ENCOUNTER — Ambulatory Visit (INDEPENDENT_AMBULATORY_CARE_PROVIDER_SITE_OTHER): Payer: PPO | Admitting: Family Medicine

## 2019-01-08 ENCOUNTER — Encounter: Payer: Self-pay | Admitting: Family Medicine

## 2019-01-08 ENCOUNTER — Other Ambulatory Visit: Payer: Self-pay

## 2019-01-08 VITALS — BP 126/60 | HR 98 | Ht 68.0 in

## 2019-01-08 DIAGNOSIS — Z23 Encounter for immunization: Secondary | ICD-10-CM | POA: Diagnosis not present

## 2019-01-08 DIAGNOSIS — Z8781 Personal history of (healed) traumatic fracture: Secondary | ICD-10-CM

## 2019-01-08 DIAGNOSIS — R600 Localized edema: Secondary | ICD-10-CM | POA: Insufficient documentation

## 2019-01-08 DIAGNOSIS — I10 Essential (primary) hypertension: Secondary | ICD-10-CM | POA: Diagnosis not present

## 2019-01-08 DIAGNOSIS — N179 Acute kidney failure, unspecified: Secondary | ICD-10-CM | POA: Diagnosis not present

## 2019-01-08 DIAGNOSIS — D62 Acute posthemorrhagic anemia: Secondary | ICD-10-CM | POA: Diagnosis not present

## 2019-01-08 DIAGNOSIS — S7292XA Unspecified fracture of left femur, initial encounter for closed fracture: Secondary | ICD-10-CM | POA: Diagnosis not present

## 2019-01-08 DIAGNOSIS — Z09 Encounter for follow-up examination after completed treatment for conditions other than malignant neoplasm: Secondary | ICD-10-CM | POA: Diagnosis not present

## 2019-01-08 MED ORDER — FUROSEMIDE 20 MG PO TABS: 20.0000 mg | ORAL_TABLET | Freq: Every day | ORAL | 0 refills | Status: DC

## 2019-01-08 NOTE — Progress Notes (Signed)
Established Patient Office Visit  Subjective:  Patient ID: Adrian Neal, male    DOB: 03-09-1966  Age: 53 y.o. MRN: [id number]  CC:  Chief Complaint  Patient presents with  . Follow-up    HPI Adrian Neal presents for follow-up status post hospitalization for acute femur fracture.  Patient had slipped or tripped falling from a standing position and landed on his left side.  He is status post total hip replacement.  During hospitalization he was diagnosed with acute kidney injury, urine retention.  He was catheterized and given Flomax after which is urinary retention improved.  Marked decrease in hemoglobin was noted from 11.6-8.4.  He is here today with pain and swelling in his lower extremities.  He denies shortness of breath chest pain.  Sleeps on one pillow.  History of LVH from echocardiogram in 2016 with a preserved ejection fraction of 65%.  Past Medical History:  Diagnosis Date  . Cataract 1994   right eye  . Chronic kidney disease   . Diabetes mellitus without complication ([place])    diagnosed at age 74  . Eye problems   . Heart murmur 1996  . High cholesterol    patient denies but take preventative medicine  . Hypertension   . 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 Sleep apnea    currently not one but looking to get one   . Stroke [facility]) 2016    Past Surgical History:  Procedure Laterality Date  . ANTERIOR APPROACH HEMI HIP ARTHROPLASTY Left 01/01/2019   Procedure: ANTERIOR APPROACH total hip ARTHROPLASTY;  Surgeon: Rod Can, MD;  Location: [place];  Service: Orthopedics;  Laterality: Left;  . CATARACT EXTRACTION Right 1994  . CATARACT EXTRACTION W/ INTRAOCULAR LENS IMPLANT  1994  . EYE SURGERY Right 1991   vitrectomy  . EYE SURGERY Right 1992   scar tissue removed from retina    Family History  Problem Relation Age of Onset  . Hypertension Mother   . Hyperlipidemia Mother   . Hyperlipidemia Father   . Hypertension Father    . Diabetes Father   . Kidney disease Father        had a kidney transplant   . Stroke Brother   . Diabetes Brother   . Pulmonary fibrosis Paternal Grandmother   . Lung cancer Paternal Grandfather   . Diabetes Daughter   . Colon cancer Neg Hx   . Esophageal cancer Neg Hx   . Rectal cancer Neg Hx   . Stomach cancer Neg Hx     Social History   Socioeconomic History  . Marital status: Married    Spouse name: Not on file  . Number of children: 3  . Years of education: Not on file  . Highest education level: Not on file  Occupational History  . Occupation: Disabled  Social Needs  . Financial resource strain: Not on file  . Food insecurity    Worry: Not on file    Inability: Not on file  . Transportation needs    Medical: Not on file    Non-medical: Not on file  Tobacco Use  . Smoking status: Former Smoker    Packs/day: 1.00    Years: 6.00    Pack years: 6.00    Quit date: 04/30/1991    Years since quitting: 27.7  . Smokeless tobacco: Never Used  Substance and Sexual Activity  . Alcohol use: No  . Drug use: Never  .  Sexual activity: Not on file  Lifestyle  . Physical activity    Days per week: Not on file    Minutes per session: Not on file  . Stress: Not on file  Relationships  . Social Herbalist on phone: Not on file    Gets together: Not on file    Attends religious service: Not on file    Active member of club or organization: Not on file    Attends meetings of clubs or organizations: Not on file    Relationship status: Not on file  . Intimate partner violence    Fear of current or ex partner: Not on file    Emotionally abused: Not on file    Physically abused: Not on file    Forced sexual activity: Not on file  Other Topics Concern  . Not on file  Social History Narrative  . Not on file    Outpatient Medications Prior to Visit  Medication Sig Dispense Refill  . amLODipine (NORVASC) 10 MG tablet Take 1 tablet (10 mg total) by mouth daily.  90 tablet 0  . aspirin 81 MG chewable tablet Chew 1 tablet (81 mg total) by mouth 2 (two) times daily with a meal. 84 tablet 0  . atorvastatin (LIPITOR) 40 MG tablet Take 1 tablet (40 mg total) by mouth daily at 6 PM. (Patient taking differently: Take 40 mg by mouth daily. ) 90 tablet 0  . carvedilol (COREG) 25 MG tablet Take 1 tablet (25 mg total) by mouth 2 (two) times daily with a meal. 60 tablet 2  . clopidogrel (PLAVIX) 75 MG tablet Take 1 tablet (75 mg total) by mouth daily. Aspirin and Plavix for 3 months, after that-stop Aspirin-take Plavix alone (Patient taking differently: Take 75 mg by mouth daily. ) 90 tablet 1  . Continuous Blood Gluc Receiver (DEXCOM G6 RECEIVER) DEVI 1 Device by Does not apply route as directed. 1 Device 0  . Continuous Blood Gluc Sensor (DEXCOM G6 SENSOR) MISC 3 Devices by Does not apply route as directed. 3 each 6  . Continuous Blood Gluc Transmit (DEXCOM G6 TRANSMITTER) MISC 1 Device by Does not apply route as directed. 1 each 6  . hydrALAZINE (APRESOLINE) 10 MG tablet Take 1 tablet (10 mg total) by mouth every 8 (eight) hours. 90 tablet 1  . HYDROcodone-acetaminophen (NORCO/VICODIN) 5-325 MG tablet Take 1 tablet by mouth every 4 (four) hours as needed for moderate pain (pain score 4-6). 42 tablet 0  . insulin lispro (HUMALOG) 100 UNIT/ML injection Max daily dose of 100 units via pump DX E10.59 (vials) 90 mL 4  . meclizine (ANTIVERT) 25 MG tablet TAKE 1 TABLET BY MOUTH THREE TIMES DAILY AS NEEDED FOR DIZZINESS (Patient taking differently: Take 25 mg by mouth 3 (three) times daily as needed for dizziness. ) 30 tablet 0  . pantoprazole (PROTONIX) 40 MG tablet Take 1 tablet (40 mg total) by mouth daily. 90 tablet 1  . tamsulosin (FLOMAX) 0.4 MG CAPS capsule Take 1 capsule (0.4 mg total) by mouth daily after breakfast. 30 capsule 1  . Vitamin D, Ergocalciferol, (DRISDOL) 1.25 MG (50000 UT) CAPS capsule Take 1 capsule (50,000 Units total) by mouth every 7 (seven) days. 5  capsule 5   No facility-administered medications prior to visit.     No Known Allergies  ROS Review of Systems  Constitutional: Negative for chills, diaphoresis, fatigue, fever and unexpected weight change.  HENT: Negative.   Eyes: Negative for photophobia  and visual disturbance.  Respiratory: Negative.  Negative for chest tightness and shortness of breath.   Cardiovascular: Negative.  Negative for chest pain.  Gastrointestinal: Negative.   Endocrine: Negative for polyphagia and polyuria.  Genitourinary: Negative for difficulty urinating, frequency and urgency.  Musculoskeletal: Positive for arthralgias.  Skin: Negative for pallor and rash.  Allergic/Immunologic: Negative for immunocompromised state.  Neurological: Negative for light-headedness and headaches.  Hematological: Does not bruise/bleed easily.  Psychiatric/Behavioral: Negative.       Objective:    Physical Exam  Constitutional: He is oriented to person, place, and time. He appears well-developed and well-nourished. No distress.  HENT:  Head: Normocephalic and atraumatic.  Right Ear: External ear normal.  Left Ear: External ear normal.  Mouth/Throat: Oropharynx is clear and moist. No oropharyngeal exudate.  Eyes: Pupils are equal, round, and reactive to light. Conjunctivae are normal. Right eye exhibits no discharge. Left eye exhibits no discharge. No scleral icterus.  Neck: No JVD present. No tracheal deviation present. No thyromegaly present.  Cardiovascular: Normal rate, regular rhythm and normal heart sounds.  Pulmonary/Chest: Effort normal and breath sounds normal. No stridor. No respiratory distress. He has no wheezes. He has no rales.  Abdominal: Bowel sounds are normal.  Musculoskeletal:        General: Edema (2 plus) present.  Lymphadenopathy:    He has no cervical adenopathy.  Neurological: He is alert and oriented to person, place, and time.  Skin: Skin is warm and dry. He is not diaphoretic.   Psychiatric: He has a normal mood and affect. His behavior is normal.    BP 126/60   Pulse 98   Ht 5\' 8"  (1.727 m)   SpO2 98%   BMI 30.40 kg/m  Wt Readings from Last 3 Encounters:  01/01/19 199 lb 15.3 oz (90.7 kg)  12/17/18 199 lb 6.4 oz (90.4 kg)  12/03/18 199 lb 9.6 oz (90.5 kg)   BP Readings from Last 3 Encounters:  01/08/19 126/60  01/04/19 (!) 141/66  12/17/18 (!) 124/58   Guideline developer:  UpToDate (see UpToDate for funding source) Date Released: June 2014  Health Maintenance Due  Topic Date Due  . OPHTHALMOLOGY EXAM  11/22/1975  . INFLUENZA VACCINE  11/28/2018    There are no preventive care reminders to display for this patient.  Lab Results  Component Value Date   TSH 4.41 06/15/2018   Lab Results  Component Value Date   WBC 9.9 01/03/2019   HGB 8.3 (L) 01/03/2019   HCT 24.0 (L) 01/03/2019   MCV 85.4 01/03/2019   PLT 102 (L) 01/03/2019   Lab Results  Component Value Date   NA 136 01/03/2019   K 3.9 01/03/2019   CO2 19 (L) 01/03/2019   GLUCOSE 285 (H) 01/03/2019   BUN 49 (H) 01/03/2019   CREATININE 2.34 (H) 01/03/2019   BILITOT 1.4 (H) 01/01/2019   ALKPHOS 98 01/01/2019   AST 18 01/01/2019   ALT 31 01/01/2019   PROT 5.7 (L) 01/01/2019   ALBUMIN 3.2 (L) 01/01/2019   CALCIUM 8.1 (L) 01/03/2019   ANIONGAP 9 01/03/2019   GFR 40.19 (L) 10/28/2018   Lab Results  Component Value Date   CHOL 107 10/28/2018   Lab Results  Component Value Date   HDL 35.10 (L) 10/28/2018   Lab Results  Component Value Date   LDLCALC 60 10/28/2018   Lab Results  Component Value Date   TRIG 58.0 10/28/2018   Lab Results  Component Value Date   CHOLHDL  3 10/28/2018   Lab Results  Component Value Date   HGBA1C 7.2 (H) 01/01/2019      Assessment & Plan:   Problem List Items Addressed This Visit      Cardiovascular and Mediastinum   Essential hypertension - Primary   Relevant Medications   furosemide (LASIX) 20 MG tablet     Genitourinary    AKI (acute kidney injury) ([place])   Relevant Orders   CBC     Other   Acute blood loss anemia   Relevant Orders   Basic metabolic panel   History of femur fracture   Relevant Orders   DG Bone Density   Need for influenza vaccination   Relevant Orders   Flu Vaccine QUAD 36+ mos IM (Completed)   Edema of both legs   Relevant Medications   furosemide (LASIX) 20 MG tablet   Hospital discharge follow-up      Meds ordered this encounter  Medications  . furosemide (LASIX) 20 MG tablet    Sig: Take 1 tablet (20 mg total) by mouth daily.    Dispense:  30 tablet    Refill:  0    Follow-up: Return in about 1 week (around 01/15/2019).   Patient will have his Norvasc and take 5 mg daily for 5 days.  During this time he will take Lasix 20 mg daily.  Follow-up in 1 week.

## 2019-01-09 LAB — CBC
HCT: 23.9 % — ABNORMAL LOW (ref 38.5–50.0)
Hemoglobin: 7.9 g/dL — ABNORMAL LOW (ref 13.2–17.1)
MCH: 29 pg (ref 27.0–33.0)
MCHC: 33.1 g/dL (ref 32.0–36.0)
MCV: 87.9 fL (ref 80.0–100.0)
MPV: 12.4 fL (ref 7.5–12.5)
Platelets: 195 10*3/uL (ref 140–400)
RBC: 2.72 10*6/uL — ABNORMAL LOW (ref 4.20–5.80)
RDW: 13 % (ref 11.0–15.0)
WBC: 8.9 10*3/uL (ref 3.8–10.8)

## 2019-01-09 LAB — BASIC METABOLIC PANEL
BUN/Creatinine Ratio: 13 (calc) (ref 6–22)
BUN: 26 mg/dL — ABNORMAL HIGH (ref 7–25)
CO2: 22 mmol/L (ref 20–32)
Calcium: 8.3 mg/dL — ABNORMAL LOW (ref 8.6–10.3)
Chloride: 108 mmol/L (ref 98–110)
Creat: 2.07 mg/dL — ABNORMAL HIGH (ref 0.70–1.33)
Glucose, Bld: 186 mg/dL — ABNORMAL HIGH (ref 65–99)
Potassium: 4.5 mmol/L (ref 3.5–5.3)
Sodium: 138 mmol/L (ref 135–146)

## 2019-01-12 DIAGNOSIS — E109 Type 1 diabetes mellitus without complications: Secondary | ICD-10-CM | POA: Diagnosis not present

## 2019-01-13 ENCOUNTER — Ambulatory Visit (INDEPENDENT_AMBULATORY_CARE_PROVIDER_SITE_OTHER): Payer: PPO | Admitting: Ophthalmology

## 2019-01-13 ENCOUNTER — Other Ambulatory Visit: Payer: Self-pay

## 2019-01-13 ENCOUNTER — Encounter (INDEPENDENT_AMBULATORY_CARE_PROVIDER_SITE_OTHER): Payer: Self-pay | Admitting: Ophthalmology

## 2019-01-13 ENCOUNTER — Telehealth: Payer: Self-pay

## 2019-01-13 DIAGNOSIS — I129 Hypertensive chronic kidney disease with stage 1 through stage 4 chronic kidney disease, or unspecified chronic kidney disease: Secondary | ICD-10-CM | POA: Diagnosis not present

## 2019-01-13 DIAGNOSIS — N183 Chronic kidney disease, stage 3 (moderate): Secondary | ICD-10-CM | POA: Diagnosis not present

## 2019-01-13 DIAGNOSIS — E10319 Type 1 diabetes mellitus with unspecified diabetic retinopathy without macular edema: Secondary | ICD-10-CM | POA: Diagnosis not present

## 2019-01-13 DIAGNOSIS — Z7982 Long term (current) use of aspirin: Secondary | ICD-10-CM | POA: Diagnosis not present

## 2019-01-13 DIAGNOSIS — H35033 Hypertensive retinopathy, bilateral: Secondary | ICD-10-CM

## 2019-01-13 DIAGNOSIS — I10 Essential (primary) hypertension: Secondary | ICD-10-CM | POA: Diagnosis not present

## 2019-01-13 DIAGNOSIS — H3581 Retinal edema: Secondary | ICD-10-CM

## 2019-01-13 DIAGNOSIS — E103593 Type 1 diabetes mellitus with proliferative diabetic retinopathy without macular edema, bilateral: Secondary | ICD-10-CM

## 2019-01-13 DIAGNOSIS — N400 Enlarged prostate without lower urinary tract symptoms: Secondary | ICD-10-CM | POA: Diagnosis not present

## 2019-01-13 DIAGNOSIS — E785 Hyperlipidemia, unspecified: Secondary | ICD-10-CM | POA: Diagnosis not present

## 2019-01-13 DIAGNOSIS — E1022 Type 1 diabetes mellitus with diabetic chronic kidney disease: Secondary | ICD-10-CM | POA: Diagnosis not present

## 2019-01-13 DIAGNOSIS — Z9181 History of falling: Secondary | ICD-10-CM | POA: Diagnosis not present

## 2019-01-13 DIAGNOSIS — Z7902 Long term (current) use of antithrombotics/antiplatelets: Secondary | ICD-10-CM | POA: Diagnosis not present

## 2019-01-13 DIAGNOSIS — Z96642 Presence of left artificial hip joint: Secondary | ICD-10-CM | POA: Diagnosis not present

## 2019-01-13 DIAGNOSIS — S72002D Fracture of unspecified part of neck of left femur, subsequent encounter for closed fracture with routine healing: Secondary | ICD-10-CM | POA: Diagnosis not present

## 2019-01-13 DIAGNOSIS — Z961 Presence of intraocular lens: Secondary | ICD-10-CM | POA: Diagnosis not present

## 2019-01-13 DIAGNOSIS — I69354 Hemiplegia and hemiparesis following cerebral infarction affecting left non-dominant side: Secondary | ICD-10-CM | POA: Diagnosis not present

## 2019-01-13 DIAGNOSIS — H25812 Combined forms of age-related cataract, left eye: Secondary | ICD-10-CM | POA: Diagnosis not present

## 2019-01-13 DIAGNOSIS — S72009A Fracture of unspecified part of neck of unspecified femur, initial encounter for closed fracture: Secondary | ICD-10-CM | POA: Insufficient documentation

## 2019-01-13 DIAGNOSIS — G4733 Obstructive sleep apnea (adult) (pediatric): Secondary | ICD-10-CM | POA: Diagnosis not present

## 2019-01-13 NOTE — Progress Notes (Signed)
[facility] Note  01/13/2019     CHIEF COMPLAINT Patient presents for Retina Follow Up   HISTORY OF PRESENT ILLNESS: Adrian Neal is a 53 y.o. male who presents to the clinic today for:   HPI    Retina Follow Up    Patient presents with  Diabetic Retinopathy.  In both eyes.  This started months ago.  Severity is moderate.  Duration of months.  Since onset it is gradually worsening.  I, the attending physician,  performed the HPI with the patient and updated documentation appropriately.          Comments    BS:152 during todays visit Patient state his vision is a little worse since last visit.  He denies eye pain OU.  Patient denies any new or worsening floaters or fol OU.       Last edited by [name], MD on 01/13/2019  3:48 PM. (History)    pt states he fell 2 weeks ago and broke his hip, pt states he feels like the vision in both eyes has been down for about a month, pt states he was taken off lisinopril after his hip replacement sx due to a reaction with his kidneys   Referring physician: [name], MD [place],  [place] [address]  HISTORICAL INFORMATION:   Selected notes from the MEDICAL RECORD NUMBER Referred by Dr. [name] for DM exam LEE:  Ocular Hx-cataract OD, pseudo OS PMH-DM (type 1, A1C: 8.6, takes novolog), heart murmur, HLD, HTN, stroke    CURRENT MEDICATIONS: No current outpatient medications on file. (Ophthalmic Drugs)   No current facility-administered medications for this visit.  (Ophthalmic Drugs)   Current Outpatient Medications (Other)  Medication Sig  . amLODipine (NORVASC) 10 MG tablet Take 1 tablet (10 mg total) by mouth daily.  Marland Kitchen aspirin 81 MG chewable tablet Chew 1 tablet (81 mg total) by mouth 2 (two) times daily with a meal.  . atorvastatin (LIPITOR) 40 MG tablet Take 1 tablet (40 mg total) by mouth daily at 6 PM. (Patient taking differently: Take 40 mg by mouth  daily. )  . carvedilol (COREG) 25 MG tablet Take 1 tablet (25 mg total) by mouth 2 (two) times daily with a meal.  . clopidogrel (PLAVIX) 75 MG tablet Take 1 tablet (75 mg total) by mouth daily. Aspirin and Plavix for 3 months, after that-stop Aspirin-take Plavix alone (Patient taking differently: Take 75 mg by mouth daily. )  . Continuous Blood Gluc Receiver (DEXCOM G6 RECEIVER) DEVI 1 Device by Does not apply route as directed.  . Continuous Blood Gluc Sensor (DEXCOM G6 SENSOR) MISC 3 Devices by Does not apply route as directed.  . Continuous Blood Gluc Transmit (DEXCOM G6 TRANSMITTER) MISC 1 Device by Does not apply route as directed.  . furosemide (LASIX) 20 MG tablet Take 1 tablet (20 mg total) by mouth daily.  . hydrALAZINE (APRESOLINE) 10 MG tablet Take 1 tablet (10 mg total) by mouth every 8 (eight) hours.  Marland Kitchen HYDROcodone-acetaminophen (NORCO/VICODIN) 5-325 MG tablet Take 1 tablet by mouth every 4 (four) hours as needed for moderate pain (pain score 4-6).  Marland Kitchen insulin lispro (HUMALOG) 100 UNIT/ML injection Max daily dose of 100 units via pump DX E10.59 (vials)  . meclizine (ANTIVERT) 25 MG tablet TAKE 1 TABLET BY MOUTH THREE TIMES DAILY AS NEEDED FOR DIZZINESS (Patient taking differently: Take 25 mg by mouth 3 (three) times daily as needed for  dizziness. )  . pantoprazole (PROTONIX) 40 MG tablet Take 1 tablet (40 mg total) by mouth daily.  . tamsulosin (FLOMAX) 0.4 MG CAPS capsule Take 1 capsule (0.4 mg total) by mouth daily after breakfast.  . Vitamin D, Ergocalciferol, (DRISDOL) 1.25 MG (50000 UT) CAPS capsule Take 1 capsule (50,000 Units total) by mouth every 7 (seven) days.   No current facility-administered medications for this visit.  (Other)      REVIEW OF SYSTEMS: ROS    Positive for: Endocrine, Cardiovascular, Eyes   Negative for: Constitutional, Gastrointestinal, Neurological, Skin, Genitourinary, Musculoskeletal, HENT, Respiratory, Psychiatric, Allergic/Imm, Heme/Lymph   Last  edited by Doneen Poisson on 01/13/2019  3:04 PM. (History)       ALLERGIES No Known Allergies  PAST MEDICAL HISTORY Past Medical History:  Diagnosis Date  . Cataract 1994   right eye  . Chronic kidney disease   . Diabetes mellitus without complication ([place])    diagnosed at age 29  . Eye problems   . Heart murmur 1996  . High cholesterol    patient denies but take preventative medicine  . Hypertension   . Retinopathy due to secondary diabetes mellitus ([place])    right  . Sleep apnea    currently not one but looking to get one   . Stroke [facility]) 2016   Past Surgical History:  Procedure Laterality Date  . ANTERIOR APPROACH HEMI HIP ARTHROPLASTY Left 01/01/2019   Procedure: ANTERIOR APPROACH total hip ARTHROPLASTY;  Surgeon: Rod Can, MD;  Location: [place];  Service: Orthopedics;  Laterality: Left;  . CATARACT EXTRACTION Right 1994  . CATARACT EXTRACTION W/ INTRAOCULAR LENS IMPLANT  1994  . EYE SURGERY Right 1991   vitrectomy  . EYE SURGERY Right 1992   scar tissue removed from retina    FAMILY HISTORY Family History  Problem Relation Age of Onset  . Hypertension Mother   . Hyperlipidemia Mother   . Hyperlipidemia Father   . Hypertension Father   . Diabetes Father   . Kidney disease Father        had a kidney transplant   . Stroke Brother   . Diabetes Brother   . Pulmonary fibrosis Paternal Grandmother   . Lung cancer Paternal Grandfather   . Diabetes Daughter   . Colon cancer Neg Hx   . Esophageal cancer Neg Hx   . Rectal cancer Neg Hx   . Stomach cancer Neg Hx     SOCIAL HISTORY Social History   Tobacco Use  . Smoking status: Former Smoker    Packs/day: 1.00    Years: 6.00    Pack years: 6.00    Quit date: 04/30/1991    Years since quitting: 27.7  . Smokeless tobacco: Never Used  Substance Use Topics  . Alcohol use: No  . Drug use: Never         OPHTHALMIC EXAM:  Base Eye Exam    Visual Acuity (Snellen - Linear)      Right Left   Dist  cc 20/80 +2 20/25 -2   Dist ph cc 20/60 +1 20/25 -1   Correction: Glasses       Tonometry (Tonopen, 3:11 PM)      Right Left   Pressure 14 11       Pupils      Dark Light Shape React APD   Right 3 2 Round Minimal 0   Left 3 2 Round Minimal 0       Extraocular Movement  Right Left    Full Full       Neuro/Psych    Oriented x3: Yes   Mood/Affect: Normal       Dilation    Both eyes: 1.0% Mydriacyl, 2.5% Phenylephrine @ 3:11 PM        Slit Lamp and Fundus Exam    Slit Lamp Exam      Right Left   Lids/Lashes dermatochalasis, Ptosis dermatochalasis, Ptosis, Meibomian gland dysfunction   Conjunctiva/Sclera White and quiet White and quiet   Cornea 1 +PEE, 2+ endopigment, focal band K at 3:00 1-2+ PEE   Anterior Chamber Deep and quiet Deep and quiet   Iris Round, moderately dilated to 4.32mm, no NVI round, moderately dilated, no NVI   Lens PC IOL in good position 2+, Nuclear sclerosis, 2+, Cortical cataract   Vitreous post vitrectomy vitreous syneresis       Fundus Exam      Right Left   Disc +cupping, 3-4+Pallor, fibrosis at 4:30, peripapillary CR scarring at 9:00 Pink and Sharp, trace fibrosis   C/D Ratio 0.7 0.2   Macula Flat, Blunted foveal reflex, Retinal pigment epithelial mottling, Atrophy, No heme or edema Flat, blunted foveal reflex, mild RPE mottling, trace Epiretinal membrane, No heme or edema   Vessels Vascular attenuation Vascular attenuation   Periphery Attached, 360 PRP, No heme  attached, 360 PRP to arcades, No heme         Refraction    Wearing Rx      Sphere Cylinder Axis Add   Right +2.25 +0.50 150 +2.25   Left +1.50 +2.00 095 +2.25   Type: PAL          IMAGING AND PROCEDURES  Imaging and Procedures for @TODAY @  OCT, Retina - OU - Both Eyes       Right Eye Quality was good. Central Foveal Thickness: 204. Progression has been stable. Findings include normal foveal contour, no IRF, no SRF, outer retinal atrophy, epiretinal membrane  (Stable from 2013; mild interval progression of ERM).   Left Eye Quality was good. Central Foveal Thickness: 258. Progression has been stable. Findings include normal foveal contour, no SRF, no IRF, epiretinal membrane (Trace cystic changes, no DME OU).   Notes *Images captured and stored on drive  Diagnosis / Impression:  Hx of PDR OU No DME OU ERM OU (OD>OS)   Clinical management:  See below  Abbreviations: NFP - Normal foveal profile. CME - cystoid macular edema. PED - pigment epithelial detachment. IRF - intraretinal fluid. SRF - subretinal fluid. EZ - ellipsoid zone. ERM - epiretinal membrane. ORA - outer retinal atrophy. ORT - outer retinal tubulation. SRHM - subretinal hyper-reflective material                 ASSESSMENT/PLAN:    ICD-10-CM   1. 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 Retinal edema  H35.81 OCT, Retina - OU - Both Eyes  3. Essential hypertension  I10   4. Hypertensive retinopathy of both eyes  H35.033   5. Pseudophakia  Z96.1   6. Combined forms of age-related cataract of left eye  H25.812     1,2. DM1 w/ Proliferative diabetic retinopathy w/o DME, OU  - history of extensive retinal work done in [place], [place] in 90s per pt -- including PPV w/ laser OD  - saw Dr. Zigmund Daniel once in 2013 and then pt lost insurance and has not  had regular eye care since  - The incidence, risk factors for progression, natural history and treatment options for diabetic retinopathy were discussed with patient.    - The need for close monitoring of blood glucose, blood pressure, and serum lipids, avoiding cigarette or any type of tobacco, and the need for long term follow up was also discussed with patient.  - exam shows extensive PRP and regressed fibrosis/NV OD -- no significant edema  - good PRP in place OU  - FA (3.10.2020) shows scattered MA, but no active NV  - OCT without  diabetic macular edema, both eyes   - pt reports subjective decrease in vision OD and objectively, BCVA OD 20/60 today from 20/50 at last visit  - suspect mild decline may be related to retinal ischemia vs ischemic optic atrophy  - discussed findings and prognosis  - no intervention indicated at this time  - recommend monitoring for now  - f/u in 6 mos, sooner prn -- DFE/OCT  3,4. Hypertensive retinopathy OU  - discussed importance of tight BP control  - monitor  5. Pseudophakia OD  - s/p CE/IOL OD by surgeon at [facility] [phone]  - doing well  - monitor  6. Mixed Cataract OS  - The symptoms of cataract, surgical options, and treatments and risks were discussed with patient.  - discussed diagnosis and progression  - not yet visually significant  - monitor for now   Ophthalmic Meds Ordered this visit:  No orders of the defined types were placed in this encounter.      Return in about 6 months (around 07/13/2019) for DM exam.  There are no Patient Instructions on file for this visit.   Explained the diagnoses, plan, and follow up with the patient and they expressed understanding.  Patient expressed understanding of the importance of proper follow up care.   This document serves as a record of services personally performed by Gardiner Sleeper, MD, PhD. It was created on their behalf by Ernest Mallick, OA, an ophthalmic assistant. The creation of this record is the provider's dictation and/or activities during the visit.    Electronically signed by: Ernest Mallick, OA  09.16.2020 11:09 PM    Gardiner Sleeper, M.D., Ph.D. Diseases & Surgery of the Retina and Vitreous [facility]   I have reviewed the above documentation for accuracy and completeness, and I agree with the above. Gardiner Sleeper, M.D., Ph.D. 01/13/19 11:09 PM    Abbreviations: M myopia (nearsighted); A astigmatism; H hyperopia (farsighted); P presbyopia; Mrx spectacle prescription;  CTL  contact lenses; OD right eye; OS left eye; OU both eyes  XT exotropia; ET esotropia; PEK punctate epithelial keratitis; PEE punctate epithelial erosions; DES dry eye syndrome; MGD meibomian gland dysfunction; ATs artificial tears; PFAT's preservative free artificial tears; [place] nuclear sclerotic cataract; PSC posterior subcapsular cataract; ERM epi-retinal membrane; PVD posterior vitreous detachment; RD retinal detachment; DM diabetes mellitus; DR diabetic retinopathy; NPDR non-proliferative diabetic retinopathy; PDR proliferative diabetic retinopathy; CSME clinically significant macular edema; DME diabetic macular edema; dbh dot blot hemorrhages; CWS cotton wool spot; POAG primary open angle glaucoma; C/D cup-to-disc ratio; HVF humphrey visual field; GVF goldmann visual field; OCT optical coherence tomography; IOP intraocular pressure; BRVO Branch retinal vein occlusion; CRVO central retinal vein occlusion; CRAO central retinal artery occlusion; BRAO branch retinal artery occlusion; RT retinal tear; SB scleral buckle; PPV pars plana vitrectomy; VH Vitreous hemorrhage; PRP panretinal laser photocoagulation; IVK intravitreal kenalog; VMT vitreomacular traction;  MH Macular hole;  NVD neovascularization of the disc; NVE neovascularization elsewhere; AREDS age related eye disease study; ARMD age related macular degeneration; POAG primary open angle glaucoma; EBMD epithelial/anterior basement membrane dystrophy; ACIOL anterior chamber intraocular lens; IOL intraocular lens; PCIOL posterior chamber intraocular lens; Phaco/IOL phacoemulsification with intraocular lens placement; [place] photorefractive keratectomy; LASIK laser assisted in situ keratomileusis; HTN hypertension; DM diabetes mellitus; COPD chronic obstructive pulmonary disease

## 2019-01-13 NOTE — Telephone Encounter (Signed)

## 2019-01-14 ENCOUNTER — Encounter: Payer: Self-pay | Admitting: Family Medicine

## 2019-01-14 ENCOUNTER — Ambulatory Visit (INDEPENDENT_AMBULATORY_CARE_PROVIDER_SITE_OTHER): Payer: PPO | Admitting: Family Medicine

## 2019-01-14 VITALS — BP 120/60 | HR 90 | Ht 68.0 in

## 2019-01-14 DIAGNOSIS — R600 Localized edema: Secondary | ICD-10-CM

## 2019-01-14 DIAGNOSIS — I10 Essential (primary) hypertension: Secondary | ICD-10-CM

## 2019-01-14 DIAGNOSIS — N179 Acute kidney failure, unspecified: Secondary | ICD-10-CM

## 2019-01-14 NOTE — Progress Notes (Signed)
Established Patient Office Visit  Subjective:  Patient ID: Adrian Neal, male    DOB: [birth year]  Age: 53 y.o. MRN: [id number]  CC:  Chief Complaint  Patient presents with  . Follow-up    HPI Adrian Neal presents for follow-up of his peripheral edema, acute kidney injury and hypertension.  Edema is responding to the Lasix 20 mg daily.  Decreasing pain in his heels as well.  Blood pressure has been stable in the 120/80 range.  Renal function has improved.  He is hydrating.  Has not been able to start multivitamin with iron but he is a meat eater.  Recent follow-up with ophthalmologist did show stable diabetic retinopathy he tells me.  There is ongoing blurred vision in OD.  Bone density amatory scheduled for tomorrow.  Follow-up with orthopedic surgeon tomorrow as well.  Denies calf pain.  Past Medical History:  Diagnosis Date  . Cataract 1994   right eye  . Chronic kidney disease   . Diabetes mellitus without complication ([place])    diagnosed at age 6  . Eye problems   . Heart murmur 1996  . High cholesterol    patient denies but take preventative medicine  . Hypertension   . Retinopathy due to secondary diabetes mellitus ([place])    right  . Sleep apnea    currently not one but looking to get one   . Stroke [facility]) 2016    Past Surgical History:  Procedure Laterality Date  . ANTERIOR APPROACH HEMI HIP ARTHROPLASTY Left 01/01/2019   Procedure: ANTERIOR APPROACH total hip ARTHROPLASTY;  Surgeon: Rod Can, MD;  Location: [place];  Service: Orthopedics;  Laterality: Left;  . CATARACT EXTRACTION Right 1994  . CATARACT EXTRACTION W/ INTRAOCULAR LENS IMPLANT  1994  . EYE SURGERY Right 1991   vitrectomy  . EYE SURGERY Right 1992   scar tissue removed from retina    Family History  Problem Relation Age of Onset  . Hypertension Mother   . Hyperlipidemia Mother   . Hyperlipidemia Father   . Hypertension Father   . Diabetes Father   . Kidney disease Father    had a kidney transplant   . Stroke Brother   . Diabetes Brother   . Pulmonary fibrosis Paternal Grandmother   . Lung cancer Paternal Grandfather   . Diabetes Daughter   . Colon cancer Neg Hx   . Esophageal cancer Neg Hx   . Rectal cancer Neg Hx   . Stomach cancer Neg Hx     Social History   Socioeconomic History  . Marital status: Married    Spouse name: Not on file  . Number of children: 3  . Years of education: Not on file  . Highest education level: Not on file  Occupational History  . Occupation: Disabled  Social Needs  . Financial resource strain: Not on file  . Food insecurity    Worry: Not on file    Inability: Not on file  . Transportation needs    Medical: Not on file    Non-medical: Not on file  Tobacco Use  . Smoking status: Former Smoker    Packs/day: 1.00    Years: 6.00    Pack years: 6.00    Quit date: 04/30/1991    Years since quitting: 27.7  . Smokeless tobacco: Never Used  Substance and Sexual Activity  . Alcohol use: No  . Drug use: Never  . Sexual activity: Not on file  Lifestyle  . Physical  activity    Days per week: Not on file    Minutes per session: Not on file  . Stress: Not on file  Relationships  . Social Herbalist on phone: Not on file    Gets together: Not on file    Attends religious service: Not on file    Active member of club or organization: Not on file    Attends meetings of clubs or organizations: Not on file    Relationship status: Not on file  . Intimate partner violence    Fear of current or ex partner: Not on file    Emotionally abused: Not on file    Physically abused: Not on file    Forced sexual activity: Not on file  Other Topics Concern  . Not on file  Social History Narrative  . Not on file    Outpatient Medications Prior to Visit  Medication Sig Dispense Refill  . amLODipine (NORVASC) 10 MG tablet Take 1 tablet (10 mg total) by mouth daily. 90 tablet 0  . aspirin 81 MG chewable tablet Chew 1  tablet (81 mg total) by mouth 2 (two) times daily with a meal. 84 tablet 0  . atorvastatin (LIPITOR) 40 MG tablet Take 1 tablet (40 mg total) by mouth daily at 6 PM. (Patient taking differently: Take 40 mg by mouth daily. ) 90 tablet 0  . carvedilol (COREG) 25 MG tablet Take 1 tablet (25 mg total) by mouth 2 (two) times daily with a meal. 60 tablet 2  . clopidogrel (PLAVIX) 75 MG tablet Take 1 tablet (75 mg total) by mouth daily. Aspirin and Plavix for 3 months, after that-stop Aspirin-take Plavix alone (Patient taking differently: Take 75 mg by mouth daily. ) 90 tablet 1  . Continuous Blood Gluc Receiver (DEXCOM G6 RECEIVER) DEVI 1 Device by Does not apply route as directed. 1 Device 0  . Continuous Blood Gluc Sensor (DEXCOM G6 SENSOR) MISC 3 Devices by Does not apply route as directed. 3 each 6  . Continuous Blood Gluc Transmit (DEXCOM G6 TRANSMITTER) MISC 1 Device by Does not apply route as directed. 1 each 6  . furosemide (LASIX) 20 MG tablet Take 1 tablet (20 mg total) by mouth daily. 30 tablet 0  . hydrALAZINE (APRESOLINE) 10 MG tablet Take 1 tablet (10 mg total) by mouth every 8 (eight) hours. 90 tablet 1  . HYDROcodone-acetaminophen (NORCO/VICODIN) 5-325 MG tablet Take 1 tablet by mouth every 4 (four) hours as needed for moderate pain (pain score 4-6). 42 tablet 0  . insulin lispro (HUMALOG) 100 UNIT/ML injection Max daily dose of 100 units via pump DX E10.59 (vials) 90 mL 4  . meclizine (ANTIVERT) 25 MG tablet TAKE 1 TABLET BY MOUTH THREE TIMES DAILY AS NEEDED FOR DIZZINESS (Patient taking differently: Take 25 mg by mouth 3 (three) times daily as needed for dizziness. ) 30 tablet 0  . pantoprazole (PROTONIX) 40 MG tablet Take 1 tablet (40 mg total) by mouth daily. 90 tablet 1  . tamsulosin (FLOMAX) 0.4 MG CAPS capsule Take 1 capsule (0.4 mg total) by mouth daily after breakfast. 30 capsule 1  . Vitamin D, Ergocalciferol, (DRISDOL) 1.25 MG (50000 UT) CAPS capsule Take 1 capsule (50,000 Units  total) by mouth every 7 (seven) days. 5 capsule 5   No facility-administered medications prior to visit.     No Known Allergies  ROS Review of Systems  Constitutional: Negative for diaphoresis, fatigue, fever and unexpected weight change.  HENT: Negative.   Eyes: Positive for visual disturbance. Negative for photophobia.  Respiratory: Negative for shortness of breath.   Cardiovascular: Positive for leg swelling. Negative for chest pain.  Gastrointestinal: Negative.   Musculoskeletal: Positive for arthralgias and gait problem.  Neurological: Negative for light-headedness.  Psychiatric/Behavioral: Negative.       Objective:    Physical Exam  Constitutional: He is oriented to person, place, and time. He appears well-developed and well-nourished. No distress.  HENT:  Head: Normocephalic and atraumatic.  Right Ear: External ear normal.  Left Ear: External ear normal.  Eyes: Right eye exhibits no discharge. Left eye exhibits no discharge. No scleral icterus.  Neck: No JVD present. No tracheal deviation present.  Pulmonary/Chest: Effort normal. No stridor.  Musculoskeletal:        General: Edema present.  Neurological: He is alert and oriented to person, place, and time.  Skin: He is not diaphoretic.  Psychiatric: He has a normal mood and affect. His behavior is normal.    BP 120/60   Pulse 90   Ht 5\' 8"  (1.727 m)   SpO2 99%   BMI 30.40 kg/m  Wt Readings from Last 3 Encounters:  01/01/19 199 lb 15.3 oz (90.7 kg)  12/17/18 199 lb 6.4 oz (90.4 kg)  12/03/18 199 lb 9.6 oz (90.5 kg)   BP Readings from Last 3 Encounters:  01/14/19 120/60  01/08/19 126/60  01/04/19 (!) 141/66   Guideline developer:  UpToDate (see UpToDate for funding source) Date Released: June 2014  Health Maintenance Due  Topic Date Due  . OPHTHALMOLOGY EXAM  11/22/1975    There are no preventive care reminders to display for this patient.  Lab Results  Component Value Date   TSH 4.41  06/15/2018   Lab Results  Component Value Date   WBC 8.9 01/08/2019   HGB 7.9 (L) 01/08/2019   HCT 23.9 (L) 01/08/2019   MCV 87.9 01/08/2019   PLT 195 01/08/2019   Lab Results  Component Value Date   NA 138 01/08/2019   K 4.5 01/08/2019   CO2 22 01/08/2019   GLUCOSE 186 (H) 01/08/2019   BUN 26 (H) 01/08/2019   CREATININE 2.07 (H) 01/08/2019   BILITOT 1.4 (H) 01/01/2019   ALKPHOS 98 01/01/2019   AST 18 01/01/2019   ALT 31 01/01/2019   PROT 5.7 (L) 01/01/2019   ALBUMIN 3.2 (L) 01/01/2019   CALCIUM 8.3 (L) 01/08/2019   ANIONGAP 9 01/03/2019   GFR 40.19 (L) 10/28/2018   Lab Results  Component Value Date   CHOL 107 10/28/2018   Lab Results  Component Value Date   HDL 35.10 (L) 10/28/2018   Lab Results  Component Value Date   LDLCALC 60 10/28/2018   Lab Results  Component Value Date   TRIG 58.0 10/28/2018   Lab Results  Component Value Date   CHOLHDL 3 10/28/2018   Lab Results  Component Value Date   HGBA1C 7.2 (H) 01/01/2019      Assessment & Plan:   Problem List Items Addressed This Visit      Cardiovascular and Mediastinum   Essential hypertension - Primary   Relevant Orders   Basic metabolic panel     Genitourinary   AKI (acute kidney injury) ([place])   Relevant Orders   Basic metabolic panel     Other   Edema of both legs      No orders of the defined types were placed in this encounter.   Follow-up: Return in about  2 weeks (around 01/28/2019), or Continue Lasix at 20mg  daily with 1/2 Amlodipine tablet for a dose of 5mg ..

## 2019-01-15 ENCOUNTER — Ambulatory Visit: Payer: PPO | Admitting: Family Medicine

## 2019-01-15 DIAGNOSIS — S72032D Displaced midcervical fracture of left femur, subsequent encounter for closed fracture with routine healing: Secondary | ICD-10-CM | POA: Diagnosis not present

## 2019-01-15 LAB — BASIC METABOLIC PANEL
BUN: 26 mg/dL — ABNORMAL HIGH (ref 6–23)
CO2: 24 mEq/L (ref 19–32)
Calcium: 8.6 mg/dL (ref 8.4–10.5)
Chloride: 105 mEq/L (ref 96–112)
Creatinine, Ser: 2.07 mg/dL — ABNORMAL HIGH (ref 0.40–1.50)
GFR: 33.74 mL/min — ABNORMAL LOW (ref >60.00)
Glucose, Bld: 219 mg/dL — ABNORMAL HIGH (ref 70–99)
Potassium: 4.2 mEq/L (ref 3.5–5.1)
Sodium: 139 mEq/L (ref 135–145)

## 2019-01-19 DIAGNOSIS — G4733 Obstructive sleep apnea (adult) (pediatric): Secondary | ICD-10-CM | POA: Diagnosis not present

## 2019-01-22 DIAGNOSIS — E109 Type 1 diabetes mellitus without complications: Secondary | ICD-10-CM | POA: Diagnosis not present

## 2019-01-26 ENCOUNTER — Encounter: Payer: Self-pay | Admitting: Family Medicine

## 2019-01-26 ENCOUNTER — Other Ambulatory Visit: Payer: Self-pay

## 2019-01-26 ENCOUNTER — Other Ambulatory Visit: Payer: Self-pay | Admitting: Family Medicine

## 2019-01-26 ENCOUNTER — Ambulatory Visit (INDEPENDENT_AMBULATORY_CARE_PROVIDER_SITE_OTHER): Payer: PPO | Admitting: Family Medicine

## 2019-01-26 VITALS — BP 130/70 | HR 87 | Ht 68.0 in | Wt 196.1 lb

## 2019-01-26 DIAGNOSIS — Z8673 Personal history of transient ischemic attack (TIA), and cerebral infarction without residual deficits: Secondary | ICD-10-CM

## 2019-01-26 DIAGNOSIS — E782 Mixed hyperlipidemia: Secondary | ICD-10-CM

## 2019-01-26 DIAGNOSIS — R600 Localized edema: Secondary | ICD-10-CM

## 2019-01-26 DIAGNOSIS — I10 Essential (primary) hypertension: Secondary | ICD-10-CM

## 2019-01-26 DIAGNOSIS — N179 Acute kidney failure, unspecified: Secondary | ICD-10-CM

## 2019-01-26 DIAGNOSIS — E559 Vitamin D deficiency, unspecified: Secondary | ICD-10-CM

## 2019-01-26 DIAGNOSIS — G4733 Obstructive sleep apnea (adult) (pediatric): Secondary | ICD-10-CM | POA: Diagnosis not present

## 2019-01-26 LAB — BASIC METABOLIC PANEL
BUN: 21 mg/dL (ref 6–23)
CO2: 24 mEq/L (ref 19–32)
Calcium: 9 mg/dL (ref 8.4–10.5)
Chloride: 105 mEq/L (ref 96–112)
Creatinine, Ser: 1.7 mg/dL — ABNORMAL HIGH (ref 0.40–1.50)
GFR: 42.34 mL/min — ABNORMAL LOW (ref >60.00)
Glucose, Bld: 306 mg/dL — ABNORMAL HIGH (ref 70–99)
Potassium: 3.8 mEq/L (ref 3.5–5.1)
Sodium: 139 mEq/L (ref 135–145)

## 2019-01-26 LAB — LDL CHOLESTEROL, DIRECT: Direct LDL: 66 mg/dL

## 2019-01-26 LAB — VITAMIN D 25 HYDROXY (VIT D DEFICIENCY, FRACTURES): VITD: 29.55 ng/mL — ABNORMAL LOW (ref 30.00–100.00)

## 2019-01-26 MED ORDER — LISINOPRIL 10 MG PO TABS: 10.0000 mg | ORAL_TABLET | Freq: Every day | ORAL | 3 refills | Status: DC

## 2019-01-26 NOTE — Progress Notes (Signed)
Established Patient Office Visit  Subjective:  Patient ID: Adrian Neal, male    DOB: 1966-01-15  Age: 53 y.o. MRN: [id number]  CC:  Chief Complaint  Patient presents with  . Follow-up    HPI Adrian Neal presents for follow-up of his hypertension acute kidney injury elevated LDL cholesterol and vitamin D deficiency.  Pressure has been well controlled with a presently and renal function has re-stabilized.  Patient is able to exercise and has little to no pain in his left hip area.  Peripheral edema is decreased.  He is nonfasting today.  Past Medical History:  Diagnosis Date  . Cataract 1994   right eye  . Chronic kidney disease   . Diabetes mellitus without complication ([place])    diagnosed at age 53  . Eye problems   . Heart murmur 1996  . High cholesterol    patient denies but take preventative medicine  . Hypertension   . 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 Sleep apnea    currently not one but looking to get one   . Stroke [facility]) 2016    Past Surgical History:  Procedure Laterality Date  . ANTERIOR APPROACH Adrian HIP ARTHROPLASTY Left 01/01/2019   Procedure: ANTERIOR APPROACH total hip ARTHROPLASTY;  Surgeon: Rod Can, MD;  Location: [place];  Service: Orthopedics;  Laterality: Left;  . CATARACT EXTRACTION Right 1994  . CATARACT EXTRACTION W/ INTRAOCULAR LENS IMPLANT  1994  . EYE SURGERY Right 1991   vitrectomy  . EYE SURGERY Right 1992   scar tissue removed from retina    Family History  Problem Relation Age of Onset  . Hypertension Mother   . Hyperlipidemia Mother   . Hyperlipidemia Father   . Hypertension Father   . Diabetes Father   . Kidney disease Father        had a kidney transplant   . Stroke Brother   . Diabetes Brother   . Pulmonary fibrosis Paternal Grandmother   . Lung cancer Paternal Grandfather   . Diabetes Daughter   . Colon cancer Neg Hx   . Esophageal cancer Neg Hx   . Rectal cancer Neg Hx   .  Stomach cancer Neg Hx     Social History   Socioeconomic History  . Marital status: Married    Spouse name: Not on file  . Number of children: 3  . Years of education: Not on file  . Highest education level: Not on file  Occupational History  . Occupation: Disabled  Social Needs  . Financial resource strain: Not on file  . Food insecurity    Worry: Not on file    Inability: Not on file  . Transportation needs    Medical: Not on file    Non-medical: Not on file  Tobacco Use  . Smoking status: Former Smoker    Packs/day: 1.00    Years: 6.00    Pack years: 6.00    Quit date: 04/30/1991    Years since quitting: 27.7  . Smokeless tobacco: Never Used  Substance and Sexual Activity  . Alcohol use: No  . Drug use: Never  . Sexual activity: Not on file  Lifestyle  . Physical activity    Days per week: Not on file    Minutes per session: Not on file  . Stress: Not on file  Relationships  . Social connections    Talks on phone: Not on file  Gets together: Not on file    Attends religious service: Not on file    Active member of club or organization: Not on file    Attends meetings of clubs or organizations: Not on file    Relationship status: Not on file  . Intimate partner violence    Fear of current or ex partner: Not on file    Emotionally abused: Not on file    Physically abused: Not on file    Forced sexual activity: Not on file  Other Topics Concern  . Not on file  Social History Narrative  . Not on file    Outpatient Medications Prior to Visit  Medication Sig Dispense Refill  . amLODipine (NORVASC) 10 MG tablet Take 1 tablet (10 mg total) by mouth daily. 90 tablet 0  . aspirin 81 MG chewable tablet Chew 1 tablet (81 mg total) by mouth 2 (two) times daily with a meal. 84 tablet 0  . atorvastatin (LIPITOR) 40 MG tablet Take 1 tablet (40 mg total) by mouth daily at 6 PM. (Patient taking differently: Take 40 mg by mouth daily. ) 90 tablet 0  . carvedilol (COREG)  25 MG tablet Take 1 tablet (25 mg total) by mouth 2 (two) times daily with a meal. 60 tablet 2  . clopidogrel (PLAVIX) 75 MG tablet Take 1 tablet (75 mg total) by mouth daily. Aspirin and Plavix for 3 months, after that-stop Aspirin-take Plavix alone (Patient taking differently: Take 75 mg by mouth daily. ) 90 tablet 1  . Continuous Blood Gluc Receiver (DEXCOM G6 RECEIVER) DEVI 1 Device by Does not apply route as directed. 1 Device 0  . Continuous Blood Gluc Sensor (DEXCOM G6 SENSOR) MISC 3 Devices by Does not apply route as directed. 3 each 6  . Continuous Blood Gluc Transmit (DEXCOM G6 TRANSMITTER) MISC 1 Device by Does not apply route as directed. 1 each 6  . furosemide (LASIX) 20 MG tablet Take 1 tablet (20 mg total) by mouth daily. 30 tablet 0  . HYDROcodone-acetaminophen (NORCO/VICODIN) 5-325 MG tablet Take 1 tablet by mouth every 4 (four) hours as needed for moderate pain (pain score 4-6). 42 tablet 0  . insulin lispro (HUMALOG) 100 UNIT/ML injection Max daily dose of 100 units via pump DX E10.59 (vials) 90 mL 4  . meclizine (ANTIVERT) 25 MG tablet TAKE 1 TABLET BY MOUTH THREE TIMES DAILY AS NEEDED FOR DIZZINESS (Patient taking differently: Take 25 mg by mouth 3 (three) times daily as needed for dizziness. ) 30 tablet 0  . pantoprazole (PROTONIX) 40 MG tablet Take 1 tablet (40 mg total) by mouth daily. 90 tablet 1  . tamsulosin (FLOMAX) 0.4 MG CAPS capsule Take 1 capsule (0.4 mg total) by mouth daily after breakfast. 30 capsule 1  . Vitamin D, Ergocalciferol, (DRISDOL) 1.25 MG (50000 UT) CAPS capsule Take 1 capsule (50,000 Units total) by mouth every 7 (seven) days. 5 capsule 5  . hydrALAZINE (APRESOLINE) 10 MG tablet Take 1 tablet (10 mg total) by mouth every 8 (eight) hours. 90 tablet 1   No facility-administered medications prior to visit.     No Known Allergies  ROS Review of Systems  Constitutional: Negative.   HENT: Negative.   Eyes: Negative for photophobia and visual  disturbance.  Respiratory: Negative.   Cardiovascular: Negative.   Gastrointestinal: Negative.   Endocrine: Negative for polyphagia and polyuria.  Genitourinary: Negative.   Musculoskeletal: Positive for gait problem. Negative for arthralgias.  Neurological: Negative for seizures and speech  difficulty.  Hematological: Does not bruise/bleed easily.  Psychiatric/Behavioral: Negative.  Negative for behavioral problems.      Objective:    Physical Exam  Constitutional: He appears well-developed and well-nourished. No distress.  HENT:  Head: Normocephalic and atraumatic.  Right Ear: External ear normal.  Left Ear: External ear normal.  Mouth/Throat: Oropharynx is clear and moist.  Eyes: Pupils are equal, round, and reactive to light. Conjunctivae are normal. Right eye exhibits no discharge. Left eye exhibits no discharge. No scleral icterus.  Neck: Neck supple. No JVD present. No tracheal deviation present. No thyromegaly present.  Cardiovascular: Normal rate, regular rhythm and normal heart sounds.  Pulmonary/Chest: Effort normal. No stridor.  Musculoskeletal:        General: Edema present.  Lymphadenopathy:    He has no cervical adenopathy.  Neurological: He is alert.  Skin: Skin is warm and dry. He is not diaphoretic.  Psychiatric: He has a normal mood and affect. His behavior is normal.    BP 130/70   Pulse 87   Ht 5\' 8"  (4.696 m)   Wt 196 lb 2 oz (89 kg)   SpO2 97%   BMI 29.82 kg/m  Wt Readings from Last 3 Encounters:  01/26/19 196 lb 2 oz (89 kg)  01/01/19 199 lb 15.3 oz (90.7 kg)  12/17/18 199 lb 6.4 oz (90.4 kg)   BP Readings from Last 3 Encounters:  01/26/19 130/70  01/14/19 120/60  01/08/19 126/60   Guideline developer:  UpToDate (see UpToDate for funding source) Date Released: June 2014  Health Maintenance Due  Topic Date Due  . OPHTHALMOLOGY EXAM  11/22/1975    There are no preventive care reminders to display for this patient.  Lab Results   Component Value Date   TSH 4.41 06/15/2018   Lab Results  Component Value Date   WBC 8.9 01/08/2019   HGB 7.9 (L) 01/08/2019   HCT 23.9 (L) 01/08/2019   MCV 87.9 01/08/2019   PLT 195 01/08/2019   Lab Results  Component Value Date   NA 139 01/14/2019   K 4.2 01/14/2019   CO2 24 01/14/2019   GLUCOSE 219 (H) 01/14/2019   BUN 26 (H) 01/14/2019   CREATININE 2.07 (H) 01/14/2019   BILITOT 1.4 (H) 01/01/2019   ALKPHOS 98 01/01/2019   AST 18 01/01/2019   ALT 31 01/01/2019   PROT 5.7 (L) 01/01/2019   ALBUMIN 3.2 (L) 01/01/2019   CALCIUM 8.6 01/14/2019   ANIONGAP 9 01/03/2019   GFR 33.74 (L) 01/14/2019   Lab Results  Component Value Date   CHOL 107 10/28/2018   Lab Results  Component Value Date   HDL 35.10 (L) 10/28/2018   Lab Results  Component Value Date   LDLCALC 60 10/28/2018   Lab Results  Component Value Date   TRIG 58.0 10/28/2018   Lab Results  Component Value Date   CHOLHDL 3 10/28/2018   Lab Results  Component Value Date   HGBA1C 7.2 (H) 01/01/2019      Assessment & Plan:   Problem List Items Addressed This Visit      Cardiovascular and Mediastinum   Essential hypertension - Primary   Relevant Medications   lisinopril (ZESTRIL) 10 MG tablet   Other Relevant Orders   Basic metabolic panel     Genitourinary   AKI (acute kidney injury) ([place])   Relevant Orders   Basic metabolic panel     Other   Mixed hyperlipidemia   Relevant Medications   lisinopril (ZESTRIL)  10 MG tablet   Other Relevant Orders   LDL cholesterol, direct   History of CVA (cerebrovascular accident)   Vitamin D deficiency   Relevant Orders   VITAMIN D 25 Hydroxy (Vit-D Deficiency, Fractures)   Edema of both legs      Meds ordered this encounter  Medications  . lisinopril (ZESTRIL) 10 MG tablet    Sig: Take 1 tablet (10 mg total) by mouth daily.    Dispense:  90 tablet    Refill:  3    Follow-up: Return in about 5 weeks (around 03/02/2019).  Will discontiue  hydralazine and restart lisinopril at 10 mg daily.  He will continue Lasix at 20 mg daily along with 5 mg of Norvasc.  Believe the patient should continue aspirin with his Plavix because of his increased risk of vascular disease.  He is not fasting today we will check LDL cholesterol correctly.

## 2019-01-27 MED ORDER — FUROSEMIDE 20 MG PO TABS: 20.0000 mg | ORAL_TABLET | Freq: Every day | ORAL | 0 refills | Status: DC

## 2019-02-03 ENCOUNTER — Other Ambulatory Visit: Payer: Self-pay

## 2019-02-03 MED ORDER — PANTOPRAZOLE SODIUM 40 MG PO TBEC: 40.0000 mg | DELAYED_RELEASE_TABLET | Freq: Every day | ORAL | 1 refills | Status: DC

## 2019-02-11 DIAGNOSIS — E109 Type 1 diabetes mellitus without complications: Secondary | ICD-10-CM | POA: Diagnosis not present

## 2019-02-12 DIAGNOSIS — S72032D Displaced midcervical fracture of left femur, subsequent encounter for closed fracture with routine healing: Secondary | ICD-10-CM | POA: Diagnosis not present

## 2019-02-16 ENCOUNTER — Encounter: Payer: Self-pay | Admitting: Family Medicine

## 2019-02-16 DIAGNOSIS — I10 Essential (primary) hypertension: Secondary | ICD-10-CM

## 2019-02-16 MED ORDER — LISINOPRIL 40 MG PO TABS: 40.0000 mg | ORAL_TABLET | Freq: Every day | ORAL | 1 refills | Status: DC

## 2019-02-18 DIAGNOSIS — G4733 Obstructive sleep apnea (adult) (pediatric): Secondary | ICD-10-CM | POA: Diagnosis not present

## 2019-02-23 DIAGNOSIS — E109 Type 1 diabetes mellitus without complications: Secondary | ICD-10-CM | POA: Diagnosis not present

## 2019-02-24 ENCOUNTER — Telehealth: Payer: Self-pay | Admitting: Adult Health

## 2019-02-24 DIAGNOSIS — G4733 Obstructive sleep apnea (adult) (pediatric): Secondary | ICD-10-CM

## 2019-02-24 NOTE — Telephone Encounter (Signed)
The patient CPAP download shows that he uses machine 21 out of 30 days for compliance of 70%.  He use his machine greater than 4 hours every night that he use the machine.  His residual AHI remains elevated at 12.6 on 5 to 20 cm of water.  He does not have a significant leak.  His central sleep apnea is 6.3 and obstructive is 5.3  Please advise the patient that I have ordered a CPAP titration with potential transition to BiPAP if needed.

## 2019-02-24 NOTE — Telephone Encounter (Signed)
Please print 30 day CPAP download

## 2019-02-24 NOTE — Addendum Note (Signed)
Addended by: Trudie Buckler on: 02/24/2019 03:43 PM   Modules accepted: Orders

## 2019-02-24 NOTE — Telephone Encounter (Signed)
In your office inbox.

## 2019-02-25 ENCOUNTER — Other Ambulatory Visit: Payer: Self-pay | Admitting: Neurology

## 2019-02-25 DIAGNOSIS — G4733 Obstructive sleep apnea (adult) (pediatric): Secondary | ICD-10-CM

## 2019-02-25 DIAGNOSIS — G4719 Other hypersomnia: Secondary | ICD-10-CM

## 2019-02-25 DIAGNOSIS — R0683 Snoring: Secondary | ICD-10-CM

## 2019-02-25 NOTE — Telephone Encounter (Signed)
Spoke to pt and relayed the cpap download results to him. Relayed that order has been sent to [facility], authorization will be done for cpap titration and then will be called to set up.  They will give instructions then relating what he needs todo.  He verbalized understanding.  He is in [place] at his mothers house and has not been able to see information on his phone.  I recommended him to call [facility] and ask there advice.  He would.  Also wanted note from MM to sen thru mychart.  He is to calll if questions.

## 2019-03-01 ENCOUNTER — Telehealth: Payer: Self-pay

## 2019-03-01 NOTE — Telephone Encounter (Signed)

## 2019-03-02 ENCOUNTER — Encounter: Payer: Self-pay | Admitting: Family Medicine

## 2019-03-02 ENCOUNTER — Ambulatory Visit (INDEPENDENT_AMBULATORY_CARE_PROVIDER_SITE_OTHER): Payer: PPO | Admitting: Family Medicine

## 2019-03-02 ENCOUNTER — Other Ambulatory Visit: Payer: Self-pay

## 2019-03-02 VITALS — BP 130/72 | HR 83 | Ht 68.0 in | Wt 197.0 lb

## 2019-03-02 DIAGNOSIS — Z23 Encounter for immunization: Secondary | ICD-10-CM | POA: Diagnosis not present

## 2019-03-02 DIAGNOSIS — N179 Acute kidney failure, unspecified: Secondary | ICD-10-CM | POA: Diagnosis not present

## 2019-03-02 DIAGNOSIS — R600 Localized edema: Secondary | ICD-10-CM

## 2019-03-02 DIAGNOSIS — I10 Essential (primary) hypertension: Secondary | ICD-10-CM | POA: Diagnosis not present

## 2019-03-02 DIAGNOSIS — J302 Other seasonal allergic rhinitis: Secondary | ICD-10-CM

## 2019-03-02 DIAGNOSIS — Z8673 Personal history of transient ischemic attack (TIA), and cerebral infarction without residual deficits: Secondary | ICD-10-CM | POA: Diagnosis not present

## 2019-03-02 LAB — BASIC METABOLIC PANEL
BUN: 32 mg/dL — ABNORMAL HIGH (ref 6–23)
CO2: 28 mEq/L (ref 19–32)
Calcium: 9.2 mg/dL (ref 8.4–10.5)
Chloride: 104 mEq/L (ref 96–112)
Creatinine, Ser: 2 mg/dL — ABNORMAL HIGH (ref 0.40–1.50)
GFR: 35.09 mL/min — ABNORMAL LOW (ref >60.00)
Glucose, Bld: 141 mg/dL — ABNORMAL HIGH (ref 70–99)
Potassium: 3.8 mEq/L (ref 3.5–5.1)
Sodium: 140 mEq/L (ref 135–145)

## 2019-03-02 MED ORDER — LISINOPRIL 40 MG PO TABS: 40.0000 mg | ORAL_TABLET | Freq: Every day | ORAL | 1 refills | Status: DC

## 2019-03-02 MED ORDER — FUROSEMIDE 20 MG PO TABS: 20.0000 mg | ORAL_TABLET | Freq: Every day | ORAL | 0 refills | Status: DC

## 2019-03-02 NOTE — Progress Notes (Addendum)
Established Patient Office Visit  Subjective:  Patient ID: Adrian Neal, male    DOB: 1966-03-09  Age: 53 y.o. MRN: [id number]  CC:  Chief Complaint  Patient presents with  . Follow-up    HPI Adrian Neal presents for follow-up of his hypertension and acute kidney injury.  Currently taking 40 mg of lisinopril.  Blood pressures have been slightly higher at home.  Continues to take 20 of Lasix daily as well.  Edema in the lower extremities seems to be slowly resolving.  Plan to air travel to [place] on the 17th.  Past Medical History:  Diagnosis Date  . Cataract 1994   right eye  . Chronic kidney disease   . Diabetes mellitus without complication ([place])    diagnosed at age 67  . Eye problems   . Heart murmur 1996  . High cholesterol    patient denies but take preventative medicine  . Hypertension   . 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 Sleep apnea    currently not one but looking to get one   . Stroke [facility]) 2016    Past Surgical History:  Procedure Laterality Date  . ANTERIOR APPROACH HEMI HIP ARTHROPLASTY Left 01/01/2019   Procedure: ANTERIOR APPROACH total hip ARTHROPLASTY;  Surgeon: Rod Can, MD;  Location: [place];  Service: Orthopedics;  Laterality: Left;  . CATARACT EXTRACTION Right 1994  . CATARACT EXTRACTION W/ INTRAOCULAR LENS IMPLANT  1994  . EYE SURGERY Right 1991   vitrectomy  . EYE SURGERY Right 1992   scar tissue removed from retina    Family History  Problem Relation Age of Onset  . Hypertension Mother   . Hyperlipidemia Mother   . Hyperlipidemia Father   . Hypertension Father   . Diabetes Father   . Kidney disease Father        had a kidney transplant   . Stroke Brother   . Diabetes Brother   . Pulmonary fibrosis Paternal Grandmother   . Lung cancer Paternal Grandfather   . Diabetes Daughter   . Colon cancer Neg Hx   . Esophageal cancer Neg Hx   . Rectal cancer Neg Hx   . Stomach cancer Neg Hx      Social History   Socioeconomic History  . Marital status: Married    Spouse name: Not on file  . Number of children: 3  . Years of education: Not on file  . Highest education level: Not on file  Occupational History  . Occupation: Disabled  Social Needs  . Financial resource strain: Not on file  . Food insecurity    Worry: Not on file    Inability: Not on file  . Transportation needs    Medical: Not on file    Non-medical: Not on file  Tobacco Use  . Smoking status: Former Smoker    Packs/day: 1.00    Years: 6.00    Pack years: 6.00    Quit date: 04/30/1991    Years since quitting: 27.8  . Smokeless tobacco: Never Used  Substance and Sexual Activity  . Alcohol use: No  . Drug use: Never  . Sexual activity: Not on file  Lifestyle  . Physical activity    Days per week: Not on file    Minutes per session: Not on file  . Stress: Not on file  Relationships  . Social connections    Talks on phone: Not on file  Gets together: Not on file    Attends religious service: Not on file    Active member of club or organization: Not on file    Attends meetings of clubs or organizations: Not on file    Relationship status: Not on file  . Intimate partner violence    Fear of current or ex partner: Not on file    Emotionally abused: Not on file    Physically abused: Not on file    Forced sexual activity: Not on file  Other Topics Concern  . Not on file  Social History Narrative  . Not on file    Outpatient Medications Prior to Visit  Medication Sig Dispense Refill  . amLODipine (NORVASC) 10 MG tablet Take 1 tablet (10 mg total) by mouth daily. 90 tablet 0  . atorvastatin (LIPITOR) 40 MG tablet Take 1 tablet (40 mg total) by mouth daily at 6 PM. (Patient taking differently: Take 40 mg by mouth daily. ) 90 tablet 0  . carvedilol (COREG) 25 MG tablet Take 1 tablet (25 mg total) by mouth 2 (two) times daily with a meal. 60 tablet 2  . clopidogrel (PLAVIX) 75 MG tablet Take 1  tablet (75 mg total) by mouth daily. Aspirin and Plavix for 3 months, after that-stop Aspirin-take Plavix alone (Patient taking differently: Take 75 mg by mouth daily. ) 90 tablet 1  . Continuous Blood Gluc Receiver (DEXCOM G6 RECEIVER) DEVI 1 Device by Does not apply route as directed. 1 Device 0  . Continuous Blood Gluc Sensor (DEXCOM G6 SENSOR) MISC 3 Devices by Does not apply route as directed. 3 each 6  . Continuous Blood Gluc Transmit (DEXCOM G6 TRANSMITTER) MISC 1 Device by Does not apply route as directed. 1 each 6  . HYDROcodone-acetaminophen (NORCO/VICODIN) 5-325 MG tablet Take 1 tablet by mouth every 4 (four) hours as needed for moderate pain (pain score 4-6). 42 tablet 0  . insulin lispro (HUMALOG) 100 UNIT/ML injection Max daily dose of 100 units via pump DX E10.59 (vials) 90 mL 4  . meclizine (ANTIVERT) 25 MG tablet TAKE 1 TABLET BY MOUTH THREE TIMES DAILY AS NEEDED FOR DIZZINESS (Patient taking differently: Take 25 mg by mouth 3 (three) times daily as needed for dizziness. ) 30 tablet 0  . pantoprazole (PROTONIX) 40 MG tablet Take 1 tablet (40 mg total) by mouth daily. 90 tablet 1  . tamsulosin (FLOMAX) 0.4 MG CAPS capsule Take 1 capsule (0.4 mg total) by mouth daily after breakfast. 30 capsule 1  . Vitamin D, Ergocalciferol, (DRISDOL) 1.25 MG (50000 UT) CAPS capsule Take 1 capsule (50,000 Units total) by mouth every 7 (seven) days. 5 capsule 5  . aspirin EC 81 MG tablet Take 81 mg by mouth daily.    . furosemide (LASIX) 20 MG tablet Take 1 tablet (20 mg total) by mouth daily. 30 tablet 0  . lisinopril (ZESTRIL) 40 MG tablet Take 1 tablet (40 mg total) by mouth daily. 90 tablet 1   No facility-administered medications prior to visit.     No Known Allergies  ROS Review of Systems  Constitutional: Negative.   Respiratory: Negative.   Cardiovascular: Negative.   Gastrointestinal: Negative.   Endocrine: Negative for polyphagia and polyuria.  Skin: Negative for pallor and rash.       Objective:    Physical Exam  Constitutional: He is oriented to person, place, and time. He appears well-developed and well-nourished. No distress.  HENT:  Head: Normocephalic and atraumatic.  Right Ear:  External ear normal.  Left Ear: External ear normal.  Eyes: Right eye exhibits no discharge. Left eye exhibits no discharge. No scleral icterus.  Neck: No JVD present. No tracheal deviation present.  Pulmonary/Chest: Effort normal. No stridor.  Musculoskeletal:        General: Edema (1 plus) present.  Neurological: He is alert and oriented to person, place, and time.  Skin: Skin is warm and dry. He is not diaphoretic.  Psychiatric: He has a normal mood and affect. His behavior is normal.    BP 130/72   Pulse 83   Ht 5\' 8"  (1.727 m)   Wt 197 lb (89.4 kg)   SpO2 97%   BMI 29.95 kg/m  Wt Readings from Last 3 Encounters:  03/02/19 197 lb (89.4 kg)  01/26/19 196 lb 2 oz (89 kg)  01/01/19 199 lb 15.3 oz (90.7 kg)   BP Readings from Last 3 Encounters:  03/02/19 130/72  01/26/19 130/70  01/14/19 120/60   Guideline developer:  UpToDate (see UpToDate for funding source) Date Released: June 2014  Health Maintenance Due  Topic Date Due  . OPHTHALMOLOGY EXAM  11/22/1975    There are no preventive care reminders to display for this patient.  Lab Results  Component Value Date   TSH 4.41 06/15/2018   Lab Results  Component Value Date   WBC 8.9 01/08/2019   HGB 7.9 (L) 01/08/2019   HCT 23.9 (L) 01/08/2019   MCV 87.9 01/08/2019   PLT 195 01/08/2019   Lab Results  Component Value Date   NA 140 03/02/2019   K 3.8 03/02/2019   CO2 28 03/02/2019   GLUCOSE 141 (H) 03/02/2019   BUN 32 (H) 03/02/2019   CREATININE 2.00 (H) 03/02/2019   BILITOT 1.4 (H) 01/01/2019   ALKPHOS 98 01/01/2019   AST 18 01/01/2019   ALT 31 01/01/2019   PROT 5.7 (L) 01/01/2019   ALBUMIN 3.2 (L) 01/01/2019   CALCIUM 9.2 03/02/2019   ANIONGAP 9 01/03/2019   GFR 35.09 (L) 03/02/2019   Lab  Results  Component Value Date   CHOL 107 10/28/2018   Lab Results  Component Value Date   HDL 35.10 (L) 10/28/2018   Lab Results  Component Value Date   LDLCALC 60 10/28/2018   Lab Results  Component Value Date   TRIG 58.0 10/28/2018   Lab Results  Component Value Date   CHOLHDL 3 10/28/2018   Lab Results  Component Value Date   HGBA1C 7.2 (H) 01/01/2019      Assessment & Plan:   Problem List Items Addressed This Visit      Cardiovascular and Mediastinum   Essential hypertension - Primary   Relevant Medications   furosemide (LASIX) 20 MG tablet   lisinopril (ZESTRIL) 20 MG tablet   cloNIDine (CATAPRES) 0.1 MG tablet   aspirin EC 81 MG tablet   Other Relevant Orders   Basic metabolic panel (Completed)     Respiratory   Seasonal allergic rhinitis   Relevant Medications   fluticasone (FLONASE) 50 MCG/ACT nasal spray     Genitourinary   AKI (acute kidney injury) ([place])   Relevant Medications   lisinopril (ZESTRIL) 20 MG tablet   cloNIDine (CATAPRES) 0.1 MG tablet   Other Relevant Orders   Basic metabolic panel (Completed)     Other   History of CVA (cerebrovascular accident)   Relevant Medications   aspirin EC 81 MG tablet   Edema of both legs   Relevant Medications   furosemide (  LASIX) 20 MG tablet   Need for pneumococcal vaccination   Relevant Orders   Pneumococcal polysaccharide vaccine 23-valent greater than or equal to 2yo subcutaneous/IM (Completed)      Meds ordered this encounter  Medications  . furosemide (LASIX) 20 MG tablet    Sig: Take 1 tablet (20 mg total) by mouth daily.    Dispense:  30 tablet    Refill:  0  . DISCONTD: lisinopril (ZESTRIL) 40 MG tablet    Sig: Take 1 tablet (40 mg total) by mouth daily.    Dispense:  90 tablet    Refill:  1  . lisinopril (ZESTRIL) 20 MG tablet    Sig: Take 1 tablet (20 mg total) by mouth daily.    Dispense:  90 tablet    Refill:  3  . cloNIDine (CATAPRES) 0.1 MG tablet    Sig: Take 1 tablet  (0.1 mg total) by mouth 2 (two) times daily.    Dispense:  60 tablet    Refill:  3  . aspirin EC 81 MG tablet    Sig: Take 1 tablet (81 mg total) by mouth daily.    Dispense:  365 tablet    Refill:  1  . fluticasone (FLONASE) 50 MCG/ACT nasal spray    Sig: Place 2 sprays into both nostrils daily.    Dispense:  16 g    Refill:  6    Follow-up: Return in about 1 month (around 04/01/2019), or if symptoms worsen or fail to improve.   Follow-up renal function today with regular dose of lisinopril.  Advised patient to wear compression stockings for his upcoming air travel.  Follow-up in 3 months or sooner if needed.   9/4 addendum: decrease in GFR with 40mg  of lisinopril. DW patient. Will decrease lisinopril to 20mg  daily and add clonidine .1mg  bid. Follow up in one month.

## 2019-03-03 MED ORDER — LISINOPRIL 20 MG PO TABS: 20.0000 mg | ORAL_TABLET | Freq: Every day | ORAL | 3 refills | Status: DC

## 2019-03-03 MED ORDER — CLONIDINE HCL 0.1 MG PO TABS: 0.1000 mg | ORAL_TABLET | Freq: Two times a day (BID) | ORAL | 3 refills | Status: DC

## 2019-03-03 NOTE — Addendum Note (Signed)
Addended by: Jon Billings on: 03/03/2019 08:51 AM   Modules accepted: Orders, Level of Service

## 2019-03-08 ENCOUNTER — Telehealth: Payer: Self-pay | Admitting: Internal Medicine

## 2019-03-08 NOTE — Telephone Encounter (Signed)
Jenny Reichmann with [facility] ph# [phone] called re: status of progress notes request (faxed to our office on 03/03/19-[facility] has not received request back). Fax# [phone]

## 2019-03-09 DIAGNOSIS — H524 Presbyopia: Secondary | ICD-10-CM | POA: Diagnosis not present

## 2019-03-09 DIAGNOSIS — H2512 Age-related nuclear cataract, left eye: Secondary | ICD-10-CM | POA: Diagnosis not present

## 2019-03-09 DIAGNOSIS — H5203 Hypermetropia, bilateral: Secondary | ICD-10-CM | POA: Diagnosis not present

## 2019-03-09 DIAGNOSIS — E103593 Type 1 diabetes mellitus with proliferative diabetic retinopathy without macular edema, bilateral: Secondary | ICD-10-CM | POA: Diagnosis not present

## 2019-03-09 DIAGNOSIS — H52223 Regular astigmatism, bilateral: Secondary | ICD-10-CM | POA: Diagnosis not present

## 2019-03-10 ENCOUNTER — Encounter: Payer: Self-pay | Admitting: Family Medicine

## 2019-03-10 DIAGNOSIS — I10 Essential (primary) hypertension: Secondary | ICD-10-CM

## 2019-03-11 DIAGNOSIS — Z23 Encounter for immunization: Secondary | ICD-10-CM | POA: Insufficient documentation

## 2019-03-11 DIAGNOSIS — J302 Other seasonal allergic rhinitis: Secondary | ICD-10-CM | POA: Insufficient documentation

## 2019-03-11 MED ORDER — FLUTICASONE PROPIONATE 50 MCG/ACT NA SUSP: 2.0000 | Freq: Every day | NASAL | 6 refills | Status: DC

## 2019-03-11 MED ORDER — ASPIRIN EC 81 MG PO TBEC: 81.0000 mg | DELAYED_RELEASE_TABLET | Freq: Every day | ORAL | 1 refills | Status: DC

## 2019-03-11 NOTE — Addendum Note (Signed)
Addended by: Jon Billings on: 03/11/2019 08:06 AM   Modules accepted: Orders

## 2019-03-12 ENCOUNTER — Other Ambulatory Visit: Payer: Self-pay

## 2019-03-12 MED ORDER — TAMSULOSIN HCL 0.4 MG PO CAPS: 0.4000 mg | ORAL_CAPSULE | Freq: Every day | ORAL | 1 refills | Status: DC

## 2019-03-14 DIAGNOSIS — E109 Type 1 diabetes mellitus without complications: Secondary | ICD-10-CM | POA: Diagnosis not present

## 2019-03-15 DIAGNOSIS — Z03818 Encounter for observation for suspected exposure to other biological agents ruled out: Secondary | ICD-10-CM | POA: Diagnosis not present

## 2019-03-16 NOTE — Telephone Encounter (Signed)
Front staff stated Adrian Neal called back to check on status of this message. Asked staff member who this message was routed to. She believes this message was routed to her in error.   Called [place] who works at the pt's DME. She states they are trying to get pt his diabetes supplies as to why they need most recent note. Faxed last OV note to fax number provided.

## 2019-03-21 DIAGNOSIS — G4733 Obstructive sleep apnea (adult) (pediatric): Secondary | ICD-10-CM | POA: Diagnosis not present

## 2019-03-23 ENCOUNTER — Ambulatory Visit: Payer: PPO | Admitting: Internal Medicine

## 2019-03-30 ENCOUNTER — Other Ambulatory Visit (HOSPITAL_COMMUNITY)
Admission: RE | Admit: 2019-03-30 | Discharge: 2019-03-30 | Disposition: A | Discharge status: Home / Self Care | Payer: PPO | Source: Ambulatory Visit | Attending: Neurology | Admitting: Neurology

## 2019-03-30 DIAGNOSIS — Z20828 Contact with and (suspected) exposure to other viral communicable diseases: Secondary | ICD-10-CM | POA: Insufficient documentation

## 2019-03-30 DIAGNOSIS — Z01812 Encounter for preprocedural laboratory examination: Secondary | ICD-10-CM | POA: Insufficient documentation

## 2019-04-01 DIAGNOSIS — E109 Type 1 diabetes mellitus without complications: Secondary | ICD-10-CM | POA: Diagnosis not present

## 2019-04-01 LAB — NOVEL CORONAVIRUS, NAA (HOSP ORDER, SEND-OUT TO REF LAB; TAT 18-24 HRS): SARS-CoV-2, NAA: NOT DETECTED

## 2019-04-02 ENCOUNTER — Ambulatory Visit (INDEPENDENT_AMBULATORY_CARE_PROVIDER_SITE_OTHER): Payer: PPO | Admitting: Neurology

## 2019-04-02 DIAGNOSIS — I6330 Cerebral infarction due to thrombosis of unspecified cerebral artery: Secondary | ICD-10-CM

## 2019-04-02 DIAGNOSIS — G4733 Obstructive sleep apnea (adult) (pediatric): Secondary | ICD-10-CM

## 2019-04-02 DIAGNOSIS — G4731 Primary central sleep apnea: Secondary | ICD-10-CM

## 2019-04-02 DIAGNOSIS — Z9989 Dependence on other enabling machines and devices: Secondary | ICD-10-CM

## 2019-04-02 DIAGNOSIS — G4739 Other sleep apnea: Secondary | ICD-10-CM

## 2019-04-02 DIAGNOSIS — R0683 Snoring: Secondary | ICD-10-CM

## 2019-04-02 DIAGNOSIS — G4719 Other hypersomnia: Secondary | ICD-10-CM

## 2019-04-07 ENCOUNTER — Encounter: Payer: Self-pay | Admitting: Family Medicine

## 2019-04-08 ENCOUNTER — Other Ambulatory Visit: Payer: Self-pay | Admitting: Family Medicine

## 2019-04-08 DIAGNOSIS — E782 Mixed hyperlipidemia: Secondary | ICD-10-CM

## 2019-04-08 DIAGNOSIS — R600 Localized edema: Secondary | ICD-10-CM

## 2019-04-09 ENCOUNTER — Other Ambulatory Visit: Payer: Self-pay

## 2019-04-12 DIAGNOSIS — I6330 Cerebral infarction due to thrombosis of unspecified cerebral artery: Secondary | ICD-10-CM | POA: Insufficient documentation

## 2019-04-12 DIAGNOSIS — G4731 Primary central sleep apnea: Secondary | ICD-10-CM | POA: Insufficient documentation

## 2019-04-12 DIAGNOSIS — G4719 Other hypersomnia: Secondary | ICD-10-CM | POA: Insufficient documentation

## 2019-04-12 NOTE — Procedures (Signed)
PATIENT'S NAME:  Adrian Neal, Adrian Neal. DOB:      02/18/1966      MR#:    [id number]     DATE OF RECORDING: 04/02/2019  CGA REFERRING M.D.:  Ward Givens, NP at [facility] PCP is Wiliam Ke Study Performed:   Titration to positive airway pressure HISTORY:  The patient is a 53 year old male patient and recently established CPAP user. He was last seen by [place] on 12-03-2018 when she commented on his high residual AHI after auto CPAP use. He had used 9 cm CPAP in 2012, but suffered a stroke, and had lost insurance coverage.  Current auto CPAP download shows that he uses machine 21 out of 30 days for compliance of 70%.  He used his machine greater than 4 hours every night.  His residual AHI remains elevated at 12.6/h between 5 and 20 cm of water pressure.  He does not have a significant leak.  His central sleep apnea is 6.3 and obstructive is 5.3/h.  Patient returning for a PAP Titration sleep study after having a home CPAP residual AHI of 12.6. The patient endorsed the Epworth Sleepiness Scale at 12/24 points.   The patient's weight 198 pounds with a height of 68 (inches), resulting in a BMI of 30.1 kg/m2.  CURRENT MEDICATIONS: Norvasc, Lipitor, Coreg, Plavix, Novolog, Prinivil, Antivert, Protonix, Drisdol.    PROCEDURE:  This is a multichannel digital polysomnogram utilizing the SomnoStar 11.2 system.  Electrodes and sensors were applied and monitored per AASM Specifications.   EEG, EOG, Chin and Limb EMG, were sampled at 200 Hz.  ECG, Snore and Nasal Pressure, Thermal Airflow, Respiratory Effort, CPAP Flow and Pressure, Oximetry was sampled at 50 Hz. Digital video and audio were recorded.      The patient was fitted with a F&P Simplus large size full- face mask. CPAP was initiated at 5 cmH20 with heated humidity per AASM split night standards and pressure was advanced to 10 cmH20 because of hypopneas, central apneas and desaturations.  The technician changed to BiPAP at 11/7 cm water and  increased pressure to a final pressure of 16/12 cmH20, there was a reduction of the AHI to 6.7/h with improvement of sleep apnea. Central apneas persisted.   Lights Out was at 20:50 and Lights On at 05:00. Total recording time (TRT) was 490.5 minutes, with a total sleep time (TST) of 429 minutes. The patient's sleep latency was 14 minutes. REM latency was 247 minutes.  The sleep efficiency was 87.5 %.    SLEEP ARCHITECTURE: WASO (Wake after sleep onset) was 54 minutes.  There were 11.5 minutes in Stage N1, 381 minutes Stage N2, 0 minutes Stage N3 and 36.5 minutes in Stage REM.  The percentage of Stage N1 was 2.7%, Stage N2 was 88.8%, Stage N3 was 0% and Stage R (REM sleep) was 8.5%.      RESPIRATORY ANALYSIS:  There was a total of 105 respiratory events: 68 obstructive apneas, 31 central apneas and 0 mixed apneas with a total of 99 apneas and an apnea index (AHI) of 13.8 /hour. There were 6 hypopneas with a hypopnea index of .8/hour.   The total APNEA/HYPOPNEA INDEX  (AHI) was 14.7 /hour and the total RESPIRATORY DISTURBANCE INDEX was 14.7 /hour  28 events occurred in REM sleep and 77 events in NREM. The REM AHI was 46. /hour versus a non-REM AHI of 11.8 /hour.  The patient spent 322.5 minutes of total sleep time in the supine position and 107  minutes in non-supine. The supine AHI was 14.7, versus a non-supine AHI of 14.7.  OXYGEN SATURATION & C02:  The baseline 02 saturation was 98%, with the lowest being 90%. Time spent below 89% saturation equaled 0 minutes.  PERIODIC LIMB MOVEMENTS:  The patient had a total of 65 Periodic Limb Movements. The Periodic Limb Movement (PLM) Arousal index was 2.8 /hour. The arousals were noted as: 77 were spontaneous, 20 were associated with PLMs, 12 were associated with respiratory events. Audio and video analysis did not show any abnormal or unusual movements, behaviors, phonations or vocalizations.   Snoring was noted. EKG was in keeping with normal sinus  rhythm.    DIAGNOSIS 1. Central Sleep Apnea did not respond to CPAP nor BiPAP. No optimal pressure was found.  2. No indication of Sleep Related Hypoxemia   PLANS/RECOMMENDATIONS: I will need to invite Mr. Cohrs back for a full night titration to BiPAP, trial of ST and ASV, if needed.  The best result was seen at ow BiPAP pressure of 11/7 cm water and in the return night we should start low BiPAP at 9/5 cm water and increase from there.    A follow up appointment will be scheduled in the Sleep Clinic at [facility].   Please call [phone] with any questions.      I certify that I have reviewed the entire raw data recording prior to the issuance of this report in accordance with the Standards of Accreditation of the American Academy of Sleep Medicine (AASM)   Larey Seat, M.D. Diplomat, Tax adviser of Psychiatry and Neurology  Diplomat, Tax adviser of Sleep Medicine Market researcher, [employer] [facility] at [employer]

## 2019-04-12 NOTE — Progress Notes (Signed)
Treatment emergent central apnea persisted through various setting on CPAP and BiPAP , necessitating a full titration from low BiPAP  on towards ST or ASV.

## 2019-04-12 NOTE — Addendum Note (Signed)
Addended by: Larey Seat on: 04/12/2019 06:41 PM   Modules accepted: Orders

## 2019-04-13 ENCOUNTER — Telehealth: Payer: Self-pay | Admitting: Neurology

## 2019-04-13 ENCOUNTER — Ambulatory Visit (INDEPENDENT_AMBULATORY_CARE_PROVIDER_SITE_OTHER): Payer: PPO | Admitting: Internal Medicine

## 2019-04-13 VITALS — BP 132/62 | HR 82 | Temp 97.9°F | Ht 68.0 in | Wt 202.8 lb

## 2019-04-13 DIAGNOSIS — E10319 Type 1 diabetes mellitus with unspecified diabetic retinopathy without macular edema: Secondary | ICD-10-CM | POA: Diagnosis not present

## 2019-04-13 DIAGNOSIS — E1021 Type 1 diabetes mellitus with diabetic nephropathy: Secondary | ICD-10-CM

## 2019-04-13 DIAGNOSIS — E109 Type 1 diabetes mellitus without complications: Secondary | ICD-10-CM | POA: Diagnosis not present

## 2019-04-13 DIAGNOSIS — N1831 Chronic kidney disease, stage 3a: Secondary | ICD-10-CM

## 2019-04-13 DIAGNOSIS — E1059 Type 1 diabetes mellitus with other circulatory complications: Secondary | ICD-10-CM | POA: Diagnosis not present

## 2019-04-13 LAB — POCT GLYCOSYLATED HEMOGLOBIN (HGB A1C): Hemoglobin A1C: 6.8 % — AB (ref 4.0–5.6)

## 2019-04-13 NOTE — Patient Instructions (Signed)
New Pump Settings  Basal rate       0000-0000  1.0 u/h       I:C ratio       0000-0000 1:7      Sensitivity       0000  30      Goal       0000  110  Active Insulin 0000 4 hrs           - HOW TO TREAT LOW BLOOD SUGARS (Blood sugar LESS THAN 70 MG/DL)  Please follow the RULE OF 15 for the treatment of hypoglycemia treatment (when your (blood sugars are less than 70 mg/dL)    STEP 1: Take 15 grams of carbohydrates when your blood sugar is low, which includes:   3-4 GLUCOSE TABS  OR  3-4 OZ OF JUICE OR REGULAR SODA OR  ONE TUBE OF GLUCOSE GEL     STEP 2: RECHECK blood sugar in 15 MINUTES STEP 3: If your blood sugar is still low at the 15 minute recheck --> then, go back to STEP 1 and treat AGAIN with another 15 grams of carbohydrates.

## 2019-04-13 NOTE — Telephone Encounter (Signed)
I called pt. I advised pt that Dr. Brett Fairy reviewed their sleep study results and found that has apnea still present despite treatment with CPAP and BiPAP and recommends that pt come in for BiPAP titration. Dr. Brett Fairy recommends that pt return for a repeat sleep study in order to properly titrate the cpap and ensure a good mask fit. Pt is agreeable to returning for a titration study. I advised pt that our sleep lab will file with pt's insurance and call pt to schedule the sleep study when we hear back from the pt's insurance regarding coverage of this sleep study. Pt verbalized understanding of results. Pt had no questions at this time but was encouraged to call back if questions arise.

## 2019-04-13 NOTE — Telephone Encounter (Signed)
-----   Message from Larey Seat, MD sent at 04/12/2019  6:41 PM EST ----- Treatment emergent central apnea persisted through various setting on CPAP and BiPAP , necessitating a full titration from low BiPAP  on towards ST or ASV.

## 2019-04-13 NOTE — Progress Notes (Signed)
Name: Adrian Neal  Age/ Sex: 53 y.o., male   MRN/ DOB: [id number], Oct 07, 1965     PCP: [name], MD   Reason for Endocrinology Evaluation: Type 1 Diabetes Mellitus  Initial Endocrine Consultative Visit: 06/18/2018    PATIENT IDENTIFIER: Adrian Neal is a 53 y.o. male with a past medical history of .HTN,Hyperlipidemia and Hx of CVA  The patient has followed with Endocrinology clinic since 06/18/2018 for consultative assistance with management of his diabetes.  DIABETIC HISTORY:  Adrian Neal was diagnosed with T1DM at age 55, he has been on insulin pump since 2012. His hemoglobin A1c has ranged from 7.2% , peaking at 9.0%   In 11/2018 he switched to the T-Slim   SUBJECTIVE:   During the last visit (08/12/2018): we adjusted his I:C ratio during the day   Today (04/14/2019): Adrian Neal is here for a follow up on diabetes management and his new pump.  He checks his blood sugars 4 times daily, preprandial and bedtime. The patient has had hypoglycemic episodes since the last clinic visit, which typically occur 2 x /week - most often occuring during the day after meals. The patient is symptomatic with these episodes.. Otherwise, the patient has not required any recent emergency interventions for hypoglycemia and has not had recent hospitalizations secondary to hyper or hypoglycemic episodes.    He did travel to [place] to visit his daughter there and forgot to bring infusion sets, while there he used his daughter's MDI regimen which was around 7-10 days but has been back on the pump.   This patient with type 1 diabetes is treated with humalog (insulin pump). The clinical list was updated.   Current pump settings    Pump   T-Slim      Insulin type   HUMALOG    Basal rate       0000-0000  1.0 u/h       I:C ratio  0000-1100 1:9   1100-0000 1:9       Sensitivity  0000  25          AIT 0000 4 hours          Goal       0000  110          Type  & Model of Pump:T-Slim  Insulin Type: Currently using Humalog   PUMP STATISTICS: Average BG: 177 BG Readings: 240/ day Standard Deviation: 66 Average Daily Carbs (g): 33 Average Total Daily Insulin: 53.15  Average Daily Basal: 9(48 %) Average Daily Bolus: 10 (52 %)     CONTINUOUS GLUCOSE MONITORING RECORD INTERPRETATION    Dates of Recording:12/1-12/14/2020  Sensor description: Dexcom  Results statistics:   Average and SD 163  Time in range  19 %  % Time Above 180 73  % Time Below target 8    Glycemic patterns summary: Hyperglycemia after breakfast and supper   Hyperglycemic episodes  Post-prandial  Hypoglycemic episodes occurred during the day   Overnight periods: stable      ROS: As per HPI and as detailed below: Review of Systems  Constitutional: Negative for fever and weight loss.  Respiratory: Negative for cough and shortness of breath.   Cardiovascular: Negative for chest pain and palpitations.  Gastrointestinal: Negative for diarrhea and nausea.      HISTORY:  Past Medical History:  Past Medical History:  Diagnosis Date  . Cataract 1994   right eye  . Chronic kidney disease   .  Diabetes mellitus without complication ([place])    diagnosed at age 58  . Eye problems   . Heart murmur 1996  . High cholesterol    patient denies but take preventative medicine  . Hypertension   . 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 Sleep apnea    currently not one but looking to get one   . Stroke [facility]) 2016   Past Surgical History:  Past Surgical History:  Procedure Laterality Date  . ANTERIOR APPROACH HEMI HIP ARTHROPLASTY Left 01/01/2019   Procedure: ANTERIOR APPROACH total hip ARTHROPLASTY;  Surgeon: Rod Can, MD;  Location: [place];  Service: Orthopedics;  Laterality: Left;  . CATARACT EXTRACTION Right 1994  . CATARACT EXTRACTION W/ INTRAOCULAR LENS IMPLANT  1994  . EYE SURGERY Right 1991   vitrectomy  . EYE SURGERY Right  1992   scar tissue removed from retina   Social History:  reports that he quit smoking about 27 years ago. He has a 6.00 pack-year smoking history. He has never used smokeless tobacco. He reports that he does not drink alcohol or use drugs. Family History:  Family History  Problem Relation Age of Onset  . Hypertension Mother   . Hyperlipidemia Mother   . Hyperlipidemia Father   . Hypertension Father   . Diabetes Father   . Kidney disease Father        had a kidney transplant   . Stroke Brother   . Diabetes Brother   . Pulmonary fibrosis Paternal Grandmother   . Lung cancer Paternal Grandfather   . Diabetes Daughter   . Colon cancer Neg Hx   . Esophageal cancer Neg Hx   . Rectal cancer Neg Hx   . Stomach cancer Neg Hx      HOME MEDICATIONS: Allergies as of 04/13/2019   No Known Allergies     Medication List       Accurate as of April 13, 2019 11:59 PM. If you have any questions, ask your nurse or doctor.        amLODipine 10 MG tablet Commonly known as: NORVASC Take 1 tablet (10 mg total) by mouth daily.   aspirin EC 81 MG tablet Take 1 tablet (81 mg total) by mouth daily.   atorvastatin 40 MG tablet Commonly known as: LIPITOR TAKE 1 TABLET BY MOUTH ONCE DAILY AT  6PM   carvedilol 25 MG tablet Commonly known as: COREG Take 1 tablet (25 mg total) by mouth 2 (two) times daily with a meal.   cloNIDine 0.1 MG tablet Commonly known as: CATAPRES Take 1 tablet (0.1 mg total) by mouth 2 (two) times daily.   clopidogrel 75 MG tablet Commonly known as: PLAVIX Take 1 tablet (75 mg total) by mouth daily. Aspirin and Plavix for 3 months, after that-stop Aspirin-take Plavix alone What changed: additional instructions   Dexcom G6 Receiver Devi 1 Device by Does not apply route as directed.   Dexcom G6 Sensor Misc 3 Devices by Does not apply route as directed.   Dexcom G6 Transmitter Misc 1 Device by Does not apply route as directed.   fluticasone 50 MCG/ACT  nasal spray Commonly known as: FLONASE Place 2 sprays into both nostrils daily.   furosemide 20 MG tablet Commonly known as: LASIX Take 1 tablet by mouth once daily   HYDROcodone-acetaminophen 5-325 MG tablet Commonly known as: NORCO/VICODIN Take 1 tablet by mouth every 4 (four) hours as needed for moderate pain (pain score 4-6).  insulin lispro 100 UNIT/ML injection Commonly known as: HumaLOG Max daily dose of 100 units via pump DX E10.59 (vials)   lisinopril 20 MG tablet Commonly known as: ZESTRIL Take 1 tablet (20 mg total) by mouth daily.   meclizine 25 MG tablet Commonly known as: ANTIVERT TAKE 1 TABLET BY MOUTH THREE TIMES DAILY AS NEEDED FOR DIZZINESS What changed: See the new instructions.   pantoprazole 40 MG tablet Commonly known as: PROTONIX Take 1 tablet (40 mg total) by mouth daily.   tamsulosin 0.4 MG Caps capsule Commonly known as: FLOMAX Take 1 capsule (0.4 mg total) by mouth daily after breakfast.   Vitamin D (Ergocalciferol) 1.25 MG (50000 UT) Caps capsule Commonly known as: DRISDOL Take 1 capsule (50,000 Units total) by mouth every 7 (seven) days.         DATA REVIEWED:  Lab Results  Component Value Date   HGBA1C 6.8 (A) 04/13/2019   HGBA1C 7.2 (H) 01/01/2019   HGBA1C 8.6 (A) 06/01/2018   Lab Results  Component Value Date   MICROALBUR 28.4 (H) 10/28/2018   LDLCALC 60 10/28/2018   CREATININE 2.00 (H) 03/02/2019   Lab Results  Component Value Date   MICRALBCREAT 53.4 (H) 10/28/2018    Lab Results  Component Value Date   CHOL 107 10/28/2018   HDL 35.10 (L) 10/28/2018   LDLCALC 60 10/28/2018   LDLDIRECT 66.0 01/26/2019   TRIG 58.0 10/28/2018   CHOLHDL 3 10/28/2018         ASSESSMENT / PLAN / RECOMMENDATIONS:   1) Type 1 Diabetes Mellitus, Optimally  controlled, With macrovascular , CKD III and retinopathic complications - Most recent A1c of 6.8 %. Goal A1c < 7.5 %.     - He continues to do well with the pump use. He was  noted to have hyperglycemia with meals which results in auto- boluses followed by hypoglycemia.   - Pt is in the process of reapplying to [place] for patient assistance program  -We have made the following changes to his pump    MEDICATIONS:  Pump   T-Slim      Insulin type   HUMALOG    Basal rate       0000-0000   1.0 u/h               I:C ratio       0000  1:7      Sensitivity      0000 30      AIT      0000  4      Goal       0000  110      EDUCATION / INSTRUCTIONS: BG monitoring instructions: Patient is instructed to check his blood sugars 4 times a day, before meals and bedtime. Call [facility] if: BG persistently < 70 or > 300. I reviewed the Rule of 15 for the treatment of hypoglycemia in detail with the patient. Literature supplied.    F/U in 3 months      Signed electronically by: Mack Guise, MD  [facility]  [address] [place], [place] [address] Phone: [phone] FAX: [phone]   CC: [name], [place] [address] Phone: [phone]  Fax: [phone]  Return to Endocrinology clinic as below: Future Appointments  Date Time Provider [place]  06/03/2019  3:30 PM [name], MD [facility]  06/14/2019  9:30 AM Ward Givens,  NP [facility] None  07/13/2019  1:00 PM [name], MD [facility] None  07/13/2019  3:40 PM Emil Klassen, Melanie Crazier, MD [facility] None

## 2019-04-20 DIAGNOSIS — G4733 Obstructive sleep apnea (adult) (pediatric): Secondary | ICD-10-CM | POA: Diagnosis not present

## 2019-05-07 DIAGNOSIS — E109 Type 1 diabetes mellitus without complications: Secondary | ICD-10-CM | POA: Diagnosis not present

## 2019-05-11 ENCOUNTER — Other Ambulatory Visit: Payer: Self-pay | Admitting: Family Medicine

## 2019-05-11 DIAGNOSIS — R42 Dizziness and giddiness: Secondary | ICD-10-CM

## 2019-05-11 DIAGNOSIS — R600 Localized edema: Secondary | ICD-10-CM

## 2019-05-12 ENCOUNTER — Telehealth: Payer: Self-pay

## 2019-05-14 DIAGNOSIS — E109 Type 1 diabetes mellitus without complications: Secondary | ICD-10-CM | POA: Diagnosis not present

## 2019-05-15 ENCOUNTER — Other Ambulatory Visit (HOSPITAL_COMMUNITY)
Admission: RE | Admit: 2019-05-15 | Discharge: 2019-05-15 | Disposition: A | Discharge status: Home / Self Care | Payer: PPO | Source: Ambulatory Visit | Attending: Neurology | Admitting: Neurology

## 2019-05-15 DIAGNOSIS — Z20822 Contact with and (suspected) exposure to covid-19: Secondary | ICD-10-CM | POA: Insufficient documentation

## 2019-05-15 DIAGNOSIS — Z01812 Encounter for preprocedural laboratory examination: Secondary | ICD-10-CM | POA: Diagnosis not present

## 2019-05-15 LAB — SARS CORONAVIRUS 2 (TAT 6-24 HRS): SARS Coronavirus 2: NEGATIVE

## 2019-05-17 ENCOUNTER — Ambulatory Visit (INDEPENDENT_AMBULATORY_CARE_PROVIDER_SITE_OTHER): Payer: PPO | Admitting: Neurology

## 2019-05-17 ENCOUNTER — Encounter: Payer: Self-pay | Admitting: Internal Medicine

## 2019-05-17 DIAGNOSIS — G4731 Primary central sleep apnea: Secondary | ICD-10-CM

## 2019-05-17 DIAGNOSIS — I6330 Cerebral infarction due to thrombosis of unspecified cerebral artery: Secondary | ICD-10-CM

## 2019-05-17 DIAGNOSIS — R0683 Snoring: Secondary | ICD-10-CM

## 2019-05-17 DIAGNOSIS — G4739 Other sleep apnea: Secondary | ICD-10-CM

## 2019-05-19 DIAGNOSIS — G4733 Obstructive sleep apnea (adult) (pediatric): Secondary | ICD-10-CM | POA: Diagnosis not present

## 2019-05-21 DIAGNOSIS — G4733 Obstructive sleep apnea (adult) (pediatric): Secondary | ICD-10-CM | POA: Diagnosis not present

## 2019-05-25 ENCOUNTER — Other Ambulatory Visit: Payer: Self-pay | Admitting: Family Medicine

## 2019-05-25 ENCOUNTER — Encounter: Payer: Self-pay | Admitting: Internal Medicine

## 2019-05-25 DIAGNOSIS — I10 Essential (primary) hypertension: Secondary | ICD-10-CM

## 2019-05-25 NOTE — Addendum Note (Signed)
Addended by: Larey Seat on: 05/25/2019 05:00 PM   Modules accepted: Orders

## 2019-05-25 NOTE — Procedures (Signed)
PATIENT'S NAME:  Adrian Neal, Adrian Neal. DOB:      1966-01-21      MR#:    [id number]     DATE OF RECORDING: 05/17/2019 CGA REFERRING M.D.:  Ward Givens, NP      PLANS/RECOMMENDATIONS: The patient has been diagnosed with OSA, but developed central apnea on auto-CPAP, and had undergone BiPAP titration study, dated 04-02-2019. BiPAP reached a final pressure of 16/12 cm water , AHI was 6.7/h but central apneas persisted.  He used a Simplus FFM in large size.  study dated 04-02-2019. He had a lot of PLMs, too.  We invited Adrian Neal back for a full night titration to BiPAP, trial of ST and ASV, if needed.  The best result was seen at ow BiPAP pressure of 11/7 cm water and in the return night we should start low BiPAP at 9/5 cm water and increase from there.    The patient endorsed the Epworth Sleepiness Scale at 12/24 points.   The patient's weight 199 pounds with a height of 68 (inches), resulting in a BMI of 30.1 kg/m2. The patient's neck circumference measured 17.5 inches.  CURRENT MEDICATIONS: Norvasc, Lipitor, Coreg, Plavix, Novolog, Prinivil, Antivert, Protonix, Drisdol.    PROCEDURE:  This is a multichannel digital polysomnogram utilizing the SomnoStar 11.2 system.  Electrodes and sensors were applied and monitored per AASM Specifications.   EEG, EOG, Chin and Limb EMG, were sampled at 200 Hz.  ECG, Snore and Nasal Pressure, Thermal Airflow, Respiratory Effort, CPAP Flow and Pressure, Oximetry was sampled at 50 Hz. Digital video and audio were recorded.       BIPAP was initiated at 9/5 cmH20 with heated humidity per AASM split night standards and pressure was advanced to 10/6cmH20 because of hypopneas, apneas and desaturations. Central apneas persisted, and the modality changed to BiPAP ST.  At a final BIPAP pressure of 17/13 cmH20 with ST 12, there was a reduction of the AHI to 1.3 with improvement of complex sleep apnea. The patient slept for 90 minutes, SpO2 Nadir was 93% and there was no  central apnea noted.   Lights Out was at 20:58 and Lights On at 04:59. Total recording time (TRT) was 482 minutes, with a total sleep time (TST) of 430 minutes.  The patient's sleep latency was 9 minutes. REM latency was 174.5 minutes. The sleep efficiency was 89.2 %.    SLEEP ARCHITECTURE: WASO (Time of Wake After Sleep Onset) was 46.5 minutes.  There were 7.5 minutes in Stage N1, 392.5 minutes Stage N2, 0 minutes Stage N3 and 30 minutes in Stage REM.  The percentage of Stage N1 was 1.7%, Stage N2 was 91.3%, Stage N3 was 0% and Stage R (REM sleep) was 7.%.     RESPIRATORY ANALYSIS:  There was a total of 63 respiratory events: 18 obstructive apneas, 19 central apneas and 0 mixed apneas with a total of 37 apneas and an apnea index (AHI) of 5.2 /hour. There were 26 hypopneas with a hypopnea index of 3.6/hour.  The total APNEA/HYPOPNEA INDEX (AHI) was 8.8 /hour.  8 events occurred in REM sleep and 55 events in NREM. The REM AHI was 16 /hour versus a non-REM AHI of 8.25 /hour.  The patient spent 430 minutes of total sleep time in the supine position and 0 minutes in non-supine. The supine AHI was 8.8, versus a non-supine AHI of 0.0.  OXYGEN SATURATION & C02:  The baseline 02 saturation was 96%, with the lowest being 89%. Time spent below 89%  saturation equaled 0 minutes.  The arousals were noted as: 96 were spontaneous, 3 were associated with PLMs, and 17 were associated with respiratory events. The patient had a total of 5 Periodic Limb Movements. The Periodic Limb Movement (PLM) Arousal index was 0.4 /hour. Audio and video analysis did not show any abnormal or unusual movements, behaviors, phonations or vocalizations.   EKG was in keeping with normal sinus rhythm. Post-study, the patient indicated that sleep was the same as usual.   DIAGNOSIS 1. Treatment emergent Central Sleep Apnea, responded to BiPAP ST 17/13 cm water with ST 10 2. BiPAP resolved Sleep Related Hypoxemia 2. The patient was  fitted with a F&P Simplus Medium full-face mask, but reported he preferred a nasal mask- it is unclear why this was not provided.   PLANS/RECOMMENDATIONS: 1. As noted above    DISCUSSION: RV with NP [place].   A follow up appointment will be scheduled in the Sleep Clinic at [facility].   Please call [phone] with any questions.     I certify that I have reviewed the entire raw data recording prior to the issuance of this report in accordance with the Standards of Accreditation of the American Academy of Sleep Medicine (AASM)  Larey Seat, M.D. Diplomat, Tax adviser of Psychiatry and Neurology  Diplomat, Tax adviser of Sleep Medicine Market researcher, [employer] [facility] at [employer]

## 2019-05-26 ENCOUNTER — Telehealth: Payer: Self-pay | Admitting: Neurology

## 2019-05-26 ENCOUNTER — Other Ambulatory Visit: Payer: Self-pay | Admitting: Family Medicine

## 2019-05-26 DIAGNOSIS — R42 Dizziness and giddiness: Secondary | ICD-10-CM

## 2019-05-26 DIAGNOSIS — R600 Localized edema: Secondary | ICD-10-CM

## 2019-05-26 NOTE — Telephone Encounter (Signed)
I called pt. I advised pt that Dr. Brett Fairy reviewed their sleep study results and found that pt was best treated with BiPAP. Dr. Brett Fairy recommends that pt starts BiPAP ST at pressure of 17/13 cm water pressure . I reviewed PAP compliance expectations with the pt. Pt is agreeable to starting a CPAP. I advised pt that an order will be sent to a DME, [facility], and [facility] will call the pt within about one week after they file with the pt's insurance. [facility] will show the pt how to use the machine, fit for masks, and troubleshoot the CPAP if needed. A follow up appt was made for insurance purposes with Ward Givens, NP on April 8,2021 at 9 am. Pt verbalized understanding to arrive 15 minutes early and bring their CPAP. A letter with all of this information in it will be mailed to the pt as a reminder. I verified with the pt that the address we have on file is correct. Pt verbalized understanding of results. Pt had no questions at this time but was encouraged to call back if questions arise. I have sent the order to [facility] and have received confirmation that they have received the order.

## 2019-05-26 NOTE — Telephone Encounter (Signed)
-----   Message from Larey Seat, MD sent at 05/25/2019  5:00 PM EST ----- DIAGNOSIS 1. Treatment emergent Central Sleep Apnea, responded to BiPAP ST 17/13 cm water with ST 10/min.  2. BiPAP resolved Sleep Related Hypoxemia. 3. The patient was fitted with a F&P Simplus Medium full-face mask, but reported he preferred a nasal mask- it is unclear why this was not provided.   PLANS/RECOMMENDATIONS: 1. As noted above    RV with NP [place].

## 2019-06-01 ENCOUNTER — Other Ambulatory Visit: Payer: Self-pay | Admitting: Family Medicine

## 2019-06-01 ENCOUNTER — Encounter: Payer: Self-pay | Admitting: Internal Medicine

## 2019-06-01 DIAGNOSIS — R600 Localized edema: Secondary | ICD-10-CM

## 2019-06-01 NOTE — Progress Notes (Signed)
Virtual Visit via Audio Note  I connected with patient on 06/02/19 at  2:30 PM EST by audio enabled telemedicine application and verified that I am speaking with the correct person using two identifiers.   THIS ENCOUNTER IS A VIRTUAL VISIT DUE TO COVID-19 - PATIENT WAS NOT SEEN IN THE OFFICE. PATIENT HAS CONSENTED TO VIRTUAL VISIT / TELEMEDICINE VISIT   Location of patient: home  Location of provider: office  I discussed the limitations of evaluation and management by telemedicine and the availability of in person appointments. The patient expressed understanding and agreed to proceed.   Subjective:   Adrian Neal is a 54 y.o. male who presents for an Initial Medicare Annual Wellness Visit.  Review of Systems  Home Safety/Smoke Alarms: Feels safe in home. Smoke alarms in place.  Lives w/ wife and 44yo son in 2 story home.  Male:   CCS- 07/02/18.  Recall 7 yrs.  PSA-  Lab Results  Component Value Date   PSA 0.76 06/15/2018      Objective:    Advanced Directives 06/02/2019 01/01/2019 12/31/2018 08/16/2015 04/19/2015 04/10/2015 03/13/2015  Does Patient Have a Medical Advance Directive? No No No No No No No  Would patient like information on creating a medical advance directive? No - Patient declined No - Patient declined No - Patient declined No - patient declined information - No - patient declined information No - patient declined information    Current Medications (verified) Outpatient Encounter Medications as of 06/02/2019  Medication Sig  . amLODipine (NORVASC) 10 MG tablet Take 1 tablet by mouth once daily  . aspirin EC 81 MG tablet Take 1 tablet (81 mg total) by mouth daily.  Marland Kitchen atorvastatin (LIPITOR) 40 MG tablet TAKE 1 TABLET BY MOUTH ONCE DAILY AT  6PM  . carvedilol (COREG) 25 MG tablet Take 1 tablet (25 mg total) by mouth 2 (two) times daily with a meal.  . cloNIDine (CATAPRES) 0.1 MG tablet Take 1 tablet (0.1 mg total) by mouth 2 (two) times daily.  . clopidogrel  (PLAVIX) 75 MG tablet Take 1 tablet (75 mg total) by mouth daily. Aspirin and Plavix for 3 months, after that-stop Aspirin-take Plavix alone (Patient taking differently: Take 75 mg by mouth daily. )  . Continuous Blood Gluc Receiver (DEXCOM G6 RECEIVER) DEVI 1 Device by Does not apply route as directed.  . Continuous Blood Gluc Sensor (DEXCOM G6 SENSOR) MISC 3 Devices by Does not apply route as directed.  . Continuous Blood Gluc Transmit (DEXCOM G6 TRANSMITTER) MISC 1 Device by Does not apply route as directed.  . fluticasone (FLONASE) 50 MCG/ACT nasal spray Place 2 sprays into both nostrils daily.  . furosemide (LASIX) 20 MG tablet Take 1 tablet by mouth once daily  . insulin lispro (HUMALOG) 100 UNIT/ML injection Max daily dose of 100 units via pump DX E10.59 (vials)  . lisinopril (ZESTRIL) 20 MG tablet Take 1 tablet (20 mg total) by mouth daily. (Patient taking differently: Take 10 mg by mouth daily. )  . meclizine (ANTIVERT) 25 MG tablet TAKE 1 TABLET BY MOUTH THREE TIMES DAILY AS NEEDED FOR DIZZINESS  . pantoprazole (PROTONIX) 40 MG tablet Take 1 tablet (40 mg total) by mouth daily.  . tamsulosin (FLOMAX) 0.4 MG CAPS capsule Take 1 capsule (0.4 mg total) by mouth daily after breakfast.  . Vitamin D, Ergocalciferol, (DRISDOL) 1.25 MG (50000 UT) CAPS capsule Take 1 capsule (50,000 Units total) by mouth every 7 (seven) days.  Marland Kitchen HYDROcodone-acetaminophen (NORCO/VICODIN) 5-325  MG tablet Take 1 tablet by mouth every 4 (four) hours as needed for moderate pain (pain score 4-6). (Patient not taking: Reported on 06/02/2019)   No facility-administered encounter medications on file as of 06/02/2019.    Allergies (verified) Patient has no known allergies.   History: Past Medical History:  Diagnosis Date  . Cataract 1994   right eye  . Chronic kidney disease   . Diabetes mellitus without complication ([place])    diagnosed at age 77  . Eye problems   . Heart murmur 1996  . High cholesterol    patient  denies but take preventative medicine  . Hypertension   . 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 Sleep apnea    currently not one but looking to get one   . Stroke [facility]) 2016   Past Surgical History:  Procedure Laterality Date  . ANTERIOR APPROACH HEMI HIP ARTHROPLASTY Left 01/01/2019   Procedure: ANTERIOR APPROACH total hip ARTHROPLASTY;  Surgeon: Rod Can, MD;  Location: [place];  Service: Orthopedics;  Laterality: Left;  . CATARACT EXTRACTION Right 1994  . CATARACT EXTRACTION W/ INTRAOCULAR LENS IMPLANT  1994  . EYE SURGERY Right 1991   vitrectomy  . EYE SURGERY Right 1992   scar tissue removed from retina   Family History  Problem Relation Age of Onset  . Hypertension Mother   . Hyperlipidemia Mother   . Hyperlipidemia Father   . Hypertension Father   . Diabetes Father   . Kidney disease Father        had a kidney transplant   . Stroke Brother   . Diabetes Brother   . Pulmonary fibrosis Paternal Grandmother   . Lung cancer Paternal Grandfather   . Diabetes Daughter   . Colon cancer Neg Hx   . Esophageal cancer Neg Hx   . Rectal cancer Neg Hx   . Stomach cancer Neg Hx    Social History   Socioeconomic History  . Marital status: Married    Spouse name: Not on file  . Number of children: 3  . Years of education: Not on file  . Highest education level: Not on file  Occupational History  . Occupation: Disabled  Tobacco Use  . Smoking status: Former Smoker    Packs/day: 1.00    Years: 6.00    Pack years: 6.00    Quit date: 04/30/1991    Years since quitting: 28.1  . Smokeless tobacco: Never Used  Substance and Sexual Activity  . Alcohol use: No  . Drug use: Never  . Sexual activity: Not on file  Other Topics Concern  . Not on file  Social History Narrative  . Not on file   Social Determinants of Health   Financial Resource Strain:   . Difficulty of Paying Living Expenses: Not on file  Food Insecurity:   . Worried About  Charity fundraiser in the Last Year: Not on file  . Ran Out of Food in the Last Year: Not on file  Transportation Needs:   . Lack of Transportation (Medical): Not on file  . Lack of Transportation (Non-Medical): Not on file  Physical Activity:   . Days of Exercise per Week: Not on file  . Minutes of Exercise per Session: Not on file  Stress:   . Feeling of Stress : Not on file  Social Connections:   . Frequency of Communication with Friends and Family: Not on file  . Frequency of  Social Gatherings with Friends and Family: Not on file  . Attends Religious Services: Not on file  . Active Member of Clubs or Organizations: Not on file  . Attends Archivist Meetings: Not on file  . Marital Status: Not on file   Tobacco Counseling Counseling given: Not Answered   Clinical Intake: Pain : No/denies pain    Activities of Daily Living In your present state of health, do you have any difficulty performing the following activities: 06/02/2019 01/04/2019  Hearing? N N  Vision? N N  Difficulty concentrating or making decisions? N N  Walking or climbing stairs? N N  Dressing or bathing? N N  Doing errands, shopping? N N  Preparing Food and eating ? N -  Using the Toilet? N -  In the past six months, have you accidently leaked urine? N -  Do you have problems with loss of bowel control? N -  Managing your Medications? N -  Managing your Finances? N -  Housekeeping or managing your Housekeeping? N -  Some recent data might be hidden     Immunizations and Health Maintenance Immunization History  Administered Date(s) Administered  . Influenza,inj,Quad PF,6+ Mos 07/15/2018, 01/08/2019  . Pneumococcal Polysaccharide-23 03/02/2019  . Tdap 04/29/2017   Health Maintenance Due  Topic Date Due  . OPHTHALMOLOGY EXAM  11/22/1975    Patient Care Team: [name], MD as PCP - General (Family Medicine)  Indicate any recent Medical Services you may have received from  other than [facility] providers in the past year (date may be approximate).    Assessment:   This is a routine wellness examination for Adrian Neal. Physical assessment deferred to PCP.  Hearing/Vision screen Unable to assess. This visit is enabled though telemedicine due to Covid 19.  Dietary issues and exercise activities discussed: Current Exercise Habits: Home exercise routine, Time (Minutes): 45, Frequency (Times/Week): 2, Weekly Exercise (Minutes/Week): 90, Intensity: Mild, Exercise limited by: None identified Diet (meal preparation, eat out, water intake, caffeinated beverages, dairy products, fruits and vegetables): 24 hr recall Breakfast: cereal  Lunch: ham and cheese sandwich, doritos Dinner: pancakes.  Goals    . DIET - REDUCE SUGAR INTAKE      Depression Screen PHQ 2/9 Scores 06/02/2019 08/16/2015 09/19/2014  PHQ - 2 Score 0 0 0  PHQ- 9 Score - - 0    Fall Risk Fall Risk  06/02/2019 09/09/2018 06/02/2017 08/16/2015 04/19/2015  Falls in the past year? 1 0 No No No  Number falls in past yr: 0 - - - -  Injury with Fall? 1 - - - -  Risk for fall due to : - - - Impaired balance/gait;Medication side effect -  Follow up Education provided;Falls prevention discussed - - - -    Cognitive Function: Ad8 score reviewed for issues:  Issues making decisions:no  Less interest in hobbies / activities:no  Repeat questions, stories (family complaining):no  Trouble using ordinary gadgets (microwave, computer, phone):no  Forgets the month or year: no  Mismanaging finances: no  Remembering appts:no  Daily problems with thinking and/or memory:no Ad8 score is=0         Screening Tests Health Maintenance  Topic Date Due  . OPHTHALMOLOGY EXAM  11/22/1975  . HEMOGLOBIN A1C  10/12/2019  . FOOT EXAM  10/27/2019  . COLONOSCOPY  07/01/2025  . TETANUS/TDAP  04/30/2027  . INFLUENZA VACCINE  Completed  . PNEUMOCOCCAL POLYSACCHARIDE VACCINE AGE 28-64 HIGH RISK  Completed  . HIV Screening   Completed  Plan:    Please schedule your next medicare wellness visit with me in 1 yr.  Continue to eat heart healthy diet (full of fruits, vegetables, whole grains, lean protein, water--limit salt, fat, and sugar intake) and increase physical activity as tolerated.  Continue doing brain stimulating activities (puzzles, reading, adult coloring books, staying active) to keep memory sharp.   Bring a copy of your living will and/or healthcare power of attorney to your next office visit.    I have personally reviewed and noted the following in the patient's chart:   . Medical and social history . Use of alcohol, tobacco or illicit drugs  . Current medications and supplements . Functional ability and status . Nutritional status . Physical activity . Advanced directives . List of other physicians . Hospitalizations, surgeries, and ER visits in previous 12 months . Vitals . Screenings to include cognitive, depression, and falls . Referrals and appointments  In addition, I have reviewed and discussed with patient certain preventive protocols, quality metrics, and best practice recommendations. A written personalized care plan for preventive services as well as general preventive health recommendations were provided to patient.     Naaman Plummer [place], [place]   06/02/2019

## 2019-06-02 ENCOUNTER — Encounter: Payer: Self-pay | Admitting: *Deleted

## 2019-06-02 ENCOUNTER — Other Ambulatory Visit: Payer: Self-pay

## 2019-06-02 ENCOUNTER — Ambulatory Visit (INDEPENDENT_AMBULATORY_CARE_PROVIDER_SITE_OTHER): Payer: PPO | Admitting: *Deleted

## 2019-06-02 DIAGNOSIS — Z0000 Encounter for general adult medical examination without abnormal findings: Secondary | ICD-10-CM

## 2019-06-02 NOTE — Patient Instructions (Signed)
Please schedule your next medicare wellness visit with me in 1 yr.  Continue to eat heart healthy diet (full of fruits, vegetables, whole grains, lean protein, water--limit salt, fat, and sugar intake) and increase physical activity as tolerated.  Continue doing brain stimulating activities (puzzles, reading, adult coloring books, staying active) to keep memory sharp.   Bring a copy of your living will and/or healthcare power of attorney to your next office visit.   Adrian Neal , Thank you for taking time to come for your Medicare Wellness Visit. I appreciate your ongoing commitment to your health goals. Please review the following plan we discussed and let me know if I can assist you in the future.   These are the goals we discussed: Goals    . DIET - REDUCE SUGAR INTAKE       This is a list of the screening recommended for you and due dates:  Health Maintenance  Topic Date Due  . Eye exam for diabetics  11/22/1975  . Hemoglobin A1C  10/12/2019  . Complete foot exam   10/27/2019  . Colon Cancer Screening  07/01/2025  . Tetanus Vaccine  04/30/2027  . Flu Shot  Completed  . Pneumococcal vaccine  Completed  . HIV Screening  Completed    Preventive Care 11-34 Years Old, Male Preventive care refers to lifestyle choices and visits with your health care provider that can promote health and wellness. This includes:  A yearly physical exam. This is also called an annual well check.  Regular dental and eye exams.  Immunizations.  Screening for certain conditions.  Healthy lifestyle choices, such as eating a healthy diet, getting regular exercise, not using drugs or products that contain nicotine and tobacco, and limiting alcohol use. What can I expect for my preventive care visit? Physical exam Your health care provider will check:  Height and weight. These may be used to calculate body mass index (BMI), which is a measurement that tells if you are at a healthy  weight.  Heart rate and blood pressure.  Your skin for abnormal spots. Counseling Your health care provider may ask you questions about:  Alcohol, tobacco, and drug use.  Emotional well-being.  Home and relationship well-being.  Sexual activity.  Eating habits.  Work and work Statistician. What immunizations do I need?  Influenza (flu) vaccine  This is recommended every year. Tetanus, diphtheria, and pertussis (Tdap) vaccine  You may need a Td booster every 10 years. Varicella (chickenpox) vaccine  You may need this vaccine if you have not already been vaccinated. Zoster (shingles) vaccine  You may need this after age 61. Measles, mumps, and rubella (MMR) vaccine  You may need at least one dose of MMR if you were born in 1957 or later. You may also need a second dose. Pneumococcal conjugate (PCV13) vaccine  You may need this if you have certain conditions and were not previously vaccinated. Pneumococcal polysaccharide (PPSV23) vaccine  You may need one or two doses if you smoke cigarettes or if you have certain conditions. Meningococcal conjugate (MenACWY) vaccine  You may need this if you have certain conditions. Hepatitis A vaccine  You may need this if you have certain conditions or if you travel or work in places where you may be exposed to hepatitis A. Hepatitis B vaccine  You may need this if you have certain conditions or if you travel or work in places where you may be exposed to hepatitis B. Haemophilus influenzae type b (Hib) vaccine  You may need this if you have certain risk factors. Human papillomavirus (HPV) vaccine  If recommended by your health care provider, you may need three doses over 6 months. You may receive vaccines as individual doses or as more than one vaccine together in one shot (combination vaccines). Talk with your health care provider about the risks and benefits of combination vaccines. What tests do I need? Blood  tests  Lipid and cholesterol levels. These may be checked every 5 years, or more frequently if you are over 36 years old.  Hepatitis C test.  Hepatitis B test. Screening  Lung cancer screening. You may have this screening every year starting at age 46 if you have a 30-pack-year history of smoking and currently smoke or have quit within the past 15 years.  Prostate cancer screening. Recommendations will vary depending on your family history and other risks.  Colorectal cancer screening. All adults should have this screening starting at age 44 and continuing until age 37. Your health care provider may recommend screening at age 17 if you are at increased risk. You will have tests every 1-10 years, depending on your results and the type of screening test.  Diabetes screening. This is done by checking your blood sugar (glucose) after you have not eaten for a while (fasting). You may have this done every 1-3 years.  Sexually transmitted disease (STD) testing. Follow these instructions at home: Eating and drinking  Eat a diet that includes fresh fruits and vegetables, whole grains, lean protein, and low-fat dairy products.  Take vitamin and mineral supplements as recommended by your health care provider.  Do not drink alcohol if your health care provider tells you not to drink.  If you drink alcohol: ? Limit how much you have to 0-2 drinks a day. ? Be aware of how much alcohol is in your drink. In the [place] one drink equals one 12 oz bottle of beer (355 mL), one 5 oz glass of wine (148 mL), or one 1 oz glass of hard liquor (44 mL). Lifestyle  Take daily care of your teeth and gums.  Stay active. Exercise for at least 30 minutes on 5 or more days each week.  Do not use any products that contain nicotine or tobacco, such as cigarettes, e-cigarettes, and chewing tobacco. If you need help quitting, ask your health care provider.  If you are sexually active, practice safe sex. Use a  condom or other form of protection to prevent STIs (sexually transmitted infections).  Talk with your health care provider about taking a low-dose aspirin every day starting at age 68. What's next?  Go to your health care provider once a year for a well check visit.  Ask your health care provider how often you should have your eyes and teeth checked.  Stay up to date on all vaccines. This information is not intended to replace advice given to you by your health care provider. Make sure you discuss any questions you have with your health care provider. Document Revised: 04/09/2018 Document Reviewed: 04/09/2018 Elsevier Patient Education  2020 [employer].

## 2019-06-03 ENCOUNTER — Ambulatory Visit (INDEPENDENT_AMBULATORY_CARE_PROVIDER_SITE_OTHER): Payer: PPO | Admitting: Family Medicine

## 2019-06-03 ENCOUNTER — Encounter: Payer: Self-pay | Admitting: Family Medicine

## 2019-06-03 VITALS — BP 142/70 | HR 80 | Temp 97.8°F | Ht 68.0 in | Wt 203.0 lb

## 2019-06-03 DIAGNOSIS — M25561 Pain in right knee: Secondary | ICD-10-CM | POA: Diagnosis not present

## 2019-06-03 DIAGNOSIS — R42 Dizziness and giddiness: Secondary | ICD-10-CM

## 2019-06-03 DIAGNOSIS — N1832 Chronic kidney disease, stage 3b: Secondary | ICD-10-CM

## 2019-06-03 DIAGNOSIS — R600 Localized edema: Secondary | ICD-10-CM

## 2019-06-03 DIAGNOSIS — M25562 Pain in left knee: Secondary | ICD-10-CM | POA: Diagnosis not present

## 2019-06-03 DIAGNOSIS — J302 Other seasonal allergic rhinitis: Secondary | ICD-10-CM | POA: Diagnosis not present

## 2019-06-03 DIAGNOSIS — E039 Hypothyroidism, unspecified: Secondary | ICD-10-CM

## 2019-06-03 DIAGNOSIS — I10 Essential (primary) hypertension: Secondary | ICD-10-CM

## 2019-06-03 DIAGNOSIS — E559 Vitamin D deficiency, unspecified: Secondary | ICD-10-CM | POA: Diagnosis not present

## 2019-06-03 MED ORDER — FUROSEMIDE 20 MG PO TABS: 20.0000 mg | ORAL_TABLET | Freq: Every day | ORAL | 3 refills | Status: DC

## 2019-06-03 MED ORDER — FLUTICASONE PROPIONATE 50 MCG/ACT NA SUSP: 2.0000 | Freq: Every day | NASAL | 6 refills | Status: DC

## 2019-06-03 NOTE — Progress Notes (Addendum)
Established Patient Office Visit  Subjective:  Patient ID: Adrian Neal, male    DOB: 1965/05/10  Age: 54 y.o. MRN: [id number]  CC:  Chief Complaint  Patient presents with  . Follow-up    3 month follow up on BP, pt has cpncerns about issues with both knees.     HPI Adrian Neal presents for evaluation and treatment of bilateral knee pain.  No recent injuries.  Pain is worse when he is walking up steps.  Has not tried any medicines for this.  Edema is improved in lower extremities but still persist.  Medication list says that he is taking 20 of Lasix but the pharmacy gave him the 10 mg pills.  Taking just 5 mg of amlodipine.  Needs a refill on his Flonase for his allergy rhinitis.  Recent sleep study.  Past Medical History:  Diagnosis Date  . Cataract 1994   right eye  . Chronic kidney disease   . Diabetes mellitus without complication ([place])    diagnosed at age 30  . Eye problems   . Heart murmur 1996  . High cholesterol    patient denies but take preventative medicine  . Hypertension   . 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 Sleep apnea    currently not one but looking to get one   . Stroke [facility]) 2016    Past Surgical History:  Procedure Laterality Date  . ANTERIOR APPROACH HEMI HIP ARTHROPLASTY Left 01/01/2019   Procedure: ANTERIOR APPROACH total hip ARTHROPLASTY;  Surgeon: Rod Can, MD;  Location: [place];  Service: Orthopedics;  Laterality: Left;  . CATARACT EXTRACTION Right 1994  . CATARACT EXTRACTION W/ INTRAOCULAR LENS IMPLANT  1994  . EYE SURGERY Right 1991   vitrectomy  . EYE SURGERY Right 1992   scar tissue removed from retina    Family History  Problem Relation Age of Onset  . Hypertension Mother   . Hyperlipidemia Mother   . Hyperlipidemia Father   . Hypertension Father   . Diabetes Father   . Kidney disease Father        had a kidney transplant   . Stroke Brother   . Diabetes Brother   . Pulmonary fibrosis  Paternal Grandmother   . Lung cancer Paternal Grandfather   . Diabetes Daughter   . Colon cancer Neg Hx   . Esophageal cancer Neg Hx   . Rectal cancer Neg Hx   . Stomach cancer Neg Hx     Social History   Socioeconomic History  . Marital status: Married    Spouse name: Not on file  . Number of children: 3  . Years of education: Not on file  . Highest education level: Not on file  Occupational History  . Occupation: Disabled  Tobacco Use  . Smoking status: Former Smoker    Packs/day: 1.00    Years: 6.00    Pack years: 6.00    Quit date: 04/30/1991    Years since quitting: 28.1  . Smokeless tobacco: Never Used  Substance and Sexual Activity  . Alcohol use: No  . Drug use: Never  . Sexual activity: Not on file  Other Topics Concern  . Not on file  Social History Narrative  . Not on file   Social Determinants of Health   Financial Resource Strain: Low Risk   . Difficulty of Paying Living Expenses: Not hard at all  Food Insecurity: No Food Insecurity  .  Worried About Charity fundraiser in the Last Year: Never true  . Ran Out of Food in the Last Year: Never true  Transportation Needs:   . Lack of Transportation (Medical): Not on file  . Lack of Transportation (Non-Medical): Not on file  Physical Activity:   . Days of Exercise per Week: Not on file  . Minutes of Exercise per Session: Not on file  Stress:   . Feeling of Stress : Not on file  Social Connections:   . Frequency of Communication with Friends and Family: Not on file  . Frequency of Social Gatherings with Friends and Family: Not on file  . Attends Religious Services: Not on file  . Active Member of Clubs or Organizations: Not on file  . Attends Archivist Meetings: Not on file  . Marital Status: Not on file  Intimate Partner Violence:   . Fear of Current or Ex-Partner: Not on file  . Emotionally Abused: Not on file  . Physically Abused: Not on file  . Sexually Abused: Not on file     Outpatient Medications Prior to Visit  Medication Sig Dispense Refill  . amLODipine (NORVASC) 10 MG tablet Take 1 tablet by mouth once daily (Patient taking differently: 5 mg. ) 90 tablet 0  . aspirin EC 81 MG tablet Take 1 tablet (81 mg total) by mouth daily. 365 tablet 1  . atorvastatin (LIPITOR) 40 MG tablet TAKE 1 TABLET BY MOUTH ONCE DAILY AT  6PM 90 tablet 0  . carvedilol (COREG) 25 MG tablet Take 1 tablet (25 mg total) by mouth 2 (two) times daily with a meal. 60 tablet 2  . cloNIDine (CATAPRES) 0.1 MG tablet Take 1 tablet (0.1 mg total) by mouth 2 (two) times daily. 60 tablet 3  . clopidogrel (PLAVIX) 75 MG tablet Take 1 tablet (75 mg total) by mouth daily. Aspirin and Plavix for 3 months, after that-stop Aspirin-take Plavix alone (Patient taking differently: Take 75 mg by mouth daily. ) 90 tablet 1  . Continuous Blood Gluc Receiver (DEXCOM G6 RECEIVER) DEVI 1 Device by Does not apply route as directed. 1 Device 0  . Continuous Blood Gluc Sensor (DEXCOM G6 SENSOR) MISC 3 Devices by Does not apply route as directed. 3 each 6  . Continuous Blood Gluc Transmit (DEXCOM G6 TRANSMITTER) MISC 1 Device by Does not apply route as directed. 1 each 6  . insulin lispro (HUMALOG) 100 UNIT/ML injection Max daily dose of 100 units via pump DX E10.59 (vials) 90 mL 4  . lisinopril (ZESTRIL) 20 MG tablet Take 1 tablet (20 mg total) by mouth daily. (Patient taking differently: Take 10 mg by mouth daily. ) 90 tablet 3  . meclizine (ANTIVERT) 25 MG tablet TAKE 1 TABLET BY MOUTH THREE TIMES DAILY AS NEEDED FOR DIZZINESS 30 tablet 0  . pantoprazole (PROTONIX) 40 MG tablet Take 1 tablet (40 mg total) by mouth daily. 90 tablet 1  . tamsulosin (FLOMAX) 0.4 MG CAPS capsule Take 1 capsule (0.4 mg total) by mouth daily after breakfast. 90 capsule 1  . Vitamin D, Ergocalciferol, (DRISDOL) 1.25 MG (50000 UT) CAPS capsule Take 1 capsule (50,000 Units total) by mouth every 7 (seven) days. 5 capsule 5  . fluticasone  (FLONASE) 50 MCG/ACT nasal spray Place 2 sprays into both nostrils daily. 16 g 6  . furosemide (LASIX) 20 MG tablet Take 1 tablet by mouth once daily 30 tablet 0  . HYDROcodone-acetaminophen (NORCO/VICODIN) 5-325 MG tablet Take 1 tablet by  mouth every 4 (four) hours as needed for moderate pain (pain score 4-6). (Patient not taking: Reported on 06/02/2019) 42 tablet 0   No facility-administered medications prior to visit.    No Known Allergies  ROS Review of Systems  Constitutional: Negative.   HENT: Positive for postnasal drip.   Eyes: Negative for photophobia and visual disturbance.  Respiratory: Negative.  Negative for chest tightness, shortness of breath and wheezing.   Cardiovascular: Positive for leg swelling. Negative for chest pain and palpitations.  Gastrointestinal: Negative.   Endocrine: Negative for polyphagia and polyuria.  Genitourinary: Negative.   Musculoskeletal: Positive for arthralgias and gait problem.  Allergic/Immunologic: Negative for immunocompromised state.  Neurological: Positive for dizziness.  Hematological: Does not bruise/bleed easily.  Psychiatric/Behavioral: Negative.       Objective:    Physical Exam  Constitutional: He is oriented to person, place, and time. He appears well-developed and well-nourished. No distress.  HENT:  Head: Normocephalic and atraumatic.  Right Ear: External ear normal.  Left Ear: External ear normal.  Eyes: Conjunctivae are normal. Right eye exhibits no discharge. Left eye exhibits no discharge. No scleral icterus.  Neck: No JVD present. No tracheal deviation present.  Cardiovascular: Normal rate, regular rhythm and normal heart sounds.  Pulmonary/Chest: Effort normal and breath sounds normal. No stridor.  Musculoskeletal:        General: Edema present.     Right knee: Swelling present. No deformity or effusion. Decreased range of motion. No medial joint line or lateral joint line tenderness.     Left knee: Swelling  present. No effusion. Decreased range of motion. No medial joint line or lateral joint line tenderness.       Legs:  Neurological: He is alert and oriented to person, place, and time.  Skin: Skin is warm and dry. He is not diaphoretic.  Psychiatric: He has a normal mood and affect. His behavior is normal.    BP (!) 142/70   Pulse 80   Temp 97.8 F (36.6 C) (Tympanic)   Ht 5\' 8"  (1.727 m)   Wt 203 lb (92.1 kg)   SpO2 97%   BMI 30.87 kg/m  Wt Readings from Last 3 Encounters:  06/03/19 203 lb (92.1 kg)  04/13/19 202 lb 12.8 oz (92 kg)  03/02/19 197 lb (89.4 kg)     Health Maintenance Due  Topic Date Due  . OPHTHALMOLOGY EXAM  11/22/1975    There are no preventive care reminders to display for this patient.  Lab Results  Component Value Date   TSH 4.41 06/15/2018   Lab Results  Component Value Date   WBC 8.9 01/08/2019   HGB 7.9 (L) 01/08/2019   HCT 23.9 (L) 01/08/2019   MCV 87.9 01/08/2019   PLT 195 01/08/2019   Lab Results  Component Value Date   NA 140 03/02/2019   K 3.8 03/02/2019   CO2 28 03/02/2019   GLUCOSE 141 (H) 03/02/2019   BUN 32 (H) 03/02/2019   CREATININE 2.00 (H) 03/02/2019   BILITOT 1.4 (H) 01/01/2019   ALKPHOS 98 01/01/2019   AST 18 01/01/2019   ALT 31 01/01/2019   PROT 5.7 (L) 01/01/2019   ALBUMIN 3.2 (L) 01/01/2019   CALCIUM 9.2 03/02/2019   ANIONGAP 9 01/03/2019   GFR 35.09 (L) 03/02/2019   Lab Results  Component Value Date   CHOL 107 10/28/2018   Lab Results  Component Value Date   HDL 35.10 (L) 10/28/2018   Lab Results  Component Value Date  [place] 60 10/28/2018   Lab Results  Component Value Date   TRIG 58.0 10/28/2018   Lab Results  Component Value Date   CHOLHDL 3 10/28/2018   Lab Results  Component Value Date   HGBA1C 6.8 (A) 04/13/2019      Assessment & Plan:   Problem List Items Addressed This Visit      Cardiovascular and Mediastinum   Essential hypertension   Relevant Medications   furosemide  (LASIX) 20 MG tablet     Respiratory   Seasonal allergic rhinitis   Relevant Medications   fluticasone (FLONASE) 50 MCG/ACT nasal spray     Endocrine   Hypothyroidism   Relevant Orders   TSH     Genitourinary   Stage 3b chronic kidney disease   Relevant Orders   Basic metabolic panel     Other   Vertigo   Relevant Orders   Ambulatory referral to Neurology   Vitamin D deficiency   Relevant Orders   VITAMIN D 25 Hydroxy (Vit-D Deficiency, Fractures)   Edema of both legs - Primary   Relevant Medications   furosemide (LASIX) 20 MG tablet   Pain in both knees   Relevant Orders   Uric acid   Ambulatory referral to Sports Medicine      Meds ordered this encounter  Medications  . furosemide (LASIX) 20 MG tablet    Sig: Take 1 tablet (20 mg total) by mouth daily.    Dispense:  90 tablet    Refill:  3  . fluticasone (FLONASE) 50 MCG/ACT nasal spray    Sig: Place 2 sprays into both nostrils daily.    Dispense:  16 g    Refill:  6    Follow-up: Return in about 3 months (around 08/31/2019).  Patient is only taking 5 mg of Norvasc. He had been written for 20 mg of Lasix but says that the pharmacy gave him the 10 mg pills. Will increase Lasix to 20 mg. Following up on his vitamin D deficiency. Labyrinthitis appears to be more chronic in nature. Agrees to go for neurology consultation.  [name], MD

## 2019-06-03 NOTE — Telephone Encounter (Signed)
[employer] called and requested that we re-send the mos recent office visit notes, faxed they received from [place] on 05/31/2019 was not the complete fax (one page was cut off)  Fax number [phone]      Attn: Cyndy

## 2019-06-04 LAB — BASIC METABOLIC PANEL
BUN: 31 mg/dL — ABNORMAL HIGH (ref 6–23)
CO2: 24 mEq/L (ref 19–32)
Calcium: 8.9 mg/dL (ref 8.4–10.5)
Chloride: 106 mEq/L (ref 96–112)
Creatinine, Ser: 1.98 mg/dL — ABNORMAL HIGH (ref 0.40–1.50)
GFR: 35.46 mL/min — ABNORMAL LOW (ref >60.00)
Glucose, Bld: 146 mg/dL — ABNORMAL HIGH (ref 70–99)
Potassium: 3.6 mEq/L (ref 3.5–5.1)
Sodium: 139 mEq/L (ref 135–145)

## 2019-06-04 LAB — TSH: TSH: 4.36 u[IU]/mL (ref 0.35–4.50)

## 2019-06-04 LAB — VITAMIN D 25 HYDROXY (VIT D DEFICIENCY, FRACTURES): VITD: 39.41 ng/mL (ref 30.00–100.00)

## 2019-06-04 LAB — URIC ACID: Uric Acid, Serum: 7.5 mg/dL (ref 4.0–7.8)

## 2019-06-07 ENCOUNTER — Encounter: Payer: Self-pay | Admitting: Neurology

## 2019-06-07 DIAGNOSIS — E109 Type 1 diabetes mellitus without complications: Secondary | ICD-10-CM | POA: Diagnosis not present

## 2019-06-08 ENCOUNTER — Encounter: Payer: Self-pay | Admitting: Family Medicine

## 2019-06-08 ENCOUNTER — Telehealth: Payer: Self-pay

## 2019-06-08 ENCOUNTER — Other Ambulatory Visit: Payer: Self-pay

## 2019-06-08 ENCOUNTER — Ambulatory Visit: Payer: PPO | Admitting: Family Medicine

## 2019-06-08 VITALS — BP 160/74 | HR 82 | Ht 68.0 in | Wt 203.0 lb

## 2019-06-08 DIAGNOSIS — M222X2 Patellofemoral disorders, left knee: Secondary | ICD-10-CM | POA: Diagnosis not present

## 2019-06-08 DIAGNOSIS — M222X1 Patellofemoral disorders, right knee: Secondary | ICD-10-CM | POA: Diagnosis not present

## 2019-06-08 MED ORDER — PENNSAID 2 % EX SOLN: 1.0000 "application " | Freq: Two times a day (BID) | CUTANEOUS | 3 refills | Status: DC

## 2019-06-08 NOTE — Assessment & Plan Note (Signed)
Seems more likely due to weakness from his previous stroke as well as his left hip total arthroplasty.  Less likely for structural or degenerative changes as the source. -Patellar tracking brace. -Provided samples of Pennsaid.  Pennsaid sent in. -Counseled on home exercise therapy and supportive care. -If no improvement consider physical therapy injection or imaging.

## 2019-06-08 NOTE — Patient Instructions (Signed)
Nice to meet you Please try the exercises   Please try the pennsaid  Please continue the recumbent bike Please send me a message in [place] with any questions or updates.  Please see me back in 4 weeks.   --Dr. Raeford Razor

## 2019-06-08 NOTE — Progress Notes (Signed)
Medication Samples have been provided to the patient.  Drug name: Pennsaid       Strength: 2%       Qty: 2 Boxes  LOT: [id number]  Exp.Date: 07/2019  Dosing instructions: Use a pea size amount and rub gently.  The patient has been instructed regarding the correct time, dose, and frequency of taking this medication, including desired effects and most common side effects.   Sherrie George, MA 11:01 AM 06/08/2019

## 2019-06-08 NOTE — Telephone Encounter (Signed)
Received 3 fax requests today requesting to re-fax missing OV notes. Called [phone] and spoke to [place] and explained to her that I have documentation of failed faxes from 05/25/2019. Janace Hoard offered yet another number to fax notes to(have already tried 2) so I will fax requested notes again to new fax number provided. She stated that the only other option would be e-mail or mail.

## 2019-06-08 NOTE — Progress Notes (Signed)
Adrian Neal - 54 y.o. male MRN [id number]  Date of birth: 01/30/1966  SUBJECTIVE:  Including CC & ROS.  Chief Complaint  Patient presents with  . Knee Pain    bilateral knee    RYDER MAN is a 54 y.o. male that is presenting with acute on chronic right and left knee pain.  He denies any specific inciting event.  His knee pain is anterior in nature.  Worse when going up or down stairs or rising from a seated position.  No locking or giving way.  No history of surgery.  Pain can be sharp in nature.  Has history of stroke.  Is limited with his walking secondary to pain.   Independent review of the left hip CT scan from 9/3 shows a angulated and impacted fracture of the proximal left femoral neck   Review of Systems See HPI   HISTORY: Past Medical, Surgical, Social, and Family History Reviewed & Updated per EMR.   Pertinent Historical Findings include:  Past Medical History:  Diagnosis Date  . Cataract 1994   right eye  . Chronic kidney disease   . Diabetes mellitus without complication ([place])    diagnosed at age 84  . Eye problems   . Heart murmur 1996  . High cholesterol    patient denies but take preventative medicine  . Hypertension   . 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 Sleep apnea    currently not one but looking to get one   . Stroke [facility]) 2016    Past Surgical History:  Procedure Laterality Date  . ANTERIOR APPROACH HEMI HIP ARTHROPLASTY Left 01/01/2019   Procedure: ANTERIOR APPROACH total hip ARTHROPLASTY;  Surgeon: Rod Can, MD;  Location: [place];  Service: Orthopedics;  Laterality: Left;  . CATARACT EXTRACTION Right 1994  . CATARACT EXTRACTION W/ INTRAOCULAR LENS IMPLANT  1994  . EYE SURGERY Right 1991   vitrectomy  . EYE SURGERY Right 1992   scar tissue removed from retina    No Known Allergies  Family History  Problem Relation Age of Onset  . Hypertension Mother   . Hyperlipidemia Mother   . Hyperlipidemia  Father   . Hypertension Father   . Diabetes Father   . Kidney disease Father        had a kidney transplant   . Stroke Brother   . Diabetes Brother   . Pulmonary fibrosis Paternal Grandmother   . Lung cancer Paternal Grandfather   . Diabetes Daughter   . Colon cancer Neg Hx   . Esophageal cancer Neg Hx   . Rectal cancer Neg Hx   . Stomach cancer Neg Hx      Social History   Socioeconomic History  . Marital status: Married    Spouse name: Not on file  . Number of children: 3  . Years of education: Not on file  . Highest education level: Not on file  Occupational History  . Occupation: Disabled  Tobacco Use  . Smoking status: Former Smoker    Packs/day: 1.00    Years: 6.00    Pack years: 6.00    Quit date: 04/30/1991    Years since quitting: 28.1  . Smokeless tobacco: Never Used  Substance and Sexual Activity  . Alcohol use: No  . Drug use: Never  . Sexual activity: Not on file  Other Topics Concern  . Not on file  Social History Narrative  . Not on file  Social Determinants of Health   Financial Resource Strain: Low Risk   . Difficulty of Paying Living Expenses: Not hard at all  Food Insecurity: No Food Insecurity  . Worried About Charity fundraiser in the Last Year: Never true  . Ran Out of Food in the Last Year: Never true  Transportation Needs:   . Lack of Transportation (Medical): Not on file  . Lack of Transportation (Non-Medical): Not on file  Physical Activity:   . Days of Exercise per Week: Not on file  . Minutes of Exercise per Session: Not on file  Stress:   . Feeling of Stress : Not on file  Social Connections:   . Frequency of Communication with Friends and Family: Not on file  . Frequency of Social Gatherings with Friends and Family: Not on file  . Attends Religious Services: Not on file  . Active Member of Clubs or Organizations: Not on file  . Attends Archivist Meetings: Not on file  . Marital Status: Not on file  Intimate  Partner Violence:   . Fear of Current or Ex-Partner: Not on file  . Emotionally Abused: Not on file  . Physically Abused: Not on file  . Sexually Abused: Not on file     PHYSICAL EXAM:  VS: BP (!) 160/74   Pulse 82   Ht 5\' 8"  (1.727 m)   Wt 203 lb (92.1 kg)   BMI 30.87 kg/m  Physical Exam Gen: NAD, alert, cooperative with exam, well-appearing ENT: normal lips, normal nasal mucosa,  Eye: normal EOM, normal conjunctiva and lids Skin: no rashes, no areas of induration  Neuro: normal tone, normal sensation to touch Psych:  normal insight, alert and oriented MSK:  Right and left knee: No obvious effusion. Normal range of motion. No tenderness to palpation over the joint line. Normal strength resistance. No instability. Negative McMurray's test. No pain on patellar grind. Neurovascularly intact     ASSESSMENT & PLAN:   Patellofemoral pain syndrome of both knees Seems more likely due to weakness from his previous stroke as well as his left hip total arthroplasty.  Less likely for structural or degenerative changes as the source. -Patellar tracking brace. -Provided samples of Pennsaid.  Pennsaid sent in. -Counseled on home exercise therapy and supportive care. -If no improvement consider physical therapy injection or imaging.

## 2019-06-08 NOTE — Telephone Encounter (Signed)
Pt approved until end of the year with [employer] pt assistance program [phone]

## 2019-06-14 ENCOUNTER — Ambulatory Visit: Payer: PPO | Admitting: Adult Health

## 2019-06-14 DIAGNOSIS — E109 Type 1 diabetes mellitus without complications: Secondary | ICD-10-CM | POA: Diagnosis not present

## 2019-06-21 DIAGNOSIS — G4737 Central sleep apnea in conditions classified elsewhere: Secondary | ICD-10-CM | POA: Diagnosis not present

## 2019-06-21 DIAGNOSIS — G4733 Obstructive sleep apnea (adult) (pediatric): Secondary | ICD-10-CM | POA: Diagnosis not present

## 2019-06-22 NOTE — Telephone Encounter (Signed)
Error

## 2019-06-29 ENCOUNTER — Other Ambulatory Visit: Payer: Self-pay | Admitting: Family Medicine

## 2019-06-29 DIAGNOSIS — N179 Acute kidney failure, unspecified: Secondary | ICD-10-CM

## 2019-06-29 DIAGNOSIS — E782 Mixed hyperlipidemia: Secondary | ICD-10-CM

## 2019-06-29 DIAGNOSIS — E559 Vitamin D deficiency, unspecified: Secondary | ICD-10-CM

## 2019-06-29 DIAGNOSIS — I10 Essential (primary) hypertension: Secondary | ICD-10-CM

## 2019-07-01 DIAGNOSIS — E109 Type 1 diabetes mellitus without complications: Secondary | ICD-10-CM | POA: Diagnosis not present

## 2019-07-06 ENCOUNTER — Encounter: Payer: Self-pay | Admitting: Adult Health

## 2019-07-06 ENCOUNTER — Ambulatory Visit: Payer: PPO | Admitting: Family Medicine

## 2019-07-07 DIAGNOSIS — E109 Type 1 diabetes mellitus without complications: Secondary | ICD-10-CM | POA: Diagnosis not present

## 2019-07-09 ENCOUNTER — Other Ambulatory Visit: Payer: Self-pay | Admitting: Family Medicine

## 2019-07-09 ENCOUNTER — Other Ambulatory Visit: Payer: Self-pay

## 2019-07-09 DIAGNOSIS — Z8673 Personal history of transient ischemic attack (TIA), and cerebral infarction without residual deficits: Secondary | ICD-10-CM

## 2019-07-09 DIAGNOSIS — R42 Dizziness and giddiness: Secondary | ICD-10-CM

## 2019-07-09 MED ORDER — ASPIRIN EC 81 MG PO TBEC: 81.0000 mg | DELAYED_RELEASE_TABLET | Freq: Every day | ORAL | 1 refills | Status: DC

## 2019-07-12 DIAGNOSIS — E109 Type 1 diabetes mellitus without complications: Secondary | ICD-10-CM | POA: Diagnosis not present

## 2019-07-13 ENCOUNTER — Encounter: Payer: Self-pay | Admitting: Internal Medicine

## 2019-07-13 ENCOUNTER — Encounter (INDEPENDENT_AMBULATORY_CARE_PROVIDER_SITE_OTHER): Payer: PPO | Admitting: Ophthalmology

## 2019-07-13 ENCOUNTER — Ambulatory Visit: Payer: PPO | Admitting: Internal Medicine

## 2019-07-13 ENCOUNTER — Other Ambulatory Visit: Payer: Self-pay

## 2019-07-13 VITALS — BP 144/66 | HR 80 | Temp 98.3°F | Ht 68.0 in | Wt 206.8 lb

## 2019-07-13 DIAGNOSIS — E1059 Type 1 diabetes mellitus with other circulatory complications: Secondary | ICD-10-CM

## 2019-07-13 DIAGNOSIS — E10319 Type 1 diabetes mellitus with unspecified diabetic retinopathy without macular edema: Secondary | ICD-10-CM | POA: Diagnosis not present

## 2019-07-13 DIAGNOSIS — S72032D Displaced midcervical fracture of left femur, subsequent encounter for closed fracture with routine healing: Secondary | ICD-10-CM | POA: Diagnosis not present

## 2019-07-13 LAB — POCT GLYCOSYLATED HEMOGLOBIN (HGB A1C): Hemoglobin A1C: 6.8 % — AB (ref 4.0–5.6)

## 2019-07-13 NOTE — Patient Instructions (Signed)
New Pump Settings  Basal rate       0000-0000  1.0 u/h       I:C ratio       0000-0600 1:9   0600-1100 1:7   1100-0000 1:9      Sensitivity       0000  30      Goal       0000  110          - HOW TO TREAT LOW BLOOD SUGARS (Blood sugar LESS THAN 70 MG/DL)  Please follow the RULE OF 15 for the treatment of hypoglycemia treatment (when your (blood sugars are less than 70 mg/dL)    STEP 1: Take 15 grams of carbohydrates when your blood sugar is low, which includes:   3-4 GLUCOSE TABS  OR  3-4 OZ OF JUICE OR REGULAR SODA OR  ONE TUBE OF GLUCOSE GEL     STEP 2: RECHECK blood sugar in 15 MINUTES STEP 3: If your blood sugar is still low at the 15 minute recheck --> then, go back to STEP 1 and treat AGAIN with another 15 grams of carbohydrates.

## 2019-07-13 NOTE — Progress Notes (Signed)
[facility] Note  07/14/2019     CHIEF COMPLAINT Patient presents for Retina Follow Up   HISTORY OF PRESENT ILLNESS: Adrian Neal is a 54 y.o. male who presents to the clinic today for:   HPI    Retina Follow Up    Patient presents with  Diabetic Retinopathy.  In both eyes.  Severity is moderate.  Duration of 6 months.  Since onset it is stable.  I, the attending physician,  performed the HPI with the patient and updated documentation appropriately.          Comments    Patient states vision the same OU. A1c was checked yesterday at 6.8. BS is 293 via insulin pump in office, but patient just ate before coming in.        Last edited by [name], MD on 07/14/2019  9:49 AM. (History)    pt states  Referring physician: [name], MD [place],  [place] [address]  HISTORICAL INFORMATION:   Selected notes from the MEDICAL RECORD NUMBER Referred by Dr. [name] for DM exam LEE:  Ocular Hx-cataract OD, pseudo OS PMH-DM (type 1, A1C: 8.6, takes novolog), heart murmur, HLD, HTN, stroke    CURRENT MEDICATIONS: No current outpatient medications on file. (Ophthalmic Drugs)   No current facility-administered medications for this visit. (Ophthalmic Drugs)   Current Outpatient Medications (Other)  Medication Sig  . amLODipine (NORVASC) 10 MG tablet Take 1 tablet by mouth once daily (Patient taking differently: 5 mg. )  . aspirin EC 81 MG tablet Take 1 tablet (81 mg total) by mouth daily.  Marland Kitchen atorvastatin (LIPITOR) 40 MG tablet TAKE 1 TABLET BY MOUTH ONCE DAILY AT  6  PM  -  PLEASE  KEEP  UPCOMING  APPT  . carvedilol (COREG) 25 MG tablet Take 1 tablet (25 mg total) by mouth 2 (two) times daily with a meal.  . cloNIDine (CATAPRES) 0.1 MG tablet Take 1 tablet by mouth twice daily  . clopidogrel (PLAVIX) 75 MG tablet Take 1 tablet (75 mg total) by mouth daily. Aspirin and Plavix for 3 months, after that-stop  Aspirin-take Plavix alone  . Continuous Blood Gluc Receiver (DEXCOM G6 RECEIVER) DEVI 1 Device by Does not apply route as directed.  . Continuous Blood Gluc Sensor (DEXCOM G6 SENSOR) MISC 3 Devices by Does not apply route as directed.  . Continuous Blood Gluc Transmit (DEXCOM G6 TRANSMITTER) MISC 1 Device by Does not apply route as directed.  . Diclofenac Sodium (PENNSAID) 2 % SOLN Place 1 application onto the skin 2 (two) times daily.  . fluticasone (FLONASE) 50 MCG/ACT nasal spray Place 2 sprays into both nostrils daily.  . furosemide (LASIX) 20 MG tablet Take 1 tablet (20 mg total) by mouth daily.  Marland Kitchen HYDROcodone-acetaminophen (NORCO/VICODIN) 5-325 MG tablet Take 1 tablet by mouth every 4 (four) hours as needed for moderate pain (pain score 4-6).  . Insulin Infusion Pump (T:SLIM INSULIN PUMP) DEVI by Does not apply route.  . insulin lispro (HUMALOG) 100 UNIT/ML injection Max daily dose of 100 units via pump DX E10.59 (vials)  . lisinopril (ZESTRIL) 20 MG tablet Take 1 tablet (20 mg total) by mouth daily. (Patient taking differently: Take 10 mg by mouth daily. )  . meclizine (ANTIVERT) 25 MG tablet TAKE 1 TABLET BY MOUTH THREE TIMES DAILY AS NEEDED FOR DIZZINESS  . pantoprazole (PROTONIX) 40 MG tablet Take 1 tablet (40 mg total) by mouth  daily.  . tamsulosin (FLOMAX) 0.4 MG CAPS capsule Take 1 capsule (0.4 mg total) by mouth daily after breakfast.  . Vitamin D, Ergocalciferol, (DRISDOL) 1.25 MG (50000 UNIT) CAPS capsule Take 1 capsule by mouth once a week   No current facility-administered medications for this visit. (Other)      REVIEW OF SYSTEMS: ROS    Positive for: Endocrine, Cardiovascular, Eyes   Negative for: Constitutional, Gastrointestinal, Neurological, Skin, Genitourinary, Musculoskeletal, HENT, Respiratory, Psychiatric, Allergic/Imm, Heme/Lymph   Last edited by Roselee Nova D, COT on 07/14/2019  9:07 AM. (History)       ALLERGIES No Known Allergies  PAST MEDICAL  HISTORY Past Medical History:  Diagnosis Date  . Cataract 1994   right eye  . Chronic kidney disease   . Diabetes mellitus without complication ([place])    diagnosed at age 56  . Eye problems   . Heart murmur 1996  . High cholesterol    patient denies but take preventative medicine  . Hypertension   . Retinopathy due to secondary diabetes mellitus ([place])    right  . Sleep apnea    currently not one but looking to get one   . Stroke [facility]) 2016   Past Surgical History:  Procedure Laterality Date  . ANTERIOR APPROACH HEMI HIP ARTHROPLASTY Left 01/01/2019   Procedure: ANTERIOR APPROACH total hip ARTHROPLASTY;  Surgeon: Rod Can, MD;  Location: [facility];  Service: Orthopedics;  Laterality: Left;  . CATARACT EXTRACTION Right 1994  . CATARACT EXTRACTION W/ INTRAOCULAR LENS IMPLANT  1994  . EYE SURGERY Right 1991   vitrectomy  . EYE SURGERY Right 1992   scar tissue removed from retina    FAMILY HISTORY Family History  Problem Relation Age of Onset  . Hypertension Mother   . Hyperlipidemia Mother   . Hyperlipidemia Father   . Hypertension Father   . Diabetes Father   . Kidney disease Father        had a kidney transplant   . Stroke Brother   . Diabetes Brother   . Pulmonary fibrosis Paternal Grandmother   . Lung cancer Paternal Grandfather   . Diabetes Daughter   . Colon cancer Neg Hx   . Esophageal cancer Neg Hx   . Rectal cancer Neg Hx   . Stomach cancer Neg Hx     SOCIAL HISTORY Social History   Tobacco Use  . Smoking status: Former Smoker    Packs/day: 1.00    Years: 6.00    Pack years: 6.00    Quit date: 04/30/1991    Years since quitting: 28.2  . Smokeless tobacco: Never Used  Substance Use Topics  . Alcohol use: No  . Drug use: Never         OPHTHALMIC EXAM:  Base Eye Exam    Visual Acuity (Snellen - Linear)      Right Left   Dist cc 20/60 -2 20/40 -1   Dist ph cc NI 20/25 -1   Correction: Glasses       Tonometry (Tonopen, 9:19 AM)      Right  Left   Pressure 15 14       Pupils      Dark Light Shape React APD   Right 3 2 Round Brisk None   Left 3 2 Round Brisk None       Visual Fields (Counting fingers)      Left Right    Full Full       Extraocular Movement  Right Left    Full, Ortho Full, Ortho       Neuro/Psych    Oriented x3: Yes   Mood/Affect: Normal       Dilation    Both eyes: 1.0% Mydriacyl, 2.5% Phenylephrine @ 9:19 AM        Slit Lamp and Fundus Exam    Slit Lamp Exam      Right Left   Lids/Lashes dermatochalasis, Ptosis dermatochalasis, Ptosis, Meibomian gland dysfunction   Conjunctiva/Sclera White and quiet White and quiet   Cornea 1 +PEE, 2+ endopigment, focal band K at 3:00, well healed temporal cataract wounds 1-2+ PEE   Anterior Chamber Deep and quiet Deep and quiet   Iris Round, moderately dilated to 4.68mm, no NVI round, moderately dilated, no NVI   Lens PC IOL in good position 2-3+Nuclear sclerosis, 2-3+ Cortical cataract   Vitreous post vitrectomy vitreous syneresis       Fundus Exam      Right Left   Disc +cupping, mild Pallor, fibrosis at 4:30, peripapillary CR scarring at 9:00, Sharp rim Pink and Sharp, trace fibrosis   C/D Ratio 0.7 0.2   Macula Flat, Blunted foveal reflex, Retinal pigment epithelial mottling, Atrophy, focal peripapillary pigment clump nasal macula, No heme or edema Flat, blunted foveal reflex, mild RPE mottling, trace Epiretinal membrane, No heme or edema   Vessels Vascular attenuation, Tortuous Vascular attenuation   Periphery Attached, 360 PRP, No heme  attached, 360 PRP to arcades, No heme         Refraction    Wearing Rx      Sphere Cylinder Axis Add   Right +2.25 +0.75 088 +2.50   Left +1.50 +0.75 092 +2.50   Type: PAL       Manifest Refraction      Sphere Cylinder Axis Dist VA   Right +1.50 +1.25 067 20/60-2   Left +2.50 +1.25 095 20/30-2          IMAGING AND PROCEDURES  Imaging and Procedures for @TODAY @  OCT, Retina - OU - Both  Eyes       Right Eye Quality was good. Central Foveal Thickness: 208. Progression has been stable. Findings include normal foveal contour, no IRF, no SRF, outer retinal atrophy, epiretinal membrane (Stable from prior).   Left Eye Quality was good. Central Foveal Thickness: 266. Progression has been stable. Findings include normal foveal contour, no SRF, no IRF, epiretinal membrane (Trace cystic changes, no DME ).   Notes *Images captured and stored on drive  Diagnosis / Impression:  Hx of PDR OU No DME OU ERM OU (OD>OS)   Clinical management:  See below  Abbreviations: NFP - Normal foveal profile. CME - cystoid macular edema. PED - pigment epithelial detachment. IRF - intraretinal fluid. SRF - subretinal fluid. EZ - ellipsoid zone. ERM - epiretinal membrane. ORA - outer retinal atrophy. ORT - outer retinal tubulation. SRHM - subretinal hyper-reflective material        Fluorescein Angiography Optos (Transit OD)       Right Eye   Progression has been stable. Early phase findings include delayed filling, staining, window defect, vascular perfusion defect. Mid/Late phase findings include microaneurysm, vascular perfusion defect, window defect, staining, leakage (Low fluorescein signal).   Left Eye   Progression has been stable. Early phase findings include staining, window defect. Mid/Late phase findings include staining, window defect (No leakage).   Notes Images stored on drive;   Impression: PDR OU OS stable with good PRP laser in  place OD with severe staining of nasal fibrosis, but also stable                  ASSESSMENT/PLAN:    ICD-10-CM   1. 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 Retinal edema  H35.81 OCT, Retina - OU - Both Eyes  3. Essential hypertension  I10   4. Hypertensive retinopathy of both eyes  H35.033 Fluorescein Angiography Optos (Transit OD)   5. Pseudophakia  Z96.1   6. Combined forms of age-related cataract of left eye  H25.812     1,2. DM1 w/ Proliferative diabetic retinopathy w/o DME, OU  - history of extensive retinal work done in [place], [place] in 90s per pt -- including PPV w/ laser OD  - saw Dr. Zigmund Daniel once in 2013 and then pt lost insurance and has not had regular eye care since  - exam shows extensive PRP and regressed fibrosis/NV OD -- no significant edema  - good PRP in place OU  - FA (3.17.2021) shows scattered MA, but no active NV  - OCT without diabetic macular edema, both eyes   - BCVA 20/60 OD (stable); 20/25 OS  - OD likely affected by retinal ischemia vs ischemic optic atrophy  - discussed findings and prognosis  - no intervention indicated at this time  - recommend monitoring for now  - f/u in 6-9 mos, sooner prn -- DFE/OCT  3,4. Hypertensive retinopathy OU  - discussed importance of tight BP control  - monitor  5. Pseudophakia OD  - s/p CE/IOL OD by surgeon at [facility] [phone]  - doing well  - monitor  6. Mixed Cataract OS  - The symptoms of cataract, surgical options, and treatments and risks were discussed with patient.  - discussed diagnosis and progression  - not yet visually significant  - monitor for now   Ophthalmic Meds Ordered this visit:  No orders of the defined types were placed in this encounter.      Return in about 9 months (around 04/14/2020) for f/u PDR OU, DFE, OCT.  There are no Patient Instructions on file for this visit.   Explained the diagnoses, plan, and follow up with the patient and they expressed understanding.  Patient expressed understanding of the importance of proper follow up care.   This document serves as a record of services personally performed by Gardiner Sleeper, MD, PhD. It was created on their behalf by Roselee Nova, COMT. The creation of this record is the provider's dictation and/or activities during the visit.  Electronically signed by:  Roselee Nova, COMT 07/18/19 12:10 AM   This document serves as a record of services personally performed by Gardiner Sleeper, MD, PhD. It was created on their behalf by Ernest Mallick, OA, an ophthalmic assistant. The creation of this record is the provider's dictation and/or activities during the visit.    Electronically signed by: Ernest Mallick, OA 03.17.2021 12:10 AM   Gardiner Sleeper, M.D., Ph.D. Diseases & Surgery of the Retina and [place] 07/14/2019   I have reviewed the above documentation for accuracy and completeness, and I agree with the above. Gardiner Sleeper, M.D., Ph.D. 07/18/19 12:10 AM  Abbreviations: M myopia (nearsighted); A astigmatism; H hyperopia (farsighted); P presbyopia; Mrx spectacle prescription;  CTL contact lenses; OD right eye; OS left eye; OU both eyes  XT exotropia; ET esotropia; PEK punctate  epithelial keratitis; PEE punctate epithelial erosions; DES dry eye syndrome; MGD meibomian gland dysfunction; ATs artificial tears; PFAT's preservative free artificial tears; [place] nuclear sclerotic cataract; PSC posterior subcapsular cataract; ERM epi-retinal membrane; PVD posterior vitreous detachment; RD retinal detachment; DM diabetes mellitus; DR diabetic retinopathy; NPDR non-proliferative diabetic retinopathy; PDR proliferative diabetic retinopathy; CSME clinically significant macular edema; DME diabetic macular edema; dbh dot blot hemorrhages; CWS cotton wool spot; POAG primary open angle glaucoma; C/D cup-to-disc ratio; HVF humphrey visual field; GVF goldmann visual field; OCT optical coherence tomography; IOP intraocular pressure; BRVO Branch retinal vein occlusion; CRVO central retinal vein occlusion; CRAO central retinal artery occlusion; BRAO branch retinal artery occlusion; RT retinal tear; SB scleral buckle; PPV pars plana vitrectomy; VH Vitreous hemorrhage; PRP panretinal laser photocoagulation; IVK intravitreal kenalog; VMT  vitreomacular traction; MH Macular hole;  NVD neovascularization of the disc; NVE neovascularization elsewhere; AREDS age related eye disease study; ARMD age related macular degeneration; POAG primary open angle glaucoma; EBMD epithelial/anterior basement membrane dystrophy; ACIOL anterior chamber intraocular lens; IOL intraocular lens; PCIOL posterior chamber intraocular lens; Phaco/IOL phacoemulsification with intraocular lens placement; [place] photorefractive keratectomy; LASIK laser assisted in situ keratomileusis; HTN hypertension; DM diabetes mellitus; COPD chronic obstructive pulmonary disease

## 2019-07-13 NOTE — Progress Notes (Addendum)
Name: Adrian Neal  Age/ Sex: 54 y.o., male   MRN/ DOB: [id number], 02/24/1966     PCP: [name], MD   Reason for Endocrinology Evaluation: Type 1 Diabetes Mellitus  Initial Endocrine Consultative Visit: 06/18/2018    PATIENT IDENTIFIER: [name] is a 54 y.o. male with a past medical history of .HTN,Hyperlipidemia and Hx of CVA  The patient has followed with Endocrinology clinic since 06/18/2018 for consultative assistance with management of his diabetes.  DIABETIC HISTORY:  [name] was diagnosed with T1DM at age 79, he has been on insulin pump since 2012. His hemoglobin A1c has ranged from 7.2% , peaking at 9.0%   In 11/2018 he switched to the T-Slim   SUBJECTIVE:   During the last visit (04/13/2019): we adjusted his I:C ratio during the day   Today (07/13/2019): Mr. [name] is here for a follow up on diabetes management and his new pump.  He checks his blood sugars multiple times daily, preprandial and bedtime. The patient has had hypoglycemic episodes since the last clinic visit, which typically occur 2 x /week - most often occuring during the day after meals. The patient is symptomatic with these episodes.. Otherwise, the patient has not required any recent emergency interventions for hypoglycemia and has not had recent hospitalizations secondary to hyper or hypoglycemic episodes.    This patient with type 1 diabetes is treated with humalog (insulin pump). The clinical list was updated.   Current pump settings    Pump   T-Slim      Insulin type   HUMALOG    Basal rate       0000-0000  1.0 u/h       I:C ratio  0000-1100 1:9   1100-0000 1:7       Sensitivity  0000  25          AIT 0000 5 hours          Goal       0000  110          Type & Model of Pump:T-Slim  Insulin Type: Currently using Humalog   PUMP STATISTICS:2/15-3/16/2021 Average BG Dashboard ): 191 Average BG Control-IQ : 159 BG Readings: 285/ day Average  Daily Carbs (g): 33 Average Total Daily Insulin: 67.42  Average Daily Basal: 24.46(36 %) Average Daily Bolus: 34.49(51 %)     CONTINUOUS GLUCOSE MONITORING RECORD INTERPRETATION    Dates of Recording:3/3-3/16/2021  Sensor description: Dexcom  Results statistics:   Average and SD 166  Time in range  67 %  % Time Above 180 30  % Time Below target <1    Glycemic patterns summary: Hyperglycemia after breakfast and supper   Hyperglycemic episodes  Post-prandial  Hypoglycemic episodes occurred during the day   Overnight periods: stable      ROS: As per HPI and as detailed below: Review of Systems  Cardiovascular: Negative for chest pain and palpitations.  Gastrointestinal: Negative for diarrhea and nausea.      HISTORY:  Past Medical History:  Past Medical History:  Diagnosis Date  . Cataract 1994   right eye  . Chronic kidney disease   . Diabetes mellitus without complication ([place])    diagnosed at age 12  . Eye problems   . Heart murmur 1996  . High cholesterol    patient denies but take preventative medicine  . Hypertension   . Retinopathy due to secondary diabetes mellitus ([place])  right  . Sleep apnea    currently not one but looking to get one   . Stroke [facility]) 2016   Past Surgical History:  Past Surgical History:  Procedure Laterality Date  . ANTERIOR APPROACH HEMI HIP ARTHROPLASTY Left 01/01/2019   Procedure: ANTERIOR APPROACH total hip ARTHROPLASTY;  Surgeon: Rod Can, MD;  Location: [facility];  Service: Orthopedics;  Laterality: Left;  . CATARACT EXTRACTION Right 1994  . CATARACT EXTRACTION W/ INTRAOCULAR LENS IMPLANT  1994  . EYE SURGERY Right 1991   vitrectomy  . EYE SURGERY Right 1992   scar tissue removed from retina   Social History:  reports that he quit smoking about 28 years ago. He has a 6.00 pack-year smoking history. He has never used smokeless tobacco. He reports that he does not drink alcohol or use drugs. Family History:   Family History  Problem Relation Age of Onset  . Hypertension Mother   . Hyperlipidemia Mother   . Hyperlipidemia Father   . Hypertension Father   . Diabetes Father   . Kidney disease Father        had a kidney transplant   . Stroke Brother   . Diabetes Brother   . Pulmonary fibrosis Paternal Grandmother   . Lung cancer Paternal Grandfather   . Diabetes Daughter   . Colon cancer Neg Hx   . Esophageal cancer Neg Hx   . Rectal cancer Neg Hx   . Stomach cancer Neg Hx      HOME MEDICATIONS: Allergies as of 07/13/2019   No Known Allergies     Medication List       Accurate as of July 13, 2019  3:52 PM. If you have any questions, ask your nurse or doctor.        amLODipine 10 MG tablet Commonly known as: NORVASC Take 1 tablet by mouth once daily What changed:  how much to take how to take this when to take this   aspirin EC 81 MG tablet Take 1 tablet (81 mg total) by mouth daily.   atorvastatin 40 MG tablet Commonly known as: LIPITOR TAKE 1 TABLET BY MOUTH ONCE DAILY AT  6  PM  -  PLEASE  KEEP  UPCOMING  APPT   carvedilol 25 MG tablet Commonly known as: COREG Take 1 tablet (25 mg total) by mouth 2 (two) times daily with a meal.   cloNIDine 0.1 MG tablet Commonly known as: CATAPRES Take 1 tablet by mouth twice daily   clopidogrel 75 MG tablet Commonly known as: PLAVIX Take 1 tablet (75 mg total) by mouth daily. Aspirin and Plavix for 3 months, after that-stop Aspirin-take Plavix alone What changed: additional instructions   Dexcom G6 Receiver Devi 1 Device by Does not apply route as directed.   Dexcom G6 Sensor Misc 3 Devices by Does not apply route as directed.   Dexcom G6 Transmitter Misc 1 Device by Does not apply route as directed.   fluticasone 50 MCG/ACT nasal spray Commonly known as: FLONASE Place 2 sprays into both nostrils daily.   furosemide 20 MG tablet Commonly known as: LASIX Take 1 tablet (20 mg total) by mouth daily.    HYDROcodone-acetaminophen 5-325 MG tablet Commonly known as: NORCO/VICODIN Take 1 tablet by mouth every 4 (four) hours as needed for moderate pain (pain score 4-6).   insulin lispro 100 UNIT/ML injection Commonly known as: HumaLOG Max daily dose of 100 units via pump DX E10.59 (vials)   lisinopril 20 MG tablet Commonly  known as: ZESTRIL Take 1 tablet (20 mg total) by mouth daily. What changed: how much to take   meclizine 25 MG tablet Commonly known as: ANTIVERT TAKE 1 TABLET BY MOUTH THREE TIMES DAILY AS NEEDED FOR DIZZINESS   pantoprazole 40 MG tablet Commonly known as: PROTONIX Take 1 tablet (40 mg total) by mouth daily.   [place] 2 % Soln Generic drug: Diclofenac Sodium Place 1 application onto the skin 2 (two) times daily.   T:slim Insulin Pump Devi by Does not apply route.   tamsulosin 0.4 MG Caps capsule Commonly known as: FLOMAX Take 1 capsule (0.4 mg total) by mouth daily after breakfast.   Vitamin D (Ergocalciferol) 1.25 MG (50000 UNIT) Caps capsule Commonly known as: DRISDOL Take 1 capsule by mouth once a week       DATA REVIEWED:  Lab Results  Component Value Date   HGBA1C 6.8 (A) 07/13/2019   HGBA1C 6.8 (A) 04/13/2019   HGBA1C 7.2 (H) 01/01/2019   Lab Results  Component Value Date   MICROALBUR 28.4 (H) 10/28/2018   LDLCALC 60 10/28/2018   CREATININE 1.98 (H) 06/03/2019   Lab Results  Component Value Date   MICRALBCREAT 53.4 (H) 10/28/2018    Lab Results  Component Value Date   CHOL 107 10/28/2018   HDL 35.10 (L) 10/28/2018   LDLCALC 60 10/28/2018   LDLDIRECT 66.0 01/26/2019   TRIG 58.0 10/28/2018   CHOLHDL 3 10/28/2018         ASSESSMENT / PLAN / RECOMMENDATIONS:   1) Type 1 Diabetes Mellitus, Optimally controlled, With CKD III, retinopathic and  macrovascular complications - Most recent A1c of 6.8 %. Goal A1c < 7.5 %.     - He continues to do well with the pump use. He was noted to have hyperglycemia after breakfast followed  by hypoglycemia, pt tends to overestimate CHO intake for breakfast because he wants more insulin, so I am going to tighten his I:C ratio for breakfast, I have advised him to enter the actual amounts consumed., this could be the reason for hypoglycemia later.    -We have made the following changes to his pump    MEDICATIONS:  Pump   T-Slim      Insulin type   HUMALOG    Basal rate       0000-0000   1.0 u/h               I:C ratio   0000-0600 1:9   0600-1100  1:7   1100-0000 1:9      Sensitivity      0000 30      AIT      0000  5      Goal       0000  110      EDUCATION / INSTRUCTIONS: BG monitoring instructions: Patient is instructed to check his blood sugars 4 times a day, before meals and bedtime. Call [facility] if: BG persistently < 70 or > 300. I reviewed the Rule of 15 for the treatment of hypoglycemia in detail with the patient. Literature supplied.    F/U in 4 months      Signed electronically by: Mack Guise, MD  [facility]  [address] [address] [place], [place] [address] Phone: [phone] FAX: [phone]   CC: [name], [place] [address] Phone: [phone]  Fax: [phone]  Return to Endocrinology clinic as below: Future Appointments  Date Time Provider [place]  07/14/2019  9:00 AM [name], MD [facility] None  07/20/2019  2:50 PM Pieter Partridge, DO [facility] None  08/05/2019  9:00 AM Ward Givens, NP [facility] None

## 2019-07-14 ENCOUNTER — Ambulatory Visit (INDEPENDENT_AMBULATORY_CARE_PROVIDER_SITE_OTHER): Payer: PPO | Admitting: Ophthalmology

## 2019-07-14 ENCOUNTER — Encounter (INDEPENDENT_AMBULATORY_CARE_PROVIDER_SITE_OTHER): Payer: Self-pay | Admitting: Ophthalmology

## 2019-07-14 DIAGNOSIS — H3581 Retinal edema: Secondary | ICD-10-CM

## 2019-07-14 DIAGNOSIS — E103593 Type 1 diabetes mellitus with proliferative diabetic retinopathy without macular edema, bilateral: Secondary | ICD-10-CM

## 2019-07-14 DIAGNOSIS — H25812 Combined forms of age-related cataract, left eye: Secondary | ICD-10-CM | POA: Diagnosis not present

## 2019-07-14 DIAGNOSIS — I10 Essential (primary) hypertension: Secondary | ICD-10-CM

## 2019-07-14 DIAGNOSIS — H35033 Hypertensive retinopathy, bilateral: Secondary | ICD-10-CM

## 2019-07-14 DIAGNOSIS — Z961 Presence of intraocular lens: Secondary | ICD-10-CM | POA: Diagnosis not present

## 2019-07-19 DIAGNOSIS — G4737 Central sleep apnea in conditions classified elsewhere: Secondary | ICD-10-CM | POA: Diagnosis not present

## 2019-07-19 DIAGNOSIS — G4733 Obstructive sleep apnea (adult) (pediatric): Secondary | ICD-10-CM | POA: Diagnosis not present

## 2019-07-19 NOTE — Progress Notes (Deleted)
NEUROLOGY CONSULTATION NOTE  Adrian Neal MRN: [id number] DOB: [birth year]  Referring provider: [name], MD Primary care provider: [name], MD  Reason for consult:  vertigo  HISTORY OF PRESENT ILLNESS: Adrian Neal. [name] is a 54 year old ***-handed white male with HTN, DM with retinopathy, OSA and history of left brachium pontis stroke who presents for vertigo.  History supplemented by hospital and prior neurology notes.  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  TTE showed LVH with EF 60-65%.  He has been on ASA and statin therapy since then.  CTA of head and neck from 01/20/2015 showed advanced intracranial stenosis with high-grade stenosis of left ICA siphon, moderate stenosis of right MCA and ACA origins, moderate irregluarity and stenosis of the bilateral PCA branches, mild stenosis of left MCA M1 segment but no major circle of Willis branch occlusion or proximal posterior circulation stenosis and no significant stenosis of the extracranial arteries.    LABS: 01/26/2019: direct LDL 66 06/03/2019:  TSH 4.36 07/13/2019:  Hgb A1c 6.8  PAST MEDICAL HISTORY: Past Medical History:  Diagnosis Date  . Cataract 1994   right eye  . Chronic kidney disease   . Diabetes mellitus without complication ([place])    diagnosed at age 38  . Eye problems   . Heart murmur 1996  . High cholesterol    patient denies but take preventative medicine  . Hypertension   . Retinopathy due to secondary diabetes mellitus ([place])    right  . Sleep apnea    currently not one but looking to get one   . Stroke [facility]) 2016    PAST SURGICAL HISTORY: Past Surgical History:  Procedure Laterality Date  . ANTERIOR APPROACH HEMI HIP ARTHROPLASTY Left 01/01/2019   Procedure: ANTERIOR APPROACH total hip ARTHROPLASTY;  Surgeon: Rod Can, MD;  Location: [place];  Service: Orthopedics;  Laterality: Left;  . CATARACT EXTRACTION Right 1994  . CATARACT  EXTRACTION W/ INTRAOCULAR LENS IMPLANT  1994  . EYE SURGERY Right 1991   vitrectomy  . EYE SURGERY Right 1992   scar tissue removed from retina    MEDICATIONS: Current Outpatient Medications on File Prior to Visit  Medication Sig Dispense Refill  . amLODipine (NORVASC) 10 MG tablet Take 1 tablet by mouth once daily (Patient taking differently: 5 mg. ) 90 tablet 0  . aspirin EC 81 MG tablet Take 1 tablet (81 mg total) by mouth daily. 365 tablet 1  . atorvastatin (LIPITOR) 40 MG tablet TAKE 1 TABLET BY MOUTH ONCE DAILY AT  6  PM  -  PLEASE  KEEP  UPCOMING  APPT 90 tablet 0  . carvedilol (COREG) 25 MG tablet Take 1 tablet (25 mg total) by mouth 2 (two) times daily with a meal. 60 tablet 2  . cloNIDine (CATAPRES) 0.1 MG tablet Take 1 tablet by mouth twice daily 60 tablet 0  . clopidogrel (PLAVIX) 75 MG tablet Take 1 tablet (75 mg total) by mouth daily. Aspirin and Plavix for 3 months, after that-stop Aspirin-take Plavix alone 90 tablet 1  . Continuous Blood Gluc Receiver (DEXCOM G6 RECEIVER) DEVI 1 Device by Does not apply route as directed. 1 Device 0  . Continuous Blood Gluc Sensor (DEXCOM G6 SENSOR) MISC 3 Devices by Does not apply route as directed. 3 each 6  . Continuous Blood Gluc Transmit (DEXCOM G6 TRANSMITTER) MISC 1 Device by Does not apply route as directed. 1 each 6  .  Diclofenac Sodium (PENNSAID) 2 % SOLN Place 1 application onto the skin 2 (two) times daily. 112 g 3  . fluticasone (FLONASE) 50 MCG/ACT nasal spray Place 2 sprays into both nostrils daily. 16 g 6  . furosemide (LASIX) 20 MG tablet Take 1 tablet (20 mg total) by mouth daily. 90 tablet 3  . HYDROcodone-acetaminophen (NORCO/VICODIN) 5-325 MG tablet Take 1 tablet by mouth every 4 (four) hours as needed for moderate pain (pain score 4-6). 42 tablet 0  . Insulin Infusion Pump (T:SLIM INSULIN PUMP) DEVI by Does not apply route.    . insulin lispro (HUMALOG) 100 UNIT/ML injection Max daily dose of 100 units via pump DX E10.59  (vials) 90 mL 4  . lisinopril (ZESTRIL) 20 MG tablet Take 1 tablet (20 mg total) by mouth daily. (Patient taking differently: Take 10 mg by mouth daily. ) 90 tablet 3  . meclizine (ANTIVERT) 25 MG tablet TAKE 1 TABLET BY MOUTH THREE TIMES DAILY AS NEEDED FOR DIZZINESS 30 tablet 0  . pantoprazole (PROTONIX) 40 MG tablet Take 1 tablet (40 mg total) by mouth daily. 90 tablet 1  . tamsulosin (FLOMAX) 0.4 MG CAPS capsule Take 1 capsule (0.4 mg total) by mouth daily after breakfast. 90 capsule 1  . Vitamin D, Ergocalciferol, (DRISDOL) 1.25 MG (50000 UNIT) CAPS capsule Take 1 capsule by mouth once a week 5 capsule 0   No current facility-administered medications on file prior to visit.    ALLERGIES: No Known Allergies  FAMILY HISTORY: Family History  Problem Relation Age of Onset  . Hypertension Mother   . Hyperlipidemia Mother   . Hyperlipidemia Father   . Hypertension Father   . Diabetes Father   . Kidney disease Father        had a kidney transplant   . Stroke Brother   . Diabetes Brother   . Pulmonary fibrosis Paternal Grandmother   . Lung cancer Paternal Grandfather   . Diabetes Daughter   . Colon cancer Neg Hx   . Esophageal cancer Neg Hx   . Rectal cancer Neg Hx   . Stomach cancer Neg Hx    ***.  SOCIAL HISTORY: Social History   Socioeconomic History  . Marital status: Married    Spouse name: Not on file  . Number of children: 3  . Years of education: Not on file  . Highest education level: Not on file  Occupational History  . Occupation: Disabled  Tobacco Use  . Smoking status: Former Smoker    Packs/day: 1.00    Years: 6.00    Pack years: 6.00    Quit date: 04/30/1991    Years since quitting: 28.2  . Smokeless tobacco: Never Used  Substance and Sexual Activity  . Alcohol use: No  . Drug use: Never  . Sexual activity: Not on file  Other Topics Concern  . Not on file  Social History Narrative  . Not on file   Social Determinants of Health   Financial  Resource Strain: Low Risk   . Difficulty of Paying Living Expenses: Not hard at all  Food Insecurity: No Food Insecurity  . Worried About Charity fundraiser in the Last Year: Never true  . Ran Out of Food in the Last Year: Never true  Transportation Needs:   . Lack of Transportation (Medical):   Marland Kitchen Lack of Transportation (Non-Medical):   Physical Activity:   . Days of Exercise per Week:   . Minutes of Exercise per Session:  Stress:   . Feeling of Stress :   Social Connections:   . Frequency of Communication with Friends and Family:   . Frequency of Social Gatherings with Friends and Family:   . Attends Religious Services:   . Active Member of Clubs or Organizations:   . Attends Archivist Meetings:   Marland Kitchen Marital Status:   Intimate Partner Violence:   . Fear of Current or Ex-Partner:   . Emotionally Abused:   Marland Kitchen Physically Abused:   . Sexually Abused:     REVIEW OF SYSTEMS: Constitutional: No fevers, chills, or sweats, no generalized fatigue, change in appetite Eyes: No visual changes, double vision, eye pain Ear, nose and throat: No hearing loss, ear pain, nasal congestion, sore throat Cardiovascular: No chest pain, palpitations Respiratory:  No shortness of breath at rest or with exertion, wheezes GastrointestinaI: No nausea, vomiting, diarrhea, abdominal pain, fecal incontinence Genitourinary:  No dysuria, urinary retention or frequency Musculoskeletal:  No neck pain, back pain Integumentary: No rash, pruritus, skin lesions Neurological: as above Psychiatric: No depression, insomnia, anxiety Endocrine: No palpitations, fatigue, diaphoresis, mood swings, change in appetite, change in weight, increased thirst Hematologic/Lymphatic:  No purpura, petechiae. Allergic/Immunologic: no itchy/runny eyes, nasal congestion, recent allergic reactions, rashes  PHYSICAL EXAM: *** General: No acute distress.  Patient appears ***-groomed.  *** Head:  Normocephalic/atraumatic  Eyes:  fundi examined but not visualized Neck: supple, no paraspinal tenderness, full range of motion Back: No paraspinal tenderness Heart: regular rate and rhythm Lungs: Clear to auscultation bilaterally. Vascular: No carotid bruits. Neurological Exam: Mental status: alert and oriented to person, place, and time, recent and remote memory intact, fund of knowledge intact, attention and concentration intact, speech fluent and not dysarthric, language intact. Cranial nerves: CN I: not tested CN II: pupils equal, round and reactive to light, visual fields intact CN III, IV, VI:  full range of motion, no nystagmus, no ptosis CN V: facial sensation intact CN VII: upper and lower face symmetric CN VIII: hearing intact CN IX, X: gag intact, uvula midline CN XI: sternocleidomastoid and trapezius muscles intact CN XII: tongue midline Bulk & Tone: normal, no fasciculations. Motor:  5/5 throughout *** Sensation:  Pinprick *** temperature *** and vibration sensation intact.  ***. Deep Tendon Reflexes:  2+ throughout, *** toes downgoing.  *** Finger to nose testing:  Without dysmetria.  *** Heel to shin:  Without dysmetria.  *** Gait:  Normal station and stride.  Able to turn and tandem walk. Romberg ***.  IMPRESSION: ***  PLAN: ***  Thank you for allowing me to take part in the care of this patient.  Metta Clines, DO  CC: ***

## 2019-07-20 ENCOUNTER — Ambulatory Visit: Payer: PPO | Admitting: Neurology

## 2019-07-26 ENCOUNTER — Encounter: Payer: Self-pay | Admitting: Family Medicine

## 2019-07-26 ENCOUNTER — Other Ambulatory Visit: Payer: Self-pay

## 2019-07-26 DIAGNOSIS — N179 Acute kidney failure, unspecified: Secondary | ICD-10-CM

## 2019-07-26 DIAGNOSIS — I10 Essential (primary) hypertension: Secondary | ICD-10-CM

## 2019-07-26 MED ORDER — LISINOPRIL 20 MG PO TABS: 20.0000 mg | ORAL_TABLET | Freq: Every day | ORAL | 0 refills | Status: DC

## 2019-08-05 ENCOUNTER — Other Ambulatory Visit: Payer: Self-pay

## 2019-08-05 ENCOUNTER — Encounter: Payer: Self-pay | Admitting: Adult Health

## 2019-08-05 ENCOUNTER — Ambulatory Visit: Payer: PPO | Admitting: Adult Health

## 2019-08-05 VITALS — BP 149/71 | HR 81 | Temp 97.9°F | Wt 200.0 lb

## 2019-08-05 DIAGNOSIS — G4733 Obstructive sleep apnea (adult) (pediatric): Secondary | ICD-10-CM

## 2019-08-05 DIAGNOSIS — Z9989 Dependence on other enabling machines and devices: Secondary | ICD-10-CM | POA: Diagnosis not present

## 2019-08-05 NOTE — Progress Notes (Signed)
PATIENT: Adrian Neal DOB: 1966/03/23  REASON FOR VISIT: follow up HISTORY FROM: patient  HISTORY OF PRESENT ILLNESS: Today 08/05/19:  [name] is a 54 year old male with a history of obstructive sleep apnea on BiPAP.  His download indicates that he uses machine 26 out of 30 days for compliance of 87%.  He uses machine greater than 4 hours 25 days for compliance of 83%.  On average he uses his machine 8 hours and 16 minutes.  His residual AHI is 11.2 on 17/13 centimeters of water.  Leak in the 95th percentile is 36.7 L/min.  HISTORY 12/03/18:  Ms. Adrian Neal is a 54 year old male with a history of obstructive sleep apnea on CPAP.  His CPAP download indicates that he uses machine 30 out of 30 days for compliance of 100%.  He uses machine greater than 4 hours each night.  On average he uses his machine 8 hours and 48 minutes.  His residual AHI is 11.1 on 5 to 15 cm of water with EPR of 3.  His leak in the 95th percentile is 11.1 L/min.  His average pressure is 12.1.  He reports that some nights he does feel the mask leaking especially when he lays on his right side.  He reports that he can see the benefit however is been minimal.  He returns today for evaluation.   REVIEW OF SYSTEMS: Out of a complete 14 system review of symptoms, the patient complains only of the following symptoms, and all other reviewed systems are negative.  FSS ESS  ALLERGIES: No Known Allergies  HOME MEDICATIONS: Outpatient Medications Prior to Visit  Medication Sig Dispense Refill  . amLODipine (NORVASC) 10 MG tablet Take 1 tablet by mouth once daily (Patient taking differently: 5 mg. ) 90 tablet 0  . aspirin EC 81 MG tablet Take 1 tablet (81 mg total) by mouth daily. 365 tablet 1  . atorvastatin (LIPITOR) 40 MG tablet TAKE 1 TABLET BY MOUTH ONCE DAILY AT  6  PM  -  PLEASE  KEEP  UPCOMING  APPT 90 tablet 0  . carvedilol (COREG) 25 MG tablet Take 1 tablet (25 mg total) by mouth 2 (two) times daily  with a meal. 60 tablet 2  . cloNIDine (CATAPRES) 0.1 MG tablet Take 1 tablet by mouth twice daily 60 tablet 0  . clopidogrel (PLAVIX) 75 MG tablet Take 1 tablet (75 mg total) by mouth daily. Aspirin and Plavix for 3 months, after that-stop Aspirin-take Plavix alone 90 tablet 1  . Continuous Blood Gluc Receiver (DEXCOM G6 RECEIVER) DEVI 1 Device by Does not apply route as directed. 1 Device 0  . Continuous Blood Gluc Sensor (DEXCOM G6 SENSOR) MISC 3 Devices by Does not apply route as directed. 3 each 6  . Continuous Blood Gluc Transmit (DEXCOM G6 TRANSMITTER) MISC 1 Device by Does not apply route as directed. 1 each 6  . Diclofenac Sodium (PENNSAID) 2 % SOLN Place 1 application onto the skin 2 (two) times daily. 112 g 3  . fluticasone (FLONASE) 50 MCG/ACT nasal spray Place 2 sprays into both nostrils daily. 16 g 6  . furosemide (LASIX) 20 MG tablet Take 1 tablet (20 mg total) by mouth daily. 90 tablet 3  . HYDROcodone-acetaminophen (NORCO/VICODIN) 5-325 MG tablet Take 1 tablet by mouth every 4 (four) hours as needed for moderate pain (pain score 4-6). 42 tablet 0  . Insulin Infusion Pump (T:SLIM INSULIN PUMP) DEVI by Does not apply route.    Marland Kitchen  insulin lispro (HUMALOG) 100 UNIT/ML injection Max daily dose of 100 units via pump DX E10.59 (vials) 90 mL 4  . lisinopril (ZESTRIL) 20 MG tablet Take 1 tablet (20 mg total) by mouth daily. 90 tablet 0  . meclizine (ANTIVERT) 25 MG tablet TAKE 1 TABLET BY MOUTH THREE TIMES DAILY AS NEEDED FOR DIZZINESS 30 tablet 0  . pantoprazole (PROTONIX) 40 MG tablet Take 1 tablet (40 mg total) by mouth daily. 90 tablet 1  . tamsulosin (FLOMAX) 0.4 MG CAPS capsule Take 1 capsule (0.4 mg total) by mouth daily after breakfast. 90 capsule 1  . Vitamin D, Ergocalciferol, (DRISDOL) 1.25 MG (50000 UNIT) CAPS capsule Take 1 capsule by mouth once a week 5 capsule 0   No facility-administered medications prior to visit.    PAST MEDICAL HISTORY: Past Medical History:  Diagnosis  Date  . Cataract 1994   right eye  . Chronic kidney disease   . Diabetes mellitus without complication ([place])    diagnosed at age 45  . Eye problems   . Heart murmur 1996  . High cholesterol    patient denies but take preventative medicine  . Hypertension   . Retinopathy due to secondary diabetes mellitus ([place])    right  . Sleep apnea    currently not one but looking to get one   . Stroke [facility]) 2016    PAST SURGICAL HISTORY: Past Surgical History:  Procedure Laterality Date  . ANTERIOR APPROACH HEMI HIP ARTHROPLASTY Left 01/01/2019   Procedure: ANTERIOR APPROACH total hip ARTHROPLASTY;  Surgeon: Rod Can, MD;  Location: [place];  Service: Orthopedics;  Laterality: Left;  . CATARACT EXTRACTION Right 1994  . CATARACT EXTRACTION W/ INTRAOCULAR LENS IMPLANT  1994  . EYE SURGERY Right 1991   vitrectomy  . EYE SURGERY Right 1992   scar tissue removed from retina    FAMILY HISTORY: Family History  Problem Relation Age of Onset  . Hypertension Mother   . Hyperlipidemia Mother   . Hyperlipidemia Father   . Hypertension Father   . Diabetes Father   . Kidney disease Father        had a kidney transplant   . Stroke Brother   . Diabetes Brother   . Pulmonary fibrosis Paternal Grandmother   . Lung cancer Paternal Grandfather   . Diabetes Daughter   . Colon cancer Neg Hx   . Esophageal cancer Neg Hx   . Rectal cancer Neg Hx   . Stomach cancer Neg Hx     SOCIAL HISTORY: Social History   Socioeconomic History  . Marital status: Married    Spouse name: Not on file  . Number of children: 3  . Years of education: Not on file  . Highest education level: Not on file  Occupational History  . Occupation: Disabled  Tobacco Use  . Smoking status: Former Smoker    Packs/day: 1.00    Years: 6.00    Pack years: 6.00    Quit date: 04/30/1991    Years since quitting: 28.2  . Smokeless tobacco: Never Used  Substance and Sexual Activity  . Alcohol use: No  . Drug use: Never  .  Sexual activity: Not on file  Other Topics Concern  . Not on file  Social History Narrative  . Not on file   Social Determinants of Health   Financial Resource Strain: Low Risk   . Difficulty of Paying Living Expenses: Not hard at all  Food Insecurity: No Food Insecurity  .  Worried About Charity fundraiser in the Last Year: Never true  . Ran Out of Food in the Last Year: Never true  Transportation Needs:   . Lack of Transportation (Medical):   Marland Kitchen Lack of Transportation (Non-Medical):   Physical Activity:   . Days of Exercise per Week:   . Minutes of Exercise per Session:   Stress:   . Feeling of Stress :   Social Connections:   . Frequency of Communication with Friends and Family:   . Frequency of Social Gatherings with Friends and Family:   . Attends Religious Services:   . Active Member of Clubs or Organizations:   . Attends Archivist Meetings:   Marland Kitchen Marital Status:   Intimate Partner Violence:   . Fear of Current or Ex-Partner:   . Emotionally Abused:   Marland Kitchen Physically Abused:   . Sexually Abused:       PHYSICAL EXAM  There were no vitals filed for this visit. There is no height or weight on file to calculate BMI.  Generalized: Well developed, in no acute distress  Chest: Lungs clear to auscultation bilaterally  Neurological examination  Mentation: Alert oriented to time, place, history taking. Follows all commands speech and language fluent Cranial nerve II-XII: Extraocular movements were full, visual field were full on confrontational test Head turning and shoulder shrug  were normal and symmetric. Motor: The motor testing reveals 5 over 5 strength of all 4 extremities. Good symmetric motor tone is noted throughout.  Sensory: Sensory testing is intact to soft touch on all 4 extremities. No evidence of extinction is noted.  Gait and station: Gait is normal.    DIAGNOSTIC DATA (LABS, IMAGING, TESTING) - I reviewed patient records, labs, notes, testing  and imaging myself where available.  Lab Results  Component Value Date   WBC 8.9 01/08/2019   HGB 7.9 (L) 01/08/2019   HCT 23.9 (L) 01/08/2019   MCV 87.9 01/08/2019   PLT 195 01/08/2019      Component Value Date/Time   NA 139 06/03/2019 1630   K 3.6 06/03/2019 1630   CL 106 06/03/2019 1630   CO2 24 06/03/2019 1630   GLUCOSE 146 (H) 06/03/2019 1630   BUN 31 (H) 06/03/2019 1630   CREATININE 1.98 (H) 06/03/2019 1630   CREATININE 2.07 (H) 01/08/2019 1638   CALCIUM 8.9 06/03/2019 1630   PROT 5.7 (L) 01/01/2019 0434   ALBUMIN 3.2 (L) 01/01/2019 0434   AST 18 01/01/2019 0434   ALT 31 01/01/2019 0434   ALKPHOS 98 01/01/2019 0434   BILITOT 1.4 (H) 01/01/2019 0434   GFRNONAA 31 (L) 01/03/2019 0728   GFRAA 35 (L) 01/03/2019 0728   Lab Results  Component Value Date   CHOL 107 10/28/2018   HDL 35.10 (L) 10/28/2018   LDLCALC 60 10/28/2018   LDLDIRECT 66.0 01/26/2019   TRIG 58.0 10/28/2018   CHOLHDL 3 10/28/2018   Lab Results  Component Value Date   HGBA1C 6.8 (A) 07/13/2019   No results found for: [id number] Lab Results  Component Value Date   TSH 4.36 06/03/2019      ASSESSMENT AND PLAN 54 y.o. year old male  has a past medical history of Cataract (1994), Chronic kidney disease, Diabetes mellitus without complication ([place]), Eye problems, Heart murmur (1996), High cholesterol, Hypertension, Retinopathy due to secondary diabetes mellitus ([place]), Sleep apnea, and Stroke ([place]) (2016). here with:  1. OSA on BiPAP  - BiPAP compliance excellent -Residual AHI is elevated I will increase  pressure to 18/14 with backup rate of 4 - Encourage patient to use BiPAP nightly and > 4 hours each night - F/U in 6 months or sooner if needed   I spent 20 minutes of face-to-face and non-face-to-face time with patient.  This included previsit chart review, lab review, study review, order entry, electronic health record documentation, patient education.  Ward Givens, MSN, NP-C 08/05/2019,  8:46 AM [facility] [address], [place], [place] [address] [phone]

## 2019-08-05 NOTE — Progress Notes (Signed)
community message sent to [employer].

## 2019-08-05 NOTE — Patient Instructions (Signed)
Continue using BiPAP nightly and greater than 4 hours each night Increase pressure 18/14 If your symptoms worsen or you develop new symptoms please let us know.

## 2019-08-06 ENCOUNTER — Other Ambulatory Visit: Payer: Self-pay | Admitting: Family Medicine

## 2019-08-06 DIAGNOSIS — N179 Acute kidney failure, unspecified: Secondary | ICD-10-CM

## 2019-08-06 DIAGNOSIS — I10 Essential (primary) hypertension: Secondary | ICD-10-CM

## 2019-08-06 DIAGNOSIS — R42 Dizziness and giddiness: Secondary | ICD-10-CM

## 2019-08-06 NOTE — Telephone Encounter (Signed)
Last OV 06/03/19 Last fill for Meclizine 07/09/19  #30/0 Last fill for Clonidine 06/29/19  #60/0

## 2019-08-09 DIAGNOSIS — E109 Type 1 diabetes mellitus without complications: Secondary | ICD-10-CM | POA: Diagnosis not present

## 2019-08-10 ENCOUNTER — Encounter: Payer: Self-pay | Admitting: Adult Health

## 2019-08-12 ENCOUNTER — Other Ambulatory Visit: Payer: Self-pay

## 2019-08-12 DIAGNOSIS — E559 Vitamin D deficiency, unspecified: Secondary | ICD-10-CM

## 2019-08-12 DIAGNOSIS — E109 Type 1 diabetes mellitus without complications: Secondary | ICD-10-CM | POA: Diagnosis not present

## 2019-08-12 MED ORDER — VITAMIN D (ERGOCALCIFEROL) 1.25 MG (50000 UNIT) PO CAPS: 50000.0000 [IU] | ORAL_CAPSULE | ORAL | 0 refills | Status: DC

## 2019-08-19 DIAGNOSIS — G4737 Central sleep apnea in conditions classified elsewhere: Secondary | ICD-10-CM | POA: Diagnosis not present

## 2019-08-26 ENCOUNTER — Telehealth: Payer: Self-pay | Admitting: Family Medicine

## 2019-08-26 NOTE — Progress Notes (Signed)
  Chronic Care Management   Note  08/26/2019 Name: Adrian Neal MRN: [id number] DOB: 1966-04-19  Adrian Neal is a 54 y.o. year old male who is a primary care patient of [name], MD. I reached out to [name] by phone today in response to a referral sent by [name]'s PCP, [name], MD.   Adrian Neal was given information about Chronic Care Management services today including:  1. CCM service includes personalized support from designated clinical staff supervised by his physician, including individualized plan of care and coordination with other care providers 2. 24/7 contact phone numbers for assistance for urgent and routine care needs. 3. Service will only be billed when office clinical staff spend 20 minutes or more in a month to coordinate care. 4. Only one practitioner may furnish and bill the service in a calendar month. 5. The patient may stop CCM services at any time (effective at the end of the month) by phone call to the office staff.   Patient agreed to services and verbal consent obtained.   This note is not being shared with the patient for the following reason: To respect privacy (The patient or proxy has requested that the information not be shared).  Follow up plan:   Earney Hamburg Upstream Scheduler

## 2019-09-01 ENCOUNTER — Other Ambulatory Visit: Payer: Self-pay | Admitting: Family Medicine

## 2019-09-01 DIAGNOSIS — R42 Dizziness and giddiness: Secondary | ICD-10-CM

## 2019-09-01 DIAGNOSIS — E559 Vitamin D deficiency, unspecified: Secondary | ICD-10-CM

## 2019-09-06 ENCOUNTER — Ambulatory Visit (INDEPENDENT_AMBULATORY_CARE_PROVIDER_SITE_OTHER): Payer: PPO | Admitting: Family Medicine

## 2019-09-06 ENCOUNTER — Other Ambulatory Visit: Payer: Self-pay

## 2019-09-06 ENCOUNTER — Encounter: Payer: Self-pay | Admitting: Family Medicine

## 2019-09-06 VITALS — BP 134/66 | HR 77 | Temp 98.2°F | Ht 68.0 in | Wt 206.2 lb

## 2019-09-06 DIAGNOSIS — E782 Mixed hyperlipidemia: Secondary | ICD-10-CM

## 2019-09-06 DIAGNOSIS — N1832 Chronic kidney disease, stage 3b: Secondary | ICD-10-CM

## 2019-09-06 DIAGNOSIS — S72001S Fracture of unspecified part of neck of right femur, sequela: Secondary | ICD-10-CM | POA: Diagnosis not present

## 2019-09-06 DIAGNOSIS — I10 Essential (primary) hypertension: Secondary | ICD-10-CM | POA: Diagnosis not present

## 2019-09-06 MED ORDER — CARVEDILOL 12.5 MG PO TABS: 12.5000 mg | ORAL_TABLET | Freq: Two times a day (BID) | ORAL | 3 refills | Status: DC

## 2019-09-06 NOTE — Progress Notes (Signed)
Established Patient Office Visit  Subjective:  Patient ID: Adrian Neal, male    DOB: 1965-08-11  Age: 54 y.o. MRN: [id number]  CC:  Chief Complaint  Patient presents with  . Follow-up    follow up, patient would like to know what he can do to bring his heart rate up also would like to know what exercises he can do to help.     HPI Adrian Neal presents for follow-up of his hypertension, LDL cholesterol chronic kidney disease history of hip fracture status post falling from standing.  Patient has not gone for his DEXA scan yet.  We will reorder it for him.  Continues atorvastatin for his cholesterol.  Blood pressure has been well controlled with lisinopril 20, Norvasc 10, clonidine 0.1 twice daily and carvedilol 25 twice daily.  Patient has been trying to lose some weight and has had some difficulty achieving his target heart rate.  He has no history of atrial fib.  He rarely experiences heart flutter but it has not been accompanied by chest pain shortness of breath or diaphoresis.  LDL has been well controlled.  Past Medical History:  Diagnosis Date  . Cataract 1994   right eye  . Chronic kidney disease   . Diabetes mellitus without complication ([place])    diagnosed at age 52  . Eye problems   . Heart murmur 1996  . High cholesterol    patient denies but take preventative medicine  . Hypertension   . 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 Sleep apnea    on BiPAP  . Stroke [facility]) 2016    Past Surgical History:  Procedure Laterality Date  . ANTERIOR APPROACH HEMI HIP ARTHROPLASTY Left 01/01/2019   Procedure: ANTERIOR APPROACH total hip ARTHROPLASTY;  Surgeon: Rod Can, MD;  Location: [place];  Service: Orthopedics;  Laterality: Left;  . CATARACT EXTRACTION Right 1994  . CATARACT EXTRACTION W/ INTRAOCULAR LENS IMPLANT  1994  . EYE SURGERY Right 1991   vitrectomy  . EYE SURGERY Right 1992   scar tissue removed from retina    Family  History  Problem Relation Age of Onset  . Hypertension Mother   . Hyperlipidemia Mother   . Hyperlipidemia Father   . Hypertension Father   . Diabetes Father   . Kidney disease Father        had a kidney transplant   . Stroke Brother   . Diabetes Brother   . Pulmonary fibrosis Paternal Grandmother   . Lung cancer Paternal Grandfather   . Diabetes Daughter   . Colon cancer Neg Hx   . Esophageal cancer Neg Hx   . Rectal cancer Neg Hx   . Stomach cancer Neg Hx     Social History   Socioeconomic History  . Marital status: Married    Spouse name: Not on file  . Number of children: 3  . Years of education: Not on file  . Highest education level: Not on file  Occupational History  . Occupation: Disabled  Tobacco Use  . Smoking status: Former Smoker    Packs/day: 1.00    Years: 6.00    Pack years: 6.00    Quit date: 04/30/1991    Years since quitting: 28.3  . Smokeless tobacco: Never Used  Substance and Sexual Activity  . Alcohol use: No  . Drug use: Never  . Sexual activity: Not on file  Other Topics Concern  . Not on file  Social History Narrative  . Not on file   Social Determinants of Health   Financial Resource Strain: Low Risk   . Difficulty of Paying Living Expenses: Not hard at all  Food Insecurity: No Food Insecurity  . Worried About Charity fundraiser in the Last Year: Never true  . Ran Out of Food in the Last Year: Never true  Transportation Needs:   . Lack of Transportation (Medical):   Marland Kitchen Lack of Transportation (Non-Medical):   Physical Activity:   . Days of Exercise per Week:   . Minutes of Exercise per Session:   Stress:   . Feeling of Stress :   Social Connections:   . Frequency of Communication with Friends and Family:   . Frequency of Social Gatherings with Friends and Family:   . Attends Religious Services:   . Active Member of Clubs or Organizations:   . Attends Archivist Meetings:   Marland Kitchen Marital Status:   Intimate Partner  Violence:   . Fear of Current or Ex-Partner:   . Emotionally Abused:   Marland Kitchen Physically Abused:   . Sexually Abused:     Outpatient Medications Prior to Visit  Medication Sig Dispense Refill  . amLODipine (NORVASC) 10 MG tablet Take 1 tablet by mouth once daily (Patient taking differently: 5 mg. ) 90 tablet 0  . aspirin EC 81 MG tablet Take 1 tablet (81 mg total) by mouth daily. 365 tablet 1  . atorvastatin (LIPITOR) 40 MG tablet TAKE 1 TABLET BY MOUTH ONCE DAILY AT  6  PM  -  PLEASE  KEEP  UPCOMING  APPT 90 tablet 0  . cloNIDine (CATAPRES) 0.1 MG tablet Take 1 tablet by mouth twice daily 180 tablet 1  . clopidogrel (PLAVIX) 75 MG tablet Take 1 tablet (75 mg total) by mouth daily. Aspirin and Plavix for 3 months, after that-stop Aspirin-take Plavix alone 90 tablet 1  . Continuous Blood Gluc Receiver (DEXCOM G6 RECEIVER) DEVI 1 Device by Does not apply route as directed. 1 Device 0  . Continuous Blood Gluc Sensor (DEXCOM G6 SENSOR) MISC 3 Devices by Does not apply route as directed. 3 each 6  . Continuous Blood Gluc Transmit (DEXCOM G6 TRANSMITTER) MISC 1 Device by Does not apply route as directed. 1 each 6  . fluticasone (FLONASE) 50 MCG/ACT nasal spray Place 2 sprays into both nostrils daily. 16 g 6  . furosemide (LASIX) 20 MG tablet Take 1 tablet (20 mg total) by mouth daily. 90 tablet 3  . Insulin Infusion Pump (T:SLIM INSULIN PUMP) DEVI by Does not apply route.    . insulin lispro (HUMALOG) 100 UNIT/ML injection Max daily dose of 100 units via pump DX E10.59 (vials) 90 mL 4  . lisinopril (ZESTRIL) 20 MG tablet Take 1 tablet (20 mg total) by mouth daily. 90 tablet 0  . meclizine (ANTIVERT) 25 MG tablet TAKE 1 TABLET BY MOUTH THREE TIMES DAILY AS NEEDED FOR DIZZINESS 30 tablet 0  . pantoprazole (PROTONIX) 40 MG tablet Take 1 tablet (40 mg total) by mouth daily. 90 tablet 1  . tamsulosin (FLOMAX) 0.4 MG CAPS capsule TAKE 1 CAPSULE BY MOUTH ONCE DAILY AFTER  BREAKFAST 90 capsule 0  . Vitamin D,  Ergocalciferol, (DRISDOL) 1.25 MG (50000 UNIT) CAPS capsule Take 1 capsule by mouth once a week 5 capsule 2  . carvedilol (COREG) 25 MG tablet Take 1 tablet (25 mg total) by mouth 2 (two) times daily with a meal. 60 tablet 2  No facility-administered medications prior to visit.    No Known Allergies  ROS Review of Systems  Constitutional: Negative.   HENT: Negative.   Eyes: Negative for photophobia and visual disturbance.  Respiratory: Negative.   Cardiovascular: Negative.   Gastrointestinal: Negative.   Genitourinary: Negative.   Musculoskeletal: Negative for gait problem and joint swelling.      Objective:    Physical Exam  Constitutional: He is oriented to person, place, and time. He appears well-developed and well-nourished. No distress.  HENT:  Head: Normocephalic and atraumatic.  Right Ear: External ear normal.  Left Ear: External ear normal.  Eyes: Conjunctivae are normal. Right eye exhibits no discharge. Left eye exhibits no discharge. No scleral icterus.  Neck: No JVD present. No tracheal deviation present.  Cardiovascular: Normal rate and regular rhythm.  Murmur heard. Pulmonary/Chest: Effort normal and breath sounds normal. No stridor.  Neurological: He is alert and oriented to person, place, and time.  Skin: Skin is warm and dry. He is not diaphoretic.  Psychiatric: He has a normal mood and affect. His behavior is normal.    BP 134/66   Pulse 77   Temp 98.2 F (36.8 C) (Tympanic)   Ht 5\' 8"  (1.727 m)   Wt 206 lb 3.2 oz (93.5 kg)   SpO2 96%   BMI 31.35 kg/m  Wt Readings from Last 3 Encounters:  09/06/19 206 lb 3.2 oz (93.5 kg)  08/05/19 200 lb (90.7 kg)  07/13/19 206 lb 12.8 oz (93.8 kg)     Health Maintenance Due  Topic Date Due  . COVID-19 Vaccine (1) Never done    There are no preventive care reminders to display for this patient.  Lab Results  Component Value Date   TSH 4.36 06/03/2019   Lab Results  Component Value Date   WBC 8.9  01/08/2019   HGB 7.9 (L) 01/08/2019   HCT 23.9 (L) 01/08/2019   MCV 87.9 01/08/2019   PLT 195 01/08/2019   Lab Results  Component Value Date   NA 139 06/03/2019   K 3.6 06/03/2019   CO2 24 06/03/2019   GLUCOSE 146 (H) 06/03/2019   BUN 31 (H) 06/03/2019   CREATININE 1.98 (H) 06/03/2019   BILITOT 1.4 (H) 01/01/2019   ALKPHOS 98 01/01/2019   AST 18 01/01/2019   ALT 31 01/01/2019   PROT 5.7 (L) 01/01/2019   ALBUMIN 3.2 (L) 01/01/2019   CALCIUM 8.9 06/03/2019   ANIONGAP 9 01/03/2019   GFR 35.46 (L) 06/03/2019   Lab Results  Component Value Date   CHOL 107 10/28/2018   Lab Results  Component Value Date   HDL 35.10 (L) 10/28/2018   Lab Results  Component Value Date   LDLCALC 60 10/28/2018   Lab Results  Component Value Date   TRIG 58.0 10/28/2018   Lab Results  Component Value Date   CHOLHDL 3 10/28/2018   Lab Results  Component Value Date   HGBA1C 6.8 (A) 07/13/2019      Assessment & Plan:   Problem List Items Addressed This Visit      Cardiovascular and Mediastinum   Essential hypertension - Primary   Relevant Medications   carvedilol (COREG) 12.5 MG tablet   Other Relevant Orders   Basic metabolic panel     Genitourinary   Stage 3b chronic kidney disease   Relevant Orders   Basic metabolic panel     Other   Mixed hyperlipidemia   Relevant Medications   carvedilol (COREG) 12.5 MG tablet  Other Relevant Orders   LDL cholesterol, direct    Other Visit Diagnoses    Closed fracture of right hip, sequela       Relevant Orders   DG Bone Density      Meds ordered this encounter  Medications  . carvedilol (COREG) 12.5 MG tablet    Sig: Take 1 tablet (12.5 mg total) by mouth 2 (two) times daily with a meal.    Dispense:  60 tablet    Refill:  3    Follow-up: Return in about 1 month (around 10/07/2019).   We will try reduced dose of the Coreg.  Patient will check and record his blood pressures and follow-up in 1 month.  Discussed that if he  was to discontinue the Coreg it would need to be tapered we may need to adjust one of his other medications upward.  In discussion it sounds as though he is not taking it for some clear reason. [name], MD

## 2019-09-07 LAB — BASIC METABOLIC PANEL
BUN: 34 mg/dL — ABNORMAL HIGH (ref 6–23)
CO2: 24 mEq/L (ref 19–32)
Calcium: 8.8 mg/dL (ref 8.4–10.5)
Chloride: 105 mEq/L (ref 96–112)
Creatinine, Ser: 1.91 mg/dL — ABNORMAL HIGH (ref 0.40–1.50)
GFR: 36.93 mL/min — ABNORMAL LOW (ref >60.00)
Glucose, Bld: 255 mg/dL — ABNORMAL HIGH (ref 70–99)
Potassium: 4.1 mEq/L (ref 3.5–5.1)
Sodium: 139 mEq/L (ref 135–145)

## 2019-09-07 LAB — LDL CHOLESTEROL, DIRECT: Direct LDL: 73 mg/dL

## 2019-09-08 DIAGNOSIS — E109 Type 1 diabetes mellitus without complications: Secondary | ICD-10-CM | POA: Diagnosis not present

## 2019-09-10 NOTE — Addendum Note (Signed)
Addended by: Lynda Rainwater on: 09/10/2019 02:14 PM   Modules accepted: Orders

## 2019-09-11 DIAGNOSIS — E109 Type 1 diabetes mellitus without complications: Secondary | ICD-10-CM | POA: Diagnosis not present

## 2019-09-13 ENCOUNTER — Encounter: Payer: Self-pay | Admitting: Internal Medicine

## 2019-09-13 NOTE — Progress Notes (Signed)
NEUROLOGY CONSULTATION NOTE  TONNY ISENSEE MRN: [id number] DOB: 26-Nov-1965  Referring provider: [name], MD Primary care provider: [name], MD  Reason for consult:  vertigo  HISTORY OF PRESENT ILLNESS: Adrian Neal is a 54 year old right-handed male with HTN, diabetes mellitus, CKD, retinopathy, sleep apnea and history of stroke who presents for vertigo.  History supplemented by hospital records and referring provider's note.  He had a punctate left cerebellar stroke secondary to small vessel disease in March 2016 presenting with dizziness, nausea, vomiting and left upper extremity ataxia.  He later had another stroke in September 2016, a small pontine infarct secondary to small vessel disease, presenting with right sided numbness and ataxia.  CTA demonstrated intracranial atherosclerosis with stenosis.  Since the strokes, he continues to have balance and coordination issues.  He had a fall on his steep driveway and tripped over a cord last year and fractured his hip. He continues to have dizziness.  It is not a spinning sensation but he feels a little groggy in the head and is unsteady on his feet.  .  It is intermittent which occurs spontaneously but not positional.  It usually occurs when he is out and on his feet for prolonged period of time.  It typically lasts an hour or so.  If he goes out to the store, he may need to use an electric cart.  It occurs at least once almost daily.  No double vision, nausea, vomiting, slurred speech, dysphagia.  Since his second stroke, he has some residual right sided numbness.  He uses Flonase daily.  He takes meclizine every morning.  If he misses a dose, the disequilibrium occurs more often but not necessarily worse.    Imaging (personally reviewed): 01/19/2015 MRI BRAIN WO:  1. Acute 5 mm left pontine infarct.  2. Chronic left middle cerebellar peduncle infarct. 01/20/2015 CTA HEAD & NECK:  1. Mild but somewhat age  advanced carotid atherosclerosis, greater on the right in the form of right CCA soft plaque which is not hemodynamically significant.  2. 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  3. No major circle of Willis branch occlusion and no proximal posterior circulation stenosis.  4. Stable and negative CT appearance of the brain; brainstem infarct remains occult by CT.  Labs: 06/03/2019 TSH 4.34; VIT D 39.41 07/13/2019 HGB A1c 6.8 09/06/2019 direct LDL 73; BMP with Na 139, K 4.1, Cl 105, CO2 24, glucose 255, BUN 34, Cr 1.91, GFR 36.93  PAST MEDICAL HISTORY: Past Medical History:  Diagnosis Date  . Cataract 1994   right eye  . Chronic kidney disease   . Diabetes mellitus without complication ([place])    diagnosed at age 65  . Eye problems   . Heart murmur 1996  . High cholesterol    patient denies but take preventative medicine  . Hypertension   . 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 Sleep apnea    on BiPAP  . Stroke [facility]) 2016    PAST SURGICAL HISTORY: Past Surgical History:  Procedure Laterality Date  . ANTERIOR APPROACH HEMI HIP ARTHROPLASTY Left 01/01/2019   Procedure: ANTERIOR APPROACH total hip ARTHROPLASTY;  Surgeon: Rod Can,  MD;  Location: [place];  Service: Orthopedics;  Laterality: Left;  . CATARACT EXTRACTION Right 1994  . CATARACT EXTRACTION W/ INTRAOCULAR LENS IMPLANT  1994  . EYE SURGERY Right 1991   vitrectomy  . EYE SURGERY Right 1992   scar tissue removed from retina    MEDICATIONS: Current Outpatient Medications on File Prior to Visit  Medication Sig Dispense Refill  . amLODipine (NORVASC) 10 MG tablet Take 1 tablet by mouth once daily (Patient taking  differently: 5 mg. ) 90 tablet 0  . aspirin EC 81 MG tablet Take 1 tablet (81 mg total) by mouth daily. 365 tablet 1  . atorvastatin (LIPITOR) 40 MG tablet TAKE 1 TABLET BY MOUTH ONCE DAILY AT  6  PM  -  PLEASE  KEEP  UPCOMING  APPT 90 tablet 0  . carvedilol (COREG) 12.5 MG tablet Take 1 tablet (12.5 mg total) by mouth 2 (two) times daily with a meal. 60 tablet 3  . cloNIDine (CATAPRES) 0.1 MG tablet Take 1 tablet by mouth twice daily 180 tablet 1  . clopidogrel (PLAVIX) 75 MG tablet Take 1 tablet (75 mg total) by mouth daily. Aspirin and Plavix for 3 months, after that-stop Aspirin-take Plavix alone 90 tablet 1  . Continuous Blood Gluc Receiver (DEXCOM G6 RECEIVER) DEVI 1 Device by Does not apply route as directed. 1 Device 0  . Continuous Blood Gluc Sensor (DEXCOM G6 SENSOR) MISC 3 Devices by Does not apply route as directed. 3 each 6  . Continuous Blood Gluc Transmit (DEXCOM G6 TRANSMITTER) MISC 1 Device by Does not apply route as directed. 1 each 6  . fluticasone (FLONASE) 50 MCG/ACT nasal spray Place 2 sprays into both nostrils daily. 16 g 6  . furosemide (LASIX) 20 MG tablet Take 1 tablet (20 mg total) by mouth daily. 90 tablet 3  . Insulin Infusion Pump (T:SLIM INSULIN PUMP) DEVI by Does not apply route.    . insulin lispro (HUMALOG) 100 UNIT/ML injection Max daily dose of 100 units via pump DX E10.59 (vials) 90 mL 4  . lisinopril (ZESTRIL) 20 MG tablet Take 1 tablet (20 mg total) by mouth daily. 90 tablet 0  . meclizine (ANTIVERT) 25 MG tablet TAKE 1 TABLET BY MOUTH THREE TIMES DAILY AS NEEDED FOR DIZZINESS 30 tablet 0  . pantoprazole (PROTONIX) 40 MG tablet Take 1 tablet (40 mg total) by mouth daily. 90 tablet 1  . tamsulosin (FLOMAX) 0.4 MG CAPS capsule TAKE 1 CAPSULE BY MOUTH ONCE DAILY AFTER  BREAKFAST 90 capsule 0  . Vitamin D, Ergocalciferol, (DRISDOL) 1.25 MG (50000 UNIT) CAPS capsule Take 1 capsule by mouth once a week 5 capsule 2   No current facility-administered medications on  file prior to visit.    ALLERGIES: No Known Allergies  FAMILY HISTORY: Family History  Problem Relation Age of Onset  . Hypertension Mother   . Hyperlipidemia Mother   . Hyperlipidemia Father   . Hypertension Father   . Diabetes Father   . Kidney disease Father        had a kidney transplant   . Stroke Brother   . Diabetes Brother   . Pulmonary fibrosis Paternal Grandmother   . Lung cancer Paternal Grandfather   . Diabetes Daughter   . Colon cancer Neg Hx   . Esophageal cancer Neg Hx   . Rectal cancer Neg Hx   . Stomach cancer Neg Hx    SOCIAL HISTORY: Social History   Socioeconomic History  . Marital  status: Married    Spouse name: Not on file  . Number of children: 3  . Years of education: Not on file  . Highest education level: Not on file  Occupational History  . Occupation: Disabled  Tobacco Use  . Smoking status: Former Smoker    Packs/day: 1.00    Years: 6.00    Pack years: 6.00    Quit date: 04/30/1991    Years since quitting: 28.3  . Smokeless tobacco: Never Used  Substance and Sexual Activity  . Alcohol use: No  . Drug use: Never  . Sexual activity: Not on file  Other Topics Concern  . Not on file  Social History Narrative  . Not on file   Social Determinants of Health   Financial Resource Strain: Low Risk   . Difficulty of Paying Living Expenses: Not hard at all  Food Insecurity: No Food Insecurity  . Worried About Charity fundraiser in the Last Year: Never true  . Ran Out of Food in the Last Year: Never true  Transportation Needs:   . Lack of Transportation (Medical):   Marland Kitchen Lack of Transportation (Non-Medical):   Physical Activity:   . Days of Exercise per Week:   . Minutes of Exercise per Session:   Stress:   . Feeling of Stress :   Social Connections:   . Frequency of Communication with Friends and Family:   . Frequency of Social Gatherings with Friends and Family:   . Attends Religious Services:   . Active Member of Clubs or  Organizations:   . Attends Archivist Meetings:   Marland Kitchen Marital Status:   Intimate Partner Violence:   . Fear of Current or Ex-Partner:   . Emotionally Abused:   Marland Kitchen Physically Abused:   . Sexually Abused:     PHYSICAL EXAM: Blood pressure (!) 168/75, pulse 80, height 5\' 8"  (1.727 m), weight 204 lb 12.8 oz (92.9 kg), SpO2 99 %. General: No acute distress.  Patient appears well-groomed.   Head:  Normocephalic/atraumatic Eyes:  fundi examined but not visualized Neck: supple, no paraspinal tenderness, full range of motion Back: No paraspinal tenderness Heart: regular rate and rhythm Lungs: Clear to auscultation bilaterally. Vascular: No carotid bruits. Neurological Exam: Mental status: alert and oriented to person, place, and time, recent and remote memory intact, fund of knowledge intact, attention and concentration intact, speech fluent and not dysarthric, language intact. Cranial nerves: CN I: not tested CN II: pupils equal, round and reactive to light, vision loss in right eye CN III, IV, VI:  full range of motion, no nystagmus, squints CN V: facial sensation intact CN VII: upper and lower face symmetric CN VIII: hearing intact CN IX, X: gag intact, uvula midline CN XI: sternocleidomastoid and trapezius muscles intact CN XII: tongue midline Bulk & Tone: normal, no fasciculations. Motor:  5/5 throughout Sensation:  Pinprick and vibration sensation intact.   Deep Tendon Reflexes:  2+ throughout,  toes downgoing.   Finger to nose testing:  Without dysmetria.  Heel to shin:  Without dysmetria.   Gait:  Ataxic/right hemiparetic.  Able to turn.  Unable to tandem walk.  Romberg with sway.  IMPRESSION: 1.  Ataxia/disequilibrium, residual since stroke.  Balance and not dizziness is his main symptom.  No specific treatment. 2.  History of CVA 3.  Type 1 diabetes mellitus 4.  Hyperlipidemia 5.  Hypertension 6.  OSA   PLAN: 1.  Advised to discontinue meclizine 2.  Continue  to use  gait assisted devices (cane) 3.  Secondary stroke prevention as managed by PCP:  Plavix, statin (LDL goal less than 70), glycemic control (Hgb A1c goal less than 7), blood pressure control 4.  Follow up with sleep medicine  5.  Follow up as needed.  Thank you for allowing me to take part in the care of this patient.  Metta Clines, DO  CC: [name], MD

## 2019-09-14 ENCOUNTER — Other Ambulatory Visit: Payer: Self-pay

## 2019-09-14 ENCOUNTER — Telehealth: Payer: PPO

## 2019-09-14 ENCOUNTER — Ambulatory Visit (INDEPENDENT_AMBULATORY_CARE_PROVIDER_SITE_OTHER): Payer: PPO | Admitting: Neurology

## 2019-09-14 ENCOUNTER — Encounter: Payer: Self-pay | Admitting: Neurology

## 2019-09-14 VITALS — BP 168/75 | HR 80 | Ht 68.0 in | Wt 204.8 lb

## 2019-09-14 DIAGNOSIS — E785 Hyperlipidemia, unspecified: Secondary | ICD-10-CM | POA: Diagnosis not present

## 2019-09-14 DIAGNOSIS — H832X9 Labyrinthine dysfunction, unspecified ear: Secondary | ICD-10-CM | POA: Diagnosis not present

## 2019-09-14 DIAGNOSIS — N1831 Chronic kidney disease, stage 3a: Secondary | ICD-10-CM | POA: Diagnosis not present

## 2019-09-14 DIAGNOSIS — I10 Essential (primary) hypertension: Secondary | ICD-10-CM | POA: Diagnosis not present

## 2019-09-14 DIAGNOSIS — E1021 Type 1 diabetes mellitus with diabetic nephropathy: Secondary | ICD-10-CM

## 2019-09-14 DIAGNOSIS — I639 Cerebral infarction, unspecified: Secondary | ICD-10-CM | POA: Diagnosis not present

## 2019-09-14 NOTE — Chronic Care Management (AMB) (Signed)
Chronic Care Management Pharmacy  Name: KONG PACKETT  MRN: [id number] DOB: 05/05/1965  Chief Complaint/ HPI  Adrian Neal,  54 y.o. , male presents for their Initial CCM visit with the clinical pharmacist via telephone due to COVID-19 Pandemic.  PCP : [name], MD  Their chronic conditions include: Hypertension, T1DM, CVA, Chronic kidney disease, hyperlipidemia,   Office Visits: 09/06/19: Patient presented to Dr. Ethelene Hal for HTN follow-up. BP in clinic 134/66. Labwork stable. Patient possibly not taking carvedilol, concerned about low heart rate. Carvediolol decreased to 12.5 mg BID.   06/03/19: Patient presented to Dr. Ethelene Hal for follow-up. BP in clinic 142/70. Still with edema in legs. Furosemide increased to 20 mg daily. Vit D improved, to continue on ergocalciferol until next visit.   Consult Visit: 09/14/19: Patient referred to Dr. Tomi Likens (neurology) for vertigo. Advised to discontinue meclizine  08/05/19: Patient presented to Ward Givens, NP, for OSA follow-up. CPAP compliance 100%. Voltaren 2%, Norco discontinued (patient no longer taking) 07/14/19: Patient presented to Dr. Coralyn Pear (ophthalmology) for diabetic eye exam.  07/13/19: Patient presented to Dr. Kelton Pillar (endocrinology) for T1DM follow-up. A1c 6.8%. I:C ratio increased from 0600-1100, sensitivity increased to 30.   Medications: Outpatient Encounter Medications as of 09/15/2019  Medication Sig  . amLODipine (NORVASC) 10 MG tablet Take 1 tablet by mouth once daily (Patient taking differently: 5 mg. )  . aspirin EC 81 MG tablet Take 1 tablet (81 mg total) by mouth daily.  Marland Kitchen atorvastatin (LIPITOR) 40 MG tablet TAKE 1 TABLET BY MOUTH ONCE DAILY AT  6  PM  -  PLEASE  KEEP  UPCOMING  APPT  . carvedilol (COREG) 12.5 MG tablet Take 1 tablet (12.5 mg total) by mouth 2 (two) times daily with a meal.  . cloNIDine (CATAPRES) 0.1 MG tablet Take 1 tablet by mouth twice daily  . clopidogrel (PLAVIX) 75 MG tablet  Take 1 tablet (75 mg total) by mouth daily. Aspirin and Plavix for 3 months, after that-stop Aspirin-take Plavix alone  . fluticasone (FLONASE) 50 MCG/ACT nasal spray Place 2 sprays into both nostrils daily.  . furosemide (LASIX) 20 MG tablet Take 1 tablet (20 mg total) by mouth daily.  . insulin lispro (HUMALOG) 100 UNIT/ML injection Max daily dose of 100 units via pump DX E10.59 (vials)  . lisinopril (ZESTRIL) 20 MG tablet Take 1 tablet (20 mg total) by mouth daily.  . pantoprazole (PROTONIX) 40 MG tablet Take 1 tablet (40 mg total) by mouth daily.  . tamsulosin (FLOMAX) 0.4 MG CAPS capsule TAKE 1 CAPSULE BY MOUTH ONCE DAILY AFTER  BREAKFAST  . Vitamin D, Ergocalciferol, (DRISDOL) 1.25 MG (50000 UNIT) CAPS capsule Take 1 capsule by mouth once a week  . Continuous Blood Gluc Receiver (DEXCOM G6 RECEIVER) DEVI 1 Device by Does not apply route as directed.  . Continuous Blood Gluc Sensor (DEXCOM G6 SENSOR) MISC 3 Devices by Does not apply route as directed.  . Continuous Blood Gluc Transmit (DEXCOM G6 TRANSMITTER) MISC 1 Device by Does not apply route as directed.  . Insulin Infusion Pump (T:SLIM INSULIN PUMP) DEVI by Does not apply route.  . meclizine (ANTIVERT) 25 MG tablet TAKE 1 TABLET BY MOUTH THREE TIMES DAILY AS NEEDED FOR DIZZINESS (Patient not taking: Reported on 09/15/2019)   No facility-administered encounter medications on file as of 09/15/2019.     Current Diagnosis/Assessment:  Goals Addressed            This Visit's Progress   . Chronic  Care Management       CARE PLAN ENTRY  Current Barriers:  . Chronic Disease Management support, education, and care coordination needs related to Hypertension, Hyperlipidemia, Diabetes, and Chronic Kidney Disease   Hypertension . Pharmacist Clinical Goal(s): o Over the next 90 days, patient will work with PharmD and providers to achieve BP goal <140/90 . Current regimen:  o Amlodipine 5 mg  o Carvedilol 12.5 mg  o Clonidine 0.1 mg BID   o Furosemide 20 mg  o Lisinopril 20 mg  . Interventions: o Recommend taking amlodipine at night to minimize leg swelling . Patient self care activities - Over the next 90 days, patient will: o Check blood pressure at least twice weekly, document, and provide at future appointments o Ensure daily salt intake < 2300 mg/day o Slowly increase exercise tolerance with a goal of 150 minutes of moderate intensity exercise weekly  Hyperlipidemia . Pharmacist Clinical Goal(s): o Over the next 90 days, patient will work with PharmD and providers to achieve LDL goal < 70 . Current regimen:  o Atorvastatin 40 mg   Diabetes . Pharmacist Clinical Goal(s): o Over the next 90 days, patient will work with PharmD and providers to maintain A1c goal <7% . Current regimen:  o Humalog . Patient self care activities - Over the next 90 days, patient will: o Continue using Dexcom to check blood sugars and provide at future appointments o Contact provider with any episodes of hypoglycemia  Medication management . Pharmacist Clinical Goal(s): o Over the next 90 days, patient will work with PharmD and providers to maintain optimal medication adherence . Current pharmacy: [facility] . Interventions o Comprehensive medication review performed. o Continue current medication management strategy . Patient self care activities - Over the next 90 days, patient will: o Take medications as prescribed o Report any questions or concerns to PharmD and/or provider(s)       Diabetes   Recent Relevant Labs: Lab Results  Component Value Date/Time   HGBA1C 6.8 (A) 07/13/2019 03:48 PM   HGBA1C 6.8 (A) 04/13/2019 03:41 PM   HGBA1C 7.2 (H) 01/01/2019 04:34 AM   HGBA1C 7.7 (H) 01/20/2015 05:17 AM   MICROALBUR 28.4 (H) 10/28/2018 08:39 AM   MICROALBUR 71.1 (H) 06/15/2018 08:51 AM     Checking BG: Daily. Patient has a Dexcom G6 CGM.    Target Date(s)    Number of days worn ? 14 days 5/5-5/19    % of time active  ? 70%     Mean Glucose (mg/dL)  160    GMI  = 3.31 + 0.02392 x [mean glucose in mg/dL]  7.1%    Glycemic Variability (%CV) ?36% n/a    Time above >250 mg/dL <5% 5%    Time above 70-180 mg/dL <25% 24%    Time in range: 70-180 mg/dL >70% 70%     Time below 70 mg/dL <4% <1%    Time below 54 mg/dL <1% <0%     Patient has failed these meds in past: n/a Patient is currently controlled on the following medications:   Insulin Lispro via infusion pump  Last diabetic Foot exam: No results found for: HMDIABEYEEXA  Last diabetic Eye exam: 07/14/19    We discussed: diet and exercise extensively. Patient actively carb counting and watching portion sizes. He states on instance of concern where he accidentally input too large of an insulin dose into his pump and had to correct with pop-tarts. His blood sugar was 74 at that time.  Plan  Continue current medications  Hypertension   BP today is: >140/90  Office blood pressures are  BP Readings from Last 3 Encounters:  09/14/19 (!) 168/75  09/06/19 134/66  08/05/19 (!) 149/71   CMP Latest Ref Rng & Units 09/06/2019 06/03/2019 03/02/2019  Glucose 70 - 99 mg/dL 255(H) 146(H) 141(H)  BUN 6 - 23 mg/dL 34(H) 31(H) 32(H)  Creatinine 0.40 - 1.50 mg/dL 1.91(H) 1.98(H) 2.00(H)  Sodium 135 - 145 mEq/L 139 139 140  Potassium 3.5 - 5.1 mEq/L 4.1 3.6 3.8  Chloride 96 - 112 mEq/L 105 106 104  CO2 19 - 32 mEq/L 24 24 28   Calcium 8.4 - 10.5 mg/dL 8.8 8.9 9.2  Total Protein 6.5 - 8.1 g/dL - - -  Total Bilirubin 0.3 - 1.2 mg/dL - - -  Alkaline Phos 38 - 126 U/L - - -  AST 15 - 41 U/L - - -  ALT 0 - 44 U/L - - -   Patient has failed these meds in the past: hydralazine 50 mg, Toprol XL, ramipril 10 mg  Patient is currently uncontrolled on the following medications:   Amlodipine 5 mg daily (feet swelling with 10 mg)  Carvedilol 12.5 mg BID   Clonidine 0.1 mg BID   Furosemide 20 mg daily   Lisinopril 20 mg daily  Patient checks BP at home 1-2x per  week  Patient home BP readings are ranging: Systolics typically range between148-158, reports one instance of systolic [id number], Diastolics ranging 95-62   We discussed diet and exercise extensively Does not salt food. TV dinners, sandwiches, hot dogs, french fries, potato chips.   Not walking recently. Some muscular discomfort, but mostly states he has been inactive due to laziness/COVID-19.   Plan  Recommend switching amlodipine to nightly administration to help lessen edema build-up.  Recommend minimizing salt intake  Recommend slowly increasing activity to 150 minutes weekly.   Continue current medications   Hyperlipidemia   History of stroke Mar 2016, Sep 2016  Lipid Panel     Component Value Date/Time   CHOL 107 10/28/2018 0839   TRIG 58.0 10/28/2018 0839   HDL 35.10 (L) 10/28/2018 0839   CHOLHDL 3 10/28/2018 0839   VLDL 11.6 10/28/2018 0839   LDLCALC 60 10/28/2018 0839   LDLDIRECT 73.0 09/06/2019 1658     The ASCVD Risk score (Goff DC Jr., et al., 2013) failed to calculate for the following reasons:   The patient has a prior MI or stroke diagnosis   Patient has failed these meds in past: n/a Patient is currently uncontrolled on the following medications:   Atorvastatin 40 mg daily   Aspirin 81 mg daily  Clopidogrel 75 mg daily  We discussed:  diet and exercise extensively  Plan  Continue current medications   Misc/OTC   Flonase 50 mcg/act nasal spray (balance) - ineffective Meclizine 25 mg TID PRN dizziness - stopped  Pantoprazole 40 mg daily - well controlled  Tamsulosin 0.4 mg daily - started prior to surgery, denies BPH or urinary retention.  Ergocalciferol 50,000 units weekly (Saturdays)  Plan  Recommend stopping ergocalciferol Recommend starting Vitamin D 1000 units daily  Recommend stopping tamsulosin due to lack of clear indication   Vaccines   Reviewed and discussed patient's vaccination history.    Immunization History  Administered  Date(s) Administered  . Influenza,inj,Quad PF,6+ Mos 07/15/2018, 01/08/2019  . Pneumococcal Polysaccharide-23 03/02/2019  . Tdap 04/29/2017    Plan  Recommended patient receive Covid-19 vaccine  Medication Management  Pt uses [facility] for all medications Uses pill box? Yes Pt endorses 100% compliance  Plan  Continue current medication management strategy  Follow up:  1 month CMA assessment on medication changes 3 month phone visit  [place] at [facility]  [phone]

## 2019-09-14 NOTE — Patient Instructions (Signed)
1.  I would discontinue meclizine 2.  Continue to use your cane/walker

## 2019-09-15 ENCOUNTER — Ambulatory Visit: Payer: PPO

## 2019-09-15 ENCOUNTER — Telehealth: Payer: Self-pay

## 2019-09-15 DIAGNOSIS — N1831 Chronic kidney disease, stage 3a: Secondary | ICD-10-CM

## 2019-09-15 DIAGNOSIS — I10 Essential (primary) hypertension: Secondary | ICD-10-CM

## 2019-09-15 NOTE — Telephone Encounter (Signed)
Approval from [place] for infusion set and cartridges received. Authorization # [id number]

## 2019-09-15 NOTE — Patient Instructions (Addendum)
Visit Information It was great speaking with you today!  Please let me know if you have any questions about our visit.   Goals Addressed            This Visit's Progress   . Chronic Care Management       CARE PLAN ENTRY  Current Barriers:  . Chronic Disease Management support, education, and care coordination needs related to Hypertension, Hyperlipidemia, Diabetes, and Chronic Kidney Disease   Hypertension . Pharmacist Clinical Goal(s): o Over the next 90 days, patient will work with PharmD and providers to achieve BP goal <140/90 . Current regimen:  o Amlodipine 5 mg  o Carvedilol 12.5 mg  o Clonidine 0.1 mg BID  o Furosemide 20 mg  o Lisinopril 20 mg  . Interventions: o Recommend taking amlodipine at night to minimize leg swelling . Patient self care activities - Over the next 90 days, patient will: o Check blood pressure at least twice weekly, document, and provide at future appointments o Ensure daily salt intake < 2300 mg/day o Slowly increase exercise tolerance with a goal of 150 minutes of moderate intensity exercise weekly  Hyperlipidemia . Pharmacist Clinical Goal(s): o Over the next 90 days, patient will work with PharmD and providers to achieve LDL goal < 70 . Current regimen:  o Atorvastatin 40 mg   Diabetes . Pharmacist Clinical Goal(s): o Over the next 90 days, patient will work with PharmD and providers to maintain A1c goal <7% . Current regimen:  o Humalog . Patient self care activities - Over the next 90 days, patient will: o Continue using Dexcom to check blood sugars and provide at future appointments o Contact provider with any episodes of hypoglycemia  Medication management . Pharmacist Clinical Goal(s): o Over the next 90 days, patient will work with PharmD and providers to maintain optimal medication adherence . Current pharmacy: [facility] . Interventions o Comprehensive medication review performed. o Continue current medication management  strategy . Patient self care activities - Over the next 90 days, patient will: o Take medications as prescribed o Report any questions or concerns to PharmD and/or provider(s)       Mr. Merriott was given information about Chronic Care Management services today including:  1. CCM service includes personalized support from designated clinical staff supervised by his physician, including individualized plan of care and coordination with other care providers 2. 24/7 contact phone numbers for assistance for urgent and routine care needs. 3. Standard insurance, coinsurance, copays and deductibles apply for chronic care management only during months in which we provide at least 20 minutes of these services. Most insurances cover these services at 100%, however patients may be responsible for any copay, coinsurance and/or deductible if applicable. This service may help you avoid the need for more expensive face-to-face services. 4. Only one practitioner may furnish and bill the service in a calendar month. 5. The patient may stop CCM services at any time (effective at the end of the month) by phone call to the office staff.  Patient agreed to services and verbal consent obtained.   The patient verbalized understanding of instructions provided today and agreed to receive a mailed copy of patient instruction and/or educational materials. Telephone follow up appointment with pharmacy team member scheduled for: 12/15/19 at 9:00 AM   [place] at [place]  [place] stands for "Dietary Approaches to Stop Hypertension." The DASH eating plan is a healthy eating plan that  has been shown to reduce high blood pressure (hypertension). It may also reduce your risk for type 2 diabetes, heart disease, and stroke. The DASH eating plan may also help with weight loss. What are tips for following this plan?  General guidelines  Avoid  eating more than 2,300 mg (milligrams) of salt (sodium) a day. If you have hypertension, you may need to reduce your sodium intake to 1,500 mg a day.  Limit alcohol intake to no more than 1 drink a day for nonpregnant women and 2 drinks a day for men. One drink equals 12 oz of beer, 5 oz of wine, or 1 oz of hard liquor.  Work with your health care provider to maintain a healthy body weight or to lose weight. Ask what an ideal weight is for you.  Get at least 30 minutes of exercise that causes your heart to beat faster (aerobic exercise) most days of the week. Activities may include walking, swimming, or biking.  Work with your health care provider or diet and nutrition specialist (dietitian) to adjust your eating plan to your individual calorie needs. Reading food labels   Check food labels for the amount of sodium per serving. Choose foods with less than 5 percent of the Daily Value of sodium. Generally, foods with less than 300 mg of sodium per serving fit into this eating plan.  To find whole grains, look for the word "whole" as the first word in the ingredient list. Shopping  Buy products labeled as "low-sodium" or "no salt added."  Buy fresh foods. Avoid canned foods and premade or frozen meals. Cooking  Avoid adding salt when cooking. Use salt-free seasonings or herbs instead of table salt or sea salt. Check with your health care provider or pharmacist before using salt substitutes.  Do not fry foods. Cook foods using healthy methods such as baking, boiling, grilling, and broiling instead.  Cook with heart-healthy oils, such as olive, canola, soybean, or sunflower oil. Meal planning  Eat a balanced diet that includes: ? 5 or more servings of fruits and vegetables each day. At each meal, try to fill half of your plate with fruits and vegetables. ? Up to 6-8 servings of whole grains each day. ? Less than 6 oz of lean meat, poultry, or fish each day. A 3-oz serving of meat is  about the same size as a deck of cards. One egg equals 1 oz. ? 2 servings of low-fat dairy each day. ? A serving of nuts, seeds, or beans 5 times each week. ? Heart-healthy fats. Healthy fats called Omega-3 fatty acids are found in foods such as flaxseeds and coldwater fish, like sardines, salmon, and mackerel.  Limit how much you eat of the following: ? Canned or prepackaged foods. ? Food that is high in trans fat, such as fried foods. ? Food that is high in saturated fat, such as fatty meat. ? Sweets, desserts, sugary drinks, and other foods with added sugar. ? Full-fat dairy products.  Do not salt foods before eating.  Try to eat at least 2 vegetarian meals each week.  Eat more home-cooked food and less restaurant, buffet, and fast food.  When eating at a restaurant, ask that your food be prepared with less salt or no salt, if possible. What foods are recommended? The items listed may not be a complete list. Talk with your dietitian about what dietary choices are best for you. Grains Whole-grain or whole-wheat bread. Whole-grain or whole-wheat pasta. Brown rice. [place].  Bulgur. Whole-grain and low-sodium cereals. Pita bread. Low-fat, low-sodium crackers. Whole-wheat flour tortillas. Vegetables Fresh or frozen vegetables (raw, steamed, roasted, or grilled). Low-sodium or reduced-sodium tomato and vegetable juice. Low-sodium or reduced-sodium tomato sauce and tomato paste. Low-sodium or reduced-sodium canned vegetables. Fruits All fresh, dried, or frozen fruit. Canned fruit in natural juice (without added sugar). Meat and other protein foods Skinless chicken or [place]. Ground chicken or [place]. Pork with fat trimmed off. Fish and seafood. Egg whites. Dried beans, peas, or lentils. Unsalted nuts, nut butters, and seeds. Unsalted canned beans. Lean cuts of beef with fat trimmed off. Low-sodium, lean deli meat. Dairy Low-fat (1%) or fat-free (skim) milk. Fat-free, low-fat, or  reduced-fat cheeses. Nonfat, low-sodium ricotta or cottage cheese. Low-fat or nonfat yogurt. Low-fat, low-sodium cheese. Fats and oils Soft margarine without trans fats. Vegetable oil. Low-fat, reduced-fat, or light mayonnaise and salad dressings (reduced-sodium). Canola, safflower, olive, soybean, and sunflower oils. Avocado. Seasoning and other foods Herbs. Spices. Seasoning mixes without salt. Unsalted popcorn and pretzels. Fat-free sweets. What foods are not recommended? The items listed may not be a complete list. Talk with your dietitian about what dietary choices are best for you. Grains Baked goods made with fat, such as croissants, muffins, or some breads. Dry pasta or rice meal packs. Vegetables Creamed or fried vegetables. Vegetables in a cheese sauce. Regular canned vegetables (not low-sodium or reduced-sodium). Regular canned tomato sauce and paste (not low-sodium or reduced-sodium). Regular tomato and vegetable juice (not low-sodium or reduced-sodium). Angie Fava. Olives. Fruits Canned fruit in a light or heavy syrup. Fried fruit. Fruit in cream or butter sauce. Meat and other protein foods Fatty cuts of meat. Ribs. Fried meat. Berniece Salines. Sausage. Bologna and other processed lunch meats. Salami. Fatback. Hotdogs. Bratwurst. Salted nuts and seeds. Canned beans with added salt. Canned or smoked fish. Whole eggs or egg yolks. Chicken or [place] with skin. Dairy Whole or 2% milk, cream, and half-and-half. Whole or full-fat cream cheese. Whole-fat or sweetened yogurt. Full-fat cheese. Nondairy creamers. Whipped toppings. Processed cheese and cheese spreads. Fats and oils Butter. Stick margarine. Lard. Shortening. Ghee. Bacon fat. Tropical oils, such as coconut, palm kernel, or palm oil. Seasoning and other foods Salted popcorn and pretzels. Onion salt, garlic salt, seasoned salt, table salt, and sea salt. Worcestershire sauce. Tartar sauce. Barbecue sauce. Teriyaki sauce. Soy sauce, including  reduced-sodium. Steak sauce. Canned and packaged gravies. Fish sauce. Oyster sauce. Cocktail sauce. Horseradish that you find on the shelf. Ketchup. Mustard. Meat flavorings and tenderizers. Bouillon cubes. Hot sauce and Tabasco sauce. Premade or packaged marinades. Premade or packaged taco seasonings. Relishes. Regular salad dressings. Where to find more information:  National Heart, Lung, and [place]: https://wilson-eaton.com/  American Heart Association: www.heart.org Summary  The DASH eating plan is a healthy eating plan that has been shown to reduce high blood pressure (hypertension). It may also reduce your risk for type 2 diabetes, heart disease, and stroke.  With the DASH eating plan, you should limit salt (sodium) intake to 2,300 mg a day. If you have hypertension, you may need to reduce your sodium intake to 1,500 mg a day.  When on the DASH eating plan, aim to eat more fresh fruits and vegetables, whole grains, lean proteins, low-fat dairy, and heart-healthy fats.  Work with your health care provider or diet and nutrition specialist (dietitian) to adjust your eating plan to your individual calorie needs. This information is not intended to replace advice given to you by your health care provider. Make  sure you discuss any questions you have with your health care provider. Document Revised: 03/28/2017 Document Reviewed: 04/08/2016 Elsevier Patient Education  2020 [employer].

## 2019-09-16 ENCOUNTER — Telehealth: Payer: Self-pay

## 2019-09-16 NOTE — Telephone Encounter (Signed)
-----   Message from [name], MD sent at 09/15/2019 10:53 AM EDT ----- Regarding: RE: CCM Recommendations [name], Vit D is in the normal range but I am worried about his bone health. He had a osteoporotic fracture. Okay to switch him to OTC. I must have put him on tamsulosin to help with urine flow. Okay to stop if he doesn't need it. Thanks.  ----- Message ----- From: Germaine Pomfret, [facility] Sent: 09/15/2019   9:42 AM EDT To: [name], MD Subject: CCM Recommendations                            Dr. Ethelene Hal,  Please see my note for full details but in summary:   - What would you think about having the patient stop tamsulosin? I don't see a clear indication of BPH or urinary retention and it may help his balance by cutting down on potential polypharmacy interactions  - What would you think about having the patient stop his ergocalciferol now that his Vitamin D level is stable and switching to a daily vitamin D supplement? Would you want to wait until his DEXA next week?  Please let me know your thoughts and I can communicate them to the patient!   Thanks,  Cristie Hem

## 2019-09-16 NOTE — Telephone Encounter (Signed)
Instructed patient to start a Calcium + vitamin D supplement and to stop tamsulosin. Patient verbalized his understanding of the plan and repeated it back to me. Reminded patient of his DEXA scan on 09/20/19.   [place] at [facility]  [phone]

## 2019-09-17 ENCOUNTER — Encounter: Payer: Self-pay | Admitting: Family Medicine

## 2019-09-17 ENCOUNTER — Encounter: Payer: Self-pay | Admitting: Adult Health

## 2019-09-17 DIAGNOSIS — G4733 Obstructive sleep apnea (adult) (pediatric): Secondary | ICD-10-CM

## 2019-09-20 ENCOUNTER — Other Ambulatory Visit: Payer: Self-pay

## 2019-09-20 ENCOUNTER — Encounter: Payer: Self-pay | Admitting: Adult Health

## 2019-09-20 ENCOUNTER — Ambulatory Visit (HOSPITAL_BASED_OUTPATIENT_CLINIC_OR_DEPARTMENT_OTHER)
Admission: RE | Admit: 2019-09-20 | Discharge: 2019-09-20 | Disposition: A | Discharge status: Home / Self Care | Payer: PPO | Source: Ambulatory Visit | Attending: Family Medicine | Admitting: Family Medicine

## 2019-09-20 DIAGNOSIS — M85851 Other specified disorders of bone density and structure, right thigh: Secondary | ICD-10-CM | POA: Insufficient documentation

## 2019-09-20 DIAGNOSIS — X58XXXA Exposure to other specified factors, initial encounter: Secondary | ICD-10-CM | POA: Diagnosis not present

## 2019-09-20 DIAGNOSIS — S72001S Fracture of unspecified part of neck of right femur, sequela: Secondary | ICD-10-CM | POA: Diagnosis not present

## 2019-09-20 DIAGNOSIS — Z13820 Encounter for screening for osteoporosis: Secondary | ICD-10-CM | POA: Diagnosis not present

## 2019-09-20 NOTE — Telephone Encounter (Signed)
Please ask him to increase in the clonidine to   . 2 mg twice daily. He has an appointment with me in June.

## 2019-09-21 NOTE — Telephone Encounter (Signed)
Order for mask refitting sent to [facility] via community message. Confirmation received that the order transmitted was successful.

## 2019-09-21 NOTE — Progress Notes (Signed)
Order for Cpap supplies and mask fitting sent to [facility] via community msg. Confirmation received that the order transmitted was successful.

## 2019-09-28 ENCOUNTER — Encounter: Payer: Self-pay | Admitting: Family Medicine

## 2019-09-28 DIAGNOSIS — E109 Type 1 diabetes mellitus without complications: Secondary | ICD-10-CM | POA: Diagnosis not present

## 2019-09-28 NOTE — Telephone Encounter (Signed)
Would go ahead and use D3.

## 2019-09-29 ENCOUNTER — Other Ambulatory Visit: Payer: Self-pay | Admitting: Family Medicine

## 2019-09-29 DIAGNOSIS — I10 Essential (primary) hypertension: Secondary | ICD-10-CM

## 2019-09-29 DIAGNOSIS — E782 Mixed hyperlipidemia: Secondary | ICD-10-CM

## 2019-09-29 DIAGNOSIS — N179 Acute kidney failure, unspecified: Secondary | ICD-10-CM

## 2019-09-30 ENCOUNTER — Encounter: Payer: Self-pay | Admitting: Adult Health

## 2019-10-04 ENCOUNTER — Other Ambulatory Visit: Payer: Self-pay

## 2019-10-04 ENCOUNTER — Ambulatory Visit (INDEPENDENT_AMBULATORY_CARE_PROVIDER_SITE_OTHER): Payer: PPO | Admitting: Family Medicine

## 2019-10-04 ENCOUNTER — Encounter: Payer: Self-pay | Admitting: Family Medicine

## 2019-10-04 VITALS — BP 140/68 | HR 77 | Temp 98.1°F | Ht 68.0 in | Wt 196.4 lb

## 2019-10-04 DIAGNOSIS — N1831 Chronic kidney disease, stage 3a: Secondary | ICD-10-CM | POA: Diagnosis not present

## 2019-10-04 DIAGNOSIS — M8580 Other specified disorders of bone density and structure, unspecified site: Secondary | ICD-10-CM | POA: Insufficient documentation

## 2019-10-04 DIAGNOSIS — E1021 Type 1 diabetes mellitus with diabetic nephropathy: Secondary | ICD-10-CM | POA: Diagnosis not present

## 2019-10-04 DIAGNOSIS — I10 Essential (primary) hypertension: Secondary | ICD-10-CM

## 2019-10-04 MED ORDER — CALCIUM CARBONATE 1500 (600 CA) MG PO TABS: 600.0000 mg | ORAL_TABLET | Freq: Two times a day (BID) | ORAL | 5 refills | Status: DC

## 2019-10-04 MED ORDER — CLONIDINE HCL 0.2 MG PO TABS: 0.2000 mg | ORAL_TABLET | Freq: Two times a day (BID) | ORAL | 1 refills | Status: DC

## 2019-10-04 MED ORDER — ALENDRONATE SODIUM 70 MG PO TABS: 70.0000 mg | ORAL_TABLET | ORAL | 11 refills | Status: DC

## 2019-10-04 NOTE — Patient Instructions (Signed)
Alendronate weekly tablets What is this medicine? ALENDRONATE (a LEN droe nate) slows calcium loss from bones. It helps to make healthy bone and to slow bone loss in people with osteoporosis. It may be used to treat Paget's disease. This medicine may be used for other purposes; ask your health care provider or pharmacist if you have questions. COMMON BRAND NAME(S): Fosamax What should I tell my health care provider before I take this medicine? They need to know if you have any of these conditions:  esophagus, stomach, or intestine problems, like acid-reflux or GERD  dental disease  kidney disease  low blood calcium  low vitamin D  problems swallowing  problems sitting or standing for 30 minutes  an unusual or allergic reaction to alendronate, other medicines, foods, dyes, or preservatives  pregnant or trying to get pregnant  breast-feeding How should I use this medicine? You must take this medicine exactly as directed or you will lower the amount of medicine you absorb into your body or you may cause yourself harm. Take your dose by mouth first thing in the morning, after you are up for the day. Do not eat or drink anything before you take this medicine. Swallow your medicine with a full glass (6 to 8 fluid ounces) of plain water. Do not take this tablet with any other drink. Do not chew or crush the tablet. After taking this medicine, do not eat breakfast, drink, or take any medicines or vitamins for at least 30 minutes. Stand or sit up for at least 30 minutes after you take this medicine; do not lie down. Take this medicine on the same day every week. Do not take your medicine more often than directed. Talk to your pediatrician regarding the use of this medicine in children. Special care may be needed. Overdosage: If you think you have taken too much of this medicine contact a poison control center or emergency room at once. NOTE: This medicine is only for you. Do not share this  medicine with others. What if I miss a dose? If you miss a dose, take the dose on the morning after you remember. Then take your next dose on your regular day of the week. Never take 2 tablets on the same day. Do not take double or extra doses. What may interact with this medicine?  aluminum hydroxide  antacids  aspirin  calcium supplements  drugs for inflammation like ibuprofen, naproxen, and others  iron supplements  magnesium supplements  vitamins with minerals This list may not describe all possible interactions. Give your health care provider a list of all the medicines, herbs, non-prescription drugs, or dietary supplements you use. Also tell them if you smoke, drink alcohol, or use illegal drugs. Some items may interact with your medicine. What should I watch for while using this medicine? Visit your doctor or health care professional for regular checks ups. It may be some time before you see benefit from this medicine. Do not stop taking your medication except on your doctor's advice. Your doctor or health care professional may order blood tests and other tests to see how you are doing. You should make sure you get enough calcium and vitamin D while you are taking this medicine, unless your doctor tells you not to. Discuss the foods you eat and the vitamins you take with your health care professional. Some people who take this medicine have severe bone, joint, and/or muscle pain. This medicine may also increase your risk for a broken thigh  bone. Tell your doctor right away if you have pain in your upper leg or groin. Tell your doctor if you have any pain that does not go away or that gets worse. This medicine can make you more sensitive to the sun. If you get a rash while taking this medicine, sunlight may cause the rash to get worse. Keep out of the sun. If you cannot avoid being in the sun, wear protective clothing and use sunscreen. Do not use sun lamps or tanning  beds/booths. What side effects may I notice from receiving this medicine? Side effects that you should report to your doctor or health care professional as soon as possible:  allergic reactions like skin rash, itching or hives, swelling of the face, lips, or tongue  black or tarry stools  bone, muscle or joint pain  changes in vision  chest pain  heartburn or stomach pain  jaw pain, especially after dental work  pain or trouble when swallowing  redness, blistering, peeling or loosening of the skin, including inside the mouth Side effects that usually do not require medical attention (report to your doctor or health care professional if they continue or are bothersome):  changes in taste  diarrhea or constipation  eye pain or itching  headache  nausea or vomiting  stomach gas or fullness This list may not describe all possible side effects. Call your doctor for medical advice about side effects. You may report side effects to FDA at 1-800-FDA-1088. Where should I keep my medicine? Keep out of the reach of children. Store at room temperature of 15 and 30 degrees C (59 and 86 degrees F). Throw away any unused medicine after the expiration date. NOTE: This sheet is a summary. It may not cover all possible information. If you have questions about this medicine, talk to your doctor, pharmacist, or health care provider.  2020 Elsevier/Gold Standard (2011-02-28 09:02:42)  Managing Your Hypertension Hypertension is commonly called high blood pressure. This is when the force of your blood pressing against the walls of your arteries is too strong. Arteries are blood vessels that carry blood from your heart throughout your body. Hypertension forces the heart to work harder to pump blood, and may cause the arteries to become narrow or stiff. Having untreated or uncontrolled hypertension can cause heart attack, stroke, kidney disease, and other problems. What are blood pressure  readings? A blood pressure reading consists of a higher number over a lower number. Ideally, your blood pressure should be below 120/80. The first ("top") number is called the systolic pressure. It is a measure of the pressure in your arteries as your heart beats. The second ("bottom") number is called the diastolic pressure. It is a measure of the pressure in your arteries as the heart relaxes. What does my blood pressure reading mean? Blood pressure is classified into four stages. Based on your blood pressure reading, your health care provider may use the following stages to determine what type of treatment you need, if any. Systolic pressure and diastolic pressure are measured in a unit called mm Hg. Normal  Systolic pressure: below 932.  Diastolic pressure: below 80. Elevated  Systolic pressure: 671-245.  Diastolic pressure: below 80. Hypertension stage 1  Systolic pressure: 809-983.  Diastolic pressure: 38-25. Hypertension stage 2  Systolic pressure: 053 or above.  Diastolic pressure: 90 or above. What health risks are associated with hypertension? Managing your hypertension is an important responsibility. Uncontrolled hypertension can lead to:  A heart attack.  A  stroke.  A weakened blood vessel (aneurysm).  Heart failure.  Kidney damage.  Eye damage.  Metabolic syndrome.  Memory and concentration problems. What changes can I make to manage my hypertension? Hypertension can be managed by making lifestyle changes and possibly by taking medicines. Your health care provider will help you make a plan to bring your blood pressure within a normal range. Eating and drinking   Eat a diet that is high in fiber and potassium, and low in salt (sodium), added sugar, and fat. An example eating plan is called the DASH (Dietary Approaches to Stop Hypertension) diet. To eat this way: ? Eat plenty of fresh fruits and vegetables. Try to fill half of your plate at each meal with  fruits and vegetables. ? Eat whole grains, such as whole wheat pasta, brown rice, or whole grain bread. Fill about one quarter of your plate with whole grains. ? Eat low-fat diary products. ? Avoid fatty cuts of meat, processed or cured meats, and poultry with skin. Fill about one quarter of your plate with lean proteins such as fish, chicken without skin, beans, eggs, and tofu. ? Avoid premade and processed foods. These tend to be higher in sodium, added sugar, and fat.  Reduce your daily sodium intake. Most people with hypertension should eat less than 1,500 mg of sodium a day.  Limit alcohol intake to no more than 1 drink a day for nonpregnant women and 2 drinks a day for men. One drink equals 12 oz of beer, 5 oz of wine, or 1 oz of hard liquor. Lifestyle  Work with your health care provider to maintain a healthy body weight, or to lose weight. Ask what an ideal weight is for you.  Get at least 30 minutes of exercise that causes your heart to beat faster (aerobic exercise) most days of the week. Activities may include walking, swimming, or biking.  Include exercise to strengthen your muscles (resistance exercise), such as weight lifting, as part of your weekly exercise routine. Try to do these types of exercises for 30 minutes at least 3 days a week.  Do not use any products that contain nicotine or tobacco, such as cigarettes and e-cigarettes. If you need help quitting, ask your health care provider.  Control any long-term (chronic) conditions you have, such as high cholesterol or diabetes. Monitoring  Monitor your blood pressure at home as told by your health care provider. Your personal target blood pressure may vary depending on your medical conditions, your age, and other factors.  Have your blood pressure checked regularly, as often as told by your health care provider. Working with your health care provider  Review all the medicines you take with your health care provider  because there may be side effects or interactions.  Talk with your health care provider about your diet, exercise habits, and other lifestyle factors that may be contributing to hypertension.  Visit your health care provider regularly. Your health care provider can help you create and adjust your plan for managing hypertension. Will I need medicine to control my blood pressure? Your health care provider may prescribe medicine if lifestyle changes are not enough to get your blood pressure under control, and if:  Your systolic blood pressure is 130 or higher.  Your diastolic blood pressure is 80 or higher. Take medicines only as told by your health care provider. Follow the directions carefully. Blood pressure medicines must be taken as prescribed. The medicine does not work as well when you  skip doses. Skipping doses also puts you at risk for problems. Contact a health care provider if:  You think you are having a reaction to medicines you have taken.  You have repeated (recurrent) headaches.  You feel dizzy.  You have swelling in your ankles.  You have trouble with your vision. Get help right away if:  You develop a severe headache or confusion.  You have unusual weakness or numbness, or you feel faint.  You have severe pain in your chest or abdomen.  You vomit repeatedly.  You have trouble breathing. Summary  Hypertension is when the force of blood pumping through your arteries is too strong. If this condition is not controlled, it may put you at risk for serious complications.  Your personal target blood pressure may vary depending on your medical conditions, your age, and other factors. For most people, a normal blood pressure is less than 120/80.  Hypertension is managed by lifestyle changes, medicines, or both. Lifestyle changes include weight loss, eating a healthy, low-sodium diet, exercising more, and limiting alcohol. This information is not intended to replace  advice given to you by your health care provider. Make sure you discuss any questions you have with your health care provider. Document Revised: 08/07/2018 Document Reviewed: 03/13/2016 Elsevier Patient Education  [place].

## 2019-10-04 NOTE — Progress Notes (Signed)
Established Patient Office Visit  Subjective:  Patient ID: Adrian Neal, male    DOB: 03-12-1966  Age: 54 y.o. MRN: [id number]  CC:  Chief Complaint  Patient presents with  . Follow-up    follow up on dosage change on medication, no concerns.     HPI Adrian Neal presents for follow-up of his hypertension, vitamin D deficiency and osteopenia with an osteoporotic fracture.  Blood pressure looks good today.  He is actually been able to lose some weight.  Norvasc 5mg  in p.m. is definitely helped the swelling in his legs.  Continues clonidine 0.2 twice daily.  Also continues Coreg, lisinopril and Lasix.  He has no issues with his teeth.  Hip fracture from a standing fall constitutes an osteoporotic fracture.  Past Medical History:  Diagnosis Date  . Cataract 1994   right eye  . Chronic kidney disease   . Diabetes mellitus without complication ([place])    diagnosed at age 20  . Eye problems   . Heart murmur 1996  . High cholesterol    patient denies but take preventative medicine  . Hypertension   . 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 Sleep apnea    on BiPAP  . Stroke [facility]) 2016    Past Surgical History:  Procedure Laterality Date  . ANTERIOR APPROACH HEMI HIP ARTHROPLASTY Left 01/01/2019   Procedure: ANTERIOR APPROACH total hip ARTHROPLASTY;  Surgeon: Rod Can, MD;  Location: [place];  Service: Orthopedics;  Laterality: Left;  . CATARACT EXTRACTION Right 1994  . CATARACT EXTRACTION W/ INTRAOCULAR LENS IMPLANT  1994  . EYE SURGERY Right 1991   vitrectomy  . EYE SURGERY Right 1992   scar tissue removed from retina    Family History  Problem Relation Age of Onset  . Hypertension Mother   . Hyperlipidemia Mother   . Hyperlipidemia Father   . Hypertension Father   . Diabetes Father   . Kidney disease Father        had a kidney transplant   . Stroke Brother   . Diabetes Brother   . Pulmonary fibrosis Paternal Grandmother   . Lung  cancer Paternal Grandfather   . Diabetes Daughter   . Colon cancer Neg Hx   . Esophageal cancer Neg Hx   . Rectal cancer Neg Hx   . Stomach cancer Neg Hx     Social History   Socioeconomic History  . Marital status: Married    Spouse name: Not on file  . Number of children: 3  . Years of education: Not on file  . Highest education level: Not on file  Occupational History  . Occupation: Disabled  Tobacco Use  . Smoking status: Former Smoker    Packs/day: 1.00    Years: 6.00    Pack years: 6.00    Quit date: 04/30/1991    Years since quitting: 28.4  . Smokeless tobacco: Never Used  Substance and Sexual Activity  . Alcohol use: No  . Drug use: Never  . Sexual activity: Not on file  Other Topics Concern  . Not on file  Social History Narrative   Right handed   Lives wife and kids in a two story home   Social Determinants of Health   Financial Resource Strain: Low Risk   . Difficulty of Paying Living Expenses: Not hard at all  Food Insecurity: No Food Insecurity  . Worried About Charity fundraiser in the Last  Year: Never true  . Ran Out of Food in the Last Year: Never true  Transportation Needs: No Transportation Needs  . Lack of Transportation (Medical): No  . Lack of Transportation (Non-Medical): No  Physical Activity:   . Days of Exercise per Week:   . Minutes of Exercise per Session:   Stress:   . Feeling of Stress :   Social Connections:   . Frequency of Communication with Friends and Family:   . Frequency of Social Gatherings with Friends and Family:   . Attends Religious Services:   . Active Member of Clubs or Organizations:   . Attends Archivist Meetings:   Marland Kitchen Marital Status:   Intimate Partner Violence:   . Fear of Current or Ex-Partner:   . Emotionally Abused:   Marland Kitchen Physically Abused:   . Sexually Abused:     Outpatient Medications Prior to Visit  Medication Sig Dispense Refill  . amLODipine (NORVASC) 10 MG tablet Take 1 tablet by mouth  once daily (Patient taking differently: 5 mg. ) 90 tablet 0  . aspirin EC 81 MG tablet Take 1 tablet (81 mg total) by mouth daily. 365 tablet 1  . atorvastatin (LIPITOR) 40 MG tablet TAKE 1 TABLET BY MOUTH ONCE DAILY AT  6  PM  -  PLEASE  KEEP  UPCOMING  APPT 90 tablet 0  . carvedilol (COREG) 12.5 MG tablet Take 1 tablet (12.5 mg total) by mouth 2 (two) times daily with a meal. 60 tablet 3  . clopidogrel (PLAVIX) 75 MG tablet Take 1 tablet (75 mg total) by mouth daily. Aspirin and Plavix for 3 months, after that-stop Aspirin-take Plavix alone 90 tablet 1  . Continuous Blood Gluc Receiver (DEXCOM G6 RECEIVER) DEVI 1 Device by Does not apply route as directed. 1 Device 0  . Continuous Blood Gluc Sensor (DEXCOM G6 SENSOR) MISC 3 Devices by Does not apply route as directed. 3 each 6  . Continuous Blood Gluc Transmit (DEXCOM G6 TRANSMITTER) MISC 1 Device by Does not apply route as directed. 1 each 6  . fluticasone (FLONASE) 50 MCG/ACT nasal spray Place 2 sprays into both nostrils daily. 16 g 6  . furosemide (LASIX) 20 MG tablet Take 1 tablet (20 mg total) by mouth daily. 90 tablet 3  . Insulin Infusion Pump (T:SLIM INSULIN PUMP) DEVI by Does not apply route.    . insulin lispro (HUMALOG) 100 UNIT/ML injection Max daily dose of 100 units via pump DX E10.59 (vials) 90 mL 4  . lisinopril (ZESTRIL) 20 MG tablet Take 1 tablet by mouth once daily 90 tablet 0  . pantoprazole (PROTONIX) 40 MG tablet Take 1 tablet (40 mg total) by mouth daily. 90 tablet 1  . cloNIDine (CATAPRES) 0.1 MG tablet Take 1 tablet by mouth twice daily 180 tablet 1  . Vitamin D, Ergocalciferol, (DRISDOL) 1.25 MG (50000 UNIT) CAPS capsule Take 1 capsule by mouth once a week 5 capsule 2  . meclizine (ANTIVERT) 25 MG tablet TAKE 1 TABLET BY MOUTH THREE TIMES DAILY AS NEEDED FOR DIZZINESS (Patient not taking: Reported on 09/15/2019) 30 tablet 0  . tamsulosin (FLOMAX) 0.4 MG CAPS capsule TAKE 1 CAPSULE BY MOUTH ONCE DAILY AFTER  BREAKFAST  (Patient not taking: Reported on 10/04/2019) 90 capsule 0   No facility-administered medications prior to visit.    No Known Allergies  ROS Review of Systems  Constitutional: Negative.   Respiratory: Negative.   Cardiovascular: Negative.   Gastrointestinal: Negative.   Endocrine: Negative  for polyphagia and polyuria.  Genitourinary: Negative.   Musculoskeletal: Negative for joint swelling.  Neurological: Negative for speech difficulty and light-headedness.  Psychiatric/Behavioral: Negative.       Objective:    Physical Exam  Constitutional: He is oriented to person, place, and time. He appears well-developed and well-nourished. No distress.  HENT:  Head: Normocephalic and atraumatic.  Right Ear: External ear normal.  Left Ear: External ear normal.  Eyes: Conjunctivae are normal. Right eye exhibits no discharge. Left eye exhibits no discharge. No scleral icterus.  Neck: No JVD present. No tracheal deviation present.  Pulmonary/Chest: Effort normal and breath sounds normal. No stridor.  Musculoskeletal:        General: Edema (1 plus) present.  Neurological: He is alert and oriented to person, place, and time.  Skin: Skin is warm and dry. He is not diaphoretic.  Psychiatric: He has a normal mood and affect. His behavior is normal.    BP 140/68   Pulse 77   Temp 98.1 F (36.7 C) (Tympanic)   Ht 5\' 8"  (1.727 m)   Wt 196 lb 6.4 oz (89.1 kg)   SpO2 96%   BMI 29.86 kg/m  Wt Readings from Last 3 Encounters:  10/04/19 196 lb 6.4 oz (89.1 kg)  09/14/19 204 lb 12.8 oz (92.9 kg)  09/06/19 206 lb 3.2 oz (93.5 kg)     Health Maintenance Due  Topic Date Due  . Hepatitis C Screening  Never done  . COVID-19 Vaccine (1) Never done    There are no preventive care reminders to display for this patient.  Lab Results  Component Value Date   TSH 4.36 06/03/2019   Lab Results  Component Value Date   WBC 8.9 01/08/2019   HGB 7.9 (L) 01/08/2019   HCT 23.9 (L) 01/08/2019    MCV 87.9 01/08/2019   PLT 195 01/08/2019   Lab Results  Component Value Date   NA 139 09/06/2019   K 4.1 09/06/2019   CO2 24 09/06/2019   GLUCOSE 255 (H) 09/06/2019   BUN 34 (H) 09/06/2019   CREATININE 1.91 (H) 09/06/2019   BILITOT 1.4 (H) 01/01/2019   ALKPHOS 98 01/01/2019   AST 18 01/01/2019   ALT 31 01/01/2019   PROT 5.7 (L) 01/01/2019   ALBUMIN 3.2 (L) 01/01/2019   CALCIUM 8.8 09/06/2019   ANIONGAP 9 01/03/2019   GFR 36.93 (L) 09/06/2019   Lab Results  Component Value Date   CHOL 107 10/28/2018   Lab Results  Component Value Date   HDL 35.10 (L) 10/28/2018   Lab Results  Component Value Date   LDLCALC 60 10/28/2018   Lab Results  Component Value Date   TRIG 58.0 10/28/2018   Lab Results  Component Value Date   CHOLHDL 3 10/28/2018   Lab Results  Component Value Date   HGBA1C 6.8 (A) 07/13/2019      Assessment & Plan:   Problem List Items Addressed This Visit      Cardiovascular and Mediastinum   Essential hypertension   Relevant Medications   cloNIDine (CATAPRES) 0.2 MG tablet   Other Relevant Orders   Amb ref to Medical Nutrition Therapy-MNT     Musculoskeletal and Integument   Osteopenia - Primary   Relevant Medications   calcium carbonate (OSCAL) 1500 (600 Ca) MG TABS tablet   alendronate (FOSAMAX) 70 MG tablet      Meds ordered this encounter  Medications  . cloNIDine (CATAPRES) 0.2 MG tablet    Sig: Take  1 tablet (0.2 mg total) by mouth 2 (two) times daily.    Dispense:  180 tablet    Refill:  1  . calcium carbonate (OSCAL) 1500 (600 Ca) MG TABS tablet    Sig: Take 1 tablet (1,500 mg total) by mouth 2 (two) times daily with a meal.    Dispense:  180 tablet    Refill:  5  . alendronate (FOSAMAX) 70 MG tablet    Sig: Take 1 tablet (70 mg total) by mouth every 7 (seven) days. Take with a full glass of water on an empty stomach.    Dispense:  4 tablet    Refill:  11    Follow-up: Return in about 3 months (around 01/04/2020).    Discontinued high-dose vitamin D in favor 1000 international units daily.  Continue blood pressure medicines as above.  Start Fosamax 70 mg weekly.  Patient given information on this medication.  Start calcium 600 mg twice daily.  With further weight loss may be able to discontinue the amlodipine.  Request nutritional consult for help. [name], MD

## 2019-10-05 ENCOUNTER — Other Ambulatory Visit: Payer: Self-pay | Admitting: Family Medicine

## 2019-10-05 DIAGNOSIS — I10 Essential (primary) hypertension: Secondary | ICD-10-CM

## 2019-10-05 DIAGNOSIS — Z8673 Personal history of transient ischemic attack (TIA), and cerebral infarction without residual deficits: Secondary | ICD-10-CM

## 2019-10-06 ENCOUNTER — Telehealth: Payer: Self-pay | Admitting: Family Medicine

## 2019-10-06 NOTE — Telephone Encounter (Signed)
Patient called and stated that Dr. Ethelene Hal put in a referral to see a dietician and the way it was entered the office will not see patient. Patient is asking if referral be updated to patient needing the appointment to manage diabetes. Pls advise. CB is [phone].

## 2019-10-06 NOTE — Telephone Encounter (Signed)
[id number]-Plz see note from patient stating need to correct referral/thx dmf

## 2019-10-07 NOTE — Telephone Encounter (Signed)
done

## 2019-10-07 NOTE — Telephone Encounter (Signed)
LMOVM that referral has been corrected/thx dmf

## 2019-10-11 DIAGNOSIS — E109 Type 1 diabetes mellitus without complications: Secondary | ICD-10-CM | POA: Diagnosis not present

## 2019-10-12 DIAGNOSIS — E109 Type 1 diabetes mellitus without complications: Secondary | ICD-10-CM | POA: Diagnosis not present

## 2019-10-21 ENCOUNTER — Encounter (INDEPENDENT_AMBULATORY_CARE_PROVIDER_SITE_OTHER): Payer: Self-pay

## 2019-11-10 DIAGNOSIS — E109 Type 1 diabetes mellitus without complications: Secondary | ICD-10-CM | POA: Diagnosis not present

## 2019-11-11 DIAGNOSIS — E109 Type 1 diabetes mellitus without complications: Secondary | ICD-10-CM | POA: Diagnosis not present

## 2019-11-12 ENCOUNTER — Ambulatory Visit: Payer: PPO | Admitting: Internal Medicine

## 2019-11-16 ENCOUNTER — Other Ambulatory Visit: Payer: Self-pay

## 2019-11-16 ENCOUNTER — Encounter: Payer: Self-pay | Admitting: Internal Medicine

## 2019-11-16 ENCOUNTER — Ambulatory Visit (INDEPENDENT_AMBULATORY_CARE_PROVIDER_SITE_OTHER): Payer: PPO | Admitting: Internal Medicine

## 2019-11-16 VITALS — BP 132/62 | HR 62 | Ht 68.0 in | Wt 190.0 lb

## 2019-11-16 DIAGNOSIS — N1831 Chronic kidney disease, stage 3a: Secondary | ICD-10-CM | POA: Diagnosis not present

## 2019-11-16 DIAGNOSIS — E10319 Type 1 diabetes mellitus with unspecified diabetic retinopathy without macular edema: Secondary | ICD-10-CM

## 2019-11-16 DIAGNOSIS — E1021 Type 1 diabetes mellitus with diabetic nephropathy: Secondary | ICD-10-CM

## 2019-11-16 LAB — POCT GLYCOSYLATED HEMOGLOBIN (HGB A1C): Hemoglobin A1C: 6 % — AB (ref 4.0–5.6)

## 2019-11-16 NOTE — Patient Instructions (Signed)
-   keep up the good work.  - Please notify us should you notice sugars less than 70 mg/dL        HOW TO TREAT LOW BLOOD SUGARS (Blood sugar LESS THAN 70 MG/DL)  Please follow the RULE OF 15 for the treatment of hypoglycemia treatment (when your (blood sugars are less than 70 mg/dL)    STEP 1: Take 15 grams of carbohydrates when your blood sugar is low, which includes:   3-4 GLUCOSE TABS  OR  3-4 OZ OF JUICE OR REGULAR SODA OR  ONE TUBE OF GLUCOSE GEL     STEP 2: RECHECK blood sugar in 15 MINUTES STEP 3: If your blood sugar is still low at the 15 minute recheck --> then, go back to STEP 1 and treat AGAIN with another 15 grams of carbohydrates.

## 2019-11-16 NOTE — Progress Notes (Signed)
Name: Adrian Neal  Age/ Sex: 54 y.o., male   MRN/ DOB: [id number], 09/21/1965     PCP: [name], MD   Reason for Endocrinology Evaluation: Type 1 Diabetes Mellitus  Initial Endocrine Consultative Visit: 06/18/2018    PATIENT IDENTIFIER: Adrian Neal is a 54 y.o. male with a past medical history of .HTN,Hyperlipidemia and Hx of CVA  The patient has followed with Endocrinology clinic since 06/18/2018 for consultative assistance with management of his diabetes.  DIABETIC HISTORY:  Adrian Neal was diagnosed with T1DM at age 41, he has been on insulin pump since 2012. His hemoglobin A1c has ranged from 7.2% , peaking at 9.0%   In 11/2018 he switched to the T-Slim   SUBJECTIVE:   During the last visit (07/13/2019): A1c 6.8 %. we adjusted his I:C ratio during the day   Today (11/16/2019): Adrian Neal is here for a follow up on diabetes management and his new pump.  He checks his blood sugars multiple times daily, preprandial and bedtime. The patient has had hypoglycemic episodes since the last clinic visit, which typically occur 2 x /week - most often occuring during the day after meals. The patient is symptomatic with these episodes.. Otherwise, the patient has not required any recent emergency interventions for hypoglycemia and has not had recent hospitalizations secondary to hyper or hypoglycemic episodes.    This patient with type 1 diabetes is treated with humalog (insulin pump). The clinical list was updated.   Current pump settings    Pump   T-Slim      Insulin type   HUMALOG    Basal rate       0000-0000   1.0 u/h               I:C ratio   0000-0600 1:9   0600-1100  1:7   1100-0000 1:9      Sensitivity      0000 30      AIT      0000  5      Goal       0000  110             Type & Model of Pump:T-Slim  Insulin Type: Currently using Humalog   PUMP STATISTICS:  7/6-7/19/2021 Average BG Dashboard : 148 BG Readings:  284.14/ day Average Total Daily Insulin: 53.08  Average Daily Basal: 22.31(42 %) Average Daily Bolus: 23.79(45 %)     CONTINUOUS GLUCOSE MONITORING RECORD INTERPRETATION    Dates of Recording: 7/6-7/19/2021  Sensor description: Dexcom  Results statistics:   Average and SD 148/40  Time in range  81 %  % Time Above 180 17  % Time Below target 0    Glycemic patterns summary: Optimal BG's except for the occasional hyperglycemia at dinner time   Hyperglycemic episodes  Post dinner   Hypoglycemic episodes occurred N/A   Overnight periods: stable      ROS: As per HPI and as detailed below: Review of Systems  Cardiovascular: Negative for chest pain and palpitations.  Gastrointestinal: Negative for diarrhea and nausea.      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   Sleep apnea    on BiPAP   Stroke [facility]) 2016   Past Surgical History:  Past Surgical History:  Procedure Laterality Date   ANTERIOR APPROACH HEMI HIP ARTHROPLASTY Left 01/01/2019   Procedure: ANTERIOR APPROACH total hip ARTHROPLASTY;  Surgeon: Rod Can, MD;  Location: [place];  Service: Orthopedics;  Laterality: Left;   CATARACT EXTRACTION Right 1994   CATARACT EXTRACTION W/ INTRAOCULAR LENS IMPLANT  1994   EYE SURGERY Right 1991   vitrectomy   EYE SURGERY Right 1992   scar tissue removed from retina    Social History:  reports that he quit smoking about 28 years ago. He has a 6.00 pack-year smoking history. He has never used smokeless tobacco. He reports that he does not drink alcohol and does not use drugs. Family History:  Family History   Problem Relation Age of Onset   Hypertension Mother    Hyperlipidemia Mother    Hyperlipidemia Father    Hypertension Father    Diabetes Father    Kidney disease Father        had a kidney transplant    Stroke Brother    Diabetes Brother    Pulmonary fibrosis Paternal Grandmother    Lung cancer Paternal Grandfather    Diabetes Daughter    Colon cancer Neg Hx    Esophageal cancer Neg Hx    Rectal cancer Neg Hx    Stomach cancer Neg Hx     HOME MEDICATIONS: Allergies as of 11/16/2019   No Known Allergies     Medication List       Accurate as of November 16, 2019  1:59 PM. If you have any questions, ask your nurse or doctor.        STOP taking these medications   fluticasone 50 MCG/ACT nasal spray Commonly known as: FLONASE Stopped by: Dorita Sciara, MD     TAKE these medications   alendronate 70 MG tablet Commonly known as: FOSAMAX Take 1 tablet (70 mg total) by mouth every 7 (seven) days. Take with a full glass of water on an empty stomach.   amLODipine 10 MG tablet Commonly known as: NORVASC Take 0.5 tablets (5 mg total) by mouth daily.   aspirin EC 81 MG tablet Take 1 tablet (81 mg total) by mouth daily.   atorvastatin 40 MG tablet Commonly known as: LIPITOR TAKE 1 TABLET BY MOUTH ONCE DAILY AT  6  PM  -  PLEASE  KEEP  UPCOMING  APPT   calcium carbonate 1500 (600 Ca) MG Tabs tablet Commonly known as: OSCAL Take 1 tablet (1,500 mg total) by mouth 2 (two) times daily with a meal.   carvedilol 12.5 MG tablet Commonly known as: COREG Take 1 tablet (12.5 mg total) by mouth 2 (two) times daily with a meal.   cloNIDine 0.2 MG tablet Commonly known as: Catapres Take 1 tablet (0.2 mg total) by mouth 2 (two) times daily.   clopidogrel 75 MG tablet Commonly known as: PLAVIX TAKE 1 TABLET BY MOUTH ONCE DAILY -ASPIRIN  AND  PLAVIX  FOR  3  MONTHS,  AFTER  THAT-  STOP  ASPIRIN-  TAKE  PLAVIX  ALONE   Dexcom G6 Receiver Devi 1 Device by Does  not apply route as directed.   Dexcom G6 Sensor Misc 3 Devices by Does not apply route as directed.  Dexcom G6 Transmitter Misc 1 Device by Does not apply route as directed.   furosemide 20 MG tablet Commonly known as: LASIX Take 1 tablet (20 mg total) by mouth daily.   insulin lispro 100 UNIT/ML injection Commonly known as: HumaLOG Max daily dose of 100 units via pump DX E10.59 (vials)   lisinopril 20 MG tablet Commonly known as: ZESTRIL Take 1 tablet by mouth once daily   pantoprazole 40 MG tablet Commonly known as: PROTONIX Take 1 tablet (40 mg total) by mouth daily.   T:slim Insulin Pump Devi by Does not apply route.      PHYSICAL EXAM: VS: BP 132/62 (BP Location: Right Arm, Patient Position: Sitting, Cuff Size: Normal)    Pulse 62    Ht 5\' 8"  (1.727 m)    Wt 190 lb (86.2 kg)    SpO2 98%    BMI 28.89 kg/m    EXAM: General: Pt appears well and is in NAD  Lungs: Clear with good BS bilat with no rales, rhonchi, or wheezes  Heart: Auscultation: RRR   Extremities:  BL LE: no pretibial edema   Mental Status: Judgment, insight: intact Orientation: oriented to time, place, and person Mood and affect: no depression, anxiety, or agitation   DM Foot Exam 11/16/2019  The skin of the feet is without sores or ulcerations, toe nails dystrophic The pedal pulses are 1+ on right and 1+ on left. The sensation is decreased  to a screening 5.07, 10 gram monofilament on the right, normal on left      DATA REVIEWED:  Lab Results  Component Value Date   HGBA1C 6.0 (A) 11/16/2019   HGBA1C 6.8 (A) 07/13/2019   HGBA1C 6.8 (A) 04/13/2019   Lab Results  Component Value Date   MICROALBUR 28.4 (H) 10/28/2018   LDLCALC 60 10/28/2018   CREATININE 1.91 (H) 09/06/2019   Lab Results  Component Value Date   MICRALBCREAT 53.4 (H) 10/28/2018    Lab Results  Component Value Date   CHOL 107 10/28/2018   HDL 35.10 (L) 10/28/2018   LDLCALC 60 10/28/2018   LDLDIRECT 73.0  09/06/2019   TRIG 58.0 10/28/2018   CHOLHDL 3 10/28/2018         ASSESSMENT / PLAN / RECOMMENDATIONS:   1) Type 1 Diabetes Mellitus, Optimally controlled, With CKD III, retinopathic and  macrovascular complications - Most recent A1c of 6.0 %. Goal A1c < 7.5 %.     - He has  Made drastic dietary changes and his A1c is at 6.0% but since there;s no evidence of hypoglycemia on the CGM nor on the pump, I am not going to make any changes at this time.    MEDICATIONS:  Pump   T-Slim      Insulin type   HUMALOG    Basal rate       0000-0000   1.0 u/h               I:C ratio   0000-0600 1:9   0600-1100  1:7   1100-0000 1:9      Sensitivity      0000 30      AIT      0000  5      Goal       0000  110      EDUCATION / INSTRUCTIONS:  BG monitoring instructions: Patient is instructed to check his blood sugars 4 times a day, before meals and bedtime.  Call [facility] if: BG  persistently < 70 or > 300.  I reviewed the Rule of 15 for the treatment of hypoglycemia in detail with the patient. Literature supplied.    F/U in 6 months      Signed electronically by: Mack Guise, MD  [facility]  [facility] [address] [place], [place] [address] Phone: [phone] FAX: [phone]   CC: [name], MD [place] [address] Phone: [phone]  Fax: [phone]  Return to Endocrinology clinic as below: Future Appointments  Date Time Provider [place]  11/25/2019  9:00 AM Clydell Hakim, RD [place] [facility]  12/15/2019  9:00 AM [facility] CCM PHARMACIST [facility]  01/04/2020  1:00 PM [name], MD [facility]  02/07/2020  1:00 PM Ward Givens, NP [facility] None  04/14/2020  9:15 AM [name], MD [facility] None

## 2019-11-25 ENCOUNTER — Encounter: Payer: Self-pay | Admitting: Dietician

## 2019-11-25 ENCOUNTER — Other Ambulatory Visit: Payer: Self-pay

## 2019-11-25 ENCOUNTER — Encounter: Payer: PPO | Attending: Family Medicine | Admitting: Dietician

## 2019-11-25 DIAGNOSIS — E1059 Type 1 diabetes mellitus with other circulatory complications: Secondary | ICD-10-CM | POA: Insufficient documentation

## 2019-11-25 NOTE — Patient Instructions (Signed)
Find ways to decrease your sodium intake.  Avoid added salt and processed foods   Rinse canned vegetables  Buy fresh chicken and freeze or buy low sodium chicken  Choose low sodium bacon and sausage and eat less frequently.  Avoid all salt substitutes with potassium chloride Meat portion 2-3 oz portion Bake rather than fry  Aim for 3 Carb Choices per meal (45 grams) +/- 1 either way  Aim for 0-1 Carbs per snack if hungry  Include small amount of protein with your meals and snacks Consider reading food labels for Total Carbohydrate of foods  Stay active.  Consider a personal trainer which may help motivation.

## 2019-11-25 NOTE — Progress Notes (Signed)
Diabetes Self-Management Education  Visit Type: First/Initial  Appt. Start Time: 0925 Appt. End Time: 1030  12/03/2019  [name], identified by name and date of birth, is a 54 y.o. male with a diagnosis of Diabetes: Type 1.   ASSESSMENT Patient is here today alone.  He states that he would like to lose weight and his goal is to eat right.  He would like to lose to 140 lbs. He has stopped drinking diet soda (was drinking about 2 L per day). He states that he does not meet criteria for a transplant due to his kidney function.   History includes Type 1 diabetes (dx at age 34), CVA, retinopathy, HTN, HLD Medications include:  Novolog via T-slim insulin pump Dexcom G6 CGM:  Time in Range 71% for the past 14 days and 68% over the past week.  He states that Time in Range was 80% but celebrated after his MD appointment with such good blood sugar control and weight loss as well as he was feeling week and felt that he needed more food. He carbohydrate counts and feels that he may over count rather than under count. Labs noted to include BUN 34, Creatinine 1.91, Potassium 4.1 GFR 36.9 09/06/2019 and Vitamin D 39 05/2019 A1C 6,0% 11/16/19 decreased from 6.8% 07/13/2019  Weight hx: 193 lbs 11/25/2019 206 lbs 06/2019 Highschool weight was 135 lbs College weight was 140's. Patient Goal 140 lbs  Patient lives with his wife and 69 yo son.  He has 2 other daughters (1 lives in [place] and the other has type 1 diabetes).  His twin brother had type 1 diabetes and has had a Kidney Pancrease transplant. Father had type 1 diabetes as well as his older brother. He is on disability (since the strokes). He does the cooking and they share shopping.  Height 5\' 7"  (1.702 m), weight 193 lb (87.5 kg). Body mass index is 30.23 kg/m.   Diabetes Self-Management Education - 11/25/19 0953      Visit Information   Visit Type First/Initial      Initial Visit   Diabetes Type Type 1    Are you currently  following a meal plan? Yes    What type of meal plan do you follow? carb counting/diabetes    Are you taking your medications as prescribed? Yes    Date Diagnosed Adrian Neal   How would you rate your overall health? Fair      Psychosocial Assessment   Patient Belief/Attitude about Diabetes Motivated to manage diabetes    Self-care barriers None    Self-management support Doctor's office    Other persons present Patient    Patient Concerns Nutrition/Meal planning;Weight Control    Special Needs None    Preferred Learning Style No preference indicated    Learning Readiness Ready    How often do you need to have someone help you when you read instructions, pamphlets, or other written materials from your doctor or pharmacy? 1 - Never    What is the last grade level you completed in school? Associates degree      Pre-Education Assessment   Patient understands the diabetes disease and treatment process. Needs Review    Patient understands incorporating nutritional management into lifestyle. Needs Review    Patient undertands incorporating physical activity into lifestyle. Needs Review    Patient understands using medications safely. Needs Review    Patient understands monitoring blood glucose, interpreting and using results Needs Review  Patient understands prevention, detection, and treatment of acute complications. Needs Review    Patient understands prevention, detection, and treatment of chronic complications. Needs Review    Patient understands how to develop strategies to address psychosocial issues. Needs Review    Patient understands how to develop strategies to promote health/change behavior. Needs Review      Complications   Last HgB A1C per patient/outside source 6 %   11/16/19 decreased from 6.8% 07/13/19   How often do you check your blood sugar? 3-4 times / week    Fasting Blood glucose range (mg/dL) 70-129    Postprandial Blood glucose range (mg/dL) >200    high 200's to low 300's   Number of hypoglycemic episodes per month 2    Can you tell when your blood sugar is low? Yes    What do you do if your blood sugar is low? candy bar or pop tart    Number of hyperglycemic episodes per week 14    Can you tell when your blood sugar is high? Yes    What do you do if your blood sugar is high? insulin      Dietary Intake   Breakfast 3 eggs, 2 strips of bacon or sausage links, rare OJ    Lunch fruit, 1/2 PB sandwich    Snack (afternoon) occasional spoon of PB or potato chips (3-4 chips)    Dinner salad with raisins pineapple, bacon bits, cheese, [employer] [place] OR chicken, yellow rice, corn    Beverage(s) water, crystal lite, occasional regular gatorade, occasional coffee with sweet an low and powdered creamer      Exercise   Exercise Type Light (walking / raking leaves)   [employer]- elyptical, resistance machines   How many days per week to you exercise? 3    How many minutes per day do you exercise? 60    Total minutes per week of exercise 180      Patient Education   Previous Diabetes Education Yes (please comment)   periodically   Nutrition management  Food label reading, portion sizes and measuring food.;Carbohydrate counting;Meal options for control of blood glucose level and chronic complications.    Physical activity and exercise  Role of exercise on diabetes management, blood pressure control and cardiac health.;Helped patient identify appropriate exercises in relation to his/her diabetes, diabetes complications and other health issue.    Monitoring Other (comment)   evaluated control   Psychosocial adjustment Worked with patient to identify barriers to care and solutions;Role of stress on diabetes;Identified and addressed patients feelings and concerns about diabetes    Personal strategies to promote health Lifestyle issues that need to be addressed for better diabetes care      Individualized Goals (developed by patient)   Nutrition Follow meal  plan discussed;Other (comment)   continue carbohydrate counting   Physical Activity Exercise 3-5 times per week;30 minutes per day    Medications take my medication as prescribed    Monitoring  test my blood glucose as discussed    Reducing Risk increase portions of healthy fats;examine blood glucose patterns    Health Coping discuss diabetes with (comment)   MD, RD, CDCES     Post-Education Assessment   Patient understands the diabetes disease and treatment process. Demonstrates understanding / competency    Patient understands incorporating nutritional management into lifestyle. Demonstrates understanding / competency    Patient undertands incorporating physical activity into lifestyle. Demonstrates understanding / competency    Patient understands using medications safely.  Demonstrates understanding / competency    Patient understands monitoring blood glucose, interpreting and using results Demonstrates understanding / competency    Patient understands prevention, detection, and treatment of acute complications. Demonstrates understanding / competency    Patient understands prevention, detection, and treatment of chronic complications. Demonstrates understanding / competency    Patient understands how to develop strategies to address psychosocial issues. Demonstrates understanding / competency    Patient understands how to develop strategies to promote health/change behavior. Demonstrates understanding / competency      Outcomes   Expected Outcomes Demonstrated interest in learning. Expect positive outcomes    Future DMSE PRN    Program Status Completed           Individualized Plan for Diabetes Self-Management Training:   Learning Objective:  Patient will have a greater understanding of diabetes self-management. Patient education plan is to attend individual and/or group sessions per assessed needs and concerns.   Plan:   Patient Instructions  Find ways to decrease your sodium  intake.  Avoid added salt and processed foods   Rinse canned vegetables  Buy fresh chicken and freeze or buy low sodium chicken  Choose low sodium bacon and sausage and eat less frequently.  Avoid all salt substitutes with potassium chloride Meat portion 2-3 oz portion Bake rather than fry  Aim for 3 Carb Choices per meal (45 grams) +/- 1 either way  Aim for 0-1 Carbs per snack if hungry  Include small amount of protein with your meals and snacks Consider reading food labels for Total Carbohydrate of foods  Stay active.  Consider a personal trainer which may help motivation.       Expected Outcomes:  Demonstrated interest in learning. Expect positive outcomes  Education material provided: Meal plan card and Snack sheet  If problems or questions, patient to contact team via:  Phone  Future DSME appointment: PRN

## 2019-11-30 ENCOUNTER — Encounter: Payer: Self-pay | Admitting: Neurology

## 2019-11-30 ENCOUNTER — Encounter: Payer: Self-pay | Admitting: Internal Medicine

## 2019-11-30 NOTE — Telephone Encounter (Signed)
ets see you with your machine in the office, I want to hear the noise and refit you if needed.  Shirlean Mylar Hyler needs to be present.

## 2019-12-01 ENCOUNTER — Telehealth: Payer: Self-pay

## 2019-12-01 NOTE — Telephone Encounter (Signed)
Rep from [facility] called to request last chart note for the patient to get pump supplies-I faxed what was requested

## 2019-12-09 ENCOUNTER — Telehealth: Payer: Self-pay

## 2019-12-09 NOTE — Progress Notes (Signed)
Spoke to patient to confirmed patient Telephone appointment on 12/13/2019 for CCM at 9:00 AM with [name] the Clinical pharmacist.   Patient Verbalized understanding [place] Pharmacist Assistant [phone]

## 2019-12-10 DIAGNOSIS — E109 Type 1 diabetes mellitus without complications: Secondary | ICD-10-CM | POA: Diagnosis not present

## 2019-12-13 ENCOUNTER — Ambulatory Visit: Payer: PPO

## 2019-12-13 DIAGNOSIS — E1022 Type 1 diabetes mellitus with diabetic chronic kidney disease: Secondary | ICD-10-CM

## 2019-12-13 DIAGNOSIS — I10 Essential (primary) hypertension: Secondary | ICD-10-CM

## 2019-12-13 NOTE — Chronic Care Management (AMB) (Signed)
Chronic Care Management Pharmacy  Name: Adrian Neal  MRN: [id number] DOB: 1965/06/12  Chief Complaint/ HPI  Adrian Neal,  54 y.o. , male presents for their Follow-Up CCM visit with the clinical pharmacist via telephone.  PCP : [name], MD   Their chronic conditions include: Hypertension, T1DM, CVA, Chronic kidney disease, hyperlipidemia, Osteopenia  Office Visits: 10/04/19: Patient presented to Dr. Ethelene Hal for follow-up. Patient started on Alendronate and Calcium+D for Osteopenia. Clonidine increased to 0.2 mg twice daily. Ergocalciferol, meclizine, tamsulosin stopped.  09/06/19: Patient presented to Dr. Ethelene Hal for HTN follow-up. BP in clinic 134/66. Labwork stable. Patient possibly not taking carvedilol, concerned about low heart rate. Carvediolol decreased to 12.5 mg BID.   06/03/19: Patient presented to Dr. Ethelene Hal for follow-up. BP in clinic 142/70. Still with edema in legs. Furosemide increased to 20 mg daily. Vit D improved, to continue on ergocalciferol until next visit.   Consult Visit: 11/16/19: Patient presented to Dr. Kelton Pillar (Endocrinology) for follow-up. A1c down to 6.0%. No evidence of hypoglycemia, no changes made to pump settings.  09/14/19: Patient referred to Dr. Tomi Likens (neurology) for vertigo. Advised to discontinue meclizine  08/05/19: Patient presented to Ward Givens, NP, for OSA follow-up. CPAP compliance 100%. Voltaren 2%, Norco discontinued (patient no longer taking) 07/14/19: Patient presented to Dr. Coralyn Pear (ophthalmology) for diabetic eye exam.  07/13/19: Patient presented to Dr. Kelton Pillar (endocrinology) for T1DM follow-up. A1c 6.8%. I:C ratio increased from 0600-1100, sensitivity increased to 30.   Medications: Outpatient Encounter Medications as of 12/13/2019  Medication Sig   alendronate (FOSAMAX) 70 MG tablet Take 1 tablet (70 mg total) by mouth every 7 (seven) days. Take with a full glass of water on an empty stomach.   amLODipine  (NORVASC) 10 MG tablet Take 0.5 tablets (5 mg total) by mouth daily.   aspirin EC 81 MG tablet Take 1 tablet (81 mg total) by mouth daily.   atorvastatin (LIPITOR) 40 MG tablet TAKE 1 TABLET BY MOUTH ONCE DAILY AT  6  PM  -  PLEASE  KEEP  UPCOMING  APPT   Calcium Carbonate-Vitamin D 600-400 MG-UNIT tablet Take 1 tablet by mouth 2 (two) times daily.   carvedilol (COREG) 12.5 MG tablet Take 1 tablet (12.5 mg total) by mouth 2 (two) times daily with a meal.   cloNIDine (CATAPRES) 0.2 MG tablet Take 1 tablet (0.2 mg total) by mouth 2 (two) times daily.   clopidogrel (PLAVIX) 75 MG tablet TAKE 1 TABLET BY MOUTH ONCE DAILY -ASPIRIN  AND  PLAVIX  FOR  3  MONTHS,  AFTER  THAT-  STOP  ASPIRIN-  TAKE  PLAVIX  ALONE   furosemide (LASIX) 20 MG tablet Take 1 tablet (20 mg total) by mouth daily.   insulin lispro (HUMALOG) 100 UNIT/ML injection Max daily dose of 100 units via pump DX E10.59 (vials)   lisinopril (ZESTRIL) 20 MG tablet Take 1 tablet by mouth once daily   pantoprazole (PROTONIX) 40 MG tablet Take 1 tablet (40 mg total) by mouth daily.   calcium carbonate (OSCAL) 1500 (600 Ca) MG TABS tablet Take 1 tablet (1,500 mg total) by mouth 2 (two) times daily with a meal.   Continuous Blood Gluc Receiver (DEXCOM G6 RECEIVER) DEVI 1 Device by Does not apply route as directed.   Continuous Blood Gluc Sensor (DEXCOM G6 SENSOR) MISC 3 Devices by Does not apply route as directed.   Continuous Blood Gluc Transmit (DEXCOM G6 TRANSMITTER) MISC 1 Device by Does not apply  route as directed.   Insulin Infusion Pump (T:SLIM INSULIN PUMP) DEVI by Does not apply route.   No facility-administered encounter medications on file as of 12/13/2019.    SDOH Interventions     Most Recent Value  SDOH Interventions  Financial Strain Interventions Intervention Not Indicated  Transportation Interventions Intervention Not Indicated     Current Diagnosis/Assessment:  Goals Addressed            This Visit's  Progress    Chronic Care Management   On track    CARE PLAN ENTRY  Current Barriers:   Chronic Disease Management support, education, and care coordination needs related to Hypertension, Hyperlipidemia, Diabetes, and Chronic Kidney Disease   Hypertension  Pharmacist Clinical Goal(s): o Over the next 90 days, patient will work with PharmD and providers to achieve BP goal <130/80  Current regimen:  o Amlodipine 5 mg daily  o Carvedilol 12.5 mg twice daily  o Clonidine 0.2 mg twice daily  o Furosemide 20 mg daily  o Lisinopril 20 mg daily   Interventions: o Recommend taking amlodipine at night to minimize leg swelling  Patient self care activities - Over the next 90 days, patient will: o Check blood pressure at least twice weekly, document, and provide at future appointments o Ensure daily salt intake < 2300 mg/day o Slowly increase exercise tolerance with a goal of 150 minutes of moderate intensity exercise weekly  Hyperlipidemia  Pharmacist Clinical Goal(s): o Over the next 90 days, patient will work with PharmD and providers to maintain LDL goal < 70  Current regimen:  o Atorvastatin 40 mg   Diabetes  Pharmacist Clinical Goal(s): o Over the next 90 days, patient will work with PharmD and providers to maintain A1c goal <7%  Current regimen:  o Humalog  Patient self care activities - Over the next 90 days, patient will: o Continue using Dexcom to check blood sugars and provide at future appointments o Contact provider with any episodes of hypoglycemia  Medication management  Pharmacist Clinical Goal(s): o Over the next 90 days, patient will work with PharmD and providers to maintain optimal medication adherence  Current pharmacy: [facility]  Interventions o Comprehensive medication review performed. o Continue current medication management strategy  Patient self care activities - Over the next 90 days, patient will: o Take medications as prescribed o Report  any questions or concerns to PharmD and/or provider(s)     DIET - REDUCE SUGAR INTAKE   On track      Diabetes   Recent Relevant Labs: Lab Results  Component Value Date/Time   HGBA1C 6.0 (A) 11/16/2019 01:38 PM   HGBA1C 6.8 (A) 07/13/2019 03:48 PM   HGBA1C 7.2 (H) 01/01/2019 04:34 AM   HGBA1C 7.7 (H) 01/20/2015 05:17 AM   MICROALBUR 28.4 (H) 10/28/2018 08:39 AM   MICROALBUR 71.1 (H) 06/15/2018 08:51 AM     Checking BG: Daily. Patient has a Dexcom G6 CGM.    Target Date(s)  Number of days worn ? 14 days 5/5-5/19  % of time active ? 70%   Mean Glucose (mg/dL)  160  GMI  = 3.31 + 0.02392 x [mean glucose in mg/dL]  7.1%  Glycemic Variability (%CV) ?36% n/a  Time above >250 mg/dL <5% 5%  Time above 70-180 mg/dL <25% 24%  Time in range: 70-180 mg/dL >70% 70%   Time below 70 mg/dL <4% <1%  Time below 54 mg/dL <1% <0%   Patient has failed these meds in past: n/a Patient  is currently controlled on the following medications:   Insulin Lispro via infusion pump  Last diabetic Foot exam: No results found for: [id number]  Last diabetic Eye exam: 07/14/19    We discussed: diet and exercise extensively. Patient actively carb counting and watching portion sizes. He has made dramatic changes his in his diet and reports he has lost 16 pounds since he has cut out sodas. He has also stopped eating cereal.   Breakfast: eggs and sausage  Lunch: Typically very light  Dinner: Salad with chicken  Drinks: Mainly water with flavorings or artificial sweetner  Plan  Continue current medications   Hypertension   BP today is: >130/80  Office blood pressures are  BP Readings from Last 3 Encounters:  11/16/19 132/62  10/04/19 140/68  09/14/19 (!) 168/75   CMP Latest Ref Rng & Units 09/06/2019 06/03/2019 03/02/2019  Glucose 70 - 99 mg/dL 255(H) 146(H) 141(H)  BUN 6 - 23 mg/dL 34(H) 31(H) 32(H)  Creatinine 0.40 - 1.50 mg/dL 1.91(H) 1.98(H) 2.00(H)  Sodium 135 - 145 mEq/L 139 139 140    Potassium 3.5 - 5.1 mEq/L 4.1 3.6 3.8  Chloride 96 - 112 mEq/L 105 106 104  CO2 19 - 32 mEq/L 24 24 28   Calcium 8.4 - 10.5 mg/dL 8.8 8.9 9.2  Total Protein 6.5 - 8.1 g/dL - - -  Total Bilirubin 0.3 - 1.2 mg/dL - - -  Alkaline Phos 38 - 126 U/L - - -  AST 15 - 41 U/L - - -  ALT 0 - 44 U/L - - -   Patient has failed these meds in the past: hydralazine 50 mg, Toprol XL, ramipril 10 mg  Patient is currently controlled on the following medications:   Amlodipine 5 mg daily (feet swelling with 10 mg)  Carvedilol 12.5 mg BID   Clonidine 0.2 mg BID   Furosemide 20 mg daily   Lisinopril 20 mg daily  Patient checks BP at home 1-2x per week  Patient home BP readings are ranging: 130-140s,   We discussed diet and exercise extensively. Does not salt food. Not walking recently. Some muscular discomfort, but mostly states he has been inactive due to laziness/COVID-19.   Plan  Continue current medications   Hyperlipidemia   History of stroke Mar 2016, Sep 2016  Lipid Panel     Component Value Date/Time   CHOL 107 10/28/2018 0839   TRIG 58.0 10/28/2018 0839   HDL 35.10 (L) 10/28/2018 0839   CHOLHDL 3 10/28/2018 0839   VLDL 11.6 10/28/2018 0839   LDLCALC 60 10/28/2018 0839   LDLDIRECT 73.0 09/06/2019 1658     The ASCVD Risk score (Goff DC Jr., et al., 2013) failed to calculate for the following reasons:   The patient has a prior MI or stroke diagnosis   Patient has failed these meds in past: n/a Patient is currently controlled on the following medications:   Atorvastatin 40 mg daily   Aspirin 81 mg daily  Clopidogrel 75 mg daily  We discussed:  diet and exercise extensively  Plan  Continue current medications   Osteopenia with Osteoporotic Fracture    Last DEXA Scan: 09/20/19:    Z-Score femoral neck: -2.1  Z-Score lumbar spine: -1.5  Z-Score forearm radius: -1.2  10-year probability of major osteoporotic fracture: N/a  10-year probability of hip fracture: n/a  VITD   Date Value Ref Range Status  06/03/2019 39.41 30.00 - 100.00 ng/mL Final     Patient is a candidate for  pharmacologic treatment due to history of hip fracture   Patient has failed these meds in past: ergocalciferol Patient is currently controlled on the following medications:   Alendronate 70 mg weekly (started 10/04/19) Takes mondays   Calcium + Vitamin D twice daily   We discussed:  Recommend [phone] units of vitamin D daily. Recommend 1200 mg of calcium daily from dietary and supplemental sources. Counseled on oral bisphosphonate administration: take in the morning, 30 minutes prior to food with 6-8 oz of water. Do not lie down for at least 30 minutes after taking.  Plan  Continue current medications   Misc/OTC   Flonase 50 mcg/act nasal spray (balance) - ineffective Meclizine 25 mg TID PRN dizziness - stopped  Pantoprazole 40 mg daily - well controlled   Plan  Recommend starting Vitamin D 1000 units daily  Recommend stopping tamsulosin due to lack of clear indication   Vaccines   Reviewed and discussed patient's vaccination history.    Immunization History  Administered Date(s) Administered   Influenza,inj,Quad PF,6+ Mos 07/15/2018, 01/08/2019   Pneumococcal Polysaccharide-23 03/02/2019   Tdap 04/29/2017    Plan  Recommended patient receive Covid-19 vaccine  Medication Management   Pt uses [facility] for all medications Uses pill box? Yes Pt endorses 100% compliance  Plan  Continue current medication management strategy  Follow up:   [place] at [facility]  [phone]

## 2019-12-13 NOTE — Patient Instructions (Addendum)
Visit Information It was great speaking with you today!  Please let me know if you have any questions about our visit. Goals Addressed            This Visit's Progress   . Chronic Care Management   On track    CARE PLAN ENTRY  Current Barriers:  . Chronic Disease Management support, education, and care coordination needs related to Hypertension, Hyperlipidemia, Diabetes, and Chronic Kidney Disease   Hypertension . Pharmacist Clinical Goal(s): o Over the next 90 days, patient will work with PharmD and providers to achieve BP goal <130/80 . Current regimen:  o Amlodipine 5 mg daily  o Carvedilol 12.5 mg twice daily  o Clonidine 0.2 mg twice daily  o Furosemide 20 mg daily  o Lisinopril 20 mg daily  . Interventions: o Recommend taking amlodipine at night to minimize leg swelling . Patient self care activities - Over the next 90 days, patient will: o Check blood pressure at least twice weekly, document, and provide at future appointments o Ensure daily salt intake < 2300 mg/day o Slowly increase exercise tolerance with a goal of 150 minutes of moderate intensity exercise weekly  Hyperlipidemia . Pharmacist Clinical Goal(s): o Over the next 90 days, patient will work with PharmD and providers to maintain LDL goal < 70 . Current regimen:  o Atorvastatin 40 mg   Diabetes . Pharmacist Clinical Goal(s): o Over the next 90 days, patient will work with PharmD and providers to maintain A1c goal <7% . Current regimen:  o Humalog . Patient self care activities - Over the next 90 days, patient will: o Continue using Dexcom to check blood sugars and provide at future appointments o Contact provider with any episodes of hypoglycemia  Medication management . Pharmacist Clinical Goal(s): o Over the next 90 days, patient will work with PharmD and providers to maintain optimal medication adherence . Current pharmacy: [facility] . Interventions o Comprehensive medication review  performed. o Continue current medication management strategy . Patient self care activities - Over the next 90 days, patient will: o Take medications as prescribed o Report any questions or concerns to PharmD and/or provider(s)    . DIET - REDUCE SUGAR INTAKE   On track      The patient verbalized understanding of instructions provided today and agreed to receive a mailed copy of patient instruction and/or educational materials.  Face to Face appointment with pharmacist scheduled for:  06/10/19 at 11:00 AM   [facility] [place]  [place] stands for "Dietary Approaches to Stop Hypertension." The DASH eating plan is a healthy eating plan that has been shown to reduce high blood pressure (hypertension). It may also reduce your risk for type 2 diabetes, heart disease, and stroke. The DASH eating plan may also help with weight loss. What are tips for following this plan?  General guidelines  Avoid eating more than 2,300 mg (milligrams) of salt (sodium) a day. If you have hypertension, you may need to reduce your sodium intake to 1,500 mg a day.  Limit alcohol intake to no more than 1 drink a day for nonpregnant women and 2 drinks a day for men. One drink equals 12 oz of beer, 5 oz of wine, or 1 oz of hard liquor.  Work with your health care provider to maintain a healthy body weight or to lose weight. Ask what an ideal weight is for you.  Get at  least 30 minutes of exercise that causes your heart to beat faster (aerobic exercise) most days of the week. Activities may include walking, swimming, or biking.  Work with your health care provider or diet and nutrition specialist (dietitian) to adjust your eating plan to your individual calorie needs. Reading food labels   Check food labels for the amount of sodium per serving. Choose foods with less than 5 percent of the Daily Value of sodium. Generally,  foods with less than 300 mg of sodium per serving fit into this eating plan.  To find whole grains, look for the word "whole" as the first word in the ingredient list. Shopping  Buy products labeled as "low-sodium" or "no salt added."  Buy fresh foods. Avoid canned foods and premade or frozen meals. Cooking  Avoid adding salt when cooking. Use salt-free seasonings or herbs instead of table salt or sea salt. Check with your health care provider or pharmacist before using salt substitutes.  Do not fry foods. Cook foods using healthy methods such as baking, boiling, grilling, and broiling instead.  Cook with heart-healthy oils, such as olive, canola, soybean, or sunflower oil. Meal planning  Eat a balanced diet that includes: ? 5 or more servings of fruits and vegetables each day. At each meal, try to fill half of your plate with fruits and vegetables. ? Up to 6-8 servings of whole grains each day. ? Less than 6 oz of lean meat, poultry, or fish each day. A 3-oz serving of meat is about the same size as a deck of cards. One egg equals 1 oz. ? 2 servings of low-fat dairy each day. ? A serving of nuts, seeds, or beans 5 times each week. ? Heart-healthy fats. Healthy fats called Omega-3 fatty acids are found in foods such as flaxseeds and coldwater fish, like sardines, salmon, and mackerel.  Limit how much you eat of the following: ? Canned or prepackaged foods. ? Food that is high in trans fat, such as fried foods. ? Food that is high in saturated fat, such as fatty meat. ? Sweets, desserts, sugary drinks, and other foods with added sugar. ? Full-fat dairy products.  Do not salt foods before eating.  Try to eat at least 2 vegetarian meals each week.  Eat more home-cooked food and less restaurant, buffet, and fast food.  When eating at a restaurant, ask that your food be prepared with less salt or no salt, if possible. What foods are recommended? The items listed may not be a  complete list. Talk with your dietitian about what dietary choices are best for you. Grains Whole-grain or whole-wheat bread. Whole-grain or whole-wheat pasta. Brown rice. [place]. Bulgur. Whole-grain and low-sodium cereals. Pita bread. Low-fat, low-sodium crackers. Whole-wheat flour tortillas. Vegetables Fresh or frozen vegetables (raw, steamed, roasted, or grilled). Low-sodium or reduced-sodium tomato and vegetable juice. Low-sodium or reduced-sodium tomato sauce and tomato paste. Low-sodium or reduced-sodium canned vegetables. Fruits All fresh, dried, or frozen fruit. Canned fruit in natural juice (without added sugar). Meat and other protein foods Skinless chicken or [place]. Ground chicken or [place]. Pork with fat trimmed off. Fish and seafood. Egg whites. Dried beans, peas, or lentils. Unsalted nuts, nut butters, and seeds. Unsalted canned beans. Lean cuts of beef with fat trimmed off. Low-sodium, lean deli meat. Dairy Low-fat (1%) or fat-free (skim) milk. Fat-free, low-fat, or reduced-fat cheeses. Nonfat, low-sodium ricotta or cottage cheese. Low-fat or nonfat yogurt. Low-fat, low-sodium cheese. Fats and oils Soft margarine without trans fats.  Vegetable oil. Low-fat, reduced-fat, or light mayonnaise and salad dressings (reduced-sodium). Canola, safflower, olive, soybean, and sunflower oils. Avocado. Seasoning and other foods Herbs. Spices. Seasoning mixes without salt. Unsalted popcorn and pretzels. Fat-free sweets. What foods are not recommended? The items listed may not be a complete list. Talk with your dietitian about what dietary choices are best for you. Grains Baked goods made with fat, such as croissants, muffins, or some breads. Dry pasta or rice meal packs. Vegetables Creamed or fried vegetables. Vegetables in a cheese sauce. Regular canned vegetables (not low-sodium or reduced-sodium). Regular canned tomato sauce and paste (not low-sodium or reduced-sodium). Regular  tomato and vegetable juice (not low-sodium or reduced-sodium). Angie Fava. Olives. Fruits Canned fruit in a light or heavy syrup. Fried fruit. Fruit in cream or butter sauce. Meat and other protein foods Fatty cuts of meat. Ribs. Fried meat. Berniece Salines. Sausage. Bologna and other processed lunch meats. Salami. Fatback. Hotdogs. Bratwurst. Salted nuts and seeds. Canned beans with added salt. Canned or smoked fish. Whole eggs or egg yolks. Chicken or [place] with skin. Dairy Whole or 2% milk, cream, and half-and-half. Whole or full-fat cream cheese. Whole-fat or sweetened yogurt. Full-fat cheese. Nondairy creamers. Whipped toppings. Processed cheese and cheese spreads. Fats and oils Butter. Stick margarine. Lard. Shortening. Ghee. Bacon fat. Tropical oils, such as coconut, palm kernel, or palm oil. Seasoning and other foods Salted popcorn and pretzels. Onion salt, garlic salt, seasoned salt, table salt, and sea salt. Worcestershire sauce. Tartar sauce. Barbecue sauce. Teriyaki sauce. Soy sauce, including reduced-sodium. Steak sauce. Canned and packaged gravies. Fish sauce. Oyster sauce. Cocktail sauce. Horseradish that you find on the shelf. Ketchup. Mustard. Meat flavorings and tenderizers. Bouillon cubes. Hot sauce and Tabasco sauce. Premade or packaged marinades. Premade or packaged taco seasonings. Relishes. Regular salad dressings. Where to find more information:  National Heart, Lung, and Why: https://wilson-eaton.com/  American Heart Association: www.heart.org Summary  The DASH eating plan is a healthy eating plan that has been shown to reduce high blood pressure (hypertension). It may also reduce your risk for type 2 diabetes, heart disease, and stroke.  With the DASH eating plan, you should limit salt (sodium) intake to 2,300 mg a day. If you have hypertension, you may need to reduce your sodium intake to 1,500 mg a day.  When on the DASH eating plan, aim to eat more fresh fruits and  vegetables, whole grains, lean proteins, low-fat dairy, and heart-healthy fats.  Work with your health care provider or diet and nutrition specialist (dietitian) to adjust your eating plan to your individual calorie needs. This information is not intended to replace advice given to you by your health care provider. Make sure you discuss any questions you have with your health care provider. Document Revised: 03/28/2017 Document Reviewed: 04/08/2016 Elsevier Patient Education  2020 [employer].

## 2019-12-15 ENCOUNTER — Telehealth: Payer: PPO

## 2019-12-23 DIAGNOSIS — G4737 Central sleep apnea in conditions classified elsewhere: Secondary | ICD-10-CM | POA: Diagnosis not present

## 2019-12-27 ENCOUNTER — Other Ambulatory Visit: Payer: Self-pay | Admitting: Family Medicine

## 2019-12-27 DIAGNOSIS — I10 Essential (primary) hypertension: Secondary | ICD-10-CM

## 2019-12-27 DIAGNOSIS — E782 Mixed hyperlipidemia: Secondary | ICD-10-CM

## 2020-01-04 ENCOUNTER — Ambulatory Visit (INDEPENDENT_AMBULATORY_CARE_PROVIDER_SITE_OTHER): Payer: PPO | Admitting: Family Medicine

## 2020-01-04 ENCOUNTER — Other Ambulatory Visit: Payer: Self-pay

## 2020-01-04 ENCOUNTER — Encounter: Payer: Self-pay | Admitting: Family Medicine

## 2020-01-04 VITALS — BP 144/76 | HR 75 | Temp 98.6°F | Ht 67.0 in | Wt 194.0 lb

## 2020-01-04 DIAGNOSIS — D649 Anemia, unspecified: Secondary | ICD-10-CM | POA: Insufficient documentation

## 2020-01-04 DIAGNOSIS — E039 Hypothyroidism, unspecified: Secondary | ICD-10-CM

## 2020-01-04 DIAGNOSIS — E782 Mixed hyperlipidemia: Secondary | ICD-10-CM

## 2020-01-04 DIAGNOSIS — T887XXA Unspecified adverse effect of drug or medicament, initial encounter: Secondary | ICD-10-CM | POA: Diagnosis not present

## 2020-01-04 DIAGNOSIS — I10 Essential (primary) hypertension: Secondary | ICD-10-CM | POA: Diagnosis not present

## 2020-01-04 DIAGNOSIS — N1832 Chronic kidney disease, stage 3b: Secondary | ICD-10-CM | POA: Diagnosis not present

## 2020-01-04 LAB — CBC
HCT: 34.5 % — ABNORMAL LOW (ref 39.0–52.0)
Hemoglobin: 11.9 g/dL — ABNORMAL LOW (ref 13.0–17.0)
MCHC: 34.4 g/dL (ref 30.0–36.0)
MCV: 87.4 fl (ref 78.0–100.0)
Platelets: 110 10*3/uL — ABNORMAL LOW (ref 150.0–400.0)
RBC: 3.95 Mil/uL — ABNORMAL LOW (ref 4.22–5.81)
RDW: 13.4 % (ref 11.5–15.5)
WBC: 5.1 10*3/uL (ref 4.0–10.5)

## 2020-01-04 LAB — URINALYSIS, ROUTINE W REFLEX MICROSCOPIC
Bilirubin Urine: NEGATIVE
Hgb urine dipstick: NEGATIVE
Ketones, ur: NEGATIVE
Leukocytes,Ua: NEGATIVE
Nitrite: NEGATIVE
RBC / HPF: NONE SEEN (ref 0–?)
Specific Gravity, Urine: 1.01 (ref 1.000–1.030)
Total Protein, Urine: 100 — AB
Urine Glucose: 250 — AB
Urobilinogen, UA: 0.2 (ref 0.0–1.0)
WBC, UA: NONE SEEN (ref 0–?)
pH: 6.5 (ref 5.0–8.0)

## 2020-01-04 LAB — BASIC METABOLIC PANEL
BUN: 30 mg/dL — ABNORMAL HIGH (ref 6–23)
CO2: 26 mEq/L (ref 19–32)
Calcium: 8.6 mg/dL (ref 8.4–10.5)
Chloride: 104 mEq/L (ref 96–112)
Creatinine, Ser: 1.89 mg/dL — ABNORMAL HIGH (ref 0.40–1.50)
GFR: 37.34 mL/min — ABNORMAL LOW (ref >60.00)
Glucose, Bld: 232 mg/dL — ABNORMAL HIGH (ref 70–99)
Potassium: 4.2 mEq/L (ref 3.5–5.1)
Sodium: 137 mEq/L (ref 135–145)

## 2020-01-04 LAB — TSH: TSH: 3.18 u[IU]/mL (ref 0.35–4.50)

## 2020-01-04 LAB — HEPATIC FUNCTION PANEL
ALT: 18 U/L (ref 0–53)
AST: 17 U/L (ref 0–37)
Albumin: 3.8 g/dL (ref 3.5–5.2)
Alkaline Phosphatase: 81 U/L (ref 39–117)
Bilirubin, Direct: 0.1 mg/dL (ref 0.0–0.3)
Total Bilirubin: 0.7 mg/dL (ref 0.2–1.2)
Total Protein: 6 g/dL (ref 6.0–8.3)

## 2020-01-04 LAB — VITAMIN B12: Vitamin B-12: 371 pg/mL (ref 211–911)

## 2020-01-04 LAB — LDL CHOLESTEROL, DIRECT: Direct LDL: 57 mg/dL

## 2020-01-04 NOTE — Progress Notes (Signed)
Established Patient Office Visit  Subjective:  Patient ID: Adrian Neal, male    DOB: [birth year]  Age: 54 y.o. MRN: [id number]  CC:  Chief Complaint  Patient presents with  . Follow-up    3 mo-osteopenia, BP, DM-not fasting, pt got first pfizer vaccine in April    HPI [facility] presents for follow-up of hypertension hyperlipidemia, osteopenia with osteoparetic hip fracture and chronic kidney disease.  He has been having joint aches and pains since starting the Fosamax.  He continues with calcium and vitamin D.  He is nonfasting today.  Blood pressure is well controlled with the addition of clonidine.  Is tolerating the clonidine well.  Past Medical History:  Diagnosis Date  . Cataract 1994   right eye  . Chronic kidney disease   . Diabetes mellitus without complication ([place])    diagnosed at age 51  . Eye problems   . Heart murmur 1996  . High cholesterol    patient denies but take preventative medicine  . Hypertension   . 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 Sleep apnea    on BiPAP  . Stroke [facility]) 2016    Past Surgical History:  Procedure Laterality Date  . ANTERIOR APPROACH HEMI HIP ARTHROPLASTY Left 01/01/2019   Procedure: ANTERIOR APPROACH total hip ARTHROPLASTY;  Surgeon: Rod Can, MD;  Location: [facility];  Service: Orthopedics;  Laterality: Left;  . CATARACT EXTRACTION Right 1994  . CATARACT EXTRACTION W/ INTRAOCULAR LENS IMPLANT  1994  . EYE SURGERY Right 1991   vitrectomy  . EYE SURGERY Right 1992   scar tissue removed from retina    Family History  Problem Relation Age of Onset  . Hypertension Mother   . Hyperlipidemia Mother   . Hyperlipidemia Father   . Hypertension Father   . Diabetes Father   . Kidney disease Father        had a kidney transplant   . Stroke Brother   . Diabetes Brother   . Pulmonary fibrosis Paternal Grandmother   . Lung cancer Paternal Grandfather   . Diabetes Daughter   . Colon  cancer Neg Hx   . Esophageal cancer Neg Hx   . Rectal cancer Neg Hx   . Stomach cancer Neg Hx     Social History   Socioeconomic History  . Marital status: Married    Spouse name: Not on file  . Number of children: 3  . Years of education: Not on file  . Highest education level: Not on file  Occupational History  . Occupation: Disabled  Tobacco Use  . Smoking status: Former Smoker    Packs/day: 1.00    Years: 6.00    Pack years: 6.00    Quit date: 04/30/1991    Years since quitting: 28.7  . Smokeless tobacco: Never Used  Vaping Use  . Vaping Use: Never used  Substance and Sexual Activity  . Alcohol use: No  . Drug use: Never  . Sexual activity: Not on file  Other Topics Concern  . Not on file  Social History Narrative   Right handed   Lives wife and kids in a two story home   Social Determinants of Health   Financial Resource Strain: Low Risk   . Difficulty of Paying Living Expenses: Not hard at all  Food Insecurity: No Food Insecurity  . Worried About Charity fundraiser in the Last Year: Never true  . Ran Out  of Food in the Last Year: Never true  Transportation Needs: No Transportation Needs  . Lack of Transportation (Medical): No  . Lack of Transportation (Non-Medical): No  Physical Activity:   . Days of Exercise per Week: Not on file  . Minutes of Exercise per Session: Not on file  Stress:   . Feeling of Stress : Not on file  Social Connections:   . Frequency of Communication with Friends and Family: Not on file  . Frequency of Social Gatherings with Friends and Family: Not on file  . Attends Religious Services: Not on file  . Active Member of Clubs or Organizations: Not on file  . Attends Archivist Meetings: Not on file  . Marital Status: Not on file  Intimate Partner Violence:   . Fear of Current or Ex-Partner: Not on file  . Emotionally Abused: Not on file  . Physically Abused: Not on file  . Sexually Abused: Not on file    Outpatient  Medications Prior to Visit  Medication Sig Dispense Refill  . alendronate (FOSAMAX) 70 MG tablet Take 1 tablet (70 mg total) by mouth every 7 (seven) days. Take with a full glass of water on an empty stomach. 4 tablet 11  . amLODipine (NORVASC) 10 MG tablet Take 0.5 tablets (5 mg total) by mouth daily. 90 tablet 1  . aspirin EC 81 MG tablet Take 1 tablet (81 mg total) by mouth daily. 365 tablet 1  . atorvastatin (LIPITOR) 40 MG tablet TAKE 1 TABLET BY MOUTH ONCE DAILY AT  6PM  **PLEASE  KEEP  UPCOMING  APPT** 90 tablet 0  . calcium carbonate (OSCAL) 1500 (600 Ca) MG TABS tablet Take 1 tablet (1,500 mg total) by mouth 2 (two) times daily with a meal. 180 tablet 5  . carvedilol (COREG) 12.5 MG tablet TAKE 1 TABLET BY MOUTH TWICE DAILY WITH A MEAL 60 tablet 0  . cloNIDine (CATAPRES) 0.2 MG tablet Take 1 tablet (0.2 mg total) by mouth 2 (two) times daily. 180 tablet 1  . clopidogrel (PLAVIX) 75 MG tablet TAKE 1 TABLET BY MOUTH ONCE DAILY -ASPIRIN  AND  PLAVIX  FOR  3  MONTHS,  AFTER  THAT-  STOP  ASPIRIN-  TAKE  PLAVIX  ALONE 90 tablet 0  . Continuous Blood Gluc Receiver (DEXCOM G6 RECEIVER) DEVI 1 Device by Does not apply route as directed. 1 Device 0  . Continuous Blood Gluc Sensor (DEXCOM G6 SENSOR) MISC 3 Devices by Does not apply route as directed. 3 each 6  . Continuous Blood Gluc Transmit (DEXCOM G6 TRANSMITTER) MISC 1 Device by Does not apply route as directed. 1 each 6  . furosemide (LASIX) 20 MG tablet Take 1 tablet (20 mg total) by mouth daily. 90 tablet 3  . Insulin Infusion Pump (T:SLIM INSULIN PUMP) DEVI by Does not apply route.    . insulin lispro (HUMALOG) 100 UNIT/ML injection Max daily dose of 100 units via pump DX E10.59 (vials) 90 mL 4  . lisinopril (ZESTRIL) 20 MG tablet Take 1 tablet by mouth once daily 90 tablet 0  . pantoprazole (PROTONIX) 40 MG tablet Take 1 tablet (40 mg total) by mouth daily. 90 tablet 1  . Calcium Carbonate-Vitamin D 600-400 MG-UNIT tablet Take 1 tablet by  mouth 2 (two) times daily. (Patient not taking: Reported on 01/04/2020)    . fluticasone (FLONASE) 50 MCG/ACT nasal spray Place 2 sprays into both nostrils daily.     No facility-administered medications prior to  visit.    No Known Allergies  ROS Review of Systems  Constitutional: Negative.   HENT: Negative.   Respiratory: Negative.   Cardiovascular: Negative.   Gastrointestinal: Negative.  Negative for anal bleeding and blood in stool.  Endocrine: Negative for polyphagia and polyuria.  Genitourinary: Negative for difficulty urinating, frequency, hematuria and urgency.  Musculoskeletal: Positive for arthralgias. Negative for gait problem.  Neurological: Negative for light-headedness and headaches.  Hematological: Does not bruise/bleed easily.  Psychiatric/Behavioral: Negative.       Objective:    Physical Exam Vitals and nursing note reviewed.  Constitutional:      General: He is not in acute distress.    Appearance: Normal appearance. He is not ill-appearing, toxic-appearing or diaphoretic.  HENT:     Head: Normocephalic and atraumatic.     Right Ear: Tympanic membrane, ear canal and external ear normal.     Left Ear: Tympanic membrane, ear canal and external ear normal.     Mouth/Throat:     Mouth: Mucous membranes are moist.     Pharynx: Oropharynx is clear. No oropharyngeal exudate or posterior oropharyngeal erythema.  Eyes:     General: No scleral icterus.       Right eye: No discharge.        Left eye: No discharge.     Extraocular Movements: Extraocular movements intact.     Conjunctiva/sclera: Conjunctivae normal.     Pupils: Pupils are equal, round, and reactive to light.  Cardiovascular:     Rate and Rhythm: Normal rate and regular rhythm.  Pulmonary:     Effort: Pulmonary effort is normal.     Breath sounds: Normal breath sounds.  Musculoskeletal:     Cervical back: No rigidity or tenderness.     Right lower leg: Edema present.     Left lower leg: Edema  present.  Lymphadenopathy:     Cervical: No cervical adenopathy.  Neurological:     Mental Status: He is alert.  Psychiatric:        Mood and Affect: Mood normal.        Behavior: Behavior normal.     BP (!) 144/76   Pulse 75   Temp 98.6 F (37 C) (Temporal)   Ht 5\' 7"  (1.702 m)   Wt 194 lb (88 kg)   SpO2 98%   BMI 30.38 kg/m  Wt Readings from Last 3 Encounters:  01/04/20 194 lb (88 kg)  11/25/19 193 lb (87.5 kg)  11/16/19 190 lb (86.2 kg)     Health Maintenance Due  Topic Date Due  . Hepatitis C Screening  Never done  . COVID-19 Vaccine (1) Never done  . FOOT EXAM  10/27/2019  . INFLUENZA VACCINE  11/28/2019    There are no preventive care reminders to display for this patient.  Lab Results  Component Value Date   TSH 4.36 06/03/2019   Lab Results  Component Value Date   WBC 8.9 01/08/2019   HGB 7.9 (L) 01/08/2019   HCT 23.9 (L) 01/08/2019   MCV 87.9 01/08/2019   PLT 195 01/08/2019   Lab Results  Component Value Date   NA 139 09/06/2019   K 4.1 09/06/2019   CO2 24 09/06/2019   GLUCOSE 255 (H) 09/06/2019   BUN 34 (H) 09/06/2019   CREATININE 1.91 (H) 09/06/2019   BILITOT 1.4 (H) 01/01/2019   ALKPHOS 98 01/01/2019   AST 18 01/01/2019   ALT 31 01/01/2019   PROT 5.7 (L) 01/01/2019   ALBUMIN  3.2 (L) 01/01/2019   CALCIUM 8.8 09/06/2019   ANIONGAP 9 01/03/2019   GFR 36.93 (L) 09/06/2019   Lab Results  Component Value Date   CHOL 107 10/28/2018   Lab Results  Component Value Date   HDL 35.10 (L) 10/28/2018   Lab Results  Component Value Date   LDLCALC 60 10/28/2018   Lab Results  Component Value Date   TRIG 58.0 10/28/2018   Lab Results  Component Value Date   CHOLHDL 3 10/28/2018   Lab Results  Component Value Date   HGBA1C 6.0 (A) 11/16/2019      Assessment & Plan:   Problem List Items Addressed This Visit      Cardiovascular and Mediastinum   Essential hypertension - Primary   Relevant Orders   Basic metabolic panel    Urinalysis, Routine w reflex microscopic     Endocrine   Hypothyroidism   Relevant Orders   TSH     Genitourinary   Stage 3b chronic kidney disease   Relevant Orders   Basic metabolic panel     Other   Mixed hyperlipidemia   Relevant Orders   LDL cholesterol, direct   Hepatic function panel   Anemia   Relevant Orders   Iron, TIBC and Ferritin Panel   Vitamin B12   CBC   Medication side effect      No orders of the defined types were placed in this encounter.   Follow-up: No follow-ups on file.  Will hold Fosamax a few weeks and restart.  If joint pains return he will let me know.  Otherwise continue current medications.  [name], MD

## 2020-01-05 ENCOUNTER — Other Ambulatory Visit: Payer: Self-pay | Admitting: Family Medicine

## 2020-01-05 DIAGNOSIS — Z8673 Personal history of transient ischemic attack (TIA), and cerebral infarction without residual deficits: Secondary | ICD-10-CM

## 2020-01-05 LAB — IRON,TIBC AND FERRITIN PANEL
%SAT: 21 % (calc) (ref 20–48)
Ferritin: 19 ng/mL — ABNORMAL LOW (ref 38–380)
Iron: 60 ug/dL (ref 50–180)
TIBC: 282 mcg/dL (calc) (ref 250–425)

## 2020-01-10 DIAGNOSIS — E109 Type 1 diabetes mellitus without complications: Secondary | ICD-10-CM | POA: Diagnosis not present

## 2020-01-26 ENCOUNTER — Encounter: Payer: Self-pay | Admitting: Adult Health

## 2020-01-26 ENCOUNTER — Ambulatory Visit (INDEPENDENT_AMBULATORY_CARE_PROVIDER_SITE_OTHER): Payer: PPO | Admitting: Neurology

## 2020-01-26 ENCOUNTER — Encounter: Payer: Self-pay | Admitting: Neurology

## 2020-01-26 VITALS — BP 153/71 | HR 76 | Ht 67.0 in | Wt 191.0 lb

## 2020-01-26 DIAGNOSIS — G4731 Primary central sleep apnea: Secondary | ICD-10-CM | POA: Diagnosis not present

## 2020-01-26 DIAGNOSIS — G4739 Other sleep apnea: Secondary | ICD-10-CM

## 2020-01-26 NOTE — Patient Instructions (Signed)

## 2020-01-26 NOTE — Progress Notes (Addendum)
SLEEP MEDICINE CLINIC   Provider:  Larey Seat, MD   Primary Care Physician:  [name], MD   Referring Provider: [name],*    Chief Complaint  Patient presents with  . New Patient (Initial Visit)    pt alone, rm 11. presents with concerns of feeling that the machine is not working effectively. he states he often wanders if he did better when he was on the CPAP. he still continues to have events. he still hears lots of air that actually make it hard for him to sleep. he is noticing events are still elevated and wanders if pressures are where needs to be. he went on a mission trip and was unable to take machine with him, didnt feel he slept any better with it then he did without.    HPI: 01-26-2020: He was last seen by me on 07-14-2018 in a re-referral from Dr. Ethelene Hal for a new sleep apnea evaluation. In the meantime he has followed 3 times with NP.    Adrian Neal is a 54 y.o. male patient, and was meanwhile diagnosed with treatment emergent central apnea. He has been titrated to ASV after failing to respond to CPAP and BiPAP,ST. The patient underwent his first sleep study results on 09-28-2018 when he presented with an AHI of 31.4, REM AHI was 66/h supine AHI was 46/h and due to the Covid restrictions at the time a CPAP titration at the same night was not permitted.  He had to return in December 2020 and was titrated first to CPAP beginning at 5 cmH2O and advancing to 10 cmH2O the technician then changed to BiPAP at 11/7 and increase the pressure to a final pressure of 16/12 there was only a marginal reduction now his AHI was 6.7 but it was not alleviated and central apneas were the ones that still persisted.  The patient then returned January 18 this year the best result was a BiPAP pressures of 11/7 cmH2O so we started with BiPAP at 9/5 cmH2O and slowly increase pressures.  The final pressure was 17/13 centimeters with a respiratory backup rate of 12 and his  AHI was reduced to 1.3.  He slept for 19 minutes had no central apneas and no hypoxemia in that.  So this was a pressure I ordered for him to be used at home.  Currently his inspiratory pressure is 18 his expiratory pressure is 14 the backup respiratory rate is 10 and he is using it 90 to 67% of the nights with an average use at time of 7 hours 5 minutes he had a gap of almost a week in the last 30 days he was on a mission trip and could not take the CPAP with them or BiPAP.  His residual AHI is 12.9 which is still quite high. It also means a reduction in apnea by 60%- AHI is high.  (A FFM was provided, but the patient like a nasal mask better- he now uses a F30I , nasal cradle, ResMed in medium. Has air leaks(!) mask was supposed to be a nasal mask , instructions to sleep tech CGA were ignored).    Patient  reports being bothered by the noises of the machine, but this is just the baseline motor sound. He has apparently now  trouble with insomnia. Also, compliance was down as he was travelling to  [place] and didn't use the machine there-and felt not worse.   We have plugged the machine in during our  visit , and he placed the mask as he does at home, reclined , We let the BipAP run for 5 minutes - these were normal sounds, soft , constant , expected with any PAP machine.    I like to reduce the ST rate to 8 and will split the pressure to 18/13 cm.  If possible , change to auto BIPAP with 5 cm SPLIT. If the patient produces central apneas, will need to return to ASV.       07-14-2018: Consultation visit. Chief complaint according to patient : Now that I have insurance on medicare and want to see if I still need CPAP.  I have the pleasure of seeing this Caucasian right-handed married male patient again after a 3-year 25-month hiatus.  The patient has been seen in late 2016 at the time referred by Dr. Rosalin Hawking, his stroke physician, while he was on the inpatient service at [facility].  He  had also history of hypertension, hyperlipidemia, diabetes,type one, CKD, retinopathy,  and he had presented with a history of loud snoring, hypersomnia and fatigue.  His diagnosis was established and a split-night polysomnography on 27 April 2015 AHI was 34.5, REM AHI was at 72/h all sleep in supine position.  He had no periodic limb movements, no prolonged oxygen desaturation, and a stable heart rate in the 80s in normal sinus rhythm.  He was titrated to CPAP between 5 and 12 cmH2O pressure was prescribed but he was unable to obtain the machine as he lost insurance with the beginning of the following calendar year.  9 cmH2O have been his sweet spot in 2012.    Since 2016 he has not been hospitalized with any medical complications, he has carried now a diagnosis of chronic kidney disease stage II, diabetes mellitus diagnosed at age 43 type I, diabetic retinopathy, hypercholesterolemia, hypertension, and a stroke on 01-26-2015 but presented to hospital with CVA in October 2016.  Wife reports snoring and apnea have gotten worse, now in all positions. He fell asleep in church, snoring loudly !   Sleep habits are as follows: 5 Pm, and bedtime is at 9 PM. He has no difficulties falling asleep. He sleeps on his sides, using one pillow. The bedroom is cool, quiet and dark. He uses an Arboriculturist. One nocturia. Returns to sleep, dreaming some nights. Wakes by alarm by 6.05 AM - brings his 71 year old son to school.    Sleep medical history: see above - stroke, DM1 , HTN, diabetic nephropathy , retinopathy, vasculopathy.   Family sleep history: he has 4 brothers , none has apnea. Parents - mother does to have OSA, father deceased at age 88 of MI. Mother has HTN, high cholesterol. Twin brother had a stroke, DM 1 DX at age 34 (years of age),  who had pancreatic and renal transplant.    Social history: married,  1 son who is 32 years old. non smoker, non drinker, caffeine use - diet sodas with meals, 4 a  day . 1 cup of coffee on Sunday.  Review of Systems: Out of a complete 14 system review, the patient complains of only the following symptoms, and all other reviewed systems are negative.  Snoring, vision impairment, CVA residual. HTN.     Epworth score : 16/ 24 - not really refreshed from sleep.  Fatigue severity score: 48/ 63 (!) high - depression score : N/A denied.    Social History   Socioeconomic History  . Marital status:  Married    Spouse name: Not on file  . Number of children: 3  . Years of education: Not on file  . Highest education level: Not on file  Occupational History  . Occupation: Disabled  Tobacco Use  . Smoking status: Former Smoker    Packs/day: 1.00    Years: 6.00    Pack years: 6.00    Quit date: 04/30/1991    Years since quitting: 28.7  . Smokeless tobacco: Never Used  Vaping Use  . Vaping Use: Never used  Substance and Sexual Activity  . Alcohol use: No  . Drug use: Never  . Sexual activity: Not on file  Other Topics Concern  . Not on file  Social History Narrative   Right handed   Lives wife and kids in a two story home   Social Determinants of Health   Financial Resource Strain: Low Risk   . Difficulty of Paying Living Expenses: Not hard at all  Food Insecurity: No Food Insecurity  . Worried About Charity fundraiser in the Last Year: Never true  . Ran Out of Food in the Last Year: Never true  Transportation Needs: No Transportation Needs  . Lack of Transportation (Medical): No  . Lack of Transportation (Non-Medical): No  Physical Activity:   . Days of Exercise per Week: Not on file  . Minutes of Exercise per Session: Not on file  Stress:   . Feeling of Stress : Not on file  Social Connections:   . Frequency of Communication with Friends and Family: Not on file  . Frequency of Social Gatherings with Friends and Family: Not on file  . Attends Religious Services: Not on file  . Active Member of Clubs or Organizations: Not on file    . Attends Archivist Meetings: Not on file  . Marital Status: Not on file  Intimate Partner Violence:   . Fear of Current or Ex-Partner: Not on file  . Emotionally Abused: Not on file  . Physically Abused: Not on file  . Sexually Abused: Not on file    Family History  Problem Relation Age of Onset  . Hypertension Mother   . Hyperlipidemia Mother   . Hyperlipidemia Father   . Hypertension Father   . Diabetes Father   . Kidney disease Father        had a kidney transplant   . Stroke Brother   . Diabetes Brother   . Pulmonary fibrosis Paternal Grandmother   . Lung cancer Paternal Grandfather   . Diabetes Daughter   . Colon cancer Neg Hx   . Esophageal cancer Neg Hx   . Rectal cancer Neg Hx   . Stomach cancer Neg Hx     Past Medical History:  Diagnosis Date  . Cataract 1994   right eye  . Chronic kidney disease   . Diabetes mellitus without complication ([employer])    diagnosed at age 42  . Eye problems   . Heart murmur 1996  . High cholesterol    patient denies but take preventative medicine  . Hypertension   . Retinopathy due to secondary diabetes mellitus ([place])    right  . Sleep apnea    on BiPAP  . Stroke [facility]) 2016    Past Surgical History:  Procedure Laterality Date  . ANTERIOR APPROACH HEMI HIP ARTHROPLASTY Left 01/01/2019   Procedure: ANTERIOR APPROACH total hip ARTHROPLASTY;  Surgeon: Rod Can, MD;  Location: [facility];  Service: Orthopedics;  Laterality: Left;  .  CATARACT EXTRACTION Right 1994  . CATARACT EXTRACTION W/ INTRAOCULAR LENS IMPLANT  1994  . EYE SURGERY Right 1991   vitrectomy  . EYE SURGERY Right 1992   scar tissue removed from retina    Current Outpatient Medications  Medication Sig Dispense Refill  . alendronate (FOSAMAX) 70 MG tablet Take 1 tablet (70 mg total) by mouth every 7 (seven) days. Take with a full glass of water on an empty stomach. 4 tablet 11  . amLODipine (NORVASC) 10 MG tablet Take 0.5 tablets (5 mg total) by  mouth daily. 90 tablet 1  . aspirin EC 81 MG tablet Take 1 tablet (81 mg total) by mouth daily. 365 tablet 1  . atorvastatin (LIPITOR) 40 MG tablet TAKE 1 TABLET BY MOUTH ONCE DAILY AT  6PM  **PLEASE  KEEP  UPCOMING  APPT** 90 tablet 0  . calcium carbonate (OSCAL) 1500 (600 Ca) MG TABS tablet Take 1 tablet (1,500 mg total) by mouth 2 (two) times daily with a meal. 180 tablet 5  . Calcium Carbonate-Vitamin D 600-400 MG-UNIT tablet Take 1 tablet by mouth 2 (two) times daily.     . carvedilol (COREG) 12.5 MG tablet TAKE 1 TABLET BY MOUTH TWICE DAILY WITH A MEAL 60 tablet 0  . cloNIDine (CATAPRES) 0.2 MG tablet Take 1 tablet (0.2 mg total) by mouth 2 (two) times daily. 180 tablet 1  . clopidogrel (PLAVIX) 75 MG tablet TAKE 1 TABLET BY MOUTH ONCE DAILY -ASPIRIN  AND  PLAVIX  FOR  3  MONTHS,  AFTER  THAT-  STOP  ASPIRIN-  TAKE  PLAVIX  ALONE 90 tablet 3  . Continuous Blood Gluc Receiver (DEXCOM G6 RECEIVER) DEVI 1 Device by Does not apply route as directed. 1 Device 0  . Continuous Blood Gluc Sensor (DEXCOM G6 SENSOR) MISC 3 Devices by Does not apply route as directed. 3 each 6  . Continuous Blood Gluc Transmit (DEXCOM G6 TRANSMITTER) MISC 1 Device by Does not apply route as directed. 1 each 6  . fluticasone (FLONASE) 50 MCG/ACT nasal spray Place 2 sprays into both nostrils daily.    . furosemide (LASIX) 20 MG tablet Take 1 tablet (20 mg total) by mouth daily. 90 tablet 3  . Insulin Infusion Pump (T:SLIM INSULIN PUMP) DEVI by Does not apply route.    . insulin lispro (HUMALOG) 100 UNIT/ML injection Max daily dose of 100 units via pump DX E10.59 (vials) 90 mL 4  . lisinopril (ZESTRIL) 20 MG tablet Take 1 tablet by mouth once daily 90 tablet 0  . pantoprazole (PROTONIX) 40 MG tablet Take 1 tablet (40 mg total) by mouth daily. 90 tablet 1   No current facility-administered medications for this visit.    Allergies as of 01/26/2020  . (No Known Allergies)    Vitals: BP (!) 153/71   Pulse 76   Ht 5'  7" (1.702 m)   Wt 191 lb (86.6 kg)   BMI 29.91 kg/m  Last Weight:  Wt Readings from Last 1 Encounters:  01/26/20 191 lb (86.6 kg)   IHW:TUUE mass index is 29.91 kg/m.     Last Height:   Ht Readings from Last 1 Encounters:  01/26/20 5\' 7"  (1.702 m)    Physical exam:  General: The patient is awake, alert and appears not in acute distress. The patient is well groomed.  Head: Normocephalic, atraumatic. Neck is supple. Mallampati: 5  neck circumference:18". Nasal airflow patent,   Retrognathia is not seen.  Cardiovascular:  Regular  rate and rhythm , without  murmurs or carotid bruit, and without distended neck veins. Respiratory: Lungs are clear to auscultation. Skin:  With edema at ankle . Trunk: BMI is 30. The patient's posture is erect.  Neurologic exam : The patient is awake and alert, oriented to place and time.  Attention span & concentration ability appears normal.  Speech is fluent,  without  dysarthria, dysphonia or aphasia.  Mood and affect are appropriate.  Cranial nerves: Pupils are equal and briskly reactive to light.  Extraocular movements  in vertical and horizontal planes intact and without nystagmus. Visual fields by finger perimetry are intact. Hearing to finger rub intact.   Facial sensation intact to fine touch.  Facial motor strength is symmetric and tongue and uvula move midline. Shoulder shrug was symmetrical.   Motor exam:Normal tone, muscle bulk and symmetric strength in all extremities.  Sensory:  Fine touch, pinprick and vibration were tested in all extremities. Proprioception tested in the upper extremities was normal. No pronation, but patient reports decreased sensation to vibration.  Coordination: Rapid alternating movements in the fingers/hands was normal. Finger-to-nose maneuver normal without evidence of ataxia, dysmetria or tremor.  Gait and station: Patient walks without assistive device - Strength within normal limits. Stance is stable and  normal.  Deep tendon reflexes: in the  upper and lower extremities are symmetric and intact.    Assessment:  After physical and neurologic examination, review of laboratory studies,  Personal review of imaging studies, reports of other /same  Imaging studies, results of polysomnography and / or neurophysiology testing and pre-existing records as far as provided in visit., my assessment is:  Mr. Baratta is a meanwhile 54year old patient with longstanding DM 1, insulin dependent for over 45 years. He has poor dentition, has CKD.  He has described vision changes due to diabetic retinopathy, 1990 he had an eye bleed.  He developed mild sensory neuropathy and diabetic nephropathy.   He suffered a stroke in 2016 and lost insurance right after OSA was diagnosed.   2) No pulmonary dysfunction, tachypnoea and no asthma history.   He is using a BiPAP at 18-14 cm water. St 10, has high residual AHI- 12.9/h. moderate - severe air leaks- medium size ResMed F 30 I, PLAN :  I will change the settings for 30 days to auto BiPAP , 5 cm split.   The patient was advised of the nature of the diagnosed disorder , the treatment options and the  risks for general health and wellness arising from not treating the condition.   I spent more than 21 minutes of face to face time with the patient.  Greater than 50% of time was spent in counseling and coordination of care. We have discussed the diagnosis and differential and I answered the patient's questions.    Plan:  Treatment plan and additional workup : PLAN :  I will change the settings for 30 days to auto BiPAP , 5 cm split.   follow up by phone or virtual with Np for 15 minutes after 30 days, if AHI still over 10 , will invite for retesting.    Larey Seat, MD 9/44/9675, 9:16 PM  Certified in Neurology by ABPN Certified in [place] by [facility] [address] [place], [place] [address]

## 2020-01-27 ENCOUNTER — Other Ambulatory Visit: Payer: Self-pay | Admitting: Family Medicine

## 2020-01-27 DIAGNOSIS — I10 Essential (primary) hypertension: Secondary | ICD-10-CM

## 2020-02-07 ENCOUNTER — Ambulatory Visit: Payer: PPO | Admitting: Adult Health

## 2020-02-09 DIAGNOSIS — E109 Type 1 diabetes mellitus without complications: Secondary | ICD-10-CM | POA: Diagnosis not present

## 2020-02-18 DIAGNOSIS — G4737 Central sleep apnea in conditions classified elsewhere: Secondary | ICD-10-CM | POA: Diagnosis not present

## 2020-02-22 ENCOUNTER — Encounter: Payer: Self-pay | Admitting: Neurology

## 2020-02-22 ENCOUNTER — Telehealth: Payer: Self-pay

## 2020-02-22 NOTE — Telephone Encounter (Signed)
I called the patient back to touch base with him. [employer] is going to provide him with a loaner while they are resetting his personal machine. He plans to try the therapy a little longer.

## 2020-02-22 NOTE — Progress Notes (Signed)
Chronic Care Management Pharmacy Assistant   Name: Adrian Neal  MRN: [id number] DOB: 01-Dec-1965  Reason for Encounter: Hypertension Disease State Call.  PCP : [name], MD  Allergies:  No Known Allergies  Medications: Outpatient Encounter Medications as of 02/22/2020  Medication Sig  . alendronate (FOSAMAX) 70 MG tablet Take 1 tablet (70 mg total) by mouth every 7 (seven) days. Take with a full glass of water on an empty stomach.  Marland Kitchen amLODipine (NORVASC) 10 MG tablet Take 0.5 tablets (5 mg total) by mouth daily.  Marland Kitchen aspirin EC 81 MG tablet Take 1 tablet (81 mg total) by mouth daily.  Marland Kitchen atorvastatin (LIPITOR) 40 MG tablet TAKE 1 TABLET BY MOUTH ONCE DAILY AT  6PM  **PLEASE  KEEP  UPCOMING  APPT**  . calcium carbonate (OSCAL) 1500 (600 Ca) MG TABS tablet Take 1 tablet (1,500 mg total) by mouth 2 (two) times daily with a meal.  . Calcium Carbonate-Vitamin D 600-400 MG-UNIT tablet Take 1 tablet by mouth 2 (two) times daily.   . carvedilol (COREG) 12.5 MG tablet TAKE 1 TABLET BY MOUTH TWICE DAILY WITH A MEAL  . cloNIDine (CATAPRES) 0.2 MG tablet Take 1 tablet (0.2 mg total) by mouth 2 (two) times daily.  . clopidogrel (PLAVIX) 75 MG tablet TAKE 1 TABLET BY MOUTH ONCE DAILY -ASPIRIN  AND  PLAVIX  FOR  3  MONTHS,  AFTER  THAT-  STOP  ASPIRIN-  TAKE  PLAVIX  ALONE  . Continuous Blood Gluc Receiver (DEXCOM G6 RECEIVER) DEVI 1 Device by Does not apply route as directed.  . Continuous Blood Gluc Sensor (DEXCOM G6 SENSOR) MISC 3 Devices by Does not apply route as directed.  . Continuous Blood Gluc Transmit (DEXCOM G6 TRANSMITTER) MISC 1 Device by Does not apply route as directed.  . fluticasone (FLONASE) 50 MCG/ACT nasal spray Place 2 sprays into both nostrils daily.  . furosemide (LASIX) 20 MG tablet Take 1 tablet (20 mg total) by mouth daily.  . Insulin Infusion Pump (T:SLIM INSULIN PUMP) DEVI by Does not apply route.  . insulin lispro (HUMALOG) 100 UNIT/ML injection Max  daily dose of 100 units via pump DX E10.59 (vials)  . lisinopril (ZESTRIL) 20 MG tablet Take 1 tablet by mouth once daily  . pantoprazole (PROTONIX) 40 MG tablet Take 1 tablet by mouth once daily   No facility-administered encounter medications on file as of 02/22/2020.    Current Diagnosis: Patient Active Problem List   Diagnosis Date Noted  . Anemia 01/04/2020  . Medication side effect 01/04/2020  . Osteopenia 10/04/2019  . Patellofemoral pain syndrome of both knees 06/08/2019  . Pain in both knees 06/03/2019  . Excessive daytime sleepiness 04/12/2019  . 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 Cerebral thrombosis with cerebral infarction ([place]) 04/12/2019  . Seasonal allergic rhinitis 03/11/2019  . Need for pneumococcal vaccination 03/11/2019  . AKI (acute kidney injury) ([place]) 01/08/2019  . Acute blood loss anemia 01/08/2019  . History of femur fracture 01/08/2019  . Need for influenza vaccination 01/08/2019  . Edema of both legs 01/08/2019  . Hospital discharge follow-up 01/08/2019  . Closed left femoral fracture ([place]) 12/31/2018  . Diabetes mellitus type I ([place]) 12/17/2018  . Type 1 diabetes mellitus with stage 3 chronic kidney disease ([place]) 08/12/2018  . 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 ([place]) 08/12/2018  . Type 1 diabetes mellitus with hyperglycemia ([place]) 08/12/2018  . Vitamin D deficiency 07/15/2018  . Stage  3b chronic kidney disease ([place]) 06/16/2018  . Healthcare maintenance 06/15/2018  . History of CVA (cerebrovascular accident) 06/01/2018  . OSA (obstructive sleep apnea) 03/29/2015  . Stroke ([place])   . Hypothyroidism 01/19/2015  . Mixed hyperlipidemia 11/22/2014  . Ataxia   . CVA (cerebral infarction) 08/03/2014  . Insulin dependent diabetes mellitus   . Intractable nausea and vomiting 07/27/2014  . Vertigo 07/27/2014  . Essential hypertension 07/27/2014  . Type 1 diabetes mellitus ([place]) 05/29/2010  . Hyperlipidemia 05/29/2010  . PLANTAR  FASCIITIS 05/29/2010    Follow-Up:  Pharmacist Review   Reviewed chart prior to disease state call. Spoke with patient regarding BP  Recent Office Vitals: BP Readings from Last 3 Encounters:  01/26/20 (!) 153/71  01/04/20 (!) 144/76  11/16/19 132/62   Pulse Readings from Last 3 Encounters:  01/26/20 76  01/04/20 75  11/16/19 62    Wt Readings from Last 3 Encounters:  01/26/20 191 lb (86.6 kg)  01/04/20 194 lb (88 kg)  11/25/19 193 lb (87.5 kg)     Kidney Function Lab Results  Component Value Date/Time   CREATININE 1.89 (H) 01/04/2020 01:39 PM   CREATININE 1.91 (H) 09/06/2019 04:58 PM   CREATININE 2.07 (H) 01/08/2019 04:38 PM   GFR 37.34 (L) 01/04/2020 01:39 PM   GFRNONAA 31 (L) 01/03/2019 07:28 AM   GFRAA 35 (L) 01/03/2019 07:28 AM    BMP Latest Ref Rng & Units 01/04/2020 09/06/2019 06/03/2019  Glucose 70 - 99 mg/dL 232(H) 255(H) 146(H)  BUN 6 - 23 mg/dL 30(H) 34(H) 31(H)  Creatinine 0.40 - 1.50 mg/dL 1.89(H) 1.91(H) 1.98(H)  BUN/Creat Ratio 6 - 22 (calc) - - -  Sodium 135 - 145 mEq/L 137 139 139  Potassium 3.5 - 5.1 mEq/L 4.2 4.1 3.6  Chloride 96 - 112 mEq/L 104 105 106  CO2 19 - 32 mEq/L 26 24 24   Calcium 8.4 - 10.5 mg/dL 8.6 8.8 8.9    . Current antihypertensive regimen:  ? Amlodipine 5 mg daily  ? Carvedilol 12.5 mg twice daily  ? Clonidine 0.2 mg twice daily  ? Furosemide 20 mg daily  ? Lisinopril 20 mg daily    . Patient states he has not taken Furosemide due to the pharmacy will not fill it.Patient states he has not taken Furosemide for a few days.Patient states he would like a refill if he needs to take it.  . How often are you checking your Blood Pressure? infrequently . Current home BP readings:  o Patient states his blood pressure ranging around 140's/72~78. . What recent interventions/DTPs have been made by any provider to improve Blood Pressure control since last CPP Visit:  o None ID . Any recent hospitalizations or ED visits since last visit with  CPP? No . What diet changes have been made to improve Blood Pressure Control?  o Patient states he cooks at home without using salt.Patient states he may eat out once in awhile . o Eggs+chesse+vegatbles+meats+Crackers  . What exercise is being done to improve your Blood Pressure Control?  o Patient reports he goes to [facility] 3 to 4 times a week and walks when he does not got to [facility].  Patient reports dizziness ,but he thinks it is related to vertigo.Patient states he takes meclizine to help with relief.    Adherence Review: Is the patient currently on ACE/ARB medication? Yes Does the patient have >5 day gap between last estimated fill dates? Yes   [place] Pharmacist  Assistant [phone]

## 2020-02-28 ENCOUNTER — Telehealth: Payer: Self-pay

## 2020-02-28 NOTE — Telephone Encounter (Signed)
Pt has a my chart video scheduled for tomorrow 02/29/20 with Jinny Blossom.  Pt was suppose to get a loaner cpap but with the cpap shortage it was unclear if he received it.  If pt received a loaner: How long has he been using the cpap? If under 30 days pt will have to reschedule his VV unless he has new concerns or questions  ---  If pt calls back xfer the call to [place], [place]

## 2020-02-29 ENCOUNTER — Telehealth (INDEPENDENT_AMBULATORY_CARE_PROVIDER_SITE_OTHER): Payer: PPO | Admitting: Adult Health

## 2020-02-29 DIAGNOSIS — G4733 Obstructive sleep apnea (adult) (pediatric): Secondary | ICD-10-CM

## 2020-02-29 NOTE — Progress Notes (Signed)
PATIENT: Adrian Neal DOB: 10-01-1965  REASON FOR VISIT: follow up HISTORY FROM: patient  Virtual Visit via Video Note  I connected with [name] on 02/29/20 at 10:00 AM EDT by a video enabled telemedicine application located remotely at [facility] and verified that I am speaking with the correct person using two identifiers who was located at their own home.   I discussed the limitations of evaluation and management by telemedicine and the availability of in person appointments. The patient expressed understanding and agreed to proceed.   PATIENT: Adrian Neal DOB: [birth year]  REASON FOR VISIT: follow up HISTORY FROM: patient  HISTORY OF PRESENT ILLNESS: Today 02/29/20:  The patient has not currently been using his BiPAP consistently however he has been reaching out and discussing this with our office.  He is currently getting a loaner machine while his BiPAP settings are being adjusted.  The patient will receive this this afternoon.  Advised patient that since we do not have any information from a download we will postpone this visit till after he has used the machine 1 to 2 months.  He voiced understanding.  REVIEW OF SYSTEMS: Out of a complete 14 system review of symptoms, the patient complains only of the following symptoms, and all other reviewed systems are negative.  ALLERGIES: No Known Allergies  HOME MEDICATIONS: Outpatient Medications Prior to Visit  Medication Sig Dispense Refill  . alendronate (FOSAMAX) 70 MG tablet Take 1 tablet (70 mg total) by mouth every 7 (seven) days. Take with a full glass of water on an empty stomach. 4 tablet 11  . amLODipine (NORVASC) 10 MG tablet Take 0.5 tablets (5 mg total) by mouth daily. 90 tablet 1  . aspirin EC 81 MG tablet Take 1 tablet (81 mg total) by mouth daily. 365 tablet 1  . atorvastatin (LIPITOR) 40 MG tablet TAKE 1 TABLET BY MOUTH ONCE DAILY AT  6PM  **PLEASE  KEEP  UPCOMING   APPT** 90 tablet 0  . calcium carbonate (OSCAL) 1500 (600 Ca) MG TABS tablet Take 1 tablet (1,500 mg total) by mouth 2 (two) times daily with a meal. 180 tablet 5  . Calcium Carbonate-Vitamin D 600-400 MG-UNIT tablet Take 1 tablet by mouth 2 (two) times daily.     . carvedilol (COREG) 12.5 MG tablet TAKE 1 TABLET BY MOUTH TWICE DAILY WITH A MEAL 60 tablet 5  . cloNIDine (CATAPRES) 0.2 MG tablet Take 1 tablet (0.2 mg total) by mouth 2 (two) times daily. 180 tablet 1  . clopidogrel (PLAVIX) 75 MG tablet TAKE 1 TABLET BY MOUTH ONCE DAILY -ASPIRIN  AND  PLAVIX  FOR  3  MONTHS,  AFTER  THAT-  STOP  ASPIRIN-  TAKE  PLAVIX  ALONE 90 tablet 3  . Continuous Blood Gluc Receiver (DEXCOM G6 RECEIVER) DEVI 1 Device by Does not apply route as directed. 1 Device 0  . Continuous Blood Gluc Sensor (DEXCOM G6 SENSOR) MISC 3 Devices by Does not apply route as directed. 3 each 6  . Continuous Blood Gluc Transmit (DEXCOM G6 TRANSMITTER) MISC 1 Device by Does not apply route as directed. 1 each 6  . fluticasone (FLONASE) 50 MCG/ACT nasal spray Place 2 sprays into both nostrils daily.    . furosemide (LASIX) 20 MG tablet Take 1 tablet (20 mg total) by mouth daily. 90 tablet 3  . Insulin Infusion Pump (T:SLIM INSULIN PUMP) DEVI by Does not apply route.    . insulin  lispro (HUMALOG) 100 UNIT/ML injection Max daily dose of 100 units via pump DX E10.59 (vials) 90 mL 4  . lisinopril (ZESTRIL) 20 MG tablet Take 1 tablet by mouth once daily 90 tablet 0  . pantoprazole (PROTONIX) 40 MG tablet Take 1 tablet by mouth once daily 90 tablet 1   No facility-administered medications prior to visit.    PAST MEDICAL HISTORY: Past Medical History:  Diagnosis Date  . Cataract 1994   right eye  . Chronic kidney disease   . Diabetes mellitus without complication ([place])    diagnosed at age 46  . Eye problems   . Heart murmur 1996  . High cholesterol    patient denies but take preventative medicine  . Hypertension   . Retinopathy  due to secondary diabetes mellitus ([place])    right  . Sleep apnea    on BiPAP  . Stroke [facility]) 2016    PAST SURGICAL HISTORY: Past Surgical History:  Procedure Laterality Date  . ANTERIOR APPROACH HEMI HIP ARTHROPLASTY Left 01/01/2019   Procedure: ANTERIOR APPROACH total hip ARTHROPLASTY;  Surgeon: Rod Can, MD;  Location: [place];  Service: Orthopedics;  Laterality: Left;  . CATARACT EXTRACTION Right 1994  . CATARACT EXTRACTION W/ INTRAOCULAR LENS IMPLANT  1994  . EYE SURGERY Right 1991   vitrectomy  . EYE SURGERY Right 1992   scar tissue removed from retina    FAMILY HISTORY: Family History  Problem Relation Age of Onset  . Hypertension Mother   . Hyperlipidemia Mother   . Hyperlipidemia Father   . Hypertension Father   . Diabetes Father   . Kidney disease Father        had a kidney transplant   . Stroke Brother   . Diabetes Brother   . Pulmonary fibrosis Paternal Grandmother   . Lung cancer Paternal Grandfather   . Diabetes Daughter   . Colon cancer Neg Hx   . Esophageal cancer Neg Hx   . Rectal cancer Neg Hx   . Stomach cancer Neg Hx     SOCIAL HISTORY: Social History   Socioeconomic History  . Marital status: Married    Spouse name: Not on file  . Number of children: 3  . Years of education: Not on file  . Highest education level: Not on file  Occupational History  . Occupation: Disabled  Tobacco Use  . Smoking status: Former Smoker    Packs/day: 1.00    Years: 6.00    Pack years: 6.00    Quit date: 04/30/1991    Years since quitting: 28.8  . Smokeless tobacco: Never Used  Vaping Use  . Vaping Use: Never used  Substance and Sexual Activity  . Alcohol use: No  . Drug use: Never  . Sexual activity: Not on file  Other Topics Concern  . Not on file  Social History Narrative   Right handed   Lives wife and kids in a two story home   Social Determinants of Health   Financial Resource Strain: Low Risk   . Difficulty of Paying Living Expenses:  Not hard at all  Food Insecurity: No Food Insecurity  . Worried About Charity fundraiser in the Last Year: Never true  . Ran Out of Food in the Last Year: Never true  Transportation Needs: No Transportation Needs  . Lack of Transportation (Medical): No  . Lack of Transportation (Non-Medical): No  Physical Activity:   . Days of Exercise per Week: Not on file  .  Minutes of Exercise per Session: Not on file  Stress:   . Feeling of Stress : Not on file  Social Connections:   . Frequency of Communication with Friends and Family: Not on file  . Frequency of Social Gatherings with Friends and Family: Not on file  . Attends Religious Services: Not on file  . Active Member of Clubs or Organizations: Not on file  . Attends Archivist Meetings: Not on file  . Marital Status: Not on file  Intimate Partner Violence:   . Fear of Current or Ex-Partner: Not on file  . Emotionally Abused: Not on file  . Physically Abused: Not on file  . Sexually Abused: Not on file      PHYSICAL EXAM Generalized: Well developed, in no acute distress   Neurological examination  Mentation: Alert oriented to time, place, history taking. Follows all commands speech and language fluent   DIAGNOSTIC DATA (LABS, IMAGING, TESTING) - I reviewed patient records, labs, notes, testing and imaging myself where available.  Lab Results  Component Value Date   WBC 5.1 01/04/2020   HGB 11.9 (L) 01/04/2020   HCT 34.5 (L) 01/04/2020   MCV 87.4 01/04/2020   PLT 110.0 (L) 01/04/2020      Component Value Date/Time   NA 137 01/04/2020 1339   K 4.2 01/04/2020 1339   CL 104 01/04/2020 1339   CO2 26 01/04/2020 1339   GLUCOSE 232 (H) 01/04/2020 1339   BUN 30 (H) 01/04/2020 1339   CREATININE 1.89 (H) 01/04/2020 1339   CREATININE 2.07 (H) 01/08/2019 1638   CALCIUM 8.6 01/04/2020 1339   PROT 6.0 01/04/2020 1339   ALBUMIN 3.8 01/04/2020 1339   AST 17 01/04/2020 1339   ALT 18 01/04/2020 1339   ALKPHOS 81  01/04/2020 1339   BILITOT 0.7 01/04/2020 1339   GFRNONAA 31 (L) 01/03/2019 0728   GFRAA 35 (L) 01/03/2019 0728   Lab Results  Component Value Date   CHOL 107 10/28/2018   HDL 35.10 (L) 10/28/2018   LDLCALC 60 10/28/2018   LDLDIRECT 57.0 01/04/2020   TRIG 58.0 10/28/2018   CHOLHDL 3 10/28/2018   Lab Results  Component Value Date   HGBA1C 6.0 (A) 11/16/2019   Lab Results  Component Value Date   VITAMINB12 371 01/04/2020   Lab Results  Component Value Date   TSH 3.18 01/04/2020      ASSESSMENT AND PLAN 54 y.o. year old male  has a past medical history of Cataract (1994), Chronic kidney disease, Diabetes mellitus without complication ([place]), Eye problems, Heart murmur (1996), High cholesterol, Hypertension, Retinopathy due to secondary diabetes mellitus ([place]), Sleep apnea, and Stroke ([place]) (2016). here with:  OSA on BiPAP  . Encouraged him to use the BiPAP once his settings are adjusted in 1 to 2 months if he is tolerating it well we will look at a download.  He will call our office.  I spent 20 minutes of face-to-face and non-face-to-face time with patient.  This included previsit chart review, lab review, study review, order entry, electronic health record documentation, patient education.  Ward Givens, MSN, NP-C 02/29/2020, 10:10 AM [facility] [address] [place], [place] [address] [phone]

## 2020-03-10 DIAGNOSIS — E109 Type 1 diabetes mellitus without complications: Secondary | ICD-10-CM | POA: Diagnosis not present

## 2020-03-15 ENCOUNTER — Other Ambulatory Visit: Payer: Self-pay

## 2020-03-17 ENCOUNTER — Encounter: Payer: Self-pay | Admitting: Neurology

## 2020-03-20 DIAGNOSIS — G4737 Central sleep apnea in conditions classified elsewhere: Secondary | ICD-10-CM | POA: Diagnosis not present

## 2020-03-27 ENCOUNTER — Encounter: Payer: Self-pay | Admitting: Family Medicine

## 2020-03-28 ENCOUNTER — Telehealth: Payer: Self-pay | Admitting: Family Medicine

## 2020-03-28 NOTE — Telephone Encounter (Signed)
Patient dropped of DMV form to be completed by his provider. Placed in provider's folder in front office. Please call him at [phone] when it's ready to be picked up.

## 2020-03-30 ENCOUNTER — Telehealth: Payer: Self-pay | Admitting: Adult Health

## 2020-03-30 NOTE — Telephone Encounter (Signed)
I called and spoke to Adrian Neal at [phone] in [place], she stated did not need appt just to go by and drop off the machine.  I left VM for pt re: this and gave the address.  He is to call back if questions.  (per Deneise Lever in phone rm,  He has not been able to reach [employer].

## 2020-03-30 NOTE — Telephone Encounter (Signed)
Pt called, have been trying to contact [employer] to turn in CPAP machine, with success. Would like a call from the nurse.

## 2020-03-31 ENCOUNTER — Telehealth: Payer: Self-pay

## 2020-03-31 NOTE — Telephone Encounter (Signed)
Patient notified VIA phone and placed up front for pick up.  Dm/cma

## 2020-03-31 NOTE — Progress Notes (Addendum)
Chronic Care Management Pharmacy Assistant   Name: AUGUSTA HILBERT  MRN: [id number] DOB: 1965/07/26  Reason for Encounter:Diabetes  Disease State Call.  PCP : [name], MD  Allergies:  No Known Allergies  Medications: Outpatient Encounter Medications as of 03/31/2020  Medication Sig   alendronate (FOSAMAX) 70 MG tablet Take 1 tablet (70 mg total) by mouth every 7 (seven) days. Take with a full glass of water on an empty stomach.   amLODipine (NORVASC) 10 MG tablet Take 0.5 tablets (5 mg total) by mouth daily.   aspirin EC 81 MG tablet Take 1 tablet (81 mg total) by mouth daily.   atorvastatin (LIPITOR) 40 MG tablet TAKE 1 TABLET BY MOUTH ONCE DAILY AT  6PM  **PLEASE  KEEP  UPCOMING  APPT**   calcium carbonate (OSCAL) 1500 (600 Ca) MG TABS tablet Take 1 tablet (1,500 mg total) by mouth 2 (two) times daily with a meal.   Calcium Carbonate-Vitamin D 600-400 MG-UNIT tablet Take 1 tablet by mouth 2 (two) times daily.    carvedilol (COREG) 12.5 MG tablet TAKE 1 TABLET BY MOUTH TWICE DAILY WITH A MEAL   cloNIDine (CATAPRES) 0.2 MG tablet Take 1 tablet (0.2 mg total) by mouth 2 (two) times daily.   clopidogrel (PLAVIX) 75 MG tablet TAKE 1 TABLET BY MOUTH ONCE DAILY -ASPIRIN  AND  PLAVIX  FOR  3  MONTHS,  AFTER  THAT-  STOP  ASPIRIN-  TAKE  PLAVIX  ALONE   Continuous Blood Gluc Receiver (DEXCOM G6 RECEIVER) DEVI 1 Device by Does not apply route as directed.   Continuous Blood Gluc Sensor (DEXCOM G6 SENSOR) MISC 3 Devices by Does not apply route as directed.   Continuous Blood Gluc Transmit (DEXCOM G6 TRANSMITTER) MISC 1 Device by Does not apply route as directed.   fluticasone (FLONASE) 50 MCG/ACT nasal spray Place 2 sprays into both nostrils daily.   furosemide (LASIX) 20 MG tablet Take 1 tablet (20 mg total) by mouth daily.   Insulin Infusion Pump (T:SLIM INSULIN PUMP) DEVI by Does not apply route.   insulin lispro (HUMALOG) 100 UNIT/ML injection Max daily dose of 100 units  via pump DX E10.59 (vials)   lisinopril (ZESTRIL) 20 MG tablet Take 1 tablet by mouth once daily   pantoprazole (PROTONIX) 40 MG tablet Take 1 tablet by mouth once daily   No facility-administered encounter medications on file as of 03/31/2020.    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[place]) 04/12/2019   Seasonal allergic rhinitis 03/11/2019   Need for pneumococcal vaccination 03/11/2019   AKI (acute kidney injury) ([place]) 01/08/2019   Acute blood loss anemia 01/08/2019   History of femur fracture 01/08/2019   Need for influenza vaccination 01/08/2019   Edema of both legs 01/08/2019   Hospital discharge follow-up 01/08/2019   Closed left femoral fracture ([place]) 12/31/2018   Diabetes mellitus type I ([place]) 12/17/2018   Type 1 diabetes mellitus with stage 3 chronic kidney disease ([place]) 08/12/2018   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 ([place]) 08/12/2018   Type 1 diabetes mellitus with hyperglycemia ([place]) 08/12/2018   Vitamin D deficiency 07/15/2018   Stage  3b chronic kidney disease ([place]) 06/16/2018   Healthcare maintenance 06/15/2018   History of CVA (cerebrovascular accident) 06/01/2018   OSA (obstructive sleep apnea) 03/29/2015   Stroke ([place])    Hypothyroidism 01/19/2015   Mixed hyperlipidemia 11/22/2014   Ataxia    CVA (cerebral infarction) 08/03/2014   Insulin dependent diabetes mellitus    Intractable nausea and vomiting 07/27/2014   Vertigo 07/27/2014   Essential hypertension 07/27/2014   Type 1 diabetes mellitus ([place]) 05/29/2010   Hyperlipidemia 05/29/2010   PLANTAR FASCIITIS 05/29/2010    Follow-Up:  Pharmacist Review    Recent Relevant Labs: Lab Results  Component Value Date/Time   HGBA1C 6.0 (A) 11/16/2019 01:38 PM   HGBA1C 6.8 (A) 07/13/2019 03:48 PM   HGBA1C 7.2 (H) 01/01/2019 04:34 AM   HGBA1C 7.7 (H) 01/20/2015 05:17 AM   MICROALBUR 28.4 (H) 10/28/2018 08:39 AM   MICROALBUR 71.1 (H) 06/15/2018 08:51 AM    Kidney Function Lab Results  Component Value Date/Time   CREATININE 1.89 (H) 01/04/2020 01:39 PM   CREATININE 1.91 (H) 09/06/2019 04:58 PM   CREATININE 2.07 (H) 01/08/2019 04:38 PM   GFR 37.34 (L) 01/04/2020 01:39 PM   GFRNONAA 31 (L) 01/03/2019 07:28 AM   GFRAA 35 (L) 01/03/2019 07:28 AM    Current antihyperglycemic regimen:  Humalog What recent interventions/DTPs have been made to improve glycemic control:  None ID Have there been any recent hospitalizations or ED visits since last visit with CPP? No Patient reports hypoglycemic symptoms, including Sweaty, Shaky and Vision changes  Patient states he will eat or drink something sweet to help with relief.  Patient reports hyperglycemic symptoms, including blurry vision and excessive thirst  Patient states he will take a walk or try to move around to help with relief.  How often are you checking your blood sugar? once daily What are your blood sugars ranging?  Fasting: N/A Before meals: N/A After meals: N/A Bedtime: N/A Patient states he has a app that his provider is able to see his reading through his chart. On 03/31/2020 it was 168 at 3.20pm. During the week, how often does your blood glucose drop below 70?  Patient reports his sugars drop below 70 several times a week but nothing below 60. On 03/31/2020 it was 66. Are you checking your feet daily/regularly?   Patient report his feet swells, but denies pain, numbness and tingling sensation in his feet.  Adherence Review: Is the patient currently on a STATIN medication? Yes Is the patient currently on ACE/ARB medication? Yes Does the patient have >5 day gap between last  estimated fill dates? Yes   [place] Pharmacist Assistant [phone]   Maryjean Ka  Addendum 04/03/20: Patient with multiple reported hypoglycemic events. Unable to see Dexcom results as patient has not shared permissions with clinical pharmacist yet. Endocrinologist informed of hypogylcemic events. Patient has follow-up with endocrinology in Jan. and follow-up with myself in Feb.  [name] Fleury,PharmD Clinical Pharmacist [facility]  [phone]

## 2020-03-31 NOTE — Telephone Encounter (Signed)
Pt called back and I informed him that his message was sent back to the nurse.

## 2020-03-31 NOTE — Telephone Encounter (Signed)
Patient needs a handicapped placard filled out. placed form on your desk.   Dm/cma

## 2020-03-31 NOTE — Telephone Encounter (Signed)
Done and returned to your desk

## 2020-03-31 NOTE — Telephone Encounter (Signed)
Patient is calling to check the status of his paperwork. Please give him a call back and advise.

## 2020-04-03 ENCOUNTER — Encounter: Payer: Self-pay | Admitting: Internal Medicine

## 2020-04-03 NOTE — Telephone Encounter (Signed)
Hello,   Thank you for reaching out. I took a look at his dexcom download. His hypoglycemic episodes occur during the day after bolus, there's no specific patten to them. He has no hypoglycemia during the hight.  Most likely this is due to insulin-to carb ratio or variable change in activity level.    No changes at this time as no pattern but will wait until his visit and see if the pump download will provide more info.    IN the meantime pt will need to follow rule of 15 for hypoglycemia ( has has that info) and try and be as accurate as possible with counting carbs.  If the pt needs to be sooner, will be happy to schedule him    Thanks again   Mack Guise, MD  [facility]  [facility] [address] [place], [place] [address] Phone: [phone] FAX: [phone]

## 2020-04-03 NOTE — Telephone Encounter (Signed)
Placed in Dr [facility] inbox for review

## 2020-04-03 NOTE — Telephone Encounter (Signed)
Can you please download his Dexcom ?  Thanks

## 2020-04-04 DIAGNOSIS — G4737 Central sleep apnea in conditions classified elsewhere: Secondary | ICD-10-CM | POA: Diagnosis not present

## 2020-04-06 NOTE — Telephone Encounter (Signed)
Form was found on Monday and given to patient.  Dm/cma

## 2020-04-06 NOTE — Telephone Encounter (Signed)
Will you follow through with this and check with Adrian Neal? Patient came in on Monday to pick up form but we could not find it.

## 2020-04-08 ENCOUNTER — Emergency Department (HOSPITAL_COMMUNITY)
Admission: EM | Admit: 2020-04-08 | Discharge: 2020-04-08 | Disposition: A | Discharge status: Home / Self Care | Payer: PPO | Attending: Emergency Medicine | Admitting: Emergency Medicine

## 2020-04-08 ENCOUNTER — Emergency Department (HOSPITAL_COMMUNITY): Payer: PPO

## 2020-04-08 ENCOUNTER — Encounter (HOSPITAL_COMMUNITY): Payer: Self-pay | Admitting: Emergency Medicine

## 2020-04-08 ENCOUNTER — Other Ambulatory Visit: Payer: Self-pay

## 2020-04-08 DIAGNOSIS — M79601 Pain in right arm: Secondary | ICD-10-CM | POA: Diagnosis not present

## 2020-04-08 DIAGNOSIS — R0789 Other chest pain: Secondary | ICD-10-CM | POA: Diagnosis not present

## 2020-04-08 DIAGNOSIS — Z5321 Procedure and treatment not carried out due to patient leaving prior to being seen by health care provider: Secondary | ICD-10-CM | POA: Insufficient documentation

## 2020-04-08 DIAGNOSIS — E785 Hyperlipidemia, unspecified: Secondary | ICD-10-CM | POA: Diagnosis not present

## 2020-04-08 DIAGNOSIS — R110 Nausea: Secondary | ICD-10-CM | POA: Diagnosis not present

## 2020-04-08 DIAGNOSIS — Z8673 Personal history of transient ischemic attack (TIA), and cerebral infarction without residual deficits: Secondary | ICD-10-CM | POA: Diagnosis not present

## 2020-04-08 DIAGNOSIS — E1022 Type 1 diabetes mellitus with diabetic chronic kidney disease: Secondary | ICD-10-CM | POA: Diagnosis not present

## 2020-04-08 DIAGNOSIS — Z20822 Contact with and (suspected) exposure to covid-19: Secondary | ICD-10-CM | POA: Diagnosis not present

## 2020-04-08 DIAGNOSIS — I10 Essential (primary) hypertension: Secondary | ICD-10-CM | POA: Insufficient documentation

## 2020-04-08 DIAGNOSIS — N189 Chronic kidney disease, unspecified: Secondary | ICD-10-CM | POA: Diagnosis not present

## 2020-04-08 DIAGNOSIS — I129 Hypertensive chronic kidney disease with stage 1 through stage 4 chronic kidney disease, or unspecified chronic kidney disease: Secondary | ICD-10-CM | POA: Diagnosis not present

## 2020-04-08 DIAGNOSIS — M25511 Pain in right shoulder: Secondary | ICD-10-CM | POA: Diagnosis not present

## 2020-04-08 LAB — BASIC METABOLIC PANEL
Anion gap: 10 (ref 5–15)
BUN: 33 mg/dL — ABNORMAL HIGH (ref 6–20)
CO2: 22 mmol/L (ref 22–32)
Calcium: 8.5 mg/dL — ABNORMAL LOW (ref 8.9–10.3)
Chloride: 107 mmol/L (ref 98–111)
Creatinine, Ser: 2.13 mg/dL — ABNORMAL HIGH (ref 0.61–1.24)
GFR, Estimated: 36 mL/min — ABNORMAL LOW (ref >60)
Glucose, Bld: 166 mg/dL — ABNORMAL HIGH (ref 70–99)
Potassium: 3.6 mmol/L (ref 3.5–5.1)
Sodium: 139 mmol/L (ref 135–145)

## 2020-04-08 LAB — CBC WITH DIFFERENTIAL/PLATELET
Abs Immature Granulocytes: 0.01 10*3/uL (ref 0.00–0.07)
Basophils Absolute: 0 10*3/uL (ref 0.0–0.1)
Basophils Relative: 1 %
Eosinophils Absolute: 0.2 10*3/uL (ref 0.0–0.5)
Eosinophils Relative: 3 %
HCT: 39.8 % (ref 39.0–52.0)
Hemoglobin: 13.2 g/dL (ref 13.0–17.0)
Immature Granulocytes: 0 %
Lymphocytes Relative: 20 %
Lymphs Abs: 1.2 10*3/uL (ref 0.7–4.0)
MCH: 29.3 pg (ref 26.0–34.0)
MCHC: 33.2 g/dL (ref 30.0–36.0)
MCV: 88.4 fL (ref 80.0–100.0)
Monocytes Absolute: 0.4 10*3/uL (ref 0.1–1.0)
Monocytes Relative: 7 %
Neutro Abs: 4.1 10*3/uL (ref 1.7–7.7)
Neutrophils Relative %: 69 %
Platelets: 133 10*3/uL — ABNORMAL LOW (ref 150–400)
RBC: 4.5 MIL/uL (ref 4.22–5.81)
RDW: 12.2 % (ref 11.5–15.5)
WBC: 6 10*3/uL (ref 4.0–10.5)
nRBC: 0 % (ref 0.0–0.2)

## 2020-04-08 LAB — TROPONIN I (HIGH SENSITIVITY)
Troponin I (High Sensitivity): 8 ng/L (ref <18)
Troponin I (High Sensitivity): 7 ng/L (ref <18)

## 2020-04-08 NOTE — ED Triage Notes (Signed)
Patient woke up this morning ( 1 am) with right shoulder pain radiating to right arm with mild chest discomfort , denies SOB , no emesis or diaphoresis , hypertensive at triage .

## 2020-04-10 ENCOUNTER — Encounter: Payer: Self-pay | Admitting: Internal Medicine

## 2020-04-10 NOTE — Progress Notes (Signed)
[facility] Note  04/14/2020     CHIEF COMPLAINT Patient presents for Retina Follow Up   HISTORY OF PRESENT ILLNESS: Adrian Neal is a 54 y.o. male who presents to the clinic today for:   HPI    Retina Follow Up    Patient presents with  Diabetic Retinopathy.  In both eyes.  This started years ago.  Severity is moderate.  Duration of 9 months.  Since onset it is stable.  I, the attending physician,  performed the HPI with the patient and updated documentation appropriately.          Comments    54 y/o male pt here for 9 mo f/u for PDR OU.  No change in [place] OU.  Denies pain, FOL, floaters.  No gtts.       Last edited by [name], MD on 04/17/2020 12:40 AM. (History)    pt states he has not found a general ophthalmologist yet, he states sometimes when he is watching TV, he has to focus harder to see  Referring physician: [name], MD [place],  [place] [address]  HISTORICAL INFORMATION:   Selected notes from the MEDICAL RECORD NUMBER Referred by Dr. [name] for DM exam LEE:  Ocular Hx-cataract OD, pseudo OS PMH-DM (type 1, A1C: 8.6, takes novolog), heart murmur, HLD, HTN, stroke    CURRENT MEDICATIONS: No current outpatient medications on file. (Ophthalmic Drugs)   No current facility-administered medications for this visit. (Ophthalmic Drugs)   Current Outpatient Medications (Other)  Medication Sig  . alendronate (FOSAMAX) 70 MG tablet Take 1 tablet (70 mg total) by mouth every 7 (seven) days. Take with a full glass of water on an empty stomach.  Marland Kitchen aspirin EC 81 MG tablet Take 1 tablet (81 mg total) by mouth daily.  Marland Kitchen atorvastatin (LIPITOR) 40 MG tablet TAKE 1 TABLET BY MOUTH ONCE DAILY 6 IN THE EVENING **PLEASE  KEEP  UPCOMING  APPT**  . calcium carbonate (OSCAL) 1500 (600 Ca) MG TABS tablet Take 1 tablet (1,500 mg total) by mouth 2 (two) times daily with a meal.  . Calcium Carbonate-Vit  D-Min (CALCIUM 600+D PLUS MINERALS) 600-400 MG-UNIT TABS Take by mouth.  . Calcium Carbonate-Vitamin D 600-400 MG-UNIT tablet Take 1 tablet by mouth 2 (two) times daily.   . carvedilol (COREG) 12.5 MG tablet TAKE 1 TABLET BY MOUTH TWICE DAILY WITH A MEAL  . cloNIDine (CATAPRES) 0.2 MG tablet Take 1 tablet by mouth twice daily  . clopidogrel (PLAVIX) 75 MG tablet TAKE 1 TABLET BY MOUTH ONCE DAILY -ASPIRIN  AND  PLAVIX  FOR  3  MONTHS,  AFTER  THAT-  STOP  ASPIRIN-  TAKE  PLAVIX  ALONE  . Continuous Blood Gluc Receiver (DEXCOM G6 RECEIVER) DEVI 1 Device by Does not apply route as directed.  . Continuous Blood Gluc Sensor (DEXCOM G6 SENSOR) MISC 3 Devices by Does not apply route as directed.  . Continuous Blood Gluc Transmit (DEXCOM G6 TRANSMITTER) MISC 1 Device by Does not apply route as directed.  . Diclofenac Sodium (PENNSAID) 2 % SOLN Pennsaid 20 mg/gram/actuation (2 %) topical soln in metered-dose pump  APPLY 1 PUMP TOPICALLY TO THE AFFECTED AREA(S) TWICE DAILY  . fluticasone (FLONASE) 50 MCG/ACT nasal spray Place 2 sprays into both nostrils daily.  . furosemide (LASIX) 20 MG tablet Take 1 tablet (20 mg total) by mouth daily.  . hydrALAZINE (APRESOLINE) 10 MG tablet 10 mg  as directed  . Insulin Infusion Pump (T:SLIM INSULIN PUMP) DEVI by Does not apply route.  . insulin lispro (HUMALOG) 100 UNIT/ML injection Max daily dose of 100 units via pump DX E10.59 (vials)  . lisinopril (ZESTRIL) 20 MG tablet Take 1 tablet by mouth once daily  . meclizine (ANTIVERT) 25 MG tablet 25 mg as directed  . pantoprazole (PROTONIX) 40 MG tablet Take 1 tablet by mouth once daily  . tamsulosin (FLOMAX) 0.4 MG CAPS capsule 0.4 mg as directed  . amLODipine (NORVASC) 10 MG tablet Take 0.5 tablets (5 mg total) by mouth daily.   No current facility-administered medications for this visit. (Other)      REVIEW OF SYSTEMS: ROS    Positive for: Neurological, Musculoskeletal, Endocrine, Eyes, Respiratory   Negative  for: Constitutional, Gastrointestinal, Skin, Genitourinary, HENT, Cardiovascular, Psychiatric, Allergic/Imm, Heme/Lymph   Last edited by [name], COA on 04/14/2020  9:54 AM. (History)       ALLERGIES No Known Allergies  PAST MEDICAL HISTORY Past Medical History:  Diagnosis Date  . Cataract    Mixed form OS  . Chronic kidney disease   . Diabetes mellitus without complication ([place])    diagnosed at age 11  . Eye problems   . Heart murmur 1996  . High cholesterol    patient denies but take preventative medicine  . Hypertension   . Hypertensive retinopathy    OU  . Retinopathy due to secondary diabetes mellitus ([place])    PDR OU  . Sleep apnea    on BiPAP  . Stroke [facility]) 2016   Past Surgical History:  Procedure Laterality Date  . ANTERIOR APPROACH HEMI HIP ARTHROPLASTY Left 01/01/2019   Procedure: ANTERIOR APPROACH total hip ARTHROPLASTY;  Surgeon: Rod Can, MD;  Location: [facility];  Service: Orthopedics;  Laterality: Left;  . CATARACT EXTRACTION Right 1994  . CATARACT EXTRACTION W/ INTRAOCULAR LENS IMPLANT  1994  . EYE SURGERY Right 1991   vitrectomy  . EYE SURGERY Right 1992   scar tissue removed from retina  . EYE SURGERY Right 1994   Cat Sx    FAMILY HISTORY Family History  Problem Relation Age of Onset  . Hypertension Mother   . Hyperlipidemia Mother   . Hyperlipidemia Father   . Hypertension Father   . Diabetes Father   . Kidney disease Father        had a kidney transplant   . Stroke Brother   . Diabetes Brother   . Pulmonary fibrosis Paternal Grandmother   . Lung cancer Paternal Grandfather   . Diabetes Daughter   . Colon cancer Neg Hx   . Esophageal cancer Neg Hx   . Rectal cancer Neg Hx   . Stomach cancer Neg Hx     SOCIAL HISTORY Social History   Tobacco Use  . Smoking status: Former Smoker    Packs/day: 1.00    Years: 6.00    Pack years: 6.00    Quit date: 04/30/1991    Years since quitting: 28.9  . Smokeless tobacco: Never Used   Vaping Use  . Vaping Use: Never used  Substance Use Topics  . Alcohol use: No  . Drug use: Never         OPHTHALMIC EXAM:  Base Eye Exam    Visual Acuity (Snellen - Linear)      Right Left   Dist cc 20/60 -2 20/25 -2   Dist ph cc NI NI   Correction: Glasses  Tonometry (Tonopen, 9:57 AM)      Right Left   Pressure 14 11       Pupils      Dark Light Shape React APD   Right 3 2 Round Brisk None   Left 3 2 Round Brisk None       Visual Fields (Counting fingers)      Left Right    Full Full       Extraocular Movement      Right Left    Full, Ortho Full, Ortho       Neuro/Psych    Oriented x3: Yes   Mood/Affect: Normal       Dilation    Both eyes: 1.0% Mydriacyl, 2.5% Phenylephrine @ 9:57 AM        Slit Lamp and Fundus Exam    Slit Lamp Exam      Right Left   Lids/Lashes dermatochalasis, Ptosis dermatochalasis   Conjunctiva/Sclera Mild Conjunctivochalasis Mild Conjunctivochalasis   Cornea fine endopigment, focal band K at 3:00, well healed temporal cataract wounds, mild arcus Clear   Anterior Chamber Deep and quiet deep, narrow temporal angle   Iris Round, poorly dilated, no NVI round, moderately dilated, no NVI, mild anterior bowing   Lens PC IOL in good position 2-3+Nuclear sclerosis, 2-3+ Cortical cataract   Vitreous post vitrectomy, vitreous condensations vitreous syneresis       Fundus Exam      Right Left   Disc +cupping, mild Pallor, fibrosis at 4:30, peripapillary CR scarring at 9:00, Sharp rim Pink and Sharp, trace fibrosis   C/D Ratio 0.7 0.2   Macula Flat, Blunted foveal reflex, Retinal pigment epithelial mottling, Atrophy, focal peripapillary pigment clump nasal macula, No heme or edema Flat, blunted foveal reflex, mild RPE mottling, trace Epiretinal membrane, No heme or edema   Vessels Vascular attenuation, Tortuous Vascular attenuation   Periphery Attached, 360 PRP, No heme  attached, 360 PRP to arcades, No heme            IMAGING AND PROCEDURES  Imaging and Procedures for @TODAY @  OCT, Retina - OU - Both Eyes       Right Eye Quality was good. Central Foveal Thickness: 200. Progression has been stable. Findings include normal foveal contour, no IRF, no SRF, outer retinal atrophy, epiretinal membrane, retinal drusen  (Stable from prior).   Left Eye Quality was good. Central Foveal Thickness: 261. Progression has been stable. Findings include normal foveal contour, no SRF, no IRF, epiretinal membrane (Trace cystic changes, no DME ).   Notes *Images captured and stored on drive  Diagnosis / Impression:  Hx of PDR OU No DME OU ERM OU (OD>OS)   Clinical management:  See below  Abbreviations: NFP - Normal foveal profile. CME - cystoid macular edema. PED - pigment epithelial detachment. IRF - intraretinal fluid. SRF - subretinal fluid. EZ - ellipsoid zone. ERM - epiretinal membrane. ORA - outer retinal atrophy. ORT - outer retinal tubulation. SRHM - subretinal hyper-reflective material                 ASSESSMENT/PLAN:    ICD-10-CM   1. 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 Retinal edema  H35.81 OCT, Retina - OU - Both Eyes  3. Essential hypertension  I10   4. Hypertensive retinopathy of both eyes  H35.033   5. Pseudophakia  Z96.1   6. Combined forms of age-related cataract  of left eye  H25.812     1,2. DM1 w/ Proliferative diabetic retinopathy w/o DME, OU  - history of extensive retinal work done in [place], [place] in 90s per pt -- including PPV w/ laser OD  - saw Dr. Zigmund Daniel once in 2013 and then pt lost insurance and has not had regular eye care since  - exam shows extensive PRP and regressed fibrosis/NV OD -- no significant edema  - good PRP in place OU  - FA (3.17.2021) shows scattered MA, but no active NV  - OCT without diabetic macular edema, both eyes   - BCVA 20/60 OD (stable); 20/25  OS  - OD likely affected by retinal ischemia vs ischemic optic atrophy  - discussed findings and prognosis  - no intervention indicated at this time  - recommend monitoring for now  - f/u in 1 year, sooner prn -- DFE/OCT  3,4. Hypertensive retinopathy OU  - discussed importance of tight BP control  - monitor  5. Pseudophakia OD  - s/p CE/IOL OD by surgeon at [facility] [phone]  - doing well  - monitor  6. Mixed Cataract OS  - The symptoms of cataract, surgical options, and treatments and risks were discussed with patient.  - discussed diagnosis and progression  - not yet visually significant  - monitor for now   Ophthalmic Meds Ordered this visit:  No orders of the defined types were placed in this encounter.      Return in about 1 year (around 04/14/2021) for f/u DM exam, DFE, OCT.  There are no Patient Instructions on file for this visit.   Explained the diagnoses, plan, and follow up with the patient and they expressed understanding.  Patient expressed understanding of the importance of proper follow up care.   This document serves as a record of services personally performed by Gardiner Sleeper, MD, PhD. It was created on their behalf by San Jetty. Owens Shark, OA an ophthalmic technician. The creation of this record is the provider's dictation and/or activities during the visit.    Electronically signed by: San Jetty. Owens Shark, [place] 12.13.2021 12:41 AM   Gardiner Sleeper, M.D., Ph.D. Diseases & Surgery of the Retina and Vitreous [facility]  I have reviewed the above documentation for accuracy and completeness, and I agree with the above. Gardiner Sleeper, M.D., Ph.D. 04/17/20 12:42 AM   Abbreviations: M myopia (nearsighted); A astigmatism; H hyperopia (farsighted); P presbyopia; Mrx spectacle prescription;  CTL contact lenses; OD right eye; OS left eye; OU both eyes  XT exotropia; ET esotropia; PEK punctate epithelial keratitis; PEE punctate epithelial  erosions; DES dry eye syndrome; MGD meibomian gland dysfunction; ATs artificial tears; PFAT's preservative free artificial tears; [place] nuclear sclerotic cataract; PSC posterior subcapsular cataract; ERM epi-retinal membrane; PVD posterior vitreous detachment; RD retinal detachment; DM diabetes mellitus; DR diabetic retinopathy; NPDR non-proliferative diabetic retinopathy; PDR proliferative diabetic retinopathy; CSME clinically significant macular edema; DME diabetic macular edema; dbh dot blot hemorrhages; CWS cotton wool spot; POAG primary open angle glaucoma; C/D cup-to-disc ratio; HVF humphrey visual field; GVF goldmann visual field; OCT optical coherence tomography; IOP intraocular pressure; BRVO Branch retinal vein occlusion; CRVO central retinal vein occlusion; CRAO central retinal artery occlusion; BRAO branch retinal artery occlusion; RT retinal tear; SB scleral buckle; PPV pars plana vitrectomy; VH Vitreous hemorrhage; PRP panretinal laser photocoagulation; IVK intravitreal kenalog; VMT vitreomacular traction; MH Macular hole;  NVD neovascularization of the disc; NVE neovascularization elsewhere; AREDS age related eye  disease study; ARMD age related macular degeneration; POAG primary open angle glaucoma; EBMD epithelial/anterior basement membrane dystrophy; ACIOL anterior chamber intraocular lens; IOL intraocular lens; PCIOL posterior chamber intraocular lens; Phaco/IOL phacoemulsification with intraocular lens placement; [place] photorefractive keratectomy; LASIK laser assisted in situ keratomileusis; HTN hypertension; DM diabetes mellitus; COPD chronic obstructive pulmonary disease

## 2020-04-10 NOTE — Telephone Encounter (Signed)
Hello Adrian Neal, We have received the results from the autotitration trial period in order to set your non-autotitration machine to the most advantageous pressure based on the recorded trial.   I thank you for your compliance it must have been difficult at times.  This would be Biphasic PAP at 17.5/ 12.5 cm water, yet your residual AHI was still very high, at 20/h. Based on this we likely need a pressure of 20/ 15 cm water. That is the highest a BIPAP can provide.  We are calling [facility] right now. I will give you an update later tonight.    CD    FYI Dr Ethelene Hal.

## 2020-04-11 ENCOUNTER — Other Ambulatory Visit: Payer: Self-pay | Admitting: Family Medicine

## 2020-04-11 DIAGNOSIS — E109 Type 1 diabetes mellitus without complications: Secondary | ICD-10-CM | POA: Diagnosis not present

## 2020-04-11 DIAGNOSIS — E782 Mixed hyperlipidemia: Secondary | ICD-10-CM

## 2020-04-11 DIAGNOSIS — I10 Essential (primary) hypertension: Secondary | ICD-10-CM

## 2020-04-12 DIAGNOSIS — I10 Essential (primary) hypertension: Secondary | ICD-10-CM | POA: Diagnosis not present

## 2020-04-12 DIAGNOSIS — E1169 Type 2 diabetes mellitus with other specified complication: Secondary | ICD-10-CM | POA: Diagnosis not present

## 2020-04-12 DIAGNOSIS — E1021 Type 1 diabetes mellitus with diabetic nephropathy: Secondary | ICD-10-CM | POA: Diagnosis not present

## 2020-04-12 DIAGNOSIS — M79603 Pain in arm, unspecified: Secondary | ICD-10-CM | POA: Diagnosis not present

## 2020-04-12 DIAGNOSIS — I6381 Other cerebral infarction due to occlusion or stenosis of small artery: Secondary | ICD-10-CM | POA: Diagnosis not present

## 2020-04-12 NOTE — Telephone Encounter (Signed)
LVM x3, unable to reach patient to discuss recent hypoglycemia symptoms. Will continue to monitor and discuss in detail at next visit.   Doristine Section Clinical Pharmacist [facility]  [phone]

## 2020-04-13 ENCOUNTER — Encounter: Payer: Self-pay | Admitting: Internal Medicine

## 2020-04-14 ENCOUNTER — Ambulatory Visit (INDEPENDENT_AMBULATORY_CARE_PROVIDER_SITE_OTHER): Payer: PPO | Admitting: Ophthalmology

## 2020-04-14 ENCOUNTER — Other Ambulatory Visit: Payer: Self-pay

## 2020-04-14 ENCOUNTER — Encounter (INDEPENDENT_AMBULATORY_CARE_PROVIDER_SITE_OTHER): Payer: Self-pay | Admitting: Ophthalmology

## 2020-04-14 DIAGNOSIS — H25812 Combined forms of age-related cataract, left eye: Secondary | ICD-10-CM

## 2020-04-14 DIAGNOSIS — I10 Essential (primary) hypertension: Secondary | ICD-10-CM

## 2020-04-14 DIAGNOSIS — H35033 Hypertensive retinopathy, bilateral: Secondary | ICD-10-CM | POA: Diagnosis not present

## 2020-04-14 DIAGNOSIS — Z961 Presence of intraocular lens: Secondary | ICD-10-CM | POA: Diagnosis not present

## 2020-04-14 DIAGNOSIS — H3581 Retinal edema: Secondary | ICD-10-CM | POA: Diagnosis not present

## 2020-04-14 DIAGNOSIS — E103593 Type 1 diabetes mellitus with proliferative diabetic retinopathy without macular edema, bilateral: Secondary | ICD-10-CM

## 2020-04-17 ENCOUNTER — Encounter (INDEPENDENT_AMBULATORY_CARE_PROVIDER_SITE_OTHER): Payer: Self-pay | Admitting: Ophthalmology

## 2020-04-19 ENCOUNTER — Encounter: Payer: Self-pay | Admitting: Internal Medicine

## 2020-04-19 DIAGNOSIS — G4737 Central sleep apnea in conditions classified elsewhere: Secondary | ICD-10-CM | POA: Diagnosis not present

## 2020-04-23 ENCOUNTER — Other Ambulatory Visit: Payer: Self-pay | Admitting: Family Medicine

## 2020-04-23 DIAGNOSIS — I10 Essential (primary) hypertension: Secondary | ICD-10-CM

## 2020-04-23 DIAGNOSIS — N179 Acute kidney failure, unspecified: Secondary | ICD-10-CM

## 2020-05-05 DIAGNOSIS — I6381 Other cerebral infarction due to occlusion or stenosis of small artery: Secondary | ICD-10-CM | POA: Diagnosis not present

## 2020-05-05 NOTE — Progress Notes (Signed)
[facility] approved patient and enrolled from 04/2020 to 03/2021  Sent a copy to scan

## 2020-05-22 DIAGNOSIS — E109 Type 1 diabetes mellitus without complications: Secondary | ICD-10-CM | POA: Diagnosis not present

## 2020-05-23 ENCOUNTER — Encounter: Payer: Self-pay | Admitting: Internal Medicine

## 2020-05-23 ENCOUNTER — Other Ambulatory Visit: Payer: Self-pay

## 2020-05-23 ENCOUNTER — Ambulatory Visit: Payer: PPO | Admitting: Internal Medicine

## 2020-05-23 VITALS — BP 140/74 | HR 82 | Ht 67.0 in | Wt 193.2 lb

## 2020-05-23 DIAGNOSIS — N1831 Chronic kidney disease, stage 3a: Secondary | ICD-10-CM

## 2020-05-23 DIAGNOSIS — E1021 Type 1 diabetes mellitus with diabetic nephropathy: Secondary | ICD-10-CM

## 2020-05-23 DIAGNOSIS — E10319 Type 1 diabetes mellitus with unspecified diabetic retinopathy without macular edema: Secondary | ICD-10-CM | POA: Diagnosis not present

## 2020-05-23 LAB — POCT GLYCOSYLATED HEMOGLOBIN (HGB A1C): Hemoglobin A1C: 6.9 % — AB (ref 4.0–5.6)

## 2020-05-23 NOTE — Progress Notes (Signed)
Name: Adrian Neal  Age/ Sex: 55 y.o., male   MRN/ DOB: [id number], [birth year]     PCP: [name], MD   Reason for Endocrinology Evaluation: Type 1 Diabetes Mellitus  Initial Endocrine Consultative Visit: 06/18/2018    PATIENT IDENTIFIER: Adrian Neal is a 55 y.o. male with a past medical history of .HTN,Hyperlipidemia and Hx of CVA  The patient has followed with Endocrinology clinic since 06/18/2018 for consultative assistance with management of his diabetes.  DIABETIC HISTORY:  Mr. Skalla was diagnosed with T1DM at age 38, he has been on insulin pump since 2012. His hemoglobin A1c has ranged from 7.2% , peaking at 9.0%   In 11/2018 he switched to the T-Slim   SUBJECTIVE:   During the last visit (11/16/2019): A1c 6.0 %. we adjusted his I:C ratio during the day      Today (05/23/2020): Mr. [name] is here for a follow up on diabetes management and his new pump.  He checks his blood sugars multiple times daily, preprandial and bedtime. The patient has had hypoglycemic episodes since the last clinic visit, which typically occur 2 x /week - most often occuring during the day after meals. The patient is symptomatic with these episodes.. Otherwise, the patient has not required any recent emergency interventions for hypoglycemia and has not had recent hospitalizations secondary to hyper or hypoglycemic episodes.    This patient with type 1 diabetes is treated with humalog (insulin pump). The clinical list was updated.     Current pump settings    Pump   T-Slim      Insulin type   HUMALOG    Basal rate       0000-0000   1.0 u/h               I:C ratio   0000-0600 1:9   0600-1100  1:7   1100-0000 1:9      Sensitivity      0000 30      AIT      0000  5      Goal       0000  110             Type & Model of Pump:T-Slim  Insulin Type: Currently using Humalog   PUMP STATISTICS:  1/11-1/24/2022 Average BG Dashboard : 161 Average  daily Carbs 199  BG Readings: 279/ day Average Total Daily Insulin: 73.68 u/day  Average Daily Basal: 21(33 %) Average Daily Bolus: 42 (68 %)            HISTORY:  Past Medical History:  Past Medical History:  Diagnosis Date  . Cataract    Mixed form OS  . Chronic kidney disease   . Diabetes mellitus without complication ([place])    diagnosed at age 35  . Eye problems   . Heart murmur 1996  . High cholesterol    patient denies but take preventative medicine  . Hypertension   . Hypertensive retinopathy    OU  . Retinopathy due to secondary diabetes mellitus ([place])    PDR OU  . Sleep apnea    on BiPAP  . Stroke [facility]) 2016   Past Surgical History:  Past Surgical History:  Procedure Laterality Date  . ANTERIOR APPROACH HEMI HIP ARTHROPLASTY Left 01/01/2019   Procedure: ANTERIOR APPROACH total hip ARTHROPLASTY;  Surgeon: Rod Can, MD;  Location: [facility];  Service: Orthopedics;  Laterality: Left;  . CATARACT EXTRACTION Right  1994  . CATARACT EXTRACTION W/ INTRAOCULAR LENS IMPLANT  1994  . EYE SURGERY Right 1991   vitrectomy  . EYE SURGERY Right 1992   scar tissue removed from retina  . EYE SURGERY Right 1994   Cat Sx    Social History:  reports that he quit smoking about 29 years ago. He has a 6.00 pack-year smoking history. He has never used smokeless tobacco. He reports that he does not drink alcohol and does not use drugs. Family History:  Family History  Problem Relation Age of Onset  . Hypertension Mother   . Hyperlipidemia Mother   . Hyperlipidemia Father   . Hypertension Father   . Diabetes Father   . Kidney disease Father        had a kidney transplant   . Stroke Brother   . Diabetes Brother   . Pulmonary fibrosis Paternal Grandmother   . Lung cancer Paternal Grandfather   . Diabetes Daughter   . Colon cancer Neg Hx   . Esophageal cancer Neg Hx   . Rectal cancer Neg Hx   . Stomach cancer Neg Hx     HOME MEDICATIONS: Allergies as of 05/23/2020    No Known Allergies     Medication List       Accurate as of May 23, 2020  1:32 PM. If you have any questions, ask your nurse or doctor.        alendronate 70 MG tablet Commonly known as: FOSAMAX Take 1 tablet (70 mg total) by mouth every 7 (seven) days. Take with a full glass of water on an empty stomach.   amLODipine 10 MG tablet Commonly known as: NORVASC Take 0.5 tablets (5 mg total) by mouth daily.   aspirin EC 81 MG tablet Take 1 tablet (81 mg total) by mouth daily.   atorvastatin 40 MG tablet Commonly known as: LIPITOR TAKE 1 TABLET BY MOUTH ONCE DAILY 6 IN THE EVENING **PLEASE  KEEP  UPCOMING  APPT**   Calcium 600+D Plus Minerals 600-400 MG-UNIT Tabs Take by mouth.   calcium carbonate 1500 (600 Ca) MG Tabs tablet Commonly known as: OSCAL Take 1 tablet (1,500 mg total) by mouth 2 (two) times daily with a meal.   Calcium Carbonate-Vitamin D 600-400 MG-UNIT tablet Take 1 tablet by mouth 2 (two) times daily.   carvedilol 25 MG tablet Commonly known as: COREG Take 25 mg by mouth 2 (two) times daily. What changed: Another medication with the same name was removed. Continue taking this medication, and follow the directions you see here. Changed by: Dorita Sciara, MD   cloNIDine 0.2 MG tablet Commonly known as: CATAPRES Take 1 tablet by mouth twice daily   clopidogrel 75 MG tablet Commonly known as: PLAVIX TAKE 1 TABLET BY MOUTH ONCE DAILY -ASPIRIN  AND  PLAVIX  FOR  3  MONTHS,  AFTER  THAT-  STOP  ASPIRIN-  TAKE  PLAVIX  ALONE   Dexcom G6 Receiver Devi 1 Device by Does not apply route as directed.   Dexcom G6 Sensor Misc 3 Devices by Does not apply route as directed.   Dexcom G6 Transmitter Misc 1 Device by Does not apply route as directed.   fluticasone 50 MCG/ACT nasal spray Commonly known as: FLONASE Place 2 sprays into both nostrils daily.   furosemide 20 MG tablet Commonly known as: LASIX Take 1 tablet (20 mg total) by mouth daily.    hydrALAZINE 10 MG tablet Commonly known as: APRESOLINE 10 mg as  directed   insulin lispro 100 UNIT/ML injection Commonly known as: HumaLOG Max daily dose of 100 units via pump DX E10.59 (vials)   lisinopril 20 MG tablet Commonly known as: ZESTRIL Take 1 tablet by mouth once daily   meclizine 25 MG tablet Commonly known as: ANTIVERT 25 mg as directed   pantoprazole 40 MG tablet Commonly known as: PROTONIX Take 1 tablet by mouth once daily   Pennsaid 2 % Soln Generic drug: Diclofenac Sodium Pennsaid 20 mg/gram/actuation (2 %) topical soln in metered-dose pump  APPLY 1 PUMP TOPICALLY TO THE AFFECTED AREA(S) TWICE DAILY   spironolactone 25 MG tablet Commonly known as: ALDACTONE Take by mouth.   T:slim Insulin Pump Devi by Does not apply route.   tamsulosin 0.4 MG Caps capsule Commonly known as: FLOMAX 0.4 mg as directed      PHYSICAL EXAM: VS: BP 140/74   Pulse 82   Ht 5\' 7"  (1.702 m)   Wt 193 lb 4 oz (87.7 kg)   SpO2 98%   BMI 30.27 kg/m    EXAM: General: Pt appears well and is in NAD  Lungs: Clear with good BS bilat with no rales, rhonchi, or wheezes  Heart: Auscultation: RRR   Extremities:  BL LE: no pretibial edema   Mental Status: Judgment, insight: intact Orientation: oriented to time, place, and person Mood and affect: no depression, anxiety, or agitation   DM Foot Exam 11/16/2019  The skin of the feet is without sores or ulcerations, toe nails dystrophic The pedal pulses are 1+ on right and 1+ on left. The sensation is decreased  to a screening 5.07, 10 gram monofilament on the right, normal on left      DATA REVIEWED:  Lab Results  Component Value Date   HGBA1C 6.9 (A) 05/23/2020   HGBA1C 6.0 (A) 11/16/2019   HGBA1C 6.8 (A) 07/13/2019   Lab Results  Component Value Date   MICROALBUR 28.4 (H) 10/28/2018   LDLCALC 60 10/28/2018   CREATININE 2.13 (H) 04/08/2020   Lab Results  Component Value Date   MICRALBCREAT 53.4 (H) 10/28/2018     Lab Results  Component Value Date   CHOL 107 10/28/2018   HDL 35.10 (L) 10/28/2018   LDLCALC 60 10/28/2018   LDLDIRECT 57.0 01/04/2020   TRIG 58.0 10/28/2018   CHOLHDL 3 10/28/2018         ASSESSMENT / PLAN / RECOMMENDATIONS:   1) Type 1 Diabetes Mellitus, Optimally controlled, With CKD III, retinopathic and  macrovascular complications - Most recent A1c of 6.9  %. Goal A1c < 7.5 %.    - Pt has ben noted with fluctuating hyperglycemia, that is due to dietary indiscretions, he admits to eating more then usual over the holidays. He has been noted with postprandial hypoglycmeia, that he attributes to [facility] bolusing for hyperglycemia   - I will make the following adjustments with reducing basal rate during the day and reducing I: C ratio    MEDICATIONS:  Pump   T-Slim      Insulin type   HUMALOG    Basal rate       0000-1100   1.0 u/h    1100-0000 0.9 u/h          I:C ratio   0000-0600 1:9   0600-1100  1:6   1100-0000 1:8      Sensitivity      0000 30      AIT      0000  5  Goal       0000  110      EDUCATION / INSTRUCTIONS:  BG monitoring instructions: Patient is instructed to check his blood sugars 4 times a day, before meals and bedtime.  Call [place] [facility] if: BG persistently < 70  . I reviewed the Rule of 15 for the treatment of hypoglycemia in detail with the patient. Literature supplied.    F/U in 3 months      Signed electronically by: Mack Guise, MD  [facility]  [address] [place], [place] [address] Phone: [phone] FAX: [phone]   CC: [name], MD [place] [address] Phone: [phone]  Fax: [phone]  Return to Endocrinology clinic as below: Future Appointments  Date Time Provider [place]  06/06/2020  1:30 PM [place] [facility]  06/09/2020 11:00 AM [facility]  [facility] PHARMACIST [facility]  07/03/2020  8:30 AM [name], MD [facility]  08/22/2020 10:30 AM Evanna Washinton, Melanie Crazier, MD [facility]  04/16/2021  8:00 AM [name], MD [facility] None

## 2020-05-23 NOTE — Patient Instructions (Addendum)
Pump   T-Slim      Insulin type   HUMALOG    Basal rate       0000-1100   1.0 u/h    1100-0000 0.9 u/          I:C ratio   0000-0600 1:9   0600-1100  1:6   1100-0000 1:8      Sensitivity      0000 30      AIT      0000  5      Goal       0000  110

## 2020-06-05 ENCOUNTER — Other Ambulatory Visit: Payer: Self-pay | Admitting: Family Medicine

## 2020-06-05 NOTE — Progress Notes (Deleted)
Subjective:   Adrian Neal is a 55 y.o. male who presents for Medicare Annual/Subsequent preventive examination.  Review of Systems    ***       Objective:    There were no vitals filed for this visit. There is no height or weight on file to calculate BMI.  Advanced Directives 04/08/2020 11/25/2019 09/14/2019 06/02/2019 01/01/2019 12/31/2018 08/16/2015  Does Patient Have a Medical Advance Directive? No No No No No No No  Would patient like information on creating a medical advance directive? - Yes (MAU/Ambulatory/Procedural Areas - Information given) - No - Patient declined No - Patient declined No - Patient declined No - patient declined information    Current Medications (verified) Outpatient Encounter Medications as of 06/06/2020  Medication Sig  . alendronate (FOSAMAX) 70 MG tablet Take 1 tablet (70 mg total) by mouth every 7 (seven) days. Take with a full glass of water on an empty stomach.  Marland Kitchen amLODipine (NORVASC) 10 MG tablet Take 0.5 tablets (5 mg total) by mouth daily.  Marland Kitchen aspirin EC 81 MG tablet Take 1 tablet (81 mg total) by mouth daily.  Marland Kitchen atorvastatin (LIPITOR) 40 MG tablet TAKE 1 TABLET BY MOUTH ONCE DAILY 6 IN THE EVENING **PLEASE  KEEP  UPCOMING  APPT**  . calcium carbonate (OSCAL) 1500 (600 Ca) MG TABS tablet Take 1 tablet (1,500 mg total) by mouth 2 (two) times daily with a meal.  . Calcium Carbonate-Vit D-Min (CALCIUM 600+D PLUS MINERALS) 600-400 MG-UNIT TABS Take by mouth.  . Calcium Carbonate-Vitamin D 600-400 MG-UNIT tablet Take 1 tablet by mouth 2 (two) times daily.   . carvedilol (COREG) 25 MG tablet Take 25 mg by mouth 2 (two) times daily.  . cloNIDine (CATAPRES) 0.2 MG tablet Take 1 tablet by mouth twice daily  . clopidogrel (PLAVIX) 75 MG tablet TAKE 1 TABLET BY MOUTH ONCE DAILY -ASPIRIN  AND  PLAVIX  FOR  3  MONTHS,  AFTER  THAT-  STOP  ASPIRIN-  TAKE  PLAVIX  ALONE  . Continuous Blood Gluc Receiver (DEXCOM G6 RECEIVER) DEVI 1 Device by Does not apply route as  directed.  . Continuous Blood Gluc Sensor (DEXCOM G6 SENSOR) MISC 3 Devices by Does not apply route as directed.  . Continuous Blood Gluc Transmit (DEXCOM G6 TRANSMITTER) MISC 1 Device by Does not apply route as directed.  . Diclofenac Sodium (PENNSAID) 2 % SOLN Pennsaid 20 mg/gram/actuation (2 %) topical soln in metered-dose pump  APPLY 1 PUMP TOPICALLY TO THE AFFECTED AREA(S) TWICE DAILY  . fluticasone (FLONASE) 50 MCG/ACT nasal spray Place 2 sprays into both nostrils daily.  . furosemide (LASIX) 20 MG tablet Take 1 tablet (20 mg total) by mouth daily.  . hydrALAZINE (APRESOLINE) 10 MG tablet 10 mg as directed  . Insulin Infusion Pump (T:SLIM INSULIN PUMP) DEVI by Does not apply route.  . insulin lispro (HUMALOG) 100 UNIT/ML injection Max daily dose of 100 units via pump DX E10.59 (vials)  . lisinopril (ZESTRIL) 20 MG tablet Take 1 tablet by mouth once daily  . meclizine (ANTIVERT) 25 MG tablet 25 mg as directed  . pantoprazole (PROTONIX) 40 MG tablet Take 1 tablet by mouth once daily  . spironolactone (ALDACTONE) 25 MG tablet Take by mouth.  . tamsulosin (FLOMAX) 0.4 MG CAPS capsule 0.4 mg as directed   No facility-administered encounter medications on file as of 06/06/2020.    Allergies (verified) Patient has no known allergies.   History: Past Medical History:  Diagnosis Date  .  Cataract    Mixed form OS  . Chronic kidney disease   . Diabetes mellitus without complication ([place])    diagnosed at age 89  . Eye problems   . Heart murmur 1996  . High cholesterol    patient denies but take preventative medicine  . Hypertension   . Hypertensive retinopathy    OU  . Retinopathy due to secondary diabetes mellitus ([place])    PDR OU  . Sleep apnea    on BiPAP  . Stroke [facility]) 2016   Past Surgical History:  Procedure Laterality Date  . ANTERIOR APPROACH HEMI HIP ARTHROPLASTY Left 01/01/2019   Procedure: ANTERIOR APPROACH total hip ARTHROPLASTY;  Surgeon: Rod Can, MD;  Location:  [place];  Service: Orthopedics;  Laterality: Left;  . CATARACT EXTRACTION Right 1994  . CATARACT EXTRACTION W/ INTRAOCULAR LENS IMPLANT  1994  . EYE SURGERY Right 1991   vitrectomy  . EYE SURGERY Right 1992   scar tissue removed from retina  . EYE SURGERY Right 1994   Cat Sx   Family History  Problem Relation Age of Onset  . Hypertension Mother   . Hyperlipidemia Mother   . Hyperlipidemia Father   . Hypertension Father   . Diabetes Father   . Kidney disease Father        had a kidney transplant   . Stroke Brother   . Diabetes Brother   . Pulmonary fibrosis Paternal Grandmother   . Lung cancer Paternal Grandfather   . Diabetes Daughter   . Colon cancer Neg Hx   . Esophageal cancer Neg Hx   . Rectal cancer Neg Hx   . Stomach cancer Neg Hx    Social History   Socioeconomic History  . Marital status: Married    Spouse name: Not on file  . Number of children: 3  . Years of education: Not on file  . Highest education level: Not on file  Occupational History  . Occupation: Disabled  Tobacco Use  . Smoking status: Former Smoker    Packs/day: 1.00    Years: 6.00    Pack years: 6.00    Quit date: 04/30/1991    Years since quitting: 29.1  . Smokeless tobacco: Never Used  Vaping Use  . Vaping Use: Never used  Substance and Sexual Activity  . Alcohol use: No  . Drug use: Never  . Sexual activity: Not on file  Other Topics Concern  . Not on file  Social History Narrative   Right handed   Lives wife and kids in a two story home   Social Determinants of Health   Financial Resource Strain: Low Risk   . Difficulty of Paying Living Expenses: Not hard at all  Food Insecurity: Not on file  Transportation Needs: No Transportation Needs  . Lack of Transportation (Medical): No  . Lack of Transportation (Non-Medical): No  Physical Activity: Not on file  Stress: Not on file  Social Connections: Not on file    Tobacco Counseling Counseling given: Not Answered   Clinical  Intake:                 Diabetes:  Is the patient diabetic?  Yes  If diabetic, was a CBG obtained today?  No  Did the patient bring in their glucometer from home?  No  How often do you monitor your CBG's? ***.   Financial Strains and Diabetes Management:  Are you having any financial strains with the device, your supplies or your  medication? {YES/NO:21197}.  Does the patient want to be seen by Chronic Care Management for management of their diabetes?  {YES/NO:21197} Would the patient like to be referred to a Nutritionist or for Diabetic Management?  {YES/NO:21197}  Diabetic Exams:  Diabetic Eye Exam: Completed 07/14/2019.   Diabetic Foot Exam:Pt has been advised about the importance in completing this exam. To be completed by PCP           Activities of Daily Living No flowsheet data found.  Patient Care Team: [name], MD as PCP - General (Family Medicine) Germaine Pomfret, [facility] as Pharmacist (Pharmacist)  Indicate any recent Medical Services you may have received from other than [facility] providers in the past year (date may be approximate).     Assessment:   This is a routine wellness examination for [place].  Hearing/Vision screen No exam data present  Dietary issues and exercise activities discussed:    Goals    . Chronic Care Management     CARE PLAN ENTRY  Current Barriers:  . Chronic Disease Management support, education, and care coordination needs related to Hypertension, Hyperlipidemia, Diabetes, and Chronic Kidney Disease   Hypertension . Pharmacist Clinical Goal(s): o Over the next 90 days, patient will work with PharmD and providers to achieve BP goal <130/80 . Current regimen:  o Amlodipine 5 mg daily  o Carvedilol 12.5 mg twice daily  o Clonidine 0.2 mg twice daily  o Furosemide 20 mg daily  o Lisinopril 20 mg daily  . Interventions: o Recommend taking amlodipine at night to minimize leg swelling . Patient self care  activities - Over the next 90 days, patient will: o Check blood pressure at least twice weekly, document, and provide at future appointments o Ensure daily salt intake < 2300 mg/day o Slowly increase exercise tolerance with a goal of 150 minutes of moderate intensity exercise weekly  Hyperlipidemia . Pharmacist Clinical Goal(s): o Over the next 90 days, patient will work with PharmD and providers to maintain LDL goal < 70 . Current regimen:  o Atorvastatin 40 mg   Diabetes . Pharmacist Clinical Goal(s): o Over the next 90 days, patient will work with PharmD and providers to maintain A1c goal <7% . Current regimen:  o Humalog . Patient self care activities - Over the next 90 days, patient will: o Continue using Dexcom to check blood sugars and provide at future appointments o Contact provider with any episodes of hypoglycemia  Medication management . Pharmacist Clinical Goal(s): o Over the next 90 days, patient will work with PharmD and providers to maintain optimal medication adherence . Current pharmacy: [facility] . Interventions o Comprehensive medication review performed. o Continue current medication management strategy . Patient self care activities - Over the next 90 days, patient will: o Take medications as prescribed o Report any questions or concerns to PharmD and/or provider(s)    . DIET - REDUCE SUGAR INTAKE      Depression Screen PHQ 2/9 Scores 11/25/2019 09/14/2019 06/03/2019 06/02/2019 08/16/2015 09/19/2014  PHQ - 2 Score 0 0 0 0 0 0  PHQ- 9 Score - - - - - 0    Fall Risk Fall Risk  11/25/2019 09/14/2019 06/03/2019 06/02/2019 09/09/2018  Falls in the past year? 1 1 1 1  0  Number falls in past yr: 0 0 0 0 -  Injury with Fall? 1 1 1 1  -  Comment broke hip - broken left hip - -  Risk for fall due to : - - - - -  Follow up - - - Education provided;Falls prevention discussed -    FALL RISK PREVENTION PERTAINING TO THE HOME:  Any stairs in or around the home?  {YES/NO:21197} If so, are there any without handrails? {YES/NO:21197} Home free of loose throw rugs in walkways, pet beds, electrical cords, etc? {YES/NO:21197} Adequate lighting in your home to reduce risk of falls? {YES/NO:21197}  ASSISTIVE DEVICES UTILIZED TO PREVENT FALLS:  Life alert? {YES/NO:21197} Use of a cane, walker or w/c? {YES/NO:21197} Grab bars in the bathroom? {YES/NO:21197} Shower chair or bench in shower? {YES/NO:21197} Elevated toilet seat or a handicapped toilet? {YES/NO:21197}  TIMED UP AND GO:  Was the test performed? {YES/NO:21197}.  Length of time to ambulate 10 feet: *** sec.   {Appearance of QMGQ:6761950}  Cognitive Function:        Immunizations Immunization History  Administered Date(s) Administered  . Influenza,inj,Quad PF,6+ Mos 07/15/2018, 01/08/2019  . Pneumococcal Polysaccharide-23 03/02/2019  . Tdap 04/29/2017    TDAP status: Up to date  {Flu Vaccine status:2101806}  Pneumococcal vaccine status: Up to date  {Covid-19 vaccine status:2101808}  Qualifies for Shingles Vaccine? Yes   Zostavax completed No   Shingrix Completed?: No.    Education has been provided regarding the importance of this vaccine. Patient has been advised to call insurance company to determine out of pocket expense if they have not yet received this vaccine. Advised may also receive vaccine at local pharmacy or Health Dept. Verbalized acceptance and understanding.  Screening Tests Health Maintenance  Topic Date Due  . Hepatitis C Screening  Never done  . COVID-19 Vaccine (1) Never done  . FOOT EXAM  10/27/2019  . INFLUENZA VACCINE  11/28/2019  . OPHTHALMOLOGY EXAM  07/13/2020  . HEMOGLOBIN A1C  11/20/2020  . COLONOSCOPY (Pts 45-50yrs Insurance coverage will need to be confirmed)  07/01/2025  . TETANUS/TDAP  04/30/2027  . PNEUMOCOCCAL POLYSACCHARIDE VACCINE AGE 30-64 HIGH RISK  Completed  . HIV Screening  Completed    Health Maintenance  Health Maintenance  Due  Topic Date Due  . Hepatitis C Screening  Never done  . COVID-19 Vaccine (1) Never done  . FOOT EXAM  10/27/2019  . INFLUENZA VACCINE  11/28/2019    Colorectal cancer screening: Type of screening: Colonoscopy. Completed 07/02/2018. Repeat every 10 years  Lung Cancer Screening: (Low Dose CT Chest recommended if Age 72-80 years, 30 pack-year currently smoking OR have quit w/in 15years.) does not qualify.    Additional Screening:  Hepatitis C Screening: does qualify; Discuss with PCP  Vision Screening: Recommended annual ophthalmology exams for early detection of glaucoma and other disorders of the eye. Is the patient up to date with their annual eye exam?  {YES/NO:21197} Who is the provider or what is the name of the office in which the patient attends annual eye exams? *** If pt is not established with a provider, would they like to be referred to a provider to establish care? {YES/NO:21197}.   Dental Screening: Recommended annual dental exams for proper oral hygiene  Community Resource Referral / Chronic Care Management: CRR required this visit?  {YES/NO:21197}  CCM required this visit?  {YES/NO:21197}     Plan:     I have personally reviewed and noted the following in the patient's chart:   . Medical and social history . Use of alcohol, tobacco or illicit drugs  . Current medications and supplements . Functional ability and status . Nutritional status . Physical activity . Advanced directives . List of other physicians . Hospitalizations,  surgeries, and ER visits in previous 12 months . Vitals . Screenings to include cognitive, depression, and falls . Referrals and appointments  In addition, I have reviewed and discussed with patient certain preventive protocols, quality metrics, and best practice recommendations. A written personalized care plan for preventive services as well as general preventive health recommendations were provided to patient.     Marta Antu, LPN   06/01/4495  Nurse Health Advisor  Nurse Notes: ***

## 2020-06-06 ENCOUNTER — Ambulatory Visit: Payer: PPO

## 2020-06-06 NOTE — Telephone Encounter (Signed)
Patient scheduled for 02/11 @ 3:00.

## 2020-06-08 ENCOUNTER — Other Ambulatory Visit: Payer: Self-pay

## 2020-06-08 ENCOUNTER — Telehealth: Payer: Self-pay

## 2020-06-08 NOTE — Progress Notes (Signed)
Patient office appointment reschedule for 06/26/2020 at 2:00 pm with clinical pharamscit .  [place] Pharmacist Assistant [phone]

## 2020-06-09 ENCOUNTER — Encounter: Payer: Self-pay | Admitting: Family Medicine

## 2020-06-09 ENCOUNTER — Ambulatory Visit (INDEPENDENT_AMBULATORY_CARE_PROVIDER_SITE_OTHER): Payer: PPO | Admitting: Family Medicine

## 2020-06-09 ENCOUNTER — Ambulatory Visit: Payer: PPO

## 2020-06-09 VITALS — BP 120/66 | HR 81 | Temp 98.2°F | Wt 195.4 lb

## 2020-06-09 DIAGNOSIS — E1021 Type 1 diabetes mellitus with diabetic nephropathy: Secondary | ICD-10-CM

## 2020-06-09 DIAGNOSIS — J302 Other seasonal allergic rhinitis: Secondary | ICD-10-CM | POA: Diagnosis not present

## 2020-06-09 DIAGNOSIS — I10 Essential (primary) hypertension: Secondary | ICD-10-CM | POA: Diagnosis not present

## 2020-06-09 DIAGNOSIS — N1832 Chronic kidney disease, stage 3b: Secondary | ICD-10-CM

## 2020-06-09 LAB — BASIC METABOLIC PANEL
BUN/Creatinine Ratio: 17 (calc) (ref 6–22)
BUN: 44 mg/dL — ABNORMAL HIGH (ref 7–25)
CO2: 26 mmol/L (ref 20–32)
Calcium: 8.8 mg/dL (ref 8.6–10.3)
Chloride: 106 mmol/L (ref 98–110)
Creat: 2.55 mg/dL — ABNORMAL HIGH (ref 0.70–1.33)
Glucose, Bld: 153 mg/dL — ABNORMAL HIGH (ref 65–99)
Potassium: 4.6 mmol/L (ref 3.5–5.3)
Sodium: 139 mmol/L (ref 135–146)

## 2020-06-09 MED ORDER — SPIRONOLACTONE 25 MG PO TABS: 12.5000 mg | ORAL_TABLET | Freq: Every day | ORAL | 11 refills | Status: AC

## 2020-06-09 MED ORDER — HYDRALAZINE HCL 10 MG PO TABS: 10.0000 mg | ORAL_TABLET | Freq: Every day | ORAL | 2 refills | Status: DC

## 2020-06-09 MED ORDER — FLUTICASONE PROPIONATE 50 MCG/ACT NA SUSP: 2.0000 | Freq: Every day | NASAL | 11 refills | Status: DC

## 2020-06-09 NOTE — Progress Notes (Signed)
[facility] [address] [place] [address] Dept: [phone] Dept Fax: [phone]  Chronic Care Office Visit  Subjective:    Patient ID: Adrian Neal, male    DOB: [birth year], 55 y.o..   MRN: [id number]  Chief Complaint  Patient presents with  . Follow-up    Follow up on medication need refill on     History of Present Illness:  Patient is in today routine management of his health issues. Adrian Neal has a history of Type 1 DM, complicated by retinopathy and CKD. He is followed by endocrinology, cardiology, and neurology for his chronic issues. He had an adjustment of his spironolactone in Dec. However, he made a mistake by taking 25 mg (1 tab) daily rather than 12.5 mg (1/2 tab daily), so has run out early. Also, he had been advised to have a potassium level checked after 10 days, but had not been able to follow up for this. The change had been made due to uncontrolled hypertension. He has had previous strokes and has persistent numbness and dysequilibrium from this  Additionally, Adrian Neal is on Flonase related to some chronic eustachian tube issues. He notes this helps him sleep better and has done more good than his CPAP, which he quit using.   Past Medical History: Patient Active Problem List   Diagnosis Date Noted  . Anemia 01/04/2020  . Medication side effect 01/04/2020  . Osteopenia 10/04/2019  . Patellofemoral pain syndrome of both knees 06/08/2019  . Pain in both knees 06/03/2019  . Excessive daytime sleepiness 04/12/2019  . 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 Cerebral thrombosis with cerebral infarction ([place]) 04/12/2019  . Seasonal allergic rhinitis 03/11/2019  . Need for pneumococcal vaccination 03/11/2019  . Fracture of neck of femur ([place]) 01/13/2019  . AKI (acute kidney injury) ([place]) 01/08/2019  . Acute blood loss anemia 01/08/2019  . History of femur fracture 01/08/2019  . Need for  influenza vaccination 01/08/2019  . Edema of both legs 01/08/2019  . Hospital discharge follow-up 01/08/2019  . Closed left femoral fracture ([place]) 12/31/2018  . Diabetes mellitus type I ([place]) 12/17/2018  . Type 1 diabetes mellitus with stage 3 chronic kidney disease ([place]) 08/12/2018  . 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 Type 1 diabetes mellitus with hyperglycemia ([place]) 08/12/2018  . Vitamin D deficiency 07/15/2018  . Stage 3b chronic kidney disease ([place]) 06/16/2018  . Healthcare maintenance 06/15/2018  . History of CVA (cerebrovascular accident) 06/01/2018  . OSA (obstructive sleep apnea) 03/29/2015  . Stroke ([place])   . Hypothyroidism 01/19/2015  . Mixed hyperlipidemia 11/22/2014  . Ataxia   . Insulin dependent diabetes mellitus   . Intractable nausea and vomiting 07/27/2014  . Vertigo 07/27/2014  . Essential hypertension 07/27/2014  . Type 1 diabetes mellitus ([place]) 05/29/2010  . Hyperlipidemia 05/29/2010  . PLANTAR FASCIITIS 05/29/2010   Past Surgical History:  Procedure Laterality Date  . ANTERIOR APPROACH HEMI HIP ARTHROPLASTY Left 01/01/2019   Procedure: ANTERIOR APPROACH total hip ARTHROPLASTY;  Surgeon: Rod Can, MD;  Location: [place];  Service: Orthopedics;  Laterality: Left;  . CATARACT EXTRACTION Right 1994  . CATARACT EXTRACTION W/ INTRAOCULAR LENS IMPLANT  1994  . EYE SURGERY Right 1991   vitrectomy  . EYE SURGERY Right 1992   scar tissue removed from retina  . EYE SURGERY Right 1994   Cat Sx   Family History  Problem Relation Age of Onset  .  Hypertension Mother   . Hyperlipidemia Mother   . Hyperlipidemia Father   . Hypertension Father   . Diabetes Father   . Kidney disease Father        had a kidney transplant   . Stroke Brother   . Diabetes Brother   . Pulmonary fibrosis Paternal Grandmother   . Lung cancer Paternal Grandfather   . Diabetes Daughter   . Colon cancer Neg Hx   . Esophageal cancer Neg Hx   .  Rectal cancer Neg Hx   . Stomach cancer Neg Hx    Outpatient Medications Prior to Visit  Medication Sig Dispense Refill  . alendronate (FOSAMAX) 70 MG tablet Take 1 tablet (70 mg total) by mouth every 7 (seven) days. Take with a full glass of water on an empty stomach. 4 tablet 11  . amLODipine (NORVASC) 10 MG tablet Take 0.5 tablets (5 mg total) by mouth daily. 90 tablet 1  . atorvastatin (LIPITOR) 40 MG tablet TAKE 1 TABLET BY MOUTH ONCE DAILY 6 IN THE EVENING **PLEASE  KEEP  UPCOMING  APPT** 90 tablet 0  . Calcium Carbonate-Vitamin D 600-400 MG-UNIT tablet Take 1 tablet by mouth 2 (two) times daily.     . carvedilol (COREG) 25 MG tablet Take 25 mg by mouth 2 (two) times daily.    . cloNIDine (CATAPRES) 0.2 MG tablet Take 1 tablet by mouth twice daily 180 tablet 0  . clopidogrel (PLAVIX) 75 MG tablet TAKE 1 TABLET BY MOUTH ONCE DAILY -ASPIRIN  AND  PLAVIX  FOR  3  MONTHS,  AFTER  THAT-  STOP  ASPIRIN-  TAKE  PLAVIX  ALONE 90 tablet 3  . Continuous Blood Gluc Receiver (DEXCOM G6 RECEIVER) DEVI 1 Device by Does not apply route as directed. 1 Device 0  . Continuous Blood Gluc Sensor (DEXCOM G6 SENSOR) MISC 3 Devices by Does not apply route as directed. 3 each 6  . Continuous Blood Gluc Transmit (DEXCOM G6 TRANSMITTER) MISC 1 Device by Does not apply route as directed. 1 each 6  . furosemide (LASIX) 20 MG tablet Take 1 tablet (20 mg total) by mouth daily. 90 tablet 3  . Insulin Infusion Pump (T:SLIM INSULIN PUMP) DEVI by Does not apply route.    . insulin lispro (HUMALOG) 100 UNIT/ML injection Max daily dose of 100 units via pump DX E10.59 (vials) 90 mL 4  . lisinopril (ZESTRIL) 20 MG tablet Take 1 tablet by mouth once daily 90 tablet 0  . pantoprazole (PROTONIX) 40 MG tablet Take 1 tablet by mouth once daily 90 tablet 1  . tamsulosin (FLOMAX) 0.4 MG CAPS capsule 0.4 mg as directed    . calcium carbonate (OSCAL) 1500 (600 Ca) MG TABS tablet Take 1 tablet (1,500 mg total) by mouth 2 (two) times  daily with a meal. 180 tablet 5  . Calcium Carbonate-Vit D-Min (CALCIUM 600+D PLUS MINERALS) 600-400 MG-UNIT TABS Take by mouth.    . fluticasone (FLONASE) 50 MCG/ACT nasal spray Place 2 sprays into both nostrils daily.    . hydrALAZINE (APRESOLINE) 10 MG tablet 10 mg as directed    . spironolactone (ALDACTONE) 25 MG tablet Take by mouth.    Marland Kitchen aspirin EC 81 MG tablet Take 1 tablet (81 mg total) by mouth daily. (Patient not taking: Reported on 06/09/2020) 365 tablet 1  . Diclofenac Sodium (PENNSAID) 2 % SOLN Pennsaid 20 mg/gram/actuation (2 %) topical soln in metered-dose pump  APPLY 1 PUMP TOPICALLY TO THE AFFECTED AREA(S) TWICE  DAILY (Patient not taking: Reported on 06/09/2020)    . meclizine (ANTIVERT) 25 MG tablet 25 mg as directed (Patient not taking: Reported on 06/09/2020)     No facility-administered medications prior to visit.   No Known Allergies    Objective:   Today's Vitals   06/09/20 1509  BP: 120/66  Pulse: 81  Temp: 98.2 F (36.8 C)  TempSrc: Temporal  SpO2: 93%  Weight: 195 lb 6.4 oz (88.6 kg)   Body mass index is 30.6 kg/m.   General: Well developed, well nourished. No acute distress. Feet: Skin of both feet is shiny and thin with absence of hair. Pulse are 2+ bilat. There are no sores, though mildl   scaliness around some toes. Some nails are discolored yellow, thickened and fractured. Psych: Alert and oriented. Normal mood and affect.  Health Maintenance Due  Topic Date Due  . Hepatitis C Screening  Never done  . FOOT EXAM  10/27/2019     Assessment & Plan:   1. Type 1 diabetes mellitus with stage 3b chronic kidney disease ([place]) Managed by endocrinology. Will follow with them.  2. Essential hypertension Blood pressure doing very well today. Continue current therapy with amlodipine, clonidine, hydralazine, and lisinopril. We will check a BMP today to assess renal funciton and potassium level.  - hydrALAZINE (APRESOLINE) 10 MG tablet; Take 1 tablet (10 mg  total) by mouth daily.  Dispense: 30 tablet; Refill: 2 - spironolactone (ALDACTONE) 25 MG tablet; Take 0.5 tablets (12.5 mg total) by mouth daily.  Dispense: 15 tablet; Refill: 11 - Basic metabolic panel  3. Seasonal allergic rhinitis, unspecified trigger Stable on Flonase.  - fluticasone (FLONASE) 50 MCG/ACT nasal spray; Place 2 sprays into both nostrils daily.  Dispense: 15.8 mL; Refill: 11  Haydee Salter, MD

## 2020-06-26 ENCOUNTER — Ambulatory Visit: Payer: PPO

## 2020-06-26 NOTE — Progress Notes (Deleted)
Chronic Care Management Pharmacy Note  06/26/2020 Name:  Adrian Neal MRN:  [id number] DOB:  Oct 03, 1965  Subjective: Adrian Neal is an 55 y.o. year old male who is a primary patient of [name], MD.  The CCM team was consulted for assistance with disease management and care coordination needs.    Engaged with patient face to face for follow up visit in response to provider referral for pharmacy case management and/or care coordination services.   Consent to Services:  The patient was given information about Chronic Care Management services, agreed to services, and gave verbal consent prior to initiation of services.  Please see initial visit note for detailed documentation.   Patient Care Team: [name], MD as PCP - General (Family Medicine) Germaine Pomfret, [facility] as Pharmacist (Pharmacist)  Recent office visits: 06/09/20: Patient presented to Dr. Gena Fray for follow-up. Aspirin, meclizine, voltaren stopped. Patient started on hydralazine, spironolactone.   Recent consult visits: 05/23/20: Patient presented to Dr. Kelton Pillar (Endocrinology) for follow-up. A1c worsened to 6.9%. Carvedilol 25 mg twice daily started.   Hospital visits: {Hospital DC Yes/No:25215}  Objective:  Lab Results  Component Value Date   CREATININE 2.55 (H) 06/09/2020   BUN 44 (H) 06/09/2020   GFR 37.34 (L) 01/04/2020   GFRNONAA 36 (L) 04/08/2020   GFRAA 35 (L) 01/03/2019   NA 139 06/09/2020   K 4.6 06/09/2020   CALCIUM 8.8 06/09/2020   CO2 26 06/09/2020    Lab Results  Component Value Date/Time   HGBA1C 6.9 (A) 05/23/2020 09:47 AM   HGBA1C 6.0 (A) 11/16/2019 01:38 PM   HGBA1C 7.2 (H) 01/01/2019 04:34 AM   HGBA1C 7.7 (H) 01/20/2015 05:17 AM   GFR 37.34 (L) 01/04/2020 01:39 PM   GFR 36.93 (L) 09/06/2019 04:58 PM   MICROALBUR 28.4 (H) 10/28/2018 08:39 AM   MICROALBUR 71.1 (H) 06/15/2018 08:51 AM    Last diabetic Eye exam: No results found for: HMDIABEYEEXA   Last diabetic Foot exam: No results found for: HMDIABFOOTEX   Lab Results  Component Value Date   CHOL 107 10/28/2018   HDL 35.10 (L) 10/28/2018   LDLCALC 60 10/28/2018   LDLDIRECT 57.0 01/04/2020   TRIG 58.0 10/28/2018   CHOLHDL 3 10/28/2018    Hepatic Function Latest Ref Rng & Units 01/04/2020 01/01/2019 12/31/2018  Total Protein 6.0 - 8.3 g/dL 6.0 5.7(L) 6.6  Albumin 3.5 - 5.2 g/dL 3.8 3.2(L) 3.9  AST 0 - 37 U/L _0 ALT 0 - 53 U/L 18 31 40  Alk Phosphatase 39 - 117 U/L 81 98 117  Total Bilirubin 0.2 - 1.2 mg/dL 0.7 1.4(H) 0.9  Bilirubin, Direct 0.0 - 0.3 mg/dL 0.1 - -    Lab Results  Component Value Date/Time   TSH 3.18 01/04/2020 01:39 PM   TSH 4.36 06/03/2019 04:30 PM    CBC Latest Ref Rng & Units 04/08/2020 01/04/2020 01/08/2019  WBC 4.0 - 10.5 K/uL 6.0 5.1 8.9  Hemoglobin 13.0 - 17.0 g/dL 13.2 11.9(L) 7.9(L)  Hematocrit 39.0 - 52.0 % 39.8 34.5(L) 23.9(L)  Platelets 150 - 400 K/uL 133(L) 110.0(L) 195    Lab Results  Component Value Date/Time   VD25OH 39.41 06/03/2019 04:30 PM   VD25OH 29.55 (L) 01/26/2019 09:12 AM    Clinical ASCVD: {YES/NO:21197} The ASCVD Risk score Mikey Bussing DC Jr., et al., 2013) failed to calculate for the following reasons:   The patient has a prior MI or stroke diagnosis    Depression  screen [facility] 2/9 06/09/2020 11/25/2019 09/14/2019  Decreased Interest 0 0 0  Down, Depressed, Hopeless 0 0 0  PHQ - 2 Score 0 0 0  Altered sleeping - - -  Tired, decreased energy - - -  Change in appetite - - -  Feeling bad or failure about yourself  - - -  Trouble concentrating - - -  Moving slowly or fidgety/restless - - -  Suicidal thoughts - - -  PHQ-9 Score - - -     ***Other: (CHADS2VASc if Afib, MMRC or CAT for COPD, ACT, DEXA)  Social History   Tobacco Use  Smoking Status Former Smoker  . Packs/day: 1.00  . Years: 6.00  . Pack years: 6.00  . Quit date: 04/30/1991  . Years since quitting: 29.1  Smokeless Tobacco Never Used   BP Readings from  Last 3 Encounters:  06/09/20 120/66  05/23/20 140/74  04/08/20 (!) 181/76   Pulse Readings from Last 3 Encounters:  06/09/20 81  05/23/20 82  04/08/20 69   Wt Readings from Last 3 Encounters:  06/09/20 195 lb 6.4 oz (88.6 kg)  05/23/20 193 lb 4 oz (87.7 kg)  04/08/20 209 lb 7 oz (95 kg)    Assessment/Interventions: Review of patient past medical history, allergies, medications, health status, including review of consultants reports, laboratory and other test data, was performed as part of comprehensive evaluation and provision of chronic care management services.   SDOH:  (Social Determinants of Health) assessments and interventions performed: {yes/no:20286}   CCM Care Plan  No Known Allergies  Medications Reviewed Today    Reviewed by Haydee Salter, MD (Physician) on 06/09/20 at 1546  Med List Status: <None>  Medication Order Taking? Sig Documenting Provider Last Dose Status Informant  alendronate (FOSAMAX) 70 MG tablet [id number] Yes Take 1 tablet (70 mg total) by mouth every 7 (seven) days. Take with a full glass of water on an empty stomach. [name], MD Taking Active   amLODipine (NORVASC) 10 MG tablet [id number] Yes Take 0.5 tablets (5 mg total) by mouth daily. [name], MD Taking Expired 04/03/20 2359        Patient not taking:      Discontinued 06/09/20 1512 (Discontinued by provider)   atorvastatin (LIPITOR) 40 MG tablet [id number] Yes TAKE 1 TABLET BY MOUTH ONCE DAILY 6 IN THE EVENING **PLEASE  KEEP  UPCOMING  APPTKennyth Arnold, FNP Taking Active         Discontinued 24/26/83 4196 (Duplicate)         Discontinued 22/29/79 8921 (Duplicate)   Calcium Carbonate-Vitamin D 600-400 MG-UNIT tablet [id number] Yes Take 1 tablet by mouth 2 (two) times daily.  [provider] Taking Active   carvedilol (COREG) 25 MG tablet [id number] Yes Take 25 mg by mouth 2 (two) times daily. [provider] Taking Active   cloNIDine (CATAPRES)  0.2 MG tablet [id number] Yes Take 1 tablet by mouth twice daily Dutch Quint B, FNP Taking Active   clopidogrel (PLAVIX) 75 MG tablet [id number] Yes TAKE 1 TABLET BY MOUTH ONCE DAILY -ASPIRIN  AND  PLAVIX  FOR  3  MONTHS,  AFTER  THAT-  STOP  ASPIRIN-  TAKE  PLAVIX  ALONE [name], MD Taking Active   Continuous Blood Gluc Receiver ([place]) DEVI [id number] Yes 1 Device by Does not apply route as directed. Shamleffer, Melanie Crazier, MD Taking Active Self  Continuous Blood Gluc Sensor ([place]) [place] [id number] Yes  3 Devices by Does not apply route as directed. Shamleffer, Melanie Crazier, MD Taking Active Self  Continuous Blood Gluc Transmit (DEXCOM G6 TRANSMITTER) MISC [id number] Yes 1 Device by Does not apply route as directed. Shamleffer, Melanie Crazier, MD Taking Active Self       Patient not taking:      Discontinued 06/09/20 1540 (Discontinued by provider)   fluticasone (FLONASE) 50 MCG/ACT nasal spray [id number]  Place 2 sprays into both nostrils daily. Haydee Salter, MD  Active   furosemide (LASIX) 20 MG tablet [id number] Yes Take 1 tablet (20 mg total) by mouth daily. [name], MD Taking Active   hydrALAZINE (APRESOLINE) 10 MG tablet [id number]  Take 1 tablet (10 mg total) by mouth daily. Haydee Salter, MD  Active   Insulin Infusion Pump (T:SLIM INSULIN PUMP) DEVI [id number] Yes by Does not apply route. [provider] Taking Active   insulin lispro (HUMALOG) 100 UNIT/ML injection [id number] Yes Max daily dose of 100 units via pump DX E10.59 (vials) Philemon Kingdom, MD Taking Active Self  lisinopril (ZESTRIL) 20 MG tablet [id number] Yes Take 1 tablet by mouth once daily Kennyth Arnold, FNP Taking Active        Patient not taking:      Discontinued 06/09/20 1504 (Error)   pantoprazole (PROTONIX) 40 MG tablet [id number] Yes Take 1 tablet by mouth once daily [name], MD Taking Active   spironolactone (ALDACTONE) 25 MG tablet  [id number]  Take 0.5 tablets (12.5 mg total) by mouth daily. Haydee Salter, MD  Active   tamsulosin [facility]) 0.4 MG CAPS capsule [id number] Yes 0.4 mg as directed [provider] Taking Active           Patient Active Problem List   Diagnosis Date Noted  . Anemia 01/04/2020  . Medication side effect 01/04/2020  . Osteopenia 10/04/2019  . Patellofemoral pain syndrome of both knees 06/08/2019  . Pain in both knees 06/03/2019  . Excessive daytime sleepiness 04/12/2019  . 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 Cerebral thrombosis with cerebral infarction ([place]) 04/12/2019  . Seasonal allergic rhinitis 03/11/2019  . Need for pneumococcal vaccination 03/11/2019  . Fracture of neck of femur ([place]) 01/13/2019  . AKI (acute kidney injury) ([place]) 01/08/2019  . Acute blood loss anemia 01/08/2019  . History of femur fracture 01/08/2019  . Need for influenza vaccination 01/08/2019  . Edema of both legs 01/08/2019  . Hospital discharge follow-up 01/08/2019  . Closed left femoral fracture ([place]) 12/31/2018  . Diabetes mellitus type I ([place]) 12/17/2018  . 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 Type 1 diabetes mellitus with hyperglycemia ([place]) 08/12/2018  . Vitamin D deficiency 07/15/2018  . Stage 3b chronic kidney disease ([place]) 06/16/2018  . Healthcare maintenance 06/15/2018  . History of CVA (cerebrovascular accident) 06/01/2018  . OSA (obstructive sleep apnea) 03/29/2015  . Stroke ([place])   . Hypothyroidism 01/19/2015  . Mixed hyperlipidemia 11/22/2014  . Ataxia   . Insulin dependent diabetes mellitus   . Intractable nausea and vomiting 07/27/2014  . Vertigo 07/27/2014  . Essential hypertension 07/27/2014  . Type 1 diabetes mellitus ([place]) 05/29/2010  . Hyperlipidemia 05/29/2010  . PLANTAR FASCIITIS 05/29/2010    Immunization History  Administered Date(s)  Administered  . Influenza,inj,Quad PF,6+ Mos 07/15/2018, 01/08/2019  . Influenza-Unspecified 05/29/2020  . PFIZER Comirnaty([place])Covid-19 Tri-Sucrose Vaccine 07/09/2019, 07/29/2019, 05/29/2020  .  Pneumococcal Polysaccharide-23 03/02/2019  . Tdap 04/29/2017    Conditions to be addressed/monitored:  Hypertension, Hyperlipidemia, Diabetes, Chronic Kidney Disease and Osteopenia  There are no care plans that you recently modified to display for this patient.    Medication Assistance: {MEDASSISTANCEINFO:25044}  Patient's preferred pharmacy is:  [place], [place] - [place] [address] Phone: [phone] Fax: [phone]  [facility] [place], [place] - [address] [place] [address] Phone: [phone] Fax: [phone]  [facility] [place], [place] [address] Phone: [phone] Fax: [phone]  Uses pill box? {Yes or If no, why not?:20788} Pt endorses ***% compliance  We discussed: {Pharmacy options:24294} Patient decided to: {US Pharmacy Plan:23885}  Care Plan and Follow Up Patient Decision:  {FOLLOWUP:24991}  Plan: {CM FOLLOW UP PLAN:25073}  ***   Current Barriers:  . {pharmacybarriers:24917} . ***  Pharmacist Clinical Goal(s):  Marland Kitchen Over the next *** days, patient will {PHARMACYGOALCHOICES:24921} through collaboration with PharmD and provider.  . ***  Interventions: . 1:1 collaboration with [name], MD regarding development and update of comprehensive plan of care as evidenced by provider attestation and co-signature . Inter-disciplinary care team collaboration (see longitudinal plan of care) . Comprehensive medication review performed; medication list updated in electronic medical record  Hypertension (BP goal {CHL HP UPSTREAM Pharmacist  BP ranges:[phone]}) -{US controlled/uncontrolled:25276} -Current treatment: . Amlodipine 10 mg 1/2 tablet daily  . Carvedilol 25 mg twice daily  . Clonidine 0.2 mg twice daily  . Furosemide 20 mg daily  . Hydralazine 10 mg daily . Lisinopril 20 mg daily  . Spironolactone 25 mg 1/2 tablet daily  -Medications previously tried: ***  -Current home readings: *** -Current dietary habits: *** -Current exercise habits: *** -{ACTIONS;DENIES/REPORTS:21021675::"Denies"} hypotensive/hypertensive symptoms -Educated on {CCM BP Counseling:25124} -Counseled to monitor BP at home ***, document, and provide log at future appointments -{CCMPHARMDINTERVENTION:25122}  Hyperlipidemia: (LDL goal < ***) -{US controlled/uncontrolled:25276} -Current treatment: . Atorvastatin 40 mg daily  -Medications previously tried: ***  -Current dietary patterns: *** -Current exercise habits: *** -Educated on {CCM HLD Counseling:25126} -{CCMPHARMDINTERVENTION:25122}  Diabetes (A1c goal {A1c goals:23924}) -{US controlled/uncontrolled:25276} -Current medications: . Humalog via infusion pump -Medications previously tried: ***  -Current home glucose readings . fasting glucose: *** . post prandial glucose: *** -{ACTIONS;DENIES/REPORTS:21021675::"Denies"} hypoglycemic/hyperglycemic symptoms -Current meal patterns:  . breakfast: ***  . lunch: ***  . dinner: *** . snacks: *** . drinks: *** -Current exercise: *** -Educated on{CCM DM COUNSELING:25123} -Counseled to check feet daily and get yearly eye exams -{CCMPHARMDINTERVENTION:25122}  Osteoporosis / Osteopenia (Goal ***) -{US controlled/uncontrolled:25276} -Last DEXA Scan: 09/20/2019   T-Score femoral neck: -2.1  T-Score total hip: -1.5  T-Score lumbar spine: -1.2   T-Score forearm radius: NA  10-year probability of major osteoporotic fracture: NA  10-year probability of hip fracture: NA -Patient is a candidate for pharmacologic treatment due to history  of hip fracture  -Current treatment  . Alendronate 70 mg weekly (Started 10/04/19) . Calcium + vitamin D twice daily  -Medications previously tried: ***  -Borderline acceptable use of bisphosphonate given renal dysfunction (CrCl 35 mL/min). Needs close monitoring of renal function.  -{Osteoporosis Counseling:23892} -{CCMPHARMDINTERVENTION:25122}   Patient Goals/Self-Care Activities . Over the next *** days, patient will:  - {pharmacypatientgoals:24919}  Follow Up Plan: {CM FOLLOW UP RKVT:55217}

## 2020-06-30 DIAGNOSIS — E109 Type 1 diabetes mellitus without complications: Secondary | ICD-10-CM | POA: Diagnosis not present

## 2020-07-03 ENCOUNTER — Encounter: Payer: Self-pay | Admitting: Family Medicine

## 2020-07-03 ENCOUNTER — Ambulatory Visit (INDEPENDENT_AMBULATORY_CARE_PROVIDER_SITE_OTHER): Payer: PPO | Admitting: Family Medicine

## 2020-07-03 ENCOUNTER — Other Ambulatory Visit: Payer: Self-pay

## 2020-07-03 VITALS — BP 124/62 | HR 78 | Temp 98.5°F | Ht 67.0 in | Wt 197.2 lb

## 2020-07-03 DIAGNOSIS — I10 Essential (primary) hypertension: Secondary | ICD-10-CM | POA: Diagnosis not present

## 2020-07-03 DIAGNOSIS — E782 Mixed hyperlipidemia: Secondary | ICD-10-CM

## 2020-07-03 DIAGNOSIS — E1022 Type 1 diabetes mellitus with diabetic chronic kidney disease: Secondary | ICD-10-CM | POA: Diagnosis not present

## 2020-07-03 DIAGNOSIS — N184 Chronic kidney disease, stage 4 (severe): Secondary | ICD-10-CM

## 2020-07-03 DIAGNOSIS — D649 Anemia, unspecified: Secondary | ICD-10-CM

## 2020-07-03 LAB — CBC
HCT: 34.2 % — ABNORMAL LOW (ref 39.0–52.0)
Hemoglobin: 12 g/dL — ABNORMAL LOW (ref 13.0–17.0)
MCHC: 35 g/dL (ref 30.0–36.0)
MCV: 87.1 fl (ref 78.0–100.0)
Platelets: 134 10*3/uL — ABNORMAL LOW (ref 150.0–400.0)
RBC: 3.93 Mil/uL — ABNORMAL LOW (ref 4.22–5.81)
RDW: 13.7 % (ref 11.5–15.5)
WBC: 4.9 10*3/uL (ref 4.0–10.5)

## 2020-07-03 LAB — BASIC METABOLIC PANEL
BUN: 49 mg/dL — ABNORMAL HIGH (ref 6–23)
CO2: 25 mEq/L (ref 19–32)
Calcium: 8.6 mg/dL (ref 8.4–10.5)
Chloride: 109 mEq/L (ref 96–112)
Creatinine, Ser: 2.39 mg/dL — ABNORMAL HIGH (ref 0.40–1.50)
GFR: 29.97 mL/min — ABNORMAL LOW (ref >60.00)
Glucose, Bld: 163 mg/dL — ABNORMAL HIGH (ref 70–99)
Potassium: 4.6 mEq/L (ref 3.5–5.1)
Sodium: 141 mEq/L (ref 135–145)

## 2020-07-03 LAB — LDL CHOLESTEROL, DIRECT: Direct LDL: 57 mg/dL

## 2020-07-03 NOTE — Progress Notes (Addendum)
Established Patient Office Visit  Subjective:  Patient ID: Adrian Neal, male    DOB: 1965-12-28  Age: 55 y.o. MRN: [id number]  CC:  Chief Complaint  Patient presents with  . Follow-up    Follow up on medications patient states that he was prescribed a new medication from at doc at [facility] would like your opinion on this medication.     HPI Adrian Neal presents for follow-up of hypertension, hyperlipidemia and anemia.  Blood pressure currently under control.  Continues to see cardiology for this as well.  Low a month or so ago with acute right shoulder pain.  Blood pressure was elevated.  Proceeded to emergency room at [facility].  Spironolactone was added.  Shoulder pain has resolved.  [facility]  Past Medical History:  Diagnosis Date  . Cataract    Mixed form OS  . Chronic kidney disease   . Diabetes mellitus without complication ([place])    diagnosed at age 57  . Eye problems   . Heart murmur 1996  . High cholesterol    patient denies but take preventative medicine  . Hypertension   . Hypertensive retinopathy    OU  . Retinopathy due to secondary diabetes mellitus ([place])    PDR OU  . Sleep apnea    on BiPAP  . Stroke [facility]) 2016    Past Surgical History:  Procedure Laterality Date  . ANTERIOR APPROACH HEMI HIP ARTHROPLASTY Left 01/01/2019   Procedure: ANTERIOR APPROACH total hip ARTHROPLASTY;  Surgeon: Rod Can, MD;  Location: [facility];  Service: Orthopedics;  Laterality: Left;  . CATARACT EXTRACTION Right 1994  . CATARACT EXTRACTION W/ INTRAOCULAR LENS IMPLANT  1994  . EYE SURGERY Right 1991   vitrectomy  . EYE SURGERY Right 1992   scar tissue removed from retina  . EYE SURGERY Right 1994   Cat Sx    Family History  Problem Relation Age of Onset  . Hypertension Mother   . Hyperlipidemia Mother   . Hyperlipidemia Father   . Hypertension Father   . Diabetes Father   . Kidney disease Father         had a kidney transplant   . Stroke Brother   . Diabetes Brother   . Pulmonary fibrosis Paternal Grandmother   . Lung cancer Paternal Grandfather   . Diabetes Daughter   . Colon cancer Neg Hx   . Esophageal cancer Neg Hx   . Rectal cancer Neg Hx   . Stomach cancer Neg Hx     Social History   Socioeconomic History  . Marital status: Married    Spouse name: Not on file  . Number of children: 3  . Years of education: Not on file  . Highest education level: Not on file  Occupational History  . Occupation: Disabled  Tobacco Use  . Smoking status: Former Smoker    Packs/day: 1.00    Years: 6.00    Pack years: 6.00    Quit date: 04/30/1991    Years since quitting: 29.2  . Smokeless tobacco: Never Used  Vaping Use  . Vaping Use: Never used  Substance and Sexual Activity  . Alcohol use: No  . Drug use: Never  . Sexual activity: Not on file  Other Topics Concern  . Not on file  Social History Narrative   Right handed   Lives wife and kids in a two story home   Social Determinants  of Health   Financial Resource Strain: Low Risk   . Difficulty of Paying Living Expenses: Not hard at all  Food Insecurity: Not on file  Transportation Needs: No Transportation Needs  . Lack of Transportation (Medical): No  . Lack of Transportation (Non-Medical): No  Physical Activity: Not on file  Stress: Not on file  Social Connections: Not on file  Intimate Partner Violence: Not on file    Outpatient Medications Prior to Visit  Medication Sig Dispense Refill  . alendronate (FOSAMAX) 70 MG tablet Take 1 tablet (70 mg total) by mouth every 7 (seven) days. Take with a full glass of water on an empty stomach. 4 tablet 11  . amLODipine (NORVASC) 10 MG tablet Take 0.5 tablets (5 mg total) by mouth daily. 90 tablet 1  . atorvastatin (LIPITOR) 40 MG tablet TAKE 1 TABLET BY MOUTH ONCE DAILY 6 IN THE EVENING **PLEASE  KEEP  UPCOMING  APPT** 90 tablet 0  . Calcium Carbonate-Vitamin D 600-400  MG-UNIT tablet Take 1 tablet by mouth 2 (two) times daily.     . carvedilol (COREG) 25 MG tablet Take 25 mg by mouth 2 (two) times daily.    . cloNIDine (CATAPRES) 0.2 MG tablet Take 1 tablet by mouth twice daily 180 tablet 0  . clopidogrel (PLAVIX) 75 MG tablet TAKE 1 TABLET BY MOUTH ONCE DAILY -ASPIRIN  AND  PLAVIX  FOR  3  MONTHS,  AFTER  THAT-  STOP  ASPIRIN-  TAKE  PLAVIX  ALONE 90 tablet 3  . Continuous Blood Gluc Receiver (DEXCOM G6 RECEIVER) DEVI 1 Device by Does not apply route as directed. 1 Device 0  . Continuous Blood Gluc Sensor (DEXCOM G6 SENSOR) MISC 3 Devices by Does not apply route as directed. 3 each 6  . Continuous Blood Gluc Transmit (DEXCOM G6 TRANSMITTER) MISC 1 Device by Does not apply route as directed. 1 each 6  . fluticasone (FLONASE) 50 MCG/ACT nasal spray Place 2 sprays into both nostrils daily. 15.8 mL 11  . furosemide (LASIX) 20 MG tablet Take 1 tablet (20 mg total) by mouth daily. 90 tablet 3  . hydrALAZINE (APRESOLINE) 10 MG tablet Take 1 tablet (10 mg total) by mouth daily. 30 tablet 2  . Insulin Infusion Pump (T:SLIM INSULIN PUMP) DEVI by Does not apply route.    . insulin lispro (HUMALOG) 100 UNIT/ML injection Max daily dose of 100 units via pump DX E10.59 (vials) 90 mL 4  . lisinopril (ZESTRIL) 20 MG tablet Take 1 tablet by mouth once daily 90 tablet 0  . pantoprazole (PROTONIX) 40 MG tablet Take 1 tablet by mouth once daily 90 tablet 1  . spironolactone (ALDACTONE) 25 MG tablet Take 0.5 tablets (12.5 mg total) by mouth daily. 15 tablet 11  . tamsulosin (FLOMAX) 0.4 MG CAPS capsule 0.4 mg as directed (Patient not taking: Reported on 07/03/2020)     No facility-administered medications prior to visit.    No Known Allergies  ROS Review of Systems  Constitutional: Negative.   HENT: Positive for congestion. Negative for postnasal drip and rhinorrhea.   Eyes: Negative for photophobia and visual disturbance.  Respiratory: Negative.   Cardiovascular: Negative  for palpitations and leg swelling.  Gastrointestinal: Negative.   Endocrine: Negative for polyphagia and polyuria.  Genitourinary: Negative.   Musculoskeletal: Negative.   Neurological: Negative for light-headedness and headaches.  Hematological: Does not bruise/bleed easily.  Psychiatric/Behavioral: Negative.       Objective:    Physical Exam Vitals and  nursing note reviewed.  Constitutional:      General: He is not in acute distress.    Appearance: Normal appearance. He is not ill-appearing, toxic-appearing or diaphoretic.  HENT:     Head: Normocephalic and atraumatic.     Right Ear: Tympanic membrane, ear canal and external ear normal.     Left Ear: Tympanic membrane, ear canal and external ear normal.     Mouth/Throat:     Mouth: Mucous membranes are dry.     Pharynx: Oropharynx is clear.  Eyes:     General: No scleral icterus.       Right eye: No discharge.        Left eye: No discharge.     Extraocular Movements: Extraocular movements intact.     Conjunctiva/sclera: Conjunctivae normal.     Pupils: Pupils are equal, round, and reactive to light.  Cardiovascular:     Rate and Rhythm: Normal rate and regular rhythm.     Pulses: Normal pulses.     Heart sounds: Normal heart sounds.  Pulmonary:     Effort: Pulmonary effort is normal.     Breath sounds: Normal breath sounds.  Abdominal:     General: Bowel sounds are normal.  Skin:    General: Skin is warm and dry.  Neurological:     Mental Status: He is alert and oriented to person, place, and time.     Comments: Negative [place].   Psychiatric:        Mood and Affect: Mood normal.        Behavior: Behavior normal.     BP 124/62   Pulse 78   Temp 98.5 F (36.9 C) (Temporal)   Ht 5\' 7"  (1.702 m)   Wt 197 lb 3.2 oz (89.4 kg)   SpO2 97%   BMI 30.89 kg/m  Wt Readings from Last 3 Encounters:  07/03/20 197 lb 3.2 oz (89.4 kg)  06/09/20 195 lb 6.4 oz (88.6 kg)  05/23/20 193 lb 4 oz (87.7 kg)     Health  Maintenance Due  Topic Date Due  . Hepatitis C Screening  Never done    There are no preventive care reminders to display for this patient.  Lab Results  Component Value Date   TSH 3.18 01/04/2020   Lab Results  Component Value Date   WBC 4.9 07/03/2020   HGB 12.0 (L) 07/03/2020   HCT 34.2 (L) 07/03/2020   MCV 87.1 07/03/2020   PLT 134.0 (L) 07/03/2020   Lab Results  Component Value Date   NA 141 07/03/2020   K 4.6 07/03/2020   CO2 25 07/03/2020   GLUCOSE 163 (H) 07/03/2020   BUN 49 (H) 07/03/2020   CREATININE 2.39 (H) 07/03/2020   BILITOT 0.7 01/04/2020   ALKPHOS 81 01/04/2020   AST 17 01/04/2020   ALT 18 01/04/2020   PROT 6.0 01/04/2020   ALBUMIN 3.8 01/04/2020   CALCIUM 8.6 07/03/2020   ANIONGAP 10 04/08/2020   GFR 29.97 (L) 07/03/2020   Lab Results  Component Value Date   CHOL 107 10/28/2018   Lab Results  Component Value Date   HDL 35.10 (L) 10/28/2018   Lab Results  Component Value Date   LDLCALC 60 10/28/2018   Lab Results  Component Value Date   TRIG 58.0 10/28/2018   Lab Results  Component Value Date   CHOLHDL 3 10/28/2018   Lab Results  Component Value Date   HGBA1C 6.9 (A) 05/23/2020  Assessment & Plan:   Problem List Items Addressed This Visit      Cardiovascular and Mediastinum   Essential hypertension - Primary   Relevant Orders   Basic metabolic panel (Completed)     Endocrine   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   Follow-up: Return Return for physical exam, fasting if possible..  We discussed blood pressure and diabetes control is too important ways to help his chronic kidney disease.  Will return fasting for a complete  physical.  He will continue his exercise routine. Renal function continues decline. Now at stage 4.   [name], MD

## 2020-07-04 ENCOUNTER — Encounter: Payer: Self-pay | Admitting: Family Medicine

## 2020-07-04 NOTE — Addendum Note (Signed)
Addended by: Jon Billings on: 07/04/2020 09:49 AM   Modules accepted: Orders

## 2020-07-07 ENCOUNTER — Other Ambulatory Visit: Payer: Self-pay | Admitting: Family Medicine

## 2020-07-07 ENCOUNTER — Other Ambulatory Visit: Payer: Self-pay | Admitting: Family

## 2020-07-07 DIAGNOSIS — E782 Mixed hyperlipidemia: Secondary | ICD-10-CM

## 2020-07-07 DIAGNOSIS — I10 Essential (primary) hypertension: Secondary | ICD-10-CM

## 2020-07-07 DIAGNOSIS — R600 Localized edema: Secondary | ICD-10-CM

## 2020-07-11 DIAGNOSIS — Z8673 Personal history of transient ischemic attack (TIA), and cerebral infarction without residual deficits: Secondary | ICD-10-CM | POA: Diagnosis not present

## 2020-07-11 DIAGNOSIS — N184 Chronic kidney disease, stage 4 (severe): Secondary | ICD-10-CM | POA: Diagnosis not present

## 2020-07-11 DIAGNOSIS — E1022 Type 1 diabetes mellitus with diabetic chronic kidney disease: Secondary | ICD-10-CM | POA: Diagnosis not present

## 2020-07-11 DIAGNOSIS — D631 Anemia in chronic kidney disease: Secondary | ICD-10-CM | POA: Diagnosis not present

## 2020-07-11 DIAGNOSIS — I129 Hypertensive chronic kidney disease with stage 1 through stage 4 chronic kidney disease, or unspecified chronic kidney disease: Secondary | ICD-10-CM | POA: Diagnosis not present

## 2020-07-13 DIAGNOSIS — G4733 Obstructive sleep apnea (adult) (pediatric): Secondary | ICD-10-CM | POA: Insufficient documentation

## 2020-07-13 DIAGNOSIS — N1831 Chronic kidney disease, stage 3a: Secondary | ICD-10-CM | POA: Diagnosis not present

## 2020-07-13 DIAGNOSIS — I10 Essential (primary) hypertension: Secondary | ICD-10-CM | POA: Diagnosis not present

## 2020-07-13 DIAGNOSIS — I6381 Other cerebral infarction due to occlusion or stenosis of small artery: Secondary | ICD-10-CM | POA: Diagnosis not present

## 2020-07-20 ENCOUNTER — Other Ambulatory Visit: Payer: Self-pay | Admitting: Family

## 2020-07-20 DIAGNOSIS — N179 Acute kidney failure, unspecified: Secondary | ICD-10-CM

## 2020-07-20 DIAGNOSIS — I10 Essential (primary) hypertension: Secondary | ICD-10-CM

## 2020-07-31 DIAGNOSIS — E109 Type 1 diabetes mellitus without complications: Secondary | ICD-10-CM | POA: Diagnosis not present

## 2020-08-11 ENCOUNTER — Other Ambulatory Visit: Payer: Self-pay | Admitting: Family Medicine

## 2020-08-21 DIAGNOSIS — E109 Type 1 diabetes mellitus without complications: Secondary | ICD-10-CM | POA: Diagnosis not present

## 2020-08-22 ENCOUNTER — Telehealth: Payer: Self-pay | Admitting: Internal Medicine

## 2020-08-22 ENCOUNTER — Ambulatory Visit: Payer: PPO | Admitting: Internal Medicine

## 2020-08-22 NOTE — Telephone Encounter (Signed)
Jarrett Soho from [facility] care calling regarding diabetes supplies for pt.  Fax was sent over on  08/15/2020 and 08/17/2020.

## 2020-08-23 NOTE — Telephone Encounter (Signed)
This was faxed on Monday 08/21/2020. Will refax today 08/23/2020.

## 2020-08-24 ENCOUNTER — Telehealth: Payer: Self-pay

## 2020-08-24 NOTE — Progress Notes (Signed)
    Chronic Care Management Pharmacy Assistant   Name: Adrian Neal  MRN: [id number] DOB: 03-05-1966 Reason for Encounter: Medication Review/General Adherence   Recent office visits:  07/03/2020 Dr.Kremer MD (PCP) 06/09/2020 Dr. Gena Fray MD (PCP Office)   Recent consult visits:  07/13/2020 Magdalene Patricia NP (Cardiology)   Hospital visits:  None in previous 6 months  Medications: Outpatient Encounter Medications as of 08/24/2020  Medication Sig  . alendronate (FOSAMAX) 70 MG tablet Take 1 tablet (70 mg total) by mouth every 7 (seven) days. Take with a full glass of water on an empty stomach.  Marland Kitchen amLODipine (NORVASC) 10 MG tablet Take 0.5 tablets (5 mg total) by mouth daily.  Marland Kitchen atorvastatin (LIPITOR) 40 MG tablet TAKE 1 TABLET BY MOUTH ONCE DAILY AT  6PM  . Calcium Carbonate-Vitamin D 600-400 MG-UNIT tablet Take 1 tablet by mouth 2 (two) times daily.   . carvedilol (COREG) 25 MG tablet Take 25 mg by mouth 2 (two) times daily.  . cloNIDine (CATAPRES) 0.2 MG tablet Take 1 tablet by mouth twice daily  . clopidogrel (PLAVIX) 75 MG tablet TAKE 1 TABLET BY MOUTH ONCE DAILY -ASPIRIN  AND  PLAVIX  FOR  3  MONTHS,  AFTER  THAT-  STOP  ASPIRIN-  TAKE  PLAVIX  ALONE  . Continuous Blood Gluc Receiver (DEXCOM G6 RECEIVER) DEVI 1 Device by Does not apply route as directed.  . Continuous Blood Gluc Sensor (DEXCOM G6 SENSOR) MISC 3 Devices by Does not apply route as directed.  . Continuous Blood Gluc Transmit (DEXCOM G6 TRANSMITTER) MISC 1 Device by Does not apply route as directed.  . fluticasone (FLONASE) 50 MCG/ACT nasal spray Place 2 sprays into both nostrils daily.  . furosemide (LASIX) 20 MG tablet Take 1 tablet by mouth once daily  . hydrALAZINE (APRESOLINE) 10 MG tablet Take 1 tablet (10 mg total) by mouth daily.  . Insulin Infusion Pump (T:SLIM INSULIN PUMP) DEVI by Does not apply route.  . insulin lispro (HUMALOG) 100 UNIT/ML injection Max daily dose of 100 units via pump DX E10.59 (vials)   . lisinopril (ZESTRIL) 20 MG tablet Take 1 tablet by mouth once daily  . pantoprazole (PROTONIX) 40 MG tablet Take 1 tablet by mouth once daily  . spironolactone (ALDACTONE) 25 MG tablet Take 0.5 tablets (12.5 mg total) by mouth daily.  . tamsulosin (FLOMAX) 0.4 MG CAPS capsule 0.4 mg as directed (Patient not taking: Reported on 07/03/2020)   No facility-administered encounter medications on file as of 08/24/2020.   Star Rating Drugs: Atorvastatin 40 mg last filled on 07/12/2020 for 90 day supply at [facility] Lisinopril 20 mg last filled on 07/21/2020 for 90 day supply at [facility].  Called patient and discussed medication adherence  with patient, no issues at this time with current medication.   Patient denies ED visit since his last CPP follow up.  Patient denies any side effects with his medication. Patient denies any problems with his current pharmacy  Bessie Dundee Pharmacist Assistant [phone]

## 2020-08-30 DIAGNOSIS — E109 Type 1 diabetes mellitus without complications: Secondary | ICD-10-CM | POA: Diagnosis not present

## 2020-09-05 ENCOUNTER — Ambulatory Visit: Payer: PPO | Admitting: Internal Medicine

## 2020-09-11 ENCOUNTER — Other Ambulatory Visit: Payer: Self-pay | Admitting: Family Medicine

## 2020-09-11 DIAGNOSIS — I10 Essential (primary) hypertension: Secondary | ICD-10-CM

## 2020-09-12 ENCOUNTER — Ambulatory Visit (INDEPENDENT_AMBULATORY_CARE_PROVIDER_SITE_OTHER): Payer: PPO

## 2020-09-12 VITALS — Ht 67.0 in | Wt 197.0 lb

## 2020-09-12 DIAGNOSIS — Z0000 Encounter for general adult medical examination without abnormal findings: Secondary | ICD-10-CM | POA: Diagnosis not present

## 2020-09-12 NOTE — Progress Notes (Signed)
Subjective:   Adrian Neal is a 55 y.o. male who presents for Medicare Annual/Subsequent preventive examination.  I connected with [place] today by telephone and verified that I am speaking with the correct person using two identifiers. Location patient: home Location provider: work Persons participating in the virtual visit: patient, Marine scientist.    I discussed the limitations, risks, security and privacy concerns of performing an evaluation and management service by telephone and the availability of in person appointments. I also discussed with the patient that there may be a patient responsible charge related to this service. The patient expressed understanding and verbally consented to this telephonic visit.    Interactive audio and video telecommunications were attempted between this provider and patient, however failed, due to patient having technical difficulties OR patient did not have access to video capability.  We continued and completed visit with audio only.  Some vital signs may be absent or patient reported.   Time Spent with patient on telephone encounter: 25 minutes   Review of Systems     Cardiac Risk Factors include: diabetes mellitus;hypertension;dyslipidemia;male gender;obesity (BMI >30kg/m2)     Objective:    Today's Vitals   09/12/20 1246  Weight: 197 lb (89.4 kg)  Height: 5\' 7"  (1.702 m)   Body mass index is 30.85 kg/m.  Advanced Directives 09/12/2020 04/08/2020 11/25/2019 09/14/2019 06/02/2019 01/01/2019 12/31/2018  Does Patient Have a Medical Advance Directive? No No No No No No No  Would patient like information on creating a medical advance directive? Yes (MAU/Ambulatory/Procedural Areas - Information given) - Yes (MAU/Ambulatory/Procedural Areas - Information given) - No - Patient declined No - Patient declined No - Patient declined    Current Medications (verified) Outpatient Encounter Medications as of 09/12/2020  Medication Sig  . alendronate (FOSAMAX) 70  MG tablet Take 1 tablet (70 mg total) by mouth every 7 (seven) days. Take with a full glass of water on an empty stomach.  Marland Kitchen atorvastatin (LIPITOR) 40 MG tablet TAKE 1 TABLET BY MOUTH ONCE DAILY AT  6PM  . Calcium Carbonate-Vitamin D 600-400 MG-UNIT tablet Take 1 tablet by mouth 2 (two) times daily.   . carvedilol (COREG) 25 MG tablet Take 25 mg by mouth 2 (two) times daily.  . cloNIDine (CATAPRES) 0.2 MG tablet Take 1 tablet by mouth twice daily  . clopidogrel (PLAVIX) 75 MG tablet TAKE 1 TABLET BY MOUTH ONCE DAILY -ASPIRIN  AND  PLAVIX  FOR  3  MONTHS,  AFTER  THAT-  STOP  ASPIRIN-  TAKE  PLAVIX  ALONE  . Continuous Blood Gluc Receiver (DEXCOM G6 RECEIVER) DEVI 1 Device by Does not apply route as directed.  . Continuous Blood Gluc Sensor (DEXCOM G6 SENSOR) MISC 3 Devices by Does not apply route as directed.  . Continuous Blood Gluc Transmit (DEXCOM G6 TRANSMITTER) MISC 1 Device by Does not apply route as directed.  . furosemide (LASIX) 20 MG tablet Take 1 tablet by mouth once daily  . hydrALAZINE (APRESOLINE) 10 MG tablet Take 1 tablet by mouth once daily  . Insulin Infusion Pump (T:SLIM INSULIN PUMP) DEVI by Does not apply route.  . insulin lispro (HUMALOG) 100 UNIT/ML injection Max daily dose of 100 units via pump DX E10.59 (vials)  . lisinopril (ZESTRIL) 20 MG tablet Take 1 tablet by mouth once daily  . pantoprazole (PROTONIX) 40 MG tablet Take 1 tablet by mouth once daily  . spironolactone (ALDACTONE) 25 MG tablet Take 0.5 tablets (12.5 mg total) by mouth daily.  Marland Kitchen  amLODipine (NORVASC) 10 MG tablet Take 0.5 tablets (5 mg total) by mouth daily.  . fluticasone (FLONASE) 50 MCG/ACT nasal spray Place 2 sprays into both nostrils daily.  . tamsulosin (FLOMAX) 0.4 MG CAPS capsule 0.4 mg as directed (Patient not taking: No sig reported)   No facility-administered encounter medications on file as of 09/12/2020.    Allergies (verified) Patient has no known allergies.   History: Past Medical  History:  Diagnosis Date  . Cataract    Mixed form OS  . Chronic kidney disease   . Diabetes mellitus without complication ([place])    diagnosed at age 25  . Eye problems   . Heart murmur 1996  . High cholesterol    patient denies but take preventative medicine  . Hypertension   . Hypertensive retinopathy    OU  . 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 Sleep apnea    on BiPAP  . Stroke [facility]) 2016   Past Surgical History:  Procedure Laterality Date  . ANTERIOR APPROACH HEMI HIP ARTHROPLASTY Left 01/01/2019   Procedure: ANTERIOR APPROACH total hip ARTHROPLASTY;  Surgeon: Rod Can, MD;  Location: [facility];  Service: Orthopedics;  Laterality: Left;  . CATARACT EXTRACTION Right 1994  . CATARACT EXTRACTION W/ INTRAOCULAR LENS IMPLANT  1994  . EYE SURGERY Right 1991   vitrectomy  . EYE SURGERY Right 1992   scar tissue removed from retina  . EYE SURGERY Right 1994   Cat Sx   Family History  Problem Relation Age of Onset  . Hypertension Mother   . Hyperlipidemia Mother   . Hyperlipidemia Father   . Hypertension Father   . Diabetes Father   . Kidney disease Father        had a kidney transplant   . Stroke Brother   . Diabetes Brother   . Pulmonary fibrosis Paternal Grandmother   . Lung cancer Paternal Grandfather   . Diabetes Daughter   . Colon cancer Neg Hx   . Esophageal cancer Neg Hx   . Rectal cancer Neg Hx   . Stomach cancer Neg Hx    Social History   Socioeconomic History  . Marital status: Married    Spouse name: Not on file  . Number of children: 3  . Years of education: Not on file  . Highest education level: Not on file  Occupational History  . Occupation: Disabled  Tobacco Use  . Smoking status: Former Smoker    Packs/day: 1.00    Years: 6.00    Pack years: 6.00    Quit date: 04/30/1991    Years since quitting: 29.3  . Smokeless tobacco: Never Used  Vaping Use  . Vaping Use: Never used  Substance and Sexual Activity  .  Alcohol use: No  . Drug use: Never  . Sexual activity: Not on file  Other Topics Concern  . Not on file  Social History Narrative   Right handed   Lives wife and kids in a two story home   Social Determinants of Health   Financial Resource Strain: Low Risk   . Difficulty of Paying Living Expenses: Not hard at all  Food Insecurity: No Food Insecurity  . Worried About Charity fundraiser in the Last Year: Never true  . Ran Out of Food in the Last Year: Never true  Transportation Needs: No Transportation Needs  . Lack of Transportation (Medical): No  . Lack of Transportation (Non-Medical): No  Physical Activity: Insufficiently Active  . Days of Exercise per Week: 2 days  . Minutes of Exercise per Session: 60 min  Stress: No Stress Concern Present  . Feeling of Stress : Not at all  Social Connections: Socially Integrated  . Frequency of Communication with Friends and Family: More than three times a week  . Frequency of Social Gatherings with Friends and Family: More than three times a week  . Attends Religious Services: More than 4 times per year  . Active Member of Clubs or Organizations: Yes  . Attends Archivist Meetings: More than 4 times per year  . Marital Status: Married    Tobacco Counseling Counseling given: Not Answered   Clinical Intake:  Pre-visit preparation completed: Yes  Pain : No/denies pain     Nutritional Status: BMI > 30  Obese Nutritional Risks: None Diabetes: Yes CBG done?: No Did pt. bring in CBG monitor from home?: No  How often do you need to have someone help you when you read instructions, pamphlets, or other written materials from your doctor or pharmacy?: 1 - Never  Diabetes:  Is the patient diabetic?  Yes  If diabetic, was a CBG obtained today?  No  Did the patient bring in their glucometer from home?  No phone visit How often do you monitor your CBG's? Continuous glucose monitor.   Financial Strains and Diabetes  Management:  Are you having any financial strains with the device, your supplies or your medication? No .  Does the patient want to be seen by Chronic Care Management for management of their diabetes?  No  Would the patient like to be referred to a Nutritionist or for Diabetic Management?  No   Diabetic Exams:  Diabetic Eye Exam: Completed 03/2020.  Diabetic Foot Exam: Completed 06/09/2020.    Interpreter Needed?: No  Information entered by :: Caroleen Hamman LPN   Activities of Daily Living In your present state of health, do you have any difficulty performing the following activities: 09/12/2020  Hearing? N  Vision? N  Difficulty concentrating or making decisions? N  Walking or climbing stairs? N  Dressing or bathing? N  Doing errands, shopping? N  Preparing Food and eating ? N  Using the Toilet? N  In the past six months, have you accidently leaked urine? N  Do you have problems with loss of bowel control? N  Managing your Medications? N  Managing your Finances? N  Housekeeping or managing your Housekeeping? N  Some recent data might be hidden    Patient Care Team: [name], MD as PCP - General (Family Medicine) Germaine Pomfret, [facility] as Pharmacist (Pharmacist)  Indicate any recent Medical Services you may have received from other than [facility] providers in the past year (date may be approximate).     Assessment:   This is a routine wellness examination for [place].  Hearing/Vision screen  Hearing Screening   125Hz  250Hz  500Hz  1000Hz  2000Hz  3000Hz  4000Hz  6000Hz  8000Hz   Right ear:           Left ear:           Comments: No issues  Vision Screening Comments: Last eye exam-03/2020  Dietary issues and exercise activities discussed: Current Exercise Habits: Home exercise routine, Type of exercise: Other - see comments (balance class), Time (Minutes): 60, Frequency (Times/Week): 2, Weekly Exercise (Minutes/Week): 120, Intensity: Mild, Exercise limited by:  None identified  Goals Addressed  This Visit's Progress   . Patient Stated       Stop drinking soda      Depression Screen PHQ 2/9 Scores 09/12/2020 06/09/2020 11/25/2019 09/14/2019 06/03/2019 06/02/2019 08/16/2015  PHQ - 2 Score 0 0 0 0 0 0 0  PHQ- 9 Score - - - - - - -    Fall Risk Fall Risk  09/12/2020 06/09/2020 11/25/2019 09/14/2019 06/03/2019  Falls in the past year? 0 0 1 1 1   Number falls in past yr: 0 - 0 0 0  Injury with Fall? 0 - 1 1 1   Comment - - broke hip - broken left hip  Risk for fall due to : - - - - -  Follow up Falls prevention discussed - - - -    FALL RISK PREVENTION PERTAINING TO THE HOME:  Any stairs in or around the home? Yes  If so, are there any without handrails? No  Home free of loose throw rugs in walkways, pet beds, electrical cords, etc? Yes  Adequate lighting in your home to reduce risk of falls? Yes   ASSISTIVE DEVICES UTILIZED TO PREVENT FALLS:  Life alert? No  Use of a cane, walker or w/c? Yes  Grab bars in the bathroom? Yes  Shower chair or bench in shower? No  Elevated toilet seat or a handicapped toilet? No   TIMED UP AND GO:  Was the test performed? No . Phone viist   Cognitive Function:Normal cognitive status assessed by  this Nurse Health Advisor. No abnormalities found.          Immunizations Immunization History  Administered Date(s) Administered  . Influenza,inj,Quad PF,6+ Mos 07/15/2018, 01/08/2019  . Influenza-Unspecified 05/29/2020  . PFIZER Comirnaty(Gray Top)Covid-19 Tri-Sucrose Vaccine 07/09/2019, 07/29/2019, 05/29/2020  . Pneumococcal Polysaccharide-23 03/02/2019  . Tdap 04/29/2017    TDAP status: Up to date  Flu Vaccine status: Up to date  Pneumococcal vaccine status: Up to date  Covid-19 vaccine status: Completed vaccines  Qualifies for Shingles Vaccine? Yes   Zostavax completed No   Shingrix Completed?: No.    Education has been provided regarding the importance of this vaccine. Patient has  been advised to call insurance company to determine out of pocket expense if they have not yet received this vaccine. Advised may also receive vaccine at local pharmacy or Health Dept. Verbalized acceptance and understanding.  Screening Tests Health Maintenance  Topic Date Due  . Hepatitis C Screening  Never done  . OPHTHALMOLOGY EXAM  07/13/2020  . HEMOGLOBIN A1C  11/20/2020  . INFLUENZA VACCINE  11/27/2020  . FOOT EXAM  06/09/2021  . COLONOSCOPY (Pts 45-33yrs Insurance coverage will need to be confirmed)  07/01/2025  . TETANUS/TDAP  04/30/2027  . PNEUMOCOCCAL POLYSACCHARIDE VACCINE AGE 43-64 HIGH RISK  Completed  . COVID-19 Vaccine  Completed  . HIV Screening  Completed  . HPV VACCINES  Aged Out    Health Maintenance  Health Maintenance Due  Topic Date Due  . Hepatitis C Screening  Never done  . OPHTHALMOLOGY EXAM  07/13/2020    Colorectal cancer screening: Type of screening: Colonoscopy. Completed 07/02/2018. Repeat every 7 years  Lung Cancer Screening: (Low Dose CT Chest recommended if Age 63-80 years, 30 pack-year currently smoking OR have quit w/in 15years.) does not qualify.     Additional Screening:  Hepatitis C Screening: does not qualify  Vision Screening: Recommended annual ophthalmology exams for early detection of glaucoma and other disorders of the eye. Is the patient up to date  with their annual eye exam?  Yes  Who is the provider or what is the name of the office in which the patient attends annual eye exams? Dr. Coralyn Pear   Dental Screening: Recommended annual dental exams for proper oral hygiene  Community Resource Referral / Chronic Care Management: CRR required this visit?  No   CCM required this visit?  No      Plan:     I have personally reviewed and noted the following in the patient's chart:   . Medical and social history . Use of alcohol, tobacco or illicit drugs  . Current medications and supplements including opioid prescriptions. Patient  is not currently taking opioid prescriptions. . Functional ability and status . Nutritional status . Physical activity . Advanced directives . List of other physicians . Hospitalizations, surgeries, and ER visits in previous 12 months . Vitals . Screenings to include cognitive, depression, and falls . Referrals and appointments  In addition, I have reviewed and discussed with patient certain preventive protocols, quality metrics, and best practice recommendations. A written personalized care plan for preventive services as well as general preventive health recommendations were provided to patient.   Due to this being a telephonic visit, the after visit summary with patients personalized plan was offered to patient via mail or my-chart. Patient would like to access on my-chart.   Marta Antu, LPN   [id number]  Nurse Health Advisor  Nurse Notes: None

## 2020-09-12 NOTE — Patient Instructions (Signed)
Adrian Neal , Thank you for taking time to complete your Medicare Wellness Visit. I appreciate your ongoing commitment to your health goals. Please review the following plan we discussed and let me know if I can assist you in the future.   Screening recommendations/referrals: Colonoscopy: Completed 07/02/2018-Due 07/01/2025 Recommended yearly ophthalmology/optometry visit for glaucoma screening and checkup Recommended yearly dental visit for hygiene and checkup  Vaccinations: Influenza vaccine: Up to date Pneumococcal vaccine: Up to date Tdap vaccine: Up to date-Due-04/30/2027 Shingles vaccine: Discuss with pharmacy Covid-19: Up to date  Advanced directives: Information mailed today  Conditions/risks identified: See problem list  Next appointment: Follow up in one year for your annual wellness visit 09/18/2021 @ 2:15  Preventive Care 40-64 Years, Male Preventive care refers to lifestyle choices and visits with your health care provider that can promote health and wellness. What does preventive care include?  A yearly physical exam. This is also called an annual well check.  Dental exams once or twice a year.  Routine eye exams. Ask your health care provider how often you should have your eyes checked.  Personal lifestyle choices, including:  Daily care of your teeth and gums.  Regular physical activity.  Eating a healthy diet.  Avoiding tobacco and drug use.  Limiting alcohol use.  Practicing safe sex.  Taking low-dose aspirin every day starting at age 70. What happens during an annual well check? The services and screenings done by your health care provider during your annual well check will depend on your age, overall health, lifestyle risk factors, and family history of disease. Counseling  Your health care provider may ask you questions about your:  Alcohol use.  Tobacco use.  Drug use.  Emotional well-being.  Home and relationship well-being.  Sexual  activity.  Eating habits.  Work and work Statistician. Screening  You may have the following tests or measurements:  Height, weight, and BMI.  Blood pressure.  Lipid and cholesterol levels. These may be checked every 5 years, or more frequently if you are over 100 years old.  Skin check.  Lung cancer screening. You may have this screening every year starting at age 39 if you have a 30-pack-year history of smoking and currently smoke or have quit within the past 15 years.  Fecal occult blood test (FOBT) of the stool. You may have this test every year starting at age 15.  Flexible sigmoidoscopy or colonoscopy. You may have a sigmoidoscopy every 5 years or a colonoscopy every 10 years starting at age 28.  Prostate cancer screening. Recommendations will vary depending on your family history and other risks.  Hepatitis C blood test.  Hepatitis B blood test.  Sexually transmitted disease (STD) testing.  Diabetes screening. This is done by checking your blood sugar (glucose) after you have not eaten for a while (fasting). You may have this done every 1-3 years. Discuss your test results, treatment options, and if necessary, the need for more tests with your health care provider. Vaccines  Your health care provider may recommend certain vaccines, such as:  Influenza vaccine. This is recommended every year.  Tetanus, diphtheria, and acellular pertussis (Tdap, Td) vaccine. You may need a Td booster every 10 years.  Zoster vaccine. You may need this after age 55.  Pneumococcal 13-valent conjugate (PCV13) vaccine. You may need this if you have certain conditions and have not been vaccinated.  Pneumococcal polysaccharide (PPSV23) vaccine. You may need one or two doses if you smoke cigarettes or if you have  certain conditions. Talk to your health care provider about which screenings and vaccines you need and how often you need them. This information is not intended to replace advice  given to you by your health care provider. Make sure you discuss any questions you have with your health care provider. Document Released: 05/12/2015 Document Revised: 01/03/2016 Document Reviewed: 02/14/2015 Elsevier Interactive Patient Education  2017 [place] Prevention in the Home Falls can cause injuries. They can happen to people of all ages. There are many things you can do to make your home safe and to help prevent falls. What can I do on the outside of my home?  Regularly fix the edges of walkways and driveways and fix any cracks.  Remove anything that might make you trip as you walk through a door, such as a raised step or threshold.  Trim any bushes or trees on the path to your home.  Use bright outdoor lighting.  Clear any walking paths of anything that might make someone trip, such as rocks or tools.  Regularly check to see if handrails are loose or broken. Make sure that both sides of any steps have handrails.  Any raised decks and porches should have guardrails on the edges.  Have any leaves, snow, or ice cleared regularly.  Use sand or salt on walking paths during winter.  Clean up any spills in your garage right away. This includes oil or grease spills. What can I do in the bathroom?  Use night lights.  Install grab bars by the toilet and in the tub and shower. Do not use towel bars as grab bars.  Use non-skid mats or decals in the tub or shower.  If you need to sit down in the shower, use a plastic, non-slip stool.  Keep the floor dry. Clean up any water that spills on the floor as soon as it happens.  Remove soap buildup in the tub or shower regularly.  Attach bath mats securely with double-sided non-slip rug tape.  Do not have throw rugs and other things on the floor that can make you trip. What can I do in the bedroom?  Use night lights.  Make sure that you have a light by your bed that is easy to reach.  Do not use any sheets or  blankets that are too big for your bed. They should not hang down onto the floor.  Have a firm chair that has side arms. You can use this for support while you get dressed.  Do not have throw rugs and other things on the floor that can make you trip. What can I do in the kitchen?  Clean up any spills right away.  Avoid walking on wet floors.  Keep items that you use a lot in easy-to-reach places.  If you need to reach something above you, use a strong step stool that has a grab bar.  Keep electrical cords out of the way.  Do not use floor polish or wax that makes floors slippery. If you must use wax, use non-skid floor wax.  Do not have throw rugs and other things on the floor that can make you trip. What can I do with my stairs?  Do not leave any items on the stairs.  Make sure that there are handrails on both sides of the stairs and use them. Fix handrails that are broken or loose. Make sure that handrails are as long as the stairways.  Check any carpeting to  make sure that it is firmly attached to the stairs. Fix any carpet that is loose or worn.  Avoid having throw rugs at the top or bottom of the stairs. If you do have throw rugs, attach them to the floor with carpet tape.  Make sure that you have a light switch at the top of the stairs and the bottom of the stairs. If you do not have them, ask someone to add them for you. What else can I do to help prevent falls?  Wear shoes that:  Do not have high heels.  Have rubber bottoms.  Are comfortable and fit you well.  Are closed at the toe. Do not wear sandals.  If you use a stepladder:  Make sure that it is fully opened. Do not climb a closed stepladder.  Make sure that both sides of the stepladder are locked into place.  Ask someone to hold it for you, if possible.  Clearly mark and make sure that you can see:  Any grab bars or handrails.  First and last steps.  Where the edge of each step is.  Use tools  that help you move around (mobility aids) if they are needed. These include:  Canes.  Walkers.  Scooters.  Crutches.  Turn on the lights when you go into a dark area. Replace any light bulbs as soon as they burn out.  Set up your furniture so you have a clear path. Avoid moving your furniture around.  If any of your floors are uneven, fix them.  If there are any pets around you, be aware of where they are.  Review your medicines with your doctor. Some medicines can make you feel dizzy. This can increase your chance of falling. Ask your doctor what other things that you can do to help prevent falls. This information is not intended to replace advice given to you by your health care provider. Make sure you discuss any questions you have with your health care provider. Document Released: 02/09/2009 Document Revised: 09/21/2015 Document Reviewed: 05/20/2014 Elsevier Interactive Patient Education  2017 [employer].

## 2020-09-29 DIAGNOSIS — E109 Type 1 diabetes mellitus without complications: Secondary | ICD-10-CM | POA: Diagnosis not present

## 2020-10-02 ENCOUNTER — Encounter: Payer: Self-pay | Admitting: Internal Medicine

## 2020-10-02 DIAGNOSIS — E1059 Type 1 diabetes mellitus with other circulatory complications: Secondary | ICD-10-CM

## 2020-10-02 MED ORDER — DEXCOM G6 TRANSMITTER MISC: 2 refills | Status: DC

## 2020-10-10 ENCOUNTER — Other Ambulatory Visit: Payer: Self-pay | Admitting: Family Medicine

## 2020-10-10 ENCOUNTER — Other Ambulatory Visit: Payer: Self-pay | Admitting: Family

## 2020-10-10 DIAGNOSIS — I10 Essential (primary) hypertension: Secondary | ICD-10-CM

## 2020-10-10 DIAGNOSIS — E782 Mixed hyperlipidemia: Secondary | ICD-10-CM

## 2020-10-10 NOTE — Telephone Encounter (Signed)
  Last OV 07/03/20 Last fill 10/06/19  #90/1

## 2020-10-22 ENCOUNTER — Other Ambulatory Visit: Payer: Self-pay | Admitting: Family Medicine

## 2020-10-22 DIAGNOSIS — M8580 Other specified disorders of bone density and structure, unspecified site: Secondary | ICD-10-CM

## 2020-10-24 ENCOUNTER — Other Ambulatory Visit: Payer: Self-pay

## 2020-10-24 DIAGNOSIS — E782 Mixed hyperlipidemia: Secondary | ICD-10-CM

## 2020-10-24 MED ORDER — ATORVASTATIN CALCIUM 40 MG PO TABS: ORAL_TABLET | ORAL | 0 refills | Status: DC

## 2020-10-24 NOTE — Telephone Encounter (Signed)
Last OV 07/03/20 Last fill 07/12/20  #90/0

## 2020-10-26 ENCOUNTER — Other Ambulatory Visit: Payer: Self-pay | Admitting: Family Medicine

## 2020-10-26 DIAGNOSIS — N179 Acute kidney failure, unspecified: Secondary | ICD-10-CM

## 2020-10-26 DIAGNOSIS — I10 Essential (primary) hypertension: Secondary | ICD-10-CM

## 2020-10-31 DIAGNOSIS — E109 Type 1 diabetes mellitus without complications: Secondary | ICD-10-CM | POA: Diagnosis not present

## 2020-11-14 ENCOUNTER — Other Ambulatory Visit: Payer: Self-pay | Admitting: Family Medicine

## 2020-11-24 ENCOUNTER — Other Ambulatory Visit: Payer: Self-pay

## 2020-11-27 ENCOUNTER — Encounter: Payer: Self-pay | Admitting: Family Medicine

## 2020-11-27 ENCOUNTER — Ambulatory Visit (INDEPENDENT_AMBULATORY_CARE_PROVIDER_SITE_OTHER): Payer: PPO | Admitting: Family Medicine

## 2020-11-27 ENCOUNTER — Other Ambulatory Visit: Payer: Self-pay

## 2020-11-27 VITALS — BP 146/64 | HR 79 | Temp 98.0°F | Ht 67.0 in | Wt 195.4 lb

## 2020-11-27 DIAGNOSIS — Z23 Encounter for immunization: Secondary | ICD-10-CM | POA: Diagnosis not present

## 2020-11-27 DIAGNOSIS — Z0000 Encounter for general adult medical examination without abnormal findings: Secondary | ICD-10-CM

## 2020-11-27 DIAGNOSIS — R2681 Unsteadiness on feet: Secondary | ICD-10-CM

## 2020-11-27 DIAGNOSIS — I10 Essential (primary) hypertension: Secondary | ICD-10-CM

## 2020-11-27 DIAGNOSIS — N1832 Chronic kidney disease, stage 3b: Secondary | ICD-10-CM | POA: Diagnosis not present

## 2020-11-27 MED ORDER — AMLODIPINE BESYLATE 10 MG PO TABS: 10.0000 mg | ORAL_TABLET | Freq: Every day | ORAL | 2 refills | Status: DC

## 2020-11-27 NOTE — Progress Notes (Signed)
Established Patient Office Visit  Subjective:  Patient ID: Adrian Neal, male    DOB: Jan 31, 1966  Age: 55 y.o. MRN: [id number]  CC:  Chief Complaint  Patient presents with   Follow-up    Follow up on medications, concerns about dizzy spells that come and go, patient would like to know if he could have the pills that are to be cut in half if the prescription could be written for half a pill due to cutting them at home not working for patient.     HPI Adrian Neal presents for follow-up of hypertension, CKD and health maintenance.  Has not had the Shingrix vaccine.  Diabetic eye exam is scheduled for the 22nd of this month.  Blood pressures have been running up in the 140s.  He did see nephrology.  Patient is chronically unstable on his feet status post CVA.  [facility]  He uses a cane for ambulation.  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[place])    PDR OU   Sleep apnea    on BiPAP   Stroke ([place]) 2016    Past Surgical History:  Procedure Laterality Date   ANTERIOR APPROACH HEMI HIP ARTHROPLASTY Left 01/01/2019   Procedure: ANTERIOR APPROACH total hip ARTHROPLASTY;  Surgeon: Rod Can, MD;  Location: [facility];  Service: Orthopedics;  Laterality: Left;   CATARACT EXTRACTION Right 1994   CATARACT EXTRACTION W/ INTRAOCULAR LENS IMPLANT  1994   EYE SURGERY Right 1991   vitrectomy   EYE SURGERY Right 1992   scar tissue removed from retina   EYE SURGERY Right 1994   Cat Sx    Family History  Problem Relation Age of Onset   Hypertension Mother    Hyperlipidemia Mother    Hyperlipidemia Father    Hypertension  Father    Diabetes Father    Kidney disease Father        had a kidney transplant    Stroke Brother    Diabetes Brother    Pulmonary fibrosis Paternal Grandmother    Lung cancer Paternal Grandfather    Diabetes Daughter    Colon cancer Neg Hx    Esophageal cancer Neg Hx    Rectal cancer Neg Hx    Stomach cancer Neg Hx     Social History   Socioeconomic History   Marital status: Married    Spouse name: Not on file   Number of children: 3   Years of education: Not on file   Highest education level: Not on file  Occupational History   Occupation: Disabled  Tobacco Use   Smoking status: Former    Packs/day: 1.00    Years: 6.00    Pack years: 6.00    Types: Cigarettes    Quit date: 04/30/1991    Years since quitting: 29.6   Smokeless tobacco: Never  Vaping Use   Vaping Use: Never used  Substance and Sexual Activity   Alcohol use: No   Drug use: Never   Sexual activity: Not on file  Other Topics  Concern   Not on file  Social History Narrative   Right handed   Lives wife and kids in a two story home   Social Determinants of Health   Financial Resource Strain: Low Risk    Difficulty of Paying Living Expenses: Not hard at all  Food Insecurity: No Food Insecurity   Worried About Charity fundraiser in the Last Year: Never true   Arboriculturist in the Last Year: Never true  Transportation Needs: No Transportation Needs   Lack of Transportation (Medical): No   Lack of Transportation (Non-Medical): No  Physical Activity: Insufficiently Active   Days of Exercise per Week: 2 days   Minutes of Exercise per Session: 60 min  Stress: No Stress Concern Present   Feeling of Stress : Not at all  Social Connections: Socially Integrated   Frequency of Communication with Friends and Family: More than three times a week   Frequency of Social Gatherings with Friends and Family: More than three times a week   Attends Religious Services: More than 4 times per year   Active Member  of [employer] or Organizations: Yes   Attends Music therapist: More than 4 times per year   Marital Status: Married  Human resources officer Violence: Not At Risk   Fear of Current or Ex-Partner: No   Emotionally Abused: No   Physically Abused: No   Sexually Abused: No    Outpatient Medications Prior to Visit  Medication Sig Dispense Refill   alendronate (FOSAMAX) 70 MG tablet TAKE 1 TABLET BY MOUTH EVERY 7 DAYS. TAKE WITH A FULL GLASS OF WATER ON AN EMPTY STOMACH 4 tablet 0   atorvastatin (LIPITOR) 40 MG tablet TAKE 1 TABLET BY MOUTH ONCE DAILY AT  6PM 90 tablet 0   Calcium Carbonate-Vitamin D 600-400 MG-UNIT tablet Take 1 tablet by mouth 2 (two) times daily.      carvedilol (COREG) 25 MG tablet Take 25 mg by mouth 2 (two) times daily.     cloNIDine (CATAPRES) 0.2 MG tablet Take 1 tablet by mouth twice daily 180 tablet 0   clopidogrel (PLAVIX) 75 MG tablet TAKE 1 TABLET BY MOUTH ONCE DAILY -ASPIRIN  AND  PLAVIX  FOR  3  MONTHS,  AFTER  THAT-  STOP  ASPIRIN-  TAKE  PLAVIX  ALONE 90 tablet 3   Continuous Blood Gluc Receiver (DEXCOM G6 RECEIVER) DEVI 1 Device by Does not apply route as directed. 1 Device 0   Continuous Blood Gluc Sensor (DEXCOM G6 SENSOR) MISC 3 Devices by Does not apply route as directed. 3 each 6   Continuous Blood Gluc Transmit (DEXCOM G6 TRANSMITTER) MISC Use to monitor blood sugar. Change every 90 days. 1 each 2   furosemide (LASIX) 20 MG tablet Take 1 tablet by mouth once daily 90 tablet 1   hydrALAZINE (APRESOLINE) 10 MG tablet Take 1 tablet by mouth once daily 30 tablet 0   Insulin Infusion Pump (T:SLIM INSULIN PUMP) DEVI by Does not apply route.     insulin lispro (HUMALOG) 100 UNIT/ML injection Max daily dose of 100 units via pump DX E10.59 (vials) 90 mL 4   lisinopril (ZESTRIL) 20 MG tablet Take 1 tablet by mouth once daily 90 tablet 0   pantoprazole (PROTONIX) 40 MG tablet Take 1 tablet by mouth once daily 90 tablet 0   spironolactone (ALDACTONE) 25 MG tablet  Take 0.5 tablets (12.5 mg total) by mouth daily. 15 tablet 11   amLODipine (NORVASC)  10 MG tablet Take 1/2 (one-half) tablet by mouth once daily 45 tablet 0   fluticasone (FLONASE) 50 MCG/ACT nasal spray Place 2 sprays into both nostrils daily. 15.8 mL 11   tamsulosin (FLOMAX) 0.4 MG CAPS capsule 0.4 mg as directed (Patient not taking: No sig reported)     No facility-administered medications prior to visit.    No Known Allergies  ROS Review of Systems  Constitutional: Negative.   HENT: Negative.    Eyes:  Negative for photophobia and visual disturbance.  Respiratory: Negative.    Cardiovascular: Negative.   Gastrointestinal: Negative.   Neurological:  Positive for dizziness.  Psychiatric/Behavioral: Negative.       Objective:    Physical Exam Vitals and nursing note reviewed.  Constitutional:      General: He is not in acute distress.    Appearance: Normal appearance. He is not ill-appearing, toxic-appearing or diaphoretic.  HENT:     Head: Normocephalic and atraumatic.     Right Ear: Tympanic membrane, ear canal and external ear normal.     Left Ear: Tympanic membrane and ear canal normal.     Mouth/Throat:     Mouth: Mucous membranes are moist.     Pharynx: Oropharynx is clear. No oropharyngeal exudate or posterior oropharyngeal erythema.  Eyes:     General: No scleral icterus.       Right eye: No discharge.        Left eye: No discharge.     Extraocular Movements: Extraocular movements intact.     Conjunctiva/sclera: Conjunctivae normal.     Pupils: Pupils are equal, round, and reactive to light.  Neck:     Vascular: No carotid bruit.  Cardiovascular:     Rate and Rhythm: Normal rate and regular rhythm.  Pulmonary:     Effort: Pulmonary effort is normal.     Breath sounds: Normal breath sounds.  Musculoskeletal:     Cervical back: No rigidity or tenderness.     Right lower leg: No edema.     Left lower leg: No edema.  Lymphadenopathy:     Cervical: No  cervical adenopathy.  Neurological:     Mental Status: He is alert and oriented to person, place, and time.  Psychiatric:        Mood and Affect: Mood normal.  In no  BP (!) 146/64 (BP Location: Left Arm, Patient Position: Sitting, Cuff Size: Normal)   Pulse 79   Temp 98 F (36.7 C) (Temporal)   Ht 5\' 7"  (1.702 m)   Wt 195 lb 6.4 oz (88.6 kg)   SpO2 98%   BMI 30.60 kg/m  Wt Readings from Last 3 Encounters:  11/27/20 195 lb 6.4 oz (88.6 kg)  09/12/20 197 lb (89.4 kg)  07/03/20 197 lb 3.2 oz (89.4 kg)     Health Maintenance Due  Topic Date Due   Hepatitis C Screening  Never done   Zoster Vaccines- Shingrix (1 of 2) Never done   OPHTHALMOLOGY EXAM  07/13/2020   HEMOGLOBIN A1C  11/20/2020   INFLUENZA VACCINE  11/27/2020    There are no preventive care reminders to display for this patient.  Lab Results  Component Value Date   TSH 3.18 01/04/2020   Lab Results  Component Value Date   WBC 4.9 07/03/2020   HGB 12.0 (L) 07/03/2020   HCT 34.2 (L) 07/03/2020   MCV 87.1 07/03/2020   PLT 134.0 (L) 07/03/2020   Lab Results  Component Value Date   NA  141 07/03/2020   K 4.6 07/03/2020   CO2 25 07/03/2020   GLUCOSE 163 (H) 07/03/2020   BUN 49 (H) 07/03/2020   CREATININE 2.39 (H) 07/03/2020   BILITOT 0.7 01/04/2020   ALKPHOS 81 01/04/2020   AST 17 01/04/2020   ALT 18 01/04/2020   PROT 6.0 01/04/2020   ALBUMIN 3.8 01/04/2020   CALCIUM 8.6 07/03/2020   ANIONGAP 10 04/08/2020   GFR 29.97 (L) 07/03/2020   Lab Results  Component Value Date   CHOL 107 10/28/2018   Lab Results  Component Value Date   HDL 35.10 (L) 10/28/2018   Lab Results  Component Value Date   LDLCALC 60 10/28/2018   Lab Results  Component Value Date   TRIG 58.0 10/28/2018   Lab Results  Component Value Date   CHOLHDL 3 10/28/2018   Lab Results  Component Value Date   HGBA1C 6.9 (A) 05/23/2020      Assessment & Plan:   Problem List Items Addressed This Visit        Cardiovascular and Mediastinum   Essential hypertension - Primary   Relevant Medications   amLODipine (NORVASC) 10 MG tablet   Other Relevant Orders   Basic metabolic panel     Genitourinary   Stage 3b chronic kidney disease ([place])   Relevant Orders   Basic metabolic panel     Other   Healthcare maintenance   Relevant Orders   Varicella-zoster vaccine IM (Shingrix)   Other Visit Diagnoses     Benign essential HTN       Relevant Medications   amLODipine (NORVASC) 10 MG tablet       Meds ordered this encounter  Medications   amLODipine (NORVASC) 10 MG tablet    Sig: Take 1 tablet (10 mg total) by mouth daily.    Dispense:  90 tablet    Refill:  2    Follow-up: Return in about 3 months (around 02/27/2021), or Increase norvasc to 10mg  daily. Check and record bps..  Will schedule follow-up with renal.  May need to discontinue spironolactone with his decreasing GFR.  It may not be effective.  We will continue to use cane for ambulation.  We will start Shingrix vaccine series today second dose in 3 months.  [name], MD

## 2020-11-28 LAB — BASIC METABOLIC PANEL
BUN: 61 mg/dL — ABNORMAL HIGH (ref 6–23)
CO2: 22 mEq/L (ref 19–32)
Calcium: 9.1 mg/dL (ref 8.4–10.5)
Chloride: 105 mEq/L (ref 96–112)
Creatinine, Ser: 2.83 mg/dL — ABNORMAL HIGH (ref 0.40–1.50)
GFR: 24.4 mL/min — ABNORMAL LOW (ref >60.00)
Glucose, Bld: 192 mg/dL — ABNORMAL HIGH (ref 70–99)
Potassium: 4.4 mEq/L (ref 3.5–5.1)
Sodium: 139 mEq/L (ref 135–145)

## 2020-12-01 DIAGNOSIS — E109 Type 1 diabetes mellitus without complications: Secondary | ICD-10-CM | POA: Diagnosis not present

## 2020-12-05 ENCOUNTER — Telehealth: Payer: Self-pay

## 2020-12-05 NOTE — Progress Notes (Signed)
Chronic Care Management Pharmacy Assistant   Name: Adrian Neal  MRN: [id number] DOB: 01/27/1966  Reason for Encounter:Diabetes Disease State Call.   Recent office visits:  11/27/2020 Dr.Kremer MD (PCP) Increase norvasc to 10mg  daily,  May need to discontinue spironolactone with his decreasing GFR 09/12/2020 Caroleen Hamman LPN (PCP Office) Annual wellness Visit   Recent consult visits:  No recent [facility] visits:  None in previous 6 months  Medications: Outpatient Encounter Medications as of 12/05/2020  Medication Sig   alendronate (FOSAMAX) 70 MG tablet TAKE 1 TABLET BY MOUTH EVERY 7 DAYS. TAKE WITH A FULL GLASS OF WATER ON AN EMPTY STOMACH   amLODipine (NORVASC) 10 MG tablet Take 1 tablet (10 mg total) by mouth daily.   atorvastatin (LIPITOR) 40 MG tablet TAKE 1 TABLET BY MOUTH ONCE DAILY AT  6PM   Calcium Carbonate-Vitamin D 600-400 MG-UNIT tablet Take 1 tablet by mouth 2 (two) times daily.    carvedilol (COREG) 25 MG tablet Take 25 mg by mouth 2 (two) times daily.   cloNIDine (CATAPRES) 0.2 MG tablet Take 1 tablet by mouth twice daily   clopidogrel (PLAVIX) 75 MG tablet TAKE 1 TABLET BY MOUTH ONCE DAILY -ASPIRIN  AND  PLAVIX  FOR  3  MONTHS,  AFTER  THAT-  STOP  ASPIRIN-  TAKE  PLAVIX  ALONE   Continuous Blood Gluc Receiver (DEXCOM G6 RECEIVER) DEVI 1 Device by Does not apply route as directed.   Continuous Blood Gluc Sensor (DEXCOM G6 SENSOR) MISC 3 Devices by Does not apply route as directed.   Continuous Blood Gluc Transmit (DEXCOM G6 TRANSMITTER) MISC Use to monitor blood sugar. Change every 90 days.   furosemide (LASIX) 20 MG tablet Take 1 tablet by mouth once daily   hydrALAZINE (APRESOLINE) 10 MG tablet Take 1 tablet by mouth once daily   Insulin Infusion Pump (T:SLIM INSULIN PUMP) DEVI by Does not apply route.   insulin lispro (HUMALOG) 100 UNIT/ML injection Max daily dose of 100 units via pump DX E10.59 (vials)   lisinopril (ZESTRIL) 20 MG tablet  Take 1 tablet by mouth once daily   pantoprazole (PROTONIX) 40 MG tablet Take 1 tablet by mouth once daily   spironolactone (ALDACTONE) 25 MG tablet Take 0.5 tablets (12.5 mg total) by mouth daily.   No facility-administered encounter medications on file as of 12/05/2020.    Care Gaps: Hepatitis C Screening Ophthalmology Exam Hemoglobin A1C Influenza Vaccine Star Rating Drugs: Lisinopril 20 mg 90 day supply last filled 11/04/2020 at [facility]. Atorvastatin 40 mg 90 day supply last filled 10/24/2020 at [facility]. Medication fill gaps: Clonidine 0.2 MG 90 day supply last filled 07/12/2020.  Recent Relevant Labs: Lab Results  Component Value Date/Time   HGBA1C 6.9 (A) 05/23/2020 09:47 AM   HGBA1C 6.0 (A) 11/16/2019 01:38 PM   HGBA1C 7.2 (H) 01/01/2019 04:34 AM   HGBA1C 7.7 (H) 01/20/2015 05:17 AM   MICROALBUR 28.4 (H) 10/28/2018 08:39 AM   MICROALBUR 71.1 (H) 06/15/2018 08:51 AM    Kidney Function Lab Results  Component Value Date/Time   CREATININE 2.83 (H) 11/27/2020 04:23 PM   CREATININE 2.39 (H) 07/03/2020 09:22 AM   CREATININE 2.55 (H) 06/09/2020 03:58 PM   CREATININE 2.07 (H) 01/08/2019 04:38 PM   GFR 24.40 (L) 11/27/2020 04:23 PM   GFRNONAA 36 (L) 04/08/2020 02:37 AM   GFRAA 35 (L) 01/03/2019 07:28 AM    Current antihyperglycemic regimen:  Humalog 100 unit/ml injection  What recent interventions/DTPs have been made to improve glycemic control:  None ID Have there been any recent hospitalizations or ED visits since last visit with CPP? No   Adherence Review: Is the patient currently on a STATIN medication? Yes Is the patient currently on ACE/ARB medication? Yes Does the patient have >5 day gap between last estimated fill dates? No  I have attempted without success to contact this patient by phone three times to do his Diabetes Disease State call. I left a Voice message for patient to return my call.  LVM 08/09,08/10,08/16  [facility] Pharmacist Assistant [phone]

## 2020-12-06 ENCOUNTER — Other Ambulatory Visit: Payer: Self-pay | Admitting: Family Medicine

## 2020-12-06 DIAGNOSIS — M8580 Other specified disorders of bone density and structure, unspecified site: Secondary | ICD-10-CM

## 2020-12-06 DIAGNOSIS — I10 Essential (primary) hypertension: Secondary | ICD-10-CM

## 2020-12-06 NOTE — Telephone Encounter (Signed)
Refill requests for : Hydrolazine 10 mg LR 10/26/20, #30, 0 rf  Alendronate 70 mg LR  10/22/20, #4, 0 rf  Amlodipine 10 mg  LR  11/27/20, #90, 0 rf  LOV 11/27/20 FOV  02/27/21  Please review and advise.  Thanks. Dm/cma

## 2020-12-26 DIAGNOSIS — E1022 Type 1 diabetes mellitus with diabetic chronic kidney disease: Secondary | ICD-10-CM | POA: Diagnosis not present

## 2020-12-26 DIAGNOSIS — I129 Hypertensive chronic kidney disease with stage 1 through stage 4 chronic kidney disease, or unspecified chronic kidney disease: Secondary | ICD-10-CM | POA: Diagnosis not present

## 2020-12-26 DIAGNOSIS — D631 Anemia in chronic kidney disease: Secondary | ICD-10-CM | POA: Diagnosis not present

## 2020-12-26 DIAGNOSIS — N2581 Secondary hyperparathyroidism of renal origin: Secondary | ICD-10-CM | POA: Diagnosis not present

## 2020-12-26 DIAGNOSIS — N184 Chronic kidney disease, stage 4 (severe): Secondary | ICD-10-CM | POA: Diagnosis not present

## 2021-01-04 ENCOUNTER — Encounter: Payer: Self-pay | Admitting: Internal Medicine

## 2021-01-04 ENCOUNTER — Ambulatory Visit (INDEPENDENT_AMBULATORY_CARE_PROVIDER_SITE_OTHER): Payer: PPO | Admitting: Internal Medicine

## 2021-01-04 ENCOUNTER — Other Ambulatory Visit: Payer: Self-pay

## 2021-01-04 VITALS — Ht 67.0 in

## 2021-01-04 DIAGNOSIS — E1059 Type 1 diabetes mellitus with other circulatory complications: Secondary | ICD-10-CM

## 2021-01-04 DIAGNOSIS — E1022 Type 1 diabetes mellitus with diabetic chronic kidney disease: Secondary | ICD-10-CM

## 2021-01-04 DIAGNOSIS — E10319 Type 1 diabetes mellitus with unspecified diabetic retinopathy without macular edema: Secondary | ICD-10-CM | POA: Diagnosis not present

## 2021-01-04 DIAGNOSIS — N184 Chronic kidney disease, stage 4 (severe): Secondary | ICD-10-CM

## 2021-01-04 LAB — POCT GLYCOSYLATED HEMOGLOBIN (HGB A1C): Hemoglobin A1C: 6.8 % — AB (ref 4.0–5.6)

## 2021-01-04 MED ORDER — ONETOUCH VERIO VI STRP: 1.0000 | ORAL_STRIP | Freq: Three times a day (TID) | 3 refills | Status: AC

## 2021-01-04 NOTE — Patient Instructions (Signed)
Pump   T-Slim      Insulin type   HUMALOG    Basal rate       0000-1100   1.0 u/h    1100-0000 0.9 u          I:C ratio   0000-0600 1:9   0600-1100  1:6   1100-0000 1:8      Sensitivity      0000 30      AIT      0000  5      Goal       0000  110
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
## 2021-01-06 ENCOUNTER — Other Ambulatory Visit: Payer: Self-pay | Admitting: Family Medicine

## 2021-01-06 DIAGNOSIS — M8580 Other specified disorders of bone density and structure, unspecified site: Secondary | ICD-10-CM

## 2021-01-09 ENCOUNTER — Telehealth: Payer: Self-pay

## 2021-01-09 NOTE — Progress Notes (Signed)
Chronic Care Management Pharmacy Assistant   Name: Adrian Neal  MRN: [id number] DOB: May 09, 1965  Reason for Encounter:Diabetes Disease State Call.   Recent office visits:  No recent Office Visit  Recent consult visits:  01/04/2021 Dr.Shamleffer MD (Endocrinology) No Medication Changes noted ,Follow up in 6 months   Hospital visits: None in previous 6 months  Medications: Outpatient Encounter Medications as of 01/09/2021  Medication Sig   alendronate (FOSAMAX) 70 MG tablet TAKE 1 TABLET BY MOUTH EVERY 7 DAYS -  TAKE  WITH  A  FULL  GLASS  OF  WATER  ON  AN  EMPTY  STOMACH   amLODipine (NORVASC) 10 MG tablet Take 1/2 (one-half) tablet by mouth once daily (Patient not taking: Reported on 01/04/2021)   atorvastatin (LIPITOR) 40 MG tablet TAKE 1 TABLET BY MOUTH ONCE DAILY AT  6PM   Calcium Carbonate-Vitamin D 600-400 MG-UNIT tablet Take 1 tablet by mouth 2 (two) times daily.    carvedilol (COREG) 25 MG tablet Take 25 mg by mouth 2 (two) times daily.   cloNIDine (CATAPRES) 0.2 MG tablet Take 1 tablet by mouth twice daily   clopidogrel (PLAVIX) 75 MG tablet TAKE 1 TABLET BY MOUTH ONCE DAILY -ASPIRIN  AND  PLAVIX  FOR  3  MONTHS,  AFTER  THAT-  STOP  ASPIRIN-  TAKE  PLAVIX  ALONE   Continuous Blood Gluc Receiver (DEXCOM G6 RECEIVER) DEVI 1 Device by Does not apply route as directed.   Continuous Blood Gluc Sensor (DEXCOM G6 SENSOR) MISC 3 Devices by Does not apply route as directed.   Continuous Blood Gluc Transmit (DEXCOM G6 TRANSMITTER) MISC Use to monitor blood sugar. Change every 90 days.   furosemide (LASIX) 20 MG tablet Take 1 tablet by mouth once daily (Patient not taking: Reported on 01/04/2021)   glucose blood (ONETOUCH VERIO) test strip 1 each by Other route 3 (three) times daily. Use as instructed   hydrALAZINE (APRESOLINE) 10 MG tablet Take 1 tablet by mouth once daily   Insulin Infusion Pump (T:SLIM INSULIN PUMP) DEVI by Does not apply route.   insulin lispro (HUMALOG) 100  UNIT/ML injection Max daily dose of 100 units via pump DX E10.59 (vials)   lisinopril (ZESTRIL) 20 MG tablet Take 1 tablet by mouth once daily (Patient taking differently: 40 mg.)   pantoprazole (PROTONIX) 40 MG tablet Take 1 tablet by mouth once daily   spironolactone (ALDACTONE) 25 MG tablet Take 0.5 tablets (12.5 mg total) by mouth daily.   No facility-administered encounter medications on file as of 01/09/2021.   Care Gaps: Hepatitis C Screening Ophthalmology Exam Hemoglobin A1C Influenza Vaccine Star Rating Drugs: Lisinopril 20 mg 30 day supply last filled 01/03/2021 at [facility]. Atorvastatin 40 mg 90 day supply last filled 10/24/2020 at [facility]. Medication fill gaps: Clonidine 0.2 MG 90 day supply last filled 07/12/2020.  Recent Relevant Labs: Lab Results  Component Value Date/Time   HGBA1C 6.8 (A) 01/04/2021 11:06 AM   HGBA1C 6.9 (A) 05/23/2020 09:47 AM   HGBA1C 7.2 (H) 01/01/2019 04:34 AM   HGBA1C 7.7 (H) 01/20/2015 05:17 AM   MICROALBUR 28.4 (H) 10/28/2018 08:39 AM   MICROALBUR 71.1 (H) 06/15/2018 08:51 AM    Kidney Function Lab Results  Component Value Date/Time   CREATININE 2.83 (H) 11/27/2020 04:23 PM   CREATININE 2.39 (H) 07/03/2020 09:22 AM   CREATININE 2.55 (H) 06/09/2020 03:58 PM   CREATININE 2.07 (H) 01/08/2019 04:38 PM   GFR 24.40 (  L) 11/27/2020 04:23 PM   GFRNONAA 36 (L) 04/08/2020 02:37 AM   GFRAA 35 (L) 01/03/2019 07:28 AM    Current antihyperglycemic regimen:  Humalog 100 unit/ml injection What recent interventions/DTPs have been made to improve glycemic control:  None Have there been any recent hospitalizations or ED visits since last visit with CPP? No  I have attempted without success to contact this patient by phone three times to do his Diabetes Disease State call. I left a Voice message for patient to return my call.  Adherence Review: Is the patient currently on a STATIN medication? Yes Is the patient currently on  ACE/ARB medication? Yes Does the patient have >5 day gap between last estimated fill dates? No   Anderson Malta Clinical Production designer, theatre/television/film [phone]

## 2021-01-10 DIAGNOSIS — E109 Type 1 diabetes mellitus without complications: Secondary | ICD-10-CM | POA: Diagnosis not present

## 2021-01-15 DIAGNOSIS — N184 Chronic kidney disease, stage 4 (severe): Secondary | ICD-10-CM | POA: Diagnosis not present

## 2021-01-17 ENCOUNTER — Encounter: Payer: Self-pay | Admitting: Internal Medicine

## 2021-01-19 ENCOUNTER — Other Ambulatory Visit: Payer: Self-pay | Admitting: Family Medicine

## 2021-01-19 DIAGNOSIS — Z8673 Personal history of transient ischemic attack (TIA), and cerebral infarction without residual deficits: Secondary | ICD-10-CM

## 2021-01-19 DIAGNOSIS — E782 Mixed hyperlipidemia: Secondary | ICD-10-CM

## 2021-01-19 DIAGNOSIS — I10 Essential (primary) hypertension: Secondary | ICD-10-CM

## 2021-02-01 DIAGNOSIS — G4733 Obstructive sleep apnea (adult) (pediatric): Secondary | ICD-10-CM | POA: Diagnosis not present

## 2021-02-01 DIAGNOSIS — R270 Ataxia, unspecified: Secondary | ICD-10-CM | POA: Diagnosis present

## 2021-02-01 DIAGNOSIS — E785 Hyperlipidemia, unspecified: Secondary | ICD-10-CM | POA: Diagnosis not present

## 2021-02-01 DIAGNOSIS — I10 Essential (primary) hypertension: Secondary | ICD-10-CM | POA: Diagnosis not present

## 2021-02-08 ENCOUNTER — Telehealth: Payer: Self-pay

## 2021-02-08 NOTE — Progress Notes (Signed)
Chronic Care Management Pharmacy Assistant   Name: Adrian Neal  MRN: [id number] DOB: 1966-01-26  Reason for Encounter:Diabetes Disease State Call.   Recent office visits:  No recent Office Visit  Recent consult visits:  No recent consult Visit  Hospital visits:  None in previous 6 months  Medications: Outpatient Encounter Medications as of 02/08/2021  Medication Sig   alendronate (FOSAMAX) 70 MG tablet TAKE 1 TABLET BY MOUTH EVERY 7 DAYS -  TAKE  WITH  A  FULL  GLASS  OF  WATER  ON  AN  EMPTY  STOMACH   amLODipine (NORVASC) 10 MG tablet Take 1/2 (one-half) tablet by mouth once daily (Patient not taking: Reported on 01/04/2021)   atorvastatin (LIPITOR) 40 MG tablet TAKE 1 TABLET BY MOUTH ONCE DAILY AT  6  PM   Calcium Carbonate-Vitamin D 600-400 MG-UNIT tablet Take 1 tablet by mouth 2 (two) times daily.    carvedilol (COREG) 25 MG tablet Take 25 mg by mouth 2 (two) times daily.   cloNIDine (CATAPRES) 0.2 MG tablet Take 1 tablet by mouth twice daily   clopidogrel (PLAVIX) 75 MG tablet TAKE 1 TABLET BY MOUTH ONCE DAILY **ASPIRIN  AND  PLAVIX  FOR  3  MONTHS,  AFTER  THAT  -STOP  ASPIRIN-  TAKE  PLAVIX  ALONE   Continuous Blood Gluc Receiver (DEXCOM G6 RECEIVER) DEVI 1 Device by Does not apply route as directed.   Continuous Blood Gluc Sensor (DEXCOM G6 SENSOR) MISC 3 Devices by Does not apply route as directed.   Continuous Blood Gluc Transmit (DEXCOM G6 TRANSMITTER) MISC Use to monitor blood sugar. Change every 90 days.   furosemide (LASIX) 20 MG tablet Take 1 tablet by mouth once daily (Patient not taking: Reported on 01/04/2021)   glucose blood (ONETOUCH VERIO) test strip 1 each by Other route 3 (three) times daily. Use as instructed   hydrALAZINE (APRESOLINE) 10 MG tablet Take 1 tablet by mouth once daily   Insulin Infusion Pump (T:SLIM INSULIN PUMP) DEVI by Does not apply route.   insulin lispro (HUMALOG) 100 UNIT/ML injection Max daily dose of 100 units via pump DX E10.59  (vials)   lisinopril (ZESTRIL) 20 MG tablet Take 1 tablet by mouth once daily (Patient taking differently: 40 mg.)   pantoprazole (PROTONIX) 40 MG tablet Take 1 tablet by mouth once daily   spironolactone (ALDACTONE) 25 MG tablet Take 0.5 tablets (12.5 mg total) by mouth daily.   No facility-administered encounter medications on file as of 02/08/2021.    Care Gaps: Hepatitis C Screening Ophthalmology Exam (Last Completed 07/14/2019) Shingrix Vaccine Influenza Vaccine HTN: 146/64 on 11/27/2020 Star Rating Drugs: Lisinopril 20 mg 30 day supply last filled 01/29/2021 at [facility]. Atorvastatin 40 mg 90 day supply last filled 09/23//2022 at [facility]. Medication fill gaps: Clonidine 0.2 MG 90 day supply last filled 07/12/2020.  Recent Relevant Labs: Lab Results  Component Value Date/Time   HGBA1C 6.8 (A) 01/04/2021 11:06 AM   HGBA1C 6.9 (A) 05/23/2020 09:47 AM   HGBA1C 7.2 (H) 01/01/2019 04:34 AM   HGBA1C 7.7 (H) 01/20/2015 05:17 AM   MICROALBUR 28.4 (H) 10/28/2018 08:39 AM   MICROALBUR 71.1 (H) 06/15/2018 08:51 AM    Kidney Function Lab Results  Component Value Date/Time   CREATININE 2.83 (H) 11/27/2020 04:23 PM   CREATININE 2.39 (H) 07/03/2020 09:22 AM   CREATININE 2.55 (H) 06/09/2020 03:58 PM   CREATININE 2.07 (H) 01/08/2019 04:38 PM   GFR  24.40 (L) 11/27/2020 04:23 PM   GFRNONAA 36 (L) 04/08/2020 02:37 AM   GFRAA 35 (L) 01/03/2019 07:28 AM    Current antihyperglycemic regimen:  Humalog 100 unit/ml injection What recent interventions/DTPs have been made to improve glycemic control:  None Have there been any recent hospitalizations or ED visits since last visit with CPP? No Patient denies hypoglycemic symptoms, including Pale, Sweaty, Shaky, Hungry, Nervous/irritable, and Vision changes Patient denies hyperglycemic symptoms, including blurry vision, excessive thirst, fatigue, polyuria, and weakness How often are you checking your blood sugar? Patient  states he has a Dexacom and his providers will up load his readings. What are your blood sugars ranging?  Patient has a Dexacom. During the week, how often does your blood glucose drop below 70? Never Are you checking your feet daily/regularly?   Patient states he does checks his feet daily, and denies any numbness.Patient states he has some swelling but not as bad as it was when he was taking Amlodipine.Patient states his Provider stop amlodipine and his feet did improve but still has some swelling in both feet.  Adherence Review: Is the patient currently on a STATIN medication? Yes Is the patient currently on ACE/ARB medication? Yes Does the patient have >5 day gap between last estimated fill dates? No  Anderson Malta Clinical Production designer, theatre/television/film [phone]

## 2021-02-09 DIAGNOSIS — E109 Type 1 diabetes mellitus without complications: Secondary | ICD-10-CM | POA: Diagnosis not present

## 2021-02-12 DIAGNOSIS — E103593 Type 1 diabetes mellitus with proliferative diabetic retinopathy without macular edema, bilateral: Secondary | ICD-10-CM | POA: Diagnosis not present

## 2021-02-12 DIAGNOSIS — H35033 Hypertensive retinopathy, bilateral: Secondary | ICD-10-CM | POA: Diagnosis not present

## 2021-02-12 DIAGNOSIS — H5203 Hypermetropia, bilateral: Secondary | ICD-10-CM | POA: Diagnosis not present

## 2021-02-12 DIAGNOSIS — H2512 Age-related nuclear cataract, left eye: Secondary | ICD-10-CM | POA: Diagnosis not present

## 2021-02-13 LAB — HM DIABETES EYE EXAM

## 2021-02-15 ENCOUNTER — Other Ambulatory Visit: Payer: Self-pay | Admitting: Family Medicine

## 2021-02-15 DIAGNOSIS — I10 Essential (primary) hypertension: Secondary | ICD-10-CM

## 2021-02-26 ENCOUNTER — Other Ambulatory Visit: Payer: Self-pay | Admitting: Family Medicine

## 2021-02-27 ENCOUNTER — Encounter: Payer: Self-pay | Admitting: Family Medicine

## 2021-02-27 ENCOUNTER — Ambulatory Visit (INDEPENDENT_AMBULATORY_CARE_PROVIDER_SITE_OTHER): Payer: PPO | Admitting: Family Medicine

## 2021-02-27 ENCOUNTER — Other Ambulatory Visit: Payer: Self-pay

## 2021-02-27 VITALS — BP 142/68 | HR 77 | Temp 98.6°F | Ht 67.0 in | Wt 193.4 lb

## 2021-02-27 DIAGNOSIS — S39011A Strain of muscle, fascia and tendon of abdomen, initial encounter: Secondary | ICD-10-CM

## 2021-02-27 DIAGNOSIS — R1011 Right upper quadrant pain: Secondary | ICD-10-CM

## 2021-02-27 DIAGNOSIS — Z23 Encounter for immunization: Secondary | ICD-10-CM

## 2021-02-27 NOTE — Progress Notes (Signed)
Established Patient Office Visit  Subjective:  Patient ID: Adrian Neal, male    DOB: 1966/02/26  Age: 55 y.o. MRN: [id number]  CC:  Chief Complaint  Patient presents with   Follow-up    3 month follow up, c/o pain muscle pain on right side of rib come and go.     HPI Adrian Neal presents for evaluation of a 22-month history of right upper quadrant pain.  Describes it as deep and up underneath the rib cage.  There is discomfort with movement through the abdomen and chest wall.  This is not consistent.  Exacerbated by recent mission trip for him to sleep on a hard cough.  Denies any change in his stooling patterns.  Urine output has decreased which is consistent with his history of evolving renal insufficiency.  He does not drink alcohol or use illicit drugs or smoke.  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[place])    PDR OU   Sleep apnea    on BiPAP   Stroke ([place]) 2016    Past Surgical History:  Procedure Laterality Date   ANTERIOR APPROACH HEMI HIP ARTHROPLASTY Left 01/01/2019   Procedure: ANTERIOR APPROACH total hip ARTHROPLASTY;  Surgeon: Rod Can, MD;  Location: [facility];  Service: Orthopedics;  Laterality: Left;   CATARACT EXTRACTION Right 1994   CATARACT EXTRACTION W/ INTRAOCULAR LENS IMPLANT  1994   EYE SURGERY Right 1991   vitrectomy   EYE SURGERY Right 1992   scar tissue removed from retina   EYE SURGERY Right 1994   Cat Sx    Family History  Problem Relation Age of Onset   Hypertension Mother    Hyperlipidemia Mother    Hyperlipidemia Father    Hypertension Father    Diabetes Father    Kidney disease Father        had a  kidney transplant    Stroke Brother    Diabetes Brother    Pulmonary fibrosis Paternal Grandmother    Lung cancer Paternal Grandfather    Diabetes Daughter    Colon cancer Neg Hx    Esophageal cancer Neg Hx    Rectal cancer Neg Hx    Stomach cancer Neg Hx     Social History   Socioeconomic History   Marital status: Married    Spouse name: Not on file   Number of children: 3   Years of education: Not on file   Highest education level: Not on file  Occupational History   Occupation: Disabled  Tobacco Use   Smoking status: Former    Packs/day: 1.00    Years: 6.00    Pack years: 6.00    Types: Cigarettes    Quit date: 04/30/1991    Years since quitting: 29.8   Smokeless tobacco: Never  Vaping Use   Vaping Use: Never used  Substance and Sexual Activity   Alcohol use: No   Drug use: Never   Sexual activity: Not on file  Other Topics Concern   Not on file  Social History Narrative   Right handed  Lives wife and kids in a two story home   Social Determinants of Health   Financial Resource Strain: Not on file  Food Insecurity: No Food Insecurity   Worried About Charity fundraiser in the Last Year: Never true   Arboriculturist in the Last Year: Never true  Transportation Needs: Not on file  Physical Activity: Insufficiently Active   Days of Exercise per Week: 2 days   Minutes of Exercise per Session: 60 min  Stress: No Stress Concern Present   Feeling of Stress : Not at all  Social Connections: Socially Integrated   Frequency of Communication with Friends and Family: More than three times a week   Frequency of Social Gatherings with Friends and Family: More than three times a week   Attends Religious Services: More than 4 times per year   Active Member of [employer] or Organizations: Yes   Attends Music therapist: More than 4 times per year   Marital Status: Married  Human resources officer Violence: Not At Risk   Fear of Current or Ex-Partner: No   Emotionally  Abused: No   Physically Abused: No   Sexually Abused: No    Outpatient Medications Prior to Visit  Medication Sig Dispense Refill   alendronate (FOSAMAX) 70 MG tablet TAKE 1 TABLET BY MOUTH EVERY 7 DAYS -  TAKE  WITH  A  FULL  GLASS  OF  WATER  ON  AN  EMPTY  STOMACH 12 tablet 1   atorvastatin (LIPITOR) 40 MG tablet TAKE 1 TABLET BY MOUTH ONCE DAILY AT  6  PM 90 tablet 0   Calcium Carbonate-Vitamin D 600-400 MG-UNIT tablet Take 1 tablet by mouth 2 (two) times daily.      carvedilol (COREG) 25 MG tablet Take 25 mg by mouth 2 (two) times daily.     chlorthalidone (HYGROTON) 25 MG tablet Take 12.5 mg by mouth daily.     cloNIDine (CATAPRES) 0.2 MG tablet Take 1 tablet by mouth twice daily 180 tablet 0   clopidogrel (PLAVIX) 75 MG tablet TAKE 1 TABLET BY MOUTH ONCE DAILY **ASPIRIN  AND  PLAVIX  FOR  3  MONTHS,  AFTER  THAT  -STOP  ASPIRIN-  TAKE  PLAVIX  ALONE 90 tablet 0   Continuous Blood Gluc Receiver (DEXCOM G6 RECEIVER) DEVI 1 Device by Does not apply route as directed. 1 Device 0   Continuous Blood Gluc Sensor (DEXCOM G6 SENSOR) MISC 3 Devices by Does not apply route as directed. 3 each 6   Continuous Blood Gluc Transmit (DEXCOM G6 TRANSMITTER) MISC Use to monitor blood sugar. Change every 90 days. 1 each 2   glucose blood (ONETOUCH VERIO) test strip 1 each by Other route 3 (three) times daily. Use as instructed 300 each 3   hydrALAZINE (APRESOLINE) 10 MG tablet Take 1 tablet by mouth once daily 30 tablet 0   Insulin Infusion Pump (T:SLIM INSULIN PUMP) DEVI by Does not apply route.     insulin lispro (HUMALOG) 100 UNIT/ML injection Max daily dose of 100 units via pump DX E10.59 (vials) 90 mL 4   lisinopril (ZESTRIL) 20 MG tablet Take 1 tablet by mouth once daily (Patient taking differently: 40 mg.) 90 tablet 0   pantoprazole (PROTONIX) 40 MG tablet Take 1 tablet by mouth once daily 90 tablet 0   spironolactone (ALDACTONE) 25 MG tablet Take 0.5 tablets (12.5 mg total) by mouth daily. 15  tablet 11   fluticasone (FLONASE) 50  MCG/ACT nasal spray Place 2 sprays into both nostrils daily.     amLODipine (NORVASC) 10 MG tablet Take 1/2 (one-half) tablet by mouth once daily (Patient not taking: Reported on 01/04/2021) 45 tablet 0   furosemide (LASIX) 20 MG tablet Take 1 tablet by mouth once daily (Patient not taking: Reported on 01/04/2021) 90 tablet 1   No facility-administered medications prior to visit.    No Known Allergies  ROS Review of Systems  Constitutional:  Negative for chills, diaphoresis, fatigue, fever and unexpected weight change.  HENT: Negative.    Eyes:  Negative for photophobia and visual disturbance.  Respiratory:  Negative for chest tightness, shortness of breath and wheezing.   Cardiovascular:  Negative for chest pain and palpitations.  Gastrointestinal:  Positive for abdominal pain. Negative for anal bleeding, blood in stool, constipation, diarrhea, nausea and vomiting.  Endocrine: Negative for polyphagia and polyuria.  Genitourinary:  Positive for decreased urine volume and dysuria.  Musculoskeletal:  Positive for gait problem and myalgias.  Skin:  Negative for pallor and rash.  Neurological:  Negative for speech difficulty and weakness.  Psychiatric/Behavioral: Negative.       Objective:    Physical Exam Vitals and nursing note reviewed.  Constitutional:      General: He is not in acute distress.    Appearance: Normal appearance. He is not ill-appearing, toxic-appearing or diaphoretic.  HENT:     Head: Normocephalic and atraumatic.     Right Ear: Tympanic membrane, ear canal and external ear normal.     Left Ear: Tympanic membrane, ear canal and external ear normal.     Mouth/Throat:     Mouth: Mucous membranes are moist.     Pharynx: Oropharynx is clear. No oropharyngeal exudate or posterior oropharyngeal erythema.  Eyes:     General: No scleral icterus.       Right eye: No discharge.        Left eye: No discharge.     Extraocular Movements:  Extraocular movements intact.     Conjunctiva/sclera: Conjunctivae normal.     Pupils: Pupils are equal, round, and reactive to light.  Cardiovascular:     Rate and Rhythm: Normal rate and regular rhythm.  Pulmonary:     Breath sounds: Normal breath sounds.  Abdominal:     General: Bowel sounds are normal. There is no distension.     Palpations: There is no mass.     Tenderness: There is no abdominal tenderness. There is no right CVA tenderness, left CVA tenderness, guarding or rebound.     Hernia: No hernia is present.  Musculoskeletal:     Cervical back: Neck supple. No rigidity or tenderness.  Lymphadenopathy:     Cervical: No cervical adenopathy.  Skin:    General: Skin is warm and dry.  Neurological:     Mental Status: He is alert.  Psychiatric:        Mood and Affect: Mood normal.        Behavior: Behavior normal.    BP (!) 142/68 (BP Location: Left Arm, Patient Position: Sitting, Cuff Size: Large)   Pulse 77   Temp 98.6 F (37 C) (Temporal)   Ht 5\' 7"  (1.702 m)   Wt 193 lb 6.4 oz (87.7 kg)   SpO2 97%   BMI 30.29 kg/m  Wt Readings from Last 3 Encounters:  02/27/21 193 lb 6.4 oz (87.7 kg)  11/27/20 195 lb 6.4 oz (88.6 kg)  09/12/20 197 lb (89.4 kg)  Health Maintenance Due  Topic Date Due   Hepatitis C Screening  Never done   INFLUENZA VACCINE  11/27/2020   Zoster Vaccines- Shingrix (2 of 2) 01/22/2021    There are no preventive care reminders to display for this patient.  Lab Results  Component Value Date   TSH 3.18 01/04/2020   Lab Results  Component Value Date   WBC 4.9 07/03/2020   HGB 12.0 (L) 07/03/2020   HCT 34.2 (L) 07/03/2020   MCV 87.1 07/03/2020   PLT 134.0 (L) 07/03/2020   Lab Results  Component Value Date   NA 139 11/27/2020   K 4.4 11/27/2020   CO2 22 11/27/2020   GLUCOSE 192 (H) 11/27/2020   BUN 61 (H) 11/27/2020   CREATININE 2.83 (H) 11/27/2020   BILITOT 0.7 01/04/2020   ALKPHOS 81 01/04/2020   AST 17 01/04/2020   ALT 18  01/04/2020   PROT 6.0 01/04/2020   ALBUMIN 3.8 01/04/2020   CALCIUM 9.1 11/27/2020   ANIONGAP 10 04/08/2020   GFR 24.40 (L) 11/27/2020   Lab Results  Component Value Date   CHOL 107 10/28/2018   Lab Results  Component Value Date   HDL 35.10 (L) 10/28/2018   Lab Results  Component Value Date   LDLCALC 60 10/28/2018   Lab Results  Component Value Date   TRIG 58.0 10/28/2018   Lab Results  Component Value Date   CHOLHDL 3 10/28/2018   Lab Results  Component Value Date   HGBA1C 6.8 (A) 01/04/2021      Assessment & Plan:   Problem List Items Addressed This Visit   None Visit Diagnoses     Right upper quadrant abdominal pain    -  Primary   Relevant Orders   Amylase   Basic metabolic panel   CBC   Hepatic function panel   US Abdomen Complete   Strain of abdominal wall, initial encounter           No orders of the defined types were placed in this encounter.   Follow-up: Return in about 3 months (around 05/30/2021), or if symptoms worsen or fail to improve, for will consider PT for abdominal wall strengthening. [name], MD

## 2021-02-28 LAB — HEPATIC FUNCTION PANEL
ALT: 33 U/L (ref 0–53)
AST: 18 U/L (ref 0–37)
Albumin: 4.1 g/dL (ref 3.5–5.2)
Alkaline Phosphatase: 90 U/L (ref 39–117)
Bilirubin, Direct: 0.1 mg/dL (ref 0.0–0.3)
Total Bilirubin: 0.5 mg/dL (ref 0.2–1.2)
Total Protein: 6.6 g/dL (ref 6.0–8.3)

## 2021-02-28 LAB — CBC
HCT: 36.9 % — ABNORMAL LOW (ref 39.0–52.0)
Hemoglobin: 12.2 g/dL — ABNORMAL LOW (ref 13.0–17.0)
MCHC: 33.1 g/dL (ref 30.0–36.0)
MCV: 89 fl (ref 78.0–100.0)
Platelets: 120 10*3/uL — ABNORMAL LOW (ref 150.0–400.0)
RBC: 4.14 Mil/uL — ABNORMAL LOW (ref 4.22–5.81)
RDW: 13.3 % (ref 11.5–15.5)
WBC: 5.8 10*3/uL (ref 4.0–10.5)

## 2021-02-28 LAB — BASIC METABOLIC PANEL
BUN: 37 mg/dL — ABNORMAL HIGH (ref 6–23)
CO2: 26 mEq/L (ref 19–32)
Calcium: 8.8 mg/dL (ref 8.4–10.5)
Chloride: 106 mEq/L (ref 96–112)
Creatinine, Ser: 2.15 mg/dL — ABNORMAL HIGH (ref 0.40–1.50)
GFR: 33.87 mL/min — ABNORMAL LOW (ref >60.00)
Glucose, Bld: 124 mg/dL — ABNORMAL HIGH (ref 70–99)
Potassium: 4.8 mEq/L (ref 3.5–5.1)
Sodium: 139 mEq/L (ref 135–145)

## 2021-02-28 LAB — AMYLASE: Amylase: 38 U/L (ref 27–131)

## 2021-03-02 ENCOUNTER — Ambulatory Visit (HOSPITAL_BASED_OUTPATIENT_CLINIC_OR_DEPARTMENT_OTHER): Payer: PPO

## 2021-03-12 ENCOUNTER — Telehealth: Payer: Self-pay

## 2021-03-12 DIAGNOSIS — E109 Type 1 diabetes mellitus without complications: Secondary | ICD-10-CM | POA: Diagnosis not present

## 2021-03-12 NOTE — Progress Notes (Signed)
Chronic Care Management Pharmacy Assistant   Name: Adrian Neal  MRN: [id number] DOB: 1966-02-02  Reason for Encounter:Hypertension Disease State Call.   Recent office visits:  02/27/2021 Dr.Kremer MD (PCP) No Medication Changes noted.  Recent consult visits:  No recent [facility] visits:  None in previous 6 months  Medications: Outpatient Encounter Medications as of 03/12/2021  Medication Sig   alendronate (FOSAMAX) 70 MG tablet TAKE 1 TABLET BY MOUTH EVERY 7 DAYS -  TAKE  WITH  A  FULL  GLASS  OF  WATER  ON  AN  EMPTY  STOMACH   atorvastatin (LIPITOR) 40 MG tablet TAKE 1 TABLET BY MOUTH ONCE DAILY AT  6  PM   Calcium Carbonate-Vitamin D 600-400 MG-UNIT tablet Take 1 tablet by mouth 2 (two) times daily.    carvedilol (COREG) 25 MG tablet Take 25 mg by mouth 2 (two) times daily.   chlorthalidone (HYGROTON) 25 MG tablet Take 12.5 mg by mouth daily.   cloNIDine (CATAPRES) 0.2 MG tablet Take 1 tablet by mouth twice daily   clopidogrel (PLAVIX) 75 MG tablet TAKE 1 TABLET BY MOUTH ONCE DAILY **ASPIRIN  AND  PLAVIX  FOR  3  MONTHS,  AFTER  THAT  -STOP  ASPIRIN-  TAKE  PLAVIX  ALONE   Continuous Blood Gluc Receiver (DEXCOM G6 RECEIVER) DEVI 1 Device by Does not apply route as directed.   Continuous Blood Gluc Sensor (DEXCOM G6 SENSOR) MISC 3 Devices by Does not apply route as directed.   Continuous Blood Gluc Transmit (DEXCOM G6 TRANSMITTER) MISC Use to monitor blood sugar. Change every 90 days.   fluticasone (FLONASE) 50 MCG/ACT nasal spray Place 2 sprays into both nostrils daily.   glucose blood (ONETOUCH VERIO) test strip 1 each by Other route 3 (three) times daily. Use as instructed   hydrALAZINE (APRESOLINE) 10 MG tablet Take 1 tablet by mouth once daily   Insulin Infusion Pump (T:SLIM INSULIN PUMP) DEVI by Does not apply route.   insulin lispro (HUMALOG) 100 UNIT/ML injection Max daily dose of 100 units via pump DX E10.59 (vials)   lisinopril (ZESTRIL) 20 MG  tablet Take 1 tablet by mouth once daily (Patient taking differently: 40 mg.)   pantoprazole (PROTONIX) 40 MG tablet Take 1 tablet by mouth once daily   spironolactone (ALDACTONE) 25 MG tablet Take 0.5 tablets (12.5 mg total) by mouth daily.   No facility-administered encounter medications on file as of 03/12/2021.    Care Gaps: Hepatitis C Screening Shingrix Vaccine HTN: 142/68 on 11/27/2020 [facility]: Lisinopril 20 mg 30 day supply last filled 01/29/2021 at [facility]. Atorvastatin 40 mg 90 day supply last filled 09/23//2022 at [facility]. Medication fill gaps: Clonidine 0.2 MG 90 day supply last filled 07/12/2020.    Reviewed chart prior to disease state call. Spoke with patient regarding BP  Recent Office Vitals: BP Readings from Last 3 Encounters:  02/27/21 (!) 142/68  11/27/20 (!) 146/64  07/03/20 124/62   Pulse Readings from Last 3 Encounters:  02/27/21 77  11/27/20 79  07/03/20 78    Wt Readings from Last 3 Encounters:  02/27/21 193 lb 6.4 oz (87.7 kg)  11/27/20 195 lb 6.4 oz (88.6 kg)  09/12/20 197 lb (89.4 kg)     Kidney Function Lab Results  Component Value Date/Time   CREATININE 2.15 (H) 02/27/2021 04:00 PM   CREATININE 2.83 (H) 11/27/2020 04:23 PM   CREATININE 2.55 (H) 06/09/2020 03:58 PM  CREATININE 2.07 (H) 01/08/2019 04:38 PM   GFR 33.87 (L) 02/27/2021 04:00 PM   GFRNONAA 36 (L) 04/08/2020 02:37 AM   GFRAA 35 (L) 01/03/2019 07:28 AM    BMP Latest Ref Rng & Units 02/27/2021 11/27/2020 07/03/2020  Glucose 70 - 99 mg/dL 124(H) 192(H) 163(H)  BUN 6 - 23 mg/dL 37(H) 61(H) 49(H)  Creatinine 0.40 - 1.50 mg/dL 2.15(H) 2.83(H) 2.39(H)  BUN/Creat Ratio 6 - 22 (calc) - - -  Sodium 135 - 145 mEq/L 139 139 141  Potassium 3.5 - 5.1 mEq/L 4.8 4.4 4.6  Chloride 96 - 112 mEq/L 106 105 109  CO2 19 - 32 mEq/L 26 22 25   Calcium 8.4 - 10.5 mg/dL 8.8 9.1 8.6    Current antihypertensive regimen:  Lisinopril 20 mg one table  daily Spironolactone 25 mg take 0.5 mg daily Carvedilol 25 mg one tablet twice daily Chlorthalidone 25 mg one tablet daily How often are you checking your Blood Pressure? daily Current home BP readings:  Patient states his blood pressure ranges around 130-140's/60's. What recent interventions/DTPs have been made by any provider to improve Blood Pressure control since last CPP Visit: None ID Any recent hospitalizations or ED visits since last visit with CPP? No What diet changes have been made to improve Blood Pressure Control?  Patient denies any changes with his diet. What exercise is being done to improve your Blood Pressure Control?  Patient denies any changes with his exercise.   Adherence Review: Is the patient currently on ACE/ARB medication? Yes Does the patient have >5 day gap between last estimated fill dates? No  Anderson Malta Clinical Production designer, theatre/television/film [phone]

## 2021-03-29 ENCOUNTER — Other Ambulatory Visit: Payer: Self-pay | Admitting: Family Medicine

## 2021-03-29 DIAGNOSIS — I10 Essential (primary) hypertension: Secondary | ICD-10-CM

## 2021-04-05 ENCOUNTER — Encounter: Payer: Self-pay | Admitting: Family Medicine

## 2021-04-06 DIAGNOSIS — I129 Hypertensive chronic kidney disease with stage 1 through stage 4 chronic kidney disease, or unspecified chronic kidney disease: Secondary | ICD-10-CM | POA: Diagnosis not present

## 2021-04-06 DIAGNOSIS — D631 Anemia in chronic kidney disease: Secondary | ICD-10-CM | POA: Diagnosis not present

## 2021-04-06 DIAGNOSIS — Z8673 Personal history of transient ischemic attack (TIA), and cerebral infarction without residual deficits: Secondary | ICD-10-CM | POA: Diagnosis not present

## 2021-04-06 DIAGNOSIS — E1022 Type 1 diabetes mellitus with diabetic chronic kidney disease: Secondary | ICD-10-CM | POA: Diagnosis not present

## 2021-04-06 DIAGNOSIS — N184 Chronic kidney disease, stage 4 (severe): Secondary | ICD-10-CM | POA: Diagnosis not present

## 2021-04-06 DIAGNOSIS — N2581 Secondary hyperparathyroidism of renal origin: Secondary | ICD-10-CM | POA: Diagnosis not present

## 2021-04-10 ENCOUNTER — Encounter: Payer: Self-pay | Admitting: Family Medicine

## 2021-04-11 DIAGNOSIS — E109 Type 1 diabetes mellitus without complications: Secondary | ICD-10-CM | POA: Diagnosis not present

## 2021-04-12 ENCOUNTER — Other Ambulatory Visit: Payer: Self-pay

## 2021-04-12 ENCOUNTER — Ambulatory Visit (INDEPENDENT_AMBULATORY_CARE_PROVIDER_SITE_OTHER): Payer: PPO | Admitting: Family Medicine

## 2021-04-12 ENCOUNTER — Ambulatory Visit (INDEPENDENT_AMBULATORY_CARE_PROVIDER_SITE_OTHER): Payer: PPO

## 2021-04-12 ENCOUNTER — Ambulatory Visit (HOSPITAL_BASED_OUTPATIENT_CLINIC_OR_DEPARTMENT_OTHER)
Admission: RE | Admit: 2021-04-12 | Discharge: 2021-04-12 | Disposition: A | Discharge status: Home / Self Care | Payer: PPO | Source: Ambulatory Visit | Attending: Family Medicine | Admitting: Family Medicine

## 2021-04-12 ENCOUNTER — Encounter: Payer: Self-pay | Admitting: Family Medicine

## 2021-04-12 VITALS — BP 160/60 | HR 83 | Temp 96.9°F | Ht 67.0 in | Wt 194.4 lb

## 2021-04-12 DIAGNOSIS — M545 Low back pain, unspecified: Secondary | ICD-10-CM | POA: Diagnosis not present

## 2021-04-12 DIAGNOSIS — R1011 Right upper quadrant pain: Secondary | ICD-10-CM | POA: Diagnosis not present

## 2021-04-12 DIAGNOSIS — I10 Essential (primary) hypertension: Secondary | ICD-10-CM

## 2021-04-12 DIAGNOSIS — M4186 Other forms of scoliosis, lumbar region: Secondary | ICD-10-CM | POA: Diagnosis not present

## 2021-04-12 DIAGNOSIS — K824 Cholesterolosis of gallbladder: Secondary | ICD-10-CM | POA: Diagnosis not present

## 2021-04-12 MED ORDER — METHOCARBAMOL 500 MG PO TABS: 500.0000 mg | ORAL_TABLET | Freq: Three times a day (TID) | ORAL | 0 refills | Status: DC | PRN

## 2021-04-12 NOTE — Progress Notes (Shared)
[facility] Note  04/16/2021     CHIEF COMPLAINT Patient presents for No chief complaint on file.   HISTORY OF PRESENT ILLNESS: Adrian Neal is a 55 y.o. male who presents to the clinic today for:    pt states he has not found a general ophthalmologist yet, he states sometimes when he is watching TV, he has to focus harder to see  Referring physician: [name], MD [place],  [place] [address]  HISTORICAL INFORMATION:   Selected notes from the MEDICAL RECORD NUMBER Referred by Dr. [name] for DM exam LEE:  Ocular Hx-cataract OD, pseudo OS PMH-DM (type 1, A1C: 8.6, takes novolog), heart murmur, HLD, HTN, stroke    CURRENT MEDICATIONS: No current outpatient medications on file. (Ophthalmic Drugs)   No current facility-administered medications for this visit. (Ophthalmic Drugs)   Current Outpatient Medications (Other)  Medication Sig   alendronate (FOSAMAX) 70 MG tablet TAKE 1 TABLET BY MOUTH EVERY 7 DAYS -  TAKE  WITH  A  FULL  GLASS  OF  WATER  ON  AN  EMPTY  STOMACH   atorvastatin (LIPITOR) 40 MG tablet TAKE 1 TABLET BY MOUTH ONCE DAILY AT  6  PM   Calcium Carbonate-Vitamin D 600-400 MG-UNIT tablet Take 1 tablet by mouth 2 (two) times daily.    carvedilol (COREG) 25 MG tablet Take 25 mg by mouth 2 (two) times daily.   chlorthalidone (HYGROTON) 25 MG tablet Take 12.5 mg by mouth daily.   cloNIDine (CATAPRES) 0.2 MG tablet Take 1 tablet by mouth twice daily   clopidogrel (PLAVIX) 75 MG tablet TAKE 1 TABLET BY MOUTH ONCE DAILY **ASPIRIN  AND  PLAVIX  FOR  3  MONTHS,  AFTER  THAT  -STOP  ASPIRIN-  TAKE  PLAVIX  ALONE   Continuous Blood Gluc Receiver (DEXCOM G6 RECEIVER) DEVI 1 Device by Does not apply route as directed.   Continuous Blood Gluc Sensor (DEXCOM G6 SENSOR) MISC 3 Devices by Does not apply route as directed.   Continuous Blood Gluc Transmit (DEXCOM G6 TRANSMITTER) MISC Use to monitor blood  sugar. Change every 90 days.   fluticasone (FLONASE) 50 MCG/ACT nasal spray Place 2 sprays into both nostrils daily.   glucose blood (ONETOUCH VERIO) test strip 1 each by Other route 3 (three) times daily. Use as instructed   hydrALAZINE (APRESOLINE) 10 MG tablet Take 1 tablet by mouth once daily   Insulin Infusion Pump (T:SLIM INSULIN PUMP) DEVI by Does not apply route.   insulin lispro (HUMALOG) 100 UNIT/ML injection Max daily dose of 100 units via pump DX E10.59 (vials)   lisinopril (ZESTRIL) 20 MG tablet Take 1 tablet by mouth once daily (Patient taking differently: 40 mg.)   pantoprazole (PROTONIX) 40 MG tablet Take 1 tablet by mouth once daily   spironolactone (ALDACTONE) 25 MG tablet Take 0.5 tablets (12.5 mg total) by mouth daily.   No current facility-administered medications for this visit. 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[place])    PDR OU   Sleep apnea    on BiPAP   Stroke [facility]) 2016   Past Surgical History:  Procedure Laterality Date   ANTERIOR APPROACH HEMI HIP ARTHROPLASTY Left 01/01/2019   Procedure: ANTERIOR APPROACH total hip ARTHROPLASTY;  Surgeon: Rod Can, MD;  Location: [place];  Service: Orthopedics;  Laterality: Left;   CATARACT EXTRACTION Right 1994   CATARACT EXTRACTION W/ INTRAOCULAR LENS IMPLANT  1994   EYE SURGERY Right 1991   vitrectomy   EYE SURGERY Right 1992   scar tissue removed from retina   EYE SURGERY Right 1994   Cat Sx    FAMILY HISTORY Family History  Problem Relation Age of Onset   Hypertension Mother     Hyperlipidemia Mother    Hyperlipidemia Father    Hypertension Father    Diabetes Father    Kidney disease Father        had a kidney transplant    Stroke Brother    Diabetes Brother    Pulmonary fibrosis Paternal Grandmother    Lung cancer Paternal Grandfather    Diabetes Daughter    Colon cancer Neg Hx    Esophageal cancer Neg Hx    Rectal cancer Neg Hx    Stomach cancer Neg Hx     SOCIAL HISTORY Social History   Tobacco Use   Smoking status: Former    Packs/day: 1.00    Years: 6.00    Pack years: 6.00    Types: Cigarettes    Quit date: 04/30/1991    Years since quitting: 29.9   Smokeless tobacco: Never  Vaping Use   Vaping Use: Never used  Substance Use Topics   Alcohol use: No   Drug use: Never         OPHTHALMIC EXAM:  Not recorded     IMAGING AND PROCEDURES  Imaging and Procedures for @TODAY @            ASSESSMENT/PLAN:  No diagnosis found.   1. 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 Zigmund Daniel once in 2013 and then pt lost insurance and has not had regular eye care since  - exam shows extensive PRP and regressed fibrosis/NV OD -- no significant edema  - good PRP in place OU  - FA (3.17.2021) shows scattered MA, but no active NV  - OCT without diabetic macular edema, both eyes   - BCVA 20/60 OD (stable); 20/25 OS  - OD likely affected by retinal ischemia vs ischemic optic atrophy  - discussed findings and prognosis  - no intervention indicated at this time  - recommend monitoring for now  - f/u in 1 year, sooner prn -- DFE/OCT  2,3. Hypertensive retinopathy OU  - discussed importance of tight BP control  - monitor  4. Pseudophakia OD  - s/p CE/IOL OD by surgeon [facility] [phone]  - doing well  - monitor  5. Mixed Cataract OS  - The symptoms of cataract, surgical options, and treatments and risks were  discussed with patient.  - discussed diagnosis and progression  - not yet visually significant  - monitor for now   Ophthalmic Meds Ordered this visit:  No orders of the defined types were placed in this encounter.  No follow-ups on file.  There are no Patient Instructions on file for this visit.   Explained the diagnoses, plan, and follow up with the patient and they expressed understanding.  Patient expressed understanding of the importance of proper follow up care.   This document serves as a record of services personally performed by Gardiner Sleeper, MD, PhD. It was created on their behalf by Roselee Nova, COMT. The creation of this record is the provider's dictation and/or activities during the visit.  Electronically signed by: Roselee Nova, COMT 04/12/21 9:22 AM    Gardiner Sleeper, M.D., Ph.D. Diseases & Surgery of the Retina and Vitreous [facility]: M myopia (nearsighted); A astigmatism; H hyperopia (farsighted); P presbyopia; Mrx spectacle prescription;  CTL contact lenses; OD right eye; OS left eye; OU both eyes  XT exotropia; ET esotropia; PEK punctate epithelial keratitis; PEE punctate epithelial erosions; DES dry eye syndrome; MGD meibomian gland dysfunction; ATs artificial tears; PFAT's preservative free artificial tears; [place] nuclear sclerotic cataract; PSC posterior subcapsular cataract; ERM epi-retinal membrane; PVD posterior vitreous detachment; RD retinal detachment; DM diabetes mellitus; DR diabetic retinopathy; NPDR non-proliferative diabetic retinopathy; PDR proliferative diabetic retinopathy; CSME clinically significant macular edema; DME diabetic macular edema; dbh dot blot hemorrhages; CWS cotton wool spot; POAG primary open angle glaucoma; C/D cup-to-disc ratio; HVF humphrey visual field; GVF goldmann visual field; OCT optical coherence tomography; IOP intraocular pressure; BRVO Branch retinal vein occlusion; CRVO  central retinal vein occlusion; CRAO central retinal artery occlusion; BRAO branch retinal artery occlusion; RT retinal tear; SB scleral buckle; PPV pars plana vitrectomy; VH Vitreous hemorrhage; PRP panretinal laser photocoagulation; IVK intravitreal kenalog; VMT vitreomacular traction; MH Macular hole;  NVD neovascularization of the disc; NVE neovascularization elsewhere; AREDS age related eye disease study; ARMD age related macular degeneration; POAG primary open angle glaucoma; EBMD epithelial/anterior basement membrane dystrophy; ACIOL anterior chamber intraocular lens; IOL intraocular lens; PCIOL posterior chamber intraocular lens; Phaco/IOL phacoemulsification with intraocular lens placement; [place] photorefractive keratectomy; LASIK laser assisted in situ keratomileusis; HTN hypertension; DM diabetes mellitus; COPD chronic obstructive pulmonary disease

## 2021-04-12 NOTE — Progress Notes (Signed)
Established Patient Office Visit  Subjective:  Patient ID: Adrian Neal, male    DOB: January 19, 1966  Age: 55 y.o. MRN: [id number]  CC:  Chief Complaint  Patient presents with   Back Pain    Pt c/o Sharp lower back pain x1 week, pain is worse when turning body to left or right. Pain range 2-8    HPI Adrian Neal presents for evaluation and treatment of a 1 week history of lower back pain.  Located more on the left side.  There was no injury.  Pain is nonradiating.  Denies numbness tingling or weakness.  No changes in bowel or bladder function.  Standing and walking does not affected.  However, rotational movement to the left and right does seem to bother.  Denies dysuria frequency urgency or difficulty with urination.  Was late taking his blood pressure medicines this morning.  He just took them about an hour ago.  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[place])    PDR OU   Sleep apnea    on BiPAP   Stroke ([place]) 2016    Past Surgical History:  Procedure Laterality Date   ANTERIOR APPROACH HEMI HIP ARTHROPLASTY Left 01/01/2019   Procedure: ANTERIOR APPROACH total hip ARTHROPLASTY;  Surgeon: Rod Can, MD;  Location: [facility];  Service: Orthopedics;  Laterality: Left;   CATARACT EXTRACTION Right 1994   CATARACT EXTRACTION W/ INTRAOCULAR LENS IMPLANT  1994   EYE SURGERY Right 1991   vitrectomy   EYE SURGERY Right 1992   scar tissue removed from retina   EYE SURGERY Right 1994   Cat Sx    Family History  Problem Relation Age of Onset   Hypertension Mother    Hyperlipidemia Mother    Hyperlipidemia Father    Hypertension Father    Diabetes  Father    Kidney disease Father        had a kidney transplant    Stroke Brother    Diabetes Brother    Pulmonary fibrosis Paternal Grandmother    Lung cancer Paternal Grandfather    Diabetes Daughter    Colon cancer Neg Hx    Esophageal cancer Neg Hx    Rectal cancer Neg Hx    Stomach cancer Neg Hx     Social History   Socioeconomic History   Marital status: Married    Spouse name: Not on file   Number of children: 3   Years of education: Not on file   Highest education level: Not on file  Occupational History   Occupation: Disabled  Tobacco Use   Smoking status: Former    Packs/day: 1.00    Years: 6.00    Pack years: 6.00    Types: Cigarettes    Quit date: 04/30/1991    Years since quitting: 29.9   Smokeless tobacco: Never  Vaping Use   Vaping Use: Never used  Substance and Sexual Activity   Alcohol use: No   Drug use: Never   Sexual activity: Not on file  Other Topics Concern   Not on file  Social History Narrative   Right handed   Lives wife and kids in a two story home   Social Determinants of Health   Financial Resource Strain: Not on file  Food Insecurity: No Food Insecurity   Worried About Charity fundraiser in the Last Year: Never true   Ran Out of Food in the Last Year: Never true  Transportation Needs: Not on file  Physical Activity: Insufficiently Active   Days of Exercise per Week: 2 days   Minutes of Exercise per Session: 60 min  Stress: No Stress Concern Present   Feeling of Stress : Not at all  Social Connections: Socially Integrated   Frequency of Communication with Friends and Family: More than three times a week   Frequency of Social Gatherings with Friends and Family: More than three times a week   Attends Religious Services: More than 4 times per year   Active Member of [employer] or Organizations: Yes   Attends Music therapist: More than 4 times per year   Marital Status: Married  Human resources officer Violence: Not At Risk    Fear of Current or Ex-Partner: No   Emotionally Abused: No   Physically Abused: No   Sexually Abused: No    Outpatient Medications Prior to Visit  Medication Sig Dispense Refill   alendronate (FOSAMAX) 70 MG tablet TAKE 1 TABLET BY MOUTH EVERY 7 DAYS -  TAKE  WITH  A  FULL  GLASS  OF  WATER  ON  AN  EMPTY  STOMACH 12 tablet 1   atorvastatin (LIPITOR) 40 MG tablet TAKE 1 TABLET BY MOUTH ONCE DAILY AT  6  PM 90 tablet 0   Calcium Carbonate-Vitamin D 600-400 MG-UNIT tablet Take 1 tablet by mouth 2 (two) times daily.      carvedilol (COREG) 25 MG tablet Take 25 mg by mouth 2 (two) times daily.     chlorthalidone (HYGROTON) 25 MG tablet Take 12.5 mg by mouth daily.     cloNIDine (CATAPRES) 0.2 MG tablet Take 1 tablet by mouth twice daily 180 tablet 0   clopidogrel (PLAVIX) 75 MG tablet TAKE 1 TABLET BY MOUTH ONCE DAILY **ASPIRIN  AND  PLAVIX  FOR  3  MONTHS,  AFTER  THAT  -STOP  ASPIRIN-  TAKE  PLAVIX  ALONE 90 tablet 0   Continuous Blood Gluc Receiver (DEXCOM G6 RECEIVER) DEVI 1 Device by Does not apply route as directed. 1 Device 0   Continuous Blood Gluc Sensor (DEXCOM G6 SENSOR) MISC 3 Devices by Does not apply route as directed. 3 each 6   Continuous Blood Gluc Transmit (DEXCOM G6 TRANSMITTER) MISC Use to monitor blood sugar. Change every 90 days. 1 each 2   fluticasone (FLONASE) 50 MCG/ACT nasal spray Place 2 sprays into both nostrils daily.     glucose blood (ONETOUCH VERIO) test strip 1 each by Other route 3 (three) times daily. Use as instructed 300 each 3   hydrALAZINE (APRESOLINE) 10 MG tablet Take 1 tablet by mouth once daily 30 tablet 0   Insulin Infusion Pump (T:SLIM INSULIN PUMP) DEVI by Does not apply route.     insulin lispro (HUMALOG) 100 UNIT/ML injection Max daily dose of 100 units via pump DX E10.59 (vials) 90 mL 4   lisinopril (ZESTRIL) 40 MG tablet Take 40 mg by mouth daily.     pantoprazole (PROTONIX) 40 MG tablet Take 1 tablet by mouth once daily 90 tablet 0    spironolactone (ALDACTONE) 25  MG tablet Take 0.5 tablets (12.5 mg total) by mouth daily. 15 tablet 11   lisinopril (ZESTRIL) 20 MG tablet Take 1 tablet by mouth once daily (Patient taking differently: 40 mg.) 90 tablet 0   No facility-administered medications prior to visit.    No Known Allergies  ROS Review of Systems  Constitutional:  Negative for chills, diaphoresis, fatigue, fever and unexpected weight change.  Eyes:  Negative for photophobia and visual disturbance.  Respiratory: Negative.    Cardiovascular: Negative.   Gastrointestinal: Negative.   Genitourinary:  Negative for difficulty urinating, dysuria, flank pain, frequency and urgency.  Musculoskeletal:  Positive for back pain and myalgias. Negative for gait problem.  Neurological:  Negative for speech difficulty, weakness, numbness and headaches.  Psychiatric/Behavioral: Negative.       Objective:    Physical Exam Vitals and nursing note reviewed.  Constitutional:      General: He is not in acute distress.    Appearance: Normal appearance. He is not ill-appearing, toxic-appearing or diaphoretic.  HENT:     Head: Normocephalic and atraumatic.     Right Ear: External ear normal.     Left Ear: External ear normal.  Pulmonary:     Effort: Pulmonary effort is normal.  Musculoskeletal:     Thoracic back: No spasms, tenderness or bony tenderness. Normal range of motion.     Lumbar back: No deformity, spasms or bony tenderness. Normal range of motion. Negative right straight leg raise test and negative left straight leg raise test.  Neurological:     Mental Status: He is alert and oriented to person, place, and time.     Motor: No weakness.     Deep Tendon Reflexes:     Reflex Scores:      Patellar reflexes are 1+ on the right side and 1+ on the left side.      Achilles reflexes are 1+ on the right side and 1+ on the left side.   BP (!) 160/60 (BP Location: Left Arm, Patient Position: Sitting, Cuff Size: Normal)     Pulse 83    Temp (!) 96.9 F (36.1 C) (Temporal)    Ht 5\' 7"  (1.702 m)    Wt 194 lb 6.4 oz (88.2 kg)    SpO2 98%    BMI 30.45 kg/m  Wt Readings from Last 3 Encounters:  04/12/21 194 lb 6.4 oz (88.2 kg)  02/27/21 193 lb 6.4 oz (87.7 kg)  11/27/20 195 lb 6.4 oz (88.6 kg)     Health Maintenance Due  Topic Date Due   Hepatitis C Screening  Never done   Zoster Vaccines- Shingrix (2 of 2) 01/22/2021    There are no preventive care reminders to display for this patient.  Lab Results  Component Value Date   TSH 3.18 01/04/2020   Lab Results  Component Value Date   WBC 5.8 02/27/2021   HGB 12.2 (L) 02/27/2021   HCT 36.9 (L) 02/27/2021   MCV 89.0 02/27/2021   PLT 120.0 (L) 02/27/2021   Lab Results  Component Value Date   NA 139 02/27/2021   K 4.8 02/27/2021   CO2 26 02/27/2021   GLUCOSE 124 (H) 02/27/2021   BUN 37 (H) 02/27/2021   CREATININE 2.15 (H) 02/27/2021   BILITOT 0.5 02/27/2021   ALKPHOS 90 02/27/2021   AST 18 02/27/2021   ALT 33 02/27/2021   PROT 6.6 02/27/2021   ALBUMIN 4.1 02/27/2021   CALCIUM 8.8 02/27/2021   ANIONGAP 10 04/08/2020  GFR 33.87 (L) 02/27/2021   Lab Results  Component Value Date   CHOL 107 10/28/2018   Lab Results  Component Value Date   HDL 35.10 (L) 10/28/2018   Lab Results  Component Value Date   LDLCALC 60 10/28/2018   Lab Results  Component Value Date   TRIG 58.0 10/28/2018   Lab Results  Component Value Date   CHOLHDL 3 10/28/2018   Lab Results  Component Value Date   HGBA1C 6.8 (A) 01/04/2021      Assessment & Plan:   Problem List Items Addressed This Visit       Cardiovascular and Mediastinum   Essential hypertension   Relevant Medications   lisinopril (ZESTRIL) 40 MG tablet     Other   Acute left-sided low back pain without sciatica - Primary   Relevant Medications   methocarbamol (ROBAXIN) 500 MG tablet   Other Relevant Orders   DG Lumbar Spine Complete    Meds ordered this encounter  Medications    methocarbamol (ROBAXIN) 500 MG tablet    Sig: Take 1 tablet (500 mg total) by mouth every 8 (eight) hours as needed for muscle spasms.    Dispense:  40 tablet    Refill:  0     Follow-up: Return in about 2 weeks (around 04/26/2021), or if symptoms worsen or fail to improve, for and take 2 tylenol every 8 hours. [name], MD

## 2021-04-16 ENCOUNTER — Encounter (INDEPENDENT_AMBULATORY_CARE_PROVIDER_SITE_OTHER): Payer: PPO | Admitting: Ophthalmology

## 2021-04-16 NOTE — Progress Notes (Signed)
[facility] Note  04/20/2021     CHIEF COMPLAINT Patient presents for Retina Follow Up   HISTORY OF PRESENT ILLNESS: Adrian Neal is a 55 y.o. male who presents to the clinic today for:   HPI     Retina Follow Up   Patient presents with  Diabetic Retinopathy.  In both eyes.  This started 1 year ago.  I, the attending physician,  performed the HPI with the patient and updated documentation appropriately.        Comments   Patient here for 1 year retina follow up for PDR OU. Patient states vision about the same. No eye pain.       Last edited by [name], MD on 04/20/2021  1:46 PM.     pt states no problems with vision in the last year, he felt like he saw better on the eye chart today, no new health concerns of hospitalizations, no problems with floaters in either eye, pt has been going to [facility] for routine care, he saw them in October  Referring physician: [name], MD [address],  [place] [phone]  HISTORICAL INFORMATION:   Selected notes from the MEDICAL RECORD NUMBER Referred by Dr. [name] for DM exam LEE:  Ocular Hx-cataract OD, pseudo OS PMH-DM (type 1, A1C: 8.6, takes novolog), heart murmur, HLD, HTN, stroke    CURRENT MEDICATIONS: No current outpatient medications on file. (Ophthalmic Drugs)   No current facility-administered medications for this visit. (Ophthalmic Drugs)   Current Outpatient Medications (Other)  Medication Sig   alendronate (FOSAMAX) 70 MG tablet TAKE 1 TABLET BY MOUTH EVERY 7 DAYS -  TAKE  WITH  A  FULL  GLASS  OF  WATER  ON  AN  EMPTY  STOMACH   atorvastatin (LIPITOR) 40 MG tablet TAKE 1 TABLET BY MOUTH ONCE DAILY AT  6  PM   carvedilol (COREG) 25 MG tablet Take 25 mg by mouth 2 (two) times daily.   chlorthalidone (HYGROTON) 25 MG tablet Take 12.5 mg by mouth daily.   clopidogrel (PLAVIX) 75 MG tablet TAKE 1 TABLET BY MOUTH ONCE DAILY  **ASPIRIN  AND  PLAVIX  FOR  3  MONTHS,  AFTER  THAT  -STOP  ASPIRIN-  TAKE  PLAVIX  ALONE   Continuous Blood Gluc Receiver (DEXCOM G6 RECEIVER) DEVI 1 Device by Does not apply route as directed.   Continuous Blood Gluc Sensor (DEXCOM G6 SENSOR) MISC 3 Devices by Does not apply route as directed.   Continuous Blood Gluc Transmit (DEXCOM G6 TRANSMITTER) MISC Use to monitor blood sugar. Change every 90 days.   glucose blood (ONETOUCH VERIO) test strip 1 each by Other route 3 (three) times daily. Use as instructed   hydrALAZINE (APRESOLINE) 10 MG tablet Take 1 tablet by mouth once daily   Insulin Infusion Pump (T:SLIM INSULIN PUMP) DEVI by Does not apply route.   insulin lispro (HUMALOG) 100 UNIT/ML injection Max daily dose of 100 units via pump DX E10.59 (vials)   lisinopril (ZESTRIL) 40 MG tablet Take 40 mg by mouth daily.   pantoprazole (PROTONIX) 40 MG tablet Take 1 tablet by mouth once daily   spironolactone (ALDACTONE) 25 MG tablet Take 0.5 tablets (12.5 mg total) by mouth daily.   Calcium Carbonate-Vitamin D 600-400 MG-UNIT tablet Take 1 tablet by mouth 2 (two) times daily.  (Patient not taking: Reported on 04/20/2021)   cloNIDine (CATAPRES) 0.2 MG tablet  Take 1 tablet by mouth twice daily (Patient not taking: Reported on 04/20/2021)   fluticasone (FLONASE) 50 MCG/ACT nasal spray Place 2 sprays into both nostrils daily. (Patient not taking: Reported on 04/20/2021)   methocarbamol (ROBAXIN) 500 MG tablet Take 1 tablet (500 mg total) by mouth every 8 (eight) hours as needed for muscle spasms. (Patient not taking: Reported on 04/20/2021)   No current facility-administered medications for this visit. (Other)   REVIEW OF SYSTEMS: ROS   Positive for: Neurological, Musculoskeletal, Endocrine, Eyes, Respiratory Negative for: Constitutional, Gastrointestinal, Skin, Genitourinary, HENT, Cardiovascular, Psychiatric, Allergic/Imm, Heme/Lymph Last edited by Theodore Demark, COA on 04/20/2021  8:53 AM.      ALLERGIES No Known Allergies  PAST MEDICAL HISTORY Past Medical History:  Diagnosis Date   Cataract    Mixed form OS   Chronic kidney disease    Diabetes mellitus without complication ([place])    diagnosed at age 19   Eye problems    Heart murmur 1996   High cholesterol    patient denies but take preventative medicine   Hypertension    Hypertensive retinopathy    OU   Retinopathy due to secondary diabetes mellitus ([place])    PDR OU   Sleep apnea    on BiPAP   Stroke ([place]) 2016   Past Surgical History:  Procedure Laterality Date   ANTERIOR APPROACH HEMI HIP ARTHROPLASTY Left 01/01/2019   Procedure: ANTERIOR APPROACH total hip ARTHROPLASTY;  Surgeon: Rod Can, MD;  Location: [facility];  Service: Orthopedics;  Laterality: Left;   CATARACT EXTRACTION Right 1994   CATARACT EXTRACTION W/ INTRAOCULAR LENS IMPLANT  1994   EYE SURGERY Right 1991   vitrectomy   EYE SURGERY Right 1992   scar tissue removed from retina   EYE SURGERY Right 1994   Cat Sx    FAMILY HISTORY Family History  Problem Relation Age of Onset   Hypertension Mother    Hyperlipidemia Mother    Hyperlipidemia Father    Hypertension Father    Diabetes Father    Kidney disease Father        had a kidney transplant    Stroke Brother    Diabetes Brother    Pulmonary fibrosis Paternal Grandmother    Lung cancer Paternal Grandfather    Diabetes Daughter    Colon cancer Neg Hx    Esophageal cancer Neg Hx    Rectal cancer Neg Hx    Stomach cancer Neg Hx     SOCIAL HISTORY Social History   Tobacco Use   Smoking status: Former    Packs/day: 1.00    Years: 6.00    Pack years: 6.00    Types: Cigarettes    Quit date: 04/30/1991    Years since quitting: 29.9   Smokeless tobacco: Never  Vaping Use   Vaping Use: Never used  Substance Use Topics   Alcohol use: No   Drug use: Never       OPHTHALMIC EXAM:  Base Eye Exam     Visual Acuity (Snellen - Linear)       Right Left   Dist cc 20/50  20/25 -2   Dist ph cc NI 20/25 +1    Correction: Glasses         Tonometry (Tonopen, 8:51 AM)       Right Left   Pressure 10 06         Pupils       Dark Light Shape React APD   Right  3 2 Round Brisk None   Left 3 2 Round Brisk None         Visual Fields (Counting fingers)       Left Right    Full Full         Extraocular Movement       Right Left    Full, Ortho Full, Ortho         Neuro/Psych     Oriented x3: Yes   Mood/Affect: Normal         Dilation     Both eyes: 1.0% Mydriacyl, 2.5% Phenylephrine @ 8:51 AM           Slit Lamp and Fundus Exam     Slit Lamp Exam       Right Left   Lids/Lashes dermatochalasis, Ptosis dermatochalasis   Conjunctiva/Sclera Mild Conjunctivochalasis Mild Conjunctivochalasis   Cornea fine endopigment, focal band K at 3:00, well healed temporal cataract wounds, mild arcus, mild tear film debris Clear   Anterior Chamber Deep and quiet deep, narrow temporal angle   Iris Round, poorly dilated 4.18mm, no NVI round, moderately dilated, no NVI, mild anterior bowing   Lens PC IOL in good position 2-3+Nuclear sclerosis with mild brunescence, 2-3+ Cortical cataract   Anterior Vitreous post vitrectomy, vitreous condensations vitreous syneresis         Fundus Exam       Right Left   Disc +cupping, mild Pallor, fibrosis at 4:30, peripapillary CR scarring at 9:00, Sharp rim Pink and Sharp, trace fibrosis   C/D Ratio 0.7 0.2   Macula Flat, Blunted foveal reflex, Retinal pigment epithelial mottling, Atrophy, focal peripapillary pigment clump nasal macula, No heme or edema Flat, blunted foveal reflex, mild RPE mottling, trace Epiretinal membrane, No heme or edema   Vessels Vascular attenuation, Tortuous Vascular attenuation   Periphery Attached, 360 PRP, No heme attached, 360 PRP to arcades, No heme           Refraction     Wearing Rx       Sphere Cylinder Axis Add   Right +2.25 +0.75 088 +2.50   Left +1.50  +0.75 092 +2.50    Type: PAL           IMAGING AND PROCEDURES  Imaging and Procedures for @TODAY @  OCT, Retina - OU - Both Eyes       Right Eye Quality was good. Central Foveal Thickness: 196. Progression has been stable. Findings include normal foveal contour, no IRF, no SRF, outer retinal atrophy, epiretinal membrane, retinal drusen (Stable from prior).   Left Eye Quality was good. Central Foveal Thickness: 261. Progression has been stable. Findings include normal foveal contour, no SRF, no IRF, epiretinal membrane, vitreomacular adhesion (Trace cystic changes, no DME ).   Notes *Images captured and stored on drive  Diagnosis / Impression:  Hx of PDR OU No DME OU ERM OU (OD>OS)   Clinical management:  See below  Abbreviations: NFP - Normal foveal profile. CME - cystoid macular edema. PED - pigment epithelial detachment. IRF - intraretinal fluid. SRF - subretinal fluid. EZ - ellipsoid zone. ERM - epiretinal membrane. ORA - outer retinal atrophy. ORT - outer retinal tubulation. SRHM - subretinal hyper-reflective material            ASSESSMENT/PLAN:    ICD-10-CM   1. 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 Essential hypertension  I10     3. Hypertensive retinopathy of both eyes  H35.033 OCT, Retina - OU - Both Eyes    4. Pseudophakia  Z96.1     5. Combined forms of age-related cataract of left eye  H25.812        1. DM1 w/ Proliferative diabetic retinopathy w/o DME, OU  - last A1c: 6.8 on 09.08.22  - history of extensive retinal work done in [place], [place] in 90s per pt -- including PPV w/ laser OD  - saw Dr. Zigmund Daniel once in 2013 and then pt lost insurance and has not had regular eye care since  - exam shows extensive PRP and regressed fibrosis/NV OD -- no significant edema  - good PRP in place OU  - FA (3.17.2021) shows scattered MA,  but no active NV  - OCT without diabetic macular edema, both eyes   - BCVA 20/50 OD (improved); 20/25 OS  - OD likely affected by retinal ischemia vs ischemic optic atrophy   - discussed findings and prognosis  - no intervention indicated at this time  - recommend monitoring for now  - f/u in 1 year, sooner prn -- DFE/OCT  2,3. Hypertensive retinopathy OU  - discussed importance of tight BP control  - monitor   4. Pseudophakia OD  - s/p CE/IOL OD by surgeon at [facility] [phone]  - doing well  - monitor   5. Mixed Cataract OS  - The symptoms of cataract, surgical options, and treatments and risks were discussed with patient.  - discussed diagnosis and progression  - under the expert management of [place]  - monitor   Ophthalmic Meds Ordered this visit:  No orders of the defined types were placed in this encounter.    Return in about 1 year (around 04/20/2022) for f/u PDR OU, DFE, OCT.  There are no Patient Instructions on file for this visit.   Explained the diagnoses, plan, and follow up with the patient and they expressed understanding.  Patient expressed understanding of the importance of proper follow up care.   This document serves as a record of services personally performed by Gardiner Sleeper, MD, PhD. It was created on their behalf by Leonie Douglas, an ophthalmic technician. The creation of this record is the provider's dictation and/or activities during the visit.    Electronically signed by: Leonie Douglas COA, 04/20/21  1:46 PM  This document serves as a record of services personally performed by Gardiner Sleeper, MD, PhD. It was created on their behalf by San Jetty. Owens Shark, OA an ophthalmic technician. The creation of this record is the provider's dictation and/or activities during the visit.    Electronically signed by: San Jetty. Owens Shark, [place] 12.23.2022 1:46 PM  Gardiner Sleeper, M.D., Ph.D. Diseases & Surgery of the Retina and [place] 04/20/2021  I have reviewed the above documentation for accuracy and completeness, and I agree with the above. Gardiner Sleeper, M.D., Ph.D. 04/20/21 1:48 PM   Abbreviations: M myopia (nearsighted); A astigmatism; H hyperopia (farsighted); P presbyopia; Mrx spectacle prescription;  CTL contact lenses; OD right eye; OS left eye; OU both eyes  XT exotropia; ET esotropia; PEK punctate epithelial keratitis; PEE punctate epithelial erosions; DES dry eye syndrome; MGD meibomian gland dysfunction; ATs artificial tears; PFAT's preservative free artificial tears; [place] nuclear sclerotic cataract; PSC posterior subcapsular cataract; ERM epi-retinal membrane; PVD posterior vitreous detachment; RD retinal detachment; DM diabetes  mellitus; DR diabetic retinopathy; NPDR non-proliferative diabetic retinopathy; PDR proliferative diabetic retinopathy; CSME clinically significant macular edema; DME diabetic macular edema; dbh dot blot hemorrhages; CWS cotton wool spot; POAG primary open angle glaucoma; C/D cup-to-disc ratio; HVF humphrey visual field; GVF goldmann visual field; OCT optical coherence tomography; IOP intraocular pressure; BRVO Branch retinal vein occlusion; CRVO central retinal vein occlusion; CRAO central retinal artery occlusion; BRAO branch retinal artery occlusion; RT retinal tear; SB scleral buckle; PPV pars plana vitrectomy; VH Vitreous hemorrhage; PRP panretinal laser photocoagulation; IVK intravitreal kenalog; VMT vitreomacular traction; MH Macular hole;  NVD neovascularization of the disc; NVE neovascularization elsewhere; AREDS age related eye disease study; ARMD age related macular degeneration; POAG primary open angle glaucoma; EBMD epithelial/anterior basement membrane dystrophy; ACIOL anterior chamber intraocular lens; IOL intraocular lens; PCIOL posterior chamber intraocular lens; Phaco/IOL phacoemulsification with intraocular lens placement; [place] photorefractive keratectomy;  LASIK laser assisted in situ keratomileusis; HTN hypertension; DM diabetes mellitus; COPD chronic obstructive pulmonary disease

## 2021-04-20 ENCOUNTER — Encounter (INDEPENDENT_AMBULATORY_CARE_PROVIDER_SITE_OTHER): Payer: Self-pay | Admitting: Ophthalmology

## 2021-04-20 ENCOUNTER — Ambulatory Visit (INDEPENDENT_AMBULATORY_CARE_PROVIDER_SITE_OTHER): Payer: PPO | Admitting: Ophthalmology

## 2021-04-20 ENCOUNTER — Other Ambulatory Visit: Payer: Self-pay

## 2021-04-20 DIAGNOSIS — E103593 Type 1 diabetes mellitus with proliferative diabetic retinopathy without macular edema, bilateral: Secondary | ICD-10-CM | POA: Diagnosis not present

## 2021-04-20 DIAGNOSIS — H25812 Combined forms of age-related cataract, left eye: Secondary | ICD-10-CM

## 2021-04-20 DIAGNOSIS — Z961 Presence of intraocular lens: Secondary | ICD-10-CM | POA: Diagnosis not present

## 2021-04-20 DIAGNOSIS — H35033 Hypertensive retinopathy, bilateral: Secondary | ICD-10-CM | POA: Diagnosis not present

## 2021-04-20 DIAGNOSIS — I10 Essential (primary) hypertension: Secondary | ICD-10-CM | POA: Diagnosis not present

## 2021-04-23 ENCOUNTER — Other Ambulatory Visit: Payer: Self-pay | Admitting: Family Medicine

## 2021-04-23 DIAGNOSIS — I10 Essential (primary) hypertension: Secondary | ICD-10-CM

## 2021-04-23 DIAGNOSIS — Z8673 Personal history of transient ischemic attack (TIA), and cerebral infarction without residual deficits: Secondary | ICD-10-CM

## 2021-04-24 ENCOUNTER — Encounter: Payer: Self-pay | Admitting: Internal Medicine

## 2021-04-25 ENCOUNTER — Other Ambulatory Visit: Payer: Self-pay | Admitting: Family Medicine

## 2021-04-25 DIAGNOSIS — E782 Mixed hyperlipidemia: Secondary | ICD-10-CM

## 2021-04-25 NOTE — Telephone Encounter (Signed)
Chart supports rx refill Last ov: 04/12/2021 Last refill: 01/19/2021

## 2021-05-15 DIAGNOSIS — E109 Type 1 diabetes mellitus without complications: Secondary | ICD-10-CM | POA: Diagnosis not present

## 2021-05-24 ENCOUNTER — Other Ambulatory Visit: Payer: Self-pay | Admitting: Family Medicine

## 2021-05-25 ENCOUNTER — Other Ambulatory Visit: Payer: Self-pay | Admitting: Family Medicine

## 2021-05-25 DIAGNOSIS — I10 Essential (primary) hypertension: Secondary | ICD-10-CM

## 2021-05-31 ENCOUNTER — Other Ambulatory Visit: Payer: Self-pay

## 2021-05-31 ENCOUNTER — Ambulatory Visit (INDEPENDENT_AMBULATORY_CARE_PROVIDER_SITE_OTHER): Payer: PPO | Admitting: Family Medicine

## 2021-05-31 ENCOUNTER — Encounter: Payer: Self-pay | Admitting: Family Medicine

## 2021-05-31 VITALS — BP 138/68 | HR 75 | Temp 97.2°F | Ht 67.0 in | Wt 193.6 lb

## 2021-05-31 DIAGNOSIS — Z125 Encounter for screening for malignant neoplasm of prostate: Secondary | ICD-10-CM

## 2021-05-31 DIAGNOSIS — E782 Mixed hyperlipidemia: Secondary | ICD-10-CM | POA: Diagnosis not present

## 2021-05-31 DIAGNOSIS — I10 Essential (primary) hypertension: Secondary | ICD-10-CM | POA: Diagnosis not present

## 2021-05-31 DIAGNOSIS — N1832 Chronic kidney disease, stage 3b: Secondary | ICD-10-CM | POA: Diagnosis not present

## 2021-05-31 DIAGNOSIS — M545 Low back pain, unspecified: Secondary | ICD-10-CM

## 2021-05-31 NOTE — Progress Notes (Addendum)
Established Patient Office Visit  Subjective:  Patient ID: Adrian Neal, male    DOB: 1966-03-07  Age: 56 y.o. MRN: [id number]  CC:  Chief Complaint  Patient presents with   Follow-up    3 month follow up would like to discuss ultrasound and xray.     HPI Adrian Neal presents for follow-up of low back pain, right upper quadrant pain, hypertension, CKD and mixed hyperlipidemia.  Difficult for him to fast.  Back pain is improved.  We discussed that plain films of his lower back.  All in all they look good.  Right upper quadrant pain is resolved.  Discussed the findings of his abdominal ultrasound.  There is evidence for hepatic steatosis and he is doing what he can about that.  Ongoing lightheadedness with dizziness and gait instability since his stroke.  He uses a cane for ambulation.  Still plans on doing mission trips.  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   Sleep apnea    on BiPAP   Stroke (HCC) 2016    Past Surgical History:  Procedure Laterality Date   ANTERIOR APPROACH HEMI HIP ARTHROPLASTY Left 01/01/2019   Procedure: ANTERIOR APPROACH total hip ARTHROPLASTY;  Surgeon: Samson Frederic, MD;  Location: [facility];  Service: Orthopedics;  Laterality: Left;   CATARACT EXTRACTION Right 1994   CATARACT EXTRACTION W/ INTRAOCULAR LENS IMPLANT  1994   EYE SURGERY Right 1991   vitrectomy   EYE SURGERY Right 1992   scar tissue removed from retina   EYE SURGERY Right 1994   Cat Sx    Family History  Problem Relation Age of Onset   Hypertension Mother    Hyperlipidemia Mother    Hyperlipidemia Father    Hypertension Father     Diabetes Father    Kidney disease Father        had a kidney transplant    Stroke Brother    Diabetes Brother    Pulmonary fibrosis Paternal Grandmother    Lung cancer Paternal Grandfather    Diabetes Daughter    Colon cancer Neg Hx    Esophageal cancer Neg Hx    Rectal cancer Neg Hx    Stomach cancer Neg Hx     Social History   Socioeconomic History   Marital status: Married    Spouse name: Not on file   Number of children: 3   Years of education: Not on file   Highest education level: Not on file  Occupational History   Occupation: Disabled  Tobacco Use   Smoking status: Former    Packs/day: 1.00    Years: 6.00    Pack years: 6.00    Types: Cigarettes    Quit date: 04/30/1991    Years since quitting: 30.1   Smokeless tobacco: Never  Vaping Use   Vaping Use: Never used  Substance and Sexual Activity   Alcohol use: No   Drug use: Never   Sexual activity: Not on file  Other Topics Concern   Not on file  Social History  Narrative   Right handed   Lives wife and kids in a two story home   Social Determinants of Health   Financial Resource Strain: Not on file  Food Insecurity: No Food Insecurity   Worried About Programme researcher, broadcasting/film/video in the Last Year: Never true   Ran Out of Food in the Last Year: Never true  Transportation Needs: Not on file  Physical Activity: Insufficiently Active   Days of Exercise per Week: 2 days   Minutes of Exercise per Session: 60 min  Stress: No Stress Concern Present   Feeling of Stress : Not at all  Social Connections: Socially Integrated   Frequency of Communication with Friends and Family: More than three times a week   Frequency of Social Gatherings with Friends and Family: More than three times a week   Attends Religious Services: More than 4 times per year   Active Member of [employer] or Organizations: Yes   Attends Engineer, structural: More than 4 times per year   Marital Status: Married  Catering manager Violence: Not At  Risk   Fear of Current or Ex-Partner: No   Emotionally Abused: No   Physically Abused: No   Sexually Abused: No    Outpatient Medications Prior to Visit  Medication Sig Dispense Refill   alendronate (FOSAMAX) 70 MG tablet TAKE 1 TABLET BY MOUTH EVERY 7 DAYS -  TAKE  WITH  A  FULL  GLASS  OF  WATER  ON  AN  EMPTY  STOMACH 12 tablet 1   atorvastatin (LIPITOR) 40 MG tablet TAKE 1 TABLET BY MOUTH ONCE DAILY AT  6  PM 90 tablet 0   carvedilol (COREG) 25 MG tablet Take 25 mg by mouth 2 (two) times daily.     chlorthalidone (HYGROTON) 25 MG tablet Take 12.5 mg by mouth daily.     clopidogrel (PLAVIX) 75 MG tablet TAKE 1 TABLET BY MOUTH ONCE DAILY **ASPIRIN  AND  PLAVIX  FOR  3  MONTHS  AFTER  THAT  *STOP  ASPIRIN*  TAKE  PLAVIX  ALONE 90 tablet 3   Continuous Blood Gluc Receiver (DEXCOM G6 RECEIVER) DEVI 1 Device by Does not apply route as directed. 1 Device 0   Continuous Blood Gluc Sensor (DEXCOM G6 SENSOR) MISC 3 Devices by Does not apply route as directed. 3 each 6   Continuous Blood Gluc Transmit (DEXCOM G6 TRANSMITTER) MISC Use to monitor blood sugar. Change every 90 days. 1 each 2   glucose blood (ONETOUCH VERIO) test strip 1 each by Other route 3 (three) times daily. Use as instructed 300 each 3   hydrALAZINE (APRESOLINE) 10 MG tablet Take 1 tablet by mouth once daily 30 tablet 0   Insulin Infusion Pump (T:SLIM INSULIN PUMP) DEVI by Does not apply route.     insulin lispro (HUMALOG) 100 UNIT/ML injection Max daily dose of 100 units via pump DX E10.59 (vials) 90 mL 4   lisinopril (ZESTRIL) 40 MG tablet Take 40 mg by mouth daily.     pantoprazole (PROTONIX) 40 MG tablet Take 1 tablet by mouth once daily 90 tablet 0   spironolactone (ALDACTONE) 25 MG tablet Take 0.5 tablets (12.5 mg total) by mouth daily. 15 tablet 11   Calcium Carbonate-Vitamin D 600-400 MG-UNIT tablet Take 1 tablet by mouth 2 (two) times daily.  (Patient not taking: Reported on 04/20/2021)     cloNIDine (CATAPRES) 0.2 MG  tablet Take 1 tablet by mouth twice daily (Patient not  taking: Reported on 04/20/2021) 180 tablet 0   fluticasone (FLONASE) 50 MCG/ACT nasal spray Place 2 sprays into both nostrils daily. (Patient not taking: Reported on 04/20/2021)     methocarbamol (ROBAXIN) 500 MG tablet Take 1 tablet (500 mg total) by mouth every 8 (eight) hours as needed for muscle spasms. (Patient not taking: Reported on 04/20/2021) 40 tablet 0   No facility-administered medications prior to visit.    No Known Allergies  ROS Review of Systems  Constitutional:  Negative for diaphoresis, fatigue, fever and unexpected weight change.  HENT: Negative.    Eyes:  Negative for photophobia and visual disturbance.  Respiratory: Negative.    Cardiovascular: Negative.   Gastrointestinal: Negative.   Endocrine: Negative for polyphagia and polyuria.  Genitourinary: Negative.  Negative for difficulty urinating, frequency and urgency.  Musculoskeletal:  Positive for gait problem.  Neurological:  Positive for dizziness. Negative for speech difficulty and weakness.  Psychiatric/Behavioral: Negative.       Objective:    Physical Exam Vitals and nursing note reviewed.  Constitutional:      General: He is not in acute distress.    Appearance: Normal appearance. He is not ill-appearing, toxic-appearing or diaphoretic.  HENT:     Head: Normocephalic and atraumatic.     Left Ear: External ear normal.     Mouth/Throat:     Mouth: Mucous membranes are moist.     Pharynx: Oropharynx is clear. No oropharyngeal exudate or posterior oropharyngeal erythema.  Eyes:     General: No scleral icterus.       Right eye: No discharge.        Left eye: No discharge.     Extraocular Movements: Extraocular movements intact.     Conjunctiva/sclera: Conjunctivae normal.     Pupils: Pupils are equal, round, and reactive to light.  Cardiovascular:     Rate and Rhythm: Normal rate and regular rhythm.  Pulmonary:     Effort: Pulmonary effort is  normal.     Breath sounds: Normal breath sounds.  Musculoskeletal:     Cervical back: No rigidity or tenderness.  Lymphadenopathy:     Cervical: No cervical adenopathy.  Skin:    General: Skin is warm and dry.  Neurological:     Mental Status: He is alert and oriented to person, place, and time.  Psychiatric:        Mood and Affect: Mood normal.        Behavior: Behavior normal.    BP 138/68 (BP Location: Right Arm, Patient Position: Sitting, Cuff Size: Large)   Pulse 75   Temp (!) 97.2 F (36.2 C) (Temporal)   Ht 5\' 7"  (1.702 m)   Wt 193 lb 9.6 oz (87.8 kg)   SpO2 98%   BMI 30.32 kg/m  Wt Readings from Last 3 Encounters:  05/31/21 193 lb 9.6 oz (87.8 kg)  04/12/21 194 lb 6.4 oz (88.2 kg)  02/27/21 193 lb 6.4 oz (87.7 kg)     Health Maintenance Due  Topic Date Due   Hepatitis C Screening  Never done   Zoster Vaccines- Shingrix (2 of 2) 01/22/2021    There are no preventive care reminders to display for this patient.  Lab Results  Component Value Date   TSH 3.18 01/04/2020   Lab Results  Component Value Date   WBC 5.8 02/27/2021   HGB 12.2 (L) 02/27/2021   HCT 36.9 (L) 02/27/2021   MCV 89.0 02/27/2021   PLT 120.0 (L) 02/27/2021   Lab  Results  Component Value Date   NA 139 02/27/2021   K 4.8 02/27/2021   CO2 26 02/27/2021   GLUCOSE 124 (H) 02/27/2021   BUN 37 (H) 02/27/2021   CREATININE 2.15 (H) 02/27/2021   BILITOT 0.5 02/27/2021   ALKPHOS 90 02/27/2021   AST 18 02/27/2021   ALT 33 02/27/2021   PROT 6.6 02/27/2021   ALBUMIN 4.1 02/27/2021   CALCIUM 8.8 02/27/2021   ANIONGAP 10 04/08/2020   GFR 33.87 (L) 02/27/2021   Lab Results  Component Value Date   CHOL 107 10/28/2018   Lab Results  Component Value Date   HDL 35.10 (L) 10/28/2018   Lab Results  Component Value Date   LDLCALC 60 10/28/2018   Lab Results  Component Value Date   TRIG 58.0 10/28/2018   Lab Results  Component Value Date   CHOLHDL 3 10/28/2018   Lab Results   Component Value Date   HGBA1C 6.8 (A) 01/04/2021      Assessment & Plan:   Problem List Items Addressed This Visit       Cardiovascular and Mediastinum   Essential hypertension   Relevant Orders   Basic metabolic panel   CBC   Urinalysis, Routine w reflex microscopic   Microalbumin / creatinine urine ratio     Genitourinary   Stage 3b chronic kidney disease (HCC) - Primary   Relevant Orders   Basic metabolic panel   Urinalysis, Routine w reflex microscopic   Microalbumin / creatinine urine ratio     Other   Mixed hyperlipidemia   Relevant Orders   LDL cholesterol, direct   Screening for prostate cancer   Relevant Orders   PSA   Midline low back pain without sciatica    No orders of the defined types were placed in this encounter.   Follow-up: Return in about 3 months (around 08/28/2021).  Discussed SGLT2 inhibitors for diabetes and to slow the decline of renal function.  Follow-up as scheduled with nephrology in endocrinology.  Mliss Sax, MD  3/23 addendum: renal recommends dc of aledronate. Do not think that SGLT2 is an option for patient.

## 2021-06-01 LAB — URINALYSIS, ROUTINE W REFLEX MICROSCOPIC
Bilirubin Urine: NEGATIVE
Hgb urine dipstick: NEGATIVE
Ketones, ur: NEGATIVE
Leukocytes,Ua: NEGATIVE
Nitrite: NEGATIVE
RBC / HPF: NONE SEEN (ref 0–?)
Specific Gravity, Urine: 1.015 (ref 1.000–1.030)
Urine Glucose: NEGATIVE
Urobilinogen, UA: 0.2 (ref 0.0–1.0)
pH: 6 (ref 5.0–8.0)

## 2021-06-01 LAB — BASIC METABOLIC PANEL
BUN: 53 mg/dL — ABNORMAL HIGH (ref 6–23)
CO2: 25 mEq/L (ref 19–32)
Calcium: 9.2 mg/dL (ref 8.4–10.5)
Chloride: 107 mEq/L (ref 96–112)
Creatinine, Ser: 2.22 mg/dL — ABNORMAL HIGH (ref 0.40–1.50)
GFR: 32.54 mL/min — ABNORMAL LOW (ref >60.00)
Glucose, Bld: 100 mg/dL — ABNORMAL HIGH (ref 70–99)
Potassium: 4.7 mEq/L (ref 3.5–5.1)
Sodium: 139 mEq/L (ref 135–145)

## 2021-06-01 LAB — CBC
HCT: 40.1 % (ref 39.0–52.0)
Hemoglobin: 13.3 g/dL (ref 13.0–17.0)
MCHC: 33.3 g/dL (ref 30.0–36.0)
MCV: 86.8 fl (ref 78.0–100.0)
Platelets: 121 10*3/uL — ABNORMAL LOW (ref 150.0–400.0)
RBC: 4.62 Mil/uL (ref 4.22–5.81)
RDW: 13.9 % (ref 11.5–15.5)
WBC: 6.5 10*3/uL (ref 4.0–10.5)

## 2021-06-01 LAB — MICROALBUMIN / CREATININE URINE RATIO
Creatinine,U: 54 mg/dL
Microalb Creat Ratio: 26.6 mg/g (ref 0.0–30.0)
Microalb, Ur: 14.4 mg/dL — ABNORMAL HIGH (ref 0.0–1.9)

## 2021-06-01 LAB — LDL CHOLESTEROL, DIRECT: Direct LDL: 62 mg/dL

## 2021-06-01 LAB — PSA: PSA: 0.84 ng/mL (ref 0.10–4.00)

## 2021-06-06 ENCOUNTER — Telehealth: Payer: Self-pay

## 2021-06-06 NOTE — Progress Notes (Signed)
Chronic Care Management Pharmacy Assistant   Name: Adrian Neal  MRN: [id number] DOB: 15-Dec-1965  Reason for Encounter:Diabetes Disease State Call.   Recent office visits:  05/31/2021 Dr. Ethelene Hal MD (PCP) No Medication Changes noted, follow in 3 months 04/12/2021 Dr.Kremer MD (PCP) Increase Lisinopril to 40 mg daily, start methocarbamol PRN  Return in about 2 weeks   Recent consult visits:  04/20/2021 Dr. Coralyn Pear MD (Ophthalmology) No medication Changes noted, follow up in 1 year  Hospital visits:  None in previous 6 months  Medications: Outpatient Encounter Medications as of 06/06/2021  Medication Sig   alendronate (FOSAMAX) 70 MG tablet TAKE 1 TABLET BY MOUTH EVERY 7 DAYS -  TAKE  WITH  A  FULL  GLASS  OF  WATER  ON  AN  EMPTY  STOMACH   atorvastatin (LIPITOR) 40 MG tablet TAKE 1 TABLET BY MOUTH ONCE DAILY AT  6  PM   carvedilol (COREG) 25 MG tablet Take 25 mg by mouth 2 (two) times daily.   chlorthalidone (HYGROTON) 25 MG tablet Take 12.5 mg by mouth daily.   clopidogrel (PLAVIX) 75 MG tablet TAKE 1 TABLET BY MOUTH ONCE DAILY **ASPIRIN  AND  PLAVIX  FOR  3  MONTHS  AFTER  THAT  *STOP  ASPIRIN*  TAKE  PLAVIX  ALONE   Continuous Blood Gluc Receiver (DEXCOM G6 RECEIVER) DEVI 1 Device by Does not apply route as directed.   Continuous Blood Gluc Sensor (DEXCOM G6 SENSOR) MISC 3 Devices by Does not apply route as directed.   Continuous Blood Gluc Transmit (DEXCOM G6 TRANSMITTER) MISC Use to monitor blood sugar. Change every 90 days.   glucose blood (ONETOUCH VERIO) test strip 1 each by Other route 3 (three) times daily. Use as instructed   hydrALAZINE (APRESOLINE) 10 MG tablet Take 1 tablet by mouth once daily   Insulin Infusion Pump (T:SLIM INSULIN PUMP) DEVI by Does not apply route.   insulin lispro (HUMALOG) 100 UNIT/ML injection Max daily dose of 100 units via pump DX E10.59 (vials)   lisinopril (ZESTRIL) 40 MG tablet Take 40 mg by mouth daily.   pantoprazole (PROTONIX) 40 MG  tablet Take 1 tablet by mouth once daily   spironolactone (ALDACTONE) 25 MG tablet Take 0.5 tablets (12.5 mg total) by mouth daily.   No facility-administered encounter medications on file as of 06/06/2021.    Care Gaps: Hepatits C Screening Shingrix Vaccine Star Rating Drugs: Atorvastatin 40 mg last filled on 04/25/2021 90 day supply at [facility]. Lisinopril 40 mg last filled on 04/24/2021 90 day supply at [facility]. Medication Fill Gaps: None ID  Recent Relevant Labs: Lab Results  Component Value Date/Time   HGBA1C 6.8 (A) 01/04/2021 11:06 AM   HGBA1C 6.9 (A) 05/23/2020 09:47 AM   HGBA1C 7.2 (H) 01/01/2019 04:34 AM   HGBA1C 7.7 (H) 01/20/2015 05:17 AM   MICROALBUR 14.4 (H) 05/31/2021 04:18 PM   MICROALBUR 28.4 (H) 10/28/2018 08:39 AM    Kidney Function Lab Results  Component Value Date/Time   CREATININE 2.22 (H) 05/31/2021 04:18 PM   CREATININE 2.15 (H) 02/27/2021 04:00 PM   CREATININE 2.55 (H) 06/09/2020 03:58 PM   CREATININE 2.07 (H) 01/08/2019 04:38 PM   GFR 32.54 (L) 05/31/2021 04:18 PM   GFRNONAA 36 (L) 04/08/2020 02:37 AM   GFRAA 35 (L) 01/03/2019 07:28 AM    Current antihyperglycemic regimen:  Humalog 100 unit/ml injection via pump What recent interventions/DTPs have been made to improve glycemic  control:  None ID  Have there been any recent hospitalizations or ED visits since last visit with CPP? No Patient denies hypoglycemic symptoms, including Pale, Sweaty, Shaky, Hungry, Nervous/irritable, and Vision changes Patient denies hyperglycemic symptoms, including blurry vision, excessive thirst, fatigue, polyuria, and weakness How often are you checking your blood sugar?   Patient states he has a Dexcom that monitors his sugar levels through out the day. What are your blood sugars ranging?  Patient states his Dexcom states his sugar average is 176 while on the phone. During the week, how often does your blood glucose drop below 70? Twice a  week Are you checking your feet daily/regularly?   Patient denies any numbness, pain , or tingling sensations in his feet.  Adherence Review: Is the patient currently on a STATIN medication? Yes Is the patient currently on ACE/ARB medication? Yes Does the patient have >5 day gap between last estimated fill dates? No   Patient agreed to schedule a follow up appointment since it has been a while he has had one.  Telephone follow up appointment with Care management team member was scheduled for : 09/20/2021 at 3:45 pm.   [place] Pharmacist Assistant [phone]

## 2021-06-09 ENCOUNTER — Other Ambulatory Visit: Payer: Self-pay | Admitting: Family

## 2021-06-13 ENCOUNTER — Other Ambulatory Visit: Payer: Self-pay

## 2021-06-13 DIAGNOSIS — K219 Gastro-esophageal reflux disease without esophagitis: Secondary | ICD-10-CM

## 2021-06-13 MED ORDER — PANTOPRAZOLE SODIUM 40 MG PO TBEC: 40.0000 mg | DELAYED_RELEASE_TABLET | Freq: Every day | ORAL | 0 refills | Status: DC

## 2021-06-14 DIAGNOSIS — E109 Type 1 diabetes mellitus without complications: Secondary | ICD-10-CM | POA: Diagnosis not present

## 2021-06-30 ENCOUNTER — Other Ambulatory Visit: Payer: Self-pay | Admitting: Family Medicine

## 2021-06-30 DIAGNOSIS — I10 Essential (primary) hypertension: Secondary | ICD-10-CM

## 2021-07-04 ENCOUNTER — Ambulatory Visit: Payer: PPO | Admitting: Internal Medicine

## 2021-07-04 DIAGNOSIS — N184 Chronic kidney disease, stage 4 (severe): Secondary | ICD-10-CM | POA: Diagnosis not present

## 2021-07-06 ENCOUNTER — Encounter: Payer: Self-pay | Admitting: Internal Medicine

## 2021-07-06 ENCOUNTER — Ambulatory Visit (INDEPENDENT_AMBULATORY_CARE_PROVIDER_SITE_OTHER): Payer: PPO | Admitting: Internal Medicine

## 2021-07-06 ENCOUNTER — Other Ambulatory Visit: Payer: Self-pay

## 2021-07-06 VITALS — BP 124/82 | HR 92 | Ht 67.0 in | Wt 193.8 lb

## 2021-07-06 DIAGNOSIS — N184 Chronic kidney disease, stage 4 (severe): Secondary | ICD-10-CM | POA: Diagnosis not present

## 2021-07-06 DIAGNOSIS — R739 Hyperglycemia, unspecified: Secondary | ICD-10-CM

## 2021-07-06 DIAGNOSIS — E1059 Type 1 diabetes mellitus with other circulatory complications: Secondary | ICD-10-CM

## 2021-07-06 DIAGNOSIS — E10319 Type 1 diabetes mellitus with unspecified diabetic retinopathy without macular edema: Secondary | ICD-10-CM | POA: Diagnosis not present

## 2021-07-06 DIAGNOSIS — E1022 Type 1 diabetes mellitus with diabetic chronic kidney disease: Secondary | ICD-10-CM

## 2021-07-06 LAB — POCT GLYCOSYLATED HEMOGLOBIN (HGB A1C): Hemoglobin A1C: 7.2 % — AB (ref 4.0–5.6)

## 2021-07-06 NOTE — Progress Notes (Signed)
Name: Adrian Neal  Age/ Sex: 56 y.o., male   MRN/ DOB: [id number], 1966-04-23     PCP: [name], MD   Reason for Endocrinology Evaluation: Type 1 Diabetes Mellitus  Initial Endocrine Consultative Visit: 06/18/2018    PATIENT IDENTIFIER: [name] is a 56 y.o. male with a past medical history of .HTN,Hyperlipidemia and Hx of CVA  The patient has followed with Endocrinology clinic since 06/18/2018 for consultative assistance with management of his diabetes.  DIABETIC HISTORY:  [name] was diagnosed with T1DM at age 9, he has been on insulin pump since 2012. His hemoglobin A1c has ranged from 7.2% , peaking at 9.0%   In 11/2018 he switched to the T-Slim   SUBJECTIVE:   During the last visit (01/04/2021): A1c 6.8 %. we adjusted his I:C ratio during the day      Today (07/06/2021): [name] is here for a follow up on diabetes management and his new pump.  He checks his blood sugars multiple times daily, preprandial and bedtime. The patient has had hypoglycemic episodes since the last clinic visit, which typically occur 2 x /week - most often occuring during the day after meals. The patient is symptomatic with these episodes.  Is seeing nephrology with CKD IV  Denies nausea or vomiting or diarrhea    This patient with type 1 diabetes is treated with humalog (insulin pump). The clinical list was updated.     Current pump settings    Pump   T-Slim      Insulin type   HUMALOG    Basal rate       0000 1.0 u/h    0600 1.0   1100 0.9          I:C ratio   0000-0600 1:9   0600-1100  1:6   1100-0000 1:8      Sensitivity      0000 30      AIT      0000  5      Goal       0000  110             Type & Model of Pump:T-Slim  Insulin Type: Currently using Humalog   PUMP STATISTICS:  2/24-07/05/2021 Average BG Dashboard : 122 Average daily Carbs 165 Average Total Daily Insulin:  81.54 u/day  Average Daily Basal: 29(34  %) Average Daily Bolus: 57 (66 %)       Statin: yes ACE-I/ARB: yes      DIABETIC COMPLICATIONS: Microvascular complications:  CKD IV, 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  Denies: Neuropathy  Last eye exam: Completed 02/2021   Macrovascular complications:  CVA Denies: CAD, PVD       HISTORY:  Past Medical History:  Past Medical History:  Diagnosis Date   Cataract    Mixed form OS   Chronic kidney disease    Diabetes mellitus without complication ([place])    diagnosed at age 27   Eye problems    Heart murmur 1996   High cholesterol    patient denies but take preventative medicine   Hypertension    Hypertensive retinopathy    OU   Retinopathy due to secondary diabetes mellitus ([place])    PDR OU   Sleep apnea    on BiPAP   Stroke ([place]) 2016   Past Surgical History:  Past Surgical History:  Procedure Laterality Date   ANTERIOR APPROACH HEMI HIP ARTHROPLASTY Left 01/01/2019  Procedure: ANTERIOR APPROACH total hip ARTHROPLASTY;  Surgeon: Rod Can, MD;  Location: [facility];  Service: Orthopedics;  Laterality: Left;   CATARACT EXTRACTION Right 1994   CATARACT EXTRACTION W/ INTRAOCULAR LENS IMPLANT  1994   EYE SURGERY Right 1991   vitrectomy   EYE SURGERY Right 1992   scar tissue removed from retina   EYE SURGERY Right 1994   Cat Sx   Social History:  reports that he quit smoking about 30 years ago. His smoking use included cigarettes. He has a 6.00 pack-year smoking history. He has never used smokeless tobacco. He reports that he does not drink alcohol and does not use drugs. Family History:  Family History  Problem Relation Age of Onset   Hypertension Mother    Hyperlipidemia Mother    Hyperlipidemia Father    Hypertension Father    Diabetes Father    Kidney disease Father        had a kidney transplant    Stroke Brother    Diabetes Brother    Pulmonary fibrosis Paternal Grandmother    Lung cancer Paternal Grandfather    Diabetes Daughter    Colon cancer Neg Hx     Esophageal cancer Neg Hx    Rectal cancer Neg Hx    Stomach cancer Neg Hx     HOME MEDICATIONS: Allergies as of 07/06/2021   No Known Allergies      Medication List        Accurate as of July 06, 2021  7:50 AM. If you have any questions, ask your nurse or doctor.          alendronate 70 MG tablet Commonly known as: FOSAMAX TAKE 1 TABLET BY MOUTH EVERY 7 DAYS -  TAKE  WITH  A  FULL  GLASS  OF  WATER  ON  AN  EMPTY  STOMACH   atorvastatin 40 MG tablet Commonly known as: LIPITOR TAKE 1 TABLET BY MOUTH ONCE DAILY AT  6  PM   carvedilol 25 MG tablet Commonly known as: COREG Take 25 mg by mouth 2 (two) times daily.   chlorthalidone 25 MG tablet Commonly known as: HYGROTON Take 12.5 mg by mouth daily.   clopidogrel 75 MG tablet Commonly known as: PLAVIX TAKE 1 TABLET BY MOUTH ONCE DAILY **ASPIRIN  AND  PLAVIX  FOR  3  MONTHS  AFTER  THAT  *STOP  ASPIRIN*  TAKE  PLAVIX  ALONE   Dexcom G6 Receiver Devi 1 Device by Does not apply route as directed.   Dexcom G6 Sensor Misc 3 Devices by Does not apply route as directed.   Dexcom G6 Transmitter Misc Use to monitor blood sugar. Change every 90 days.   hydrALAZINE 10 MG tablet Commonly known as: APRESOLINE Take 1 tablet by mouth once daily   insulin lispro 100 UNIT/ML injection Commonly known as: HumaLOG Max daily dose of 100 units via pump DX E10.59 (vials)   lisinopril 40 MG tablet Commonly known as: ZESTRIL Take 40 mg by mouth daily.   OneTouch Verio test strip Generic drug: glucose blood 1 each by Other route 3 (three) times daily. Use as instructed   pantoprazole 40 MG tablet Commonly known as: PROTONIX Take 1 tablet (40 mg total) by mouth daily.   spironolactone 25 MG tablet Commonly known as: ALDACTONE Take 0.5 tablets (12.5 mg total) by mouth daily.   T:slim Insulin Pump Devi by Does not apply route.       PHYSICAL EXAM: VS: BP  124/82 (BP Location: Left Arm, Patient Position: Sitting, Cuff  Size: Small)    Pulse 92    Ht '5\' 7"'$  (1.702 m)    Wt 193 lb 12.8 oz (87.9 kg)    SpO2 98%    BMI 30.35 kg/m    EXAM: General: Pt appears well and is in NAD  Lungs: Clear with good BS bilat with no rales, rhonchi, or wheezes  Heart: Auscultation: RRR   Extremities:  BL LE: no pretibial edema   Mental Status: Judgment, insight: intact Orientation: oriented to time, place, and person Mood and affect: no depression, anxiety, or agitation   DM Foot Exam 01/04/2021  The skin of the feet is without sores or ulcerations, toe nails dystrophic The pedal pulses are 1+ on right and 1+ on left. The sensation is intact  to a screening 5.07, 10 gram monofilament bilaterally      DATA REVIEWED:  Lab Results  Component Value Date   HGBA1C 7.2 (A) 07/06/2021   HGBA1C 6.8 (A) 01/04/2021   HGBA1C 6.9 (A) 05/23/2020    Latest Reference Range & Units 05/31/21 16:18  Sodium 135 - 145 mEq/L 139  Potassium 3.5 - 5.1 mEq/L 4.7  Chloride 96 - 112 mEq/L 107  CO2 19 - 32 mEq/L 25  Glucose 70 - 99 mg/dL 100 (H)  BUN 6 - 23 mg/dL 53 (H)  Creatinine 0.40 - 1.50 mg/dL 2.22 (H)  Calcium 8.4 - 10.5 mg/dL 9.2      ASSESSMENT / PLAN / RECOMMENDATIONS:   1) Type 1 Diabetes Mellitus, Optimally controlled, With CKD IV, retinopathic and  macrovascular complications - Most recent A1c of 7.2 %. Goal A1c < 7.5 %.   - His A1c remains at goal but slightly higher than previously -He has been noted with hypoglycemia overnight, will increase basal insulin at midnight  -Patient was encouraged to continue to bolus with each meal as he has been noted to forget bolusing at times   MEDICATIONS:  Pump   T-Slim      Insulin type   HUMALOG    Basal rate       0000   1.1 u/h    0600 1.0u/h   1100 0.9      I:C ratio   0000-0600 1:9   0600-1100  1:6   1100-0000 1:8      Sensitivity      0000 30      AIT      0000  5      Goal       0000  110      EDUCATION / INSTRUCTIONS: BG monitoring instructions:  Patient is instructed to check his blood sugars 4 times a day, before meals and bedtime. Call [facility] if: BG persistently < 70  I reviewed the Rule of 15 for the treatment of hypoglycemia in detail with the patient. Literature supplied.    F/U in 6 months      Signed electronically by: Mack Guise, MD  [facility]  [facility] [address] [place], [place] [address] Phone: [phone] FAX: [phone]   CC: [name], [place] [address] Phone: [phone]  Fax: [phone]  Return to Endocrinology clinic as below: Future Appointments  Date Time Provider [place]  08/28/2021  3:40 PM [name], MD [facility]  09/18/2021  2:15 PM [facility]-NURSE HEALTH ADVISOR [facility]  09/20/2021  3:45 PM [facility]  [facility] PHARMACIST [facility]  04/19/2022  8:30 AM [name], MD [facility] None

## 2021-07-06 NOTE — Patient Instructions (Signed)
Pump   T-Slim      ?Insulin type   HUMALOG    ?Basal rate      ? 0000   1.1 u/h   ? 0600 1.0u/h  ? 1100 0.9  ?    ?I:C ratio   0000-0600 1:9  ? 0600-1100  1:6  ? 1100-0000 1:8  ?    ?Sensitivity     ? 0000 30  ?  ? ?

## 2021-07-10 DIAGNOSIS — I129 Hypertensive chronic kidney disease with stage 1 through stage 4 chronic kidney disease, or unspecified chronic kidney disease: Secondary | ICD-10-CM | POA: Diagnosis not present

## 2021-07-10 DIAGNOSIS — E1022 Type 1 diabetes mellitus with diabetic chronic kidney disease: Secondary | ICD-10-CM | POA: Diagnosis not present

## 2021-07-10 DIAGNOSIS — N2581 Secondary hyperparathyroidism of renal origin: Secondary | ICD-10-CM | POA: Diagnosis not present

## 2021-07-10 DIAGNOSIS — M810 Age-related osteoporosis without current pathological fracture: Secondary | ICD-10-CM | POA: Diagnosis not present

## 2021-07-10 DIAGNOSIS — N184 Chronic kidney disease, stage 4 (severe): Secondary | ICD-10-CM | POA: Diagnosis not present

## 2021-07-11 DIAGNOSIS — E109 Type 1 diabetes mellitus without complications: Secondary | ICD-10-CM | POA: Diagnosis not present

## 2021-07-23 DIAGNOSIS — E109 Type 1 diabetes mellitus without complications: Secondary | ICD-10-CM | POA: Diagnosis not present

## 2021-07-24 ENCOUNTER — Other Ambulatory Visit: Payer: Self-pay | Admitting: Family Medicine

## 2021-07-24 DIAGNOSIS — E782 Mixed hyperlipidemia: Secondary | ICD-10-CM

## 2021-07-24 NOTE — Telephone Encounter (Signed)
Chart supports rx refill ?Last ov: 05/31/2021 ?Last refill: 04/25/21 ? ?

## 2021-07-27 DIAGNOSIS — N184 Chronic kidney disease, stage 4 (severe): Secondary | ICD-10-CM | POA: Diagnosis not present

## 2021-08-13 ENCOUNTER — Encounter: Payer: Self-pay | Admitting: Family Medicine

## 2021-08-14 ENCOUNTER — Encounter: Payer: Self-pay | Admitting: Internal Medicine

## 2021-08-15 ENCOUNTER — Encounter: Payer: Self-pay | Admitting: Family Medicine

## 2021-08-15 ENCOUNTER — Ambulatory Visit (INDEPENDENT_AMBULATORY_CARE_PROVIDER_SITE_OTHER): Payer: PPO | Admitting: Family Medicine

## 2021-08-15 ENCOUNTER — Other Ambulatory Visit (HOSPITAL_COMMUNITY)
Admission: RE | Admit: 2021-08-15 | Discharge: 2021-08-15 | Disposition: A | Discharge status: Home / Self Care | Payer: PPO | Source: Ambulatory Visit | Attending: Family Medicine | Admitting: Family Medicine

## 2021-08-15 VITALS — BP 138/68 | HR 74 | Temp 98.0°F | Ht 67.0 in | Wt 194.0 lb

## 2021-08-15 DIAGNOSIS — L918 Other hypertrophic disorders of the skin: Secondary | ICD-10-CM | POA: Diagnosis not present

## 2021-08-15 NOTE — Progress Notes (Signed)
? ?Established Patient Office Visit ? ?Subjective   ?Patient ID: Adrian Neal, male    DOB: 1965-05-15  Age: 56 y.o. MRN: [id number] ? ?Chief Complaint  ?Patient presents with  ? Nevus  ?  Mole at left armpit becoming painful x 2-4 weeks. No change in size.   ? ? ?HPI evaluation and treatment of a mole that is becoming tender weeks.  History of acrochordon.  There has been no drainage erythema or streaking.  Denies fevers chills. ? ? ? ?Review of Systems  ?Constitutional: Negative.   ?Respiratory: Negative.    ?Cardiovascular: Negative.   ?Skin:  Negative for itching and rash.  ?Neurological:  Positive for dizziness.  ? ?  ?Objective:  ?  ? ?BP 138/68 (BP Location: Right Arm, Patient Position: Sitting, Cuff Size: Large)   Pulse 74   Temp 98 ?F (36.7 ?C) (Temporal)   Ht '5\' 7"'$  (1.702 m)   Wt 194 lb (88 kg)   SpO2 98%   BMI 30.38 kg/m?  ? ? ?Physical Exam ?Vitals and nursing note reviewed.  ?Constitutional:   ?   General: He is not in acute distress. ?   Appearance: Normal appearance. He is not ill-appearing, toxic-appearing or diaphoretic.  ?Eyes:  ?   General: No scleral icterus.    ?   Right eye: No discharge.     ?   Left eye: No discharge.  ?   Extraocular Movements: Extraocular movements intact.  ?   Conjunctiva/sclera: Conjunctivae normal.  ?Pulmonary:  ?   Effort: Pulmonary effort is normal.  ?Skin: ? ?    ?Neurological:  ?   Mental Status: He is oriented to person, place, and time.  ?Psychiatric:     ?   Mood and Affect: Mood normal.     ?   Behavior: Behavior normal.  ? ?Shave Biopsy Procedure Note ? ?Pre-operative Diagnosis: necrotic acrochordon post-operative Diagnosis: normal ? ?Locations:left  axilla ? ?Indications: Tender ? ?Anesthesia: Lidocaine 1% with epinephrine without added sodium bicarbonate ? ?Procedure Details  ?History of allergy to iodine: no ? ?Patient informed of the risks (including bleeding and infection) and benefits of the  ?procedure and Verbal informed consent  obtained. ? ?The lesion and surrounding area were given a sterile prep using betadyne and draped in the usual sterile fashion. A scalpel was used to shave an area of skin approximately 1cm by 1cm.  Hemostasis achieved with direct pressure and silver nitrate. Antibiotic ointment and a sterile dressing applied.  The specimen was sent for pathologic examination. The patient tolerated the procedure well. ? ?EBL: 0 ml ? ?Findings: ?same ? ?Condition: ?Stable ? ?Complications: ?none. ? ?Plan: ?1. Instructed to keep the wound dry and covered for 24-48h and clean thereafter. ?2. Warning signs of infection were reviewed.   ?3. Recommended that the patient use  tylenol  as needed for pain.  ?4. Return in 5 days. ? ?No results found for any visits on 08/15/21. ? ? ? ?The ASCVD Risk score (Arnett DK, et al., 2019) failed to calculate for the following reasons: ?  The patient has a prior MI or stroke diagnosis ? ?  ?Assessment & Plan:  ? ?Problem List Items Addressed This Visit   ? ?  ? Musculoskeletal and Integument  ? Achrochordon - Primary  ? Relevant Orders  ? Surgical pathology( [place]/ [facility])  ? ? ?Return in about 5 days (around 08/20/2021).  ? ?Wound care discussed with daily dressings and antibiotic ointment.  Information  was given on acrochordon and after shave biopsy care. ?[name], MD ? ?

## 2021-08-17 ENCOUNTER — Telehealth: Payer: Self-pay

## 2021-08-17 LAB — SURGICAL PATHOLOGY

## 2021-08-17 NOTE — Progress Notes (Signed)
Per Clinical pharmacist, please reschedule patient appointment on 09/20/2021 to either another day or different time. ? ?Telephone follow up appointment with Care management team member rescheduled for : 09/20/2021 at 9:30 am. ? ?Anderson Malta ?Clinical Pharmacist Assistant ?[phone]  ? ? ?

## 2021-08-23 DIAGNOSIS — E109 Type 1 diabetes mellitus without complications: Secondary | ICD-10-CM | POA: Diagnosis not present

## 2021-08-28 ENCOUNTER — Encounter: Payer: Self-pay | Admitting: Family Medicine

## 2021-08-28 ENCOUNTER — Ambulatory Visit (INDEPENDENT_AMBULATORY_CARE_PROVIDER_SITE_OTHER): Payer: PPO | Admitting: Family Medicine

## 2021-08-28 VITALS — BP 124/66 | HR 80 | Temp 98.2°F | Ht 67.0 in | Wt 195.0 lb

## 2021-08-28 DIAGNOSIS — E1040 Type 1 diabetes mellitus with diabetic neuropathy, unspecified: Secondary | ICD-10-CM | POA: Diagnosis not present

## 2021-08-28 DIAGNOSIS — I10 Essential (primary) hypertension: Secondary | ICD-10-CM

## 2021-08-28 DIAGNOSIS — N1832 Chronic kidney disease, stage 3b: Secondary | ICD-10-CM | POA: Diagnosis not present

## 2021-08-28 NOTE — Progress Notes (Signed)
? ?Established Patient Office Visit ? ?Subjective   ?Patient ID: Adrian Neal, male    DOB: 1965/12/22  Age: 56 y.o. MRN: [id number] ? ?Chief Complaint  ?Patient presents with  ? Follow-up  ?  3 month follow up, states that toes on right foot will sometimes go numb when standing for 30 minutes or longer.   ? ? ?HPI doing well.  No recent follow-up with nephrology.  Believes that appointments are pending.  Recent hemoglobin A1c is 7.2.  Has noted paresthesias at the tip of his right second toe after standing for..  Denies needlelike paresthesias.  No discoloration or rashes noted.  Ongoing dizziness after his stroke.  Continues to use a cane for ambulation.  Status post physical therapy for this issue. ? ? ? ?He goes to live 8 he goes to the lab ? ? ? ?Review of Systems  ?Constitutional: Negative.   ?HENT: Negative.    ?Eyes:  Negative for blurred vision, discharge and redness.  ?Respiratory: Negative.    ?Cardiovascular: Negative.   ?Gastrointestinal:  Negative for abdominal pain.  ?Genitourinary: Negative.   ?Musculoskeletal: Negative.  Negative for myalgias.  ?Skin:  Negative for rash.  ?Neurological:  Positive for dizziness and tingling. Negative for loss of consciousness and weakness.  ?Endo/Heme/Allergies:  Negative for polydipsia.  ? ?  ?Objective:  ?  ? ?BP 124/66 (BP Location: Right Arm, Patient Position: Sitting, Cuff Size: Large)   Pulse 80   Temp 98.2 ?F (36.8 ?C) (Temporal)   Ht '5\' 7"'$  (1.702 m)   Wt 195 lb (88.5 kg)   SpO2 97%   BMI 30.54 kg/m?  ? ? ?Physical Exam ?Constitutional:   ?   General: He is not in acute distress. ?   Appearance: Normal appearance. He is not ill-appearing, toxic-appearing or diaphoretic.  ?HENT:  ?   Head: Normocephalic and atraumatic.  ?   Right Ear: External ear normal.  ?   Left Ear: External ear normal.  ?Eyes:  ?   General: No scleral icterus.    ?   Right eye: No discharge.     ?   Left eye: No discharge.  ?   Extraocular Movements: Extraocular movements intact.   ?   Conjunctiva/sclera: Conjunctivae normal.  ?Cardiovascular:  ?   Rate and Rhythm: Normal rate and regular rhythm.  ?   Pulses:     ?     Dorsalis pedis pulses are 1+ on the right side and 1+ on the left side.  ?     Posterior tibial pulses are 1+ on the right side and 1+ on the left side.  ?Pulmonary:  ?   Effort: Pulmonary effort is normal. No respiratory distress.  ?   Breath sounds: Normal breath sounds.  ?Abdominal:  ?   General: Bowel sounds are normal.  ?Musculoskeletal:  ?   Cervical back: No rigidity or tenderness.  ?Skin: ?   General: Skin is warm and dry.  ?Neurological:  ?   Mental Status: He is alert and oriented to person, place, and time.  ?Psychiatric:     ?   Mood and Affect: Mood normal.     ?   Behavior: Behavior normal.  ? ? ?Diabetic Foot Exam - Simple   ?Simple Foot Form ?Diabetic Foot exam was performed with the following findings: Yes 08/28/2021  4:09 PM  ?Visual Inspection ?See comments: Yes ?Sensation Testing ?Intact to touch and monofilament testing bilaterally: Yes ?Pulse Check ?Posterior Tibialis and Dorsalis pulse  intact bilaterally: Yes ?Comments ?Feet are standard without lesions or rashes.  All toenails with mycotic changes.  Capillary refill is less than 3 seconds. ?  ?  ?No results found for any visits on 08/28/21. ? ? ? ?The ASCVD Risk score (Arnett DK, et al., 2019) failed to calculate for the following reasons: ?  The patient has a prior MI or stroke diagnosis ? ?  ?Assessment & Plan:  ? ?Problem List Items Addressed This Visit   ? ?  ? Cardiovascular and Mediastinum  ? Essential hypertension  ? Relevant Orders  ? Basic metabolic panel  ?  ? Endocrine  ? Type 1 diabetes mellitus with diabetic neuropathy, unspecified ([place])  ? Relevant Orders  ? VAS [place] ABI WITH/WO TBI  ?  ? Genitourinary  ? Stage 3b chronic kidney disease ([place]) - Primary  ? Relevant Orders  ? Basic metabolic panel  ? ? ?Return in about 6 months (around 02/28/2022), or if symptoms worsen or fail to improve.   ? ? ?Adrian Maw, MD ? ?

## 2021-08-29 LAB — BASIC METABOLIC PANEL
BUN: 55 mg/dL — ABNORMAL HIGH (ref 6–23)
CO2: 20 mEq/L (ref 19–32)
Calcium: 8.6 mg/dL (ref 8.4–10.5)
Chloride: 108 mEq/L (ref 96–112)
Creatinine, Ser: 2.55 mg/dL — ABNORMAL HIGH (ref 0.40–1.50)
GFR: 27.5 mL/min — ABNORMAL LOW (ref >60.00)
Glucose, Bld: 201 mg/dL — ABNORMAL HIGH (ref 70–99)
Potassium: 4.6 mEq/L (ref 3.5–5.1)
Sodium: 137 mEq/L (ref 135–145)

## 2021-09-14 ENCOUNTER — Ambulatory Visit (HOSPITAL_COMMUNITY)
Admission: RE | Admit: 2021-09-14 | Discharge: 2021-09-14 | Disposition: A | Discharge status: Home / Self Care | Payer: PPO | Source: Ambulatory Visit | Attending: Family Medicine | Admitting: Family Medicine

## 2021-09-14 ENCOUNTER — Encounter (HOSPITAL_COMMUNITY): Payer: PPO

## 2021-09-14 DIAGNOSIS — E1040 Type 1 diabetes mellitus with diabetic neuropathy, unspecified: Secondary | ICD-10-CM | POA: Diagnosis not present

## 2021-09-14 NOTE — Progress Notes (Signed)
ABI completed. Refer to "CV Proc" under chart review to view preliminary results.  09/14/2021 4:22 PM Kelby Aline., MHA, RVT, RDCS, RDMS

## 2021-09-15 ENCOUNTER — Other Ambulatory Visit: Payer: Self-pay | Admitting: Family Medicine

## 2021-09-15 DIAGNOSIS — K219 Gastro-esophageal reflux disease without esophagitis: Secondary | ICD-10-CM

## 2021-09-18 ENCOUNTER — Ambulatory Visit: Payer: PPO

## 2021-09-19 ENCOUNTER — Telehealth: Payer: Self-pay

## 2021-09-19 ENCOUNTER — Ambulatory Visit (INDEPENDENT_AMBULATORY_CARE_PROVIDER_SITE_OTHER): Payer: PPO

## 2021-09-19 DIAGNOSIS — Z0000 Encounter for general adult medical examination without abnormal findings: Secondary | ICD-10-CM | POA: Diagnosis not present

## 2021-09-19 NOTE — Progress Notes (Signed)
Subjective:   Adrian Neal is a 56 y.o. male who presents for an subsequent Medicare Annual Wellness Visit.   I connected with Savvas Roper today by telephone and verified that I am speaking with the correct person using two identifiers. Location patient: home Location provider: work Persons participating in the virtual visit: patient, provider.   I discussed the limitations, risks, security and privacy concerns of performing an evaluation and management service by telephone and the availability of in person appointments. I also discussed with the patient that there may be a patient responsible charge related to this service. The patient expressed understanding and verbally consented to this telephonic visit.    Interactive audio and video telecommunications were attempted between this provider and patient, however failed, due to patient having technical difficulties OR patient did not have access to video capability.  We continued and completed visit with audio only.    Review of Systems     Cardiac Risk Factors include: advanced age (>41mn, >>13women);diabetes mellitus;male gender     Objective:    Today's Vitals   There is no height or weight on file to calculate BMI.     09/19/2021   11:09 AM 09/12/2020   12:52 PM 04/08/2020    2:28 AM 11/25/2019    9:31 AM 09/14/2019    7:41 AM 06/02/2019    2:25 PM 01/01/2019    2:37 PM  Advanced Directives  Does Patient Have a Medical Advance Directive? No No No No No No No  Would patient like information on creating a medical advance directive? No - Patient declined Yes (MAU/Ambulatory/Procedural Areas - Information given)  Yes (MAU/Ambulatory/Procedural Areas - Information given)  No - Patient declined No - Patient declined    Current Medications (verified) Outpatient Encounter Medications as of 09/19/2021  Medication Sig   atorvastatin (LIPITOR) 40 MG tablet TAKE 1 TABLET BY MOUTH ONCE DAILY 6 IN THE EVENING   carvedilol  (COREG) 25 MG tablet Take 25 mg by mouth 2 (two) times daily.   chlorthalidone (HYGROTON) 25 MG tablet Take 12.5 mg by mouth daily.   clopidogrel (PLAVIX) 75 MG tablet TAKE 1 TABLET BY MOUTH ONCE DAILY **ASPIRIN  AND  PLAVIX  FOR  3  MONTHS  AFTER  THAT  *STOP  ASPIRIN*  TAKE  PLAVIX  ALONE   Continuous Blood Gluc Receiver (DEXCOM G6 RECEIVER) DEVI 1 Device by Does not apply route as directed.   Continuous Blood Gluc Sensor (DEXCOM G6 SENSOR) MISC 3 Devices by Does not apply route as directed.   Continuous Blood Gluc Transmit (DEXCOM G6 TRANSMITTER) MISC Use to monitor blood sugar. Change every 90 days.   glucose blood (ONETOUCH VERIO) test strip 1 each by Other route 3 (three) times daily. Use as instructed   Insulin Infusion Pump (T:SLIM INSULIN PUMP) DEVI by Does not apply route.   insulin lispro (HUMALOG) 100 UNIT/ML injection Max daily dose of 100 units via pump DX E10.59 (vials)   lisinopril (ZESTRIL) 40 MG tablet Take 40 mg by mouth daily.   pantoprazole (PROTONIX) 40 MG tablet Take 1 tablet by mouth once daily   spironolactone (ALDACTONE) 25 MG tablet Take 0.5 tablets (12.5 mg total) by mouth daily.   No facility-administered encounter medications on file as of 09/19/2021.    Allergies (verified) Patient has no known allergies.   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 OU   Sleep apnea    on BiPAP   Stroke ([place]) 2016   Past Surgical History:  Procedure Laterality Date   ANTERIOR APPROACH HEMI HIP ARTHROPLASTY Left 01/01/2019   Procedure: ANTERIOR APPROACH total hip ARTHROPLASTY;  Surgeon: Rod Can, MD;  Location: [facility];   Service: Orthopedics;  Laterality: Left;   CATARACT EXTRACTION Right 1994   CATARACT EXTRACTION W/ INTRAOCULAR LENS IMPLANT  1994   EYE SURGERY Right 1991   vitrectomy   EYE SURGERY Right 1992   scar tissue removed from retina   EYE SURGERY Right 1994   Cat Sx   Family History  Problem Relation Age of Onset   Hypertension Mother    Hyperlipidemia Mother    Hyperlipidemia Father    Hypertension Father    Diabetes Father    Kidney disease Father        had a kidney transplant    Stroke Brother    Diabetes Brother    Pulmonary fibrosis Paternal Grandmother    Lung cancer Paternal Grandfather    Diabetes Daughter    Colon cancer Neg Hx    Esophageal cancer Neg Hx    Rectal cancer Neg Hx    Stomach cancer Neg Hx    Social History   Socioeconomic History   Marital status: Married    Spouse name: Not on file   Number of children: 3   Years of education: Not on file   Highest education level: Not on file  Occupational History   Occupation: Disabled  Tobacco Use   Smoking status: Former    Packs/day: 1.00    Years: 6.00    Pack years: 6.00    Types: Cigarettes    Quit date: 04/30/1991    Years since quitting: 30.4   Smokeless tobacco: Never  Vaping Use   Vaping Use: Never used  Substance and Sexual Activity   Alcohol use: No   Drug use: Never   Sexual activity: Not on file  Other Topics Concern   Not on file  Social History Narrative   Right handed   Lives wife and kids in a two story home   Social Determinants of Health   Financial Resource Strain: Low Risk    Difficulty of Paying Living Expenses: Not hard at all  Food Insecurity: No Food Insecurity   Worried About Charity fundraiser in the Last Year: Never true   [place] in the Last Year: Never true  Transportation Needs: No Transportation Needs   Lack of Transportation (Medical): No   Lack of Transportation (Non-Medical): No  Physical Activity: Insufficiently Active   Days of Exercise per Week:  2 days   Minutes of Exercise per Session: 30 min  Stress: No Stress Concern Present   Feeling of Stress : Not at all  Social Connections: Socially Integrated   Frequency of Communication with Friends and Family: Three times a week   Frequency of Social Gatherings with Friends and Family: Three times a week   Attends Religious Services: More than 4 times per year   Active Member of Clubs or Organizations: Yes  Attends Music therapist: More than 4 times per year   Marital Status: Married    Tobacco Counseling Counseling given: Not Answered   Clinical Intake:  Pre-visit preparation completed: Yes  Pain : No/denies pain     Nutritional Risks: None Diabetes: Yes CBG done?: No Did pt. bring in CBG monitor from home?: No  How often do you need to have someone help you when you read instructions, pamphlets, or other written materials from your doctor or pharmacy?: 1 - Never What is the last grade level you completed in school?: college  Diabetic?yes Nutrition Risk Assessment:  Has the patient had any N/V/D within the last 2 months?  No  Does the patient have any non-healing wounds?  No  Has the patient had any unintentional weight loss or weight gain?  No   Diabetes:  Is the patient diabetic?  Yes  If diabetic, was a CBG obtained today?  No  Did the patient bring in their glucometer from home?  No  How often do you monitor your CBG's? Dexcom .   Financial Strains and Diabetes Management:  Are you having any financial strains with the device, your supplies or your medication? No .  Does the patient want to be seen by Chronic Care Management for management of their diabetes?  No  Would the patient like to be referred to a Nutritionist or for Diabetic Management?  No   Diabetic Exams:  Diabetic Eye Exam: Completed 01/2021 Diabetic Foot Exam: Overdue, Pt has been advised about the importance in completing this exam. Pt is scheduled for diabetic foot exam on  next office visit .    Interpreter Needed?: No  Information entered by :: [email or web address],KCL   Activities of Daily Living    09/19/2021   11:12 AM  In your present state of health, do you have any difficulty performing the following activities:  Hearing? 0  Vision? 0  Difficulty concentrating or making decisions? 0  Walking or climbing stairs? 0  Dressing or bathing? 0  Doing errands, shopping? 0  Preparing Food and eating ? N  Using the Toilet? N  In the past six months, have you accidently leaked urine? N  Do you have problems with loss of bowel control? N  Managing your Medications? N  Managing your Finances? N  Housekeeping or managing your Housekeeping? N    Patient Care Team: [name], MD as PCP - General (Family Medicine) Germaine Pomfret, [facility] as Pharmacist (Pharmacist)  Indicate any recent Medical Services you may have received from other than [facility] providers in the past year (date may be approximate).     Assessment:   This is a routine wellness examination for [place].  Hearing/Vision screen Vision Screening - Comments:: Annual eye exams wear glasses   Dietary issues and exercise activities discussed: Current Exercise Habits: Home exercise routine, Type of exercise: walking, Time (Minutes): 40, Frequency (Times/Week): 3, Weekly Exercise (Minutes/Week): 120, Intensity: Mild, Exercise limited by: orthopedic condition(s)   Goals Addressed   None    Depression Screen    09/19/2021   11:12 AM 09/19/2021   11:10 AM 09/19/2021   11:08 AM 08/28/2021    3:37 PM 08/15/2021   10:50 AM 05/31/2021    3:27 PM 02/27/2021    3:22 PM  PHQ 2/9 Scores  PHQ - 2 Score 0 0 0 0 0 0 0    Fall Risk    09/19/2021   11:10 AM 08/28/2021    3:37  PM 08/15/2021   10:50 AM 05/31/2021    3:27 PM 02/27/2021    3:23 PM  Fall Risk   Falls in the past year? 0 0 0 0 0  Number falls in past yr: 0 0 0 0 0  Injury with Fall? 0      Follow up Falls evaluation completed        FALL  RISK PREVENTION PERTAINING TO THE HOME:  Any stairs in or around the home? Yes  If so, are there any without handrails? No  Home free of loose throw rugs in walkways, pet beds, electrical cords, etc? Yes  Adequate lighting in your home to reduce risk of falls? Yes   ASSISTIVE DEVICES UTILIZED TO PREVENT FALLS:  Life alert? No  Use of a cane, walker or w/c? Yes  Grab bars in the bathroom? Yes  Shower chair or bench in shower? No  Elevated toilet seat or a handicapped toilet? No     Cognitive Function:    Normal cognitive status assessed by telephone conversation  by this Nurse Health Advisor. No abnormalities found.      Immunizations Immunization History  Administered Date(s) Administered   Influenza,inj,Quad PF,6+ Mos 07/15/2018, 01/08/2019, 02/27/2021   Influenza-Unspecified 05/29/2020   PFIZER Comirnaty([place])Covid-19 Tri-Sucrose Vaccine 07/09/2019, 07/29/2019, 05/29/2020   Pneumococcal Polysaccharide-23 03/02/2019   Pneumococcal-Unspecified 11/29/2020   Tdap 04/29/2017   Zoster Recombinat (Shingrix) 11/27/2020    TDAP status: Up to date  Flu Vaccine status: Up to date  Pneumococcal vaccine status: Up to date  Covid-19 vaccine status: Completed vaccines  Qualifies for Shingles Vaccine? Yes   Zostavax completed Yes   Shingrix Completed?: Yes  Screening Tests Health Maintenance  Topic Date Due   Hepatitis C Screening  Never done   Zoster Vaccines- Shingrix (2 of 2) 12/26/2021 (Originally 01/22/2021)   INFLUENZA VACCINE  11/27/2021   HEMOGLOBIN A1C  01/06/2022   OPHTHALMOLOGY EXAM  02/13/2022   FOOT EXAM  08/29/2022   COLONOSCOPY (Pts 45-74yr Insurance coverage will need to be confirmed)  07/01/2025   TETANUS/TDAP  04/30/2027   HIV Screening  Completed   HPV VACCINES  Aged Out   COVID-19 Vaccine  Discontinued    Health Maintenance  Health Maintenance Due  Topic Date Due   Hepatitis C Screening  Never done    Colorectal cancer screening: Type of  screening: Colonoscopy. Completed 07/02/2018. Repeat every 7 years  Lung Cancer Screening: (Low Dose CT Chest recommended if Age 56-80years, 30 pack-year currently smoking OR have quit w/in 15years.) does not qualify.   Lung Cancer Screening Referral: n/a  Additional Screening:  Hepatitis C Screening: does not qualify;   Vision Screening: Recommended annual ophthalmology exams for early detection of glaucoma and other disorders of the eye. Is the patient up to date with their annual eye exam?  Yes  Who is the provider or what is the name of the office in which the patient attends annual eye exams? [facility] If pt is not established with a provider, would they like to be referred to a provider to establish care? No .   Dental Screening: Recommended annual dental exams for proper oral hygiene  Community Resource Referral / Chronic Care Management: CRR required this visit?  No   CCM required this visit?  No      Plan:     I have personally reviewed and noted the following in the patient's chart:   Medical and social history Use of alcohol, tobacco or  illicit drugs  Current medications and supplements including opioid prescriptions. Patient is not currently taking opioid prescriptions. Functional ability and status Nutritional status Physical activity Advanced directives List of other physicians Hospitalizations, surgeries, and ER visits in previous 12 months Vitals Screenings to include cognitive, depression, and falls Referrals and appointments  In addition, I have reviewed and discussed with patient certain preventive protocols, quality metrics, and best practice recommendations. A written personalized care plan for preventive services as well as general preventive health recommendations were provided to patient.     Randel Pigg, LPN   9/31/1216   Nurse Notes: none

## 2021-09-19 NOTE — Progress Notes (Signed)
Chronic Care Management APPOINTMENT REMINDER  Adrian Neal was reminded to have all medications, supplements and any blood glucose and blood pressure readings available for review with Adrian Neal, Pharm. D, at his telephone visit on 09/20/2021 at 9:30 am.  Patient Confirm appointment.   [place] Pharmacist Assistant [phone]

## 2021-09-19 NOTE — Patient Instructions (Signed)
[name] , Thank you for taking time to come for your Medicare Wellness Visit. I appreciate your ongoing commitment to your health goals. Please review the following plan we discussed and let me know if I can assist you in the future.   Screening recommendations/referrals: Colonoscopy: 07/02/2018 Recommended yearly ophthalmology/optometry visit for glaucoma screening and checkup Recommended yearly dental visit for hygiene and checkup  Vaccinations: Influenza vaccine: completed  Pneumococcal vaccine: completed  Tdap vaccine: 04/29/2017 Shingles vaccine: completed     Advanced directives: yes   Conditions/risks identified: none   Next appointment: none   Preventive Care 49 Years and Older, Male Preventive care refers to lifestyle choices and visits with your health care provider that can promote health and wellness. What does preventive care include? A yearly physical exam. This is also called an annual well check. Dental exams once or twice a year. Routine eye exams. Ask your health care provider how often you should have your eyes checked. Personal lifestyle choices, including: Daily care of your teeth and gums. Regular physical activity. Eating a healthy diet. Avoiding tobacco and drug use. Limiting alcohol use. Practicing safe sex. Taking low doses of aspirin every day. Taking vitamin and mineral supplements as recommended by your health care provider. What happens during an annual well check? The services and screenings done by your health care provider during your annual well check will depend on your age, overall health, lifestyle risk factors, and family history of disease. Counseling  Your health care provider may ask you questions about your: Alcohol use. Tobacco use. Drug use. Emotional well-being. Home and relationship well-being. Sexual activity. Eating habits. History of falls. Memory and ability to understand (cognition). Work and work  Statistician. Screening  You may have the following tests or measurements: Height, weight, and BMI. Blood pressure. Lipid and cholesterol levels. These may be checked every 5 years, or more frequently if you are over 60 years old. Skin check. Lung cancer screening. You may have this screening every year starting at age 35 if you have a 30-pack-year history of smoking and currently smoke or have quit within the past 15 years. Fecal occult blood test (FOBT) of the stool. You may have this test every year starting at age 33. Flexible sigmoidoscopy or colonoscopy. You may have a sigmoidoscopy every 5 years or a colonoscopy every 10 years starting at age 24. Prostate cancer screening. Recommendations will vary depending on your family history and other risks. Hepatitis C blood test. Hepatitis B blood test. Sexually transmitted disease (STD) testing. Diabetes screening. This is done by checking your blood sugar (glucose) after you have not eaten for a while (fasting). You may have this done every 1-3 years. Abdominal aortic aneurysm (AAA) screening. You may need this if you are a current or former smoker. Osteoporosis. You may be screened starting at age 90 if you are at high risk. Talk with your health care provider about your test results, treatment options, and if necessary, the need for more tests. Vaccines  Your health care provider may recommend certain vaccines, such as: Influenza vaccine. This is recommended every year. Tetanus, diphtheria, and acellular pertussis (Tdap, Td) vaccine. You may need a Td booster every 10 years. Zoster vaccine. You may need this after age 69. Pneumococcal 13-valent conjugate (PCV13) vaccine. One dose is recommended after age 56. Pneumococcal polysaccharide (PPSV23) vaccine. One dose is recommended after age 21. Talk to your health care provider about which screenings and vaccines you need and how often you need  them. This information is not intended to replace  advice given to you by your health care provider. Make sure you discuss any questions you have with your health care provider. Document Released: 05/12/2015 Document Revised: 01/03/2016 Document Reviewed: 02/14/2015 Elsevier Interactive Patient Education  2017 [place] Prevention in the Home Falls can cause injuries. They can happen to people of all ages. There are many things you can do to make your home safe and to help prevent falls. What can I do on the outside of my home? Regularly fix the edges of walkways and driveways and fix any cracks. Remove anything that might make you trip as you walk through a door, such as a raised step or threshold. Trim any bushes or trees on the path to your home. Use bright outdoor lighting. Clear any walking paths of anything that might make someone trip, such as rocks or tools. Regularly check to see if handrails are loose or broken. Make sure that both sides of any steps have handrails. Any raised decks and porches should have guardrails on the edges. Have any leaves, snow, or ice cleared regularly. Use sand or salt on walking paths during winter. Clean up any spills in your garage right away. This includes oil or grease spills. What can I do in the bathroom? Use night lights. Install grab bars by the toilet and in the tub and shower. Do not use towel bars as grab bars. Use non-skid mats or decals in the tub or shower. If you need to sit down in the shower, use a plastic, non-slip stool. Keep the floor dry. Clean up any water that spills on the floor as soon as it happens. Remove soap buildup in the tub or shower regularly. Attach bath mats securely with double-sided non-slip rug tape. Do not have throw rugs and other things on the floor that can make you trip. What can I do in the bedroom? Use night lights. Make sure that you have a light by your bed that is easy to reach. Do not use any sheets or blankets that are too big for your bed.  They should not hang down onto the floor. Have a firm chair that has side arms. You can use this for support while you get dressed. Do not have throw rugs and other things on the floor that can make you trip. What can I do in the kitchen? Clean up any spills right away. Avoid walking on wet floors. Keep items that you use a lot in easy-to-reach places. If you need to reach something above you, use a strong step stool that has a grab bar. Keep electrical cords out of the way. Do not use floor polish or wax that makes floors slippery. If you must use wax, use non-skid floor wax. Do not have throw rugs and other things on the floor that can make you trip. What can I do with my stairs? Do not leave any items on the stairs. Make sure that there are handrails on both sides of the stairs and use them. Fix handrails that are broken or loose. Make sure that handrails are as long as the stairways. Check any carpeting to make sure that it is firmly attached to the stairs. Fix any carpet that is loose or worn. Avoid having throw rugs at the top or bottom of the stairs. If you do have throw rugs, attach them to the floor with carpet tape. Make sure that you have a light switch at  the top of the stairs and the bottom of the stairs. If you do not have them, ask someone to add them for you. What else can I do to help prevent falls? Wear shoes that: Do not have high heels. Have rubber bottoms. Are comfortable and fit you well. Are closed at the toe. Do not wear sandals. If you use a stepladder: Make sure that it is fully opened. Do not climb a closed stepladder. Make sure that both sides of the stepladder are locked into place. Ask someone to hold it for you, if possible. Clearly mark and make sure that you can see: Any grab bars or handrails. First and last steps. Where the edge of each step is. Use tools that help you move around (mobility aids) if they are needed. These  include: Canes. Walkers. Scooters. Crutches. Turn on the lights when you go into a dark area. Replace any light bulbs as soon as they burn out. Set up your furniture so you have a clear path. Avoid moving your furniture around. If any of your floors are uneven, fix them. If there are any pets around you, be aware of where they are. Review your medicines with your doctor. Some medicines can make you feel dizzy. This can increase your chance of falling. Ask your doctor what other things that you can do to help prevent falls. This information is not intended to replace advice given to you by your health care provider. Make sure you discuss any questions you have with your health care provider. Document Released: 02/09/2009 Document Revised: 09/21/2015 Document Reviewed: 05/20/2014 Elsevier Interactive Patient Education  2017 [employer].

## 2021-09-20 ENCOUNTER — Ambulatory Visit (INDEPENDENT_AMBULATORY_CARE_PROVIDER_SITE_OTHER): Payer: PPO

## 2021-09-20 ENCOUNTER — Telehealth: Payer: PPO

## 2021-09-20 DIAGNOSIS — E1022 Type 1 diabetes mellitus with diabetic chronic kidney disease: Secondary | ICD-10-CM

## 2021-09-20 DIAGNOSIS — I10 Essential (primary) hypertension: Secondary | ICD-10-CM

## 2021-09-20 NOTE — Patient Instructions (Signed)
Visit Information It was great speaking with you today!  Please let me know if you have any questions about our visit.   Goals Addressed             This Visit's Progress    Monitor and Manage My Blood Sugar-Diabetes Type 1       Timeframe:  Long-Range Goal Priority:  High Start Date: 09/20/21                            Expected End Date: 09/21/22                      Follow Up within 30 days    - check blood sugar at prescribed times - check blood sugar before and after exercise - check blood sugar if I feel it is too high or too low - take the blood sugar meter to all doctor visits    Why is this important?   Checking your blood sugar at home helps to keep it from getting very high or very low.  Writing the results in a diary or log helps the doctor know how to care for you.  Your blood sugar log should have the time, the date and the results.  Also, write down the amount of insulin or other medicine you take.  Other information like what you ate, exercise done and how you were feeling will also be helpful..     Notes:         Patient Care Plan: General Pharmacy (Adult)     Problem Identified: Hypertension, Hyperlipidemia, Diabetes, GERD, Chronic Kidney Disease, and History of stroke   Priority: High     Long-Range Goal: Patient-Specific Goal   Start Date: 09/20/2021  Expected End Date: 09/21/2022  This Visit's Progress: On track  Priority: High  Note:   Current Barriers:  No barriers noted  Pharmacist Clinical Goal(s):  Patient will achieve control of Diabetes as evidenced by A1c less than 7% through collaboration with PharmD and provider.   Interventions: 1:1 collaboration with [name], MD regarding development and update of comprehensive plan of care as evidenced by provider attestation and co-signature Inter-disciplinary care team collaboration (see longitudinal plan of care) Comprehensive medication review performed; medication list updated  in electronic medical record  Hypertension (BP goal <130/80) -Controlled -Current treatment: Carvedilol 25 mg twice daily  Chlorthalidone 25 mg tablet daily  Lisinopril 40 mg daily  Spironolactone 25 mg  tablet daily  -Medications previously tried: NA  -Current home readings: 130s/60s  -Denies hypotensive/hypertensive symptoms -Recommended to continue current medication  Hyperlipidemia: (LDL goal < 70) -Controlled -Current treatment: Atorvastatin 40 mg daily  -Medications previously tried: NA  -Recommended to continue current medication  Diabetes (A1c goal <7%) -Uncontrolled -Current medications: Humalog via infusion pump -Medications previously tried: NA  -Current home glucose readings -Denies hypoglycemic/hyperglycemic symptoms -Patient reports prescription through [employer] is only giving patient 3 month supply rather than usual 4 month supply. He is running out of Humalog early and needing to use a family member's supply. -Recommended to continue current medication -Will see if we are able to co-ordinate with Dr. Kelton Pillar fixing the patient's application.   Patient Goals/Self-Care Activities Patient will:  - check glucose continuously, document, and provide at future appointments  Follow Up Plan: Telephone follow up appointment with care management team member scheduled for:  03/21/22 at 9:15 AM    Patient agreed to  services and verbal consent obtained.   Patient verbalizes understanding of instructions and care plan provided today and agrees to view in [place]. Active MyChart status and patient understanding of how to access instructions and care plan via MyChart confirmed with patient.     [name], PharmD, [place], CPP Clinical Pharmacist Practitioner  [facility]  [phone]

## 2021-09-20 NOTE — Progress Notes (Signed)
Chronic Care Management Pharmacy Note  09/20/2021 Name:  KAREY STUCKI MRN:  [id number] DOB:  [birth year]  Summary: Patient presents for CCM follow-up.  -Patient reports prescription through [facility] is only giving patient 3 month supply rather than usual 4 month supply. He is running out of Humalog early and needing to use a family member's supply.  Recommendations/Changes made from today's visit: -Recommended to continue current medication -Will see if we are able to co-ordinate with Dr. Kelton Pillar fixing the patient's application.   Plan: CPP follow-up 6 months  Subjective: KENTLEY BLYDEN is an 56 y.o. year old male who is a primary patient of Ethelene Hal Mortimer Fries, MD.  The CCM team was consulted for assistance with disease management and care coordination needs.    Engaged with patient by telephone for follow up visit in response to provider referral for pharmacy case management and/or care coordination services.   Consent to Services:  The patient was given information about Chronic Care Management services, agreed to services, and gave verbal consent prior to initiation of services.  Please see initial visit note for detailed documentation.   Patient Care Team: [name], MD as PCP - General (Family Medicine) Germaine Pomfret, [facility] as Pharmacist (Pharmacist)  Recent office visits: 08/28/21: Patient presented to Dr. Ethelene Hal for follow-up.  08/15/21: Patient presented to Dr. Ethelene Hal for acrochordon.   Recent consult visits: 07/10/21: Patient presented to Dr. Joylene Grapes (Nephrology).  07/06/21: Patient presented to Dr. Kelton Pillar (Endocrinology)  Hospital visits: None in previous 6 months   Objective:  Lab Results  Component Value Date   CREATININE 2.55 (H) 08/28/2021   BUN 55 (H) 08/28/2021   GFR 27.50 (L) 08/28/2021   GFRNONAA 36 (L) 04/08/2020   GFRAA 35 (L) 01/03/2019   NA 137 08/28/2021   K 4.6 08/28/2021   CALCIUM 8.6 08/28/2021   CO2 20  08/28/2021   GLUCOSE 201 (H) 08/28/2021    Lab Results  Component Value Date/Time   HGBA1C 7.2 (A) 07/06/2021 07:31 AM   HGBA1C 6.8 (A) 01/04/2021 11:06 AM   HGBA1C 7.2 (H) 01/01/2019 04:34 AM   HGBA1C 7.7 (H) 01/20/2015 05:17 AM   GFR 27.50 (L) 08/28/2021 04:21 PM   GFR 32.54 (L) 05/31/2021 04:18 PM   MICROALBUR 14.4 (H) 05/31/2021 04:18 PM   MICROALBUR 28.4 (H) 10/28/2018 08:39 AM    Last diabetic Eye exam:  Lab Results  Component Value Date/Time   HMDIABEYEEXA No Retinopathy 02/13/2021 11:43 AM    Last diabetic Foot exam: No results found for: HMDIABFOOTEX   Lab Results  Component Value Date   CHOL 107 10/28/2018   HDL 35.10 (L) 10/28/2018   LDLCALC 60 10/28/2018   LDLDIRECT 62.0 05/31/2021   TRIG 58.0 10/28/2018   CHOLHDL 3 10/28/2018       Latest Ref Rng & Units 02/27/2021    4:00 PM 01/04/2020    1:39 PM 01/01/2019    4:34 AM  Hepatic Function  Total Protein 6.0 - 8.3 g/dL 6.6   6.0   5.7    Albumin 3.5 - 5.2 g/dL 4.1   3.8   3.2    AST 0 - 37 U/L _0 ALT 0 - 53 U/L 33   18   31    Alk Phosphatase 39 - 117 U/L 90   81   98    Total Bilirubin 0.2 - 1.2 mg/dL 0.5   0.7   1.4  Bilirubin, Direct 0.0 - 0.3 mg/dL 0.1   0.1       Lab Results  Component Value Date/Time   TSH 3.18 01/04/2020 01:39 PM   TSH 4.36 06/03/2019 04:30 PM       Latest Ref Rng & Units 05/31/2021    4:18 PM 02/27/2021    4:00 PM 07/03/2020    9:22 AM  CBC  WBC 4.0 - 10.5 K/uL 6.5   5.8   4.9    Hemoglobin 13.0 - 17.0 g/dL 13.3   12.2   12.0    Hematocrit 39.0 - 52.0 % 40.1   36.9   34.2    Platelets 150.0 - 400.0 K/uL 121.0   120.0   134.0      Lab Results  Component Value Date/Time   VD25OH 39.41 06/03/2019 04:30 PM   VD25OH 29.55 (L) 01/26/2019 09:12 AM    Clinical ASCVD: Yes  The ASCVD Risk score (Arnett DK, et al., 2019) failed to calculate for the following reasons:   The patient has a prior MI or stroke diagnosis       09/19/2021   11:12 AM 09/19/2021   11:10  AM 09/19/2021   11:08 AM  Depression screen PHQ 2/9  Decreased Interest 0 0 0  Down, Depressed, Hopeless 0 0 0  PHQ - 2 Score 0 0 0    Social History   Tobacco Use  Smoking Status Former   Packs/day: 1.00   Years: 6.00   Pack years: 6.00   Types: Cigarettes   Quit date: 04/30/1991   Years since quitting: 30.4  Smokeless Tobacco Never   BP Readings from Last 3 Encounters:  08/28/21 124/66  08/15/21 138/68  07/06/21 124/82   Pulse Readings from Last 3 Encounters:  08/28/21 80  08/15/21 74  07/06/21 92   Wt Readings from Last 3 Encounters:  08/28/21 195 lb (88.5 kg)  08/15/21 194 lb (88 kg)  07/06/21 193 lb 12.8 oz (87.9 kg)   BMI Readings from Last 3 Encounters:  08/28/21 30.54 kg/m  08/15/21 30.38 kg/m  07/06/21 30.35 kg/m    Assessment/Interventions: Review of patient past medical history, allergies, medications, health status, including review of consultants reports, laboratory and other test data, was performed as part of comprehensive evaluation and provision of chronic care management services.   SDOH:  (Social Determinants of Health) assessments and interventions performed: Yes SDOH Interventions    Flowsheet Row Most Recent Value  SDOH Interventions   Financial Strain Interventions Intervention Not Indicated      SDOH Screenings   Alcohol Screen: Low Risk    Last Alcohol Screening Score (AUDIT): 0  Depression (PHQ2-9): Low Risk    PHQ-2 Score: 0  Financial Resource Strain: Low Risk    Difficulty of Paying Living Expenses: Not hard at all  Food Insecurity: No Food Insecurity   Worried About Charity fundraiser in the Last Year: Never true   Ran Out of Food in the Last Year: Never true  Housing: Low Risk    Last Housing Risk Score: 0  Physical Activity: Insufficiently Active   Days of Exercise per Week: 2 days   Minutes of Exercise per Session: 30 min  Social Connections: Socially Integrated   Frequency of Communication with Friends and Family:  Three times a week   Frequency of Social Gatherings with Friends and Family: Three times a week   Attends Religious Services: More than 4 times per year   Active Member of Clubs  or Organizations: Yes   Attends Music therapist: More than 4 times per year   Marital Status: Married  Stress: No Stress Concern Present   Feeling of Stress : Not at all  Tobacco Use: Medium Risk   Smoking Tobacco Use: Former   Smokeless Tobacco Use: Never   Passive Exposure: Not on file  Transportation Needs: No Transportation Needs   Lack of Transportation (Medical): No   Lack of Transportation (Non-Medical): No    CCM Care Plan  No Known Allergies  Medications Reviewed Today     Reviewed by Randel Pigg, LPN (Licensed Practical Nurse) on 09/19/21 at 1107  Med List Status: <None>   Medication Order Taking? Sig Documenting Provider Last Dose Status Informant  atorvastatin (LIPITOR) 40 MG tablet [id number] Yes TAKE 1 TABLET BY MOUTH ONCE DAILY 6 IN THE Kyla Balzarine, MD Taking Active   carvedilol (COREG) 25 MG tablet [id number] Yes Take 25 mg by mouth 2 (two) times daily. [provider] Taking Active   chlorthalidone (HYGROTON) 25 MG tablet [id number] Yes Take 12.5 mg by mouth daily. [provider] Taking Active   clopidogrel (PLAVIX) 75 MG tablet [id number] Yes TAKE 1 TABLET BY MOUTH ONCE DAILY **ASPIRIN  AND  PLAVIX  FOR  3  MONTHS  AFTER  THAT  *STOP  ASPIRIN*  TAKE  PLAVIX  ALONE [name], MD Taking Active   Continuous Blood Gluc Receiver (Continental) DEVI [id number] Yes 1 Device by Does not apply route as directed. Shamleffer, Melanie Crazier, MD Taking Active Self  Continuous Blood Gluc Sensor ([place]) MISC [id number] Yes 3 Devices by Does not apply route as directed. Shamleffer, Melanie Crazier, MD Taking Active Self  Continuous Blood Gluc Transmit (DEXCOM G6 TRANSMITTER) MISC [id number] Yes Use to monitor blood sugar. Change  every 90 days. Shamleffer, Melanie Crazier, MD Taking Active   glucose blood [facility] VERIO) test strip [id number] Yes 1 each by Other route 3 (three) times daily. Use as instructed Shamleffer, Melanie Crazier, MD Taking Active   Insulin Infusion Pump (T:SLIM INSULIN PUMP) DEVI [id number] Yes by Does not apply route. [provider] Taking Active   insulin lispro (HUMALOG) 100 UNIT/ML injection [id number] Yes Max daily dose of 100 units via pump DX E10.59 (vials) Philemon Kingdom, MD Taking Active Self  lisinopril (ZESTRIL) 40 MG tablet [id number] Yes Take 40 mg by mouth daily. [provider] Taking Active   pantoprazole (PROTONIX) 40 MG tablet [id number] Yes Take 1 tablet by mouth once daily [name], MD Taking Active   spironolactone (ALDACTONE) 25 MG tablet [id number]  Take 0.5 tablets (12.5 mg total) by mouth daily. 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[place]) 04/12/2019   Seasonal allergic rhinitis 03/11/2019   Need for pneumococcal vaccination 03/11/2019   Fracture of neck of femur ([place]) 01/13/2019   AKI (acute kidney injury) ([place]) 01/08/2019   Acute blood loss anemia 01/08/2019  History of femur fracture 01/08/2019   Need for influenza vaccination 01/08/2019   Edema of both legs 01/08/2019   Hospital discharge follow-up 01/08/2019   Closed left femoral fracture ([place]) 12/31/2018   Type 1 diabetes mellitus with diabetic neuropathy, unspecified ([place])  12/17/2018   Type 1 diabetes mellitus with stage 3 chronic kidney disease ([place]) 08/12/2018   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 ([place]) 08/12/2018   Type 1 diabetes mellitus with hyperglycemia ([place]) 08/12/2018   Vitamin D deficiency 07/15/2018   Stage 3b chronic kidney disease ([place]) 06/16/2018   Screening for prostate cancer 06/15/2018   History of CVA (cerebrovascular accident) 06/01/2018   OSA (obstructive sleep apnea) 03/29/2015   Stroke ([place])    Hypothyroidism 01/19/2015   Mixed hyperlipidemia 11/22/2014   Insulin dependent diabetes mellitus    Intractable nausea and vomiting 07/27/2014   Vertigo 07/27/2014   Essential hypertension 07/27/2014   Type 1 diabetes mellitus ([place]) 05/29/2010   Hyperlipidemia 05/29/2010   Plantar fasciitis 05/29/2010    Immunization History  Administered Date(s) Administered   Influenza,inj,Quad PF,6+ Mos 07/15/2018, 01/08/2019, 02/27/2021   Influenza-Unspecified 05/29/2020   PFIZER Comirnaty([place])Covid-19 Tri-Sucrose Vaccine 07/09/2019, 07/29/2019, 05/29/2020   Pneumococcal Polysaccharide-23 03/02/2019   Pneumococcal-Unspecified 11/29/2020   Tdap 04/29/2017   Zoster Recombinat (Shingrix) 11/27/2020    Conditions to be addressed/monitored:  Hypertension, Hyperlipidemia, Diabetes, GERD, Chronic Kidney Disease, and History of stroke  Care Plan : General Pharmacy (Adult)  Updates made by Germaine Pomfret, RPH since 09/20/2021 12:00 AM     Problem: Hypertension, Hyperlipidemia, Diabetes, GERD, Chronic Kidney Disease, and History of stroke   Priority: High     Long-Range Goal: Patient-Specific Goal   Start Date: 09/20/2021  Expected End Date: 09/21/2022  This Visit's Progress: On track  Priority: High  Note:   Current Barriers:  No barriers noted  Pharmacist Clinical Goal(s):  Patient will achieve control of Diabetes as evidenced by A1c less than 7% through collaboration with PharmD and provider.    Interventions: 1:1 collaboration with [name], MD regarding development and update of comprehensive plan of care as evidenced by provider attestation and co-signature Inter-disciplinary care team collaboration (see longitudinal plan of care) Comprehensive medication review performed; medication list updated in electronic medical record  Hypertension (BP goal <130/80) -Controlled -Current treatment: Carvedilol 25 mg twice daily  Chlorthalidone 25 mg tablet daily  Lisinopril 40 mg daily  Spironolactone 25 mg  tablet daily  -Medications previously tried: NA  -Current home readings: 130s/60s  -Denies hypotensive/hypertensive symptoms -Recommended to continue current medication  Hyperlipidemia: (LDL goal < 70) -Controlled -Current treatment: Atorvastatin 40 mg daily  -Medications previously tried: NA  -Recommended to continue current medication  Diabetes (A1c goal <7%) -Uncontrolled -Current medications: Humalog via infusion pump -Medications previously tried: NA  -Current home glucose readings -Denies hypoglycemic/hyperglycemic symptoms -Patient reports prescription through [employer] is only giving patient 3 month supply rather than usual 4 month supply. He is running out of Humalog early and needing to use a family member's supply. -Recommended to continue current medication -Will see if we are able to co-ordinate with Dr. Kelton Pillar fixing the patient's application.   Patient Goals/Self-Care Activities Patient will:  - check glucose continuously, document, and provide at future appointments  Follow Up Plan: Telephone follow up appointment with care management team member scheduled for:  03/21/22 at 9:15 AM      Medication Assistance:  Humalog obtained through [employer] medication assistance program.  Enrollment ends Dec  2023. Managed by Dr. Kelton Pillar  Compliance/Adherence/Medication fill history: Care Gaps: Hepatits C Screening Shingrix  Vaccine  Star-Rating Drugs: Atorvastatin 40 mg last filled on 04/25/2021 90 day supply at [facility]. Lisinopril 40 mg last filled on 04/24/2021 90 day supply at [facility].  Patient's preferred pharmacy is:  [place], [place] - [place] [address] Phone: [phone] Fax: [phone]  [facility] [place], [place] - [address] [place] [address] Phone: [phone] Fax: [phone]  [facility] [place], [place] [address] Phone: [phone] Fax: [phone]  Uses pill box? Yes Pt endorses 100% compliance  We discussed: Current pharmacy is preferred with insurance plan and patient is satisfied with pharmacy services Patient decided to: Continue current medication management strategy  Care Plan and Follow Up Patient Decision:  Patient agrees to Care Plan and Follow-up.  Plan: Telephone follow up appointment with care management team member scheduled for:  03/21/22 at [place], PharmD, Para March, CPP Clinical Pharmacist Practitioner  [facility]  [phone]

## 2021-09-25 ENCOUNTER — Telehealth: Payer: Self-pay

## 2021-09-25 DIAGNOSIS — E109 Type 1 diabetes mellitus without complications: Secondary | ICD-10-CM | POA: Diagnosis not present

## 2021-09-25 NOTE — Progress Notes (Unsigned)
Per Clinical pharmacist, Patient receives Humalog via [facility]. Currently he has been getting 12 vials every 4 months, when previously he was getting 12 vials every 3 months.  Please call [employer] and see how the prescription was written for. We may need to reach out to Dr. Quin Hoop office to see if we can update the Rx, but let's see what we can find out about how his Rx was written.  Per [facility], the prescription they have is 100 units/ ML - max daily dose of 100 units. They did get a new rx in March, but it is what is in his chart.Also the provider mark for the patient  to receive 12 vials every 4 months with 3 refills.  [place] Pharmacist Assistant [phone]

## 2021-09-26 DIAGNOSIS — Z794 Long term (current) use of insulin: Secondary | ICD-10-CM | POA: Diagnosis not present

## 2021-09-26 DIAGNOSIS — Z87891 Personal history of nicotine dependence: Secondary | ICD-10-CM | POA: Diagnosis not present

## 2021-09-26 DIAGNOSIS — E785 Hyperlipidemia, unspecified: Secondary | ICD-10-CM | POA: Diagnosis not present

## 2021-09-26 DIAGNOSIS — I10 Essential (primary) hypertension: Secondary | ICD-10-CM

## 2021-09-26 DIAGNOSIS — E1169 Type 2 diabetes mellitus with other specified complication: Secondary | ICD-10-CM | POA: Diagnosis not present

## 2021-10-07 ENCOUNTER — Encounter (HOSPITAL_BASED_OUTPATIENT_CLINIC_OR_DEPARTMENT_OTHER): Payer: Self-pay | Admitting: Emergency Medicine

## 2021-10-07 ENCOUNTER — Other Ambulatory Visit: Payer: Self-pay

## 2021-10-07 ENCOUNTER — Emergency Department (HOSPITAL_BASED_OUTPATIENT_CLINIC_OR_DEPARTMENT_OTHER)
Admission: EM | Admit: 2021-10-07 | Discharge: 2021-10-08 | Disposition: A | Discharge status: Home / Self Care | Payer: PPO | Attending: Emergency Medicine | Admitting: Emergency Medicine

## 2021-10-07 ENCOUNTER — Emergency Department (HOSPITAL_BASED_OUTPATIENT_CLINIC_OR_DEPARTMENT_OTHER): Payer: PPO

## 2021-10-07 DIAGNOSIS — I959 Hypotension, unspecified: Secondary | ICD-10-CM | POA: Diagnosis not present

## 2021-10-07 DIAGNOSIS — R630 Anorexia: Secondary | ICD-10-CM | POA: Insufficient documentation

## 2021-10-07 DIAGNOSIS — N189 Chronic kidney disease, unspecified: Secondary | ICD-10-CM

## 2021-10-07 DIAGNOSIS — E860 Dehydration: Secondary | ICD-10-CM | POA: Diagnosis not present

## 2021-10-07 DIAGNOSIS — R110 Nausea: Secondary | ICD-10-CM | POA: Diagnosis not present

## 2021-10-07 DIAGNOSIS — Z79899 Other long term (current) drug therapy: Secondary | ICD-10-CM | POA: Diagnosis not present

## 2021-10-07 DIAGNOSIS — E119 Type 2 diabetes mellitus without complications: Secondary | ICD-10-CM | POA: Diagnosis not present

## 2021-10-07 DIAGNOSIS — D649 Anemia, unspecified: Secondary | ICD-10-CM

## 2021-10-07 DIAGNOSIS — I10 Essential (primary) hypertension: Secondary | ICD-10-CM | POA: Insufficient documentation

## 2021-10-07 DIAGNOSIS — R058 Other specified cough: Secondary | ICD-10-CM | POA: Diagnosis not present

## 2021-10-07 DIAGNOSIS — R509 Fever, unspecified: Secondary | ICD-10-CM | POA: Diagnosis not present

## 2021-10-07 DIAGNOSIS — R5383 Other fatigue: Secondary | ICD-10-CM | POA: Diagnosis not present

## 2021-10-07 DIAGNOSIS — D329 Benign neoplasm of meninges, unspecified: Secondary | ICD-10-CM

## 2021-10-07 DIAGNOSIS — N179 Acute kidney failure, unspecified: Secondary | ICD-10-CM | POA: Insufficient documentation

## 2021-10-07 DIAGNOSIS — I129 Hypertensive chronic kidney disease with stage 1 through stage 4 chronic kidney disease, or unspecified chronic kidney disease: Secondary | ICD-10-CM | POA: Diagnosis not present

## 2021-10-07 DIAGNOSIS — Z794 Long term (current) use of insulin: Secondary | ICD-10-CM | POA: Diagnosis not present

## 2021-10-07 DIAGNOSIS — D696 Thrombocytopenia, unspecified: Secondary | ICD-10-CM

## 2021-10-07 DIAGNOSIS — Z7902 Long term (current) use of antithrombotics/antiplatelets: Secondary | ICD-10-CM | POA: Insufficient documentation

## 2021-10-07 DIAGNOSIS — D72829 Elevated white blood cell count, unspecified: Secondary | ICD-10-CM | POA: Diagnosis not present

## 2021-10-07 DIAGNOSIS — R000 Tachycardia, unspecified: Secondary | ICD-10-CM | POA: Diagnosis not present

## 2021-10-07 DIAGNOSIS — R42 Dizziness and giddiness: Secondary | ICD-10-CM

## 2021-10-07 DIAGNOSIS — I6381 Other cerebral infarction due to occlusion or stenosis of small artery: Secondary | ICD-10-CM | POA: Diagnosis not present

## 2021-10-07 DIAGNOSIS — R1110 Vomiting, unspecified: Secondary | ICD-10-CM | POA: Diagnosis not present

## 2021-10-07 DIAGNOSIS — R531 Weakness: Secondary | ICD-10-CM | POA: Diagnosis not present

## 2021-10-07 MED ORDER — LACTATED RINGERS IV BOLUS
1000.0000 mL | Freq: Once | INTRAVENOUS | Status: AC
  Administered 2021-10-08: 1000 mL via INTRAVENOUS

## 2021-10-07 MED ORDER — MECLIZINE HCL 25 MG PO TABS
25.0000 mg | ORAL_TABLET | Freq: Once | ORAL | Status: AC
  Administered 2021-10-07: 25 mg via ORAL
  Filled 2021-10-07: qty 1

## 2021-10-07 NOTE — ED Triage Notes (Signed)
Brought by ems from home.  C/o weakness and dizziness for the last two days.  Symptoms started two days after arriving home from [place].

## 2021-10-08 DIAGNOSIS — R5383 Other fatigue: Secondary | ICD-10-CM | POA: Diagnosis not present

## 2021-10-08 DIAGNOSIS — J189 Pneumonia, unspecified organism: Secondary | ICD-10-CM | POA: Diagnosis not present

## 2021-10-08 DIAGNOSIS — E1065 Type 1 diabetes mellitus with hyperglycemia: Secondary | ICD-10-CM | POA: Diagnosis not present

## 2021-10-08 DIAGNOSIS — I129 Hypertensive chronic kidney disease with stage 1 through stage 4 chronic kidney disease, or unspecified chronic kidney disease: Secondary | ICD-10-CM | POA: Diagnosis not present

## 2021-10-08 DIAGNOSIS — B349 Viral infection, unspecified: Secondary | ICD-10-CM | POA: Diagnosis not present

## 2021-10-08 DIAGNOSIS — I6930 Unspecified sequelae of cerebral infarction: Secondary | ICD-10-CM | POA: Diagnosis not present

## 2021-10-08 DIAGNOSIS — R34 Anuria and oliguria: Secondary | ICD-10-CM | POA: Diagnosis not present

## 2021-10-08 DIAGNOSIS — G8929 Other chronic pain: Secondary | ICD-10-CM | POA: Diagnosis not present

## 2021-10-08 DIAGNOSIS — J90 Pleural effusion, not elsewhere classified: Secondary | ICD-10-CM | POA: Diagnosis not present

## 2021-10-08 DIAGNOSIS — E871 Hypo-osmolality and hyponatremia: Secondary | ICD-10-CM | POA: Diagnosis not present

## 2021-10-08 DIAGNOSIS — Z9641 Presence of insulin pump (external) (internal): Secondary | ICD-10-CM | POA: Diagnosis not present

## 2021-10-08 DIAGNOSIS — R509 Fever, unspecified: Secondary | ICD-10-CM | POA: Diagnosis not present

## 2021-10-08 DIAGNOSIS — D631 Anemia in chronic kidney disease: Secondary | ICD-10-CM | POA: Diagnosis not present

## 2021-10-08 DIAGNOSIS — J159 Unspecified bacterial pneumonia: Secondary | ICD-10-CM | POA: Diagnosis not present

## 2021-10-08 DIAGNOSIS — Z794 Long term (current) use of insulin: Secondary | ICD-10-CM | POA: Diagnosis not present

## 2021-10-08 DIAGNOSIS — I10 Essential (primary) hypertension: Secondary | ICD-10-CM | POA: Diagnosis not present

## 2021-10-08 DIAGNOSIS — Z87891 Personal history of nicotine dependence: Secondary | ICD-10-CM | POA: Diagnosis not present

## 2021-10-08 DIAGNOSIS — E10649 Type 1 diabetes mellitus with hypoglycemia without coma: Secondary | ICD-10-CM | POA: Diagnosis not present

## 2021-10-08 DIAGNOSIS — E860 Dehydration: Secondary | ICD-10-CM | POA: Diagnosis not present

## 2021-10-08 DIAGNOSIS — R1110 Vomiting, unspecified: Secondary | ICD-10-CM | POA: Diagnosis not present

## 2021-10-08 DIAGNOSIS — Z8673 Personal history of transient ischemic attack (TIA), and cerebral infarction without residual deficits: Secondary | ICD-10-CM | POA: Diagnosis not present

## 2021-10-08 DIAGNOSIS — N1832 Chronic kidney disease, stage 3b: Secondary | ICD-10-CM | POA: Diagnosis not present

## 2021-10-08 DIAGNOSIS — R197 Diarrhea, unspecified: Secondary | ICD-10-CM | POA: Diagnosis not present

## 2021-10-08 DIAGNOSIS — N1831 Chronic kidney disease, stage 3a: Secondary | ICD-10-CM | POA: Diagnosis not present

## 2021-10-08 DIAGNOSIS — N170 Acute kidney failure with tubular necrosis: Secondary | ICD-10-CM | POA: Diagnosis not present

## 2021-10-08 DIAGNOSIS — R058 Other specified cough: Secondary | ICD-10-CM | POA: Diagnosis not present

## 2021-10-08 DIAGNOSIS — E785 Hyperlipidemia, unspecified: Secondary | ICD-10-CM | POA: Diagnosis not present

## 2021-10-08 DIAGNOSIS — Z20822 Contact with and (suspected) exposure to covid-19: Secondary | ICD-10-CM | POA: Diagnosis not present

## 2021-10-08 DIAGNOSIS — E1022 Type 1 diabetes mellitus with diabetic chronic kidney disease: Secondary | ICD-10-CM | POA: Diagnosis not present

## 2021-10-08 DIAGNOSIS — E039 Hypothyroidism, unspecified: Secondary | ICD-10-CM | POA: Diagnosis not present

## 2021-10-08 DIAGNOSIS — N179 Acute kidney failure, unspecified: Secondary | ICD-10-CM | POA: Diagnosis not present

## 2021-10-08 DIAGNOSIS — M5489 Other dorsalgia: Secondary | ICD-10-CM | POA: Diagnosis not present

## 2021-10-08 DIAGNOSIS — E861 Hypovolemia: Secondary | ICD-10-CM | POA: Diagnosis not present

## 2021-10-08 DIAGNOSIS — D696 Thrombocytopenia, unspecified: Secondary | ICD-10-CM | POA: Diagnosis not present

## 2021-10-08 DIAGNOSIS — E1010 Type 1 diabetes mellitus with ketoacidosis without coma: Secondary | ICD-10-CM | POA: Diagnosis not present

## 2021-10-08 DIAGNOSIS — A419 Sepsis, unspecified organism: Secondary | ICD-10-CM | POA: Diagnosis not present

## 2021-10-08 LAB — COMPREHENSIVE METABOLIC PANEL
ALT: 18 U/L (ref 0–44)
AST: 14 U/L — ABNORMAL LOW (ref 15–41)
Albumin: 3.3 g/dL — ABNORMAL LOW (ref 3.5–5.0)
Alkaline Phosphatase: 85 U/L (ref 38–126)
Anion gap: 8 (ref 5–15)
BUN: 58 mg/dL — ABNORMAL HIGH (ref 6–20)
CO2: 16 mmol/L — ABNORMAL LOW (ref 22–32)
Calcium: 8.3 mg/dL — ABNORMAL LOW (ref 8.9–10.3)
Chloride: 109 mmol/L (ref 98–111)
Creatinine, Ser: 2.92 mg/dL — ABNORMAL HIGH (ref 0.61–1.24)
GFR, Estimated: 25 mL/min — ABNORMAL LOW (ref >60)
Glucose, Bld: 159 mg/dL — ABNORMAL HIGH (ref 70–99)
Potassium: 4.2 mmol/L (ref 3.5–5.1)
Sodium: 133 mmol/L — ABNORMAL LOW (ref 135–145)
Total Bilirubin: 2 mg/dL — ABNORMAL HIGH (ref 0.3–1.2)
Total Protein: 6.4 g/dL — ABNORMAL LOW (ref 6.5–8.1)

## 2021-10-08 LAB — CBC WITH DIFFERENTIAL/PLATELET
Abs Immature Granulocytes: 0.08 10*3/uL — ABNORMAL HIGH (ref 0.00–0.07)
Basophils Absolute: 0 10*3/uL (ref 0.0–0.1)
Basophils Relative: 0 %
Eosinophils Absolute: 0 10*3/uL (ref 0.0–0.5)
Eosinophils Relative: 0 %
HCT: 34 % — ABNORMAL LOW (ref 39.0–52.0)
Hemoglobin: 11.6 g/dL — ABNORMAL LOW (ref 13.0–17.0)
Immature Granulocytes: 1 %
Lymphocytes Relative: 4 %
Lymphs Abs: 0.5 10*3/uL — ABNORMAL LOW (ref 0.7–4.0)
MCH: 30 pg (ref 26.0–34.0)
MCHC: 34.1 g/dL (ref 30.0–36.0)
MCV: 87.9 fL (ref 80.0–100.0)
Monocytes Absolute: 1.2 10*3/uL — ABNORMAL HIGH (ref 0.1–1.0)
Monocytes Relative: 8 %
Neutro Abs: 12.7 10*3/uL — ABNORMAL HIGH (ref 1.7–7.7)
Neutrophils Relative %: 87 %
Platelets: 100 10*3/uL — ABNORMAL LOW (ref 150–400)
RBC: 3.87 MIL/uL — ABNORMAL LOW (ref 4.22–5.81)
RDW: 12.9 % (ref 11.5–15.5)
Smear Review: NORMAL
WBC: 14.5 10*3/uL — ABNORMAL HIGH (ref 4.0–10.5)
nRBC: 0 % (ref 0.0–0.2)

## 2021-10-08 LAB — TROPONIN I (HIGH SENSITIVITY)
Troponin I (High Sensitivity): 14 ng/L (ref <18)
Troponin I (High Sensitivity): 14 ng/L (ref <18)

## 2021-10-08 LAB — MAGNESIUM: Magnesium: 1.4 mg/dL — ABNORMAL LOW (ref 1.7–2.4)

## 2021-10-08 MED ORDER — ONDANSETRON 4 MG PO TBDP: 4.0000 mg | ORAL_TABLET | Freq: Three times a day (TID) | ORAL | 0 refills | Status: DC | PRN

## 2021-10-08 MED ORDER — MAGNESIUM SULFATE 2 GM/50ML IV SOLN
2.0000 g | Freq: Once | INTRAVENOUS | Status: AC
  Administered 2021-10-08: 2 g via INTRAVENOUS
  Filled 2021-10-08: qty 50

## 2021-10-08 NOTE — ED Notes (Signed)
Patient ambulated to and from restroom and changed his clothes.

## 2021-10-08 NOTE — ED Notes (Signed)
Patient ambulated to restroom with steady gait.

## 2021-10-08 NOTE — ED Provider Notes (Signed)
[facility]ERGENCY DEPARTMENT Provider Note   CSN: [id number] Arrival date & time: 10/07/21  2238     History  Chief Complaint  Patient presents with   Weakness   Dizziness    Adrian Neal is a 56 y.o. male.  56 yo M here with history of multiple strokes back in 2016 on Plavix, diabetes, hypertension and hyperlipidemia the presents the ER today with feeling weak.  Patient states he was on a business trip in [place] and flew back couple days ago.  States over the last 48 hours has had decreased appetite some nausea seems to be worse with movement.  Also with some ago symptoms similar to the past.  Patient states that he was worried he might be having a stroke as he had some weakness and balance issues when he had a stroke in the past.  No particular headache or other neurologic changes.  He does have some pain in between his shoulder blades is not there now.  This does not seem to be exertional.  No chest pain.  Shortness of breath only when he takes a deep breath because of coughing.  No productive cough.  No trauma.  Patient states when he stands up the dizziness is feeling like things are moving when he knows they are not.   Weakness Associated symptoms: dizziness   Dizziness Associated symptoms: weakness        Home Medications Prior to Admission medications   Medication Sig Start Date End Date Taking? Authorizing Provider  ondansetron (ZOFRAN-ODT) 4 MG disintegrating tablet Take 1 tablet (4 mg total) by mouth every 8 (eight) hours as needed. '4mg'$  ODT q4 hours prn nausea/vomit 10/08/21  Yes Kenyotta Dorfman, Corene Cornea, MD  atorvastatin (LIPITOR) 40 MG tablet TAKE 1 TABLET BY MOUTH ONCE DAILY 6 IN THE EVENING 07/24/21   [name], MD  carvedilol (COREG) 25 MG tablet Take 25 mg by mouth 2 (two) times daily. 04/17/20   [provider]  chlorthalidone (HYGROTON) 25 MG tablet Take 25 mg by mouth daily. 02/19/21   [provider]  clopidogrel (PLAVIX)  75 MG tablet TAKE 1 TABLET BY MOUTH ONCE DAILY **ASPIRIN  AND  PLAVIX  FOR  3  MONTHS  AFTER  THAT  *STOP  ASPIRIN*  TAKE  PLAVIX  ALONE 04/23/21   [name], MD  Continuous Blood Gluc Receiver (DEXCOM G6 RECEIVER) DEVI 1 Device by Does not apply route as directed. 08/12/18   Shamleffer, Melanie Crazier, MD  Continuous Blood Gluc Sensor (DEXCOM G6 SENSOR) MISC 3 Devices by Does not apply route as directed. 08/12/18   Shamleffer, Melanie Crazier, MD  Continuous Blood Gluc Transmit (DEXCOM G6 TRANSMITTER) MISC Use to monitor blood sugar. Change every 90 days. 10/02/20   Shamleffer, Melanie Crazier, MD  glucose blood (ONETOUCH VERIO) test strip 1 each by Other route 3 (three) times daily. Use as instructed 01/04/21   Shamleffer, Melanie Crazier, MD  Insulin Infusion Pump (T:SLIM INSULIN PUMP) DEVI by Does not apply route.    [provider]  insulin lispro (HUMALOG) 100 UNIT/ML injection Max daily dose of 100 units via pump DX E10.59 (vials) 11/10/18   Philemon Kingdom, MD  lisinopril (ZESTRIL) 40 MG tablet Take 40 mg by mouth daily. 01/29/21   [provider]  pantoprazole (PROTONIX) 40 MG tablet Take 1 tablet by mouth once daily 09/17/21   [name], MD  spironolactone (ALDACTONE) 25 MG tablet Take 0.5 tablets (12.5 mg total) by mouth  daily. 06/09/20 08/28/21  Haydee Salter, MD      Allergies    Patient has no known allergies.    Review of Systems   Review of Systems  Neurological:  Positive for dizziness and weakness.    Physical Exam Updated Vital Signs BP (!) 146/55   Pulse 95   Temp 98 F (36.7 C)   Resp 18   Ht '5\' 7"'$  (1.702 m)   Wt 86.2 kg   SpO2 98%   BMI 29.76 kg/m  Physical Exam Vitals and nursing note reviewed.  Constitutional:      Appearance: He is well-developed.  HENT:     Head: Normocephalic and atraumatic.     Mouth/Throat:     Mouth: Mucous membranes are dry.     Pharynx: Oropharynx is clear.  Eyes:     Pupils: Pupils are  equal, round, and reactive to light.  Cardiovascular:     Rate and Rhythm: Normal rate and regular rhythm.  Pulmonary:     Effort: Pulmonary effort is normal. No respiratory distress.  Abdominal:     General: Abdomen is flat. There is no distension.     Tenderness: There is no abdominal tenderness.  Musculoskeletal:        General: No swelling or tenderness. Normal range of motion.     Cervical back: Normal range of motion.  Neurological:     General: No focal deficit present.     Mental Status: He is alert and oriented to person, place, and time.     Comments: Equal grip strength, equal bicep strength, equal tricep strength, sensation to light touch intact in both hands and both feet, able to lift both legs off the bed for greater than 10 seconds, normal and symmetric dorsiflexion/plantarflexion bilaterally.  Intact sensation to all 3 zones of his face, symmetric smile, symmetric eyebrow raise, symmetric uvula raises.  Does have nystagmus with rightward gaze.  Otherwise extraocular movements are intact.  Pupils are equal round reactive to light.  Vision is at baseline.     ED Results / Procedures / Treatments   Labs (all labs ordered are listed, but only abnormal results are displayed) Labs Reviewed  CBC WITH DIFFERENTIAL/PLATELET - Abnormal; Notable for the following components:      Result Value   WBC 14.5 (*)    RBC 3.87 (*)    Hemoglobin 11.6 (*)    HCT 34.0 (*)    Platelets 100 (*)    Neutro Abs 12.7 (*)    Lymphs Abs 0.5 (*)    Monocytes Absolute 1.2 (*)    Abs Immature Granulocytes 0.08 (*)    All other components within normal limits  COMPREHENSIVE METABOLIC PANEL - Abnormal; Notable for the following components:   Sodium 133 (*)    CO2 16 (*)    Glucose, Bld 159 (*)    BUN 58 (*)    Creatinine, Ser 2.92 (*)    Calcium 8.3 (*)    Total Protein 6.4 (*)    Albumin 3.3 (*)    AST 14 (*)    Total Bilirubin 2.0 (*)    GFR, Estimated 25 (*)    All other components  within normal limits  MAGNESIUM - Abnormal; Notable for the following components:   Magnesium 1.4 (*)    All other components within normal limits  URINALYSIS, ROUTINE W REFLEX MICROSCOPIC  TROPONIN I (HIGH SENSITIVITY)  TROPONIN I (HIGH SENSITIVITY)    EKG EKG Interpretation  Date/Time:  Sunday  October 07 2021 23:26:18 EDT Ventricular Rate:  90 PR Interval:  144 QRS Duration: 91 QT Interval:  344 QTC Calculation: 421 R Axis:   36 Text Interpretation: Sinus rhythm RSR' in V1 or V2, probably normal variant Minimal ST elevation, anterior leads Confirmed by Merrily Pew [phone]) on 10/07/2021 11:52:32 PM  Radiology DG Chest 2 View  Result Date: 10/08/2021 CLINICAL DATA:  Weakness and dizziness for 2 days. EXAM: CHEST - 2 VIEW COMPARISON:  PA Lat 04/08/2020. FINDINGS: The heart size and mediastinal contours are within normal limits with atherosclerosis in the aortic arch. Both lungs are clear. The visualized skeletal structures are unremarkable apart from thoracic spondylosis. IMPRESSION: No active cardiopulmonary disease. Stable chest with aortic atherosclerosis. Electronically Signed   By: Telford Nab M.D.   On: 10/08/2021 00:21   CT Head Wo Contrast  Result Date: 10/08/2021 CLINICAL DATA:  Dizziness and weakness for the past 2 days. EXAM: CT HEAD WITHOUT CONTRAST TECHNIQUE: Contiguous axial images were obtained from the base of the skull through the vertex without intravenous contrast. RADIATION DOSE REDUCTION: This exam was performed according to the departmental dose-optimization program which includes automated exposure control, adjustment of the mA and/or kV according to patient size and/or use of iterative reconstruction technique. COMPARISON:  CT head and MRI brain both 01/19/2015. FINDINGS: Brain: There are early changes of cerebral atrophy and small-vessel disease. The ventricles are normal in size and position. There is a small chronic lacunar infarct in the medial aspect of the  left cerebellar peduncle and a small chronic left pontine lacunar infarct. The cerebellum, right-sided brainstem are unremarkable. No asymmetry is seen concerning for acute infarct, hemorrhage or mass in the parenchyma. The basal cisterns are clear. There is a 5 x 8 mm lateral right frontal extra-axial calcified meningioma not seen previously. This has a chronic appearance. Vascular: The carotid siphons and distal vertebral arteries are heavily calcified. There are no hyperdense central vessels. Skull: The calvarium, skull base and orbits are intact. No skull lesion is seen. Sinuses/Orbits: Old right lens extraction. There is mild membrane thickening in the paranasal sinuses without fluid level, chronic deviation of the nasal septum to the left. There is patchy fluid in the lower left mastoid air cells not seen previously. The right mastoids and both middle ear cavities are clear. Other: None. IMPRESSION: 1. No acute intracranial CT findings. 2. Vascular calcifications and chronic changes. 3. New but chronic appearing 5 x 8 mm lateral right frontal extra-axial calcified meningioma. 4. Old lacunar infarcts. 5. Sinus and left mastoid disease. Electronically Signed   By: Telford Nab M.D.   On: 10/08/2021 00:19    Procedures Procedures    Medications Ordered in ED Medications  meclizine (ANTIVERT) tablet 25 mg (25 mg Oral Given 10/07/21 2357)  lactated ringers bolus 1,000 mL (0 mLs Intravenous Stopped 10/08/21 0133)  magnesium sulfate IVPB 2 g 50 mL (0 g Intravenous Stopped 10/08/21 0133)    ED Course/ Medical Decision Making/ A&P                           Medical Decision Making Amount and/or Complexity of Data Reviewed Labs: ordered. Radiology: ordered. ECG/medicine tests: ordered.  Risk Prescription drug management.  Suspect likely viral bug causing decreased intake and subsequent dehydration. Also could be vertigo. Doubt cva but obviously at risk for this and cardiac issues so will screen  for both.   Review of labs show low Magnesium - will  replete, also acute on chronic kidney disease, will hydrate as it seems likely prerenal.  Will follow up with PCP for recheck of kidney function.   Discussed his newly abnormal findings on CBC with Dr. Alen Blew with Oncology. He recommends rechecking cbc after kidney function improves (with his PCP) and if persistent problem then may need a hem consult at that time.   Patient improved here with fluids. Ambulates without significant difficulty. Suspect dehydration possibly related to viral issue vs stress from travel. Lower suspicion for any significant infection from being out of the country.   Final Clinical Impression(s) / ED Diagnoses Final diagnoses:  Dehydration  Acute renal failure superimposed on chronic kidney disease, unspecified CKD stage, unspecified acute renal failure type (HCC)  Anemia, unspecified type  Leukocytosis, unspecified type  Thrombocytopenia (HCC)  Vertigo  Meningioma ([place])    Rx / DC Orders ED Discharge Orders          Ordered    ondansetron (ZOFRAN-ODT) 4 MG disintegrating tablet  Every 8 hours PRN        10/08/21 0228              Allizon Woznick, Corene Cornea, MD 10/08/21 [phone]

## 2021-10-09 DIAGNOSIS — E1010 Type 1 diabetes mellitus with ketoacidosis without coma: Secondary | ICD-10-CM | POA: Diagnosis not present

## 2021-10-09 DIAGNOSIS — J189 Pneumonia, unspecified organism: Secondary | ICD-10-CM | POA: Insufficient documentation

## 2021-10-09 DIAGNOSIS — R509 Fever, unspecified: Secondary | ICD-10-CM | POA: Diagnosis not present

## 2021-10-10 DIAGNOSIS — J189 Pneumonia, unspecified organism: Secondary | ICD-10-CM | POA: Diagnosis not present

## 2021-10-10 DIAGNOSIS — R509 Fever, unspecified: Secondary | ICD-10-CM | POA: Diagnosis not present

## 2021-10-11 DIAGNOSIS — R509 Fever, unspecified: Secondary | ICD-10-CM | POA: Diagnosis not present

## 2021-10-11 DIAGNOSIS — E1010 Type 1 diabetes mellitus with ketoacidosis without coma: Secondary | ICD-10-CM | POA: Diagnosis not present

## 2021-10-12 DIAGNOSIS — N179 Acute kidney failure, unspecified: Secondary | ICD-10-CM | POA: Diagnosis not present

## 2021-10-12 DIAGNOSIS — I10 Essential (primary) hypertension: Secondary | ICD-10-CM | POA: Diagnosis not present

## 2021-10-12 DIAGNOSIS — B349 Viral infection, unspecified: Secondary | ICD-10-CM | POA: Diagnosis not present

## 2021-10-12 DIAGNOSIS — Z8673 Personal history of transient ischemic attack (TIA), and cerebral infarction without residual deficits: Secondary | ICD-10-CM | POA: Diagnosis not present

## 2021-10-12 DIAGNOSIS — E1010 Type 1 diabetes mellitus with ketoacidosis without coma: Secondary | ICD-10-CM | POA: Diagnosis not present

## 2021-10-12 DIAGNOSIS — J189 Pneumonia, unspecified organism: Secondary | ICD-10-CM | POA: Diagnosis not present

## 2021-10-12 DIAGNOSIS — R509 Fever, unspecified: Secondary | ICD-10-CM | POA: Diagnosis not present

## 2021-10-12 DIAGNOSIS — N1831 Chronic kidney disease, stage 3a: Secondary | ICD-10-CM | POA: Diagnosis not present

## 2021-10-13 DIAGNOSIS — N1831 Chronic kidney disease, stage 3a: Secondary | ICD-10-CM | POA: Diagnosis not present

## 2021-10-13 DIAGNOSIS — I10 Essential (primary) hypertension: Secondary | ICD-10-CM | POA: Diagnosis not present

## 2021-10-13 DIAGNOSIS — E1010 Type 1 diabetes mellitus with ketoacidosis without coma: Secondary | ICD-10-CM | POA: Diagnosis not present

## 2021-10-13 DIAGNOSIS — B349 Viral infection, unspecified: Secondary | ICD-10-CM | POA: Diagnosis not present

## 2021-10-13 DIAGNOSIS — N179 Acute kidney failure, unspecified: Secondary | ICD-10-CM | POA: Diagnosis not present

## 2021-10-13 DIAGNOSIS — Z8673 Personal history of transient ischemic attack (TIA), and cerebral infarction without residual deficits: Secondary | ICD-10-CM | POA: Diagnosis not present

## 2021-10-13 DIAGNOSIS — R509 Fever, unspecified: Secondary | ICD-10-CM | POA: Diagnosis not present

## 2021-10-13 DIAGNOSIS — J189 Pneumonia, unspecified organism: Secondary | ICD-10-CM | POA: Diagnosis not present

## 2021-10-14 DIAGNOSIS — R509 Fever, unspecified: Secondary | ICD-10-CM | POA: Diagnosis not present

## 2021-10-14 DIAGNOSIS — N179 Acute kidney failure, unspecified: Secondary | ICD-10-CM | POA: Diagnosis not present

## 2021-10-14 DIAGNOSIS — I10 Essential (primary) hypertension: Secondary | ICD-10-CM | POA: Diagnosis not present

## 2021-10-14 DIAGNOSIS — E1010 Type 1 diabetes mellitus with ketoacidosis without coma: Secondary | ICD-10-CM | POA: Diagnosis not present

## 2021-10-14 DIAGNOSIS — J189 Pneumonia, unspecified organism: Secondary | ICD-10-CM | POA: Diagnosis not present

## 2021-10-14 DIAGNOSIS — Z8673 Personal history of transient ischemic attack (TIA), and cerebral infarction without residual deficits: Secondary | ICD-10-CM | POA: Diagnosis not present

## 2021-10-14 DIAGNOSIS — B349 Viral infection, unspecified: Secondary | ICD-10-CM | POA: Diagnosis not present

## 2021-10-14 DIAGNOSIS — N1831 Chronic kidney disease, stage 3a: Secondary | ICD-10-CM | POA: Diagnosis not present

## 2021-10-15 DIAGNOSIS — R509 Fever, unspecified: Secondary | ICD-10-CM | POA: Diagnosis not present

## 2021-10-17 ENCOUNTER — Inpatient Hospital Stay: Payer: PPO | Admitting: Family Medicine

## 2021-10-19 ENCOUNTER — Encounter: Payer: Self-pay | Admitting: Family Medicine

## 2021-10-19 ENCOUNTER — Ambulatory Visit (INDEPENDENT_AMBULATORY_CARE_PROVIDER_SITE_OTHER): Payer: PPO | Admitting: Family Medicine

## 2021-10-19 VITALS — BP 154/70 | HR 88 | Temp 97.9°F | Ht 67.0 in | Wt 191.0 lb

## 2021-10-19 DIAGNOSIS — Z09 Encounter for follow-up examination after completed treatment for conditions other than malignant neoplasm: Secondary | ICD-10-CM | POA: Diagnosis not present

## 2021-10-19 NOTE — Progress Notes (Signed)
   Established Patient Office Visit  Subjective   Patient ID: Adrian Neal, male    DOB: 1966/01/22  Age: 56 y.o. MRN: [id number]  Chief Complaint  Patient presents with   Hospitalization [facility] follow up no concerns.     HPI    Review of Systems  Constitutional: Negative.   HENT: Negative.    Eyes:  Negative for blurred vision, discharge and redness.  Respiratory: Negative.    Cardiovascular: Negative.   Gastrointestinal:  Negative for abdominal pain.  Genitourinary: Negative.   Musculoskeletal: Negative.  Negative for myalgias.  Skin:  Negative for rash.  Neurological:  Negative for tingling, loss of consciousness and weakness.  Endo/Heme/Allergies:  Negative for polydipsia.      Objective:     BP (!) 154/70 (BP Location: Left Arm, Patient Position: Sitting, Cuff Size: Large)   Pulse 88   Temp 97.9 F (36.6 C) (Temporal)   Ht '5\' 7"'$  (1.702 m)   Wt 191 lb (86.6 kg)   SpO2 97%   BMI 29.91 kg/m  Wt Readings from Last 3 Encounters:  10/19/21 191 lb (86.6 kg)  10/07/21 190 lb (86.2 kg)  08/28/21 195 lb (88.5 kg)      Physical Exam Constitutional:      General: He is not in acute distress.    Appearance: Normal appearance. He is not ill-appearing, toxic-appearing or diaphoretic.  HENT:     Head: Normocephalic and atraumatic.     Right Ear: External ear normal.     Left Ear: External ear normal.     Mouth/Throat:     Mouth: Mucous membranes are moist.     Pharynx: Oropharynx is clear. No oropharyngeal exudate or posterior oropharyngeal erythema.  Eyes:     General: No scleral icterus.       Right eye: No discharge.        Left eye: No discharge.     Extraocular Movements: Extraocular movements intact.     Conjunctiva/sclera: Conjunctivae normal.     Pupils: Pupils are equal, round, and reactive to light.  Cardiovascular:     Rate and Rhythm: Normal rate and regular rhythm.  Pulmonary:     Effort: Pulmonary effort is normal. No  respiratory distress.     Breath sounds: Normal breath sounds.  Musculoskeletal:     Cervical back: No rigidity or tenderness.  Skin:    General: Skin is warm and dry.  Neurological:     Mental Status: He is alert and oriented to person, place, and time.  Psychiatric:        Mood and Affect: Mood normal.        Behavior: Behavior normal.      No results found for any visits on 10/19/21.    The ASCVD Risk score (Arnett DK, et al., 2019) failed to calculate for the following reasons:   The patient has a prior MI or stroke diagnosis    Assessment & Plan:   Problem List Items Addressed This Visit       Other   Hospital discharge follow-up - Primary    Return in about 6 weeks (around 11/30/2021), or if symptoms worsen or fail to improve.    [name], MD

## 2021-10-24 DIAGNOSIS — R509 Fever, unspecified: Secondary | ICD-10-CM | POA: Diagnosis not present

## 2021-10-26 ENCOUNTER — Encounter: Payer: Self-pay | Admitting: Family Medicine

## 2021-10-29 ENCOUNTER — Telehealth: Payer: Self-pay

## 2021-10-29 NOTE — Progress Notes (Signed)
Chronic Care Management Pharmacy Assistant   Name: Adrian Neal  MRN: [id number] DOB: 1966-03-30  Reason for Encounter: Medication Review/General Adherence Call.   Recent office visits:  10/19/2021 Dr. Ethelene Hal MD (PCP) No medication Changes noted, return in 6 weeks  Recent consult visits:  10/24/2021 Dr. Darcus Austin MD (Infection disease) No Medication Changes noted  Hospital visits:  Medication Reconciliation was completed by comparing discharge summary, patient's EMR and Pharmacy list, and upon discussion with patient.  Admitted to the hospital on 10/08/2021 due to Fever. Discharge date was 10/15/2021. Discharged from [place]?Medications Started at [facility] Discharge:?? -started None ID  Medication Changes at Hospital Discharge: -Changed None ID  Medications Discontinued at Hospital Discharge: -Stopped None ID  Medications that remain the same after Hospit Admitted to the hospital on 10/07/2021 due to Dehydration. Discharge date was 10/08/2021. Discharged from [place]?Medications Started at [facility] Discharge:?? -started Zofran 4 mg PRN  Medication Changes at Hospital Discharge: -Changed None ID  Medications Discontinued at Hospital Discharge: -Stopped None ID  Medications that remain the same after Hospital Discharge:??  -All other medications will remain the same.    Medications: Outpatient Encounter Medications as of 10/29/2021  Medication Sig Note   atorvastatin (LIPITOR) 40 MG tablet TAKE 1 TABLET BY MOUTH ONCE DAILY 6 IN THE EVENING    carvedilol (COREG) 25 MG tablet Take 25 mg by mouth 2 (two) times daily.    chlorthalidone (HYGROTON) 25 MG tablet Take 25 mg by mouth daily. 09/20/2021: Prescribed by [name], MD   clopidogrel (PLAVIX) 75 MG tablet TAKE 1 TABLET BY MOUTH ONCE DAILY **ASPIRIN  AND  PLAVIX  FOR  3  MONTHS  AFTER  THAT  *STOP  ASPIRIN*  TAKE  PLAVIX  ALONE    Continuous Blood  Gluc Receiver (DEXCOM G6 RECEIVER) DEVI 1 Device by Does not apply route as directed.    Continuous Blood Gluc Sensor (DEXCOM G6 SENSOR) MISC 3 Devices by Does not apply route as directed.    Continuous Blood Gluc Transmit (DEXCOM G6 TRANSMITTER) MISC Use to monitor blood sugar. Change every 90 days.    glucose blood (ONETOUCH VERIO) test strip 1 each by Other route 3 (three) times daily. Use as instructed    Insulin Infusion Pump (T:SLIM INSULIN PUMP) DEVI by Does not apply route.    insulin lispro (HUMALOG) 100 UNIT/ML injection Max daily dose of 100 units via pump DX E10.59 (vials)    lisinopril (ZESTRIL) 40 MG tablet Take 40 mg by mouth daily.    pantoprazole (PROTONIX) 40 MG tablet Take 1 tablet by mouth once daily    spironolactone (ALDACTONE) 25 MG tablet Take 0.5 tablets (12.5 mg total) by mouth daily.    No facility-administered encounter medications on file as of 10/29/2021.    Care Gaps: Hepatitis C Screening HTN: 154/70 on 10/19/2021  Star Rating Drugs: Atorvastatin 40 mg last filled on 07/24/2021 90 day supply at [facility]. Lisinopril 40 mg last filled on 10/19/2021 90 day supply at [facility].  Medication Fill Gaps: None ID  General adherence call:  I reach out to patient due to recent hospitalization. Patient was treated for DKA, pneumonia and kidney injury.There was no change with patient chronic medications or any new medications added.Patient had his Hospitalization follow up with his PCP on 10/19/2021 which there was no medication changes.   Patient states he is breathing better  with no issues.Patient denies shortness of breath.Patient states his blood sugar varies, but while on the phone his sugar level was 125.Patient reports he feels he is doing better since he was release from the hospital, and denies any questions or concerns for the clinical pharmacist.Patient is aware he can return my call any time.  [place] Pharmacist  Assistant [phone]

## 2021-10-31 DIAGNOSIS — E109 Type 1 diabetes mellitus without complications: Secondary | ICD-10-CM | POA: Diagnosis not present

## 2021-11-01 DIAGNOSIS — N184 Chronic kidney disease, stage 4 (severe): Secondary | ICD-10-CM | POA: Diagnosis not present

## 2021-11-07 DIAGNOSIS — N2581 Secondary hyperparathyroidism of renal origin: Secondary | ICD-10-CM | POA: Diagnosis not present

## 2021-11-07 DIAGNOSIS — I129 Hypertensive chronic kidney disease with stage 1 through stage 4 chronic kidney disease, or unspecified chronic kidney disease: Secondary | ICD-10-CM | POA: Diagnosis not present

## 2021-11-07 DIAGNOSIS — D631 Anemia in chronic kidney disease: Secondary | ICD-10-CM | POA: Diagnosis not present

## 2021-11-07 DIAGNOSIS — N184 Chronic kidney disease, stage 4 (severe): Secondary | ICD-10-CM | POA: Diagnosis not present

## 2021-11-07 DIAGNOSIS — E1022 Type 1 diabetes mellitus with diabetic chronic kidney disease: Secondary | ICD-10-CM | POA: Diagnosis not present

## 2022-01-08 NOTE — Progress Notes (Deleted)
Name: Adrian Neal  Age/ Sex: 56 y.o., male   MRN/ DOB: [id number], 1965/06/27     PCP: [name], MD   Reason for Endocrinology Evaluation: Type 1 Diabetes Mellitus  Initial Endocrine Consultative Visit: 06/18/2018    PATIENT IDENTIFIER: Adrian Neal is a 56 y.o. male with a past medical history of .HTN,Hyperlipidemia and Hx of CVA  The patient has followed with Endocrinology clinic since 06/18/2018 for consultative assistance with management of his diabetes.  DIABETIC HISTORY:  [name] was diagnosed with T1DM at age 51, he has been on insulin pump since 2012. His hemoglobin A1c has ranged from 7.2% , peaking at 9.0%   In 11/2018 he switched to the T-Slim   SUBJECTIVE:   During the last visit (07/06/2021): A1c 7.2 %. we adjusted his I:C ratio during the day      Today (01/08/2022): [name] is here for a follow up on diabetes management and his new pump.  He checks his blood sugars multiple times daily, preprandial and bedtime. The patient has had hypoglycemic episodes since the last clinic visit, which typically occur 2 x /week - most often occuring during the day after meals. The patient is symptomatic with these episodes.   Had an ED visit for fever in 09/2021 , Dx [facility] PNA Follows with Dr. Joylene Grapes for  CKD IV  Denies nausea or vomiting or diarrhea     This patient with type 1 diabetes is treated with humalog (insulin pump). The clinical list was updated.     Current pump settings    Pump   T-Slim      Insulin type   HUMALOG    Basal rate       0000 1.1 u/h    0600 1.0   1100 0.9          I:C ratio   0000-0600 1:9   0600-1100  1:6   1100-0000 1:8      Sensitivity      0000 30      AIT      0000  5      Goal       0000  110             Type & Model of Pump:T-Slim  Insulin Type: Currently using Humalog   PUMP STATISTICS:  2/24-07/05/2021 Average BG Dashboard : 122 Average daily Carbs 165 Average Total  Daily Insulin:  81.54 u/day  Average Daily Basal: 29(34 %) Average Daily Bolus: 57 (66 %)       Statin: yes ACE-I/ARB: yes      DIABETIC COMPLICATIONS: Microvascular complications:  CKD IV, 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  Denies: Neuropathy  Last eye exam: Completed 02/2021   Macrovascular complications:  CVA Denies: CAD, PVD       HISTORY:  Past Medical History:  Past Medical History:  Diagnosis Date   Cataract    Mixed form OS   Chronic kidney disease    Diabetes mellitus without complication ([place])    diagnosed at age 26   Eye problems    Heart murmur 1996   High cholesterol    patient denies but take preventative medicine   Hypertension    Hypertensive retinopathy    OU   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   Sleep apnea    on BiPAP   Stroke ([place]) 2016   Past Surgical History:  Past Surgical  History:  Procedure Laterality Date   ANTERIOR APPROACH HEMI HIP ARTHROPLASTY Left 01/01/2019   Procedure: ANTERIOR APPROACH total hip ARTHROPLASTY;  Surgeon: Rod Can, MD;  Location: [facility];  Service: Orthopedics;  Laterality: Left;   CATARACT EXTRACTION Right 1994   CATARACT EXTRACTION W/ INTRAOCULAR LENS IMPLANT  1994   EYE SURGERY Right 1991   vitrectomy   EYE SURGERY Right 1992   scar tissue removed from retina   EYE SURGERY Right 1994   Cat Sx   Social History:  reports that he quit smoking about 30 years ago. His smoking use included cigarettes. He has a 6.00 pack-year smoking history. He has never used smokeless tobacco. He reports that he does not drink alcohol and does not use drugs. Family History:  Family History  Problem Relation Age of Onset   Hypertension Mother    Hyperlipidemia Mother    Hyperlipidemia Father    Hypertension Father    Diabetes Father    Kidney disease Father        had a kidney transplant    Stroke Brother    Diabetes Brother    Pulmonary fibrosis Paternal Grandmother    Lung cancer Paternal Grandfather     Diabetes Daughter    Colon cancer Neg Hx    Esophageal cancer Neg Hx    Rectal cancer Neg Hx    Stomach cancer Neg Hx     HOME MEDICATIONS: Allergies as of 01/09/2022   No Known Allergies      Medication List        Accurate as of January 08, 2022  7:59 PM. If you have any questions, ask your nurse or doctor.          atorvastatin 40 MG tablet Commonly known as: LIPITOR TAKE 1 TABLET BY MOUTH ONCE DAILY 6 IN THE EVENING   carvedilol 25 MG tablet Commonly known as: COREG Take 25 mg by mouth 2 (two) times daily.   chlorthalidone 25 MG tablet Commonly known as: HYGROTON Take 25 mg by mouth daily.   clopidogrel 75 MG tablet Commonly known as: PLAVIX TAKE 1 TABLET BY MOUTH ONCE DAILY **ASPIRIN  AND  PLAVIX  FOR  3  MONTHS  AFTER  THAT  *STOP  ASPIRIN*  TAKE  PLAVIX  ALONE   Dexcom G6 Receiver Devi 1 Device by Does not apply route as directed.   Dexcom G6 Sensor Misc 3 Devices by Does not apply route as directed.   Dexcom G6 Transmitter Misc Use to monitor blood sugar. Change every 90 days.   insulin lispro 100 UNIT/ML injection Commonly known as: HumaLOG Max daily dose of 100 units via pump DX E10.59 (vials)   lisinopril 40 MG tablet Commonly known as: ZESTRIL Take 40 mg by mouth daily.   OneTouch Verio test strip Generic drug: glucose blood 1 each by Other route 3 (three) times daily. Use as instructed   pantoprazole 40 MG tablet Commonly known as: PROTONIX Take 1 tablet by mouth once daily   spironolactone 25 MG tablet Commonly known as: ALDACTONE Take 0.5 tablets (12.5 mg total) by mouth daily.   T:slim Insulin Pump Devi by Does not apply route.       PHYSICAL EXAM: VS: There were no vitals taken for this visit.   EXAM: General: Pt appears well and is in NAD  Lungs: Clear with good BS bilat with no rales, rhonchi, or wheezes  Heart: Auscultation: RRR   Extremities:  BL LE: no pretibial edema  Mental Status: Judgment, insight:  intact Orientation: oriented to time, place, and person Mood and affect: no depression, anxiety, or agitation   DM Foot Exam 01/04/2021  The skin of the feet is without sores or ulcerations, toe nails dystrophic The pedal pulses are 1+ on right and 1+ on left. The sensation is intact  to a screening 5.07, 10 gram monofilament bilaterally      DATA REVIEWED:  Lab Results  Component Value Date   HGBA1C 7.2 (A) 07/06/2021   HGBA1C 6.8 (A) 01/04/2021   HGBA1C 6.9 (A) 05/23/2020    Latest Reference Range & Units 05/31/21 16:18  Sodium 135 - 145 mEq/L 139  Potassium 3.5 - 5.1 mEq/L 4.7  Chloride 96 - 112 mEq/L 107  CO2 19 - 32 mEq/L 25  Glucose 70 - 99 mg/dL 100 (H)  BUN 6 - 23 mg/dL 53 (H)  Creatinine 0.40 - 1.50 mg/dL 2.22 (H)  Calcium 8.4 - 10.5 mg/dL 9.2      ASSESSMENT / PLAN / RECOMMENDATIONS:   1) Type 1 Diabetes Mellitus, Optimally controlled, With CKD IV, retinopathic and  macrovascular complications - Most recent A1c of 7.2 %. Goal A1c < 7.5 %.   - His A1c remains at goal but slightly higher than previously -He has been noted with hypoglycemia overnight, will increase basal insulin at midnight  -Patient was encouraged to continue to bolus with each meal as he has been noted to forget bolusing at times   MEDICATIONS:  Pump   T-Slim      Insulin type   HUMALOG    Basal rate       0000   1.1 u/h    0600 1.0u/h   1100 0.9      I:C ratio   0000-0600 1:9   0600-1100  1:6   1100-0000 1:8      Sensitivity      0000 30      AIT      0000  5      Goal       0000  110      EDUCATION / INSTRUCTIONS: BG monitoring instructions: Patient is instructed to check his blood sugars 4 times a day, before meals and bedtime. Call [facility] if: BG persistently < 70  I reviewed the Rule of 15 for the treatment of hypoglycemia in detail with the patient. Literature supplied.    F/U in 6 months      Signed electronically by: Mack Guise, MD  [facility]  [address] [place], [place] [address] Phone: [phone] FAX: [phone]   CC: [name], [place] [address] Phone: [phone]  Fax: [phone]  Return to Endocrinology clinic as below: Future Appointments  Date Time Provider [place]  01/09/2022  9:30 AM Edman Lipsey, Melanie Crazier, MD [facility] None  02/28/2022  3:20 PM [name], MD [facility]  03/21/2022  9:15 AM [facility] PHARMACIST [facility]  04/19/2022  8:30 AM [name], MD [facility] None

## 2022-01-09 ENCOUNTER — Ambulatory Visit: Payer: PPO | Admitting: Internal Medicine

## 2022-01-11 DIAGNOSIS — E109 Type 1 diabetes mellitus without complications: Secondary | ICD-10-CM | POA: Diagnosis not present

## 2022-02-05 DIAGNOSIS — I129 Hypertensive chronic kidney disease with stage 1 through stage 4 chronic kidney disease, or unspecified chronic kidney disease: Secondary | ICD-10-CM | POA: Diagnosis not present

## 2022-02-05 DIAGNOSIS — N184 Chronic kidney disease, stage 4 (severe): Secondary | ICD-10-CM | POA: Diagnosis not present

## 2022-02-05 DIAGNOSIS — Z8673 Personal history of transient ischemic attack (TIA), and cerebral infarction without residual deficits: Secondary | ICD-10-CM | POA: Diagnosis not present

## 2022-02-05 DIAGNOSIS — N2581 Secondary hyperparathyroidism of renal origin: Secondary | ICD-10-CM | POA: Diagnosis not present

## 2022-02-05 DIAGNOSIS — D631 Anemia in chronic kidney disease: Secondary | ICD-10-CM | POA: Diagnosis not present

## 2022-02-05 DIAGNOSIS — E1022 Type 1 diabetes mellitus with diabetic chronic kidney disease: Secondary | ICD-10-CM | POA: Diagnosis not present

## 2022-02-06 ENCOUNTER — Telehealth: Payer: Self-pay | Admitting: Family Medicine

## 2022-02-08 ENCOUNTER — Telehealth: Payer: Self-pay

## 2022-02-08 NOTE — Progress Notes (Signed)
    Chronic Care Management Pharmacy Assistant   Name: Adrian Neal  MRN: [id number] DOB: Apr 16, 1966   Reason for Encounter: Medication Review/Patient assistance renewal for Humalog.   Recent office visits:  None ID  Recent consult visits:  11/07/2021 [name]- Unable to see note 10/31/2021 [name] to see note  Hospital visits:  None in previous 6 months  Medications: Outpatient Encounter Medications as of 02/08/2022  Medication Sig Note   atorvastatin (LIPITOR) 40 MG tablet TAKE 1 TABLET BY MOUTH ONCE DAILY 6 IN THE EVENING    carvedilol (COREG) 25 MG tablet Take 25 mg by mouth 2 (two) times daily.    chlorthalidone (HYGROTON) 25 MG tablet Take 25 mg by mouth daily. 09/20/2021: Prescribed by [name], MD   clopidogrel (PLAVIX) 75 MG tablet TAKE 1 TABLET BY MOUTH ONCE DAILY **ASPIRIN  AND  PLAVIX  FOR  3  MONTHS  AFTER  THAT  *STOP  ASPIRIN*  TAKE  PLAVIX  ALONE    Continuous Blood Gluc Receiver (DEXCOM G6 RECEIVER) DEVI 1 Device by Does not apply route as directed.    Continuous Blood Gluc Sensor (DEXCOM G6 SENSOR) MISC 3 Devices by Does not apply route as directed.    Continuous Blood Gluc Transmit (DEXCOM G6 TRANSMITTER) MISC Use to monitor blood sugar. Change every 90 days.    glucose blood (ONETOUCH VERIO) test strip 1 each by Other route 3 (three) times daily. Use as instructed    Insulin Infusion Pump (T:SLIM INSULIN PUMP) DEVI by Does not apply route.    insulin lispro (HUMALOG) 100 UNIT/ML injection Max daily dose of 100 units via pump DX E10.59 (vials)    lisinopril (ZESTRIL) 40 MG tablet Take 40 mg by mouth daily.    pantoprazole (PROTONIX) 40 MG tablet Take 1 tablet by mouth once daily    spironolactone (ALDACTONE) 25 MG tablet Take 0.5 tablets (12.5 mg total) by mouth daily.    No facility-administered encounter medications on file as of 02/08/2022.   Care Gaps: Hepatitis C Screening HTN: 154/70 on 10/19/2021 Shingrix  Vaccine Influenza Vaccine Hemoglobin A1C   Star Rating Drugs: Atorvastatin 40 mg last filled on 10/31/2021 90 day supply at [facility]. Lisinopril 40 mg last filled on 02/02/2022 90 day supply at [facility].   Patient assistance renewal for Humalog:  I received a task from [name], CPP requesting that I start the renewal application for patient assistance on the medication Humalog to continue assistance through year 2024.    Unable to speak  with the patient to inform him that the application will be mailed.Once he receives the application he will need to complete his part of the application and return it to his Endocrinologist office  to fax over to the [employer] for processing. Patient should include a copy of her/his proof of income.     Application emailed to [name], CPP for review and to mail to patient home.  [place] Pharmacist Assistant [phone]

## 2022-02-09 ENCOUNTER — Other Ambulatory Visit: Payer: Self-pay | Admitting: Family Medicine

## 2022-02-09 DIAGNOSIS — E782 Mixed hyperlipidemia: Secondary | ICD-10-CM

## 2022-02-13 DIAGNOSIS — E103593 Type 1 diabetes mellitus with proliferative diabetic retinopathy without macular edema, bilateral: Secondary | ICD-10-CM | POA: Diagnosis not present

## 2022-02-13 DIAGNOSIS — H35033 Hypertensive retinopathy, bilateral: Secondary | ICD-10-CM | POA: Diagnosis not present

## 2022-02-13 DIAGNOSIS — H5203 Hypermetropia, bilateral: Secondary | ICD-10-CM | POA: Diagnosis not present

## 2022-02-13 DIAGNOSIS — H2512 Age-related nuclear cataract, left eye: Secondary | ICD-10-CM | POA: Diagnosis not present

## 2022-02-13 DIAGNOSIS — E109 Type 1 diabetes mellitus without complications: Secondary | ICD-10-CM | POA: Diagnosis not present

## 2022-02-28 ENCOUNTER — Ambulatory Visit: Payer: PPO | Admitting: Family Medicine

## 2022-02-28 NOTE — Telephone Encounter (Signed)
error 

## 2022-03-04 ENCOUNTER — Encounter: Payer: Self-pay | Admitting: Family Medicine

## 2022-03-04 ENCOUNTER — Ambulatory Visit (INDEPENDENT_AMBULATORY_CARE_PROVIDER_SITE_OTHER): Payer: PPO | Admitting: Family Medicine

## 2022-03-04 VITALS — BP 126/60 | HR 76 | Temp 97.8°F | Ht 67.0 in | Wt 195.0 lb

## 2022-03-04 DIAGNOSIS — M8580 Other specified disorders of bone density and structure, unspecified site: Secondary | ICD-10-CM

## 2022-03-04 DIAGNOSIS — Z0000 Encounter for general adult medical examination without abnormal findings: Secondary | ICD-10-CM | POA: Diagnosis not present

## 2022-03-04 DIAGNOSIS — I10 Essential (primary) hypertension: Secondary | ICD-10-CM

## 2022-03-04 DIAGNOSIS — E782 Mixed hyperlipidemia: Secondary | ICD-10-CM | POA: Diagnosis not present

## 2022-03-04 DIAGNOSIS — Z125 Encounter for screening for malignant neoplasm of prostate: Secondary | ICD-10-CM

## 2022-03-04 DIAGNOSIS — Z23 Encounter for immunization: Secondary | ICD-10-CM

## 2022-03-04 NOTE — Progress Notes (Unsigned)
Established Patient Office Visit  Subjective   Patient ID: Adrian Neal, male    DOB: 08-08-1965  Age: 56 y.o. MRN: [id number]  Chief Complaint  Patient presents with   Follow-up    6 month follow up no concerns.    HPI for 78-monthfollow-up and health check.  Continues follow-up with endocrinology for type 1 diabetes.  Missed his last appointment because he went to the wrong office.  Ongoing follow-up with nephrology 3 weeks ago.  Status post dilated eye exam a few weeks ago as well.  Difficult for him to exercise.  He has ongoing lightheadedness.  He ate about 3 hours ago.  Cannot fast for 6 hours secondary to type 1 diabetes.  No issues with urine flow or stooling.  He does not have regular dental care.  {History (Optional):23778}  Review of Systems  Constitutional: Negative.   HENT: Negative.    Eyes:  Negative for blurred vision, discharge and redness.  Respiratory: Negative.    Cardiovascular: Negative.   Gastrointestinal:  Negative for abdominal pain, blood in stool, constipation and melena.  Genitourinary: Negative.  Negative for dysuria, frequency and urgency.  Musculoskeletal:  Positive for joint pain and myalgias.  Skin:  Negative for rash.  Neurological:  Positive for dizziness. Negative for tingling, loss of consciousness, weakness and headaches.  Endo/Heme/Allergies:  Negative for polydipsia.      03/04/2022    3:52 PM 10/19/2021   11:31 AM 09/19/2021   11:12 AM  Depression screen PHQ 2/9  Decreased Interest 0 0 0  Down, Depressed, Hopeless 0 0 0  PHQ - 2 Score 0 0 0       Objective:     BP 126/60 (BP Location: Right Arm, Patient Position: Sitting, Cuff Size: Normal)   Pulse 76   Temp 97.8 F (36.6 C) (Temporal)   Ht '5\' 7"'$  (1.702 m)   Wt 195 lb (88.5 kg)   SpO2 96%   BMI 30.54 kg/m  BP Readings from Last 3 Encounters:  03/04/22 126/60  10/19/21 (!) 154/70  10/08/21 (!) 146/55   Wt Readings from Last 3 Encounters:  03/04/22 195 lb (88.5 kg)   10/19/21 191 lb (86.6 kg)  10/07/21 190 lb (86.2 kg)      Physical Exam Constitutional:      General: He is not in acute distress.    Appearance: Normal appearance. He is not ill-appearing, toxic-appearing or diaphoretic.  HENT:     Head: Normocephalic and atraumatic.     Right Ear: External ear normal.     Left Ear: External ear normal.     Mouth/Throat:     Mouth: Mucous membranes are moist.     Pharynx: Oropharynx is clear. No oropharyngeal exudate or posterior oropharyngeal erythema.  Eyes:     General: No scleral icterus.       Right eye: No discharge.        Left eye: No discharge.     Extraocular Movements: Extraocular movements intact.     Conjunctiva/sclera: Conjunctivae normal.     Pupils: Pupils are equal, round, and reactive to light.  Cardiovascular:     Rate and Rhythm: Normal rate and regular rhythm.  Pulmonary:     Effort: Pulmonary effort is normal. No respiratory distress.     Breath sounds: Normal breath sounds.  Abdominal:     General: Bowel sounds are normal.  Musculoskeletal:     Cervical back: No rigidity or tenderness.  Skin:  General: Skin is warm and dry.  Neurological:     Mental Status: He is alert and oriented to person, place, and time.  Psychiatric:        Mood and Affect: Mood normal.        Behavior: Behavior normal.      No results found for any visits on 03/04/22.  {Labs (Optional):23779}  The ASCVD Risk score (Arnett DK, et al., 2019) failed to calculate for the following reasons:   The patient has a prior MI or stroke diagnosis    Assessment & Plan:   Problem List Items Addressed This Visit       Cardiovascular and Mediastinum   Essential hypertension   Relevant Orders   CBC   Comprehensive metabolic panel     Musculoskeletal and Integument   Osteopenia     Other   Mixed hyperlipidemia   Relevant Orders   LDL cholesterol, direct   Healthcare maintenance   Relevant Orders   PSA   Need for influenza  vaccination - Primary   Relevant Orders   Flu Vaccine QUAD 6+ mos PF IM (Fluarix Quad PF)    No follow-ups on file.  Reminded him to consume 1200 mg of calcium with 800 international units of vitamin D daily.  Advised him to exercise, possibly with a stationary bike.  Urged him to see the dentist for routine care.  Of course, follow-up with endocrinology, nephrology as directed.  Could consider physical therapy.   [name], MD

## 2022-03-05 ENCOUNTER — Other Ambulatory Visit: Payer: PPO

## 2022-03-05 LAB — CBC
HCT: 37.2 % — ABNORMAL LOW (ref 39.0–52.0)
Hemoglobin: 12.3 g/dL — ABNORMAL LOW (ref 13.0–17.0)
MCHC: 32.9 g/dL (ref 30.0–36.0)
MCV: 88.6 fl (ref 78.0–100.0)
Platelets: 102 10*3/uL — ABNORMAL LOW (ref 150.0–400.0)
RBC: 4.2 Mil/uL — ABNORMAL LOW (ref 4.22–5.81)
RDW: 13.9 % (ref 11.5–15.5)
WBC: 5.7 10*3/uL (ref 4.0–10.5)

## 2022-03-05 LAB — COMPREHENSIVE METABOLIC PANEL
ALT: 27 U/L (ref 0–53)
AST: 19 U/L (ref 0–37)
Albumin: 4.3 g/dL (ref 3.5–5.2)
Alkaline Phosphatase: 109 U/L (ref 39–117)
BUN: 56 mg/dL — ABNORMAL HIGH (ref 6–23)
CO2: 22 mEq/L (ref 19–32)
Calcium: 9.2 mg/dL (ref 8.4–10.5)
Chloride: 109 mEq/L (ref 96–112)
Creatinine, Ser: 2.47 mg/dL — ABNORMAL HIGH (ref 0.40–1.50)
GFR: 28.47 mL/min — ABNORMAL LOW (ref >60.00)
Glucose, Bld: 98 mg/dL (ref 70–99)
Potassium: 4.7 mEq/L (ref 3.5–5.1)
Sodium: 139 mEq/L (ref 135–145)
Total Bilirubin: 0.5 mg/dL (ref 0.2–1.2)
Total Protein: 6.9 g/dL (ref 6.0–8.3)

## 2022-03-05 LAB — PSA: PSA: 0.66 ng/mL (ref 0.10–4.00)

## 2022-03-05 LAB — LDL CHOLESTEROL, DIRECT: Direct LDL: 63 mg/dL

## 2022-03-06 ENCOUNTER — Encounter: Payer: Self-pay | Admitting: Family Medicine

## 2022-03-07 ENCOUNTER — Ambulatory Visit (INDEPENDENT_AMBULATORY_CARE_PROVIDER_SITE_OTHER): Payer: PPO

## 2022-03-07 DIAGNOSIS — I10 Essential (primary) hypertension: Secondary | ICD-10-CM

## 2022-03-07 DIAGNOSIS — E782 Mixed hyperlipidemia: Secondary | ICD-10-CM

## 2022-03-07 DIAGNOSIS — E1040 Type 1 diabetes mellitus with diabetic neuropathy, unspecified: Secondary | ICD-10-CM

## 2022-03-07 NOTE — Patient Instructions (Signed)
Visit Information It was great speaking with you today!  Please let me know if you have any questions about our visit.  Patient Care Plan: General Pharmacy (Adult)     Problem Identified: Hypertension, Hyperlipidemia, Diabetes, GERD, Chronic Kidney Disease, and History of stroke   Priority: High     Long-Range Goal: Patient-Specific Goal   Start Date: 09/20/2021  Expected End Date: 09/21/2022  This Visit's Progress: On track  Recent Progress: On track  Priority: High  Note:   Current Barriers:  No barriers noted  Pharmacist Clinical Goal(s):  Patient will achieve control of Diabetes as evidenced by A1c less than 7% through collaboration with PharmD and provider.   Interventions: 1:1 collaboration with [name], MD regarding development and update of comprehensive plan of care as evidenced by provider attestation and co-signature Inter-disciplinary care team collaboration (see longitudinal plan of care) Comprehensive medication review performed; medication list updated in electronic medical record  Hypertension (BP goal <130/80) -Controlled -Current treatment: Carvedilol 25 mg twice daily  Chlorthalidone 25 mg tablet twice daily  Lisinopril 40 mg daily  Spironolactone 12.5 mg  tablet daily  -Medications previously tried: NA  -Current home readings: 130s/60s  -Denies hypotensive/hypertensive symptoms -Recommended to continue current medication  Hyperlipidemia: (LDL goal < 70) -Controlled -Current treatment: Atorvastatin 40 mg daily  -Current treatment: Aspirin 81 mg daily  -Medications previously tried: NA  -Recommended to continue current medication  Diabetes (A1c goal <7%) -Uncontrolled -Current medications: Humalog via infusion pump -Medications previously tried: NA  -Current home glucose readings -Denies hypoglycemic/hyperglycemic symptoms -Recommended to continue current medication  Chronic Kidney Disease Stage 4  -All medications assessed for  renal dosing and appropriateness in chronic kidney disease. -Recommended to continue current medication   Patient Goals/Self-Care Activities Patient will:  - check glucose continuously, document, and provide at future appointments  Follow Up Plan: Telephone follow up appointment with care management team member scheduled for:  10/10/2022 at 11:00 AM    Patient agreed to services and verbal consent obtained.   Print copy of patient instructions, educational materials, and care plan provided in person.   [name], PharmD, [place], CPP Clinical Pharmacist Practitioner  [facility]  [phone]

## 2022-03-07 NOTE — Progress Notes (Signed)
Chronic Care Management Pharmacy Note  03/07/2022 Name:  Adrian Neal MRN:  [id number] DOB:  03/27/1966  Summary: Patient presents for CCM follow-up. Patient is doing well today. His blood pressures remain under control. He is in progress of re-enrolling in patient assistance for his insulin. He will bring the completed documentation Dr. Kelton Pillar for signature.   Recommendations/Changes made from today's visit: -Recommended to continue current medication  Plan: CPP follow-up 6 months  Subjective: Adrian Neal is an 56 y.o. year old male who is a primary patient of Ethelene Hal Mortimer Fries, MD.  The CCM team was consulted for assistance with disease management and care coordination needs.    Engaged with patient by telephone for follow up visit in response to provider referral for pharmacy case management and/or care coordination services.   Consent to Services:  The patient was given information about Chronic Care Management services, agreed to services, and gave verbal consent prior to initiation of services.  Please see initial visit note for detailed documentation.   Patient Care Team: [name], MD as PCP - General (Family Medicine) Germaine Pomfret, [facility] as Pharmacist (Pharmacist)  Recent office visits: 03/04/22: Patient presented to Dr. Ethelene Hal for follow-up.  10/19/21: Patient presented to Dr. Ethelene Hal for acrochordon.   Recent consult visits: 11/07/21: Patient presented to Dr. Joylene Grapes (Nephrology).   Hospital visits: 10/08/21: Patient presented to ED for fever.   Objective:  Lab Results  Component Value Date   CREATININE 2.47 (H) 03/05/2022   BUN 56 (H) 03/05/2022   GFR 28.47 (L) 03/05/2022   GFRNONAA 25 (L) 10/07/2021   GFRAA 35 (L) 01/03/2019   NA 139 03/05/2022   K 4.7 03/05/2022   CALCIUM 9.2 03/05/2022   CO2 22 03/05/2022   GLUCOSE 98 03/05/2022    Lab Results  Component Value Date/Time   HGBA1C 7.2 (A) 07/06/2021 07:31 AM    HGBA1C 6.8 (A) 01/04/2021 11:06 AM   HGBA1C 7.2 (H) 01/01/2019 04:34 AM   HGBA1C 7.7 (H) 01/20/2015 05:17 AM   GFR 28.47 (L) 03/05/2022 08:27 AM   GFR 27.50 (L) 08/28/2021 04:21 PM   MICROALBUR 14.4 (H) 05/31/2021 04:18 PM   MICROALBUR 28.4 (H) 10/28/2018 08:39 AM    Last diabetic Eye exam:  Lab Results  Component Value Date/Time   HMDIABEYEEXA No Retinopathy 02/13/2021 11:43 AM    Last diabetic Foot exam: No results found for: "HMDIABFOOTEX"   Lab Results  Component Value Date   CHOL 107 10/28/2018   HDL 35.10 (L) 10/28/2018   LDLCALC 60 10/28/2018   LDLDIRECT 63.0 03/05/2022   TRIG 58.0 10/28/2018   CHOLHDL 3 10/28/2018       Latest Ref Rng & Units 03/05/2022    8:27 AM 10/07/2021   11:30 PM 02/27/2021    4:00 PM  Hepatic Function  Total Protein 6.0 - 8.3 g/dL 6.9  6.4  6.6   Albumin 3.5 - 5.2 g/dL 4.3  3.3  4.1   AST 0 - 37 U/L _0 ALT 0 - 53 U/L 27  18  33   Alk Phosphatase 39 - 117 U/L 109  85  90   Total Bilirubin 0.2 - 1.2 mg/dL 0.5  2.0  0.5   Bilirubin, Direct 0.0 - 0.3 mg/dL   0.1     Lab Results  Component Value Date/Time   TSH 3.18 01/04/2020 01:39 PM   TSH 4.36 06/03/2019 04:30 PM  Latest Ref Rng & Units 03/05/2022    8:27 AM 10/07/2021   11:30 PM 05/31/2021    4:18 PM  CBC  WBC 4.0 - 10.5 K/uL 5.7  14.5  6.5   Hemoglobin 13.0 - 17.0 g/dL 12.3  11.6  13.3   Hematocrit 39.0 - 52.0 % 37.2  34.0  40.1   Platelets 150.0 - 400.0 K/uL 102.0  100  121.0     Lab Results  Component Value Date/Time   VD25OH 39.41 06/03/2019 04:30 PM   VD25OH 29.55 (L) 01/26/2019 09:12 AM    Clinical ASCVD: Yes  The ASCVD Risk score (Arnett DK, et al., 2019) failed to calculate for the following reasons:   The patient has a prior MI or stroke diagnosis       03/04/2022    3:52 PM 10/19/2021   11:31 AM 09/19/2021   11:12 AM  Depression screen PHQ 2/9  Decreased Interest 0 0 0  Down, Depressed, Hopeless 0 0 0  PHQ - 2 Score 0 0 0    Social History    Tobacco Use  Smoking Status Former   Packs/day: 1.00   Years: 6.00   Total pack years: 6.00   Types: Cigarettes   Quit date: 04/30/1991   Years since quitting: 30.8  Smokeless Tobacco Never   BP Readings from Last 3 Encounters:  03/04/22 126/60  10/19/21 (!) 154/70  10/08/21 (!) 146/55   Pulse Readings from Last 3 Encounters:  03/04/22 76  10/19/21 88  10/08/21 95   Wt Readings from Last 3 Encounters:  03/04/22 195 lb (88.5 kg)  10/19/21 191 lb (86.6 kg)  10/07/21 190 lb (86.2 kg)   BMI Readings from Last 3 Encounters:  03/04/22 30.54 kg/m  10/19/21 29.91 kg/m  10/07/21 29.76 kg/m    Assessment/Interventions: Review of patient past medical history, allergies, medications, health status, including review of consultants reports, laboratory and other test data, was performed as part of comprehensive evaluation and provision of chronic care management services.   SDOH:  (Social Determinants of Health) assessments and interventions performed: Yes SDOH Interventions    Flowsheet Row Chronic Care Management from 09/20/2021 in [place] from 09/19/2021 in [place] Management from 12/13/2019 in [place] Interventions -- Intervention Not Indicated --  Housing Interventions -- Intervention Not Indicated --  Transportation Interventions -- Intervention Not Indicated Intervention Not Indicated  Financial Strain Interventions Intervention Not Indicated Intervention Not Indicated Intervention Not Indicated  Physical Activity Interventions -- Intervention Not Indicated --  Stress Interventions -- Intervention Not Indicated --  Social Connections Interventions -- Intervention Not Indicated --      SDOH Screenings   Food Insecurity: No Food Insecurity (09/19/2021)  Housing: Low Risk  (09/19/2021)  Transportation Needs: No Transportation  Needs (09/19/2021)  Alcohol Screen: Low Risk  (09/19/2021)  Depression (PHQ2-9): Low Risk  (03/04/2022)  Financial Resource Strain: Low Risk  (09/20/2021)  Physical Activity: Insufficiently Active (09/19/2021)  Social Connections: Socially Integrated (09/19/2021)  Stress: No Stress Concern Present (09/19/2021)  Tobacco Use: Medium Risk (03/06/2022)    [place]  No Known Allergies  Medications Reviewed Today     Reviewed by [name], MD (Physician) on 03/06/22 at 1053  Med List Status: <None>   Medication Order Taking? Sig Documenting Provider Last Dose Status Informant  atorvastatin (LIPITOR) 40 MG tablet [id number] Yes TAKE 1 TABLET BY  MOUTH ONCE DAILY AT  6  IN  THE  Gerri Spore, Mortimer Fries, MD Taking Active   carvedilol (COREG) 25 MG tablet [id number] Yes Take 25 mg by mouth 2 (two) times daily. [provider] Taking Active   chlorthalidone (HYGROTON) 25 MG tablet [id number] Yes Take 25 mg by mouth daily. [provider] Taking Active            Med Note Rudy Jew Sep 20, 2021  9:37 AM) Prescribed by [name], MD  clopidogrel (PLAVIX) 75 MG tablet [id number] Yes TAKE 1 TABLET BY MOUTH ONCE DAILY **ASPIRIN  AND  PLAVIX  FOR  3  MONTHS  AFTER  THAT  *STOP  ASPIRIN*  TAKE  PLAVIX  ALONE [name], MD Taking Active   Continuous Blood Gluc Receiver ([place]) DEVI [id number] Yes 1 Device by Does not apply route as directed. Shamleffer, Melanie Crazier, MD Taking Active Self  Continuous Blood Gluc Sensor ([place]) MISC [id number] Yes 3 Devices by Does not apply route as directed. Shamleffer, Melanie Crazier, MD Taking Active Self  Continuous Blood Gluc Transmit (DEXCOM G6 TRANSMITTER) MISC [id number] Yes Use to monitor blood sugar. Change every 90 days. Shamleffer, Melanie Crazier, MD Taking Active   glucose blood [facility] VERIO) test strip [id number] Yes 1 each by Other route 3 (three) times daily. Use  as instructed Shamleffer, Melanie Crazier, MD Taking Active   Insulin Infusion Pump (T:SLIM INSULIN PUMP) DEVI [id number] Yes by Does not apply route. [provider] Taking Active   insulin lispro (HUMALOG) 100 UNIT/ML injection [id number] Yes Max daily dose of 100 units via pump DX E10.59 (vials) Philemon Kingdom, MD Taking Active Self  lisinopril (ZESTRIL) 40 MG tablet [id number] Yes Take 40 mg by mouth daily. [provider] Taking Active   pantoprazole (PROTONIX) 40 MG tablet [id number] Yes Take 1 tablet by mouth once daily [name], MD Taking Active   spironolactone (ALDACTONE) 25 MG tablet [id number]  Take 0.5 tablets (12.5 mg total) by mouth daily. 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[place]) 04/12/2019   Seasonal allergic rhinitis 03/11/2019   Need for pneumococcal vaccination 03/11/2019   Fracture of neck of femur ([place]) 01/13/2019   AKI (acute kidney injury) ([place]) 01/08/2019   Acute blood loss anemia 01/08/2019   History of femur fracture 01/08/2019   Need for influenza vaccination 01/08/2019   Edema of both legs 01/08/2019   Hospital discharge follow-up 01/08/2019   Closed left femoral fracture ([place]) 12/31/2018   Type 1 diabetes mellitus with diabetic neuropathy, unspecified ([place]) 12/17/2018   Type 1 diabetes mellitus with stage 3 chronic kidney disease ([place]) 08/12/2018   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 ([place]) 08/12/2018    Type 1 diabetes mellitus with hyperglycemia ([place]) 08/12/2018   Vitamin D deficiency 07/15/2018   Stage 3b chronic kidney disease ([place]) 06/16/2018   Healthcare maintenance  06/15/2018   History of CVA (cerebrovascular accident) 06/01/2018   OSA (obstructive sleep apnea) 03/29/2015   Stroke ([place])    Hypothyroidism 01/19/2015   Mixed hyperlipidemia 11/22/2014   Insulin dependent diabetes mellitus    Intractable nausea and vomiting 07/27/2014   Vertigo 07/27/2014   Essential hypertension 07/27/2014   Type 1 diabetes mellitus ([place]) 05/29/2010   Hyperlipidemia 05/29/2010   Plantar fasciitis 05/29/2010    Immunization History  Administered Date(s) Administered   Influenza,inj,Quad PF,6+ Mos 07/15/2018, 01/08/2019, 02/27/2021, 03/04/2022   Influenza-Unspecified 05/29/2020   PFIZER Comirnaty([place])Covid-19 Tri-Sucrose Vaccine 07/09/2019, 07/29/2019, 05/29/2020   Pneumococcal Polysaccharide-23 03/02/2019   Pneumococcal-Unspecified 11/29/2020   Tdap 04/29/2017   Zoster Recombinat (Shingrix) 11/27/2020    Conditions to be addressed/monitored:  Hypertension, Hyperlipidemia, Diabetes, GERD, Chronic Kidney Disease, and History of stroke  Care Plan : General Pharmacy (Adult)  Updates made by Germaine Pomfret, [place] since 03/07/2022 12:00 AM     Problem: Hypertension, Hyperlipidemia, Diabetes, GERD, Chronic Kidney Disease, and History of stroke   Priority: High     Long-Range Goal: Patient-Specific Goal   Start Date: 09/20/2021  Expected End Date: 09/21/2022  This Visit's Progress: On track  Recent Progress: On track  Priority: High  Note:   Current Barriers:  No barriers noted  Pharmacist Clinical Goal(s):  Patient will achieve control of Diabetes as evidenced by A1c less than 7% through collaboration with PharmD and provider.   Interventions: 1:1 collaboration with [name], MD regarding development and update of comprehensive plan of care as evidenced by provider  attestation and co-signature Inter-disciplinary care team collaboration (see longitudinal plan of care) Comprehensive medication review performed; medication list updated in electronic medical record  Hypertension (BP goal <130/80) -Controlled -Current treatment: Carvedilol 25 mg twice daily  Chlorthalidone 25 mg tablet twice daily  Lisinopril 40 mg daily  Spironolactone 12.5 mg  tablet daily  -Medications previously tried: NA  -Current home readings: 130s/60s  -Denies hypotensive/hypertensive symptoms -Recommended to continue current medication  Hyperlipidemia: (LDL goal < 70) -Controlled -Current treatment: Atorvastatin 40 mg daily  -Current treatment: Aspirin 81 mg daily  -Medications previously tried: NA  -Recommended to continue current medication  Diabetes (A1c goal <7%) -Uncontrolled -Current medications: Humalog via infusion pump -Medications previously tried: NA  -Current home glucose readings -Denies hypoglycemic/hyperglycemic symptoms -Recommended to continue current medication  Chronic Kidney Disease Stage 4  -All medications assessed for renal dosing and appropriateness in chronic kidney disease. -Recommended to continue current medication   Patient Goals/Self-Care Activities Patient will:  - check glucose continuously, document, and provide at future appointments  Follow Up Plan: Telephone follow up appointment with care management team member scheduled for:  10/10/2022 at 11:00 AM    Medication Assistance:  Humalog obtained through [employer] medication assistance program.  Enrollment ends Dec 2023. Managed by Dr. Kelton Pillar  Compliance/Adherence/Medication fill history: Care Gaps: Hepatits C Screening Shingrix Vaccine  Star-Rating Drugs: Atorvastatin 40 mg last filled on 04/25/2021 90 day supply at [facility]. Lisinopril 40 mg last filled on 04/24/2021 90 day supply at [facility].  Patient's preferred pharmacy is:  [place], [place] - [address] [place] [address] Phone: [phone] Fax: [phone]  [facility] [place], [place] - [address] [place] [address] Phone: [phone] Fax: [phone]  [facility] [place], [place]  [place] [address] Phone: [phone] Fax: [phone]  Uses pill box? Yes Pt endorses 100% compliance  We discussed: Current pharmacy is preferred with insurance plan and patient is satisfied with pharmacy services Patient decided to: Continue current medication management strategy  Care Plan and Follow Up Patient Decision:  Patient agrees to Care Plan and Follow-up.  Plan: Telephone follow up appointment with care management team member scheduled for:  10/10/2022 at 11:00 AM  [name], PharmD, [place], CPP Clinical Pharmacist Practitioner  [facility]  [phone]

## 2022-03-15 DIAGNOSIS — E109 Type 1 diabetes mellitus without complications: Secondary | ICD-10-CM | POA: Diagnosis not present

## 2022-03-21 ENCOUNTER — Telehealth: Payer: PPO

## 2022-03-26 NOTE — Addendum Note (Signed)
Addended by: Jon Billings on: 03/26/2022 12:59 PM   Modules accepted: Orders

## 2022-03-26 NOTE — Addendum Note (Signed)
Addended by: Jon Billings on: 03/26/2022 12:54 PM   Modules accepted: Orders

## 2022-03-28 DIAGNOSIS — E1040 Type 1 diabetes mellitus with diabetic neuropathy, unspecified: Secondary | ICD-10-CM

## 2022-03-28 DIAGNOSIS — I10 Essential (primary) hypertension: Secondary | ICD-10-CM

## 2022-03-28 DIAGNOSIS — E782 Mixed hyperlipidemia: Secondary | ICD-10-CM

## 2022-04-11 ENCOUNTER — Telehealth: Payer: Self-pay | Admitting: Internal Medicine

## 2022-04-11 NOTE — Telephone Encounter (Signed)
Patient brought in completed [employer] Patient Assistance Program Application today.  The application is in Dr. Quin Hoop folder in front office.

## 2022-04-12 ENCOUNTER — Telehealth: Payer: Self-pay

## 2022-04-12 NOTE — Telephone Encounter (Signed)
[facility] application has been filled out and faxed back

## 2022-04-15 DIAGNOSIS — E109 Type 1 diabetes mellitus without complications: Secondary | ICD-10-CM | POA: Diagnosis not present

## 2022-04-17 DIAGNOSIS — S91052A Open bite, left ankle, initial encounter: Secondary | ICD-10-CM | POA: Diagnosis not present

## 2022-04-17 DIAGNOSIS — W5581XA Bitten by other mammals, initial encounter: Secondary | ICD-10-CM | POA: Diagnosis not present

## 2022-04-17 DIAGNOSIS — S8990XA Unspecified injury of unspecified lower leg, initial encounter: Secondary | ICD-10-CM | POA: Diagnosis not present

## 2022-04-18 NOTE — Progress Notes (Signed)
[facility] Note  04/19/2022     CHIEF COMPLAINT Patient presents for Retina Follow Up   HISTORY OF PRESENT ILLNESS: Adrian Neal is a 56 y.o. male who presents to the clinic today for:   HPI     Retina Follow Up   Patient presents with  Diabetic Retinopathy.  In both eyes.  Severity is moderate.  Duration of 12 months.  Since onset it is stable.  I, the attending physician,  performed the HPI with the patient and updated documentation appropriately.        Comments   Patient states vision the same OU. BS was 168 this am. Last A1c was 7.2, checked 5-6 months ago.       Last edited by [name], MD on 04/20/2022  2:33 AM.    Pt states no change in vision, pt had an appt at [facility] a few months ago, he states they did not recommend cataract sx at that time  Referring physician: Lonia Skinner, MD [address] [place],  [place] [address]  HISTORICAL INFORMATION:   Selected notes from the MEDICAL RECORD NUMBER Referred by Dr. [name] for DM exam LEE:  Ocular Hx-cataract OD, pseudo OS PMH-DM (type 1, A1C: 8.6, takes novolog), heart murmur, HLD, HTN, stroke    CURRENT MEDICATIONS: No current outpatient medications on file. (Ophthalmic Drugs)   No current facility-administered medications for this visit. (Ophthalmic Drugs)   Current Outpatient Medications (Other)  Medication Sig   atorvastatin (LIPITOR) 40 MG tablet TAKE 1 TABLET BY MOUTH ONCE DAILY AT  6  IN  THE  EVENING   carvedilol (COREG) 25 MG tablet Take 25 mg by mouth 2 (two) times daily.   chlorthalidone (HYGROTON) 25 MG tablet Take 25 mg by mouth daily.   clopidogrel (PLAVIX) 75 MG tablet TAKE 1 TABLET BY MOUTH ONCE DAILY **ASPIRIN  AND  PLAVIX  FOR  3  MONTHS  AFTER  THAT  *STOP  ASPIRIN*  TAKE  PLAVIX  ALONE   Continuous Blood Gluc Receiver (DEXCOM G6 RECEIVER) DEVI 1 Device by Does not apply route as directed.   Continuous Blood Gluc Sensor (DEXCOM  G6 SENSOR) MISC 3 Devices by Does not apply route as directed.   Continuous Blood Gluc Transmit (DEXCOM G6 TRANSMITTER) MISC Use to monitor blood sugar. Change every 90 days.   glucose blood (ONETOUCH VERIO) test strip 1 each by Other route 3 (three) times daily. Use as instructed   Insulin Infusion Pump (T:SLIM INSULIN PUMP) DEVI by Does not apply route.   insulin lispro (HUMALOG) 100 UNIT/ML injection Max daily dose of 100 units via pump DX E10.59 (vials)   lisinopril (ZESTRIL) 40 MG tablet Take 40 mg by mouth daily.   pantoprazole (PROTONIX) 40 MG tablet Take 1 tablet by mouth once daily   spironolactone (ALDACTONE) 25 MG tablet Take 0.5 tablets (12.5 mg total) by mouth daily.   No current facility-administered medications for this visit. (Other)   REVIEW OF SYSTEMS: ROS   Positive for: Neurological, Musculoskeletal, Endocrine, Eyes, Respiratory Negative for: Constitutional, Gastrointestinal, Skin, Genitourinary, HENT, Cardiovascular, Psychiatric, Allergic/Imm, Heme/Lymph Last edited by Jobe Marker, COT on 04/19/2022  8:33 AM.     ALLERGIES No Known Allergies  PAST MEDICAL HISTORY Past Medical History:  Diagnosis Date   Cataract    Mixed form OS   Chronic kidney disease    Diabetes mellitus without complication ([place])    diagnosed  at age 4   Eye problems    Heart murmur 1996   High cholesterol    patient denies but take preventative medicine   Hypertension    Hypertensive retinopathy    OU   Retinopathy due to secondary diabetes mellitus ([place])    PDR OU   Sleep apnea    on BiPAP   Stroke ([place]) 2016   Past Surgical History:  Procedure Laterality Date   ANTERIOR APPROACH HEMI HIP ARTHROPLASTY Left 01/01/2019   Procedure: ANTERIOR APPROACH total hip ARTHROPLASTY;  Surgeon: Rod Can, MD;  Location: [facility];  Service: Orthopedics;  Laterality: Left;   CATARACT EXTRACTION Right 1994   CATARACT EXTRACTION W/ INTRAOCULAR LENS IMPLANT  1994   EYE SURGERY Right 1991    vitrectomy   EYE SURGERY Right 1992   scar tissue removed from retina   EYE SURGERY Right 1994   Cat Sx   FAMILY HISTORY Family History  Problem Relation Age of Onset   Hypertension Mother    Hyperlipidemia Mother    Hyperlipidemia Father    Hypertension Father    Diabetes Father    Kidney disease Father        had a kidney transplant    Stroke Brother    Diabetes Brother    Pulmonary fibrosis Paternal Grandmother    Lung cancer Paternal Grandfather    Diabetes Daughter    Colon cancer Neg Hx    Esophageal cancer Neg Hx    Rectal cancer Neg Hx    Stomach cancer Neg Hx    SOCIAL HISTORY Social History   Tobacco Use   Smoking status: Former    Packs/day: 1.00    Years: 6.00    Total pack years: 6.00    Types: Cigarettes    Quit date: 04/30/1991    Years since quitting: 30.9   Smokeless tobacco: Never  Vaping Use   Vaping Use: Never used  Substance Use Topics   Alcohol use: No   Drug use: Never       OPHTHALMIC EXAM:  Base Eye Exam     Visual Acuity (Snellen - Linear)       Right Left   Dist cc 20/60 -2 20/40   Dist ph cc 20/50 -2 20/30 -1    Correction: Glasses         Tonometry (Tonopen, 8:43 AM)       Right Left   Pressure 10 12         Pupils       Dark Light Shape React APD   Right 3 2 Round Brisk None   Left 3 2 Round Brisk None         Visual Fields (Counting fingers)       Left Right    Full    Restrictions  Partial outer superior temporal deficiency         Extraocular Movement       Right Left    Full, Ortho Full, Ortho         Neuro/Psych     Oriented x3: Yes   Mood/Affect: Normal         Dilation     Both eyes: 1.0% Mydriacyl, 2.5% Phenylephrine @ 8:43 AM           Slit Lamp and Fundus Exam     Slit Lamp Exam       Right Left   Lids/Lashes dermatochalasis, mild MGD dermatochalasis, Meibomian gland dysfunction   Conjunctiva/Sclera  Mild Conjunctivochalasis Mild Conjunctivochalasis   Cornea fine  endopigment, focal band K at 3:00, well healed temporal cataract wounds, mild arcus, mild tear film debris Clear   Anterior Chamber Deep and quiet deep, narrow angles   Iris Round, poorly dilated, no NVI round, moderately dilated, no NVI, mild anterior bowing   Lens PC IOL in good position 2-3+Nuclear sclerosis with mild brunescence, 2-3+ Cortical cataract   Anterior Vitreous post vitrectomy, vitreous condensations vitreous syneresis         Fundus Exam       Right Left   Disc +cupping, mild Pallor, fibrosis at 4:30, peripapillary CR scarring at 9:00, Sharp rim Pink and Sharp, trace fibrosis, Compact   C/D Ratio 0.7 0.2   Macula Flat, Blunted foveal reflex, Retinal pigment epithelial mottling, Atrophy, focal peripapillary pigment clump nasal macula, No heme or edema Flat, blunted foveal reflex, mild RPE mottling, trace Epiretinal membrane, No heme or edema   Vessels attenuated, Tortuous, copper wiring attenuated, Tortuous   Periphery Attached, 360 PRP, No heme attached, 360 PRP to arcades, No heme           Refraction     Wearing Rx       Sphere Cylinder Axis Add   Right +2.25 +0.75 088 +2.50   Left +1.50 +0.75 092 +2.50    Type: PAL           IMAGING AND PROCEDURES  Imaging and Procedures for '@TODAY'$ @  OCT, Retina - OU - Both Eyes       Right Eye Quality was good. Central Foveal Thickness: 198. Progression has been stable. Findings include normal foveal contour, no IRF, no SRF, retinal drusen , epiretinal membrane, outer retinal atrophy (Patchy ORA, irregular lamination, no DME, mild ERM -- Stable from prior, interval improvement in vitreous opacities).   Left Eye Quality was good. Central Foveal Thickness: 267. Progression has been stable. Findings include normal foveal contour, no IRF, no SRF, epiretinal membrane, vitreomacular adhesion (Trace cystic changes, no DME ).   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 CME - cystoid macular edema. PED - pigment epithelial detachment. IRF - intraretinal fluid. SRF - subretinal fluid. EZ - ellipsoid zone. ERM - epiretinal membrane. ORA - outer retinal atrophy. ORT - outer retinal tubulation. SRHM - subretinal hyper-reflective material            ASSESSMENT/PLAN:    ICD-10-CM   1. 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 Essential hypertension  I10     3. Hypertensive retinopathy of both eyes  H35.033     4. Pseudophakia  Z96.1     5. Combined forms of age-related cataract of left eye  H25.812      1. DM1 w/ Proliferative diabetic retinopathy w/o DME, OU  - last A1c: 6.8 on 09.08.22  - history of extensive retinal work done in [place], [place] in 90s per pt -- including PPV w/ laser OD  - saw Dr. Zigmund Daniel once in 2013 and then pt lost insurance and has not had regular eye care since  - exam shows extensive PRP and regressed fibrosis/NV OD -- no significant edema  - good PRP in place OU  - FA (3.17.2021)  shows scattered MA, but no active NV  - OCT without diabetic macular edema, both eyes   - BCVA 20/50 OD (stable); 20/30 OS (decreased--suspect cataract progression)  - OD likely affected by retinal ischemia vs ischemic optic atrophy   - discussed findings and prognosis  - no intervention indicated at this time  - recommend monitoring for now  - f/u in 1 year, sooner prn -- DFE/OCT  2,3. Hypertensive retinopathy OU  - discussed importance of tight BP control  - monitor  4. Pseudophakia OD  - s/p CE/IOL OD by surgeon at [facility] [phone]  - doing well  - monitor   5. Mixed Cataract OS  - The symptoms of cataract, surgical options, and treatments and risks were discussed with patient.  - discussed diagnosis and  progression  - under the expert management of [facility] - clear from a retina standpoint to proceed with cataract surgery when pt and surgeon are ready    Ophthalmic Meds Ordered this visit:  No orders of the defined types were placed in this encounter.    Return in about 1 year (around 04/20/2023) for f/u PDR OU, DFE, OCT.  There are no Patient Instructions on file for this visit.   Explained the diagnoses, plan, and follow up with the patient and they expressed understanding.  Patient expressed understanding of the importance of proper follow up care.   This document serves as a record of services personally performed by Gardiner Sleeper, MD, PhD. It was created on their behalf by San Jetty. Owens Shark, OA an ophthalmic technician. The creation of this record is the provider's dictation and/or activities during the visit.    Electronically signed by: San Jetty. Owens Shark, [place] 12.21.2023 2:34 AM  Gardiner Sleeper, M.D., Ph.D. Diseases & Surgery of the Retina and Vitreous [facility]  I have reviewed the above documentation for accuracy and completeness, and I agree with the above. Gardiner Sleeper, M.D., Ph.D. 04/20/22 2:36 AM  Abbreviations: M myopia (nearsighted); A astigmatism; H hyperopia (farsighted); P presbyopia; Mrx spectacle prescription;  CTL contact lenses; OD right eye; OS left eye; OU both eyes  XT exotropia; ET esotropia; PEK punctate epithelial keratitis; PEE punctate epithelial erosions; DES dry eye syndrome; MGD meibomian gland dysfunction; ATs artificial tears; PFAT's preservative free artificial tears; [place] nuclear sclerotic cataract; PSC posterior subcapsular cataract; ERM epi-retinal membrane; PVD posterior vitreous detachment; RD retinal detachment; DM diabetes mellitus; DR diabetic retinopathy; NPDR non-proliferative diabetic retinopathy; PDR proliferative diabetic retinopathy; CSME clinically significant macular edema; DME diabetic macular  edema; dbh dot blot hemorrhages; CWS cotton wool spot; POAG primary open angle glaucoma; C/D cup-to-disc ratio; HVF humphrey visual field; GVF goldmann visual field; OCT optical coherence tomography; IOP intraocular pressure; BRVO Branch retinal vein occlusion; CRVO central retinal vein occlusion; CRAO central retinal artery occlusion; BRAO branch retinal artery occlusion; RT retinal tear; SB scleral buckle; PPV pars plana vitrectomy; VH Vitreous hemorrhage; PRP panretinal laser photocoagulation; IVK intravitreal kenalog; VMT vitreomacular traction; MH Macular hole;  NVD neovascularization of the disc; NVE neovascularization elsewhere; AREDS age related eye disease study; ARMD age related macular degeneration; POAG primary open angle glaucoma; EBMD epithelial/anterior basement membrane dystrophy; ACIOL anterior chamber intraocular lens; IOL intraocular lens; PCIOL posterior chamber intraocular lens; Phaco/IOL phacoemulsification with intraocular lens placement; [place] photorefractive keratectomy; LASIK laser assisted in situ keratomileusis; HTN hypertension; DM diabetes mellitus; COPD chronic obstructive pulmonary disease

## 2022-04-19 ENCOUNTER — Encounter (INDEPENDENT_AMBULATORY_CARE_PROVIDER_SITE_OTHER): Payer: Self-pay | Admitting: Ophthalmology

## 2022-04-19 ENCOUNTER — Ambulatory Visit (INDEPENDENT_AMBULATORY_CARE_PROVIDER_SITE_OTHER): Payer: PPO | Admitting: Ophthalmology

## 2022-04-19 DIAGNOSIS — H35033 Hypertensive retinopathy, bilateral: Secondary | ICD-10-CM

## 2022-04-19 DIAGNOSIS — I10 Essential (primary) hypertension: Secondary | ICD-10-CM | POA: Diagnosis not present

## 2022-04-19 DIAGNOSIS — H25812 Combined forms of age-related cataract, left eye: Secondary | ICD-10-CM

## 2022-04-19 DIAGNOSIS — Z961 Presence of intraocular lens: Secondary | ICD-10-CM | POA: Diagnosis not present

## 2022-04-19 DIAGNOSIS — E103593 Type 1 diabetes mellitus with proliferative diabetic retinopathy without macular edema, bilateral: Secondary | ICD-10-CM | POA: Diagnosis not present

## 2022-04-20 ENCOUNTER — Encounter (INDEPENDENT_AMBULATORY_CARE_PROVIDER_SITE_OTHER): Payer: Self-pay | Admitting: Ophthalmology

## 2022-04-27 ENCOUNTER — Other Ambulatory Visit: Payer: Self-pay | Admitting: Family Medicine

## 2022-04-27 DIAGNOSIS — Z8673 Personal history of transient ischemic attack (TIA), and cerebral infarction without residual deficits: Secondary | ICD-10-CM

## 2022-05-24 DIAGNOSIS — N184 Chronic kidney disease, stage 4 (severe): Secondary | ICD-10-CM | POA: Diagnosis not present

## 2022-05-24 DIAGNOSIS — E109 Type 1 diabetes mellitus without complications: Secondary | ICD-10-CM | POA: Diagnosis not present

## 2022-05-29 ENCOUNTER — Ambulatory Visit (INDEPENDENT_AMBULATORY_CARE_PROVIDER_SITE_OTHER): Payer: PPO | Admitting: Internal Medicine

## 2022-05-29 ENCOUNTER — Encounter: Payer: Self-pay | Admitting: Internal Medicine

## 2022-05-29 VITALS — BP 110/68 | HR 76 | Ht 67.0 in | Wt 196.0 lb

## 2022-05-29 DIAGNOSIS — E1022 Type 1 diabetes mellitus with diabetic chronic kidney disease: Secondary | ICD-10-CM | POA: Diagnosis not present

## 2022-05-29 DIAGNOSIS — E10319 Type 1 diabetes mellitus with unspecified diabetic retinopathy without macular edema: Secondary | ICD-10-CM | POA: Diagnosis not present

## 2022-05-29 DIAGNOSIS — N184 Chronic kidney disease, stage 4 (severe): Secondary | ICD-10-CM

## 2022-05-29 DIAGNOSIS — E1059 Type 1 diabetes mellitus with other circulatory complications: Secondary | ICD-10-CM | POA: Diagnosis not present

## 2022-05-29 LAB — POCT GLYCOSYLATED HEMOGLOBIN (HGB A1C): Hemoglobin A1C: 7.2 % — AB (ref 4.0–5.6)

## 2022-05-29 NOTE — Progress Notes (Signed)
Name: Adrian Neal  Age/ Sex: 57 y.o., male   MRN/ DOB: [id number], 1965/10/19     PCP: Adrian Maw, MD   Reason for Endocrinology Evaluation: Type 1 Diabetes Mellitus  Initial Endocrine Consultative Visit: 06/18/2018    PATIENT IDENTIFIER: [name] is a 57 y.o. male with a past medical history of .HTN,Hyperlipidemia and Hx of CVA  The patient has followed with Endocrinology clinic since 06/18/2018 for consultative assistance with management of his diabetes.  DIABETIC HISTORY:  Adrian Neal was diagnosed with T1DM at age 10, he has been on insulin pump since 2012. His hemoglobin A1c has ranged from 7.2% , peaking at 9.0%   In 11/2018 he switched to the T-Slim   SUBJECTIVE:   During the last visit (06/2021): A1c 7.2 %    Today (05/29/2022): Adrian Neal is here for a follow up on diabetes management and his new pump.  He checks his blood sugars multiple times daily, preprandial and bedtime. The patient has had hypoglycemic episodes since the last clinic visit, which typically occur 2 x /week - most often occuring during the day after meals. The patient is symptomatic with these episodes.  Is seeing nephrology with CKD IV  Denies nausea or vomiting or diarrhea    This patient with type 1 diabetes is treated with humalog (insulin pump). The clinical list was updated.     Current pump settings   Pump   T-Slim      Insulin type   HUMALOG    Basal rate       0000   1.1 u/h    0600 1.0u/h   1100 0.9      I:C ratio   0000-0600 1:9   0600-1100  1:6   1100-0000 1:8      Sensitivity      0000 30      AIT      0000  5      Goal       0000  110          Type & Model of Pump:T-Slim  Insulin Type: Currently using Humalog   PUMP STATISTICS:  1/18-1/31/2024 Average BG Dashboard : 177 Average daily Carbs 157 Average Total Daily Insulin:  75.3 u/day  Average Daily Basal:27.29 (36 %) Average Daily Bolus: 48.02 (64 %)   CONTINUOUS  GLUCOSE MONITORING RECORD INTERPRETATION    Dates of Recording: 1/18-1/31/2024  Sensor description:dexcom  Results statistics:   CGM use % of time 100  Average and SD 177/56  Time in range    59    %  % Time Above 180 30  % Time above 250 11  % Time Below target 0   Glycemic patterns summary: BG's optimal during the night and fluctuate during the day  Hyperglycemic episodes  postprandial   Hypoglycemic episodes occurred n/a  Overnight periods: optimal      Statin: yes ACE-I/ARB: yes      DIABETIC COMPLICATIONS: Microvascular complications:  CKD IV, 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  Denies: Neuropathy  Last eye exam: Completed 01/2022   Macrovascular complications:  CVA Denies: CAD, PVD       HISTORY:  Past Medical History:  Past Medical History:  Diagnosis Date   Cataract    Mixed form OS   Chronic kidney disease    Diabetes mellitus without complication ([place])    diagnosed at age 44   Eye problems    Heart murmur 1996  High cholesterol    patient denies but take preventative medicine   Hypertension    Hypertensive retinopathy    OU   Retinopathy due to secondary diabetes mellitus ([place])    PDR OU   Sleep apnea    on BiPAP   Stroke ([place]) 2016   Past Surgical History:  Past Surgical History:  Procedure Laterality Date   ANTERIOR APPROACH HEMI HIP ARTHROPLASTY Left 01/01/2019   Procedure: ANTERIOR APPROACH total hip ARTHROPLASTY;  Surgeon: Rod Can, MD;  Location: [facility];  Service: Orthopedics;  Laterality: Left;   CATARACT EXTRACTION Right 1994   CATARACT EXTRACTION W/ INTRAOCULAR LENS IMPLANT  1994   EYE SURGERY Right 1991   vitrectomy   EYE SURGERY Right 1992   scar tissue removed from retina   EYE SURGERY Right 1994   Cat Sx   Social History:  reports that he quit smoking about 31 years ago. His smoking use included cigarettes. He has a 6.00 pack-year smoking history. He has never used smokeless tobacco. He reports that he does not drink alcohol and  does not use drugs. Family History:  Family History  Problem Relation Age of Onset   Hypertension Mother    Hyperlipidemia Mother    Hyperlipidemia Father    Hypertension Father    Diabetes Father    Kidney disease Father        had a kidney transplant    Stroke Brother    Diabetes Brother    Pulmonary fibrosis Paternal Grandmother    Lung cancer Paternal Grandfather    Diabetes Daughter    Colon cancer Neg Hx    Esophageal cancer Neg Hx    Rectal cancer Neg Hx    Stomach cancer Neg Hx     HOME MEDICATIONS: Allergies as of 05/29/2022   No Known Allergies      Medication List        Accurate as of May 29, 2022 10:37 AM. If you have any questions, ask your nurse or doctor.          atorvastatin 40 MG tablet Commonly known as: LIPITOR TAKE 1 TABLET BY MOUTH ONCE DAILY AT  6  IN  THE  EVENING   carvedilol 25 MG tablet Commonly known as: COREG Take 25 mg by mouth 2 (two) times daily.   chlorthalidone 25 MG tablet Commonly known as: HYGROTON Take 25 mg by mouth daily.   clopidogrel 75 MG tablet Commonly known as: PLAVIX TAKE 1 TABLET BY MOUTH ONCE DAILY *ASPIRIN  AND  PLAVIX  FOR  3  MONTHS  AFTER  THAT  STOP  ASPIRIN  AND  TAKE  PLAVIX  ALONE   Dexcom G6 Receiver Devi 1 Device by Does not apply route as directed.   Dexcom G6 Sensor Misc 3 Devices by Does not apply route as directed.   Dexcom G6 Transmitter Misc Use to monitor blood sugar. Change every 90 days.   insulin lispro 100 UNIT/ML injection Commonly known as: HumaLOG Max daily dose of 100 units via pump DX E10.59 (vials)   lisinopril 40 MG tablet Commonly known as: ZESTRIL Take 40 mg by mouth daily.   OneTouch Verio test strip Generic drug: glucose blood 1 each by Other route 3 (three) times daily. Use as instructed   pantoprazole 40 MG tablet Commonly known as: PROTONIX Take 1 tablet by mouth once daily   spironolactone 25 MG tablet Commonly known as: ALDACTONE Take 0.5 tablets  (12.5 mg total) by mouth daily.  T:slim Insulin Pump Devi by Does not apply route.       PHYSICAL EXAM: VS: BP 110/68 (BP Location: Left Arm, Patient Position: Sitting, Cuff Size: Large)   Pulse 76   Ht '5\' 7"'$  (1.702 m)   Wt 196 lb (88.9 kg)   SpO2 96%   BMI 30.70 kg/m    EXAM: General: Pt appears well and is in NAD  Lungs: Clear with good BS bilat with no rales, rhonchi, or wheezes  Heart: Auscultation: RRR   Extremities:  BL LE: no pretibial edema   Mental Status: Judgment, insight: intact Orientation: oriented to time, place, and person Mood and affect: no depression, anxiety, or agitation   DM Foot Exam 05/29/2022  The skin of the feet is without sores or ulcerations, toe nails dystrophic The pedal pulses are 1+ on right and 1+ on left. The sensation is intact  to a screening 5.07, 10 gram monofilament bilaterally      DATA REVIEWED:  Lab Results  Component Value Date   HGBA1C 7.2 (A) 05/29/2022   HGBA1C 7.2 (A) 07/06/2021   HGBA1C 6.8 (A) 01/04/2021    Latest Reference Range & Units 03/05/22 08:27  Sodium 135 - 145 mEq/L 139  Potassium 3.5 - 5.1 mEq/L 4.7  Chloride 96 - 112 mEq/L 109  CO2 19 - 32 mEq/L 22  Glucose 70 - 99 mg/dL 98  BUN 6 - 23 mg/dL 56 (H)  Creatinine 0.40 - 1.50 mg/dL 2.47 (H)  Calcium 8.4 - 10.5 mg/dL 9.2  Alkaline Phosphatase 39 - 117 U/L 109  Albumin 3.5 - 5.2 g/dL 4.3  AST 0 - 37 U/L 19  ALT 0 - 53 U/L 27  Total Protein 6.0 - 8.3 g/dL 6.9  Total Bilirubin 0.2 - 1.2 mg/dL 0.5  GFR >60.00 mL/min 28.47 (L)      Latest Reference Range & Units 03/05/22 08:27  Direct LDL mg/dL 63.0    ASSESSMENT / PLAN / RECOMMENDATIONS:   1) Type 1 Diabetes Mellitus, Optimally controlled, With CKD IV, retinopathic and  macrovascular complications - Most recent A1c of 7.2 %. Goal A1c < 7.5 %.   - His A1c remains  stable  - In reviewing CGM/pump download, he has been noted with postprandial hyperglycemia  - Will adjust insulin to carb ratio   - Pt has been noted with occasional late bolus , he understands the importance of checking glucose before each meal  - Hypoglycemia overnight has resolved  - Pt states his PCP was going to refer him to podiatry, I offered to refer him today but he would like to wait at this time    MEDICATIONS:  Pump   T-Slim      Insulin type   HUMALOG    Basal rate       0000   1.1 u/h    0600 1.0u/h   1100 0.9      I:C ratio   0000 1:8   0600 1:5   1100 1:7      Sensitivity      0000 30      AIT      0000  5      Goal       0000  110      EDUCATION / INSTRUCTIONS: BG monitoring instructions: Patient is instructed to check his blood sugars 4 times a day, before meals and bedtime. Call [facility] if: BG persistently < 70  I reviewed the Rule of 15 for  the treatment of hypoglycemia in detail with the patient. Literature supplied.    F/U in 6 months      Signed electronically by: Mack Guise, MD  [facility] End[facility]  [address] [place], [place] [address] Phone: [phone] FAX: [phone]   CC: Adrian Neal, [place] [address] Phone: [phone]  Fax: [phone]  Return to Endocrinology clinic as below: Future Appointments  Date Time Provider [place]  09/02/2022  3:40 PM Adrian Maw, MD [facility]  10/10/2022 11:00 AM Germaine Pomfret, [place] [facility] None  04/18/2023  9:00 AM [name], MD [facility] None

## 2022-05-29 NOTE — Patient Instructions (Signed)

## 2022-06-05 ENCOUNTER — Telehealth: Payer: Self-pay

## 2022-06-05 ENCOUNTER — Other Ambulatory Visit: Payer: Self-pay | Admitting: Family Medicine

## 2022-06-05 DIAGNOSIS — I129 Hypertensive chronic kidney disease with stage 1 through stage 4 chronic kidney disease, or unspecified chronic kidney disease: Secondary | ICD-10-CM | POA: Diagnosis not present

## 2022-06-05 DIAGNOSIS — Z8673 Personal history of transient ischemic attack (TIA), and cerebral infarction without residual deficits: Secondary | ICD-10-CM | POA: Diagnosis not present

## 2022-06-05 DIAGNOSIS — E1022 Type 1 diabetes mellitus with diabetic chronic kidney disease: Secondary | ICD-10-CM | POA: Diagnosis not present

## 2022-06-05 DIAGNOSIS — N184 Chronic kidney disease, stage 4 (severe): Secondary | ICD-10-CM | POA: Diagnosis not present

## 2022-06-05 DIAGNOSIS — D631 Anemia in chronic kidney disease: Secondary | ICD-10-CM | POA: Diagnosis not present

## 2022-06-05 NOTE — Progress Notes (Signed)
Care Management & Coordination Services Pharmacy Team  Reason for Encounter: Hypertension  Contacted patient to discuss hypertension disease state. Spoke with patient on 06/05/2022     Current antihypertensive regimen:  Carvedilol 25 mg twice daily  Chlorthalidone 25 mg one tablet daily  Lisinopril 40 mg daily  Spironolactone 25 mg  tablet daily   Patient verbally confirms he is taking the above medications as directed. Yes  How often are you checking your Blood Pressure? daily  he checks his blood pressure in the middle of the day after taking his medication.  Current home BP readings:   Patient reports his blood pressure ranges around 130's/60's.   Wrist or arm cuff: Patient states he use a arm cuff.  Caffeine intake: Patient states he really does not have any caffeine intake.If he does is rarely when he drinks diet coke.  Salt intake: Patient denies any salt intake.   Any readings above 180/100? No  What recent interventions/DTPs have been made by any provider to improve Blood Pressure control since last CPP Visit: None ID  Any recent hospitalizations or ED visits since last visit with CPP? Yes  What diet changes have been made to improve Blood Pressure Control?  Patient denies any changes.  What exercise is being done to improve your Blood Pressure Control?  Patient reports he does not exercise, but states he walks a lot at work maybe around two hours a day in total.  Other Concerns: Patient states he had a appointment with his nephrologist today,and he informed him to start OTC iron supplement,but is unsure of the dose he should take.Patient is asking for the clinical pharmacist recommendation of the dose he should take and if any brand is better then the others.Notified Clinical pharmacist.   Per Clinical pharmacist, Unable to see his nephrology note to confirm what dose they wanted. I would recommend looking for Iron 325 mg (with 65 mg of elemental iron) and  take one daily.   Adherence Review: Is the patient currently on ACE/ARB medication? Yes Does the patient have >5 day gap between last estimated fill dates? No  Star Rating Drugs:  Medication:Atorvastatin 40 mg Last Fill: 05/16/2022 90 Day Supply at [facility]. Medication:Lisinopril 40 mg Last Fill: 04/30/2022 90 Day Supply at [facility].   Chart Updates: Recent office visits:  None ID  Recent consult visits:  05/29/2022 Dr. Kelton Pillar MD (Endocrinology) No medication Changes  noted, follow up in 6 months  04/19/2022 Dr. Coralyn Pear MD (ophthalmology) No Medication changes noted, No orders placed, follow up in 1 year  Hospital visits:  Medication Reconciliation was completed by comparing discharge summary, patient's EMR and Pharmacy list, and upon discussion with patient.  Admitted to the hospital on 12/20/20223 due to Dog bite. Discharge date was 04/17/2022. Discharged from [facility]?Medications Started at [facility] Discharge:?? -started AUGMENTIN 875-125 mg per tablet  Medication Changes at Hospital Discharge: -Changed None ID  Medications Discontinued at Hospital Discharge: -Stopped None ID  Medications that remain the same after Hospital Discharge:??  -All other medications will remain the same.    Medications: Outpatient Encounter Medications as of 06/05/2022  Medication Sig Note   atorvastatin (LIPITOR) 40 MG tablet TAKE 1 TABLET BY MOUTH ONCE DAILY AT  6  IN  THE  EVENING    carvedilol (COREG) 25 MG tablet Take 25 mg by mouth 2 (two) times daily.    chlorthalidone (HYGROTON) 25 MG tablet Take 25 mg by mouth daily.  09/20/2021: Prescribed by [name], MD   clopidogrel (PLAVIX) 75 MG tablet TAKE 1 TABLET BY MOUTH ONCE DAILY *ASPIRIN  AND  PLAVIX  FOR  3  MONTHS  AFTER  THAT  STOP  ASPIRIN  AND  TAKE  PLAVIX  ALONE    Continuous Blood Gluc Receiver (DEXCOM G6 RECEIVER) DEVI 1 Device by Does not apply route as directed.    Continuous  Blood Gluc Sensor (DEXCOM G6 SENSOR) MISC 3 Devices by Does not apply route as directed.    Continuous Blood Gluc Transmit (DEXCOM G6 TRANSMITTER) MISC Use to monitor blood sugar. Change every 90 days.    glucose blood (ONETOUCH VERIO) test strip 1 each by Other route 3 (three) times daily. Use as instructed    Insulin Infusion Pump (T:SLIM INSULIN PUMP) DEVI by Does not apply route.    insulin lispro (HUMALOG) 100 UNIT/ML injection Max daily dose of 100 units via pump DX E10.59 (vials)    lisinopril (ZESTRIL) 40 MG tablet Take 40 mg by mouth daily.    pantoprazole (PROTONIX) 40 MG tablet Take 1 tablet by mouth once daily    spironolactone (ALDACTONE) 25 MG tablet Take 0.5 tablets (12.5 mg total) by mouth daily.    No facility-administered encounter medications on file as of 06/05/2022.    Recent Office Vitals: BP Readings from Last 3 Encounters:  05/29/22 110/68  03/04/22 126/60  10/19/21 (!) 154/70   Pulse Readings from Last 3 Encounters:  05/29/22 76  03/04/22 76  10/19/21 88    Wt Readings from Last 3 Encounters:  05/29/22 196 lb (88.9 kg)  03/04/22 195 lb (88.5 kg)  10/19/21 191 lb (86.6 kg)     Kidney Function Lab Results  Component Value Date/Time   CREATININE 2.47 (H) 03/05/2022 08:27 AM   CREATININE 2.92 (H) 10/07/2021 11:30 PM   CREATININE 2.55 (H) 06/09/2020 03:58 PM   CREATININE 2.07 (H) 01/08/2019 04:38 PM   GFR 28.47 (L) 03/05/2022 08:27 AM   GFRNONAA 25 (L) 10/07/2021 11:30 PM   GFRAA 35 (L) 01/03/2019 07:28 AM       Latest Ref Rng & Units 03/05/2022    8:27 AM 10/07/2021   11:30 PM 08/28/2021    4:21 PM  BMP  Glucose 70 - 99 mg/dL 98  159  201   BUN 6 - 23 mg/dL 56  58  55   Creatinine 0.40 - 1.50 mg/dL 2.47  2.92  2.55   Sodium 135 - 145 mEq/L 139  133  137   Potassium 3.5 - 5.1 mEq/L 4.7  4.2  4.6   Chloride 96 - 112 mEq/L 109  109  108   CO2 19 - 32 mEq/L 22  16  20   $ Calcium 8.4 - 10.5 mg/dL 9.2  8.3  8.6      [facility] Clinical  Pharmacist Assistant [phone]

## 2022-06-07 ENCOUNTER — Encounter: Payer: Self-pay | Admitting: Family Medicine

## 2022-06-07 DIAGNOSIS — I10 Essential (primary) hypertension: Secondary | ICD-10-CM

## 2022-06-07 MED ORDER — CARVEDILOL 25 MG PO TABS: 25.0000 mg | ORAL_TABLET | Freq: Two times a day (BID) | ORAL | 1 refills | Status: DC

## 2022-06-07 NOTE — Telephone Encounter (Signed)
Last Cardiologist appointment was 2022 at [facility] patient would like referral to local cardiologist for evaluation. Please advise.

## 2022-06-21 ENCOUNTER — Encounter: Payer: Self-pay | Admitting: Internal Medicine

## 2022-06-24 DIAGNOSIS — E109 Type 1 diabetes mellitus without complications: Secondary | ICD-10-CM | POA: Diagnosis not present

## 2022-07-06 ENCOUNTER — Other Ambulatory Visit: Payer: Self-pay | Admitting: Family Medicine

## 2022-07-06 DIAGNOSIS — K219 Gastro-esophageal reflux disease without esophagitis: Secondary | ICD-10-CM

## 2022-07-22 ENCOUNTER — Other Ambulatory Visit: Payer: Self-pay | Admitting: Family Medicine

## 2022-07-22 DIAGNOSIS — Z8673 Personal history of transient ischemic attack (TIA), and cerebral infarction without residual deficits: Secondary | ICD-10-CM

## 2022-07-24 DIAGNOSIS — E109 Type 1 diabetes mellitus without complications: Secondary | ICD-10-CM | POA: Diagnosis not present

## 2022-07-25 ENCOUNTER — Ambulatory Visit: Payer: PPO | Attending: Cardiology | Admitting: Cardiology

## 2022-07-25 ENCOUNTER — Encounter: Payer: Self-pay | Admitting: Cardiology

## 2022-07-25 VITALS — BP 130/56 | HR 83 | Ht 67.0 in | Wt 194.0 lb

## 2022-07-25 DIAGNOSIS — R0609 Other forms of dyspnea: Secondary | ICD-10-CM

## 2022-07-25 DIAGNOSIS — E1040 Type 1 diabetes mellitus with diabetic neuropathy, unspecified: Secondary | ICD-10-CM

## 2022-07-25 DIAGNOSIS — Z8673 Personal history of transient ischemic attack (TIA), and cerebral infarction without residual deficits: Secondary | ICD-10-CM

## 2022-07-25 DIAGNOSIS — I10 Essential (primary) hypertension: Secondary | ICD-10-CM

## 2022-07-25 DIAGNOSIS — R0789 Other chest pain: Secondary | ICD-10-CM | POA: Diagnosis not present

## 2022-07-25 DIAGNOSIS — N1832 Chronic kidney disease, stage 3b: Secondary | ICD-10-CM | POA: Diagnosis not present

## 2022-07-25 NOTE — Patient Instructions (Signed)
Medication Instructions:  Your physician recommends that you continue on your current medications as directed. Please refer to the Current Medication list given to you today.  *If you need a refill on your cardiac medications before your next appointment, please call your pharmacy*   Lab Work: None Ordered If you have labs (blood work) drawn today and your tests are completely normal, you will receive your results only by: [place] (if you have MyChart) OR A paper copy in the mail If you have any lab test that is abnormal or we need to change your treatment, we will call you to review the results.   Testing/Procedures: Your physician has requested that you have a lexiscan myoview. For further information please visit [email or web address]. Please follow instruction sheet, as given.  The test will take approximately 3 to 4 hours to complete; you may bring reading material.  If someone comes with you to your appointment, they will need to remain in the main lobby due to limited space in the testing area.   How to prepare for your Myocardial Perfusion Test: Do not eat or drink 3 hours prior to your test, except you may have water. Do not consume products containing caffeine (regular or decaffeinated) 12 hours prior to your test. (ex: coffee, chocolate, sodas, tea). Do bring a list of your current medications with you.  If not listed below, you may take your medications as normal. Do wear comfortable clothes (no dresses or overalls) and walking shoes, tennis shoes preferred (No heels or open toe shoes are allowed). Do NOT wear cologne, perfume, aftershave, or lotions (deodorant is allowed). If these instructions are not followed, your test will have to be rescheduled.    Your physician has requested that you have an echocardiogram. Echocardiography is a painless test that uses sound waves to create images of your heart. It provides your doctor with information about the size and shape of  your heart and how well your heart's chambers and valves are working. This procedure takes approximately one hour. There are no restrictions for this procedure. Please do NOT wear cologne, perfume, aftershave, or lotions (deodorant is allowed). Please arrive 15 minutes prior to your appointment time.    Your physician has requested that you have a carotid duplex. This test is an ultrasound of the carotid arteries in your neck. It looks at blood flow through these arteries that supply the brain with blood. Allow one hour for this exam. There are no restrictions or special instructions.   Follow-Up: At [facility], you and your health needs are our priority.  As part of our continuing mission to provide you with exceptional heart care, we have created designated Provider Care Teams.  These Care Teams include your primary Cardiologist (physician) and Advanced Practice Providers (APPs -  Physician Assistants and Nurse Practitioners) who all work together to provide you with the care you need, when you need it.  We recommend signing up for the patient portal called "MyChart".  Sign up information is provided on this After Visit Summary.  MyChart is used to connect with patients for Virtual Visits (Telemedicine).  Patients are able to view lab/test results, encounter notes, upcoming appointments, etc.  Non-urgent messages can be sent to your provider as well.   To learn more about what you can do with MyChart, go to [email or web address].    Your next appointment:   3 month(s)  The format for your next appointment:   In Person  Provider:   Jenne Campus,  MD    Other Instructions NA

## 2022-07-25 NOTE — Progress Notes (Signed)
Cardiology Consultation:    Date:  07/25/2022   ID:  Adrian Neal, DOB [birth year], MRN [id number]  PCP:  [name], MD  Cardiologist:  Jenne Campus, MD   Referring MD: [name]   No chief complaint on file. Established patient  History of Present Illness:    Adrian Neal is a 57 y.o. male who is being seen today for the evaluation of atypical chest pain multiple risk factors for coronary artery disease at the request of [name],*.  Past medical history significant for type 1 diabetes has been diabetic since age of 73.  Apparently quite well-managed but at the same time he does have some complication of longstanding diabetes which include diabetic retinopathy as well as diabetic nephropathy and diabetic neuropathy.  Additional problem include essential hypertension dyslipidemia.  He was referred to [place] because he need to be reestablished as a patient in cardiology practice.  Previously he thinks significant 2.  He said he is doing most of the time well.  He described to have some atypical chest pain on the right side of his chest that typically he is taking care of his grandson.  He does not exercise on regular basis but he walks a lot.  He get tired quite easily with some fatigue tiredness and shortness of breath.  Denies having any typical exertional chest pain.  He does not smoke, never did does have family history of premature cardiac death apparently his father died having myocardial infarction in the past when he was in his 25s he was also diabetic.  He is not on any special diet except of course he is watching for diabetes.  He does have dyslipidemia he is on cholesterol medication  Past Medical History:  Diagnosis Date   Cataract    Mixed form OS   Chronic kidney disease    Diabetes mellitus without complication ([place])    diagnosed at age 30   Eye problems    Heart murmur 1996   High cholesterol    patient denies but take  preventative medicine   Hypertension    Hypertensive retinopathy    OU   Retinopathy due to secondary diabetes mellitus ([place])    PDR OU   Sleep apnea    on BiPAP   Stroke ([place]) 2016    Past Surgical History:  Procedure Laterality Date   ANTERIOR APPROACH HEMI HIP ARTHROPLASTY Left 01/01/2019   Procedure: ANTERIOR APPROACH total hip ARTHROPLASTY;  Surgeon: Rod Can, MD;  Location: [facility];  Service: Orthopedics;  Laterality: Left;   CATARACT EXTRACTION Right 1994   CATARACT EXTRACTION W/ INTRAOCULAR LENS IMPLANT  1994   EYE SURGERY Right 1991   vitrectomy   EYE SURGERY Right 1992   scar tissue removed from retina   EYE SURGERY Right 1994   Cat Sx    Current Medications: Current Meds  Medication Sig   atorvastatin (LIPITOR) 40 MG tablet TAKE 1 TABLET BY MOUTH ONCE DAILY AT  6  IN  THE  EVENING   carvedilol (COREG) 25 MG tablet Take 1 tablet (25 mg total) by mouth 2 (two) times daily.   chlorthalidone (HYGROTON) 25 MG tablet Take 25 mg by mouth daily.   clopidogrel (PLAVIX) 75 MG tablet TAKE 1 TABLET BY MOUTH ONCE DAILY *ASPIRIN  AND  PLAVIX  FOR  3  MONTHS  AFTER  THAT  STOP  THE  ASPIRIN  AND  TAKE  PLAVIX  ALONE   Continuous  Blood Gluc Receiver (DEXCOM G6 RECEIVER) DEVI 1 Device by Does not apply route as directed.   Continuous Blood Gluc Sensor (DEXCOM G6 SENSOR) MISC 3 Devices by Does not apply route as directed.   Continuous Blood Gluc Transmit (DEXCOM G6 TRANSMITTER) MISC Use to monitor blood sugar. Change every 90 days.   Fe Bisgly-Succ-C-Thre-B12-FA (IRON-150 PO) Take 1 tablet by mouth daily.   glucose blood (ONETOUCH VERIO) test strip 1 each by Other route 3 (three) times daily. Use as instructed   Insulin Infusion Pump (T:SLIM INSULIN PUMP) DEVI by Does not apply route.   insulin lispro (HUMALOG) 100 UNIT/ML injection Max daily dose of 100 units via pump DX E10.59 (vials)   lisinopril (ZESTRIL) 40 MG tablet Take 40 mg by mouth daily.   pantoprazole (PROTONIX) 40 MG  tablet Take 1 tablet by mouth once daily   spironolactone (ALDACTONE) 25 MG tablet Take 0.5 tablets (12.5 mg total) by mouth daily.     Allergies:   Patient has no known allergies.   Social History   Socioeconomic History   Marital status: Married    Spouse name: Not on file   Number of children: 3   Years of education: Not on file   Highest education level: Not on file  Occupational History   Occupation: Disabled  Tobacco Use   Smoking status: Former    Packs/day: 1.00    Years: 6.00    Additional pack years: 0.00    Total pack years: 6.00    Types: Cigarettes    Quit date: 04/30/1991    Years since quitting: 31.2   Smokeless tobacco: Never  Vaping Use   Vaping Use: Never used  Substance and Sexual Activity   Alcohol use: No   Drug use: Never   Sexual activity: Not on file  Other Topics Concern   Not on file  Social History Narrative   Right handed   Lives wife and kids in a two story home   Social Determinants of Health   Financial Resource Strain: Low Risk  (09/20/2021)   Overall Financial Resource Strain (CARDIA)    Difficulty of Paying Living Expenses: Not hard at all  Food Insecurity: No Food Insecurity (09/19/2021)   Hunger Vital Sign    Worried About Running Out of Food in the Last Year: Never true    [place] in the Last Year: Never true  Transportation Needs: No Transportation Needs (09/19/2021)   PRAPARE - Hydrologist (Medical): No    Lack of Transportation (Non-Medical): No  Physical Activity: Insufficiently Active (09/19/2021)   Exercise Vital Sign    Days of Exercise per Week: 2 days    Minutes of Exercise per Session: 30 min  Stress: No Stress Concern Present (09/19/2021)   [place]    Feeling of Stress : Not at all  Social Connections: [place] (09/19/2021)   Social Connection and Isolation Panel [NHANES]    Frequency of Communication  with Friends and Family: Three times a week    Frequency of Social Gatherings with Friends and Family: Three times a week    Attends Religious Services: More than 4 times per year    Active Member of Clubs or Organizations: Yes    Attends Music therapist: More than 4 times per year    Marital Status: Married     Family History: The patient's family history includes Diabetes in his  brother, daughter, and father; Hyperlipidemia in his father and mother; Hypertension in his father and mother; Kidney disease in his father; Lung cancer in his paternal grandfather; Pulmonary fibrosis in his paternal grandmother; Stroke in his brother. There is no history of Colon cancer, Esophageal cancer, Rectal cancer, or Stomach cancer. ROS:   Please see the history of present illness.    All 14 point review of systems negative except as described per history of present illness.  EKGs/Labs/Other Studies Reviewed:    The following studies were reviewed today:   EKG:  EKG is  ordered today.  The ekg ordered today demonstrates normal sinus rhythm, normal P interval, normal QS complex duration fulgent no ST segment changes  Recent Labs: 10/07/2021: Magnesium 1.4 03/05/2022: ALT 27; BUN 56; Creatinine, Ser 2.47; Hemoglobin 12.3; Platelets 102.0; Potassium 4.7; Sodium 139  Recent Lipid Panel    Component Value Date/Time   CHOL 107 10/28/2018 0839   TRIG 58.0 10/28/2018 0839   HDL 35.10 (L) 10/28/2018 0839   CHOLHDL 3 10/28/2018 0839   VLDL 11.6 10/28/2018 0839   LDLCALC 60 10/28/2018 0839   LDLDIRECT 63.0 03/05/2022 0827    Physical Exam:    VS:  BP (!) 130/56 (BP Location: Left Arm, Patient Position: Sitting)   Pulse 83   Ht 5\' 7"  (1.702 m)   Wt 194 lb (88 kg)   SpO2 98%   BMI 30.38 kg/m     Wt Readings from Last 3 Encounters:  07/25/22 194 lb (88 kg)  05/29/22 196 lb (88.9 kg)  03/04/22 195 lb (88.5 kg)     GEN:  Well nourished, well developed in no acute distress HEENT:  Normal NECK: No JVD; No carotid bruits LYMPHATICS: No lymphadenopathy CARDIAC: RRR, no murmurs, no rubs, no gallops RESPIRATORY:  Clear to auscultation without rales, wheezing or rhonchi  ABDOMEN: Soft, non-tender, non-distended MUSCULOSKELETAL:  No edema; No deformity  SKIN: Warm and dry NEUROLOGIC:  Alert and oriented x 3 PSYCHIATRIC:  Normal affect   ASSESSMENT:    1. Atypical chest pain   2. Stage 3b chronic kidney disease ([place])   3. History of CVA (cerebrovascular accident)   4. Type 1 diabetes mellitus with diabetic neuropathy, unspecified ([place])   5. Essential hypertension    PLAN:    In order of problems listed above:  Atypical chest pain this gentleman with multiple risk factors for coronary artery disease.  Clearly he need to be evaluated for presence of obstructive disease I will schedule him to have Lexiscan to see if he get any significant obstructive disease he is already on antiplatelet therapy which I will continue he is also on statin. Dyspnea: Exertion: I will schedule him to have echocardiogram to assess left ventricle ejection fraction. Remote history of CVA.  I will schedule him to have carotic ultrasounds make sure he does not have any significant coronary artery disease.  He did have ABIs performed recently which were normal. Dyslipidemia he is on high intensity statin with his last LDL of 60 and HDL 35.  Will continue present management   Medication Adjustments/Labs and Tests Ordered: Current medicines are reviewed at length with the patient today.  Concerns regarding medicines are outlined above.  No orders of the defined types were placed in this encounter.  No orders of the defined types were placed in this encounter.   Signed, Park Liter, MD, [facility] 07/25/2022 10:18 AM    [place]

## 2022-07-26 ENCOUNTER — Telehealth (HOSPITAL_COMMUNITY): Payer: Self-pay | Admitting: *Deleted

## 2022-07-26 NOTE — Telephone Encounter (Signed)
Left message on voicemail per DPR in reference to upcoming appointment scheduled on 07/31/2022 at 8:00 with detailed instructions given per Myocardial Perfusion Study Information Sheet for the test. LM to arrive 15 minutes early, and that it is imperative to arrive on time for appointment to keep from having the test rescheduled. If you need to cancel or reschedule your appointment, please call the office within 24 hours of your appointment. Failure to do so may result in a cancellation of your appointment, and a $50 no show fee. Phone number given for call back for any questions.

## 2022-07-31 ENCOUNTER — Ambulatory Visit (HOSPITAL_COMMUNITY): Payer: PPO | Attending: Cardiology

## 2022-07-31 DIAGNOSIS — R0789 Other chest pain: Secondary | ICD-10-CM

## 2022-07-31 LAB — MYOCARDIAL PERFUSION IMAGING
LV dias vol: 77 mL (ref 62–150)
LV sys vol: 26 mL
Nuc Stress EF: 67 %
Peak HR: 90 {beats}/min
Rest HR: 73 {beats}/min
Rest Nuclear Isotope Dose: 10.9 mCi
SDS: 0
SRS: 0
SSS: 0
ST Depression (mm): 0 mm
Stress Nuclear Isotope Dose: 30.8 mCi
TID: 1.11

## 2022-07-31 MED ORDER — AMINOPHYLLINE 25 MG/ML IV SOLN
75.0000 mg | Freq: Once | INTRAVENOUS | Status: AC
  Administered 2022-07-31: 75 mg via INTRAVENOUS

## 2022-07-31 MED ORDER — TECHNETIUM TC 99M TETROFOSMIN IV KIT
30.8000 | PACK | Freq: Once | INTRAVENOUS | Status: AC | PRN
  Administered 2022-07-31: 30.8 via INTRAVENOUS

## 2022-07-31 MED ORDER — REGADENOSON 0.4 MG/5ML IV SOLN
0.4000 mg | Freq: Once | INTRAVENOUS | Status: AC
  Administered 2022-07-31: 0.4 mg via INTRAVENOUS

## 2022-07-31 MED ORDER — TECHNETIUM TC 99M TETROFOSMIN IV KIT
10.9000 | PACK | Freq: Once | INTRAVENOUS | Status: AC | PRN
  Administered 2022-07-31: 10.9 via INTRAVENOUS

## 2022-08-02 ENCOUNTER — Telehealth: Payer: Self-pay

## 2022-08-02 NOTE — Telephone Encounter (Signed)
Left message on My Chart with normal results per Dr. Krasowski's note. Routed to PCP. 

## 2022-08-06 ENCOUNTER — Telehealth: Payer: Self-pay

## 2022-08-06 NOTE — Telephone Encounter (Signed)
Pt viewed normal stress test results per Dr. Vanetta Shawl note on My Chart. Routed to PCP.

## 2022-08-09 ENCOUNTER — Telehealth: Payer: Self-pay | Admitting: Family Medicine

## 2022-08-09 NOTE — Telephone Encounter (Signed)
Called patient to schedule Medicare Annual Wellness Visit (AWV). Left message for patient to call back and schedule Medicare Annual Wellness Visit (AWV).  Last date of AWV: 09/19/21  Please schedule an appointment at any time with [facility] or [employer].  If any questions, please contact me at [phone].  Thank you ,  Rudell Cobb AWV direct phone # [phone]

## 2022-08-12 ENCOUNTER — Encounter: Payer: Self-pay | Admitting: *Deleted

## 2022-08-13 ENCOUNTER — Ambulatory Visit (HOSPITAL_BASED_OUTPATIENT_CLINIC_OR_DEPARTMENT_OTHER)
Admission: RE | Admit: 2022-08-13 | Discharge: 2022-08-13 | Disposition: A | Discharge status: Home / Self Care | Payer: PPO | Source: Ambulatory Visit | Attending: Cardiology | Admitting: Cardiology

## 2022-08-13 DIAGNOSIS — R0609 Other forms of dyspnea: Secondary | ICD-10-CM | POA: Diagnosis not present

## 2022-08-13 DIAGNOSIS — Z8673 Personal history of transient ischemic attack (TIA), and cerebral infarction without residual deficits: Secondary | ICD-10-CM | POA: Insufficient documentation

## 2022-08-14 ENCOUNTER — Ambulatory Visit (HOSPITAL_BASED_OUTPATIENT_CLINIC_OR_DEPARTMENT_OTHER)
Admission: RE | Admit: 2022-08-14 | Discharge: 2022-08-14 | Disposition: A | Discharge status: Home / Self Care | Payer: PPO | Source: Ambulatory Visit | Attending: Cardiology | Admitting: Cardiology

## 2022-08-14 DIAGNOSIS — R0609 Other forms of dyspnea: Secondary | ICD-10-CM

## 2022-08-14 LAB — ECHOCARDIOGRAM COMPLETE
Area-P 1/2: 2.9 cm2
S' Lateral: 2.6 cm

## 2022-08-15 ENCOUNTER — Telehealth: Payer: Self-pay

## 2022-08-15 DIAGNOSIS — E109 Type 1 diabetes mellitus without complications: Secondary | ICD-10-CM | POA: Diagnosis not present

## 2022-08-15 NOTE — Telephone Encounter (Signed)
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  Medical therapy

## 2022-08-15 NOTE — Telephone Encounter (Signed)
Patient notified through my chart.

## 2022-08-21 ENCOUNTER — Telehealth: Payer: Self-pay

## 2022-08-21 NOTE — Telephone Encounter (Signed)
Patient notified through my chart.

## 2022-08-21 NOTE — Telephone Encounter (Signed)
-----   Message from Georgeanna Lea, MD sent at 08/16/2022 12:01 PM EDT ----- Echocardiogram showed preserved left ventricle ejection fraction, overall looks good

## 2022-08-27 DIAGNOSIS — E1040 Type 1 diabetes mellitus with diabetic neuropathy, unspecified: Secondary | ICD-10-CM | POA: Diagnosis not present

## 2022-08-27 DIAGNOSIS — E1059 Type 1 diabetes mellitus with other circulatory complications: Secondary | ICD-10-CM | POA: Diagnosis not present

## 2022-09-02 ENCOUNTER — Ambulatory Visit (INDEPENDENT_AMBULATORY_CARE_PROVIDER_SITE_OTHER): Payer: PPO | Admitting: Family Medicine

## 2022-09-02 ENCOUNTER — Encounter: Payer: Self-pay | Admitting: Family Medicine

## 2022-09-02 VITALS — BP 124/66 | HR 82 | Temp 98.2°F | Ht 67.0 in | Wt 199.0 lb

## 2022-09-02 DIAGNOSIS — S46819A Strain of other muscles, fascia and tendons at shoulder and upper arm level, unspecified arm, initial encounter: Secondary | ICD-10-CM

## 2022-09-02 DIAGNOSIS — I10 Essential (primary) hypertension: Secondary | ICD-10-CM | POA: Diagnosis not present

## 2022-09-02 MED ORDER — METHOCARBAMOL 500 MG PO TABS: 500.0000 mg | ORAL_TABLET | Freq: Three times a day (TID) | ORAL | 0 refills | Status: DC | PRN

## 2022-09-02 MED ORDER — CARVEDILOL 25 MG PO TABS: 25.0000 mg | ORAL_TABLET | Freq: Two times a day (BID) | ORAL | 1 refills | Status: DC

## 2022-09-02 NOTE — Progress Notes (Signed)
Established Patient Office Visit   Subjective:  Patient ID: Adrian Neal, male    DOB: 22-Jul-1965  Age: 57 y.o. MRN: [id number]  Chief Complaint  Patient presents with   Medical Management of Chronic Issues    6 month follow up, concerns about new back pains x 2-3 weeks does not seems to be getting better.     HPI Encounter Diagnoses  Name Primary?   Strain of trapezius muscle, unspecified laterality, initial encounter Yes   Essential hypertension    2 to 3-week history of pain in the middle part of his back just below the shoulder blades.  Pain is bilateral and he experiences discomfort when he pulls with his outstretched hands.  Has been volunteering at [employer] by cooking and cleaning.  Blood pressure is well-controlled with carvedilol and chlorthalidone.   Review of Systems  Constitutional: Negative.   HENT: Negative.    Eyes:  Negative for blurred vision, discharge and redness.  Respiratory: Negative.    Cardiovascular: Negative.   Gastrointestinal:  Negative for abdominal pain, nausea and vomiting.  Genitourinary: Negative.  Negative for dysuria, hematuria and urgency.  Musculoskeletal:  Positive for back pain and myalgias.  Skin:  Negative for rash.  Neurological:  Negative for tingling, loss of consciousness and weakness.  Endo/Heme/Allergies:  Negative for polydipsia.     Current Outpatient Medications:    atorvastatin (LIPITOR) 40 MG tablet, TAKE 1 TABLET BY MOUTH ONCE DAILY AT  6  IN  THE  EVENING, Disp: 90 tablet, Rfl: 2   chlorthalidone (HYGROTON) 25 MG tablet, Take 25 mg by mouth daily., Disp: , Rfl:    clopidogrel (PLAVIX) 75 MG tablet, TAKE 1 TABLET BY MOUTH ONCE DAILY *ASPIRIN  AND  PLAVIX  FOR  3  MONTHS  AFTER  THAT  STOP  THE  ASPIRIN  AND  TAKE  PLAVIX  ALONE, Disp: 90 tablet, Rfl: 0   Continuous Blood Gluc Receiver (DEXCOM G6 RECEIVER) DEVI, 1 Device by Does not apply route as directed., Disp: 1 Device, Rfl: 0   Continuous Blood Gluc Sensor  (DEXCOM G6 SENSOR) MISC, 3 Devices by Does not apply route as directed., Disp: 3 each, Rfl: 6   Continuous Blood Gluc Transmit (DEXCOM G6 TRANSMITTER) MISC, Use to monitor blood sugar. Change every 90 days., Disp: 1 each, Rfl: 2   glucose blood (ONETOUCH VERIO) test strip, 1 each by Other route 3 (three) times daily. Use as instructed, Disp: 300 each, Rfl: 3   Insulin Infusion Pump (T:SLIM INSULIN PUMP) DEVI, by Does not apply route., Disp: , Rfl:    insulin lispro (HUMALOG) 100 UNIT/ML injection, Max daily dose of 100 units via pump DX E10.59 (vials), Disp: 90 mL, Rfl: 4   lisinopril (ZESTRIL) 40 MG tablet, Take 40 mg by mouth daily., Disp: , Rfl:    methocarbamol (ROBAXIN) 500 MG tablet, Take 1 tablet (500 mg total) by mouth every 8 (eight) hours as needed for muscle spasms., Disp: 40 tablet, Rfl: 0   pantoprazole (PROTONIX) 40 MG tablet, Take 1 tablet by mouth once daily, Disp: 90 tablet, Rfl: 0   spironolactone (ALDACTONE) 25 MG tablet, Take 0.5 tablets (12.5 mg total) by mouth daily., Disp: 15 tablet, Rfl: 11   carvedilol (COREG) 25 MG tablet, Take 1 tablet (25 mg total) by mouth 2 (two) times daily., Disp: 180 tablet, Rfl: 1   Fe Bisgly-Succ-C-Thre-B12-FA (IRON-150 PO), Take 1 tablet by mouth daily. (Patient not taking: Reported on 09/02/2022), Disp: , Rfl:  Objective:     BP 124/66 (BP Location: Left Arm, Patient Position: Sitting, Cuff Size: Large)   Pulse 82   Temp 98.2 F (36.8 C) (Temporal)   Ht 5\' 7"  (1.702 m)   Wt 199 lb (90.3 kg)   SpO2 99%   BMI 31.17 kg/m    Physical Exam Constitutional:      General: He is not in acute distress.    Appearance: Normal appearance. He is not ill-appearing, toxic-appearing or diaphoretic.  HENT:     Head: Normocephalic and atraumatic.     Right Ear: External ear normal.     Left Ear: External ear normal.  Eyes:     General: No scleral icterus.       Right eye: No discharge.        Left eye: No discharge.     Extraocular Movements:  Extraocular movements intact.     Conjunctiva/sclera: Conjunctivae normal.  Pulmonary:     Effort: Pulmonary effort is normal. No respiratory distress.  Abdominal:     Tenderness: There is no right CVA tenderness or left CVA tenderness.  Musculoskeletal:     Thoracic back: No tenderness or bony tenderness. Normal range of motion.     Lumbar back: No spasms, tenderness or bony tenderness. Normal range of motion.  Skin:    General: Skin is warm and dry.  Neurological:     Mental Status: He is alert and oriented to person, place, and time.  Psychiatric:        Mood and Affect: Mood normal.        Behavior: Behavior normal.      No results found for any visits on 09/02/22.    The ASCVD Risk score (Arnett DK, et al., 2019) failed to calculate for the following reasons:   The patient has a prior MI or stroke diagnosis    Assessment & Plan:   Strain of trapezius muscle, unspecified laterality, initial encounter -     Methocarbamol; Take 1 tablet (500 mg total) by mouth every 8 (eight) hours as needed for muscle spasms.  Dispense: 40 tablet; Refill: 0  Essential hypertension -     Carvedilol; Take 1 tablet (25 mg total) by mouth 2 (two) times daily.  Dispense: 180 tablet; Refill: 1 -     Basic metabolic panel -     Urinalysis, Routine w reflex microscopic    Return Return if not improving over the next month.  Avoid lifting over 10 pounds and anythingover shoulder.    Mliss Sax, MD

## 2022-09-03 LAB — URINALYSIS, ROUTINE W REFLEX MICROSCOPIC
Bilirubin Urine: NEGATIVE
Hgb urine dipstick: NEGATIVE
Ketones, ur: NEGATIVE
Leukocytes,Ua: NEGATIVE
Nitrite: NEGATIVE
RBC / HPF: NONE SEEN (ref 0–?)
Specific Gravity, Urine: 1.01 (ref 1.000–1.030)
Total Protein, Urine: NEGATIVE
Urine Glucose: 250 — AB
Urobilinogen, UA: 0.2 (ref 0.0–1.0)
WBC, UA: NONE SEEN (ref 0–?)
pH: 6 (ref 5.0–8.0)

## 2022-09-03 LAB — BASIC METABOLIC PANEL
BUN: 60 mg/dL — ABNORMAL HIGH (ref 6–23)
CO2: 19 mEq/L (ref 19–32)
Calcium: 8.5 mg/dL (ref 8.4–10.5)
Chloride: 106 mEq/L (ref 96–112)
Creatinine, Ser: 2.52 mg/dL — ABNORMAL HIGH (ref 0.40–1.50)
GFR: 27.7 mL/min — ABNORMAL LOW (ref >60.00)
Glucose, Bld: 292 mg/dL — ABNORMAL HIGH (ref 70–99)
Potassium: 4.9 mEq/L (ref 3.5–5.1)
Sodium: 134 mEq/L — ABNORMAL LOW (ref 135–145)

## 2022-09-24 DIAGNOSIS — R42 Dizziness and giddiness: Secondary | ICD-10-CM | POA: Diagnosis not present

## 2022-09-24 DIAGNOSIS — E1065 Type 1 diabetes mellitus with hyperglycemia: Secondary | ICD-10-CM | POA: Diagnosis not present

## 2022-09-25 DIAGNOSIS — N189 Chronic kidney disease, unspecified: Secondary | ICD-10-CM | POA: Diagnosis not present

## 2022-09-25 DIAGNOSIS — R7401 Elevation of levels of liver transaminase levels: Secondary | ICD-10-CM | POA: Diagnosis not present

## 2022-09-25 DIAGNOSIS — Z79899 Other long term (current) drug therapy: Secondary | ICD-10-CM | POA: Diagnosis not present

## 2022-09-25 DIAGNOSIS — E871 Hypo-osmolality and hyponatremia: Secondary | ICD-10-CM | POA: Diagnosis not present

## 2022-09-25 DIAGNOSIS — Z87891 Personal history of nicotine dependence: Secondary | ICD-10-CM | POA: Diagnosis not present

## 2022-09-25 DIAGNOSIS — I129 Hypertensive chronic kidney disease with stage 1 through stage 4 chronic kidney disease, or unspecified chronic kidney disease: Secondary | ICD-10-CM | POA: Diagnosis not present

## 2022-09-25 DIAGNOSIS — Z5986 Financial insecurity: Secondary | ICD-10-CM | POA: Diagnosis not present

## 2022-09-25 DIAGNOSIS — E785 Hyperlipidemia, unspecified: Secondary | ICD-10-CM | POA: Diagnosis not present

## 2022-09-25 DIAGNOSIS — E872 Acidosis, unspecified: Secondary | ICD-10-CM | POA: Diagnosis not present

## 2022-09-25 DIAGNOSIS — R42 Dizziness and giddiness: Secondary | ICD-10-CM | POA: Diagnosis not present

## 2022-09-25 DIAGNOSIS — Z794 Long term (current) use of insulin: Secondary | ICD-10-CM | POA: Diagnosis not present

## 2022-09-25 DIAGNOSIS — R945 Abnormal results of liver function studies: Secondary | ICD-10-CM | POA: Diagnosis not present

## 2022-09-25 DIAGNOSIS — I63039 Cerebral infarction due to thrombosis of unspecified carotid artery: Secondary | ICD-10-CM | POA: Diagnosis not present

## 2022-09-25 DIAGNOSIS — E1022 Type 1 diabetes mellitus with diabetic chronic kidney disease: Secondary | ICD-10-CM | POA: Diagnosis not present

## 2022-09-25 DIAGNOSIS — E109 Type 1 diabetes mellitus without complications: Secondary | ICD-10-CM | POA: Diagnosis not present

## 2022-09-25 DIAGNOSIS — N179 Acute kidney failure, unspecified: Secondary | ICD-10-CM | POA: Diagnosis not present

## 2022-09-25 DIAGNOSIS — R748 Abnormal levels of other serum enzymes: Secondary | ICD-10-CM | POA: Diagnosis not present

## 2022-09-25 DIAGNOSIS — D696 Thrombocytopenia, unspecified: Secondary | ICD-10-CM | POA: Diagnosis not present

## 2022-09-25 DIAGNOSIS — Z6831 Body mass index (BMI) 31.0-31.9, adult: Secondary | ICD-10-CM | POA: Diagnosis not present

## 2022-09-25 DIAGNOSIS — A020 Salmonella enteritis: Secondary | ICD-10-CM | POA: Diagnosis not present

## 2022-09-25 DIAGNOSIS — Z7902 Long term (current) use of antithrombotics/antiplatelets: Secondary | ICD-10-CM | POA: Diagnosis not present

## 2022-09-25 DIAGNOSIS — D631 Anemia in chronic kidney disease: Secondary | ICD-10-CM | POA: Diagnosis not present

## 2022-09-25 DIAGNOSIS — I69398 Other sequelae of cerebral infarction: Secondary | ICD-10-CM | POA: Diagnosis not present

## 2022-09-25 DIAGNOSIS — N1832 Chronic kidney disease, stage 3b: Secondary | ICD-10-CM | POA: Diagnosis not present

## 2022-09-25 DIAGNOSIS — Z1152 Encounter for screening for COVID-19: Secondary | ICD-10-CM | POA: Diagnosis not present

## 2022-09-25 DIAGNOSIS — Z9641 Presence of insulin pump (external) (internal): Secondary | ICD-10-CM | POA: Diagnosis not present

## 2022-09-25 DIAGNOSIS — E10319 Type 1 diabetes mellitus with unspecified diabetic retinopathy without macular edema: Secondary | ICD-10-CM | POA: Diagnosis not present

## 2022-09-25 DIAGNOSIS — E1065 Type 1 diabetes mellitus with hyperglycemia: Secondary | ICD-10-CM | POA: Diagnosis not present

## 2022-09-25 DIAGNOSIS — R630 Anorexia: Secondary | ICD-10-CM | POA: Diagnosis not present

## 2022-09-26 DIAGNOSIS — E109 Type 1 diabetes mellitus without complications: Secondary | ICD-10-CM | POA: Diagnosis not present

## 2022-09-30 ENCOUNTER — Telehealth: Payer: Self-pay

## 2022-09-30 NOTE — Transitions of Care (Post Inpatient/ED Visit) (Signed)
09/30/2022  Name: Adrian Neal MRN: [id number] DOB: [birth year]  Today's TOC FU Call Status: Today's TOC FU Call Status:: Successful TOC FU Call Competed TOC FU Call Complete Date: 09/30/22  Transition Care Management Follow-up Telephone Call Date of Discharge: 09/29/22 Discharge Facility: Other ([facility]) Name of Other ([facility]) Discharge Facility: [facility] Type of Discharge: Inpatient Admission Primary Inpatient Discharge Diagnosis:: Acute Kidney Injury How have you been since you were released from the hospital?:  (Patient notes he feels well and his CBG readings are good) Any questions or concerns?: No  Items Reviewed: Did you receive and understand the discharge instructions provided?: Yes Medications obtained,verified, and reconciled?: Yes (Medications Reviewed) Any new allergies since your discharge?: No Dietary orders reviewed?: Yes Type of Diet Ordered:: carb controlled Do you have support at home?: Yes People in Home: spouse Name of Support/Comfort Primary Source: Marchelle Folks  Medications Reviewed Today: Medications Reviewed Today     Reviewed by Jodelle Gross, RN (Case Manager) on 09/30/22 at 1303  Med List Status: <None>   Medication Order Taking? Sig Documenting Provider Last Dose Status Informant  atorvastatin (LIPITOR) 40 MG tablet [id number]  TAKE 1 TABLET BY MOUTH ONCE DAILY AT  6  IN  THE  Melburn Popper, Talmadge Coventry, MD  Active            Med Note Electa Sniff, [facility]   Mon Sep 30, 2022 12:59 PM) Not taking until hospital follow up with PCP  carvedilol (COREG) 25 MG tablet [id number] Yes Take 1 tablet (25 mg total) by mouth 2 (two) times daily. Mliss Sax, MD Taking Active   chlorthalidone (HYGROTON) 25 MG tablet [id number]  Take 25 mg by mouth daily. [provider]  Active            Med Note Electa Sniff [facility]   Mon Sep 30, 2022  1:02 PM) Patient not taking until HFU with PCP  clopidogrel (PLAVIX) 75 MG tablet [id number] Yes TAKE 1  TABLET BY MOUTH ONCE DAILY *ASPIRIN  AND  PLAVIX  FOR  3  MONTHS  AFTER  THAT  STOP  THE  ASPIRIN  AND  TAKE  PLAVIX  ALONE Mliss Sax, MD Taking Active   Continuous Blood Gluc Receiver (DEXCOM G6 RECEIVER) DEVI [id number] Yes 1 Device by Does not apply route as directed. Shamleffer, Konrad Dolores, MD Taking Active Self  Continuous Blood Gluc Sensor (DEXCOM G6 SENSOR) MISC [id number] Yes 3 Devices by Does not apply route as directed. Shamleffer, Konrad Dolores, MD Taking Active Self  Continuous Blood Gluc Transmit (DEXCOM G6 TRANSMITTER) MISC [id number]  Use to monitor blood sugar. Change every 90 days. Shamleffer, Konrad Dolores, MD  Active   Fe Bisgly-Succ-C-Thre-B12-FA (IRON-150 PO) [id number] No Take 1 tablet by mouth daily.  Patient not taking: Reported on 09/02/2022   [provider] Not Taking Active   glucose blood (ONETOUCH VERIO) test strip [id number]  1 each by Other route 3 (three) times daily. Use as instructed Shamleffer, Konrad Dolores, MD  Active   Insulin Infusion Pump (T:SLIM INSULIN PUMP) DEVI [id number] Yes by Does not apply route. [provider] Taking Active   insulin lispro (HUMALOG) 100 UNIT/ML injection [id number] Yes Max daily dose of 100 units via pump DX E10.59 (vials) Carlus Pavlov, MD Taking Active Self  lisinopril (ZESTRIL) 40 MG tablet [id number]  Take 40 mg by mouth daily. [provider]  Active            Med Note Electa Sniff, [facility]  Mon Sep 30, 2022  1:00 PM) Patient not taking until hospital follow up with PCP  methocarbamol (ROBAXIN) 500 MG tablet [id number] No Take 1 tablet (500 mg total) by mouth every 8 (eight) hours as needed for muscle spasms. Mliss Sax, MD Unknown Active   pantoprazole (PROTONIX) 40 MG tablet [id number]  Take 1 tablet by mouth once daily Mliss Sax, MD  Active   spironolactone (ALDACTONE) 25 MG tablet [id number]  Take 0.5 tablets (12.5 mg total) by mouth daily. Loyola Mast, MD   Expired 09/02/22 2359            Med Note Electa Sniff [facility]   Mon Sep 30, 2022  1:01 PM) Patient not taking until hospital follow up with PCP.            Home Care and Equipment/Supplies: Were Home Health Services Ordered?: No Any new equipment or medical supplies ordered?: No  Functional Questionnaire: Do you need assistance with bathing/showering or dressing?: No Do you need assistance with meal preparation?: No Do you need assistance with eating?: No Do you have difficulty maintaining continence: No Do you need assistance with getting out of bed/getting out of a chair/moving?: No Do you have difficulty managing or taking your medications?: No  Follow up appointments reviewed: PCP Follow-up appointment confirmed?: Yes Date of PCP follow-up appointment?: 10/01/22 Follow-up Provider: Dr. Doreene Burke Specialist [facility] Follow-up appointment confirmed?: NA Do you need transportation to your follow-up appointment?: No Do you understand care options if your condition(s) worsen?: Yes-patient verbalized understanding   TOC Interventions Today    Flowsheet Row Most Recent Value  TOC Interventions   TOC Interventions Discussed/Reviewed TOC Interventions Discussed, TOC Interventions Reviewed, Arranged PCP follow up within 7 days/Care Guide scheduled      Jodelle Gross, RN, BSN, CCM Care Management Coordinator [facility] Phone: [phone]/Fax: [phone]

## 2022-10-01 ENCOUNTER — Encounter: Payer: Self-pay | Admitting: Family Medicine

## 2022-10-01 ENCOUNTER — Ambulatory Visit (INDEPENDENT_AMBULATORY_CARE_PROVIDER_SITE_OTHER): Payer: PPO | Admitting: Family Medicine

## 2022-10-01 ENCOUNTER — Other Ambulatory Visit: Payer: Self-pay | Admitting: Family Medicine

## 2022-10-01 VITALS — BP 118/68 | HR 83

## 2022-10-01 DIAGNOSIS — R7401 Elevation of levels of liver transaminase levels: Secondary | ICD-10-CM

## 2022-10-01 DIAGNOSIS — N179 Acute kidney failure, unspecified: Secondary | ICD-10-CM

## 2022-10-01 DIAGNOSIS — I10 Essential (primary) hypertension: Secondary | ICD-10-CM

## 2022-10-01 DIAGNOSIS — K219 Gastro-esophageal reflux disease without esophagitis: Secondary | ICD-10-CM

## 2022-10-01 DIAGNOSIS — Z09 Encounter for follow-up examination after completed treatment for conditions other than malignant neoplasm: Secondary | ICD-10-CM

## 2022-10-01 LAB — COMPREHENSIVE METABOLIC PANEL
ALT: 153 U/L — ABNORMAL HIGH (ref 0–53)
AST: 80 U/L — ABNORMAL HIGH (ref 0–37)
Albumin: 3.6 g/dL (ref 3.5–5.2)
Alkaline Phosphatase: 154 U/L — ABNORMAL HIGH (ref 39–117)
BUN: 46 mg/dL — ABNORMAL HIGH (ref 6–23)
CO2: 24 mEq/L (ref 19–32)
Calcium: 8.7 mg/dL (ref 8.4–10.5)
Chloride: 103 mEq/L (ref 96–112)
Creatinine, Ser: 2.67 mg/dL — ABNORMAL HIGH (ref 0.40–1.50)
GFR: 25.83 mL/min — ABNORMAL LOW (ref >60.00)
Glucose, Bld: 191 mg/dL — ABNORMAL HIGH (ref 70–99)
Potassium: 4.8 mEq/L (ref 3.5–5.1)
Sodium: 136 mEq/L (ref 135–145)
Total Bilirubin: 0.8 mg/dL (ref 0.2–1.2)
Total Protein: 6.4 g/dL (ref 6.0–8.3)

## 2022-10-01 NOTE — Progress Notes (Signed)
Established Patient Office Visit   Subjective:  Patient ID: Adrian Neal, male    DOB: 09-02-1965  Age: 57 y.o. MRN: [id number]  Chief Complaint  Patient presents with   Hospitalization Follow-up    Hospital follow up, patient would like liver enzymes rechecked. Patient not fasting.     HPI Encounter Diagnoses  Name Primary?   Hospital discharge follow-up Yes   Elevated transaminase level    AKI (acute kidney injury) (HCC)    Essential hypertension    For follow-up status post recent hospitalization for acute diarrheal illness with elevated glucose, AKI and transaminases.  He said that he had been diagnosed with Salmonella.  Testing for hep a, B and C were all negative.  He does not drink alcohol.  On discharge 2 days ago GFR had returned to 27 which is closer to baseline for him.  ALT 162 and AST 98.  It was not diagnosed with ketoacidosis.  Ketones were mildly elevated which seems to be normal for him.  He is feeling much better.  Blood pressure is low.  He is only taking carvedilol.  All of his other blood pressure medicines are currently held.   Review of Systems  Constitutional: Negative.   HENT: Negative.    Eyes:  Negative for blurred vision, discharge and redness.  Respiratory: Negative.    Cardiovascular: Negative.   Gastrointestinal:  Negative for abdominal pain and diarrhea.  Genitourinary: Negative.   Musculoskeletal: Negative.  Negative for myalgias.  Skin:  Negative for rash.  Neurological:  Negative for tingling, loss of consciousness and weakness.  Endo/Heme/Allergies:  Negative for polydipsia.     Current Outpatient Medications:    carvedilol (COREG) 25 MG tablet, Take 1 tablet (25 mg total) by mouth 2 (two) times daily., Disp: 180 tablet, Rfl: 1   clopidogrel (PLAVIX) 75 MG tablet, TAKE 1 TABLET BY MOUTH ONCE DAILY *ASPIRIN  AND  PLAVIX  FOR  3  MONTHS  AFTER  THAT  STOP  THE  ASPIRIN  AND  TAKE  PLAVIX  ALONE, Disp: 90 tablet, Rfl: 0   Continuous  Blood Gluc Receiver (DEXCOM G6 RECEIVER) DEVI, 1 Device by Does not apply route as directed., Disp: 1 Device, Rfl: 0   Continuous Blood Gluc Sensor (DEXCOM G6 SENSOR) MISC, 3 Devices by Does not apply route as directed., Disp: 3 each, Rfl: 6   Continuous Blood Gluc Transmit (DEXCOM G6 TRANSMITTER) MISC, Use to monitor blood sugar. Change every 90 days., Disp: 1 each, Rfl: 2   glucose blood (ONETOUCH VERIO) test strip, 1 each by Other route 3 (three) times daily. Use as instructed, Disp: 300 each, Rfl: 3   Insulin Infusion Pump (T:SLIM INSULIN PUMP) DEVI, by Does not apply route., Disp: , Rfl:    insulin lispro (HUMALOG) 100 UNIT/ML injection, Max daily dose of 100 units via pump DX E10.59 (vials), Disp: 90 mL, Rfl: 4   pantoprazole (PROTONIX) 40 MG tablet, Take 1 tablet by mouth once daily, Disp: 90 tablet, Rfl: 0   atorvastatin (LIPITOR) 40 MG tablet, TAKE 1 TABLET BY MOUTH ONCE DAILY AT  6  IN  THE  EVENING (Patient not taking: Reported on 10/01/2022), Disp: 90 tablet, Rfl: 2   chlorthalidone (HYGROTON) 25 MG tablet, Take 25 mg by mouth daily. (Patient not taking: Reported on 10/01/2022), Disp: , Rfl:    Fe Bisgly-Succ-C-Thre-B12-FA (IRON-150 PO), Take 1 tablet by mouth daily. (Patient not taking: Reported on 09/02/2022), Disp: , Rfl:    lisinopril (  ZESTRIL) 40 MG tablet, Take 40 mg by mouth daily. (Patient not taking: Reported on 10/01/2022), Disp: , Rfl:    methocarbamol (ROBAXIN) 500 MG tablet, Take 1 tablet (500 mg total) by mouth every 8 (eight) hours as needed for muscle spasms. (Patient not taking: Reported on 10/01/2022), Disp: 40 tablet, Rfl: 0   spironolactone (ALDACTONE) 25 MG tablet, Take 0.5 tablets (12.5 mg total) by mouth daily., Disp: 15 tablet, Rfl: 11   Objective:     BP 118/68   Pulse 83   SpO2 96%    Physical Exam Constitutional:      General: He is not in acute distress.    Appearance: Normal appearance. He is not ill-appearing, toxic-appearing or diaphoretic.  HENT:     Head:  Normocephalic and atraumatic.     Right Ear: External ear normal.     Left Ear: External ear normal.     Mouth/Throat:     Mouth: Mucous membranes are moist.     Pharynx: Oropharynx is clear. No oropharyngeal exudate or posterior oropharyngeal erythema.  Eyes:     General: No scleral icterus.       Right eye: No discharge.        Left eye: No discharge.     Extraocular Movements: Extraocular movements intact.     Conjunctiva/sclera: Conjunctivae normal.  Cardiovascular:     Rate and Rhythm: Normal rate and regular rhythm.  Pulmonary:     Effort: Pulmonary effort is normal. No respiratory distress.     Breath sounds: Normal breath sounds.  Abdominal:     General: Bowel sounds are normal.  Skin:    General: Skin is warm and dry.  Neurological:     Mental Status: He is alert and oriented to person, place, and time.  Psychiatric:        Mood and Affect: Mood normal.        Behavior: Behavior normal.      No results found for any visits on 10/01/22.    The ASCVD Risk score (Arnett DK, et al., 2019) failed to calculate for the following reasons:   The patient has a prior MI or stroke diagnosis    Assessment & Plan:   Hospital discharge follow-up -     Ketones, qualitative -     CBC  Elevated transaminase level -     Comprehensive metabolic panel  AKI (acute kidney injury) (HCC) -     Comprehensive metabolic panel  Essential hypertension -     CBC    Return in about 4 weeks (around 10/29/2022).  Continue hold of lisinopril, chlorthalidone and Aldactone.  Follow-up with me in 4 weeks.  Has follow-up with nephrology in the near future.  Mliss Sax, MD

## 2022-10-11 IMAGING — CT CT HEAD W/O CM
3 series · 15 of 47 positions shown, 18 images · non-contrast
Comparison: CT head and MRI brain both 01/19/2015.

CLINICAL DATA: Dizziness and weakness for the past 2 days.



[Series 2: head wo · axial · 0.45mm/px · z∈[-175,-30]mm · 9 of 35 slices shown, 12 images]
[im 3/35  brain]
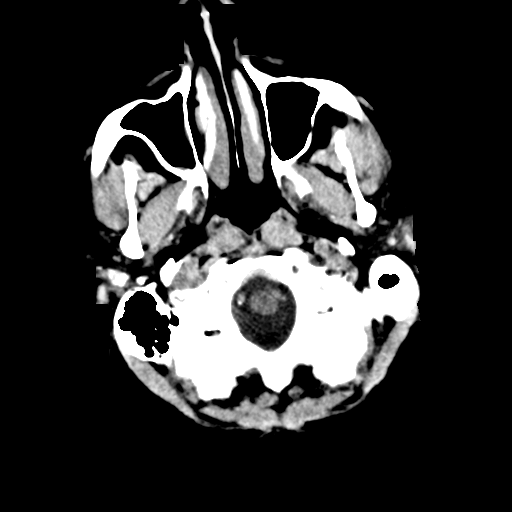
[im 3/35  bone]
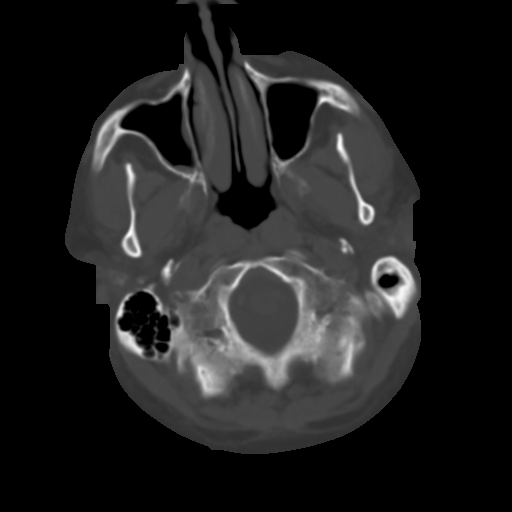
[im 6/35  brain]
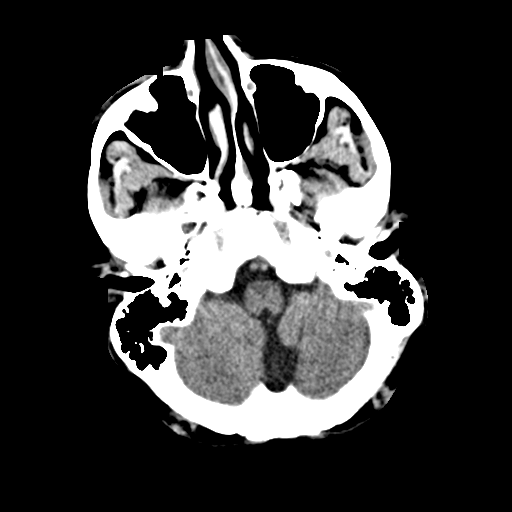
[im 10/35  brain]
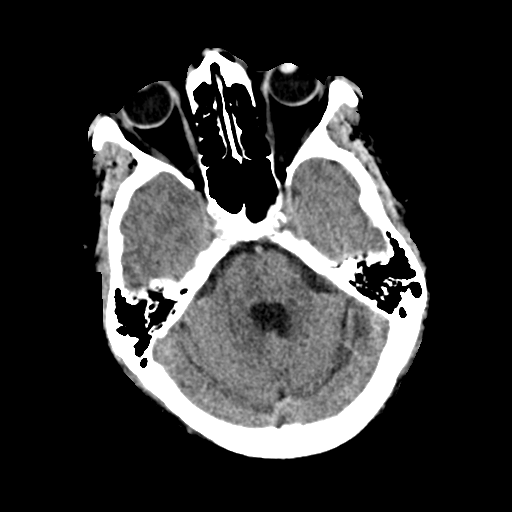
[im 13/35  brain]
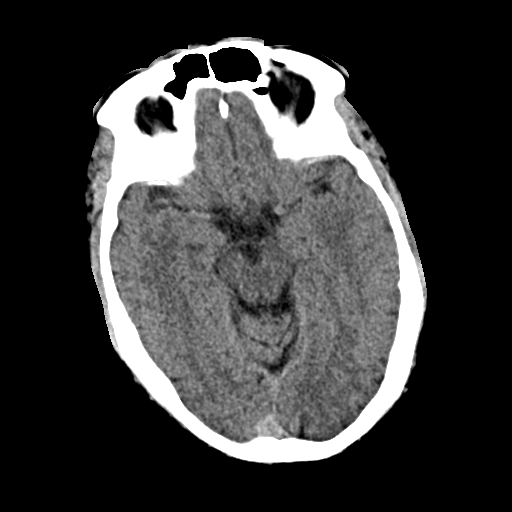
[im 18/35  brain]
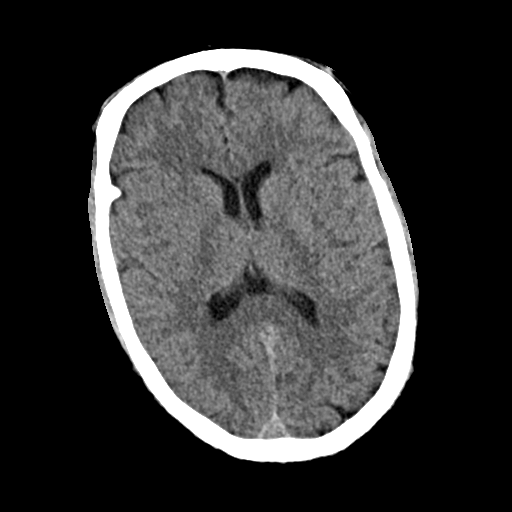
[im 18/35  bone]
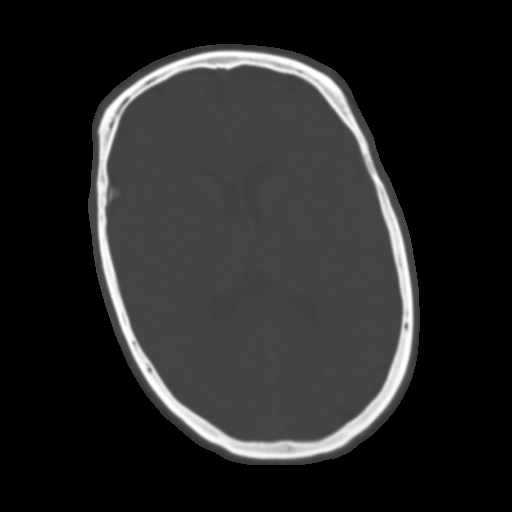
[im 22/35  brain]
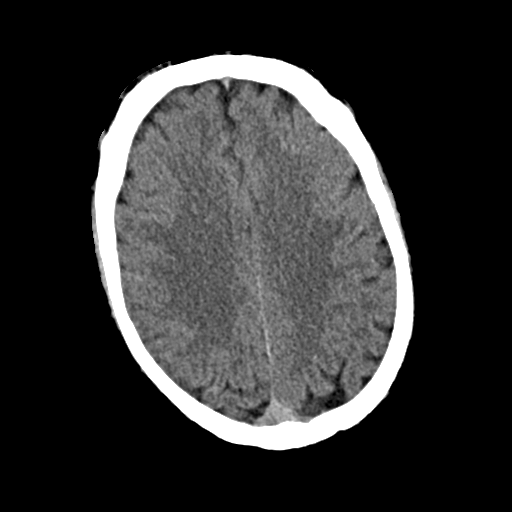
[im 25/35  brain]
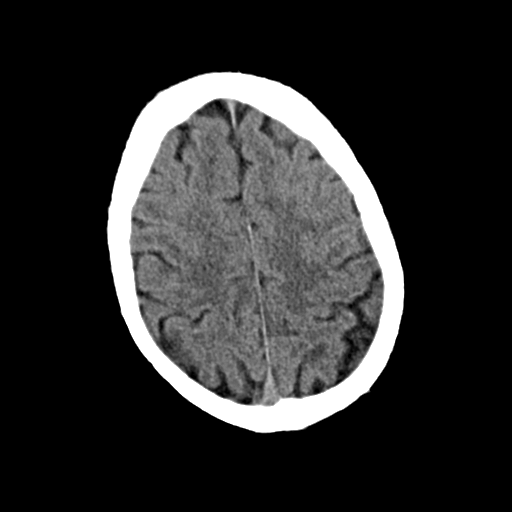
[im 29/35  brain]
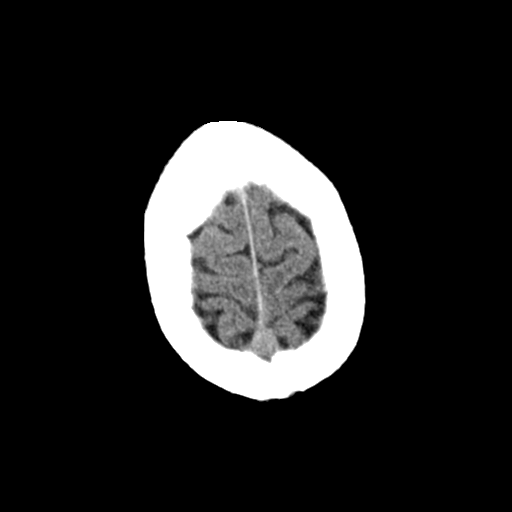
[im 32/35  brain]
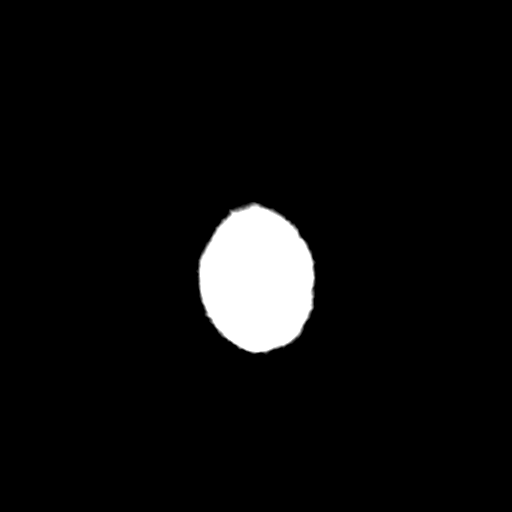
[im 32/35  bone]
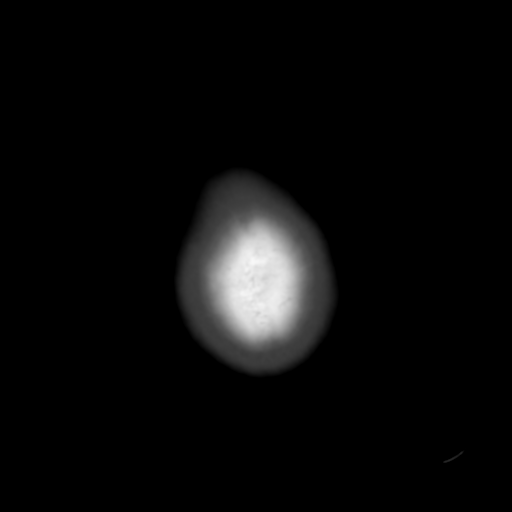

[Series 4: coronal soft · coronal · 0.34mm/px · 3 of 74 slices shown]
[im 25/74  brain]
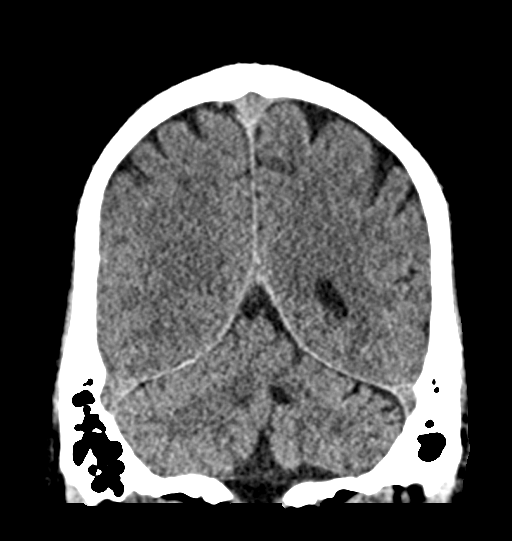
[im 33/74  brain]
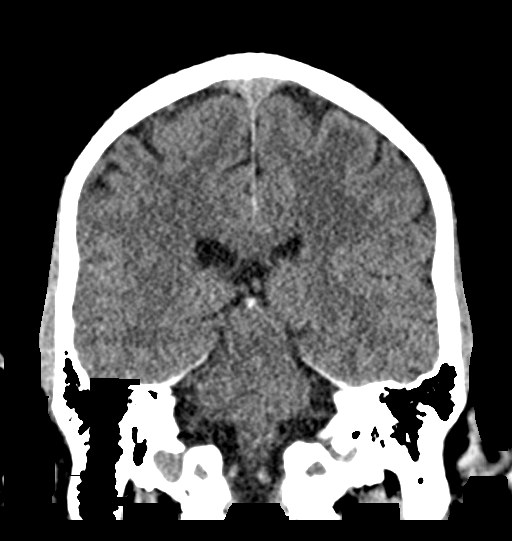
[im 41/74  brain]
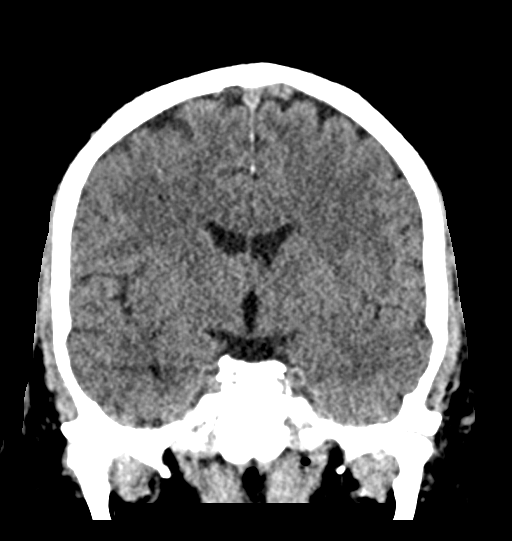

[Series 5: sag soft · sagittal · 0.36mm/px · 3 of 59 slices shown]
[im 20/59  brain]
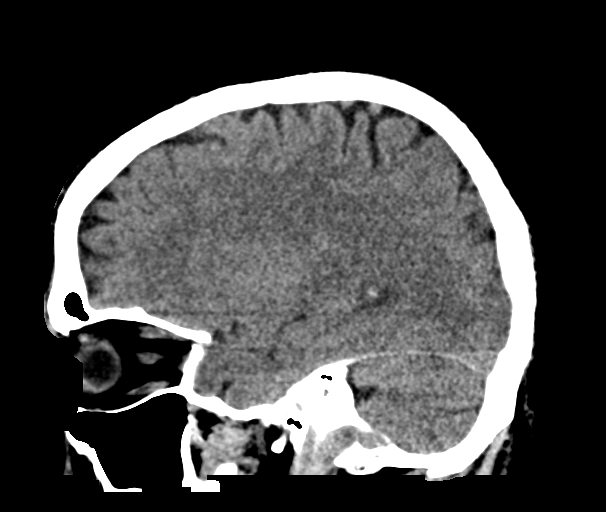
[im 30/59  brain]
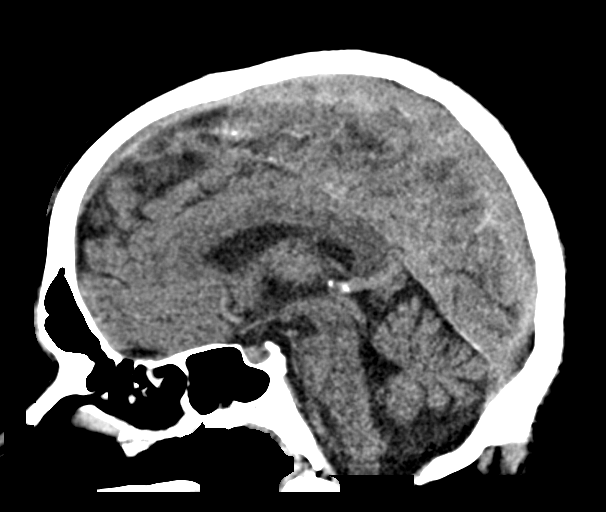
[im 39/59  brain]
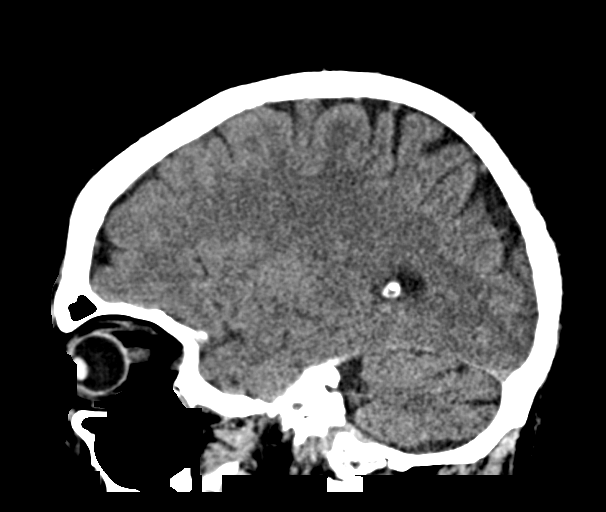

[15 of 47 positions shown; findings below may reference images not displayed]

FINDINGS: Brain: There are early changes of cerebral atrophy and small-vessel
disease.

The ventricles are normal in size and position. There is a small
chronic lacunar infarct in the medial aspect of the left cerebellar
peduncle and a small chronic left pontine lacunar infarct.

The cerebellum, right-sided brainstem are unremarkable. No asymmetry
is seen concerning for acute infarct, hemorrhage or mass in the
parenchyma. The basal cisterns are clear.

There is a 5 x 8 mm lateral right frontal extra-axial calcified
meningioma not seen previously. This has a chronic appearance.

Vascular: The carotid siphons and distal vertebral arteries are
heavily calcified. There are no hyperdense central vessels.

Skull: The calvarium, skull base and orbits are intact. No skull
lesion is seen.

Sinuses/Orbits: Old right lens extraction. There is mild membrane
thickening in the paranasal sinuses without fluid level, chronic
deviation of the nasal septum to the left. There is patchy fluid in
the lower left mastoid air cells not seen previously. The right
mastoids and both middle ear cavities are clear.

Other: None.
IMPRESSION: 1. No acute intracranial CT findings.
2. Vascular calcifications and chronic changes.
3. New but chronic appearing 5 x 8 mm lateral right frontal
extra-axial calcified meningioma.
4. Old lacunar infarcts.
5. Sinus and left mastoid disease.

## 2022-10-11 IMAGING — DX DG CHEST 2V
2 series · 2 of 2 positions shown · non-contrast
Comparison: PA Lat 04/08/2020.

CLINICAL DATA: Weakness and dizziness for 2 days.

EXAM:
CHEST - 2 VIEW

[chest pa]
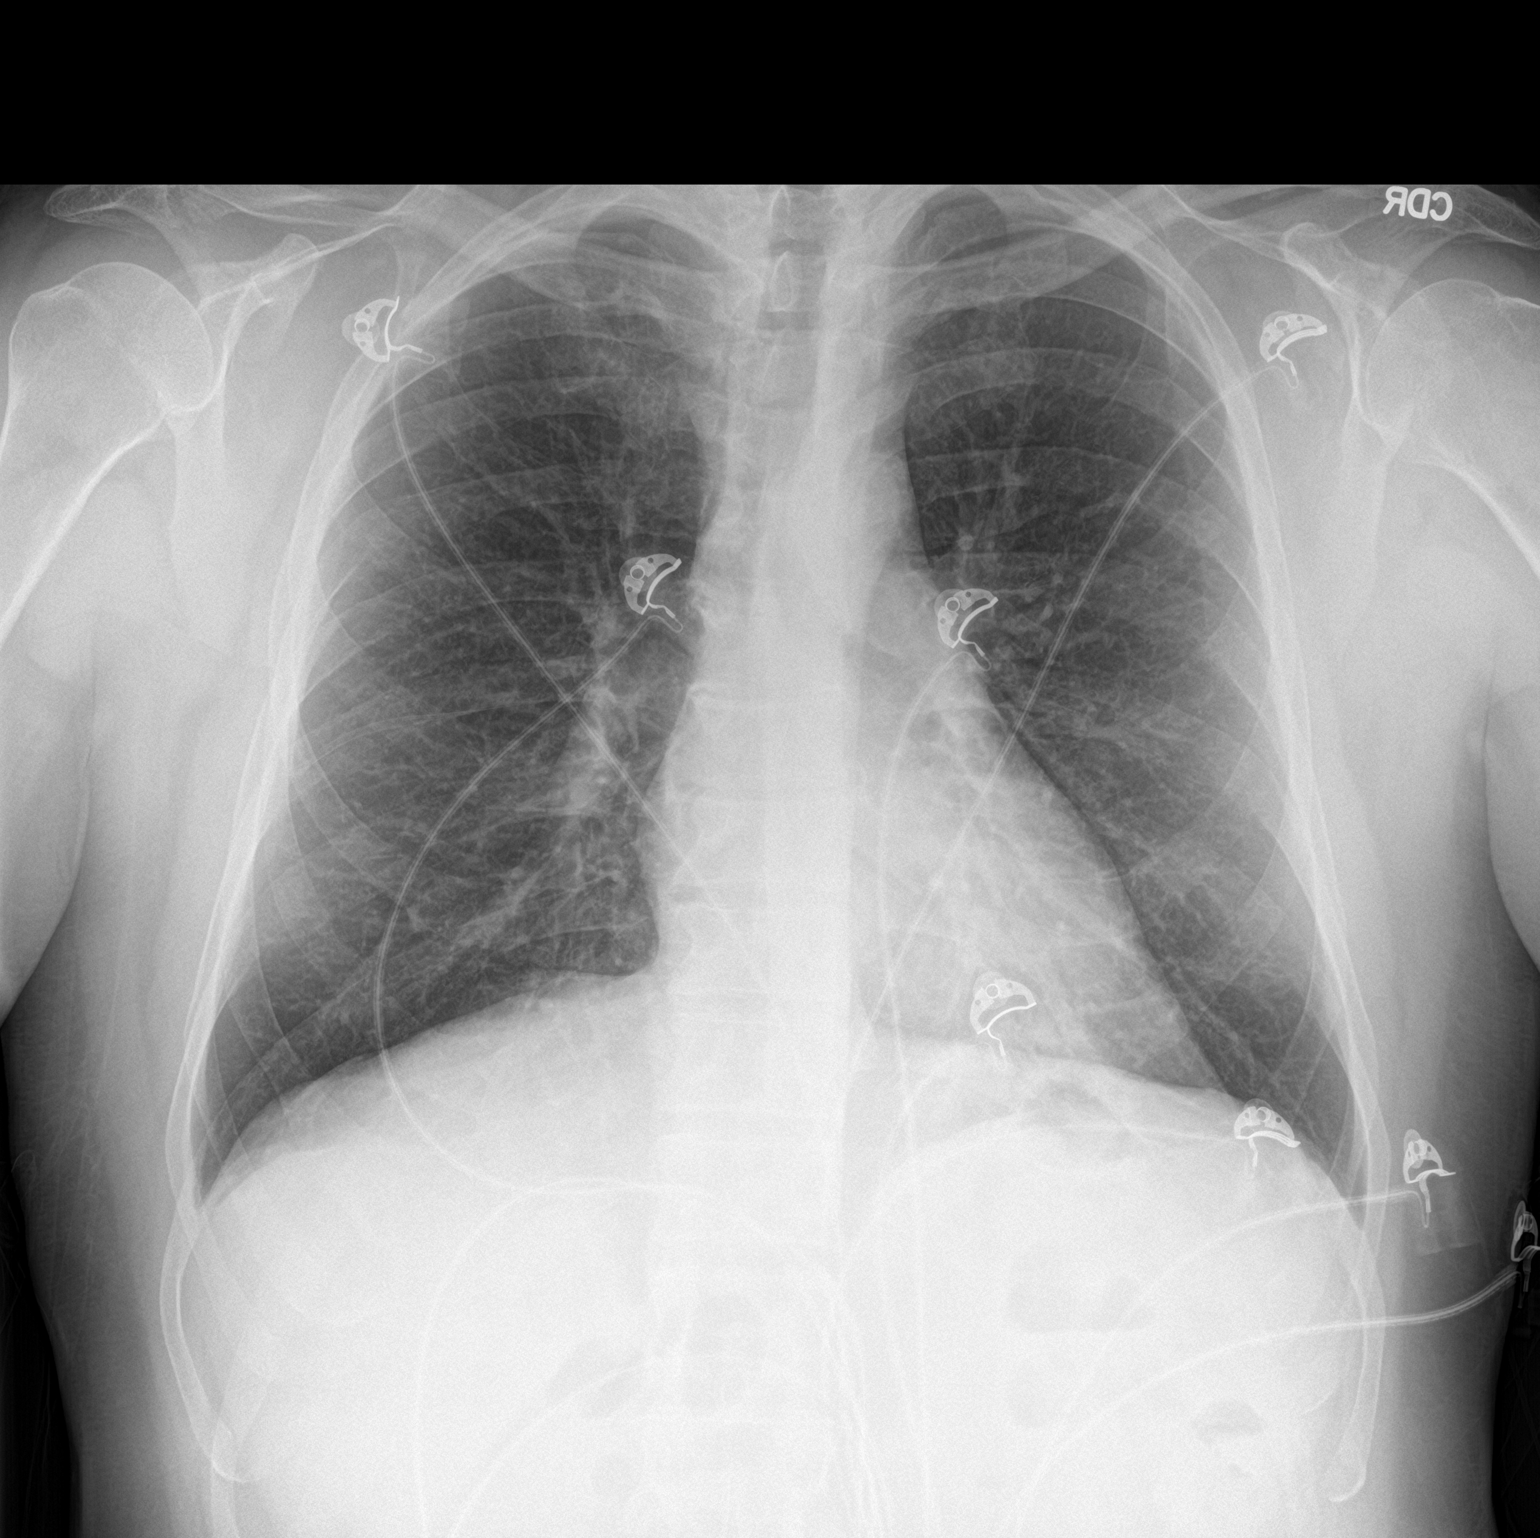

[chest lat]
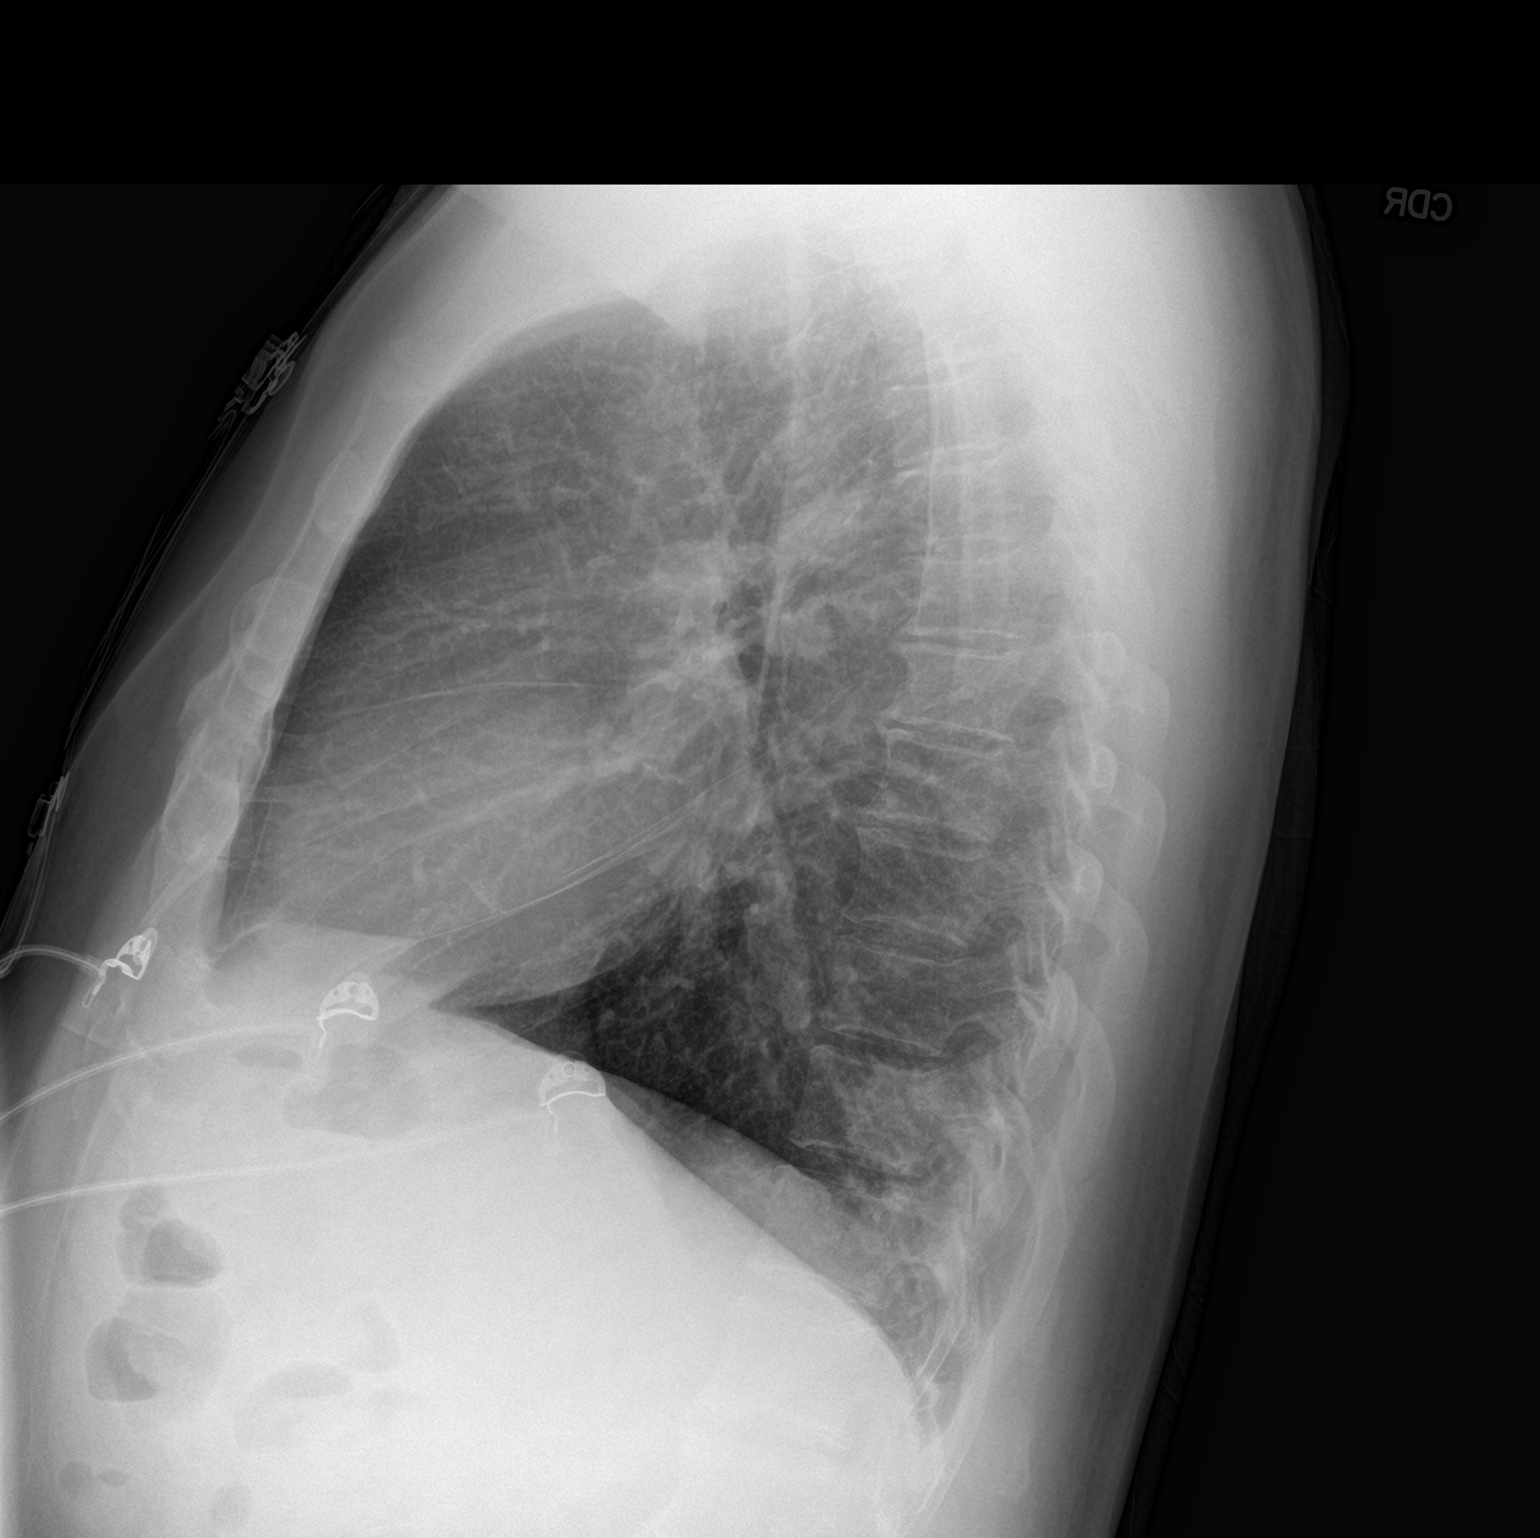

[2 of 2 positions shown; findings below may reference images not displayed]

FINDINGS: The heart size and mediastinal contours are within normal limits
with atherosclerosis in the aortic arch. Both lungs are clear. The
visualized skeletal structures are unremarkable apart from thoracic
spondylosis.
IMPRESSION: No active cardiopulmonary disease. Stable chest with aortic
atherosclerosis.

## 2022-10-18 ENCOUNTER — Other Ambulatory Visit: Payer: Self-pay | Admitting: Family Medicine

## 2022-10-18 DIAGNOSIS — Z8673 Personal history of transient ischemic attack (TIA), and cerebral infarction without residual deficits: Secondary | ICD-10-CM

## 2022-10-21 ENCOUNTER — Encounter: Payer: Self-pay | Admitting: Pharmacist

## 2022-10-21 NOTE — Progress Notes (Unsigned)
Contacted patient regarding upcoming appointment with [facility] pharmacist. Per clinical review, no pharmacist appointment needed at this time. Appointment canceled and voicemail left notifying patient. Patient can re-engage with pharmacy team with future medication needs.

## 2022-10-27 DIAGNOSIS — E109 Type 1 diabetes mellitus without complications: Secondary | ICD-10-CM | POA: Diagnosis not present

## 2022-10-28 ENCOUNTER — Encounter: Payer: Self-pay | Admitting: Family Medicine

## 2022-10-30 ENCOUNTER — Encounter: Payer: Self-pay | Admitting: Family Medicine

## 2022-10-30 ENCOUNTER — Ambulatory Visit (INDEPENDENT_AMBULATORY_CARE_PROVIDER_SITE_OTHER): Payer: PPO | Admitting: Family Medicine

## 2022-10-30 VITALS — BP 124/50 | HR 79 | Temp 96.6°F | Ht 67.0 in | Wt 187.2 lb

## 2022-10-30 DIAGNOSIS — R7401 Elevation of levels of liver transaminase levels: Secondary | ICD-10-CM | POA: Diagnosis not present

## 2022-10-30 DIAGNOSIS — I10 Essential (primary) hypertension: Secondary | ICD-10-CM

## 2022-10-30 DIAGNOSIS — N179 Acute kidney failure, unspecified: Secondary | ICD-10-CM | POA: Diagnosis not present

## 2022-10-30 DIAGNOSIS — E782 Mixed hyperlipidemia: Secondary | ICD-10-CM

## 2022-10-30 DIAGNOSIS — U071 COVID-19: Secondary | ICD-10-CM

## 2022-10-30 LAB — LDL CHOLESTEROL, DIRECT: Direct LDL: 67 mg/dL

## 2022-10-30 LAB — BASIC METABOLIC PANEL
BUN: 61 mg/dL — ABNORMAL HIGH (ref 6–23)
CO2: 21 mEq/L (ref 19–32)
Calcium: 8.9 mg/dL (ref 8.4–10.5)
Chloride: 109 mEq/L (ref 96–112)
Creatinine, Ser: 2.82 mg/dL — ABNORMAL HIGH (ref 0.40–1.50)
GFR: 24.18 mL/min — ABNORMAL LOW (ref >60.00)
Glucose, Bld: 215 mg/dL — ABNORMAL HIGH (ref 70–99)
Potassium: 4.9 mEq/L (ref 3.5–5.1)
Sodium: 136 mEq/L (ref 135–145)

## 2022-10-30 LAB — HEPATIC FUNCTION PANEL
ALT: 25 U/L (ref 0–53)
AST: 18 U/L (ref 0–37)
Albumin: 3.8 g/dL (ref 3.5–5.2)
Alkaline Phosphatase: 102 U/L (ref 39–117)
Bilirubin, Direct: 0.1 mg/dL (ref 0.0–0.3)
Total Bilirubin: 0.4 mg/dL (ref 0.2–1.2)
Total Protein: 6.8 g/dL (ref 6.0–8.3)

## 2022-10-30 NOTE — Progress Notes (Signed)
Established Patient Office Visit   Subjective:  Patient ID: Adrian Neal, male    DOB: October 29, 1965  Age: 57 y.o. MRN: [id number]  No chief complaint on file.   HPI Encounter Diagnoses  Name Primary?   COVID Yes   Elevated transaminase level    Essential hypertension    AKI (acute kidney injury) (HCC)    Mixed hyperlipidemia     For follow-up of above.  5-day history of resolving URI signs and symptoms with nasal congestion postnasal drip sneezing and coughing.  Denies fevers chills, wheezing or difficulty breathing.  Tested positive for COVID 3 days ago.  He had been exposed to his 44-month-old grandson who had been diagnosed with illness.  He is doing well status post hospitalization for Salmonella enteritis.  Has restarted lisinopril Aldactone and chlorthalidone.  Blood pressure is well-controlled.  Will be following up elevated transaminases.   Review of Systems  Constitutional: Negative.  Negative for chills, fever and malaise/fatigue.  HENT: Negative.    Eyes:  Negative for blurred vision, discharge and redness.  Respiratory:  Positive for cough. Negative for hemoptysis, sputum production, shortness of breath and wheezing.   Cardiovascular: Negative.   Gastrointestinal:  Negative for abdominal pain.  Genitourinary: Negative.   Musculoskeletal: Negative.  Negative for myalgias.  Skin:  Negative for rash.  Neurological:  Negative for tingling, loss of consciousness and weakness.  Endo/Heme/Allergies:  Negative for polydipsia.     Current Outpatient Medications:    atorvastatin (LIPITOR) 40 MG tablet, TAKE 1 TABLET BY MOUTH ONCE DAILY AT  6  IN  THE  EVENING, Disp: 90 tablet, Rfl: 2   carvedilol (COREG) 25 MG tablet, Take 1 tablet (25 mg total) by mouth 2 (two) times daily., Disp: 180 tablet, Rfl: 1   chlorthalidone (HYGROTON) 25 MG tablet, Take 25 mg by mouth daily., Disp: , Rfl:    clopidogrel (PLAVIX) 75 MG tablet, TAKE 1 TABLET BY MOUTH ONCE DAILY -  ASPIRIN  AND   PLAVIX  FOR  3  MONTHS  AFTER  THAT  STOP  THE  ASPIRIN  AND  TAKE  PLAVIX  ALONE, Disp: 90 tablet, Rfl: 0   Continuous Blood Gluc Receiver (DEXCOM G6 RECEIVER) DEVI, 1 Device by Does not apply route as directed., Disp: 1 Device, Rfl: 0   Continuous Blood Gluc Sensor (DEXCOM G6 SENSOR) MISC, 3 Devices by Does not apply route as directed., Disp: 3 each, Rfl: 6   Continuous Blood Gluc Transmit (DEXCOM G6 TRANSMITTER) MISC, Use to monitor blood sugar. Change every 90 days., Disp: 1 each, Rfl: 2   lisinopril (ZESTRIL) 40 MG tablet, Take 40 mg by mouth daily., Disp: , Rfl:    methocarbamol (ROBAXIN) 500 MG tablet, Take 1 tablet (500 mg total) by mouth every 8 (eight) hours as needed for muscle spasms., Disp: 40 tablet, Rfl: 0   pantoprazole (PROTONIX) 40 MG tablet, Take 1 tablet by mouth once daily, Disp: 90 tablet, Rfl: 0   Fe Bisgly-Succ-C-Thre-B12-FA (IRON-150 PO), Take 1 tablet by mouth daily., Disp: , Rfl:    glucose blood (ONETOUCH VERIO) test strip, 1 each by Other route 3 (three) times daily. Use as instructed, Disp: 300 each, Rfl: 3   Insulin Infusion Pump (T:SLIM INSULIN PUMP) DEVI, by Does not apply route., Disp: , Rfl:    insulin lispro (HUMALOG) 100 UNIT/ML injection, Max daily dose of 100 units via pump DX E10.59 (vials), Disp: 90 mL, Rfl: 4   spironolactone (ALDACTONE) 25 MG tablet,  Take 0.5 tablets (12.5 mg total) by mouth daily., Disp: 15 tablet, Rfl: 11   Objective:     BP (!) 124/50   Pulse 79   Temp (!) 96.6 F (35.9 C)   Ht 5\' 7"  (1.702 m)   Wt 187 lb 3.2 oz (84.9 kg)   SpO2 97%   BMI 29.32 kg/m    Physical Exam Constitutional:      General: He is not in acute distress.    Appearance: Normal appearance. He is not ill-appearing, toxic-appearing or diaphoretic.  HENT:     Head: Normocephalic and atraumatic.     Right Ear: External ear normal.     Left Ear: External ear normal.  Eyes:     General: No scleral icterus.       Right eye: No discharge.        Left eye:  No discharge.     Extraocular Movements: Extraocular movements intact.     Conjunctiva/sclera: Conjunctivae normal.  Cardiovascular:     Rate and Rhythm: Normal rate and regular rhythm.  Pulmonary:     Effort: Pulmonary effort is normal. No respiratory distress.     Breath sounds: Normal breath sounds. No wheezing, rhonchi or rales.  Abdominal:     General: Bowel sounds are normal.  Musculoskeletal:     Cervical back: No rigidity or tenderness.  Lymphadenopathy:     Cervical: No cervical adenopathy.  Skin:    General: Skin is warm and dry.  Neurological:     Mental Status: He is alert and oriented to person, place, and time.  Psychiatric:        Mood and Affect: Mood normal.        Behavior: Behavior normal.      No results found for any visits on 10/30/22.    The ASCVD Risk score (Arnett DK, et al., 2019) failed to calculate for the following reasons:   The patient has a prior MI or stroke diagnosis    Assessment & Plan:   COVID  Elevated transaminase level -     Hepatic function panel  Essential hypertension -     Basic metabolic panel  AKI (acute kidney injury) (HCC) -     Basic metabolic panel  Mixed hyperlipidemia -     LDL cholesterol, direct    Return in about 3 months (around 01/30/2023), or if symptoms worsen or fail to improve.  Resolving COVID infection.  Continue current medications.  Recommendations made pending results of today's labs.  Anticipate following transaminases.  Mliss Sax, MD

## 2022-11-18 ENCOUNTER — Ambulatory Visit (INDEPENDENT_AMBULATORY_CARE_PROVIDER_SITE_OTHER): Payer: PPO

## 2022-11-18 VITALS — Ht 67.0 in | Wt 190.0 lb

## 2022-11-18 DIAGNOSIS — Z0000 Encounter for general adult medical examination without abnormal findings: Secondary | ICD-10-CM | POA: Diagnosis not present

## 2022-11-18 NOTE — Progress Notes (Signed)
Subjective:   Adrian Neal is a 57 y.o. male who presents for Medicare Annual/Subsequent preventive examination.  Visit Complete: Virtual  I connected with  Adrian Neal on 11/18/22 by a audio enabled telemedicine application and verified that I am speaking with the correct person using two identifiers.  Patient Location: Home  Provider Location: Office/Clinic  I discussed the limitations of evaluation and management by telemedicine. The patient expressed understanding and agreed to proceed.  Per patient no change in vitals since last visit, unable to obtain new vitals due to telehealth visit  Review of Systems     Cardiac Risk Factors include: advanced age (>60men, >56 women);diabetes mellitus;dyslipidemia;hypertension;male gender     Objective:    Today's Vitals   11/18/22 1456  Weight: 190 lb (86.2 kg)  Height: 5\' 7"  (1.702 m)   Body mass index is 29.76 kg/m.     11/18/2022    3:03 PM 10/07/2021   10:42 PM 09/19/2021   11:09 AM 09/12/2020   12:52 PM 04/08/2020    2:28 AM 11/25/2019    9:31 AM 09/14/2019    7:41 AM  Advanced Directives  Does Patient Have a Medical Advance Directive? No No No No No No No  Would patient like information on creating a medical advance directive?  No - Patient declined No - Patient declined Yes (MAU/Ambulatory/Procedural Areas - Information given)  Yes (MAU/Ambulatory/Procedural Areas - Information given)     Current Medications (verified) Outpatient Encounter Medications as of 11/18/2022  Medication Sig   atorvastatin (LIPITOR) 40 MG tablet TAKE 1 TABLET BY MOUTH ONCE DAILY AT  6  IN  THE  EVENING   carvedilol (COREG) 25 MG tablet Take 1 tablet (25 mg total) by mouth 2 (two) times daily.   chlorthalidone (HYGROTON) 25 MG tablet Take 25 mg by mouth daily.   clopidogrel (PLAVIX) 75 MG tablet TAKE 1 TABLET BY MOUTH ONCE DAILY -  ASPIRIN  AND  PLAVIX  FOR  3  MONTHS  AFTER  THAT  STOP  THE  ASPIRIN  AND  TAKE  PLAVIX  ALONE    Continuous Blood Gluc Receiver (DEXCOM G6 RECEIVER) DEVI 1 Device by Does not apply route as directed.   Continuous Blood Gluc Sensor (DEXCOM G6 SENSOR) MISC 3 Devices by Does not apply route as directed.   Continuous Blood Gluc Transmit (DEXCOM G6 TRANSMITTER) MISC Use to monitor blood sugar. Change every 90 days.   glucose blood (ONETOUCH VERIO) test strip 1 each by Other route 3 (three) times daily. Use as instructed   Insulin Infusion Pump (T:SLIM INSULIN PUMP) DEVI by Does not apply route.   insulin lispro (HUMALOG) 100 UNIT/ML injection Max daily dose of 100 units via pump DX E10.59 (vials)   lisinopril (ZESTRIL) 40 MG tablet Take 40 mg by mouth daily.   pantoprazole (PROTONIX) 40 MG tablet Take 1 tablet by mouth once daily   methocarbamol (ROBAXIN) 500 MG tablet Take 1 tablet (500 mg total) by mouth every 8 (eight) hours as needed for muscle spasms. (Patient not taking: Reported on 11/18/2022)   spironolactone (ALDACTONE) 25 MG tablet Take 0.5 tablets (12.5 mg total) by mouth daily.   No facility-administered encounter medications on file as of 11/18/2022.    Allergies (verified) Patient has no known allergies.   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   Sleep apnea    on BiPAP   Stroke (HCC) 2016   Past Surgical History:  Procedure Laterality Date   ANTERIOR APPROACH HEMI HIP ARTHROPLASTY Left 01/01/2019   Procedure: ANTERIOR APPROACH total hip ARTHROPLASTY;  Surgeon: Samson Frederic, MD;  Location: [facility] OR;  Service: Orthopedics;  Laterality: Left;   CATARACT EXTRACTION Right 1994   CATARACT EXTRACTION W/ INTRAOCULAR LENS IMPLANT   1994   EYE SURGERY Right 1991   vitrectomy   EYE SURGERY Right 1992   scar tissue removed from retina   EYE SURGERY Right 1994   Cat Sx   Family History  Problem Relation Age of Onset   Hypertension Mother    Hyperlipidemia Mother    Hyperlipidemia Father    Hypertension Father    Diabetes Father    Kidney disease Father        had a kidney transplant    Stroke Brother    Diabetes Brother    Pulmonary fibrosis Paternal Grandmother    Lung cancer Paternal Grandfather    Diabetes Daughter    Colon cancer Neg Hx    Esophageal cancer Neg Hx    Rectal cancer Neg Hx    Stomach cancer Neg Hx    Social History   Socioeconomic History   Marital status: Married    Spouse name: Not on file   Number of children: 3   Years of education: Not on file   Highest education level: Not on file  Occupational History   Occupation: Disabled  Tobacco Use   Smoking status: Former    Current packs/day: 0.00    Average packs/day: 1 pack/day for 6.0 years (6.0 ttl pk-yrs)    Types: Cigarettes    Start date: 04/29/1985    Quit date: 04/30/1991    Years since quitting: 31.5   Smokeless tobacco: Never  Vaping Use   Vaping status: Never Used  Substance and Sexual Activity   Alcohol use: No   Drug use: Never   Sexual activity: Not on file  Other Topics Concern   Not on file  Social History Narrative   Right handed   Lives wife and kids in a two story home   Social Determinants of Health   Financial Resource Strain: Low Risk  (11/18/2022)   Overall Financial Resource Strain (CARDIA)    Difficulty of Paying Living Expenses: Not hard at all  Recent Concern: Financial Resource Strain - Medium Risk (09/25/2022)   Received from [facility]   Overall Financial Resource Strain (CARDIA)    Difficulty of Paying Living Expenses: Somewhat hard  Food Insecurity: No Food Insecurity (11/18/2022)   Hunger Vital Sign    Worried About Running Out of Food  in the Last Year: Never true    Ran Out of Food in the Last Year: Never true  Transportation Needs: No Transportation Needs (11/18/2022)   PRAPARE - Administrator, Civil Service (Medical): No    Lack of Transportation (Non-Medical): No  Physical Activity: Inactive (11/18/2022)   Exercise Vital Sign    Days of Exercise per Week: 0 days    Minutes of Exercise per  Session: 0 min  Stress: No Stress Concern Present (11/18/2022)   [employer] of Occupational Health - Occupational Stress Questionnaire    Feeling of Stress : Not at all  Social Connections: Socially Integrated (11/18/2022)   Social Connection and Isolation Panel [NHANES]    Frequency of Communication with Friends and Family: More than three times a week    Frequency of Social Gatherings with Friends and Family: More than three times a week    Attends Religious Services: More than 4 times per year    Active Member of [employer] or Organizations: Yes    Attends Engineer, structural: More than 4 times per year    Marital Status: Married    Tobacco Counseling Counseling given: Not Answered   Clinical Intake:  Pre-visit preparation completed: Yes  Pain : No/denies pain     Nutritional Status: BMI 25 -29 Overweight Nutritional Risks: None Diabetes: Yes CBG done?: No Did pt. bring in CBG monitor from home?: No  How often do you need to have someone help you when you read instructions, pamphlets, or other written materials from your doctor or pharmacy?: 1 - Never  Interpreter Needed?: No  Information entered by :: NAllen LPN   Activities of Daily Living    11/18/2022    2:57 PM  In your present state of health, do you have any difficulty performing the following activities:  Hearing? 0  Vision? 0  Difficulty concentrating or making decisions? 0  Walking or climbing stairs? 1  Dressing or bathing? 0  Doing errands, shopping? 0  Preparing Food and eating ? N  Using the Toilet? N  In the past  six months, have you accidently leaked urine? N  Do you have problems with loss of bowel control? N  Managing your Medications? N  Managing your Finances? N  Housekeeping or managing your Housekeeping? N    Patient Care Team: Mliss Sax, MD as PCP - General (Family Medicine) Gaspar Cola, [facility] (Inactive) as Pharmacist (Pharmacist)  Indicate any recent Medical Services you may have received from other than [facility] providers in the past year (date may be approximate).     Assessment:   This is a routine wellness examination for Adrian Neal.  Hearing/Vision screen Hearing Screening - Comments:: Denies hearing issues Vision Screening - Comments:: Regular eye exams, [facility],   Dietary issues and exercise activities discussed:     Goals Addressed             This Visit's Progress    Patient Stated       11/18/2022, keep kidneys working as well as they can to avoid dialysis       Depression Screen    11/18/2022    3:05 PM 10/01/2022   11:23 AM 09/02/2022    3:34 PM 03/04/2022    3:52 PM 10/19/2021   11:31 AM 09/19/2021   11:12 AM 09/19/2021   11:10 AM  PHQ 2/9 Scores  PHQ - 2 Score 0 0 0 0 0 0 0  PHQ- 9 Score 0          Fall Risk    11/18/2022    3:04 PM 10/01/2022   11:23 AM 09/02/2022    3:34 PM 03/04/2022    3:52 PM 10/19/2021   11:31 AM  Fall Risk   Falls in the past year? 0 0 0 0 0  Number falls in past yr: 0 0 0 0 0  Injury with Fall? 0 0  0 0   Risk for fall due to : Medication side effect No Fall Risks No Fall Risks    Follow up Falls prevention discussed;Falls evaluation completed Falls evaluation completed Falls evaluation completed      MEDICARE RISK AT HOME:  Medicare Risk at Home - 11/18/22 1504     Any stairs in or around the home? Yes    If so, are there any without handrails? No    Home free of loose throw rugs in walkways, pet beds, electrical cords, etc? Yes    Adequate lighting in your home to reduce risk of falls? Yes    Life alert?  No    Use of a cane, walker or w/c? No    Grab bars in the bathroom? Yes    Shower chair or bench in shower? No    Elevated toilet seat or a handicapped toilet? No             TIMED UP AND GO:  Was the test performed?  No    Cognitive Function:        11/18/2022    3:05 PM  6CIT Screen  What Year? 0 points  What month? 0 points  What time? 0 points  Count back from 20 0 points  Months in reverse 0 points  Repeat phrase 2 points  Total Score 2 points    Immunizations Immunization History  Administered Date(s) Administered   Influenza,inj,Quad PF,6+ Mos 07/15/2018, 01/08/2019, 02/27/2021, 03/04/2022   Influenza-Unspecified 05/29/2020   PFIZER Comirnaty(Gray Top)Covid-19 Tri-Sucrose Vaccine 07/09/2019, 07/29/2019, 05/29/2020   Pneumococcal Polysaccharide-23 03/02/2019   Pneumococcal-Unspecified 11/29/2020   Tdap 04/29/2017   Zoster Recombinant(Shingrix) 11/27/2020    TDAP status: Up to date  Flu Vaccine status: Up to date  Pneumococcal vaccine status: Up to date  Covid-19 vaccine status: Completed vaccines  Qualifies for Shingles Vaccine? Yes   Zostavax completed Yes   Shingrix Completed?: checking on second dose  Screening Tests Health Maintenance  Topic Date Due   Hepatitis C Screening  Never done   Zoster Vaccines- Shingrix (2 of 2) 01/22/2021   Diabetic kidney evaluation - Urine ACR  05/31/2022   HEMOGLOBIN A1C  11/27/2022   INFLUENZA VACCINE  11/28/2022   OPHTHALMOLOGY EXAM  02/07/2023   FOOT EXAM  05/30/2023   Diabetic kidney evaluation - eGFR measurement  10/30/2023   Medicare Annual Wellness (AWV)  11/18/2023   Colonoscopy  07/01/2025   DTaP/Tdap/Td (2 - Td or Tdap) 04/30/2027   HIV Screening  Completed   HPV VACCINES  Aged Out   COVID-19 Vaccine  Discontinued    Health Maintenance  Health Maintenance Due  Topic Date Due   Hepatitis C Screening  Never done   Zoster Vaccines- Shingrix (2 of 2) 01/22/2021   Diabetic kidney evaluation  - Urine ACR  05/31/2022    Colorectal cancer screening: Type of screening: Colonoscopy. Completed 07/02/2018. Repeat every 7 years  Lung Cancer Screening: (Low Dose CT Chest recommended if Age 30-80 years, 20 pack-year currently smoking OR have quit w/in 15years.) does not qualify.   Lung Cancer Screening Referral: no  Additional Screening:  Hepatitis C Screening: does qualify;   Vision Screening: Recommended annual ophthalmology exams for early detection of glaucoma and other disorders of the eye. Is the patient up to date with their annual eye exam?  Yes  Who is the provider or what is the name of the office in which the patient attends annual eye exams? [facility] If  pt is not established with a provider, would they like to be referred to a provider to establish care? No .   Dental Screening: Recommended annual dental exams for proper oral hygiene  Diabetic Foot Exam: Diabetic Foot Exam: Completed 05/29/2022  Community Resource Referral / Chronic Care Management: CRR required this visit?  No   CCM required this visit?  No     Plan:     I have personally reviewed and noted the following in the patient's chart:   Medical and social history Use of alcohol, tobacco or illicit drugs  Current medications and supplements including opioid prescriptions. Patient is not currently taking opioid prescriptions. Functional ability and status Nutritional status Physical activity Advanced directives List of other physicians Hospitalizations, surgeries, and ER visits in previous 12 months Vitals Screenings to include cognitive, depression, and falls Referrals and appointments  In addition, I have reviewed and discussed with patient certain preventive protocols, quality metrics, and best practice recommendations. A written personalized care plan for preventive services as well as general preventive health recommendations were provided to patient.     Barb Merino,  LPN   [id number]   After Visit Summary: (MyChart) Due to this being a telephonic visit, the after visit summary with patients personalized plan was offered to patient via MyChart   Nurse Notes: none

## 2022-11-18 NOTE — Patient Instructions (Signed)
Adrian Neal , Thank you for taking time to come for your Medicare Wellness Visit. I appreciate your ongoing commitment to your health goals. Please review the following plan we discussed and let me know if I can assist you in the future.   These are the goals we discussed:  Goals      DIET - REDUCE SUGAR INTAKE     Monitor and Manage My Blood Sugar-Diabetes Type 1     Timeframe:  Long-Range Goal Priority:  High Start Date: 09/20/21                            Expected End Date: 09/21/22                      Follow Up within 30 days    - check blood sugar at prescribed times - check blood sugar before and after exercise - check blood sugar if I feel it is too high or too low - take the blood sugar meter to all doctor visits    Why is this important?   Checking your blood sugar at home helps to keep it from getting very high or very low.  Writing the results in a diary or log helps the doctor know how to care for you.  Your blood sugar log should have the time, the date and the results.  Also, write down the amount of insulin or other medicine you take.  Other information like what you ate, exercise done and how you were feeling will also be helpful..     Notes:      Patient Stated     Stop drinking soda     Patient Stated     11/18/2022, keep kidneys working as well as they can to avoid dialysis        This is a list of the screening recommended for you and due dates:  Health Maintenance  Topic Date Due   Hepatitis C Screening  Never done   Zoster (Shingles) Vaccine (2 of 2) 01/22/2021   Yearly kidney health urinalysis for diabetes  05/31/2022   Hemoglobin A1C  11/27/2022   Flu Shot  11/28/2022   Eye exam for diabetics  02/07/2023   Complete foot exam   05/30/2023   Yearly kidney function blood test for diabetes  10/30/2023   Medicare Annual Wellness Visit  11/18/2023   Colon Cancer Screening  07/01/2025   DTaP/Tdap/Td vaccine (2 - Td or Tdap) 04/30/2027   HIV  Screening  Completed   HPV Vaccine  Aged Out   COVID-19 Vaccine  Discontinued    Advanced directives: Please bring a copy of your POA (Power of Sanders) and/or Living Will to your next appointment.   Conditions/risks identified: none  Next appointment: Follow up in one year for your annual wellness visit   Preventive Care 40-64 Years, Male Preventive care refers to lifestyle choices and visits with your health care provider that can promote health and wellness. What does preventive care include? A yearly physical exam. This is also called an annual well check. Dental exams once or twice a year. Routine eye exams. Ask your health care provider how often you should have your eyes checked. Personal lifestyle choices, including: Daily care of your teeth and gums. Regular physical activity. Eating a healthy diet. Avoiding tobacco and drug use. Limiting alcohol use. Practicing safe sex. Taking low-dose aspirin every day starting at age 65.  What happens during an annual well check? The services and screenings done by your health care provider during your annual well check will depend on your age, overall health, lifestyle risk factors, and family history of disease. Counseling  Your health care provider may ask you questions about your: Alcohol use. Tobacco use. Drug use. Emotional well-being. Home and relationship well-being. Sexual activity. Eating habits. Work and work Astronomer. Screening  You may have the following tests or measurements: Height, weight, and BMI. Blood pressure. Lipid and cholesterol levels. These may be checked every 5 years, or more frequently if you are over 21 years old. Skin check. Lung cancer screening. You may have this screening every year starting at age 30 if you have a 30-pack-year history of smoking and currently smoke or have quit within the past 15 years. Fecal occult blood test (FOBT) of the stool. You may have this test every year starting  at age 52. Flexible sigmoidoscopy or colonoscopy. You may have a sigmoidoscopy every 5 years or a colonoscopy every 10 years starting at age 41. Prostate cancer screening. Recommendations will vary depending on your family history and other risks. Hepatitis C blood test. Hepatitis B blood test. Sexually transmitted disease (STD) testing. Diabetes screening. This is done by checking your blood sugar (glucose) after you have not eaten for a while (fasting). You may have this done every 1-3 years. Discuss your test results, treatment options, and if necessary, the need for more tests with your health care provider. Vaccines  Your health care provider may recommend certain vaccines, such as: Influenza vaccine. This is recommended every year. Tetanus, diphtheria, and acellular pertussis (Tdap, Td) vaccine. You may need a Td booster every 10 years. Zoster vaccine. You may need this after age 52. Pneumococcal 13-valent conjugate (PCV13) vaccine. You may need this if you have certain conditions and have not been vaccinated. Pneumococcal polysaccharide (PPSV23) vaccine. You may need one or two doses if you smoke cigarettes or if you have certain conditions. Talk to your health care provider about which screenings and vaccines you need and how often you need them. This information is not intended to replace advice given to you by your health care provider. Make sure you discuss any questions you have with your health care provider. Document Released: 05/12/2015 Document Revised: 01/03/2016 Document Reviewed: 02/14/2015 Elsevier Interactive Patient Education  2017 [employer].  Fall Prevention in the Home Falls can cause injuries. They can happen to people of all ages. There are many things you can do to make your home safe and to help prevent falls. What can I do on the outside of my home? Regularly fix the edges of walkways and driveways and fix any cracks. Remove anything that might make you trip  as you walk through a door, such as a raised step or threshold. Trim any bushes or trees on the path to your home. Use bright outdoor lighting. Clear any walking paths of anything that might make someone trip, such as rocks or tools. Regularly check to see if handrails are loose or broken. Make sure that both sides of any steps have handrails. Any raised decks and porches should have guardrails on the edges. Have any leaves, snow, or ice cleared regularly. Use sand or salt on walking paths during winter. Clean up any spills in your garage right away. This includes oil or grease spills. What can I do in the bathroom? Use night lights. Install grab bars by the toilet and in the tub and  shower. Do not use towel bars as grab bars. Use non-skid mats or decals in the tub or shower. If you need to sit down in the shower, use a plastic, non-slip stool. Keep the floor dry. Clean up any water that spills on the floor as soon as it happens. Remove soap buildup in the tub or shower regularly. Attach bath mats securely with double-sided non-slip rug tape. Do not have throw rugs and other things on the floor that can make you trip. What can I do in the bedroom? Use night lights. Make sure that you have a light by your bed that is easy to reach. Do not use any sheets or blankets that are too big for your bed. They should not hang down onto the floor. Have a firm chair that has side arms. You can use this for support while you get dressed. Do not have throw rugs and other things on the floor that can make you trip. What can I do in the kitchen? Clean up any spills right away. Avoid walking on wet floors. Keep items that you use a lot in easy-to-reach places. If you need to reach something above you, use a strong step stool that has a grab bar. Keep electrical cords out of the way. Do not use floor polish or wax that makes floors slippery. If you must use wax, use non-skid floor wax. Do not have throw  rugs and other things on the floor that can make you trip. What can I do with my stairs? Do not leave any items on the stairs. Make sure that there are handrails on both sides of the stairs and use them. Fix handrails that are broken or loose. Make sure that handrails are as long as the stairways. Check any carpeting to make sure that it is firmly attached to the stairs. Fix any carpet that is loose or worn. Avoid having throw rugs at the top or bottom of the stairs. If you do have throw rugs, attach them to the floor with carpet tape. Make sure that you have a light switch at the top of the stairs and the bottom of the stairs. If you do not have them, ask someone to add them for you. What else can I do to help prevent falls? Wear shoes that: Do not have high heels. Have rubber bottoms. Are comfortable and fit you well. Are closed at the toe. Do not wear sandals. If you use a stepladder: Make sure that it is fully opened. Do not climb a closed stepladder. Make sure that both sides of the stepladder are locked into place. Ask someone to hold it for you, if possible. Clearly mark and make sure that you can see: Any grab bars or handrails. First and last steps. Where the edge of each step is. Use tools that help you move around (mobility aids) if they are needed. These include: Canes. Walkers. Scooters. Crutches. Turn on the lights when you go into a dark area. Replace any light bulbs as soon as they burn out. Set up your furniture so you have a clear path. Avoid moving your furniture around. If any of your floors are uneven, fix them. If there are any pets around you, be aware of where they are. Review your medicines with your doctor. Some medicines can make you feel dizzy. This can increase your chance of falling. Ask your doctor what other things that you can do to help prevent falls. This information is not intended to  replace advice given to you by your health care provider. Make  sure you discuss any questions you have with your health care provider. Document Released: 02/09/2009 Document Revised: 09/21/2015 Document Reviewed: 05/20/2014 Elsevier Interactive Patient Education  2017 [employer].

## 2022-11-25 ENCOUNTER — Other Ambulatory Visit: Payer: Self-pay | Admitting: Family Medicine

## 2022-11-25 DIAGNOSIS — E782 Mixed hyperlipidemia: Secondary | ICD-10-CM

## 2022-11-27 ENCOUNTER — Encounter: Payer: Self-pay | Admitting: Internal Medicine

## 2022-11-27 ENCOUNTER — Ambulatory Visit (INDEPENDENT_AMBULATORY_CARE_PROVIDER_SITE_OTHER): Payer: PPO | Admitting: Internal Medicine

## 2022-11-27 VITALS — BP 122/80 | HR 80 | Ht 67.0 in | Wt 188.0 lb

## 2022-11-27 DIAGNOSIS — E1059 Type 1 diabetes mellitus with other circulatory complications: Secondary | ICD-10-CM

## 2022-11-27 DIAGNOSIS — N184 Chronic kidney disease, stage 4 (severe): Secondary | ICD-10-CM | POA: Diagnosis not present

## 2022-11-27 DIAGNOSIS — E10319 Type 1 diabetes mellitus with unspecified diabetic retinopathy without macular edema: Secondary | ICD-10-CM

## 2022-11-27 DIAGNOSIS — E1022 Type 1 diabetes mellitus with diabetic chronic kidney disease: Secondary | ICD-10-CM

## 2022-11-27 LAB — POCT GLYCOSYLATED HEMOGLOBIN (HGB A1C): Hemoglobin A1C: 7 % — AB (ref 4.0–5.6)

## 2022-11-27 MED ORDER — DEXCOM G6 SENSOR MISC
3.0000 | 6 refills | Status: DC
Start: 2022-11-27 — End: 2023-06-24

## 2022-11-27 MED ORDER — DEXCOM G6 TRANSMITTER MISC
3 refills | Status: DC
Start: 2022-11-27 — End: 2023-06-24

## 2022-11-27 NOTE — Progress Notes (Signed)
Name: Adrian Neal  Age/ Sex: 57 y.o., male   MRN/ DOB: [id number], Jul 12, 1965     PCP: Mliss Sax, MD   Reason for Endocrinology Evaluation: Type 1 Diabetes Mellitus  Initial Endocrine Consultative Visit: 06/18/2018    PATIENT IDENTIFIER: [name] is a 57 y.o. male with a past medical history of .HTN,Hyperlipidemia and Hx of CVA  The patient has followed with Endocrinology clinic since 06/18/2018 for consultative assistance with management of his diabetes.  DIABETIC HISTORY:  [name] was diagnosed with T1DM at age 59, he has been on insulin pump since 2012. His hemoglobin A1c has ranged from 7.2% , peaking at 9.0%   In 11/2018 he switched to the T-Slim   SUBJECTIVE:   During the last visit (05/29/2022): A1c 7.2 %    Today (11/27/2022): [name] is here for a follow up on diabetes management and his new pump.  He checks his blood sugars multiple times daily, preprandial and bedtime. The patient has had hypoglycemic episodes since the last clinic visit, which typically occur 2 x /week - most often occuring during the day after meals. The patient is symptomatic with these episodes.  Is seeing nephrology with CKD IV    He presented to the ED 08/2022 with anorexia and fatigue was noted with hyperglycemia, AKI, and transaminitis, his atorvastatin, chlorthalidone, spironolactone were discontinued  He was diagnosed with COVID 10/30/2022 , felt like a cold  Denies constipation or diarrhea    This patient with type 1 diabetes is treated with humalog (insulin pump). The clinical list was updated.     Current pump settings     Pump   T-Slim      Insulin type   HUMALOG    Basal rate       0000   1.1 u/h    0600 1.0u/h   1100 0.9      I:C ratio   0000 1:8   0600 1:5   1100 1:7      Sensitivity      0000 30      AIT      0000  5      Goal       0000  110          Type & Model of Pump:T-Slim  Insulin Type: Currently using  Humalog   PUMP STATISTICS:  7/18-7/31/2024 Average BG Dashboard : 178 Average daily Carbs 197 Average Total Daily Insulin:  78.13 u/day  Average Daily Basal:22(31 %) Average Daily Bolus: 50 (69 %)   CONTINUOUS GLUCOSE MONITORING RECORD INTERPRETATION    Dates of Recording: 7/18-7/31/2024  Sensor description:dexcom  Results statistics:   CGM use % of time 91  Average and SD 178/41  Time in range 8   %  % Time Above 180 90  % Time Below target 3   Glycemic patterns summary: BG's optimal during the night and fluctuate during the day  Hyperglycemic episodes  postprandial   Hypoglycemic episodes occurred n/a  Overnight periods: optimal      Statin: yes ACE-I/ARB: yes      DIABETIC COMPLICATIONS: Microvascular complications:  CKD IV, 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  Denies: Neuropathy  Last eye exam: Completed 01/2022   Macrovascular complications:  CVA Denies: CAD, PVD       HISTORY:  Past Medical History:  Past Medical History:  Diagnosis Date   Cataract    Mixed form OS   Chronic kidney disease  Diabetes mellitus without complication (HCC)    diagnosed at age 83   Eye problems    Heart murmur 1996   High cholesterol    patient denies but take preventative medicine   Hypertension    Hypertensive retinopathy    OU   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   Sleep apnea    on BiPAP   Stroke (HCC) 2016   Past Surgical History:  Past Surgical History:  Procedure Laterality Date   ANTERIOR APPROACH HEMI HIP ARTHROPLASTY Left 01/01/2019   Procedure: ANTERIOR APPROACH total hip ARTHROPLASTY;  Surgeon: Samson Frederic, MD;  Location: [facility] OR;  Service: Orthopedics;  Laterality: Left;   CATARACT EXTRACTION Right 1994   CATARACT EXTRACTION W/ INTRAOCULAR LENS IMPLANT  1994   EYE SURGERY Right 1991   vitrectomy   EYE SURGERY Right 1992   scar tissue removed from retina   EYE SURGERY Right 1994   Cat Sx   Social History:  reports that he  quit smoking about 31 years ago. His smoking use included cigarettes. He started smoking about 37 years ago. He has a 6 pack-year smoking history. He has never used smokeless tobacco. He reports that he does not drink alcohol and does not use drugs. Family History:  Family History  Problem Relation Age of Onset   Hypertension Mother    Hyperlipidemia Mother    Hyperlipidemia Father    Hypertension Father    Diabetes Father    Kidney disease Father        had a kidney transplant    Stroke Brother    Diabetes Brother    Pulmonary fibrosis Paternal Grandmother    Lung cancer Paternal Grandfather    Diabetes Daughter    Colon cancer Neg Hx    Esophageal cancer Neg Hx    Rectal cancer Neg Hx    Stomach cancer Neg Hx     HOME MEDICATIONS: Allergies as of 11/27/2022   No Known Allergies      Medication List        Accurate as of November 27, 2022  9:27 AM. If you have any questions, ask your nurse or doctor.          atorvastatin 40 MG tablet Commonly known as: LIPITOR TAKE 1 TABLET BY MOUTH ONCE DAILY AT  6  PM   carvedilol 25 MG tablet Commonly known as: COREG Take 1 tablet (25 mg total) by mouth 2 (two) times daily.   chlorthalidone 25 MG tablet Commonly known as: HYGROTON Take 25 mg by mouth daily.   clopidogrel 75 MG tablet Commonly known as: PLAVIX TAKE 1 TABLET BY MOUTH ONCE DAILY -  ASPIRIN  AND  PLAVIX  FOR  3  MONTHS  AFTER  THAT  STOP  THE  ASPIRIN  AND  TAKE  PLAVIX  ALONE   Dexcom G6 Receiver Devi 1 Device by Does not apply route as directed.   Dexcom G6 Sensor Misc 3 Devices by Does not apply route as directed.   Dexcom G6 Transmitter Misc Use to monitor blood sugar. Change every 90 days.   insulin lispro 100 UNIT/ML injection Commonly known as: HumaLOG Max daily dose of 100 units via pump DX E10.59 (vials)   lisinopril 40 MG tablet Commonly known as: ZESTRIL Take 40 mg by mouth daily.   methocarbamol 500 MG tablet Commonly known as:  ROBAXIN Take 1 tablet (500 mg total) by mouth every 8 (eight) hours as  needed for muscle spasms.   OneTouch Verio test strip Generic drug: glucose blood 1 each by Other route 3 (three) times daily. Use as instructed   pantoprazole 40 MG tablet Commonly known as: PROTONIX Take 1 tablet by mouth once daily   spironolactone 25 MG tablet Commonly known as: ALDACTONE Take 0.5 tablets (12.5 mg total) by mouth daily.   T:slim Insulin Pump Devi by Does not apply route.       PHYSICAL EXAM: VS: BP 122/80 (BP Location: Left Arm, Patient Position: Sitting, Cuff Size: Small)   Pulse 80   Ht 5\' 7"  (1.702 m)   Wt 188 lb (85.3 kg)   SpO2 97%   BMI 29.44 kg/m    EXAM: General: Pt appears well and is in NAD  Lungs: Clear with good BS bilat  Heart: Auscultation: RRR   Extremities:  BL LE: no pretibial edema   Mental Status: Judgment, insight: intact Orientation: oriented to time, place, and person Mood and affect: no depression, anxiety, or agitation   DM Foot Exam 05/29/2022  The skin of the feet is without sores or ulcerations, toe nails dystrophic The pedal pulses are 1+ on right and 1+ on left. The sensation is intact  to a screening 5.07, 10 gram monofilament bilaterally      DATA REVIEWED:  Lab Results  Component Value Date   HGBA1C 7.0 (A) 11/27/2022   HGBA1C 7.2 (A) 05/29/2022   HGBA1C 7.2 (A) 07/06/2021    Latest Reference Range & Units 10/30/22 09:37  Sodium 135 - 145 mEq/L 136  Potassium 3.5 - 5.1 mEq/L 4.9  Chloride 96 - 112 mEq/L 109  CO2 19 - 32 mEq/L 21  Glucose 70 - 99 mg/dL 161 (H)  BUN 6 - 23 mg/dL 61 (H)  Creatinine 0.96 - 1.50 mg/dL 0.45 (H)  Calcium 8.4 - 10.5 mg/dL 8.9  Alkaline Phosphatase 39 - 117 U/L 102  Albumin 3.5 - 5.2 g/dL 3.8  AST 0 - 37 U/L 18  ALT 0 - 53 U/L 25  Total Protein 6.0 - 8.3 g/dL 6.8  Bilirubin, Direct 0.0 - 0.3 mg/dL 0.1  Total Bilirubin 0.2 - 1.2 mg/dL 0.4  GFR >40.98 mL/min 24.18 (L)    Latest Reference Range &  Units 10/30/22 09:37  Direct LDL mg/dL 11.9    Old records , labs and images have been reviewed.    ASSESSMENT / PLAN / RECOMMENDATIONS:   1) Type 1 Diabetes Mellitus, Optimally controlled, With CKD IV, retinopathic and  macrovascular complications - Most recent A1c of 7.0 %. Goal A1c < 7.5 %.   - His A1c remains  stable  - In reviewing CGM/pump download, patient has been noted with glycemia at bedtime that continues throughout the night -I have encouraged the patient to continue to bolus for CHO consumed at bedtime as well -I will increase basal rate at midnight   MEDICATIONS:  Pump   T-Slim      Insulin type   HUMALOG    Basal rate       0000   1.2 u/h    0600 1.0u/h   1100 0.9      I:C ratio   0000 1:8   0600 1:5   1100 1:7      Sensitivity      0000 30      AIT      0000  5      Goal       0000  110  EDUCATION / INSTRUCTIONS: BG monitoring instructions: Patient is instructed to check his blood sugars 4 times a day, before meals and bedtime. Call [facility] if: BG persistently < 70  I reviewed the Rule of 15 for the treatment of hypoglycemia in detail with the patient. Literature supplied.    F/U in 6 months   I spent 30 minutes preparing to see the patient by review of recent labs, imaging and procedures, obtaining and reviewing separately obtained history, communicating with the patient, ordering medications, tests or procedures, and documenting clinical information in the EHR including the differential Dx, treatment, and any further evaluation and other management     Signed electronically by: Lyndle Herrlich, MD  [facility]  [facility] [address] [place] [place], [place] [address] Phone: [phone] FAX: [phone]   CC: Mliss Sax, MD [address] [place] [address] Phone: [phone]  Fax: [phone]  Return to Endocrinology clinic as  below: Future Appointments  Date Time Provider Department Center  04/18/2023  9:00 AM Rennis Chris, MD [facility] None  11/21/2023  3:00 PM [facility]-ANNUAL WELLNESS VISIT [facility]

## 2022-11-27 NOTE — Patient Instructions (Signed)

## 2022-11-29 ENCOUNTER — Encounter: Payer: Self-pay | Admitting: Internal Medicine

## 2022-12-10 DIAGNOSIS — E1059 Type 1 diabetes mellitus with other circulatory complications: Secondary | ICD-10-CM | POA: Diagnosis not present

## 2022-12-10 DIAGNOSIS — E1040 Type 1 diabetes mellitus with diabetic neuropathy, unspecified: Secondary | ICD-10-CM | POA: Diagnosis not present

## 2022-12-17 DIAGNOSIS — N184 Chronic kidney disease, stage 4 (severe): Secondary | ICD-10-CM | POA: Diagnosis not present

## 2022-12-17 DIAGNOSIS — Z8673 Personal history of transient ischemic attack (TIA), and cerebral infarction without residual deficits: Secondary | ICD-10-CM | POA: Diagnosis not present

## 2022-12-17 DIAGNOSIS — E1022 Type 1 diabetes mellitus with diabetic chronic kidney disease: Secondary | ICD-10-CM | POA: Diagnosis not present

## 2022-12-17 DIAGNOSIS — I129 Hypertensive chronic kidney disease with stage 1 through stage 4 chronic kidney disease, or unspecified chronic kidney disease: Secondary | ICD-10-CM | POA: Diagnosis not present

## 2022-12-17 DIAGNOSIS — N2581 Secondary hyperparathyroidism of renal origin: Secondary | ICD-10-CM | POA: Diagnosis not present

## 2022-12-26 DIAGNOSIS — E1059 Type 1 diabetes mellitus with other circulatory complications: Secondary | ICD-10-CM | POA: Diagnosis not present

## 2022-12-26 DIAGNOSIS — E1040 Type 1 diabetes mellitus with diabetic neuropathy, unspecified: Secondary | ICD-10-CM | POA: Diagnosis not present

## 2022-12-28 ENCOUNTER — Other Ambulatory Visit: Payer: Self-pay | Admitting: Family Medicine

## 2022-12-28 DIAGNOSIS — K219 Gastro-esophageal reflux disease without esophagitis: Secondary | ICD-10-CM

## 2023-01-10 DIAGNOSIS — E1059 Type 1 diabetes mellitus with other circulatory complications: Secondary | ICD-10-CM | POA: Diagnosis not present

## 2023-01-10 DIAGNOSIS — E1040 Type 1 diabetes mellitus with diabetic neuropathy, unspecified: Secondary | ICD-10-CM | POA: Diagnosis not present

## 2023-01-20 DIAGNOSIS — E875 Hyperkalemia: Secondary | ICD-10-CM | POA: Diagnosis not present

## 2023-02-06 ENCOUNTER — Other Ambulatory Visit: Payer: Self-pay | Admitting: Family Medicine

## 2023-02-06 DIAGNOSIS — Z8673 Personal history of transient ischemic attack (TIA), and cerebral infarction without residual deficits: Secondary | ICD-10-CM

## 2023-02-07 DIAGNOSIS — E109 Type 1 diabetes mellitus without complications: Secondary | ICD-10-CM | POA: Diagnosis not present

## 2023-02-09 DIAGNOSIS — E1059 Type 1 diabetes mellitus with other circulatory complications: Secondary | ICD-10-CM | POA: Diagnosis not present

## 2023-02-09 DIAGNOSIS — E1040 Type 1 diabetes mellitus with diabetic neuropathy, unspecified: Secondary | ICD-10-CM | POA: Diagnosis not present

## 2023-03-12 DIAGNOSIS — E1040 Type 1 diabetes mellitus with diabetic neuropathy, unspecified: Secondary | ICD-10-CM | POA: Diagnosis not present

## 2023-03-12 DIAGNOSIS — E1059 Type 1 diabetes mellitus with other circulatory complications: Secondary | ICD-10-CM | POA: Diagnosis not present

## 2023-03-13 DIAGNOSIS — E109 Type 1 diabetes mellitus without complications: Secondary | ICD-10-CM | POA: Diagnosis not present

## 2023-03-24 ENCOUNTER — Telehealth: Payer: Self-pay

## 2023-03-24 NOTE — Patient Outreach (Signed)
Attempted to contact patient regarding care gaps. Left voicemail for patient to return my call at [phone].  Nicholes Rough, CMA Care Guide [facility] [employer]

## 2023-04-11 DIAGNOSIS — E1059 Type 1 diabetes mellitus with other circulatory complications: Secondary | ICD-10-CM | POA: Diagnosis not present

## 2023-04-11 DIAGNOSIS — E1040 Type 1 diabetes mellitus with diabetic neuropathy, unspecified: Secondary | ICD-10-CM | POA: Diagnosis not present

## 2023-04-14 DIAGNOSIS — E109 Type 1 diabetes mellitus without complications: Secondary | ICD-10-CM | POA: Diagnosis not present

## 2023-04-16 NOTE — Progress Notes (Signed)
[facility] - Clinic Note  04/18/2023     CHIEF COMPLAINT Patient presents for Retina Follow Up   HISTORY OF PRESENT ILLNESS: Adrian Neal is a 57 y.o. male who presents to the clinic today for:   HPI     Retina Follow Up   Patient presents with  Diabetic Retinopathy.  In both eyes.  This started 1 year ago.  Duration of 1 year.  Since onset it is stable.  I, the attending physician,  performed the HPI with the patient and updated documentation appropriately.        Comments   1 year retina follow up PDR OU pt is reporting vision is about the same he denies any flashes or floaters pt last reading 308 now his last A1C was 7.2       Last edited by Rennis Chris, MD on 04/19/2023  2:31 AM.    Pt states he saw someone at [facility] in April and got new glasses, he states his blood sugar was over 300 when checking his vision this morning and he is wondering if that's the reason for his decreased vision today  Referring physician: Shon Millet, MD [address] [place],  [place] [address]  HISTORICAL INFORMATION:   Selected notes from the MEDICAL RECORD NUMBER Referred by Dr. Nadene Rubins for DM exam LEE:  Ocular Hx-cataract OD, pseudo OS PMH-DM (type 1, A1C: 8.6, takes novolog), heart murmur, HLD, HTN, stroke    CURRENT MEDICATIONS: No current outpatient medications on file. (Ophthalmic Drugs)   No current facility-administered medications for this visit. (Ophthalmic Drugs)   Current Outpatient Medications (Other)  Medication Sig   atorvastatin (LIPITOR) 40 MG tablet TAKE 1 TABLET BY MOUTH ONCE DAILY AT  6  PM   carvedilol (COREG) 25 MG tablet Take 1 tablet (25 mg total) by mouth 2 (two) times daily.   chlorthalidone (HYGROTON) 25 MG tablet Take 25 mg by mouth daily.   clopidogrel (PLAVIX) 75 MG tablet TAKE 1 TABLET BY MOUTH ONCE DAILY TAKE  WITH  ASPIRIN  FOR  THREE  MONTHS  THEN  STOP  ASPIRIN   Continuous Blood Gluc Receiver  (DEXCOM G6 RECEIVER) DEVI 1 Device by Does not apply route as directed.   Continuous Glucose Sensor (DEXCOM G6 SENSOR) MISC 3 Devices by Does not apply route as directed.   Continuous Glucose Transmitter (DEXCOM G6 TRANSMITTER) MISC Use to monitor blood sugar. Change every 90 days.   glucose blood (ONETOUCH VERIO) test strip 1 each by Other route 3 (three) times daily. Use as instructed   Insulin Infusion Pump (T:SLIM INSULIN PUMP) DEVI by Does not apply route.   insulin lispro (HUMALOG) 100 UNIT/ML injection Max daily dose of 100 units via pump DX E10.59 (vials)   lisinopril (ZESTRIL) 40 MG tablet Take 40 mg by mouth daily.   methocarbamol (ROBAXIN) 500 MG tablet Take 1 tablet (500 mg total) by mouth every 8 (eight) hours as needed for muscle spasms.   pantoprazole (PROTONIX) 40 MG tablet Take 1 tablet by mouth once daily   spironolactone (ALDACTONE) 25 MG tablet Take 0.5 tablets (12.5 mg total) by mouth daily.   No current facility-administered medications for this visit. (Other)   REVIEW OF SYSTEMS: ROS   Positive for: Neurological, Musculoskeletal, Endocrine, Eyes, Respiratory Negative for: Constitutional, Gastrointestinal, Skin, Genitourinary, HENT, Cardiovascular, Psychiatric, Allergic/Imm, Heme/Lymph Last edited by Etheleen Mayhew, COT on 04/18/2023  8:49 AM.     ALLERGIES  No Known Allergies  PAST MEDICAL HISTORY Past Medical History:  Diagnosis Date   Cataract    Mixed form OS   Chronic kidney disease    Diabetes mellitus without complication (HCC)    diagnosed at age 78   Eye problems    Heart murmur 1996   High cholesterol    patient denies but take preventative medicine   Hypertension    Hypertensive retinopathy    OU   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   Sleep apnea    on BiPAP   Stroke (HCC) 2016   Past Surgical History:  Procedure Laterality Date   ANTERIOR APPROACH HEMI HIP ARTHROPLASTY Left 01/01/2019   Procedure:  ANTERIOR APPROACH total hip ARTHROPLASTY;  Surgeon: Samson Frederic, MD;  Location: [facility] OR;  Service: Orthopedics;  Laterality: Left;   CATARACT EXTRACTION Right 1994   CATARACT EXTRACTION W/ INTRAOCULAR LENS IMPLANT  1994   EYE SURGERY Right 1991   vitrectomy   EYE SURGERY Right 1992   scar tissue removed from retina   EYE SURGERY Right 1994   Cat Sx   FAMILY HISTORY Family History  Problem Relation Age of Onset   Hypertension Mother    Hyperlipidemia Mother    Hyperlipidemia Father    Hypertension Father    Diabetes Father    Kidney disease Father        had a kidney transplant    Stroke Brother    Diabetes Brother    Pulmonary fibrosis Paternal Grandmother    Lung cancer Paternal Grandfather    Diabetes Daughter    Colon cancer Neg Hx    Esophageal cancer Neg Hx    Rectal cancer Neg Hx    Stomach cancer Neg Hx    SOCIAL HISTORY Social History   Tobacco Use   Smoking status: Former    Current packs/day: 0.00    Average packs/day: 1 pack/day for 6.0 years (6.0 ttl pk-yrs)    Types: Cigarettes    Start date: 04/29/1985    Quit date: 04/30/1991    Years since quitting: 31.9   Smokeless tobacco: Never  Vaping Use   Vaping status: Never Used  Substance Use Topics   Alcohol use: No   Drug use: Never       OPHTHALMIC EXAM:  Base Eye Exam     Visual Acuity (Snellen - Linear)       Right Left   Dist cc 20/70 -2 20/50   Dist ph cc NI 20/40 -2    Correction: Glasses         Tonometry (Tonopen, 8:56 AM)       Right Left   Pressure 14 12         Pupils       Pupils Dark Light Shape React APD   Right PERRL 3 2 Round Brisk None   Left PERRL 3 2 Round Brisk None         Visual Fields       Left Right    Full Full         Extraocular Movement       Right Left    Full, Ortho Full, Ortho         Neuro/Psych     Oriented x3: Yes   Mood/Affect: Normal         Dilation     Both eyes: 2.5% Phenylephrine @ 8:56 AM  Slit  Lamp and Fundus Exam     Slit Lamp Exam       Right Left   Lids/Lashes dermatochalasis dermatochalasis   Conjunctiva/Sclera White and quiet White and quiet   Cornea fine endo pigment, focal band K at 3:00, well healed temporal cataract wounds, mild arcus, mild tear film debris tear film debris   Anterior Chamber deep and clear deep, narrow angles   Iris Round, poorly dilated, no NVI round, moderately dilated, no NVI, mild anterior bowing   Lens PC IOL in good position with open PC 2-3+Nuclear sclerosis with mild brunescence, 2-3+ Cortical cataract   Anterior Vitreous post vitrectomy, vitreous condensations vitreous syneresis         Fundus Exam       Right Left   Disc +cupping, mild Pallor, fibrosis at 4:30, peripapillary CR scarring at 9:00, Sharp rim Pink and Sharp, trace fibrosis, Compact   C/D Ratio 0.7 0.2   Macula Flat, Blunted foveal reflex, Retinal pigment epithelial mottling, Atrophy, focal peripapillary pigment clump nasal macula, No heme or edema Flat, blunted foveal reflex, mild RPE mottling, trace Epiretinal membrane, No heme or edema   Vessels Severe attenuation, Tortuous, copper wiring attenuated, Tortuous   Periphery Attached, 360 PRP, No heme attached, 360 PRP to arcades, No heme           Refraction     Wearing Rx       Sphere Cylinder Axis Add   Right +2.25 +0.75 088 +2.50   Left +1.50 +0.75 092 +2.50    Type: PAL           IMAGING AND PROCEDURES  Imaging and Procedures for @TODAY @  OCT, Retina - OU - Both Eyes       Right Eye Quality was good. Central Foveal Thickness: 201. Progression has been stable. Findings include normal foveal contour, no IRF, no SRF, retinal drusen , epiretinal membrane, outer retinal atrophy (Patchy ORA, irregular lamination, no DME, mild ERM -- Stable from prior, interval improvement in vitreous opacities).   Left Eye Quality was good. Central Foveal Thickness: 267. Progression has been stable. Findings include  normal foveal contour, no IRF, no SRF, epiretinal membrane, vitreomacular adhesion (no DME ).   Notes *Images captured and stored on drive  Diagnosis / Impression:  Hx of PDR OU No DME OU ERM OU (OD>OS)   Clinical management:  See below  Abbreviations: NFP - Normal foveal profile. CME - cystoid macular edema. PED - pigment epithelial detachment. IRF - intraretinal fluid. SRF - subretinal fluid. EZ - ellipsoid zone. ERM - epiretinal membrane. ORA - outer retinal atrophy. ORT - outer retinal tubulation. SRHM - subretinal hyper-reflective material            ASSESSMENT/PLAN:    ICD-10-CM   1. 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 Essential hypertension  I10     3. Hypertensive retinopathy of both eyes  H35.033     4. Pseudophakia  Z96.1     5. Combined forms of age-related cataract of left eye  H25.812      1. DM1 w/ Proliferative diabetic retinopathy w/o DME, OU  - last A1c: 6.8 on 09.08.22  - history of extensive retinal work done in [place], [place] in 90s per pt -- including PPV w/ laser OD  - saw Dr. Ashley Royalty once in 2013 and then pt lost  insurance and has not had regular eye care since  - exam shows extensive PRP and regressed fibrosis/NV OD -- no significant edema  - good PRP in place OU  - FA (3.17.2021) shows scattered MA, but no active NV  - OCT without diabetic macular edema, both eyes   - BCVA 20/70 OD (decreased from 20/50); 20/40 OS (decreased from 20/30)  - OD likely affected by retinal ischemia vs ischemic optic atrophy   - discussed findings and prognosis  - no intervention indicated at this time  - recommend monitoring for now  - f/u in 1 year, sooner prn -- DFE/OCT  2,3. Hypertensive retinopathy OU  - discussed importance of tight BP control  - monitor  4. Pseudophakia OD  - s/p CE/IOL OD by surgeon at [facility] [phone]  -  doing well  - monitor   5. Mixed Cataract OS  - The symptoms of cataract, surgical options, and treatments and risks were discussed with patient.  - discussed diagnosis and progression  - approaching visual significance -- complains of glare symptoms when driving  - under the expert management of [facility] - clear from a retina standpoint to proceed with cataract surgery when pt and surgeon are ready    Ophthalmic Meds Ordered this visit:  No orders of the defined types were placed in this encounter.    Return in about 1 year (around 04/17/2024) for f/u PDR OU, DFE, OCT.  There are no Patient Instructions on file for this visit.   This document serves as a record of services personally performed by Karie Chimera, MD, PhD. It was created on their behalf by Berlin Hun COT, an ophthalmic technician. The creation of this record is the provider's dictation and/or activities during the visit.    Electronically signed by: Berlin Hun COT 12.20.24 2:31 AM  This document serves as a record of services personally performed by Karie Chimera, MD, PhD. It was created on their behalf by Glee Arvin. Manson Passey, OA an ophthalmic technician. The creation of this record is the provider's dictation and/or activities during the visit.    Electronically signed by: Glee Arvin. Manson Passey, OA 04/19/23 2:31 AM  Karie Chimera, M.D., Ph.D. Diseases & Surgery of the Retina and Vitreous [facility] 04/18/2023   I have reviewed the above documentation for accuracy and completeness, and I agree with the above. Karie Chimera, M.D., Ph.D. 04/19/23 2:33 AM   Abbreviations: M myopia (nearsighted); A astigmatism; H hyperopia (farsighted); P presbyopia; Mrx spectacle prescription;  CTL contact lenses; OD right eye; OS left eye; OU both eyes  XT exotropia; ET esotropia; PEK punctate epithelial keratitis; PEE punctate epithelial erosions; DES dry eye syndrome; MGD  meibomian gland dysfunction; ATs artificial tears; PFAT's preservative free artificial tears; NSC nuclear sclerotic cataract; PSC posterior subcapsular cataract; ERM epi-retinal membrane; PVD posterior vitreous detachment; RD retinal detachment; DM diabetes mellitus; DR diabetic retinopathy; NPDR non-proliferative diabetic retinopathy; PDR proliferative diabetic retinopathy; CSME clinically significant macular edema; DME diabetic macular edema; dbh dot blot hemorrhages; CWS cotton wool spot; POAG primary open angle glaucoma; C/D cup-to-disc ratio; HVF humphrey visual field; GVF goldmann visual field; OCT optical coherence tomography; IOP intraocular pressure; BRVO Branch retinal vein occlusion; CRVO central retinal vein occlusion; CRAO central retinal artery occlusion; BRAO branch retinal artery occlusion; RT retinal tear; SB scleral buckle; PPV pars plana vitrectomy; VH Vitreous hemorrhage; PRP panretinal laser photocoagulation; IVK intravitreal kenalog; VMT vitreomacular traction; MH Macular hole;  NVD neovascularization of the disc; NVE neovascularization elsewhere; AREDS age related eye disease study; ARMD age related macular degeneration; POAG primary open angle glaucoma; EBMD epithelial/anterior basement membrane dystrophy; ACIOL anterior chamber intraocular lens; IOL intraocular lens; PCIOL posterior chamber intraocular lens; Phaco/IOL phacoemulsification with intraocular lens placement; PRK photorefractive keratectomy; LASIK laser assisted in situ keratomileusis; HTN hypertension; DM diabetes mellitus; COPD chronic obstructive pulmonary disease

## 2023-04-18 ENCOUNTER — Encounter (INDEPENDENT_AMBULATORY_CARE_PROVIDER_SITE_OTHER): Payer: Self-pay | Admitting: Ophthalmology

## 2023-04-18 ENCOUNTER — Ambulatory Visit (INDEPENDENT_AMBULATORY_CARE_PROVIDER_SITE_OTHER): Payer: PPO | Admitting: Ophthalmology

## 2023-04-18 DIAGNOSIS — E103593 Type 1 diabetes mellitus with proliferative diabetic retinopathy without macular edema, bilateral: Secondary | ICD-10-CM | POA: Diagnosis not present

## 2023-04-18 DIAGNOSIS — H35033 Hypertensive retinopathy, bilateral: Secondary | ICD-10-CM | POA: Diagnosis not present

## 2023-04-18 DIAGNOSIS — Z961 Presence of intraocular lens: Secondary | ICD-10-CM

## 2023-04-18 DIAGNOSIS — I10 Essential (primary) hypertension: Secondary | ICD-10-CM

## 2023-04-18 DIAGNOSIS — H25812 Combined forms of age-related cataract, left eye: Secondary | ICD-10-CM

## 2023-04-18 LAB — HM DIABETES EYE EXAM

## 2023-04-19 ENCOUNTER — Encounter (INDEPENDENT_AMBULATORY_CARE_PROVIDER_SITE_OTHER): Payer: Self-pay | Admitting: Ophthalmology

## 2023-04-25 ENCOUNTER — Encounter: Payer: Self-pay | Admitting: Internal Medicine

## 2023-05-02 ENCOUNTER — Encounter: Payer: Self-pay | Admitting: Family Medicine

## 2023-05-12 DIAGNOSIS — E1040 Type 1 diabetes mellitus with diabetic neuropathy, unspecified: Secondary | ICD-10-CM | POA: Diagnosis not present

## 2023-05-12 DIAGNOSIS — E1059 Type 1 diabetes mellitus with other circulatory complications: Secondary | ICD-10-CM | POA: Diagnosis not present

## 2023-05-14 DIAGNOSIS — E109 Type 1 diabetes mellitus without complications: Secondary | ICD-10-CM | POA: Diagnosis not present

## 2023-05-24 ENCOUNTER — Encounter: Payer: Self-pay | Admitting: Internal Medicine

## 2023-05-25 ENCOUNTER — Other Ambulatory Visit: Payer: Self-pay | Admitting: Internal Medicine

## 2023-05-30 ENCOUNTER — Ambulatory Visit: Payer: PPO | Admitting: Internal Medicine

## 2023-05-30 NOTE — Progress Notes (Deleted)
 Name: Adrian Neal  Age/ Sex: 58 y.o., male   MRN/ DOB: [id number], 1966/04/07     PCP: Mliss Sax, MD   Reason for Endocrinology Evaluation: Type 1 Diabetes Mellitus  Initial Endocrine Consultative Visit: 06/18/2018    PATIENT IDENTIFIER: Adrian Neal is a 58 y.o. male with a past medical history of .HTN,Hyperlipidemia and Hx of CVA  The patient has followed with Endocrinology clinic since 06/18/2018 for consultative assistance with management of his diabetes.  DIABETIC HISTORY:  Mr. Hornstein was diagnosed with T1DM at age 32, he has been on insulin pump since 2012. His hemoglobin A1c has ranged from 7.2% , peaking at 9.0%   In 11/2018 he switched to the T-Slim   SUBJECTIVE:   During the last visit (11/27/2022): A1c 7.1 %    Today (05/30/2023): Mr. Braver is here for a follow up on diabetes management and his new pump.  He checks his blood sugars multiple times daily, preprandial and bedtime. The patient has had hypoglycemic episodes since the last clinic visit, which typically occur 2 x /week - most often occuring during the day after meals. The patient is symptomatic with these episodes.  Is seeing nephrology with CKD IV    He presented to the ED 08/2022 with anorexia and fatigue was noted with hyperglycemia, AKI, and transaminitis, his atorvastatin, chlorthalidone, spironolactone were discontinued  He was diagnosed with COVID 10/30/2022 , felt like a cold  Denies constipation or diarrhea    This patient with type 1 diabetes is treated with humalog (insulin pump). The clinical list was updated.     Current pump settings      Pump   T-Slim      Insulin type   HUMALOG    Basal rate       0000   1.2 u/h    0600 1.0u/h   1100 0.9      I:C ratio   0000 1:8   0600 1:5   1100 1:7      Sensitivity      0000 30      AIT      0000  5      Goal       0000  110         Type & Model of Pump:T-Slim  Insulin Type: Currently using  Humalog   PUMP STATISTICS:  7/18-7/31/2024 Average BG Dashboard : 178 Average daily Carbs 197 Average Total Daily Insulin:  78.13 u/day  Average Daily Basal:22(31 %) Average Daily Bolus: 50 (69 %)   CONTINUOUS GLUCOSE MONITORING RECORD INTERPRETATION    Dates of Recording: 7/18-7/31/2024  Sensor description:dexcom  Results statistics:   CGM use % of time 91  Average and SD 178/41  Time in range 8   %  % Time Above 180 90  % Time Below target 3   Glycemic patterns summary: BG's optimal during the night and fluctuate during the day  Hyperglycemic episodes  postprandial   Hypoglycemic episodes occurred n/a  Overnight periods: optimal      Statin: yes ACE-I/ARB: yes      DIABETIC COMPLICATIONS: Microvascular complications:  CKD IV, 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  Denies: Neuropathy  Last eye exam: Completed 01/2022   Macrovascular complications:  CVA Denies: CAD, PVD       HISTORY:  Past Medical History:  Past Medical History:  Diagnosis Date   Cataract    Mixed form OS   Chronic kidney disease  Diabetes mellitus without complication (HCC)    diagnosed at age 51   Eye problems    Heart murmur 1996   High cholesterol    patient denies but take preventative medicine   Hypertension    Hypertensive retinopathy    OU   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   Sleep apnea    on BiPAP   Stroke (HCC) 2016   Past Surgical History:  Past Surgical History:  Procedure Laterality Date   ANTERIOR APPROACH HEMI HIP ARTHROPLASTY Left 01/01/2019   Procedure: ANTERIOR APPROACH total hip ARTHROPLASTY;  Surgeon: Samson Frederic, MD;  Location: [facility] OR;  Service: Orthopedics;  Laterality: Left;   CATARACT EXTRACTION Right 1994   CATARACT EXTRACTION W/ INTRAOCULAR LENS IMPLANT  1994   EYE SURGERY Right 1991   vitrectomy   EYE SURGERY Right 1992   scar tissue removed from retina   EYE SURGERY Right 1994   Cat Sx   Social History:  reports that he  quit smoking about 32 years ago. His smoking use included cigarettes. He started smoking about 38 years ago. He has a 6 pack-year smoking history. He has never used smokeless tobacco. He reports that he does not drink alcohol and does not use drugs. Family History:  Family History  Problem Relation Age of Onset   Hypertension Mother    Hyperlipidemia Mother    Hyperlipidemia Father    Hypertension Father    Diabetes Father    Kidney disease Father        had a kidney transplant    Stroke Brother    Diabetes Brother    Pulmonary fibrosis Paternal Grandmother    Lung cancer Paternal Grandfather    Diabetes Daughter    Colon cancer Neg Hx    Esophageal cancer Neg Hx    Rectal cancer Neg Hx    Stomach cancer Neg Hx     HOME MEDICATIONS: Allergies as of 05/30/2023   No Known Allergies      Medication List        Accurate as of May 30, 2023  7:01 AM. If you have any questions, ask your nurse or doctor.          atorvastatin 40 MG tablet Commonly known as: LIPITOR TAKE 1 TABLET BY MOUTH ONCE DAILY AT  6  PM   carvedilol 25 MG tablet Commonly known as: COREG Take 1 tablet (25 mg total) by mouth 2 (two) times daily.   chlorthalidone 25 MG tablet Commonly known as: HYGROTON Take 25 mg by mouth daily.   clopidogrel 75 MG tablet Commonly known as: PLAVIX TAKE 1 TABLET BY MOUTH ONCE DAILY TAKE  WITH  ASPIRIN  FOR  THREE  MONTHS  THEN  STOP  ASPIRIN   Dexcom G6 Receiver Devi 1 Device by Does not apply route as directed.   Dexcom G6 Sensor Misc 3 Devices by Does not apply route as directed.   Dexcom G6 Transmitter Misc Use to monitor blood sugar. Change every 90 days.   insulin lispro 100 UNIT/ML injection Commonly known as: HumaLOG Max daily dose of 100 units via pump DX E10.59 (vials)   HumaLOG 100 UNIT/ML injection Generic drug: insulin lispro INJECT UNDER THE SKIN DAILY VIA PUMP. MAX DOSE PER DAY: 100 UNITS   lisinopril 40 MG tablet Commonly known as:  ZESTRIL Take 40 mg by mouth daily.   methocarbamol 500 MG tablet Commonly known as: ROBAXIN Take 1 tablet (500  mg total) by mouth every 8 (eight) hours as needed for muscle spasms.   OneTouch Verio test strip Generic drug: glucose blood 1 each by Other route 3 (three) times daily. Use as instructed   pantoprazole 40 MG tablet Commonly known as: PROTONIX Take 1 tablet by mouth once daily   spironolactone 25 MG tablet Commonly known as: ALDACTONE Take 0.5 tablets (12.5 mg total) by mouth daily.   T:slim Insulin Pump Devi by Does not apply route.       PHYSICAL EXAM: VS: There were no vitals taken for this visit.   EXAM: General: Pt appears well and is in NAD  Lungs: Clear with good BS bilat  Heart: Auscultation: RRR   Extremities:  BL LE: no pretibial edema   Mental Status: Judgment, insight: intact Orientation: oriented to time, place, and person Mood and affect: no depression, anxiety, or agitation   DM Foot Exam 05/29/2022  The skin of the feet is without sores or ulcerations, toe nails dystrophic The pedal pulses are 1+ on right and 1+ on left. The sensation is intact  to a screening 5.07, 10 gram monofilament bilaterally      DATA REVIEWED:  Lab Results  Component Value Date   HGBA1C 7.0 (A) 11/27/2022   HGBA1C 7.2 (A) 05/29/2022   HGBA1C 7.2 (A) 07/06/2021    Latest Reference Range & Units 10/30/22 09:37  Sodium 135 - 145 mEq/L 136  Potassium 3.5 - 5.1 mEq/L 4.9  Chloride 96 - 112 mEq/L 109  CO2 19 - 32 mEq/L 21  Glucose 70 - 99 mg/dL 578 (H)  BUN 6 - 23 mg/dL 61 (H)  Creatinine 4.69 - 1.50 mg/dL 6.29 (H)  Calcium 8.4 - 10.5 mg/dL 8.9  Alkaline Phosphatase 39 - 117 U/L 102  Albumin 3.5 - 5.2 g/dL 3.8  AST 0 - 37 U/L 18  ALT 0 - 53 U/L 25  Total Protein 6.0 - 8.3 g/dL 6.8  Bilirubin, Direct 0.0 - 0.3 mg/dL 0.1  Total Bilirubin 0.2 - 1.2 mg/dL 0.4  GFR >52.84 mL/min 24.18 (L)    Latest Reference Range & Units 10/30/22 09:37  Direct LDL mg/dL  13.2    Old records , labs and images have been reviewed.    ASSESSMENT / PLAN / RECOMMENDATIONS:   1) Type 1 Diabetes Mellitus, Optimally controlled, With CKD IV, retinopathic and  macrovascular complications - Most recent A1c of 7.0 %. Goal A1c < 7.5 %.   - His A1c remains  stable  - In reviewing CGM/pump download, patient has been noted with glycemia at bedtime that continues throughout the night -I have encouraged the patient to continue to bolus for CHO consumed at bedtime as well -I will increase basal rate at midnight   MEDICATIONS:  Pump   T-Slim      Insulin type   HUMALOG    Basal rate       0000   1.2 u/h    0600 1.0u/h   1100 0.9      I:C ratio   0000 1:8   0600 1:5   1100 1:7      Sensitivity      0000 30      AIT      0000  5      Goal       0000  110      EDUCATION / INSTRUCTIONS: BG monitoring instructions: Patient is instructed to check his blood sugars 4 times a day, before meals  and bedtime. Call [facility] if: BG persistently < 70  I reviewed the Rule of 15 for the treatment of hypoglycemia in detail with the patient. Literature supplied.    F/U in 6 months       Signed electronically by: Lyndle Herrlich, MD  [facility] Endocrinology  [facility] [address] [place] [place], [place] [address] Phone: [phone] FAX: [phone]   CC: Mliss Sax, MD [address] [place] [address] Phone: [phone]  Fax: [phone]  Return to Endocrinology clinic as below: Future Appointments  Date Time Provider Department Center  05/30/2023 10:10 AM Staci Dack, Konrad Dolores, MD [facility] None  11/21/2023  3:00 PM [facility]-ANNUAL WELLNESS VISIT [facility]  04/16/2024  9:00 AM Rennis Chris, MD [facility] None

## 2023-06-12 DIAGNOSIS — E1040 Type 1 diabetes mellitus with diabetic neuropathy, unspecified: Secondary | ICD-10-CM | POA: Diagnosis not present

## 2023-06-12 DIAGNOSIS — E1059 Type 1 diabetes mellitus with other circulatory complications: Secondary | ICD-10-CM | POA: Diagnosis not present

## 2023-06-16 ENCOUNTER — Other Ambulatory Visit: Payer: Self-pay | Admitting: Family Medicine

## 2023-06-16 DIAGNOSIS — I10 Essential (primary) hypertension: Secondary | ICD-10-CM

## 2023-06-17 ENCOUNTER — Encounter: Payer: Self-pay | Admitting: Family Medicine

## 2023-06-18 ENCOUNTER — Other Ambulatory Visit: Payer: Self-pay

## 2023-06-18 DIAGNOSIS — E782 Mixed hyperlipidemia: Secondary | ICD-10-CM

## 2023-06-18 MED ORDER — ATORVASTATIN CALCIUM 40 MG PO TABS: ORAL_TABLET | ORAL | 3 refills | Status: AC

## 2023-06-19 ENCOUNTER — Other Ambulatory Visit: Payer: Self-pay | Admitting: Family Medicine

## 2023-06-19 DIAGNOSIS — I10 Essential (primary) hypertension: Secondary | ICD-10-CM

## 2023-06-24 ENCOUNTER — Ambulatory Visit (INDEPENDENT_AMBULATORY_CARE_PROVIDER_SITE_OTHER): Payer: PPO | Admitting: Internal Medicine

## 2023-06-24 ENCOUNTER — Encounter: Payer: Self-pay | Admitting: Internal Medicine

## 2023-06-24 ENCOUNTER — Other Ambulatory Visit: Payer: Self-pay | Admitting: Family Medicine

## 2023-06-24 VITALS — BP 126/72 | HR 91 | Ht 67.0 in | Wt 191.0 lb

## 2023-06-24 DIAGNOSIS — E1022 Type 1 diabetes mellitus with diabetic chronic kidney disease: Secondary | ICD-10-CM

## 2023-06-24 DIAGNOSIS — N184 Chronic kidney disease, stage 4 (severe): Secondary | ICD-10-CM | POA: Diagnosis not present

## 2023-06-24 DIAGNOSIS — I10 Essential (primary) hypertension: Secondary | ICD-10-CM

## 2023-06-24 DIAGNOSIS — E10319 Type 1 diabetes mellitus with unspecified diabetic retinopathy without macular edema: Secondary | ICD-10-CM

## 2023-06-24 DIAGNOSIS — E1059 Type 1 diabetes mellitus with other circulatory complications: Secondary | ICD-10-CM

## 2023-06-24 LAB — POCT GLYCOSYLATED HEMOGLOBIN (HGB A1C): Hemoglobin A1C: 7.1 % — AB (ref 4.0–5.6)

## 2023-06-24 MED ORDER — ONETOUCH VERIO FLEX SYSTEM W/DEVICE KIT: 1.0000 | PACK | Freq: Four times a day (QID) | 0 refills | Status: AC

## 2023-06-24 MED ORDER — FREESTYLE LIBRE 2 PLUS SENSOR MISC: 1.0000 | 3 refills | Status: AC

## 2023-06-24 NOTE — Patient Instructions (Signed)

## 2023-06-24 NOTE — Progress Notes (Signed)
 Name: Adrian Neal  Age/ Sex: 58 y.o., male   MRN/ DOB: [id number], April 14, 1966     PCP: Mliss Sax, MD   Reason for Endocrinology Evaluation: Type 1 Diabetes Mellitus  Initial Endocrine Consultative Visit: 06/18/2018    PATIENT IDENTIFIER: Adrian Neal is a 58 y.o. male with a past medical history of .HTN,Hyperlipidemia and Hx of CVA  The patient has followed with Endocrinology clinic since 06/18/2018 for consultative assistance with management of his diabetes.  DIABETIC HISTORY:  Adrian Neal was diagnosed with T1DM at age 50, he has been on insulin pump since 2012. His hemoglobin A1c has ranged from 7.2% , peaking at 9.0%   In 11/2018 he switched to the T-Slim   SUBJECTIVE:   During the last visit (11/27/2022): A1c 7.1 %    Today (06/24/2023): Adrian Neal is here for a follow up on diabetes management and his new pump.  He checks his blood sugars multiple times daily, preprandial and bedtime. The patient has  nothad hypoglycemic episodes since the last clinic visit, which typically occur 2 x /week - most often occuring during the day after meals. The patient is symptomatic with these episodes.  Is seeing nephrology with CKD IV ( Dr. Valentino Nose' )  Denies nausea or vomiting  Denies constipation or diarrhea    This patient with type 1 diabetes is treated with humalog (insulin pump). The clinical list was updated.     Current pump settings      Pump   T-Slim      Insulin type   HUMALOG    Basal rate       0000   0.900 u/h    0600 1.0u/h   1100 1.0      I:C ratio   0000 1:7   0600 1:5   1100 1:7      Sensitivity      0000 30      AIT      0000  5      Goal       0000  110       Type & Model of Pump:T-Slim  Insulin Type: Currently using Humalog   PUMP STATISTICS:   Average BG Dashboard : 177 Average daily Carbs 201 g Average Total Daily Insulin:  85.57 u/day  Average Daily Basal:27.48 (32 %) Average Daily Bolus: 58.10  (68 %)   CONTINUOUS GLUCOSE MONITORING RECORD INTERPRETATION    Dates of Recording:1/29-2/25/2025  Sensor description:dexcom  Results statistics:   CGM use % of time 94  Average and SD 177/49  Time in range 63 %  % Time Above 180 28  % Time Below target 8.8   Glycemic patterns summary: BGs are optimal overnight and in between the meals  Hyperglycemic episodes  postprandial   Hypoglycemic episodes occurred n/a  Overnight periods: optimal      Statin: yes ACE-I/ARB: yes      DIABETIC COMPLICATIONS: Microvascular complications:  CKD IV, 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  Denies: Neuropathy  Last eye exam: Completed 01/2022   Macrovascular complications:  CVA Denies: CAD, PVD       HISTORY:  Past Medical History:  Past Medical History:  Diagnosis Date   Cataract    Mixed form OS   Chronic kidney disease    Diabetes mellitus without complication (HCC)    diagnosed at age 73   Eye problems    Heart murmur 1996   High cholesterol    patient  denies but take preventative medicine   Hypertension    Hypertensive retinopathy    OU   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   Sleep apnea    on BiPAP   Stroke (HCC) 2016   Past Surgical History:  Past Surgical History:  Procedure Laterality Date   ANTERIOR APPROACH HEMI HIP ARTHROPLASTY Left 01/01/2019   Procedure: ANTERIOR APPROACH total hip ARTHROPLASTY;  Surgeon: Samson Frederic, MD;  Location: [facility] OR;  Service: Orthopedics;  Laterality: Left;   CATARACT EXTRACTION Right 1994   CATARACT EXTRACTION W/ INTRAOCULAR LENS IMPLANT  1994   EYE SURGERY Right 1991   vitrectomy   EYE SURGERY Right 1992   scar tissue removed from retina   EYE SURGERY Right 1994   Cat Sx   Social History:  reports that he quit smoking about 32 years ago. His smoking use included cigarettes. He started smoking about 38 years ago. He has a 6 pack-year smoking history. He has never used smokeless tobacco. He reports that he does  not drink alcohol and does not use drugs. Family History:  Family History  Problem Relation Age of Onset   Hypertension Mother    Hyperlipidemia Mother    Hyperlipidemia Father    Hypertension Father    Diabetes Father    Kidney disease Father        had a kidney transplant    Stroke Brother    Diabetes Brother    Pulmonary fibrosis Paternal Grandmother    Lung cancer Paternal Grandfather    Diabetes Daughter    Colon cancer Neg Hx    Esophageal cancer Neg Hx    Rectal cancer Neg Hx    Stomach cancer Neg Hx     HOME MEDICATIONS: Allergies as of 06/24/2023   No Known Allergies      Medication List        Accurate as of June 24, 2023 12:40 PM. If you have any questions, ask your nurse or doctor.          STOP taking these medications    amLODipine 10 MG tablet Commonly known as: NORVASC Stopped by: Scarlette Shorts   Dexcom G6 Receiver Devi Stopped by: Johnney Ou Kanoelani Dobies   Dexcom G6 Transmitter Misc Stopped by: Johnney Ou Ellen Mayol       TAKE these medications    atorvastatin 40 MG tablet Commonly known as: LIPITOR TAKE 1 TABLET BY MOUTH ONCE DAILY AT  6  PM   carvedilol 25 MG tablet Commonly known as: COREG Take 1 tablet (25 mg total) by mouth 2 (two) times daily.   chlorthalidone 25 MG tablet Commonly known as: HYGROTON Take 25 mg by mouth daily.   clopidogrel 75 MG tablet Commonly known as: PLAVIX TAKE 1 TABLET BY MOUTH ONCE DAILY TAKE  WITH  ASPIRIN  FOR  THREE  MONTHS  THEN  STOP  ASPIRIN   FreeStyle Libre 2 Plus Sensor Misc 1 Device by Does not apply route every 14 (fourteen) days. What changed:  how much to take when to take this Changed by: Johnney Ou Haadi Santellan   hydrALAZINE 10 MG tablet Commonly known as: APRESOLINE Take 1 tablet twice a day by oral route.   insulin lispro 100 UNIT/ML injection Commonly known as: HumaLOG Max daily dose of 100 units via pump DX E10.59 (vials)   HumaLOG 100 UNIT/ML injection Generic  drug: insulin lispro INJECT UNDER THE SKIN DAILY VIA PUMP. MAX DOSE PER DAY:  100 UNITS   lisinopril 40 MG tablet Commonly known as: ZESTRIL Take 40 mg by mouth daily. What changed: Another medication with the same name was removed. Continue taking this medication, and follow the directions you see here. Changed by: Johnney Ou Sovereign Ramiro   methocarbamol 500 MG tablet Commonly known as: ROBAXIN Take 1 tablet (500 mg total) by mouth every 8 (eight) hours as needed for muscle spasms.   OneTouch Verio Flex System w/Device Kit 1 Device by Does not apply route in the morning, at noon, in the evening, and at bedtime. Started by: Johnney Ou Cionna Collantes   OneTouch Verio test strip Generic drug: glucose blood 1 each by Other route 3 (three) times daily. Use as instructed   pantoprazole 40 MG tablet Commonly known as: PROTONIX Take 1 tablet by mouth once daily What changed: Another medication with the same name was removed. Continue taking this medication, and follow the directions you see here. Changed by: Johnney Ou Renne Cornick   sodium bicarbonate 650 MG tablet Take 650 mg by mouth 2 (two) times daily.   spironolactone 25 MG tablet Commonly known as: ALDACTONE Take 0.5 tablets (12.5 mg total) by mouth daily.   T:slim Insulin Pump Devi by Does not apply route.       PHYSICAL EXAM: VS: BP 126/72 (BP Location: Right Arm, Patient Position: Sitting, Cuff Size: Normal)   Pulse 91   Ht 5\' 7"  (1.702 m)   Wt 191 lb (86.6 kg)   SpO2 99%   BMI 29.91 kg/m    EXAM: General: Pt appears well and is in NAD  Lungs: Clear with good BS bilat  Heart: Auscultation: RRR   Extremities:  BL LE: no pretibial edema   Mental Status: Judgment, insight: intact Orientation: oriented to time, place, and person Mood and affect: no depression, anxiety, or agitation   DM Foot Exam 06/24/2023  The skin of the feet is without sores or ulcerations, toe nails dystrophic The pedal pulses are 1+ on right and  1+ on left. The sensation is intact  to a screening 5.07, 10 gram monofilament bilaterally      DATA REVIEWED:  Lab Results  Component Value Date   HGBA1C 7.1 (A) 06/24/2023   HGBA1C 7.0 (A) 11/27/2022   HGBA1C 7.2 (A) 05/29/2022    Latest Reference Range & Units 10/30/22 09:37  Sodium 135 - 145 mEq/L 136  Potassium 3.5 - 5.1 mEq/L 4.9  Chloride 96 - 112 mEq/L 109  CO2 19 - 32 mEq/L 21  Glucose 70 - 99 mg/dL 454 (H)  BUN 6 - 23 mg/dL 61 (H)  Creatinine 0.98 - 1.50 mg/dL 1.19 (H)  Calcium 8.4 - 10.5 mg/dL 8.9  Alkaline Phosphatase 39 - 117 U/L 102  Albumin 3.5 - 5.2 g/dL 3.8  AST 0 - 37 U/L 18  ALT 0 - 53 U/L 25  Total Protein 6.0 - 8.3 g/dL 6.8  Bilirubin, Direct 0.0 - 0.3 mg/dL 0.1  Total Bilirubin 0.2 - 1.2 mg/dL 0.4  GFR >14.78 mL/min 24.18 (L)    Latest Reference Range & Units 10/30/22 09:37  Direct LDL mg/dL 29.5    Old records , labs and images have been reviewed.    ASSESSMENT / PLAN / RECOMMENDATIONS:   1) Type 1 Diabetes Mellitus, Optimally controlled, With CKD IV, retinopathic and  macrovascular complications - Most recent A1c of 7.1 %. Goal A1c < 7.5 %.   - His A1c remains  stable  - Would like to switch to freestyle  libre 2+ , as he is having difficulty obtaining dexcom through the pharmacy  -Patient has been noted with postprandial hyperglycemia, patient states that he enters his carbohydrates after the meal rather than before the meal.  Patient states that if he enters the carbohydrates before the meal he ends with tight to low BGs, I did advise the patient that we will need to adjust his insulin to carb ratio but he should be entering his carbohydrates before the meals rather than after. -Patient would like to remain on the current settings of the pump, he will enter carbohydrates before the meal and will update [place] on any changes that are needed -A prescription for One Touch Verio flex device was also sent per his request   MEDICATIONS:    Pump    T-Slim      Insulin type   HUMALOG    Basal rate       0000   0.900 u/h    0600 1.0u/h   1100 1.0      I:C ratio   0000 1:7   0600 1:5   1100 1:7      Sensitivity      0000 30      AIT      0000  5      Goal       0000  110      EDUCATION / INSTRUCTIONS: BG monitoring instructions: Patient is instructed to check his blood sugars 4 times a day, before meals and bedtime. Call [facility] if: BG persistently < 70  I reviewed the Rule of 15 for the treatment of hypoglycemia in detail with the patient. Literature supplied.    F/U in 6 months     I spent 25 minutes preparing to see the patient by review of recent labs, imaging and procedures, obtaining and reviewing separately obtained history, communicating with the patient, ordering medications, tests or procedures, and documenting clinical information in the EHR including the differential Dx, treatment, and any further evaluation and other management    Signed electronically by: Lyndle Herrlich, MD  [facility]  [facility] [address] [place], [place] [address] Phone: [phone] FAX: [phone]   CC: Mliss Sax, MD [address] [place] [address] Phone: [phone]  Fax: [phone]  Return to Endocrinology clinic as below: Future Appointments  Date Time Provider Department Center  11/21/2023  3:00 PM [facility]ANNUAL WELLNESS VISIT [facility]  12/23/2023  9:10 AM Shelly Spenser, Konrad Dolores, MD [facility] None  04/16/2024  9:00 AM Rennis Chris, MD [facility] None

## 2023-07-03 DIAGNOSIS — E1040 Type 1 diabetes mellitus with diabetic neuropathy, unspecified: Secondary | ICD-10-CM | POA: Diagnosis not present

## 2023-07-07 DIAGNOSIS — E109 Type 1 diabetes mellitus without complications: Secondary | ICD-10-CM | POA: Diagnosis not present

## 2023-07-10 DIAGNOSIS — E1059 Type 1 diabetes mellitus with other circulatory complications: Secondary | ICD-10-CM | POA: Diagnosis not present

## 2023-07-10 DIAGNOSIS — E1040 Type 1 diabetes mellitus with diabetic neuropathy, unspecified: Secondary | ICD-10-CM | POA: Diagnosis not present

## 2023-07-18 DIAGNOSIS — Z961 Presence of intraocular lens: Secondary | ICD-10-CM | POA: Diagnosis not present

## 2023-07-18 DIAGNOSIS — H35033 Hypertensive retinopathy, bilateral: Secondary | ICD-10-CM | POA: Diagnosis not present

## 2023-07-18 DIAGNOSIS — H2512 Age-related nuclear cataract, left eye: Secondary | ICD-10-CM | POA: Diagnosis not present

## 2023-07-18 DIAGNOSIS — E103593 Type 1 diabetes mellitus with proliferative diabetic retinopathy without macular edema, bilateral: Secondary | ICD-10-CM | POA: Diagnosis not present

## 2023-08-04 DIAGNOSIS — H2512 Age-related nuclear cataract, left eye: Secondary | ICD-10-CM | POA: Diagnosis not present

## 2023-08-06 DIAGNOSIS — E1040 Type 1 diabetes mellitus with diabetic neuropathy, unspecified: Secondary | ICD-10-CM | POA: Diagnosis not present

## 2023-08-08 ENCOUNTER — Ambulatory Visit: Admitting: Family Medicine

## 2023-08-08 VITALS — BP 126/78 | HR 78 | Temp 98.5°F | Ht 67.0 in | Wt 191.8 lb

## 2023-08-08 DIAGNOSIS — E782 Mixed hyperlipidemia: Secondary | ICD-10-CM | POA: Diagnosis not present

## 2023-08-08 DIAGNOSIS — M545 Low back pain, unspecified: Secondary | ICD-10-CM | POA: Diagnosis not present

## 2023-08-08 DIAGNOSIS — R152 Fecal urgency: Secondary | ICD-10-CM

## 2023-08-08 DIAGNOSIS — I10 Essential (primary) hypertension: Secondary | ICD-10-CM | POA: Diagnosis not present

## 2023-08-08 DIAGNOSIS — E1040 Type 1 diabetes mellitus with diabetic neuropathy, unspecified: Secondary | ICD-10-CM

## 2023-08-08 DIAGNOSIS — Z125 Encounter for screening for malignant neoplasm of prostate: Secondary | ICD-10-CM | POA: Diagnosis not present

## 2023-08-08 DIAGNOSIS — B351 Tinea unguium: Secondary | ICD-10-CM | POA: Diagnosis not present

## 2023-08-08 DIAGNOSIS — R159 Full incontinence of feces: Secondary | ICD-10-CM

## 2023-08-08 DIAGNOSIS — Z23 Encounter for immunization: Secondary | ICD-10-CM

## 2023-08-08 LAB — COMPREHENSIVE METABOLIC PANEL WITH GFR
ALT: 20 U/L (ref 0–53)
AST: 16 U/L (ref 0–37)
Albumin: 4.4 g/dL (ref 3.5–5.2)
Alkaline Phosphatase: 95 U/L (ref 39–117)
BUN: 56 mg/dL — ABNORMAL HIGH (ref 6–23)
CO2: 25 meq/L (ref 19–32)
Calcium: 9.1 mg/dL (ref 8.4–10.5)
Chloride: 105 meq/L (ref 96–112)
Creatinine, Ser: 2.31 mg/dL — ABNORMAL HIGH (ref 0.40–1.50)
GFR: 30.55 mL/min — ABNORMAL LOW (ref >60.00)
Glucose, Bld: 121 mg/dL — ABNORMAL HIGH (ref 70–99)
Potassium: 4.5 meq/L (ref 3.5–5.1)
Sodium: 139 meq/L (ref 135–145)
Total Bilirubin: 0.5 mg/dL (ref 0.2–1.2)
Total Protein: 6.7 g/dL (ref 6.0–8.3)

## 2023-08-08 LAB — LDL CHOLESTEROL, DIRECT: Direct LDL: 82 mg/dL

## 2023-08-08 LAB — MICROALBUMIN / CREATININE URINE RATIO
Creatinine,U: 55.3 mg/dL
Microalb Creat Ratio: 37 mg/g — ABNORMAL HIGH (ref 0.0–30.0)
Microalb, Ur: 2 mg/dL — ABNORMAL HIGH (ref 0.0–1.9)

## 2023-08-08 LAB — LIPID PANEL
Cholesterol: 135 mg/dL (ref 0–200)
HDL: 36.9 mg/dL — ABNORMAL LOW (ref >39.00)
LDL Cholesterol: 78 mg/dL (ref 0–99)
NonHDL: 98.09
Total CHOL/HDL Ratio: 4
Triglycerides: 101 mg/dL (ref 0.0–149.0)
VLDL: 20.2 mg/dL (ref 0.0–40.0)

## 2023-08-08 LAB — CBC
HCT: 37.3 % — ABNORMAL LOW (ref 39.0–52.0)
Hemoglobin: 12.5 g/dL — ABNORMAL LOW (ref 13.0–17.0)
MCHC: 33.4 g/dL (ref 30.0–36.0)
MCV: 89.1 fl (ref 78.0–100.0)
Platelets: 102 10*3/uL — ABNORMAL LOW (ref 150.0–400.0)
RBC: 4.19 Mil/uL — ABNORMAL LOW (ref 4.22–5.81)
RDW: 12.9 % (ref 11.5–15.5)
WBC: 5 10*3/uL (ref 4.0–10.5)

## 2023-08-08 LAB — URINALYSIS, ROUTINE W REFLEX MICROSCOPIC
Bilirubin Urine: NEGATIVE
Hgb urine dipstick: NEGATIVE
Ketones, ur: NEGATIVE
Leukocytes,Ua: NEGATIVE
Nitrite: NEGATIVE
RBC / HPF: NONE SEEN (ref 0–?)
Specific Gravity, Urine: 1.01 (ref 1.000–1.030)
Total Protein, Urine: NEGATIVE
Urine Glucose: NEGATIVE
Urobilinogen, UA: 0.2 (ref 0.0–1.0)
pH: 6 (ref 5.0–8.0)

## 2023-08-08 LAB — PSA: PSA: 0.96 ng/mL (ref 0.10–4.00)

## 2023-08-08 NOTE — Progress Notes (Addendum)
 Established Patient Office Visit   Subjective:  Patient ID: Adrian Neal, male    DOB: 06-25-1965  Age: 58 y.o. MRN: [id number]  Chief Complaint  Patient presents with   Medical Management of Chronic Issues    Follow up. Pt is not fasting.     HPI Encounter Diagnoses  Name Primary?   Midline low back pain without sciatica, unspecified chronicity Yes   Incontinence of feces with fecal urgency    Type 1 diabetes mellitus with diabetic neuropathy, unspecified (HCC)    Mixed hyperlipidemia    Essential hypertension    Screening for prostate cancer    Onychomycosis    Immunization due    For follow-up of above.  He is concerned about dense paresthesias in in the sole of his right foot after standing for a period of time.  Ongoing intermittent lower back pain.  Lumbar spine films revealed mild arthritis in the facet joints L4-L5 and L5-S1.  Disc spaces are preserved.  There is no radiation of pain from the back into his legs.  No saddle paresthesias.  However, he has experienced some fecal incontinence over the last month or so.  He has ongoing pain in the toes of both feet with a history of diabetic neuropathy.  ABI Doppler studies were normal in 2023.  Continues follow-up with endocrinology for type 1 diabetes.  Nonfasting today.  It is difficult for him to fast.  He did have 2 sausage biscuits this morning.  Continues follow-up with [facility].   Review of Systems  Constitutional: Negative.   HENT: Negative.    Eyes:  Negative for blurred vision, discharge and redness.  Respiratory: Negative.    Cardiovascular: Negative.   Gastrointestinal:  Negative for abdominal pain.  Genitourinary: Negative.   Musculoskeletal: Negative.  Negative for myalgias.  Skin:  Negative for rash.  Neurological:  Negative for tingling, loss of consciousness and weakness.  Endo/Heme/Allergies:  Negative for polydipsia.     Current Outpatient Medications:    atorvastatin (LIPITOR) 40 MG  tablet, TAKE 1 TABLET BY MOUTH ONCE DAILY AT  6  PM, Disp: 90 tablet, Rfl: 3   Blood Glucose Monitoring Suppl (ONETOUCH VERIO FLEX SYSTEM) w/Device KIT, 1 Device by Does not apply route in the morning, at noon, in the evening, and at bedtime., Disp: 1 kit, Rfl: 0   carvedilol (COREG) 25 MG tablet, Take 1 tablet by mouth twice daily, Disp: 180 tablet, Rfl: 0   chlorthalidone (HYGROTON) 25 MG tablet, Take 25 mg by mouth daily., Disp: , Rfl:    clopidogrel (PLAVIX) 75 MG tablet, TAKE 1 TABLET BY MOUTH ONCE DAILY TAKE  WITH  ASPIRIN  FOR  THREE  MONTHS  THEN  STOP  ASPIRIN, Disp: 90 tablet, Rfl: 2   Continuous Glucose Sensor (FREESTYLE LIBRE 2 PLUS SENSOR) MISC, 1 Device by Does not apply route every 14 (fourteen) days., Disp: 6 each, Rfl: 3   glucose blood (ONETOUCH VERIO) test strip, 1 each by Other route 3 (three) times daily. Use as instructed, Disp: 300 each, Rfl: 3   hydrALAZINE (APRESOLINE) 10 MG tablet, Take 1 tablet twice a day by oral route., Disp: , Rfl:    Insulin Infusion Pump (T:SLIM INSULIN PUMP) DEVI, by Does not apply route., Disp: , Rfl:    insulin lispro (HUMALOG) 100 UNIT/ML injection, Max daily dose of 100 units via pump DX E10.59 (vials), Disp: 90 mL, Rfl: 4   insulin lispro (HUMALOG) 100 UNIT/ML injection, INJECT UNDER THE  SKIN DAILY VIA PUMP. MAX DOSE PER DAY: 100 UNITS, Disp: 120 mL, Rfl: 3   lisinopril (ZESTRIL) 40 MG tablet, Take 40 mg by mouth daily., Disp: , Rfl:    methocarbamol (ROBAXIN) 500 MG tablet, Take 1 tablet (500 mg total) by mouth every 8 (eight) hours as needed for muscle spasms., Disp: 40 tablet, Rfl: 0   pantoprazole (PROTONIX) 40 MG tablet, Take 1 tablet by mouth once daily, Disp: 90 tablet, Rfl: 2   sodium bicarbonate 650 MG tablet, Take 650 mg by mouth 2 (two) times daily., Disp: , Rfl:    spironolactone (ALDACTONE) 25 MG tablet, Take 0.5 tablets (12.5 mg total) by mouth daily., Disp: 15 tablet, Rfl: 11   Objective:     BP 126/78 (BP Location: Right Arm,  Patient Position: Sitting, Cuff Size: Normal)   Pulse 78   Temp 98.5 F (36.9 C) (Temporal)   Ht 5\' 7"  (1.702 m)   Wt 191 lb 12.8 oz (87 kg)   SpO2 96%   BMI 30.04 kg/m    Physical Exam Constitutional:      General: He is not in acute distress.    Appearance: Normal appearance. He is not ill-appearing, toxic-appearing or diaphoretic.  HENT:     Head: Normocephalic and atraumatic.     Right Ear: External ear normal.     Left Ear: External ear normal.  Eyes:     General: No scleral icterus.       Right eye: No discharge.        Left eye: No discharge.     Extraocular Movements: Extraocular movements intact.     Conjunctiva/sclera: Conjunctivae normal.  Cardiovascular:     Pulses:          Dorsalis pedis pulses are 1+ on the right side and 1+ on the left side.       Posterior tibial pulses are 1+ on the right side and 1+ on the left side.     Comments: Capillary refill brisk in both great toes. Pulmonary:     Effort: Pulmonary effort is normal. No respiratory distress.  Musculoskeletal:     Lumbar back: No bony tenderness. Normal range of motion. Negative right straight leg raise test and negative left straight leg raise test.  Skin:    General: Skin is warm and dry.       Neurological:     Mental Status: He is alert and oriented to person, place, and time.     Motor: No weakness.  Psychiatric:        Mood and Affect: Mood normal.        Behavior: Behavior normal.      No results found for any visits on 08/08/23.    The ASCVD Risk score (Arnett DK, et al., 2019) failed to calculate for the following reasons:   Risk score cannot be calculated because patient has a medical history suggesting prior/existing ASCVD    Assessment & Plan:   Midline low back pain without sciatica, unspecified chronicity -     Ambulatory referral to Sports Medicine  Incontinence of feces with fecal urgency  Type 1 diabetes mellitus with diabetic neuropathy, unspecified (HCC) -      CBC -     Urinalysis, Routine w reflex microscopic -     Microalbumin / creatinine urine ratio -     Comprehensive metabolic panel with GFR  Mixed hyperlipidemia -     Lipid panel -     LDL cholesterol, direct -  Comprehensive metabolic panel with GFR  Essential hypertension -     Urinalysis, Routine w reflex microscopic -     Microalbumin / creatinine urine ratio -     Comprehensive metabolic panel with GFR  Screening for prostate cancer -     PSA  Onychomycosis -     Ambulatory referral to Podiatry  Immunization due -     Pneumococcal conjugate vaccine 20-valent    Return in about 3 months (around 11/07/2023).  Thinks he may be due for his next colonoscopy.  Will wait to hear from [facility].  Let me know.  Podiatry referral for extensive onychomycosis in his feet.  Sports medicine referral for consultation for intermittent low back pain with fecal incontinence.  Prevnar 20 today.  Will need to be seen at pharmacy for second Shingrix vaccine.  Tonna Frederic, MD

## 2023-08-10 DIAGNOSIS — E1040 Type 1 diabetes mellitus with diabetic neuropathy, unspecified: Secondary | ICD-10-CM | POA: Diagnosis not present

## 2023-08-10 DIAGNOSIS — E1059 Type 1 diabetes mellitus with other circulatory complications: Secondary | ICD-10-CM | POA: Diagnosis not present

## 2023-08-18 ENCOUNTER — Encounter: Payer: Self-pay | Admitting: Podiatry

## 2023-08-18 ENCOUNTER — Ambulatory Visit (INDEPENDENT_AMBULATORY_CARE_PROVIDER_SITE_OTHER): Admitting: Podiatry

## 2023-08-18 DIAGNOSIS — N1832 Chronic kidney disease, stage 3b: Secondary | ICD-10-CM | POA: Diagnosis not present

## 2023-08-18 DIAGNOSIS — E1022 Type 1 diabetes mellitus with diabetic chronic kidney disease: Secondary | ICD-10-CM

## 2023-08-18 DIAGNOSIS — B351 Tinea unguium: Secondary | ICD-10-CM | POA: Diagnosis not present

## 2023-08-18 MED ORDER — TERBINAFINE HCL 250 MG PO TABS: 250.0000 mg | ORAL_TABLET | Freq: Every day | ORAL | 0 refills | Status: AC

## 2023-08-18 NOTE — Patient Instructions (Signed)
 VISIT SUMMARY:  Today, we discussed your ongoing toenail issues, likely due to nail fungus, as well as your diabetic neuropathy, diabetes management, and chronic kidney disease. We reviewed your current medications and considered the best treatment options for your toenail condition.  YOUR PLAN:  -ONYCHOMYCOSIS: Onychomycosis is a fungal infection of the toenails that causes them to become thick, elongated, and crumbly with a yellowish texture. We will start you on oral terbinafine  (Lamisil ) to treat the infection. Please monitor for side effects such as muscle aches, nausea, stomach cramps, and diarrhea. We will recheck your lab work in one month to monitor your kidney and liver function. If the medication is not effective, we may consider laser treatment or routine diabetic foot care.  -DIABETIC NEUROPATHY: Diabetic neuropathy is nerve damage caused by diabetes, leading to altered sensation and swelling in your feet. Continue with your routine diabetic foot care and monitoring to manage this condition.  -DIABETES MELLITUS: Diabetes mellitus is a condition where your blood sugar levels are too high. Your A1c levels are well-controlled, so continue with your current diabetes management plan.  -CHRONIC KIDNEY DISEASE STAGE 3: Chronic kidney disease stage 3 means your kidneys are moderately damaged and not working as well as they should. We will monitor your kidney function with lab work in one month, especially with the introduction of the new medication.  INSTRUCTIONS:  Please have your lab work rechecked in one month at [facility] to monitor your kidney and liver function. Continue monitoring for any side effects from the new medication and follow up with routine diabetic foot care. If you have any concerns or experience any side effects, please contact our office.

## 2023-08-18 NOTE — Progress Notes (Signed)
 Subjective:  Patient ID: Adrian Neal, male    DOB: 05/21/1965,  MRN: [id number]  Chief Complaint  Patient presents with   Nail Problem    Toenails bilateral - thick, discolored x 20 years, did damage the hallux right nail by dropping a box on it while working in [employer], no treatment    New Patient (Initial Visit)    Diabetic - type 1 - last A1c was 7.1    Discussed the use of AI scribe software for clinical note transcription with the patient, who gave verbal consent to proceed.  History of Present Illness Adrian Neal is a 58 year old male with diabetes and neuropathy who presents with toenail issues, likely due to nail fungus.  He has experienced toenail issues for as long as he can remember, suspecting it to be nail fungus. The nails are thick, elongated, and have a crumbly yellowish texture. This condition has been persistent and has physically damaged the nails over time.  He has diabetes and experiences neuropathy, which causes swelling in his feet. He mentions having sensitive feet and is able to feel if his feet are functioning properly. He has difficulty bending down to reach his left foot due to a past stroke and hip fracture, which complicates nail care.  He is currently on several medications including lisinopril , atorvastatin , chlorthalidone, clopidogrel , pantoprazole , and spironolactone . He has stage three chronic kidney disease but no known liver issues. No recollection of muscle aches when starting atorvastatin .  He wants to treat the toenail condition to avoid wearing socks all the time and to be able to wear sandals occasionally, especially in the summer. He uses nail nippers at home due to the thickness of his toenails.      Objective:    Physical Exam VASCULAR: DP and PT pulse palpable bilaterally. Foot is warm and well-perfused. Capillary fill time is brisk. DERMATOLOGIC: Thick, elongated mycotic dystrophic nails with yellowish crumbly texture and  cervical debris. Normal skin turgor, texture, and temperature. No open lesions, rashes, or ulcerations. NEUROLOGIC: Normal sensation to light touch and pressure. No paresthesias on examination. ORTHOPEDIC: Smooth, pain-free range of motion of all examined joints. No ecchymosis or bruising. No gross deformity. No pain to palpation.   No images are attached to the encounter.    Results Procedure: Toenail debridement Description: Thick and elongated mycotic dystrophic nails with a crumbly yellowish texture and subungual debris on all ten toes were debrided in length and thickness using nail nippers.   LABS GFR: 30 (08/08/2023) Creatinine: 2.31 (08/08/2023) LFT: normal (08/08/2023)   Assessment:   1. Onychomycosis   2. 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  Assessment and Plan Assessment & Plan Onychomycosis Chronic onychomycosis with thick, elongated, mycotic dystrophic nails and crumbly yellowish texture. The condition has led to physical damage to the nails. Oral antifungal medications like terbinafine  (Lamisil ) have an 80-85% success rate but require monitoring due to potential side effects such as muscle aches, nausea, stomach cramps, and diarrhea, and interactions with current medications like atorvastatin . Laser treatment is equally effective but not covered by insurance, making it a secondary option. The condition is not a major health concern but can lead to tinea pedis. - Initiate oral terbinafine  (Lamisil ) treatment. - Recheck lab work in one month at [facility] to monitor renal and hepatic function. - Monitor for side effects such as myalgia, nausea, abdominal cramps,  and diarrhea. - Consider routine diabetic foot care if medication is ineffective. -Nails debrided in length and thickness today to a tolerable level - Discuss potential for laser treatment if oral medication  is not effective.  Diabetic Neuropathy Diabetic neuropathy with altered sensation and swelling in the feet. The condition is related to diabetes, which has been managed for a long time. He has difficulty with self-care due to physical limitations from a previous stroke and hip fracture. - Continue routine diabetic foot care and monitoring.  Diabetes Mellitus Diabetes mellitus with well-controlled A1c levels. The condition is long-standing and managed effectively. - Continue current diabetes management plan.  Chronic Kidney Disease Stage 3 Chronic kidney disease stage 3 with a GFR of 30 and creatinine level of 2.31. The condition requires monitoring, especially with the introduction of new medications. - Monitor renal function with lab work in one month.      No follow-ups on file.

## 2023-08-19 NOTE — Progress Notes (Unsigned)
    Ben Jackson D.[facility] [address] [place] [address] Phone: [phone]   Assessment and Plan:     There are no diagnoses linked to this encounter.  ***   Pertinent previous records reviewed include ***    Follow Up: ***     Subjective:   I, Anderson Middlebrooks, am serving as a Neurosurgeon for Doctor Ulysees Gander  Chief Complaint: thoracic back pain with right foot  numbness  HPI:   08/20/2023 Patient is a 58 year old male with thoracic back pain with right foot numbness. Patient states   Relevant Historical Information: ***  Additional pertinent review of systems negative.   Current Outpatient Medications:    atorvastatin  (LIPITOR) 40 MG tablet, TAKE 1 TABLET BY MOUTH ONCE DAILY AT  6  PM, Disp: 90 tablet, Rfl: 3   Blood Glucose Monitoring Suppl (ONETOUCH VERIO FLEX SYSTEM) w/Device KIT, 1 Device by Does not apply route in the morning, at noon, in the evening, and at bedtime., Disp: 1 kit, Rfl: 0   carvedilol  (COREG ) 25 MG tablet, Take 1 tablet by mouth twice daily, Disp: 180 tablet, Rfl: 0   chlorthalidone (HYGROTON) 25 MG tablet, Take 25 mg by mouth daily., Disp: , Rfl:    clopidogrel  (PLAVIX ) 75 MG tablet, TAKE 1 TABLET BY MOUTH ONCE DAILY TAKE  WITH  ASPIRIN   FOR  THREE  MONTHS  THEN  STOP  ASPIRIN , Disp: 90 tablet, Rfl: 2   Continuous Glucose Sensor (FREESTYLE LIBRE 2 PLUS SENSOR) MISC, 1 Device by Does not apply route every 14 (fourteen) days., Disp: 6 each, Rfl: 3   glucose blood (ONETOUCH VERIO) test strip, 1 each by Other route 3 (three) times daily. Use as instructed, Disp: 300 each, Rfl: 3   hydrALAZINE  (APRESOLINE ) 10 MG tablet, Take 1 tablet twice a day by oral route., Disp: , Rfl:    Insulin  Infusion Pump (T:SLIM INSULIN  PUMP) DEVI, by Does not apply route., Disp: , Rfl:    insulin  lispro (HUMALOG ) 100 UNIT/ML injection, Max daily dose of 100 units via pump DX E10.59 (vials), Disp: 90 mL, Rfl: 4   insulin  lispro  (HUMALOG ) 100 UNIT/ML injection, INJECT UNDER THE SKIN DAILY VIA PUMP. MAX DOSE PER DAY: 100 UNITS, Disp: 120 mL, Rfl: 3   lisinopril  (ZESTRIL ) 40 MG tablet, Take 40 mg by mouth daily., Disp: , Rfl:    methocarbamol  (ROBAXIN ) 500 MG tablet, Take 1 tablet (500 mg total) by mouth every 8 (eight) hours as needed for muscle spasms., Disp: 40 tablet, Rfl: 0   pantoprazole  (PROTONIX ) 40 MG tablet, Take 1 tablet by mouth once daily, Disp: 90 tablet, Rfl: 2   sodium bicarbonate 650 MG tablet, Take 650 mg by mouth 2 (two) times daily., Disp: , Rfl:    spironolactone  (ALDACTONE ) 25 MG tablet, Take 0.5 tablets (12.5 mg total) by mouth daily., Disp: 15 tablet, Rfl: 11   terbinafine  (LAMISIL ) 250 MG tablet, Take 1 tablet (250 mg total) by mouth daily., Disp: 90 tablet, Rfl: 0   Objective:     There were no vitals filed for this visit.    There is no height or weight on file to calculate BMI.    Physical Exam:    ***   Electronically signed by:  Marshall Skeeter D.[facility] 7:47 AM 08/19/23

## 2023-08-20 ENCOUNTER — Ambulatory Visit: Admitting: Sports Medicine

## 2023-08-20 VITALS — BP 128/78 | HR 78 | Ht 67.0 in | Wt 192.0 lb

## 2023-08-20 DIAGNOSIS — G8929 Other chronic pain: Secondary | ICD-10-CM | POA: Diagnosis not present

## 2023-08-20 DIAGNOSIS — R159 Full incontinence of feces: Secondary | ICD-10-CM

## 2023-08-20 DIAGNOSIS — R200 Anesthesia of skin: Secondary | ICD-10-CM

## 2023-08-20 DIAGNOSIS — M5441 Lumbago with sciatica, right side: Secondary | ICD-10-CM

## 2023-08-20 DIAGNOSIS — R152 Fecal urgency: Secondary | ICD-10-CM | POA: Diagnosis not present

## 2023-08-20 NOTE — Patient Instructions (Signed)
 MRI low back  Follow up 2 days after MRI to discuss results

## 2023-08-24 ENCOUNTER — Ambulatory Visit

## 2023-08-24 DIAGNOSIS — M545 Low back pain, unspecified: Secondary | ICD-10-CM

## 2023-08-24 DIAGNOSIS — M25561 Pain in right knee: Secondary | ICD-10-CM | POA: Diagnosis not present

## 2023-08-24 DIAGNOSIS — M79671 Pain in right foot: Secondary | ICD-10-CM | POA: Diagnosis not present

## 2023-08-24 DIAGNOSIS — M5136 Other intervertebral disc degeneration, lumbar region with discogenic back pain only: Secondary | ICD-10-CM | POA: Diagnosis not present

## 2023-08-24 DIAGNOSIS — M47816 Spondylosis without myelopathy or radiculopathy, lumbar region: Secondary | ICD-10-CM | POA: Diagnosis not present

## 2023-08-24 DIAGNOSIS — M5137 Other intervertebral disc degeneration, lumbosacral region with discogenic back pain only: Secondary | ICD-10-CM | POA: Diagnosis not present

## 2023-08-24 DIAGNOSIS — R200 Anesthesia of skin: Secondary | ICD-10-CM | POA: Diagnosis not present

## 2023-08-24 DIAGNOSIS — G8929 Other chronic pain: Secondary | ICD-10-CM

## 2023-08-24 DIAGNOSIS — M4807 Spinal stenosis, lumbosacral region: Secondary | ICD-10-CM | POA: Diagnosis not present

## 2023-08-24 DIAGNOSIS — R152 Fecal urgency: Secondary | ICD-10-CM

## 2023-08-25 ENCOUNTER — Ambulatory Visit (INDEPENDENT_AMBULATORY_CARE_PROVIDER_SITE_OTHER): Admitting: Sports Medicine

## 2023-08-25 VITALS — BP 128/60 | HR 71 | Ht 67.0 in | Wt 196.0 lb

## 2023-08-25 DIAGNOSIS — R159 Full incontinence of feces: Secondary | ICD-10-CM | POA: Diagnosis not present

## 2023-08-25 DIAGNOSIS — M51362 Other intervertebral disc degeneration, lumbar region with discogenic back pain and lower extremity pain: Secondary | ICD-10-CM

## 2023-08-25 DIAGNOSIS — M5441 Lumbago with sciatica, right side: Secondary | ICD-10-CM

## 2023-08-25 DIAGNOSIS — G8929 Other chronic pain: Secondary | ICD-10-CM | POA: Diagnosis not present

## 2023-08-25 DIAGNOSIS — R200 Anesthesia of skin: Secondary | ICD-10-CM

## 2023-08-25 DIAGNOSIS — R152 Fecal urgency: Secondary | ICD-10-CM

## 2023-08-25 NOTE — Progress Notes (Signed)
 Adrian Neal D.[facility] [address] [place] [address] Phone: [phone]   Assessment and Plan:     1. Chronic bilateral low back pain with right-sided sciatica 2. Degeneration of intervertebral disc of lumbar region with discogenic back pain and lower extremity pain 3. Numbness of right foot -Chronic with exacerbation, subsequent visit - Continued low back pain and right leg/foot numbness.  No definitive explanation based on MRI from 08/24/2023 which showed mild foraminal narrowing bilaterally at L5-S1 without clear neurologic impingement, and multilevel spondylosis with possible left L3 nerve root encroachment - We discussed that his right-sided symptoms could potentially be from mild degenerative changes seen on MRI, so patient elected to proceed with epidural CSI to right sided L5-S1 - Do not recommend NSAIDs or prednisone due to past medical history of DM type I and CKD - Not consistent with diabetic neuropathy based on specific areas of unilateral numbness and tingling rather than broad bilateral symptoms.  Not consistent with vascular pathology based on worsening with rest, and alleviated when mobile.   4. Incontinence of feces with fecal urgency -Acute, resolved, subsequent visit - Patient states that his incontinence episodes lasted only for a week and a half and have resolved.  No further workup at this time, however if the urgency for additional incontinence episodes arise, recommend evaluation with PCP and GI   Pertinent previous records reviewed include lumbar MRI 08/24/2023  Follow Up: 2 weeks after epidural CSI to review benefit   Subjective:   I, Adrian Neal am a scribe for Dr. Cleora Daft.   Chief Complaint: right foot pain  HPI:   08/20/2023 Patient is a 58 year old male with thoracic back pain with right foot numbness. Patient states foot numbness.  hx of diabetes . Will be standing and then he will have pain. Middle  two toes go numb sometimes. . Antalgic gait. This has been going on for a few months. No falls, just shuffle steps.   08/25/23 Patient states that no back pain today. Right foot is about the same as it has been.   Relevant Historical Information: DM type I, CKD, history of CVA, OSA  Additional pertinent review of systems negative.   Current Outpatient Medications:    atorvastatin  (LIPITOR) 40 MG tablet, TAKE 1 TABLET BY MOUTH ONCE DAILY AT  6  PM, Disp: 90 tablet, Rfl: 3   Blood Glucose Monitoring Suppl (ONETOUCH VERIO FLEX SYSTEM) w/Device KIT, 1 Device by Does not apply route in the morning, at noon, in the evening, and at bedtime., Disp: 1 kit, Rfl: 0   carvedilol  (COREG ) 25 MG tablet, Take 1 tablet by mouth twice daily, Disp: 180 tablet, Rfl: 0   chlorthalidone (HYGROTON) 25 MG tablet, Take 25 mg by mouth daily., Disp: , Rfl:    clopidogrel  (PLAVIX ) 75 MG tablet, TAKE 1 TABLET BY MOUTH ONCE DAILY TAKE  WITH  ASPIRIN   FOR  THREE  MONTHS  THEN  STOP  ASPIRIN , Disp: 90 tablet, Rfl: 2   Continuous Glucose Sensor (FREESTYLE LIBRE 2 PLUS SENSOR) MISC, 1 Device by Does not apply route every 14 (fourteen) days., Disp: 6 each, Rfl: 3   glucose blood (ONETOUCH VERIO) test strip, 1 each by Other route 3 (three) times daily. Use as instructed, Disp: 300 each, Rfl: 3   hydrALAZINE  (APRESOLINE ) 10 MG tablet, Take 1 tablet twice a day by oral route., Disp: , Rfl:    Insulin  Infusion Pump (T:SLIM INSULIN  PUMP) DEVI, by  Does not apply route., Disp: , Rfl:    insulin  lispro (HUMALOG ) 100 UNIT/ML injection, Max daily dose of 100 units via pump DX E10.59 (vials), Disp: 90 mL, Rfl: 4   insulin  lispro (HUMALOG ) 100 UNIT/ML injection, INJECT UNDER THE SKIN DAILY VIA PUMP. MAX DOSE PER DAY: 100 UNITS, Disp: 120 mL, Rfl: 3   lisinopril  (ZESTRIL ) 40 MG tablet, Take 40 mg by mouth daily., Disp: , Rfl:    methocarbamol  (ROBAXIN ) 500 MG tablet, Take 1 tablet (500 mg total) by mouth every 8 (eight) hours as needed for  muscle spasms., Disp: 40 tablet, Rfl: 0   pantoprazole  (PROTONIX ) 40 MG tablet, Take 1 tablet by mouth once daily, Disp: 90 tablet, Rfl: 2   sodium bicarbonate 650 MG tablet, Take 650 mg by mouth 2 (two) times daily., Disp: , Rfl:    terbinafine  (LAMISIL ) 250 MG tablet, Take 1 tablet (250 mg total) by mouth daily., Disp: 90 tablet, Rfl: 0   spironolactone  (ALDACTONE ) 25 MG tablet, Take 0.5 tablets (12.5 mg total) by mouth daily., Disp: 15 tablet, Rfl: 11   Objective:     Vitals:   08/25/23 1013  BP: 128/60  Pulse: 71  SpO2: 90%  Weight: 196 lb (88.9 kg)  Height: 5\' 7"  (1.702 m)      Body mass index is 30.7 kg/m.    Physical Exam:    Gen: Appears well, nad, nontoxic and pleasant Psych: Alert and oriented, appropriate mood and affect Neuro: sensation intact at today's visit, strength is 5/5 in upper and lower extremities, muscle tone wnl Skin: no susupicious lesions or rashes   Back - Normal skin, Spine with normal alignment and no deformity.   No tenderness to vertebral process palpation.   Paraspinous muscles are not tender and without spasm NTTP gluteal musculature Straight leg raise negative Trendelenberg negative Piriformis Test negative Gait normal       Electronically signed by:  Marshall Skeeter D.[facility] 12:22 PM 08/25/23

## 2023-08-25 NOTE — Patient Instructions (Signed)
 Epidural right L5-S1 Tylenol  for day to day pain relief  Follow up 2 weeks after to discuss results

## 2023-09-09 ENCOUNTER — Other Ambulatory Visit

## 2023-09-09 DIAGNOSIS — E1040 Type 1 diabetes mellitus with diabetic neuropathy, unspecified: Secondary | ICD-10-CM | POA: Diagnosis not present

## 2023-09-09 DIAGNOSIS — E1059 Type 1 diabetes mellitus with other circulatory complications: Secondary | ICD-10-CM | POA: Diagnosis not present

## 2023-09-09 NOTE — Discharge Instructions (Signed)
 Post Procedure Spinal Discharge Instruction Sheet  You may resume a regular diet and any medications that you routinely take (including pain medications) unless otherwise noted by MD.  No driving day of procedure.  Light activity throughout the rest of the day.  Do not do any strenuous work, exercise, bending or lifting.  The day following the procedure, you can resume normal physical activity but you should refrain from exercising or physical therapy for at least three days thereafter.  You may apply ice to the injection site, 20 minutes on, 20 minutes off, as needed. Do not apply ice directly to skin.    Common Side Effects:  Headaches- take your usual medications as directed by your physician.  Increase your fluid intake.  Caffeinated beverages may be helpful.  Lie flat in bed until your headache resolves.  Restlessness or inability to sleep- you may have trouble sleeping for the next few days.  Ask your referring physician if you need any medication for sleep.  Facial flushing or redness- should subside within a few days.  Increased pain- a temporary increase in pain a day or two following your procedure is not unusual.  Take your pain medication as prescribed by your referring physician.  Leg cramps  Please contact our office at [phone] for the following symptoms: Fever greater than 100 degrees. Headaches unresolved with medication after 2-3 days. Increased swelling, pain, or redness at injection site.   MAY RESUME YOUR PLAVIX  TODAY.  Thank you for visiting [facility] today.

## 2023-09-10 ENCOUNTER — Ambulatory Visit
Admission: RE | Admit: 2023-09-10 | Discharge: 2023-09-10 | Disposition: A | Discharge status: Home / Self Care | Source: Ambulatory Visit | Attending: Sports Medicine

## 2023-09-10 ENCOUNTER — Other Ambulatory Visit: Payer: Self-pay | Admitting: Sports Medicine

## 2023-09-10 DIAGNOSIS — R200 Anesthesia of skin: Secondary | ICD-10-CM

## 2023-09-10 DIAGNOSIS — R152 Fecal urgency: Secondary | ICD-10-CM

## 2023-09-10 DIAGNOSIS — M51362 Other intervertebral disc degeneration, lumbar region with discogenic back pain and lower extremity pain: Secondary | ICD-10-CM

## 2023-09-10 DIAGNOSIS — G8929 Other chronic pain: Secondary | ICD-10-CM

## 2023-09-10 DIAGNOSIS — E109 Type 1 diabetes mellitus without complications: Secondary | ICD-10-CM | POA: Diagnosis not present

## 2023-09-15 DIAGNOSIS — N184 Chronic kidney disease, stage 4 (severe): Secondary | ICD-10-CM | POA: Diagnosis not present

## 2023-09-15 NOTE — Discharge Instructions (Signed)
 Post Procedure Spinal Discharge Instruction Sheet  You may resume a regular diet and any medications that you routinely take (including pain medications) unless otherwise noted by MD.  No driving day of procedure.  Light activity throughout the rest of the day.  Do not do any strenuous work, exercise, bending or lifting.  The day following the procedure, you can resume normal physical activity but you should refrain from exercising or physical therapy for at least three days thereafter.  You may apply ice to the injection site, 20 minutes on, 20 minutes off, as needed. Do not apply ice directly to skin.    Common Side Effects:  Headaches- take your usual medications as directed by your physician.  Increase your fluid intake.  Caffeinated beverages may be helpful.  Lie flat in bed until your headache resolves.  Restlessness or inability to sleep- you may have trouble sleeping for the next few days.  Ask your referring physician if you need any medication for sleep.  Facial flushing or redness- should subside within a few days.  Increased pain- a temporary increase in pain a day or two following your procedure is not unusual.  Take your pain medication as prescribed by your referring physician.  Leg cramps  Please contact our office at [phone] for the following symptoms: Fever greater than 100 degrees. Headaches unresolved with medication after 2-3 days. Increased swelling, pain, or redness at injection site.  **MAY RESUME TAKING YOUR PLAVIX  TODAY**  Thank you for visiting [facility] today.

## 2023-09-16 ENCOUNTER — Ambulatory Visit
Admission: RE | Admit: 2023-09-16 | Discharge: 2023-09-16 | Disposition: A | Discharge status: Home / Self Care | Source: Ambulatory Visit | Attending: Sports Medicine | Admitting: Sports Medicine

## 2023-09-16 DIAGNOSIS — M4727 Other spondylosis with radiculopathy, lumbosacral region: Secondary | ICD-10-CM | POA: Diagnosis not present

## 2023-09-16 MED ORDER — IOPAMIDOL (ISOVUE-M 200) INJECTION 41%
1.0000 mL | Freq: Once | INTRAMUSCULAR | Status: AC
  Administered 2023-09-16: 1 mL via EPIDURAL

## 2023-09-16 MED ORDER — METHYLPREDNISOLONE ACETATE 40 MG/ML INJ SUSP (RADIOLOG
80.0000 mg | Freq: Once | INTRAMUSCULAR | Status: AC
  Administered 2023-09-16: 80 mg via EPIDURAL

## 2023-09-20 ENCOUNTER — Other Ambulatory Visit: Payer: Self-pay | Admitting: Family Medicine

## 2023-09-20 DIAGNOSIS — I10 Essential (primary) hypertension: Secondary | ICD-10-CM

## 2023-10-13 ENCOUNTER — Encounter: Payer: Self-pay | Admitting: Internal Medicine

## 2023-10-15 DIAGNOSIS — Z961 Presence of intraocular lens: Secondary | ICD-10-CM | POA: Diagnosis not present

## 2023-10-15 DIAGNOSIS — E103593 Type 1 diabetes mellitus with proliferative diabetic retinopathy without macular edema, bilateral: Secondary | ICD-10-CM | POA: Diagnosis not present

## 2023-10-15 DIAGNOSIS — H25812 Combined forms of age-related cataract, left eye: Secondary | ICD-10-CM | POA: Diagnosis not present

## 2023-10-15 DIAGNOSIS — E109 Type 1 diabetes mellitus without complications: Secondary | ICD-10-CM | POA: Diagnosis not present

## 2023-10-15 DIAGNOSIS — H35033 Hypertensive retinopathy, bilateral: Secondary | ICD-10-CM | POA: Diagnosis not present

## 2023-10-17 ENCOUNTER — Other Ambulatory Visit: Payer: Self-pay | Admitting: Family Medicine

## 2023-10-17 DIAGNOSIS — K219 Gastro-esophageal reflux disease without esophagitis: Secondary | ICD-10-CM

## 2023-11-07 ENCOUNTER — Ambulatory Visit (INDEPENDENT_AMBULATORY_CARE_PROVIDER_SITE_OTHER): Admitting: Family Medicine

## 2023-11-07 ENCOUNTER — Other Ambulatory Visit: Payer: Self-pay | Admitting: Family Medicine

## 2023-11-07 ENCOUNTER — Encounter: Payer: Self-pay | Admitting: Family Medicine

## 2023-11-07 VITALS — BP 150/78 | HR 79 | Temp 98.6°F | Ht 67.0 in | Wt 196.2 lb

## 2023-11-07 DIAGNOSIS — E1040 Type 1 diabetes mellitus with diabetic neuropathy, unspecified: Secondary | ICD-10-CM | POA: Diagnosis not present

## 2023-11-07 DIAGNOSIS — I10 Essential (primary) hypertension: Secondary | ICD-10-CM | POA: Diagnosis not present

## 2023-11-07 DIAGNOSIS — R2681 Unsteadiness on feet: Secondary | ICD-10-CM

## 2023-11-07 DIAGNOSIS — Z8673 Personal history of transient ischemic attack (TIA), and cerebral infarction without residual deficits: Secondary | ICD-10-CM

## 2023-11-07 MED ORDER — GABAPENTIN 100 MG PO CAPS: 100.0000 mg | ORAL_CAPSULE | Freq: Every day | ORAL | 3 refills | Status: DC

## 2023-11-07 NOTE — Progress Notes (Signed)
 Established Patient Office Visit   Subjective:  Patient ID: Adrian Neal, male    DOB: 04-May-1965  Age: 58 y.o. MRN: [id number]  Chief Complaint  Patient presents with   Medical Management of Chronic Issues    Follow up 3 month. Pt is not fasting. Unsure of Hydralazine  Rx.     HPI Encounter Diagnoses  Name Primary?   Gait instability Yes   Essential hypertension    Type 1 diabetes mellitus with diabetic neuropathy, unspecified (HCC)    For follow-up of above.  Continues follow-up with sports medicine for back and left foot pain.  Status post epidural injection that did not seem to help much.  Continues with paresthesias in the right MTPs.  Left foot is not affected.  Fecal incontinence mostly resolved but continues with minor issue.  MRI was not diagnostic.  Agree with sports medicine that diabetic neuropathy seems unlikely.  Blood pressure at home runs in the 130s over 60s to 70s.  He did not take his medications today.  He continues with carvedilol  25 twice daily, chlorthalidone 25 mg daily Aldactone  25 mg daily and lisinopril  40 mg daily.  These are prescribed through his nephrologist.  He is no longer taking hydralazine .  Gait remains unstable since his stroke in 2018.  He has not had follow-up physical therapy for this since that time.   Review of Systems  Constitutional: Negative.   HENT: Negative.    Eyes:  Negative for blurred vision, discharge and redness.  Respiratory: Negative.    Cardiovascular: Negative.   Gastrointestinal:  Negative for abdominal pain.  Genitourinary: Negative.   Musculoskeletal:  Positive for back pain and joint pain. Negative for myalgias.  Skin:  Negative for rash.  Neurological:  Positive for tingling. Negative for loss of consciousness and weakness.  Endo/Heme/Allergies:  Negative for polydipsia.     Current Outpatient Medications:    atorvastatin  (LIPITOR) 40 MG tablet, TAKE 1 TABLET BY MOUTH ONCE DAILY AT  6  PM, Disp: 90 tablet, Rfl:  3   Blood Glucose Monitoring Suppl (ONETOUCH VERIO FLEX SYSTEM) w/Device KIT, 1 Device by Does not apply route in the morning, at noon, in the evening, and at bedtime., Disp: 1 kit, Rfl: 0   carvedilol  (COREG ) 25 MG tablet, Take 1 tablet by mouth twice daily, Disp: 180 tablet, Rfl: 0   chlorthalidone (HYGROTON) 25 MG tablet, Take 25 mg by mouth daily., Disp: , Rfl:    clopidogrel  (PLAVIX ) 75 MG tablet, TAKE 1 TABLET BY MOUTH ONCE DAILY TAKE  WITH  ASPIRIN   FOR  THREE  MONTHS  THEN  STOP  ASPIRIN , Disp: 90 tablet, Rfl: 2   Continuous Glucose Sensor (FREESTYLE LIBRE 2 PLUS SENSOR) MISC, 1 Device by Does not apply route every 14 (fourteen) days., Disp: 6 each, Rfl: 3   gabapentin  (NEURONTIN ) 100 MG capsule, Take 1 capsule (100 mg total) by mouth at bedtime., Disp: 30 capsule, Rfl: 3   glucose blood (ONETOUCH VERIO) test strip, 1 each by Other route 3 (three) times daily. Use as instructed, Disp: 300 each, Rfl: 3   Insulin  Infusion Pump (T:SLIM INSULIN  PUMP) DEVI, by Does not apply route., Disp: , Rfl:    insulin  lispro (HUMALOG ) 100 UNIT/ML injection, Max daily dose of 100 units via pump DX E10.59 (vials), Disp: 90 mL, Rfl: 4   insulin  lispro (HUMALOG ) 100 UNIT/ML injection, INJECT UNDER THE SKIN DAILY VIA PUMP. MAX DOSE PER DAY: 100 UNITS, Disp: 120 mL, Rfl: 3  lisinopril  (ZESTRIL ) 40 MG tablet, Take 40 mg by mouth daily., Disp: , Rfl:    pantoprazole  (PROTONIX ) 40 MG tablet, Take 1 tablet by mouth once daily, Disp: 90 tablet, Rfl: 0   sodium bicarbonate 650 MG tablet, Take 650 mg by mouth 2 (two) times daily., Disp: , Rfl:    spironolactone  (ALDACTONE ) 25 MG tablet, Take 0.5 tablets (12.5 mg total) by mouth daily., Disp: 15 tablet, Rfl: 11   terbinafine  (LAMISIL ) 250 MG tablet, Take 1 tablet (250 mg total) by mouth daily., Disp: 90 tablet, Rfl: 0   methocarbamol  (ROBAXIN ) 500 MG tablet, Take 1 tablet (500 mg total) by mouth every 8 (eight) hours as needed for muscle spasms. (Patient not taking:  Reported on 11/07/2023), Disp: 40 tablet, Rfl: 0   Objective:     BP (!) 150/78   Pulse 79   Temp 98.6 F (37 C) (Temporal)   Ht 5' 7 (1.702 m)   Wt 196 lb 3.2 oz (89 kg)   SpO2 99%   BMI 30.73 kg/m    Physical Exam Constitutional:      General: He is not in acute distress.    Appearance: Normal appearance. He is not ill-appearing, toxic-appearing or diaphoretic.  HENT:     Head: Normocephalic and atraumatic.     Right Ear: External ear normal.     Left Ear: External ear normal.  Eyes:     General: No scleral icterus.       Right eye: No discharge.        Left eye: No discharge.     Extraocular Movements: Extraocular movements intact.     Conjunctiva/sclera: Conjunctivae normal.  Cardiovascular:     Rate and Rhythm: Normal rate and regular rhythm.  Pulmonary:     Effort: Pulmonary effort is normal. No respiratory distress.     Breath sounds: No wheezing or rales.  Skin:    General: Skin is warm and dry.  Neurological:     Mental Status: He is alert and oriented to person, place, and time.  Psychiatric:        Mood and Affect: Mood normal.        Behavior: Behavior normal.      No results found for any visits on 11/07/23.    The ASCVD Risk score (Arnett DK, et al., 2019) failed to calculate for the following reasons:   Risk score cannot be calculated because patient has a medical history suggesting prior/existing ASCVD    Assessment & Plan:   Gait instability -     Ambulatory referral to Physical Therapy  Essential hypertension  Type 1 diabetes mellitus with diabetic neuropathy, unspecified (HCC) -     Gabapentin ; Take 1 capsule (100 mg total) by mouth at bedtime.  Dispense: 30 capsule; Refill: 3    Return in about 3 months (around 02/07/2024).  Trial of gabapentin  at at bedtime.  Dosage escalation if needed for sports medicine please.  Will send for physical therapy for gait instability.  Wonder if this could be contributing to his lower back  pain.  Adrian Sim Lent, MD

## 2023-11-14 DIAGNOSIS — E109 Type 1 diabetes mellitus without complications: Secondary | ICD-10-CM | POA: Diagnosis not present

## 2023-11-18 DIAGNOSIS — H2512 Age-related nuclear cataract, left eye: Secondary | ICD-10-CM | POA: Diagnosis not present

## 2023-11-18 DIAGNOSIS — H25812 Combined forms of age-related cataract, left eye: Secondary | ICD-10-CM | POA: Diagnosis not present

## 2023-11-18 DIAGNOSIS — H268 Other specified cataract: Secondary | ICD-10-CM | POA: Diagnosis not present

## 2023-11-18 HISTORY — PX: CATARACT EXTRACTION: SUR2

## 2023-11-20 ENCOUNTER — Encounter: Payer: Self-pay | Admitting: Podiatry

## 2023-11-20 ENCOUNTER — Ambulatory Visit: Admitting: Podiatry

## 2023-11-20 DIAGNOSIS — B351 Tinea unguium: Secondary | ICD-10-CM

## 2023-11-20 NOTE — Progress Notes (Signed)
  Subjective:  Patient ID: Adrian Neal, male    DOB: 1965-06-27,  MRN: [id number]  Chief Complaint  Patient presents with   Diabetes   Nail Problem    F/U fungus 3 month Lamisil  treatment. IDDM A1C 7.1.  Thick yellow discolored toenails bilateral.    Discussed the use of AI scribe software for clinical note transcription with the patient, who gave verbal consent to proceed.  History of Present Illness Adrian Neal returns for follow-up on his nail fungus, has not had any adverse effects taking the medication.      Objective:    Physical Exam VASCULAR: DP and PT pulse palpable bilaterally. Foot is warm and well-perfused. Capillary fill time is brisk. DERMATOLOGIC: Thick, elongated mycotic dystrophic nails with yellowish crumbly texture and cervical debris.  Mild proximal clearing and some toes NEUROLOGIC: Normal sensation to light touch and pressure. No paresthesias on examination. ORTHOPEDIC: Smooth, pain-free range of motion of all examined joints. No ecchymosis or bruising. No gross deformity. No pain to palpation.       Results    Assessment:   1. Onychomycosis      Plan:  Patient was evaluated and treated and all questions answered.  Assessment and Plan Assessment & Plan Onychomycosis Returns for follow-up today with some improvement.  Likely would benefit from another round of Lamisil .  He has an upcoming PCP appointment next month to review his lab work, as long as no change in LFTs or creatinine would be okay to proceed.  No adverse effects.  Will send refill following this and I will see him back in 4 months to review      Return in about 4 months (around 03/22/2024) for follow up after nail fungus treatment.

## 2023-11-21 ENCOUNTER — Ambulatory Visit: Payer: PPO

## 2023-11-21 DIAGNOSIS — Z0000 Encounter for general adult medical examination without abnormal findings: Secondary | ICD-10-CM

## 2023-11-21 NOTE — Progress Notes (Signed)
 Subjective:   Adrian Neal is a 58 y.o. who presents for a Medicare Wellness preventive visit.  As a reminder, Annual Wellness Visits don't include a physical exam, and some assessments may be limited, especially if this visit is performed virtually. We may recommend an in-person follow-up visit with your provider if needed.  Visit Complete: Virtual I connected with  Adrian Neal on 11/21/23 by a audio enabled telemedicine application and verified that I am speaking with the correct person using two identifiers.  Patient Location: Home  Provider Location: Office/Clinic  I discussed the limitations of evaluation and management by telemedicine. The patient expressed understanding and agreed to proceed.  Vital Signs: Because this visit was a virtual/telehealth visit, some criteria may be missing or patient reported. Any vitals not documented were not able to be obtained and vitals that have been documented are patient reported.  VideoError- Librarian, academic were attempted between this provider and patient, however failed, due to patient having technical difficulties OR patient did not have access to video capability.  We continued and completed visit with audio only.   Persons Participating in Visit: Patient.  AWV Questionnaire: Yes: Patient Medicare AWV questionnaire was completed by the patient on 11/19/2023; I have confirmed that all information answered by patient is correct and no changes since this date.  Cardiac Risk Factors include: advanced age (>93men, >15 women);diabetes mellitus;dyslipidemia;hypertension     Objective:    Today's Vitals   There is no height or weight on file to calculate BMI.     11/21/2023    3:01 PM 11/18/2022    3:03 PM 10/07/2021   10:42 PM 09/19/2021   11:09 AM 09/12/2020   12:52 PM 04/08/2020    2:28 AM 11/25/2019    9:31 AM  Advanced Directives  Does Patient Have a Medical Advance Directive? No No No No No  No No  Would patient like information on creating a medical advance directive? No - Patient declined  No - Patient declined No - Patient declined Yes (MAU/Ambulatory/Procedural Areas - Information given)  Yes (MAU/Ambulatory/Procedural Areas - Information given)    Current Medications (verified) Outpatient Encounter Medications as of 11/21/2023  Medication Sig   atorvastatin  (LIPITOR) 40 MG tablet TAKE 1 TABLET BY MOUTH ONCE DAILY AT  6  PM   Blood Glucose Monitoring Suppl (ONETOUCH VERIO FLEX SYSTEM) w/Device KIT 1 Device by Does not apply route in the morning, at noon, in the evening, and at bedtime.   carvedilol  (COREG ) 25 MG tablet Take 1 tablet by mouth twice daily   chlorthalidone (HYGROTON) 25 MG tablet Take 25 mg by mouth daily.   clopidogrel  (PLAVIX ) 75 MG tablet TAKE 1 TABLET BY MOUTH ONCE DAILY TAKE  WITH  ASPIRIN   FOR  THREE  MONTHS  THEN  STOP  ASPIRIN    Continuous Glucose Sensor (FREESTYLE LIBRE 2 PLUS SENSOR) MISC 1 Device by Does not apply route every 14 (fourteen) days.   gabapentin  (NEURONTIN ) 100 MG capsule Take 1 capsule (100 mg total) by mouth at bedtime.   glucose blood (ONETOUCH VERIO) test strip 1 each by Other route 3 (three) times daily. Use as instructed   Insulin  Infusion Pump (T:SLIM INSULIN  PUMP) DEVI by Does not apply route.   insulin  lispro (HUMALOG ) 100 UNIT/ML injection Max daily dose of 100 units via pump DX E10.59 (vials)   insulin  lispro (HUMALOG ) 100 UNIT/ML injection INJECT UNDER THE SKIN DAILY VIA PUMP. MAX DOSE PER DAY: 100 UNITS  lisinopril  (ZESTRIL ) 40 MG tablet Take 40 mg by mouth daily.   pantoprazole  (PROTONIX ) 40 MG tablet Take 1 tablet by mouth once daily   sodium bicarbonate 650 MG tablet Take 650 mg by mouth 2 (two) times daily.   spironolactone  (ALDACTONE ) 25 MG tablet Take 0.5 tablets (12.5 mg total) by mouth daily.   methocarbamol  (ROBAXIN ) 500 MG tablet Take 1 tablet (500 mg total) by mouth every 8 (eight) hours as needed for muscle spasms.  (Patient not taking: Reported on 11/21/2023)   No facility-administered encounter medications on file as of 11/21/2023.    Allergies (verified) Patient has no known allergies.   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   Sleep apnea    on BiPAP   Stroke (HCC) 2016   Past Surgical History:  Procedure Laterality Date   ANTERIOR APPROACH HEMI HIP ARTHROPLASTY Left 01/01/2019   Procedure: ANTERIOR APPROACH total hip ARTHROPLASTY;  Surgeon: Fidel Rogue, MD;  Location: [facility] OR;  Service: Orthopedics;  Laterality: Left;   CATARACT EXTRACTION Right 1994   CATARACT EXTRACTION Left 11/18/2023   CATARACT EXTRACTION W/ INTRAOCULAR LENS IMPLANT  1994   EYE SURGERY Right 1991   vitrectomy   EYE SURGERY Right 1992   scar tissue removed from retina   EYE SURGERY Right 1994   Cat Sx   Family History  Problem Relation Age of Onset   Hypertension Mother    Hyperlipidemia Mother    Hyperlipidemia Father    Hypertension Father    Diabetes Father    Kidney disease Father        had a kidney transplant    Stroke Brother    Diabetes Brother    Pulmonary fibrosis Paternal Grandmother    Lung cancer Paternal Grandfather    Diabetes Daughter    Colon cancer Neg Hx    Esophageal cancer Neg Hx    Rectal cancer Neg Hx    Stomach cancer Neg Hx    Social History   Socioeconomic History   Marital status: Married    Spouse name: Not on file   Number of children: 3   Years of education: Not on file   Highest education level: Associate degree: academic program  Occupational History   Occupation: Disabled  Tobacco Use   Smoking status:  Former    Current packs/day: 0.00    Average packs/day: 1 pack/day for 6.0 years (6.0 ttl pk-yrs)    Types: Cigarettes    Start date: 04/29/1985    Quit date: 04/30/1991    Years since quitting: 32.5   Smokeless tobacco: Never  Vaping Use   Vaping status: Never Used  Substance and Sexual Activity   Alcohol use: No   Drug use: Never   Sexual activity: Not on file  Other Topics Concern   Not on file  Social History Narrative   Right handed   Lives wife and kids in a two story home   Social Drivers of Health   Financial Resource Strain: Medium Risk (11/19/2023)   Overall Financial Resource  Strain (CARDIA)    Difficulty of Paying Living Expenses: Somewhat hard  Food Insecurity: No Food Insecurity (11/19/2023)   Hunger Vital Sign    Worried About Running Out of Food in the Last Year: Never true    Ran Out of Food in the Last Year: Never true  Transportation Needs: No Transportation Needs (11/19/2023)   PRAPARE - Administrator, Civil Service (Medical): No    Lack of Transportation (Non-Medical): No  Physical Activity: Inactive (11/19/2023)   Exercise Vital Sign    Days of Exercise per Week: 0 days    Minutes of Exercise per Session: 0 min  Stress: No Stress Concern Present (11/19/2023)   [employer] of Occupational Health - Occupational Stress Questionnaire    Feeling of Stress: Only a little  Social Connections: Socially Integrated (11/19/2023)   Social Connection and Isolation Panel    Frequency of Communication with Friends and Family: More than three times a week    Frequency of Social Gatherings with Friends and Family: Once a week    Attends Religious Services: More than 4 times per year    Active Member of [employer] or Organizations: Yes    Attends Engineer, structural: More than 4 times per year    Marital Status: Married    Tobacco Counseling Counseling given: Not Answered    Clinical Intake:  Pre-visit preparation completed: Yes  Pain :  No/denies pain     Nutritional Risks: None Diabetes: Yes CBG done?: No Did pt. bring in CBG monitor from home?: No  Lab Results  Component Value Date   HGBA1C 7.1 (A) 06/24/2023   HGBA1C 7.0 (A) 11/27/2022   HGBA1C 7.2 (A) 05/29/2022     How often do you need to have someone help you when you read instructions, pamphlets, or other written materials from your doctor or pharmacy?: 1 - Never  Interpreter Needed?: No  Information entered by :: NAllen LPN   Activities of Daily Living     11/19/2023    8:45 PM  In your present state of health, do you have any difficulty performing the following activities:  Hearing? 0  Vision? 0  Difficulty concentrating or making decisions? 0  Walking or climbing stairs? 1  Dressing or bathing? 0  Doing errands, shopping? 0  Preparing Food and eating ? N  Using the Toilet? N  In the past six months, have you accidently leaked urine? N  Do you have problems with loss of bowel control? N  Managing your Medications? N  Managing your Finances? N  Housekeeping or managing your Housekeeping? Y    Patient Care Team: Berneta Elsie Sayre, MD as PCP - General (Family Medicine)  I have updated your Care Teams any recent Medical Services you may have received from other providers in the past year.     Assessment:   This is a routine wellness examination for [place].  Hearing/Vision screen Hearing Screening - Comments:: Denies hearing issues Vision Screening - Comments:: Regular eye exams, [facility]   Goals Addressed             This Visit's Progress    Patient Stated       11/21/2023, denies goals       Depression Screen     11/21/2023    3:02 PM 11/07/2023    9:11 AM 11/18/2022    3:05 PM 10/01/2022   11:23 AM 09/02/2022    3:34 PM 03/04/2022    3:52 PM  10/19/2021   11:31 AM  PHQ 2/9 Scores  PHQ - 2 Score 0 0 0 0 0 0 0  PHQ- 9 Score 0  0        Fall Risk     11/19/2023    8:45 PM 11/07/2023    9:10 AM 11/18/2022    3:04 PM  10/01/2022   11:23 AM 09/02/2022    3:34 PM  Fall Risk   Falls in the past year? 0 0 0 0 0  Number falls in past yr: 0 0 0 0 0  Injury with Fall? 0 0 0 0 0  Risk for fall due to : Medication side effect No Fall Risks Medication side effect No Fall Risks No Fall Risks  Follow up Falls prevention discussed;Falls evaluation completed Falls evaluation completed Falls prevention discussed;Falls evaluation completed Falls evaluation completed Falls evaluation completed    MEDICARE RISK AT HOME:  Medicare Risk at Home Any stairs in or around the home?: (Patient-Rptd) Yes If so, are there any without handrails?: (Patient-Rptd) No Home free of loose throw rugs in walkways, pet beds, electrical cords, etc?: (Patient-Rptd) No Adequate lighting in your home to reduce risk of falls?: (Patient-Rptd) Yes Life alert?: (Patient-Rptd) No Use of a cane, walker or w/c?: (Patient-Rptd) Yes Grab bars in the bathroom?: (Patient-Rptd) Yes Shower chair or bench in shower?: (Patient-Rptd) Yes Elevated toilet seat or a handicapped toilet?: (Patient-Rptd) No  TIMED UP AND GO:  Was the test performed?  No  Cognitive Function: 6CIT completed        11/21/2023    3:03 PM 11/18/2022    3:05 PM  6CIT Screen  What Year? 0 points 0 points  What month? 0 points 0 points  What time? 0 points 0 points  Count back from 20 0 points 0 points  Months in reverse 0 points 0 points  Repeat phrase 0 points 2 points  Total Score 0 points 2 points    Immunizations Immunization History  Administered Date(s) Administered   Influenza,inj,Quad PF,6+ Mos 07/15/2018, 01/08/2019, 02/27/2021, 03/04/2022   Influenza-Unspecified 05/29/2020   PFIZER Comirnaty(Gray Top)Covid-19 Tri-Sucrose Vaccine 07/09/2019, 07/29/2019, 05/29/2020   PNEUMOCOCCAL CONJUGATE-20 08/08/2023   Pneumococcal Polysaccharide-23 03/02/2019   Pneumococcal-Unspecified 11/29/2020   Rabies Immune Globulin 04/17/2022, 04/17/2022   Tdap 04/29/2017   Zoster  Recombinant(Shingrix) 11/27/2020    Screening Tests Health Maintenance  Topic Date Due   Hepatitis B Vaccines (1 of 3 - 19+ 3-dose series) Never done   Zoster Vaccines- Shingrix (2 of 2) 01/22/2021   INFLUENZA VACCINE  11/28/2023   HEMOGLOBIN A1C  12/22/2023   OPHTHALMOLOGY EXAM  04/17/2024   FOOT EXAM  06/23/2024   Diabetic kidney evaluation - eGFR measurement  08/07/2024   Diabetic kidney evaluation - Urine ACR  08/07/2024   Medicare Annual Wellness (AWV)  11/20/2024   Colonoscopy  07/01/2025   DTaP/Tdap/Td (2 - Td or Tdap) 04/30/2027   Pneumococcal Vaccine 49-52 Years old  Completed   HIV Screening  Completed   HPV VACCINES  Aged Out   Meningococcal B Vaccine  Aged Out   COVID-19 Vaccine  Discontinued   Hepatitis C Screening  Discontinued    Health Maintenance  Health Maintenance Due  Topic Date Due   Hepatitis B Vaccines (1 of 3 - 19+ 3-dose series) Never done   Zoster Vaccines- Shingrix (2 of 2) 01/22/2021   Health Maintenance Items Addressed: Due for second shingles and hep b vaccines.  Additional Screening:  Vision Screening: Recommended annual  ophthalmology exams for early detection of glaucoma and other disorders of the eye. Would you like a referral to an eye doctor? No    Dental Screening: Recommended annual dental exams for proper oral hygiene  Community Resource Referral / Chronic Care Management: CRR required this visit?  No   CCM required this visit?  No   Plan:    I have personally reviewed and noted the following in the patient's chart:   Medical and social history Use of alcohol, tobacco or illicit drugs  Current medications and supplements including opioid prescriptions. Patient is not currently taking opioid prescriptions. Functional ability and status Nutritional status Physical activity Advanced directives List of other physicians Hospitalizations, surgeries, and ER visits in previous 12 months Vitals Screenings to include  cognitive, depression, and falls Referrals and appointments  In addition, I have reviewed and discussed with patient certain preventive protocols, quality metrics, and best practice recommendations. A written personalized care plan for preventive services as well as general preventive health recommendations were provided to patient.   Ardella FORBES Dawn, LPN   [id number]   After Visit Summary: (MyChart) Due to this being a telephonic visit, the after visit summary with patients personalized plan was offered to patient via MyChart   Notes: Nothing significant to report at this time.

## 2023-11-21 NOTE — Patient Instructions (Signed)
 Adrian Neal , Thank you for taking time out of your busy schedule to complete your Annual Wellness Visit with me. I enjoyed our conversation and look forward to speaking with you again next year. I, as well as your care team,  appreciate your ongoing commitment to your health goals. Please review the following plan we discussed and let me know if I can assist you in the future. Your Game plan/ To Do List    Referrals: If you haven't heard from the office you've been referred to, please reach out to them at the phone provided.  N/a Follow up Visits: Next Medicare AWV with our clinical staff: 11/26/2024 at 3:00   Have you seen your provider in the last 6 months (3 months if uncontrolled diabetes)? Yes Next Office Visit with your provider: 02/09/2024 at 9:20  Clinician Recommendations:  Aim for 30 minutes of exercise or brisk walking, 6-8 glasses of water , and 5 servings of fruits and vegetables each day.       This is a list of the screening recommended for you and due dates:  Health Maintenance  Topic Date Due   Hepatitis B Vaccine (1 of 3 - 19+ 3-dose series) Never done   Zoster (Shingles) Vaccine (2 of 2) 01/22/2021   Flu Shot  11/28/2023   Hemoglobin A1C  12/22/2023   Eye exam for diabetics  04/17/2024   Complete foot exam   06/23/2024   Yearly kidney function blood test for diabetes  08/07/2024   Yearly kidney health urinalysis for diabetes  08/07/2024   Medicare Annual Wellness Visit  11/20/2024   Colon Cancer Screening  07/01/2025   DTaP/Tdap/Td vaccine (2 - Td or Tdap) 04/30/2027   Pneumococcal Vaccination  Completed   HIV Screening  Completed   HPV Vaccine  Aged Out   Meningitis B Vaccine  Aged Out   COVID-19 Vaccine  Discontinued   Hepatitis C Screening  Discontinued    Advanced directives: (ACP Link)Information on Advanced Care Planning can be found at [place]  Secretary of [facility] Advance Health Care Directives Advance Health Care Directives. [email or web address]  Advance  Care Planning is important because it:  [x]  Makes sure you receive the medical care that is consistent with your values, goals, and preferences  [x]  It provides guidance to your family and loved ones and reduces their decisional burden about whether or not they are making the right decisions based on your wishes.  Follow the link provided in your after visit summary or read over the paperwork we have mailed to you to help you started getting your Advance Directives in place. If you need assistance in completing these, please reach out to us  so that we can help you!  See attachments for Preventive Care and Fall Prevention Tips.

## 2023-12-02 ENCOUNTER — Ambulatory Visit: Admitting: Physical Therapy

## 2023-12-08 ENCOUNTER — Encounter: Payer: Self-pay | Admitting: Podiatry

## 2023-12-15 DIAGNOSIS — E109 Type 1 diabetes mellitus without complications: Secondary | ICD-10-CM | POA: Diagnosis not present

## 2023-12-17 ENCOUNTER — Ambulatory Visit: Admitting: Physical Therapy

## 2023-12-18 ENCOUNTER — Other Ambulatory Visit: Payer: Self-pay | Admitting: Family Medicine

## 2023-12-18 DIAGNOSIS — I10 Essential (primary) hypertension: Secondary | ICD-10-CM

## 2023-12-22 ENCOUNTER — Other Ambulatory Visit: Payer: Self-pay

## 2023-12-22 ENCOUNTER — Ambulatory Visit: Attending: Family Medicine

## 2023-12-22 DIAGNOSIS — R262 Difficulty in walking, not elsewhere classified: Secondary | ICD-10-CM | POA: Insufficient documentation

## 2023-12-22 DIAGNOSIS — M25652 Stiffness of left hip, not elsewhere classified: Secondary | ICD-10-CM | POA: Insufficient documentation

## 2023-12-22 DIAGNOSIS — R2681 Unsteadiness on feet: Secondary | ICD-10-CM | POA: Insufficient documentation

## 2023-12-22 DIAGNOSIS — R293 Abnormal posture: Secondary | ICD-10-CM | POA: Diagnosis not present

## 2023-12-22 NOTE — Therapy (Signed)
 OUTPATIENT PHYSICAL THERAPY LOWER EXTREMITY EVALUATION   Patient Name: Adrian Neal MRN: [id number] DOB:27-Dec-1965, 58 y.o., male Today's Date: 12/22/2023  END OF SESSION:  PT End of Session - 12/22/23 1019     Visit Number 1    Date for PT Re-Evaluation 03/15/24    Progress Note Due on Visit 10    PT Start Time 1019    PT Stop Time 1100    PT Time Calculation (min) 41 min    Activity Tolerance Patient tolerated treatment well    Behavior During Therapy WFL for tasks assessed/performed          Past Medical History:  Diagnosis Date   Cataract    Mixed form OS   Chronic kidney disease    Diabetes mellitus without complication (HCC)    diagnosed at age 8   Eye problems    Heart murmur 1996   High cholesterol    patient denies but take preventative medicine   Hypertension    Hypertensive retinopathy    OU   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   Sleep apnea    on BiPAP   Stroke (HCC) 2016   Past Surgical History:  Procedure Laterality Date   ANTERIOR APPROACH HEMI HIP ARTHROPLASTY Left 01/01/2019   Procedure: ANTERIOR APPROACH total hip ARTHROPLASTY;  Surgeon: Fidel Rogue, MD;  Location: [facility];  Service: Orthopedics;  Laterality: Left;   CATARACT EXTRACTION Right 1994   CATARACT EXTRACTION Left 11/18/2023   CATARACT EXTRACTION W/ INTRAOCULAR LENS IMPLANT  1994   EYE SURGERY Right 1991   vitrectomy   EYE SURGERY Right 1992   scar tissue removed from retina   EYE SURGERY Right 1994   Cat Sx   Patient Active Problem List   Diagnosis Date Noted   Trapezius strain 09/02/2022   Pneumonia of left lower lobe due to infectious organism 10/09/2021   Achrochordon 08/15/2021   Midline low back pain without sciatica 04/12/2021   Ataxia 02/01/2021   Obstructive sleep apnea 07/13/2020   Anemia 01/04/2020   Medication side effect 01/04/2020   Osteopenia 10/04/2019   Patellofemoral pain syndrome of both knees 06/08/2019   Pain in  both knees 06/03/2019   Excessive daytime sleepiness 04/12/2019   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   Seasonal allergic rhinitis 03/11/2019   Need for pneumococcal vaccination 03/11/2019   Fracture of neck of femur (HCC) 01/13/2019   AKI (acute kidney injury) (HCC) 01/08/2019   Acute blood loss anemia 01/08/2019   History of femur fracture 01/08/2019   Need for influenza vaccination 01/08/2019   Hospital discharge follow-up 01/08/2019   Closed left femoral fracture (HCC) 12/31/2018   Type 1 diabetes mellitus with diabetic neuropathy, unspecified (HCC) 12/17/2018   Type 1 diabetes mellitus with stage 3 chronic kidney disease (HCC) 08/12/2018   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) 08/12/2018   Type 1 diabetes mellitus with hyperglycemia (HCC) 08/12/2018   Vitamin D  deficiency 07/15/2018   Stage 3b chronic kidney disease (HCC) 06/16/2018   Screening for gout 06/15/2018   History of CVA (cerebrovascular accident) 06/01/2018   OSA (obstructive sleep apnea) 03/29/2015   Stroke (HCC)    Hypothyroidism 01/19/2015   Mixed hyperlipidemia 11/22/2014   Insulin  dependent diabetes mellitus    Intractable nausea and vomiting 07/27/2014   Vertigo 07/27/2014   Essential hypertension 07/27/2014   Type 1 diabetes mellitus (HCC)  05/29/2010   Hyperlipidemia 05/29/2010   Plantar fasciitis 05/29/2010    PCP: Berneta Elsie Sayre MD  REFERRING PROVIDER: same  REFERRING DIAG: gait instability  THERAPY DIAG:  Difficulty in walking, not elsewhere classified  Stiffness of left hip, not elsewhere classified  Abnormal posture  Rationale for Evaluation and Treatment: Rehabilitation  ONSET DATE: CVA in 2016, fractured hip and THA 2018.  Pt reports decline in function, also more intensifying lower back pain since that time.  SUBJECTIVE:   SUBJECTIVE STATEMENT:  I think when I had my stroke in 2016 I  couldn't walk.  The therapy ran out and I haven't done anything since then. The balance is the biggest issue with me. I have a really hard time maneuvering steps , do ok with B handrails  PERTINENT HISTORY: CVA in 2016 which affected L side, h/o L hip replacement  PAIN:  Are you having pain? Yes: NPRS scale: 0 to 3/10 Pain location: lower back  Pain description: aching  Aggravating factors: activity Relieving factors: had steroid injection  PRECAUTIONS: None  RED FLAGS: None   WEIGHT BEARING RESTRICTIONS: No  FALLS:  Has patient fallen in last 6 months? 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  DIAGNOSTIC FINDINGS: MRI from 08/24/2023 which showed mild foraminal narrowing bilaterally at L5-S1 without clear neurologic impingement, and multilevel spondylosis with possible left L3 nerve root encroachment   PATIENT SURVEYS:  Lower Extremity Functional Score: 39 / 80 = 48.8 %  COGNITION: Overall cognitive status: Within functional limits for tasks assessed     SENSATION: Reports numbness R foot lat 3 toes, chronic    MUSCLE LENGTH: Hamstrings: Right nt deg; Left nt deg Thomas test: Right wnl deg; Left -10 deg  POSTURE: standing wide base of support, L foot with marked IR  PALPATION: Non tender to palpation L post /lat hip  LOWER EXTREMITY ROM:  Passive ROM Right eval Left eval  Hip flexion    Hip extension  -5  Hip abduction  20  Hip adduction    Hip internal rotation    Hip external rotation  nt  Knee flexion    Knee extension     Ankle dorsiflexion    Ankle plantarflexion    Ankle inversion    Ankle eversion     (Blank rows = not tested)  LOWER EXTREMITY MMT:  MMT Right eval Left eval  Hip flexion 4 4  Hip extension lock bridge  wnl 3-  Hip abduction  4  Hip adduction    Hip internal rotation    Hip external rotation  4-  Knee flexion    Knee extension    Ankle dorsiflexion wnl wnl  Ankle plantarflexion toe walking  4 3-  Ankle inversion    Ankle eversion     (Blank rows = not tested)   FUNCTIONAL TESTS:  Timed up and go (TUG): 18.08sec FGA 18/30  GAIT: Distance walked: in clinic , up to 70' Assistive device utilized: None Level of assistance: Modified independence Comments: labored gait pattern, marked L lateral trunk side bending with L stance,  arms in mid guard, L LE IR throughout , wide base of support, decreased stance time on L, shortened step length B, more so on R                                                                                                                                TREATMENT DATE: 12/22/23:  PT evaluation, instructed in 2 activities/ therex to perform at home until next session:  Forward step ups L with R knee drivers L foot on step for weight shift for/ back    PATIENT EDUCATION:  Education details: POC, goals Person educated: Patient Education method: Medical illustrator Education comprehension: verbalized understanding  HOME EXERCISE PROGRAM: TBD  ASSESSMENT:  CLINICAL IMPRESSION: Patient is a 58 y.o. male who was evaluated  today by physical therapy due to unsteady gait pattern, precipitated by CVA with L sided deficits in 2016 a fall with L hip fx and replacement in 2018.  He has postural changes, also quite weak L posterior hip musculature, balance deficits, and altered gait mechanics which contribute to a laborious gait. He should benefit from skilled PT to address his deficits.    OBJECTIVE IMPAIRMENTS: decreased activity tolerance,  decreased balance, decreased coordination, decreased endurance, difficulty walking, decreased ROM, decreased strength, hypomobility, impaired perceived functional ability, impaired flexibility, impaired sensation, impaired vision/preception, improper body mechanics, postural dysfunction, and pain.   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   REHAB POTENTIAL: Good  CLINICAL DECISION MAKING: Evolving/moderate complexity  EVALUATION COMPLEXITY: Moderate   GOALS: Goals reviewed with patient? Yes  SHORT TERM GOALS: Target date: 2 weeks 01/05/24 I HEP Baseline: Goal status: INITIAL   LONG TERM GOALS: Target date: 12 weeks, 03/15/24  FGA improve from 18/30 to 24/30 or greater Baseline:  Goal status: INITIAL  2.  Improve L hip extension strength and L plantarflexion strength to 4/5 or greater for better stability, endurance, stair negotiation Baseline:  Goal status: INITIAL  3.  Tug score improve to 14 sec or less Baseline: 18.08 Goal status: INITIAL  4.  Lower Extremity Functional Score: 39 / 80 = 48.8 %improve to 50/80 or greater  Baseline:  Goal status: INITIAL   PLAN:  PT FREQUENCY: 2x/week  PT DURATION: 12 weeks  PLANNED INTERVENTIONS: 97110-Therapeutic exercises, 97530- Therapeutic activity, 97112- Neuromuscular re-education, 97535- Self Care, [id number]- Manual therapy, [phone]- Gait training, Patient/Family education, Balance training, and Stair training  PLAN FOR NEXT SESSION: initiate cardiovascular training, focused strengthening for  posterior chain musculature L Le, unilateral balance, light stretching L hip anterior and medial structures    Olamide Carattini L Shanin Szymanowski, PT, DPT, OCS 12/22/2023, 11:25 AM

## 2023-12-23 ENCOUNTER — Ambulatory Visit: Payer: PPO | Admitting: Internal Medicine

## 2024-01-01 DIAGNOSIS — Z8673 Personal history of transient ischemic attack (TIA), and cerebral infarction without residual deficits: Secondary | ICD-10-CM | POA: Diagnosis not present

## 2024-01-01 DIAGNOSIS — N184 Chronic kidney disease, stage 4 (severe): Secondary | ICD-10-CM | POA: Diagnosis not present

## 2024-01-01 DIAGNOSIS — E872 Acidosis, unspecified: Secondary | ICD-10-CM | POA: Diagnosis not present

## 2024-01-01 DIAGNOSIS — E1022 Type 1 diabetes mellitus with diabetic chronic kidney disease: Secondary | ICD-10-CM | POA: Diagnosis not present

## 2024-01-01 DIAGNOSIS — I129 Hypertensive chronic kidney disease with stage 1 through stage 4 chronic kidney disease, or unspecified chronic kidney disease: Secondary | ICD-10-CM | POA: Diagnosis not present

## 2024-01-01 DIAGNOSIS — E875 Hyperkalemia: Secondary | ICD-10-CM | POA: Diagnosis not present

## 2024-01-02 ENCOUNTER — Encounter: Payer: Self-pay | Admitting: Internal Medicine

## 2024-01-02 ENCOUNTER — Ambulatory Visit: Admitting: Internal Medicine

## 2024-01-02 VITALS — BP 122/68 | HR 88 | Ht 67.0 in | Wt 194.0 lb

## 2024-01-02 DIAGNOSIS — Z9641 Presence of insulin pump (external) (internal): Secondary | ICD-10-CM | POA: Diagnosis not present

## 2024-01-02 DIAGNOSIS — E1059 Type 1 diabetes mellitus with other circulatory complications: Secondary | ICD-10-CM

## 2024-01-02 DIAGNOSIS — E10319 Type 1 diabetes mellitus with unspecified diabetic retinopathy without macular edema: Secondary | ICD-10-CM | POA: Diagnosis not present

## 2024-01-02 DIAGNOSIS — Z794 Long term (current) use of insulin: Secondary | ICD-10-CM | POA: Diagnosis not present

## 2024-01-02 DIAGNOSIS — N184 Chronic kidney disease, stage 4 (severe): Secondary | ICD-10-CM | POA: Diagnosis not present

## 2024-01-02 DIAGNOSIS — E1022 Type 1 diabetes mellitus with diabetic chronic kidney disease: Secondary | ICD-10-CM

## 2024-01-02 LAB — POCT GLYCOSYLATED HEMOGLOBIN (HGB A1C): Hemoglobin A1C: 6.7 % — AB (ref 4.0–5.6)

## 2024-01-02 NOTE — Patient Instructions (Signed)

## 2024-01-02 NOTE — Progress Notes (Signed)
 Name: Adrian Neal  Age/ Sex: 58 y.o., male   MRN/ DOB: [id number], 03-01-1966     PCP: Berneta Elsie Sayre, MD   Reason for Endocrinology Evaluation: Type 1 Diabetes Mellitus  Initial Endocrine Consultative Visit: 06/18/2018    PATIENT IDENTIFIER: Adrian Neal is a 58 y.o. male with a past medical history of .HTN,Hyperlipidemia and Hx of CVA  The patient has followed with Endocrinology clinic since 06/18/2018 for consultative assistance with management of his diabetes.  DIABETIC HISTORY:  [name] was diagnosed with T1DM at age 61, he has been on insulin  pump since 2012. His hemoglobin A1c has ranged from 7.2% , peaking at 9.0%   In 11/2018 he switched to the T-Slim   SUBJECTIVE:   During the last visit (06/24/2023): A1c 7.1 %    Today (01/02/2024): Mr. Mimbs is here for a follow up on diabetes management and his new pump.  He checks his blood sugars multiple times daily, through CGM. The patient has has been noted with rare hypoglycemic episodes, these are usually during the day with delayed carbohydrate entry.    Continues to follow-up with nephrology with CKD IV ( Dr. Macel' ) Had a follow-up with podiatry 11/16/2023   Denies nausea or vomiting  Denies constipation or diarrhea  Had cataract sx ~ 6 weeks ago   This patient with type 1 diabetes is treated with humalog  (insulin  pump). The clinical list was updated.     Current pump settings      Pump   T-Slim      Insulin  type   HUMALOG     Basal rate       0000   0.900 u/h    0600 1.0u/h   1100 1.0      I:C ratio   0000 1:7   0600 1:5   1100 1:7      Sensitivity      0000 30      AIT      0000  5      Goal       0000  110       Type & Model of Pump:T-Slim  Insulin  Type: Currently using Humalog    PUMP STATISTICS:   Average BG Dashboard : 170 Average daily Carbs 151 Average Total Daily Insulin :  81.33 Average Daily Basal: 24.72(30 %) Average Daily Bolus: 56.61 (70  %)   CONTINUOUS GLUCOSE MONITORING RECORD INTERPRETATION    Dates of Recording:8/23- 01/02/2024  Sensor description:dexcom  Results statistics:   CGM use % of time 95  Average and SD 170/56  Time in range 65%  % Time Above 180 25  % Time Below target 0.5   Glycemic patterns summary: BGs are optimal overnight but fluctuate during the day  Hyperglycemic episodes  postprandial   Hypoglycemic episodes occurred with a bolus- rare  Overnight periods: optimal     Statin: yes ACE-I/ARB: yes      DIABETIC COMPLICATIONS: Microvascular complications:  CKD IV, 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  Denies: Neuropathy  Last eye exam: Completed 12/2022   Macrovascular complications:  CVA Denies: CAD, PVD       HISTORY:  Past Medical History:  Past Medical History:  Diagnosis Date   Cataract    Mixed form OS   Chronic kidney disease    Diabetes mellitus without complication (HCC)    diagnosed at age 38   Eye problems    Heart murmur 1996   High cholesterol  patient denies but take preventative medicine   Hypertension    Hypertensive retinopathy    OU   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   Sleep apnea    on BiPAP   Stroke (HCC) 2016   Past Surgical History:  Past Surgical History:  Procedure Laterality Date   ANTERIOR APPROACH HEMI HIP ARTHROPLASTY Left 01/01/2019   Procedure: ANTERIOR APPROACH total hip ARTHROPLASTY;  Surgeon: Fidel Rogue, MD;  Location: [facility] OR;  Service: Orthopedics;  Laterality: Left;   CATARACT EXTRACTION Right 1994   CATARACT EXTRACTION Left 11/18/2023   CATARACT EXTRACTION W/ INTRAOCULAR LENS IMPLANT  1994   EYE SURGERY Right 1991   vitrectomy   EYE SURGERY Right 1992   scar tissue removed from retina   EYE SURGERY Right 1994   Cat Sx   Social History:  reports that he quit smoking about 32 years ago. His smoking use included cigarettes. He started smoking about 38 years ago. He has a 6 pack-year smoking history. He has  never used smokeless tobacco. He reports that he does not drink alcohol and does not use drugs. Family History:  Family History  Problem Relation Age of Onset   Hypertension Mother    Hyperlipidemia Mother    Hyperlipidemia Father    Hypertension Father    Diabetes Father    Kidney disease Father        had a kidney transplant    Stroke Brother    Diabetes Brother    Pulmonary fibrosis Paternal Grandmother    Lung cancer Paternal Grandfather    Diabetes Daughter    Colon cancer Neg Hx    Esophageal cancer Neg Hx    Rectal cancer Neg Hx    Stomach cancer Neg Hx     HOME MEDICATIONS: Allergies as of 01/02/2024   No Known Allergies      Medication List        Accurate as of January 02, 2024 11:47 AM. If you have any questions, ask your nurse or doctor.          atorvastatin  40 MG tablet Commonly known as: LIPITOR TAKE 1 TABLET BY MOUTH ONCE DAILY AT  6  PM   carvedilol  25 MG tablet Commonly known as: COREG  Take 1 tablet by mouth twice daily   chlorthalidone 25 MG tablet Commonly known as: HYGROTON Take 25 mg by mouth daily.   clopidogrel  75 MG tablet Commonly known as: PLAVIX  TAKE 1 TABLET BY MOUTH ONCE DAILY TAKE  WITH  ASPIRIN   FOR  THREE  MONTHS  THEN  STOP  ASPIRIN    FreeStyle Libre 2 Plus Sensor Misc 1 Device by Does not apply route every 14 (fourteen) days.   gabapentin  100 MG capsule Commonly known as: NEURONTIN  Take 1 capsule (100 mg total) by mouth at bedtime.   insulin  lispro 100 UNIT/ML injection Commonly known as: HumaLOG  Max daily dose of 100 units via pump DX E10.59 (vials)   HumaLOG  100 UNIT/ML injection Generic drug: insulin  lispro INJECT UNDER THE SKIN DAILY VIA PUMP. MAX DOSE PER DAY: 100 UNITS   lisinopril  40 MG tablet Commonly known as: ZESTRIL  Take 40 mg by mouth daily.   methocarbamol  500 MG tablet Commonly known as: ROBAXIN  Take 1 tablet (500 mg total) by mouth every 8 (eight) hours as needed for muscle spasms.   OneTouch  Verio Flex System w/Device Kit 1 Device by Does not apply route in the morning, at noon, in the  evening, and at bedtime.   OneTouch Verio test strip Generic drug: glucose blood 1 each by Other route 3 (three) times daily. Use as instructed   pantoprazole  40 MG tablet Commonly known as: PROTONIX  Take 1 tablet by mouth once daily   sodium bicarbonate 650 MG tablet Take 650 mg by mouth 2 (two) times daily.   spironolactone  25 MG tablet Commonly known as: ALDACTONE  Take 0.5 tablets (12.5 mg total) by mouth daily.   T:slim Insulin  Pump Devi by Does not apply route.       PHYSICAL EXAM: VS: BP 122/68 (BP Location: Left Arm, Patient Position: Sitting, Cuff Size: Normal)   Pulse 88   Ht 5' 7 (1.702 m)   Wt 194 lb (88 kg)   SpO2 99%   BMI 30.38 kg/m    EXAM: General: Pt appears well and is in NAD  Lungs: Clear with good BS bilat  Heart: Auscultation: RRR   Extremities:  BL LE: no pretibial edema   Mental Status: Judgment, insight: intact Orientation: oriented to time, place, and person Mood and affect: no depression, anxiety, or agitation   DM Foot Exam 11/20/2023 per podiatry    DATA REVIEWED:  Lab Results  Component Value Date   HGBA1C 6.7 (A) 01/02/2024   HGBA1C 7.1 (A) 06/24/2023   HGBA1C 7.0 (A) 11/27/2022    Latest Reference Range & Units 08/08/23 12:01  Sodium 135 - 145 mEq/L 139  Potassium 3.5 - 5.1 mEq/L 4.5  Chloride 96 - 112 mEq/L 105  CO2 19 - 32 mEq/L 25  Glucose 70 - 99 mg/dL 878 (H)  BUN 6 - 23 mg/dL 56 (H)  Creatinine 9.59 - 1.50 mg/dL 7.68 (H)  Calcium  8.4 - 10.5 mg/dL 9.1  Alkaline Phosphatase 39 - 117 U/L 95  Albumin 3.5 - 5.2 g/dL 4.4  AST 0 - 37 U/L 16  ALT 0 - 53 U/L 20  Total Protein 6.0 - 8.3 g/dL 6.7  Total Bilirubin 0.2 - 1.2 mg/dL 0.5  GFR >39.99 mL/min 30.55 (L)    Latest Reference Range & Units 08/08/23 12:01  Total CHOL/HDL Ratio  4  Cholesterol 0 - 200 mg/dL 864  HDL Cholesterol >60.99 mg/dL 63.09 (L)  LDL (calc) 0 - 99  mg/dL 78  Direct LDL mg/dL 17.9  MICROALB/CREAT RATIO 0.0 - 30.0 mg/g 37.0 (H)  NonHDL  98.09  Triglycerides 0.0 - 149.0 mg/dL 898.9  VLDL 0.0 - 59.9 mg/dL 79.7    Old records , labs and images have been reviewed.    ASSESSMENT / PLAN / RECOMMENDATIONS:   1) Type 1 Diabetes Mellitus, Optimally controlled, With CKD IV, retinopathic and  macrovascular complications - Most recent A1c of 6.7 %. Goal A1c < 7.5 %.   - His A1c remains  stable and at goal -No changes at this time  MEDICATIONS:    Pump   T-Slim      Insulin  type   HUMALOG     Basal rate       0000   0.900 u/h    0600 1.0u/h   1100 1.0      I:C ratio   0000 1:7   0600 1:5   1100 1:7      Sensitivity      0000 30      AIT      0000  5      Goal       0000  110      EDUCATION / INSTRUCTIONS: BG monitoring  instructions: Patient is instructed to check his blood sugars 4 times a day, before meals and bedtime. Call [facility] if: BG persistently < 70  I reviewed the Rule of 15 for the treatment of hypoglycemia in detail with the patient. Literature supplied.    F/U in 6 months    Signed electronically by: Stefano Redgie Butts, MD  [facility]  [facility] [address] [place] [place], [place] [address] Phone: [phone] FAX: [phone]   CC: Berneta Elsie Sayre, MD [address] [place] [address] Phone: [phone]  Fax: [phone]  Return to Endocrinology clinic as below: Future Appointments  Date Time Provider Department Center  01/05/2024  3:15 PM [place] Tanda Console, PTA [facility]  01/07/2024 11:00 AM [place] Tanda Console, PTA [facility]  01/12/2024 11:45 AM [place] Tanda Console, PTA [facility]  01/14/2024 11:00 AM Speaks, Amy L, PT [facility]  01/19/2024 11:00 AM Speaks, Amy L, PT [facility]  01/21/2024 11:00 AM [place] Tanda Console, PTA [facility]  02/09/2024  9:20 AM  Berneta Elsie Sayre, MD [facility]  03/23/2024 10:15 AM Silva Juliene SAUNDERS, DPM [facility]  04/16/2024  9:00 AM Valdemar Rogue, MD [facility] None  11/26/2024  3:00 PM [facility]ANNUAL WELLNESS VISIT [facility]

## 2024-01-03 ENCOUNTER — Other Ambulatory Visit: Payer: Self-pay | Admitting: Family Medicine

## 2024-01-03 DIAGNOSIS — K219 Gastro-esophageal reflux disease without esophagitis: Secondary | ICD-10-CM

## 2024-01-05 ENCOUNTER — Ambulatory Visit: Admitting: Physical Therapy

## 2024-01-07 ENCOUNTER — Encounter: Payer: Self-pay | Admitting: Physical Therapy

## 2024-01-07 ENCOUNTER — Ambulatory Visit: Attending: Family Medicine | Admitting: Physical Therapy

## 2024-01-07 DIAGNOSIS — R293 Abnormal posture: Secondary | ICD-10-CM | POA: Insufficient documentation

## 2024-01-07 DIAGNOSIS — R262 Difficulty in walking, not elsewhere classified: Secondary | ICD-10-CM | POA: Diagnosis not present

## 2024-01-07 DIAGNOSIS — M25652 Stiffness of left hip, not elsewhere classified: Secondary | ICD-10-CM | POA: Insufficient documentation

## 2024-01-07 NOTE — Therapy (Signed)
 OUTPATIENT PHYSICAL THERAPY LOWER EXTREMITY TREATMENT   Patient Name: Adrian Neal MRN: [id number] DOB:May 03, 1965, 58 y.o., male Today's Date: 01/07/2024  END OF SESSION:  PT End of Session - 01/07/24 1103     Visit Number 2    Date for PT Re-Evaluation 03/15/24    PT Start Time 1103    PT Stop Time 1145    PT Time Calculation (min) 42 min    Activity Tolerance Patient tolerated treatment well    Behavior During Therapy WFL for tasks assessed/performed          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   Sleep apnea    on BiPAP   Stroke (HCC) 2016   Past Surgical History:  Procedure Laterality Date   ANTERIOR APPROACH HEMI HIP ARTHROPLASTY Left 01/01/2019   Procedure: ANTERIOR APPROACH total hip ARTHROPLASTY;  Surgeon: Fidel Rogue, MD;  Location: [facility] OR;  Service: Orthopedics;  Laterality: Left;   CATARACT EXTRACTION Right 1994   CATARACT EXTRACTION Left 11/18/2023   CATARACT EXTRACTION W/ INTRAOCULAR LENS IMPLANT  1994   EYE SURGERY Right 1991   vitrectomy   EYE SURGERY Right 1992   scar tissue removed from retina   EYE SURGERY Right 1994   Cat Sx   Patient Active Problem List   Diagnosis Date Noted   Trapezius strain 09/02/2022   Pneumonia of left lower lobe due to infectious organism 10/09/2021   Achrochordon 08/15/2021   Midline low back pain without sciatica 04/12/2021   Ataxia 02/01/2021   Obstructive sleep apnea 07/13/2020   Anemia 01/04/2020   Medication side effect 01/04/2020   Osteopenia 10/04/2019   Patellofemoral pain syndrome of both knees 06/08/2019   Pain in both knees 06/03/2019   Excessive  daytime sleepiness 04/12/2019   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   Seasonal allergic rhinitis 03/11/2019   Need for pneumococcal vaccination 03/11/2019   Fracture of neck of femur (HCC) 01/13/2019   AKI (acute kidney injury) (HCC) 01/08/2019   Acute blood loss anemia 01/08/2019   History of femur fracture 01/08/2019   Need for influenza vaccination 01/08/2019   Hospital discharge follow-up 01/08/2019   Closed left femoral fracture (HCC) 12/31/2018   Type 1 diabetes mellitus with diabetic neuropathy, unspecified (HCC) 12/17/2018   Type 1 diabetes mellitus with stage 3 chronic kidney disease (HCC) 08/12/2018   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) 08/12/2018   Type 1 diabetes mellitus with hyperglycemia (HCC) 08/12/2018   Vitamin D  deficiency 07/15/2018   Stage 3b chronic kidney disease (HCC) 06/16/2018   Screening for gout 06/15/2018   History of CVA (cerebrovascular accident) 06/01/2018   OSA (obstructive sleep apnea) 03/29/2015   Stroke (HCC)    Hypothyroidism 01/19/2015   Mixed hyperlipidemia 11/22/2014   Insulin  dependent diabetes mellitus    Intractable nausea and vomiting 07/27/2014   Vertigo 07/27/2014   Essential hypertension 07/27/2014   Type 1 diabetes mellitus (HCC) 05/29/2010   Hyperlipidemia 05/29/2010   Plantar fasciitis  05/29/2010    PCP: Berneta Elsie Sayre MD  REFERRING PROVIDER: same  REFERRING DIAG: gait instability  THERAPY DIAG:  Difficulty in walking, not elsewhere classified  Stiffness of left hip, not elsewhere classified  Abnormal posture  Rationale for Evaluation and Treatment: Rehabilitation  ONSET DATE: CVA in 2016, fractured hip and THA 2018.  Pt reports decline in function, also more intensifying lower back pain since that time.  SUBJECTIVE:   SUBJECTIVE STATEMENT:   Wants to walk without a limp, improve balance. L knees do hurt when he  puts pressure on it  I think when I had my stroke in 2016 I couldn't walk.  The therapy ran out and I haven't done anything since then. The balance is the biggest issue with me. I have a really hard time maneuvering steps , do ok with B handrails  PERTINENT HISTORY: CVA in 2016 which affected L side, h/o L hip replacement  PAIN:  Are you having pain? Yes: NPRS scale: /10 Pain location: lower back  Pain description: aching  Aggravating factors: activity Relieving factors: had steroid injection  PRECAUTIONS: None  RED FLAGS: None   WEIGHT BEARING RESTRICTIONS: No  FALLS:  Has patient fallen in last 6 months? 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  DIAGNOSTIC FINDINGS: MRI from 08/24/2023 which showed mild foraminal narrowing bilaterally at L5-S1 without clear neurologic impingement, and multilevel spondylosis with possible left L3 nerve root encroachment   PATIENT SURVEYS:  Lower Extremity Functional Score: 39 / 80 = 48.8 %  COGNITION: Overall cognitive status: Within functional limits for tasks assessed     SENSATION: Reports numbness R foot lat 3 toes, chronic    MUSCLE LENGTH: Hamstrings: Right nt deg; Left nt deg Thomas test: Right wnl deg; Left -10 deg  POSTURE: standing wide base of support, L foot with marked IR  PALPATION: Non tender to palpation L post /lat hip  LOWER EXTREMITY ROM:  Passive ROM Right eval Left eval  Hip flexion    Hip extension  -5  Hip abduction  20  Hip adduction    Hip internal rotation    Hip external  rotation  nt  Knee flexion    Knee extension    Ankle dorsiflexion    Ankle plantarflexion    Ankle inversion    Ankle eversion     (Blank rows = not tested)  LOWER EXTREMITY MMT:  MMT Right eval Left eval  Hip flexion 4 4  Hip extension lock bridge  wnl 3-  Hip abduction  4  Hip adduction    Hip internal rotation    Hip external rotation  4-  Knee flexion    Knee extension    Ankle dorsiflexion wnl wnl  Ankle plantarflexion toe walking  4 3-  Ankle inversion    Ankle eversion     (Blank rows = not tested)   FUNCTIONAL TESTS:  Timed up and go (TUG): 18.08sec FGA 18/30  GAIT: Distance walked: in clinic , up to 32' Assistive device utilized: None Level of assistance: Modified independence Comments: labored gait pattern,  marked L lateral trunk side bending with L stance, arms in mid guard, L LE IR throughout , wide base of support, decreased stance time on L, shortened step length B, more so on R                                                                                                                                TREATMENT DATE: 01/07/24 Bike L3.5 x6 min  Sit to stands 2x10   Cues for L weight shifts  4in step ups 2x5  Instability and weakness HS curls 35lb 2x10  LLE 20lb x10 Leg Ext 20lb 2x10 Shoulder Ext & Rows blue 2x10   12/22/23:  PT evaluation, instructed in 2 activities/ therex to perform at home until next session:  Forward step ups L with R knee drivers L foot on step for weight shift for/ back    PATIENT EDUCATION:  Education details: POC, goals Person educated: Patient Education method: Medical illustrator Education comprehension: verbalized understanding  HOME EXERCISE PROGRAM: TBD  ASSESSMENT:  CLINICAL IMPRESSION: Patient is a 58 y.o. male who was seen today by physical therapy due to unsteady gait pattern, precipitated by CVA with L sided deficits in 2016 a fall with L hip fx and replacement in 2018.  He has postural  changes, also quite weak L posterior hip musculature, balance deficits, and altered gait mechanics which contribute to a laborious gait. Initiated LE strengthening and and functional interventions. All interventions completed well. Tactiel cue for posture needed with rows and extensions.  LE really fatigued post session.  He should benefit from skilled PT to address his deficits.    OBJECTIVE IMPAIRMENTS: decreased activity tolerance, decreased balance, decreased coordination, decreased endurance, difficulty walking, decreased ROM, decreased strength, hypomobility, impaired perceived functional ability, impaired flexibility, impaired sensation, impaired vision/preception, improper body mechanics, postural dysfunction, and pain.   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   REHAB POTENTIAL: Good  CLINICAL DECISION MAKING: Evolving/moderate complexity  EVALUATION COMPLEXITY: Moderate   GOALS: Goals reviewed with patient? Yes  SHORT TERM GOALS: Target date: 2 weeks 01/05/24 I HEP Baseline: Goal status: INITIAL   LONG TERM GOALS: Target date: 12 weeks, 03/15/24  FGA improve from 18/30 to 24/30 or greater Baseline:  Goal status: INITIAL  2.  Improve L hip extension strength and L plantarflexion strength to 4/5 or greater for better stability, endurance, stair negotiation Baseline:  Goal status: INITIAL  3.  Tug score improve to 14 sec or less Baseline: 18.08 Goal status: INITIAL  4.  Lower Extremity Functional Score: 39 / 80 = 48.8 %improve to 50/80 or  greater  Baseline:  Goal status: INITIAL   PLAN:  PT FREQUENCY: 2x/week  PT DURATION: 12 weeks  PLANNED INTERVENTIONS: 97110-Therapeutic exercises, 97530- Therapeutic activity, 97112- Neuromuscular re-education, 97535- Self Care, [id number]- Manual therapy, [phone]- Gait training, Patient/Family education, Balance training, and Stair training  PLAN FOR NEXT SESSION: initiate cardiovascular training, focused strengthening for posterior chain musculature L Le, unilateral balance, light stretching L hip anterior and medial structures    Tanda KANDICE Sorrow, PTA, DPT, OCS 01/07/2024, 11:04 AM

## 2024-01-12 ENCOUNTER — Ambulatory Visit: Admitting: Physical Therapy

## 2024-01-12 ENCOUNTER — Encounter: Payer: Self-pay | Admitting: Physical Therapy

## 2024-01-12 DIAGNOSIS — R262 Difficulty in walking, not elsewhere classified: Secondary | ICD-10-CM

## 2024-01-12 DIAGNOSIS — R293 Abnormal posture: Secondary | ICD-10-CM

## 2024-01-12 DIAGNOSIS — M25652 Stiffness of left hip, not elsewhere classified: Secondary | ICD-10-CM

## 2024-01-12 NOTE — Therapy (Signed)
 OUTPATIENT PHYSICAL THERAPY LOWER EXTREMITY TREATMENT   Patient Name: Adrian Neal MRN: [id number] DOB:1965-10-03, 58 y.o., male Today's Date: 01/12/2024  END OF SESSION:  PT End of Session - 01/12/24 1141     Visit Number 3    Date for PT Re-Evaluation 03/15/24    PT Start Time 1142    PT Stop Time 1227    PT Time Calculation (min) 45 min    Activity Tolerance Patient tolerated treatment well    Behavior During Therapy WFL for tasks assessed/performed          Past Medical History:  Diagnosis Date   Cataract    Mixed form OS   Chronic kidney disease    Diabetes mellitus without complication (HCC)    diagnosed at age 41   Eye problems    Heart murmur 1996   High cholesterol    patient denies but take preventative medicine   Hypertension    Hypertensive retinopathy    OU   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   Sleep apnea    on BiPAP   Stroke (HCC) 2016   Past Surgical History:  Procedure Laterality Date   ANTERIOR APPROACH HEMI HIP ARTHROPLASTY Left 01/01/2019   Procedure: ANTERIOR APPROACH total hip ARTHROPLASTY;  Surgeon: Fidel Rogue, MD;  Location: [facility] OR;  Service: Orthopedics;  Laterality: Left;   CATARACT EXTRACTION Right 1994   CATARACT EXTRACTION Left 11/18/2023   CATARACT EXTRACTION W/ INTRAOCULAR LENS IMPLANT  1994   EYE SURGERY Right 1991   vitrectomy   EYE SURGERY Right 1992   scar tissue removed from retina   EYE SURGERY Right 1994   Cat Sx   Patient Active Problem List   Diagnosis Date Noted   Trapezius strain 09/02/2022   Pneumonia of left lower lobe due to infectious organism 10/09/2021   Achrochordon 08/15/2021   Midline low back pain without sciatica 04/12/2021   Ataxia 02/01/2021   Obstructive sleep apnea 07/13/2020   Anemia 01/04/2020   Medication side effect 01/04/2020   Osteopenia 10/04/2019   Patellofemoral pain syndrome of both knees 06/08/2019   Pain in both knees 06/03/2019   Excessive  daytime sleepiness 04/12/2019   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   Seasonal allergic rhinitis 03/11/2019   Need for pneumococcal vaccination 03/11/2019   Fracture of neck of femur (HCC) 01/13/2019   AKI (acute kidney injury) (HCC) 01/08/2019   Acute blood loss anemia 01/08/2019   History of femur fracture 01/08/2019   Need for influenza vaccination 01/08/2019   Hospital discharge follow-up 01/08/2019   Closed left femoral fracture (HCC) 12/31/2018   Type 1 diabetes mellitus with diabetic neuropathy, unspecified (HCC) 12/17/2018   Type 1 diabetes mellitus with stage 3 chronic kidney disease (HCC) 08/12/2018   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) 08/12/2018   Type 1 diabetes mellitus with hyperglycemia (HCC) 08/12/2018   Vitamin D  deficiency 07/15/2018   Stage 3b chronic kidney disease (HCC) 06/16/2018   Screening for gout 06/15/2018   History of CVA (cerebrovascular accident) 06/01/2018   OSA (obstructive sleep apnea) 03/29/2015   Stroke (HCC)    Hypothyroidism 01/19/2015   Mixed hyperlipidemia 11/22/2014   Insulin  dependent diabetes mellitus    Intractable nausea and vomiting 07/27/2014   Vertigo 07/27/2014   Essential hypertension 07/27/2014   Type 1 diabetes mellitus (HCC) 05/29/2010   Hyperlipidemia 05/29/2010   Plantar fasciitis  05/29/2010    PCP: Berneta Elsie Sayre MD  REFERRING PROVIDER: same  REFERRING DIAG: gait instability  THERAPY DIAG:  Difficulty in walking, not elsewhere classified  Stiffness of left hip, not elsewhere classified  Abnormal posture  Rationale for Evaluation and Treatment: Rehabilitation  ONSET DATE: CVA in 2016, fractured hip and THA 2018.  Pt reports decline in function, also more intensifying lower back pain since that time.  SUBJECTIVE:   SUBJECTIVE STATEMENT:  OK, had a little stomach ache, due to coffee  I think when I had my  stroke in 2016 I couldn't walk.  The therapy ran out and I haven't done anything since then. The balance is the biggest issue with me. I have a really hard time maneuvering steps , do ok with B handrails  PERTINENT HISTORY: CVA in 2016 which affected L side, h/o L hip replacement  PAIN:  Are you having pain? Yes: NPRS scale: 1/10 Pain location: L knee Pain description: aching  Aggravating factors: activity Relieving factors: had steroid injection  PRECAUTIONS: None  RED FLAGS: None   WEIGHT BEARING RESTRICTIONS: No  FALLS:  Has patient fallen in last 6 months? 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  DIAGNOSTIC FINDINGS: MRI from 08/24/2023 which showed mild foraminal narrowing bilaterally at L5-S1 without clear neurologic impingement, and multilevel spondylosis with possible left L3 nerve root encroachment   PATIENT SURVEYS:  Lower Extremity Functional Score: 39 / 80 = 48.8 %  COGNITION: Overall cognitive status: Within functional limits for tasks assessed     SENSATION: Reports numbness R foot lat 3 toes, chronic    MUSCLE LENGTH: Hamstrings: Right nt deg; Left nt deg Thomas test: Right wnl deg; Left -10 deg  POSTURE: standing wide base of support, L foot with marked IR  PALPATION: Non tender to palpation L post /lat hip  LOWER EXTREMITY ROM:  Passive ROM Right eval Left eval  Hip flexion    Hip extension  -5  Hip abduction  20  Hip adduction    Hip internal rotation    Hip external rotation  nt  Knee flexion    Knee extension     Ankle dorsiflexion    Ankle plantarflexion    Ankle inversion    Ankle eversion     (Blank rows = not tested)  LOWER EXTREMITY MMT:  MMT Right eval Left eval  Hip flexion 4 4  Hip extension lock bridge  wnl 3-  Hip abduction  4  Hip adduction    Hip internal rotation    Hip external rotation  4-  Knee flexion    Knee extension    Ankle dorsiflexion wnl wnl  Ankle plantarflexion toe walking  4 3-  Ankle inversion    Ankle eversion     (Blank rows = not tested)   FUNCTIONAL TESTS:  Timed up and go (TUG): 18.08sec FGA 18/30  GAIT: Distance walked: in clinic , up to 72' Assistive device utilized: None Level of assistance: Modified independence Comments: labored gait pattern, marked L lateral trunk side bending with L stance, arms in  mid guard, L LE IR throughout , wide base of support, decreased stance time on L, shortened step length B, more so on R                                                                                                                                TREATMENT DATE: 01/12/24 NuStep L 5 x 6 min 20lb resisted gait 4 way x 3 each 6in step ups x5 each Sit to stands 2x10   Cues for L weight shifts  Leg press 40lb 2x10, SL 2x5 each Ext and rows black 2x10   01/07/24 Bike L3.5 x6 min  Sit to stands 2x10   Cues for L weight shifts  4in step ups 2x5  Instability and weakness HS curls 35lb 2x10  LLE 20lb x10 Leg Ext 20lb 2x10 Shoulder Ext & Rows blue 2x10   12/22/23:  PT evaluation, instructed in 2 activities/ therex to perform at home until next session:  Forward step ups L with R knee drivers L foot on step for weight shift for/ back    PATIENT EDUCATION:  Education details: POC, goals Person educated: Patient Education method: Medical illustrator Education comprehension: verbalized understanding  HOME EXERCISE PROGRAM: Sit to stands   ASSESSMENT:  CLINICAL IMPRESSION: Patient is a 58 y.o. male who was seen today by  physical therapy due to unsteady gait pattern, precipitated by CVA with L sided deficits in 2016 a fall with L hip fx and replacement in 2018.  He has postural changes, also quite weak L posterior hip musculature, balance deficits, and altered gait mechanics which contribute to a laborious gait. Continued  LE strengthening and  functional interventions. Some difficulty with the 6 in step ups due to weakness and instability. Cue for equal step length with resisted gait. All interventions completed well. Tactiel cue for posture needed with rows and extensions. Again  LE really fatigued post session.  He should benefit from skilled PT to address his deficits.    OBJECTIVE IMPAIRMENTS: decreased activity tolerance, decreased balance, decreased coordination, decreased endurance, difficulty walking, decreased ROM, decreased strength, hypomobility, impaired perceived functional ability, impaired flexibility, impaired sensation, impaired vision/preception, improper body mechanics, postural dysfunction, and pain.   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   REHAB POTENTIAL: Good  CLINICAL DECISION MAKING: Evolving/moderate complexity  EVALUATION COMPLEXITY: Moderate   GOALS: Goals reviewed with patient? Yes  SHORT TERM GOALS: Target date: 2 weeks 01/05/24 I HEP Baseline: Goal status: ongoing 01/12/24   LONG TERM GOALS: Target date: 12 weeks, 03/15/24  FGA improve from 18/30 to 24/30 or greater Baseline:  Goal status: INITIAL  2.  Improve L hip extension strength and  L plantarflexion strength to 4/5 or greater for better stability, endurance, stair negotiation Baseline:  Goal status: INITIAL  3.  Tug score improve to 14 sec or less Baseline: 18.08 Goal status: INITIAL  4.  Lower Extremity Functional Score: 39 / 80 = 48.8 %improve to 50/80 or greater  Baseline:  Goal status: INITIAL   PLAN:  PT FREQUENCY: 2x/week  PT DURATION: 12 weeks  PLANNED INTERVENTIONS: 97110-Therapeutic exercises, 97530- Therapeutic activity, 97112- Neuromuscular re-education, 97535- Self Care, [id number]- Manual therapy, [phone]- Gait training, Patient/Family education, Balance training, and Stair training  PLAN FOR NEXT SESSION: initiate cardiovascular training, focused strengthening for posterior chain musculature L Le, unilateral balance, light stretching L hip anterior and medial structures    Tanda KANDICE Sorrow, PTA, DPT, OCS 01/12/2024, 11:42 AM

## 2024-01-14 ENCOUNTER — Ambulatory Visit

## 2024-01-14 ENCOUNTER — Other Ambulatory Visit: Payer: Self-pay

## 2024-01-14 DIAGNOSIS — R262 Difficulty in walking, not elsewhere classified: Secondary | ICD-10-CM

## 2024-01-14 DIAGNOSIS — R293 Abnormal posture: Secondary | ICD-10-CM

## 2024-01-14 DIAGNOSIS — E109 Type 1 diabetes mellitus without complications: Secondary | ICD-10-CM | POA: Diagnosis not present

## 2024-01-14 DIAGNOSIS — M25652 Stiffness of left hip, not elsewhere classified: Secondary | ICD-10-CM

## 2024-01-14 NOTE — Therapy (Signed)
 OUTPATIENT PHYSICAL THERAPY LOWER EXTREMITY TREATMENT   Patient Name: Adrian Neal MRN: [id number] DOB:16-Mar-1966, 58 y.o., male Today's Date: 01/14/2024  END OF SESSION:  PT End of Session - 01/14/24 1128     Visit Number 4    Date for PT Re-Evaluation 03/15/24    Progress Note Due on Visit 10    PT Start Time 1100    PT Stop Time 1143    PT Time Calculation (min) 43 min    Activity Tolerance Patient tolerated treatment well    Behavior During Therapy WFL for tasks assessed/performed           Past Medical History:  Diagnosis Date   Cataract    Mixed form OS   Chronic kidney disease    Diabetes mellitus without complication (HCC)    diagnosed at age 38   Eye problems    Heart murmur 1996   High cholesterol    patient denies but take preventative medicine   Hypertension    Hypertensive retinopathy    OU   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   Sleep apnea    on BiPAP   Stroke (HCC) 2016   Past Surgical History:  Procedure Laterality Date   ANTERIOR APPROACH HEMI HIP ARTHROPLASTY Left 01/01/2019   Procedure: ANTERIOR APPROACH total hip ARTHROPLASTY;  Surgeon: Fidel Rogue, MD;  Location: [facility] OR;  Service: Orthopedics;  Laterality: Left;   CATARACT EXTRACTION Right 1994   CATARACT EXTRACTION Left 11/18/2023   CATARACT EXTRACTION W/ INTRAOCULAR LENS IMPLANT  1994   EYE SURGERY Right 1991   vitrectomy   EYE SURGERY Right 1992   scar tissue removed from retina   EYE SURGERY Right 1994   Cat Sx   Patient Active Problem List   Diagnosis Date Noted   Trapezius strain 09/02/2022   Pneumonia of left lower lobe due to infectious organism 10/09/2021   Achrochordon 08/15/2021   Midline low back pain without sciatica 04/12/2021   Ataxia 02/01/2021   Obstructive sleep apnea 07/13/2020   Anemia 01/04/2020   Medication side effect 01/04/2020   Osteopenia 10/04/2019   Patellofemoral pain syndrome of both knees 06/08/2019   Pain in  both knees 06/03/2019   Excessive daytime sleepiness 04/12/2019   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   Seasonal allergic rhinitis 03/11/2019   Need for pneumococcal vaccination 03/11/2019   Fracture of neck of femur (HCC) 01/13/2019   AKI (acute kidney injury) (HCC) 01/08/2019   Acute blood loss anemia 01/08/2019   History of femur fracture 01/08/2019   Need for influenza vaccination 01/08/2019   Hospital discharge follow-up 01/08/2019   Closed left femoral fracture (HCC) 12/31/2018   Type 1 diabetes mellitus with diabetic neuropathy, unspecified (HCC) 12/17/2018   Type 1 diabetes mellitus with stage 3 chronic kidney disease (HCC) 08/12/2018   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) 08/12/2018   Type 1 diabetes mellitus with hyperglycemia (HCC) 08/12/2018   Vitamin D  deficiency 07/15/2018   Stage 3b chronic kidney disease (HCC) 06/16/2018   Screening for gout 06/15/2018   History of CVA (cerebrovascular accident) 06/01/2018   OSA (obstructive sleep apnea) 03/29/2015   Stroke (HCC)    Hypothyroidism 01/19/2015   Mixed hyperlipidemia 11/22/2014   Insulin  dependent diabetes mellitus    Intractable nausea and vomiting 07/27/2014   Vertigo 07/27/2014   Essential hypertension 07/27/2014   Type 1 diabetes mellitus (  HCC) 05/29/2010   Hyperlipidemia 05/29/2010   Plantar fasciitis 05/29/2010    PCP: Berneta Elsie Sayre MD  REFERRING PROVIDER: same  REFERRING DIAG: gait instability  THERAPY DIAG:  Difficulty in walking, not elsewhere classified  Stiffness of left hip, not elsewhere classified  Abnormal posture  Rationale for Evaluation and Treatment: Rehabilitation  ONSET DATE: CVA in 2016, fractured hip and THA 2018.  Pt reports decline in function, also more intensifying lower back pain since that time.  SUBJECTIVE:   SUBJECTIVE STATEMENT:  OK, I could tell I was tired after  yesterday's session with Tanda  I think when I had my stroke in 2016 I couldn't walk.  The therapy ran out and I haven't done anything since then. The balance is the biggest issue with me. I have a really hard time maneuvering steps , do ok with B handrails  PERTINENT HISTORY: CVA in 2016 which affected L side, h/o L hip replacement  PAIN:  Are you having pain? Yes: NPRS scale: 1/10 Pain location: L knee Pain description: aching  Aggravating factors: activity Relieving factors: had steroid injection  PRECAUTIONS: None  RED FLAGS: None   WEIGHT BEARING RESTRICTIONS: No  FALLS:  Has patient fallen in last 6 months? 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  DIAGNOSTIC FINDINGS: MRI from 08/24/2023 which showed mild foraminal narrowing bilaterally at L5-S1 without clear neurologic impingement, and multilevel spondylosis with possible left L3 nerve root encroachment   PATIENT SURVEYS:  Lower Extremity Functional Score: 39 / 80 = 48.8 %  COGNITION: Overall cognitive status: Within functional limits for tasks assessed     SENSATION: Reports numbness R foot lat 3 toes, chronic    MUSCLE LENGTH: Hamstrings: Right nt deg; Left nt deg Thomas test: Right wnl deg; Left -10 deg  POSTURE: standing wide base of support, L foot with marked IR  PALPATION: Non tender to palpation L post /lat hip  LOWER EXTREMITY ROM:  Passive ROM Right eval Left eval  Hip flexion    Hip extension  -5  Hip abduction  20  Hip adduction    Hip internal rotation    Hip  external rotation  nt  Knee flexion    Knee extension    Ankle dorsiflexion    Ankle plantarflexion    Ankle inversion    Ankle eversion     (Blank rows = not tested)  LOWER EXTREMITY MMT:  MMT Right eval Left eval  Hip flexion 4 4  Hip extension lock bridge  wnl 3-  Hip abduction  4  Hip adduction    Hip internal rotation    Hip external rotation  4-  Knee flexion    Knee extension    Ankle dorsiflexion wnl wnl  Ankle plantarflexion toe walking  4 3-  Ankle inversion    Ankle eversion     (Blank rows = not tested)   FUNCTIONAL TESTS:  Timed up and go (TUG): 18.08sec FGA 18/30  GAIT: Distance walked: in clinic , up to 41' Assistive device utilized: None Level of assistance: Modified independence Comments: labored  gait pattern, marked L lateral trunk side bending with L stance, arms in mid guard, L LE IR throughout , wide base of support, decreased stance time on L, shortened step length B, more so on R                                                                                                                                TREATMENT DATE: 01/14/24:  Nustep: L 5 x 6 min In ll bars for forward lunges with each leg on compliant side of BOSU mass practice Modified RDL's 5# kettlebell, in ll bars, one hand on bar, to 20 platform to fatigue each leg  Side stepping length of ll bars with 2# cuff wts 5 x Standing feet shoulder width with forward punches with 2# medibar, also forward punches with reaches to side , all 10 reps Leg press B 40# 2 x 10 Single leg 20# 2 x 5 each Heel lifts B with forefeet on 3' bar 10 x 3  01/12/24 NuStep L 5 x 6 min 20lb resisted gait 4 way x 3 each 6in step ups x5 each Sit to stands 2x10   Cues for L weight shifts  Leg press 40lb 2x10, SL 2x5 each Ext and rows black 2x10   01/07/24 Bike L3.5 x6 min  Sit to stands 2x10   Cues for L weight shifts  4in step ups 2x5  Instability and weakness HS curls 35lb 2x10  LLE 20lb x10 Leg  Ext 20lb 2x10 Shoulder Ext & Rows blue 2x10   12/22/23:  PT evaluation, instructed in 2 activities/ therex to perform at home until next session:  Forward step ups L with R knee drivers L foot on step for weight shift for/ back    PATIENT EDUCATION:  Education details: POC, goals Person educated: Patient Education method: Medical illustrator Education comprehension: verbalized understanding  HOME EXERCISE PROGRAM: Sit to stands   ASSESSMENT:  CLINICAL IMPRESSION: Patient is a 58 y.o. male who was seen today by physical therapy due to unsteady gait pattern, precipitated by CVA with L sided deficits in 2016 a fall with L hip fx and replacement in 2018.  He has postural changes, also quite weak L posterior hip musculature, balance deficits, and altered gait mechanics which contribute to a laborious gait. Continued  LE strengthening and  functional interventions. He tolerated well, had difficulty with the single leg press on L.  He should benefit from skilled PT to address his deficits.    OBJECTIVE IMPAIRMENTS: decreased activity tolerance, decreased balance, decreased coordination, decreased endurance, difficulty walking, decreased ROM, decreased strength, hypomobility, impaired perceived functional ability, impaired flexibility, impaired sensation, impaired vision/preception, improper body mechanics, postural dysfunction, and pain.   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   REHAB POTENTIAL: Good  CLINICAL DECISION MAKING: Evolving/moderate  complexity  EVALUATION COMPLEXITY: Moderate   GOALS: Goals reviewed with patient? Yes  SHORT TERM GOALS: Target date: 2 weeks 01/05/24 I HEP Baseline: Goal status: ongoing 01/12/24   LONG TERM GOALS: Target date: 12 weeks, 03/15/24  FGA improve from 18/30 to 24/30 or greater Baseline:  Goal status: INITIAL  2.  Improve L hip extension strength and L plantarflexion strength to 4/5 or greater for better stability, endurance, stair negotiation Baseline:  Goal status: INITIAL  3.  Tug score improve to 14 sec or less Baseline: 18.08 Goal status: INITIAL  4.  Lower Extremity Functional Score: 39 / 80 = 48.8 %improve to 50/80 or greater  Baseline:  Goal status: INITIAL   PLAN:  PT FREQUENCY: 2x/week  PT DURATION: 12 weeks  PLANNED INTERVENTIONS: 97110-Therapeutic exercises, 97530- Therapeutic activity, 97112- Neuromuscular re-education, 97535- Self Care, [id number]- Manual therapy, [phone]- Gait training, Patient/Family education, Balance training, and Stair training  PLAN FOR NEXT SESSION:  cardiovascular training, focused strengthening for posterior chain musculature L Le, unilateral balance, light stretching L hip anterior and medial structures    [place] Huberty L Shefali Ng, PT, DPT, OCS 01/14/2024, 11:47 AM

## 2024-01-19 ENCOUNTER — Other Ambulatory Visit: Payer: Self-pay

## 2024-01-19 ENCOUNTER — Ambulatory Visit

## 2024-01-19 DIAGNOSIS — M25652 Stiffness of left hip, not elsewhere classified: Secondary | ICD-10-CM

## 2024-01-19 DIAGNOSIS — R262 Difficulty in walking, not elsewhere classified: Secondary | ICD-10-CM | POA: Diagnosis not present

## 2024-01-19 DIAGNOSIS — R293 Abnormal posture: Secondary | ICD-10-CM

## 2024-01-19 NOTE — Therapy (Signed)
 OUTPATIENT PHYSICAL THERAPY LOWER EXTREMITY TREATMENT   Patient Name: Adrian Neal MRN: [id number] DOB:[birth year], 58 y.o., male Today's Date: 01/19/2024  END OF SESSION:  PT End of Session - 01/19/24 1107     Visit Number 5    Date for Recertification  03/15/24    Progress Note Due on Visit 10    PT Start Time 1102    PT Stop Time 1145    PT Time Calculation (min) 43 min    Activity Tolerance Patient tolerated treatment well    Behavior During Therapy WFL for tasks assessed/performed            Past Medical History:  Diagnosis Date   Cataract    Mixed form OS   Chronic kidney disease    Diabetes mellitus without complication (HCC)    diagnosed at age 31   Eye problems    Heart murmur 1996   High cholesterol    patient denies but take preventative medicine   Hypertension    Hypertensive retinopathy    OU   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   Sleep apnea    on BiPAP   Stroke (HCC) 2016   Past Surgical History:  Procedure Laterality Date   ANTERIOR APPROACH HEMI HIP ARTHROPLASTY Left 01/01/2019   Procedure: ANTERIOR APPROACH total hip ARTHROPLASTY;  Surgeon: Fidel Rogue, MD;  Location: [facility] OR;  Service: Orthopedics;  Laterality: Left;   CATARACT EXTRACTION Right 1994   CATARACT EXTRACTION Left 11/18/2023   CATARACT EXTRACTION W/ INTRAOCULAR LENS IMPLANT  1994   EYE SURGERY Right 1991   vitrectomy   EYE SURGERY Right 1992   scar tissue removed from retina   EYE SURGERY Right 1994   Cat Sx   Patient Active Problem List   Diagnosis Date Noted   Trapezius strain 09/02/2022   Pneumonia of left lower lobe due to infectious organism 10/09/2021   Achrochordon 08/15/2021   Midline low back pain without sciatica 04/12/2021   Ataxia 02/01/2021   Obstructive sleep apnea 07/13/2020   Anemia 01/04/2020   Medication side effect 01/04/2020   Osteopenia 10/04/2019   Patellofemoral pain syndrome of both knees 06/08/2019   Pain in  both knees 06/03/2019   Excessive daytime sleepiness 04/12/2019   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   Seasonal allergic rhinitis 03/11/2019   Need for pneumococcal vaccination 03/11/2019   Fracture of neck of femur (HCC) 01/13/2019   AKI (acute kidney injury) (HCC) 01/08/2019   Acute blood loss anemia 01/08/2019   History of femur fracture 01/08/2019   Need for influenza vaccination 01/08/2019   Hospital discharge follow-up 01/08/2019   Closed left femoral fracture (HCC) 12/31/2018   Type 1 diabetes mellitus with diabetic neuropathy, unspecified (HCC) 12/17/2018   Type 1 diabetes mellitus with stage 3 chronic kidney disease (HCC) 08/12/2018   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) 08/12/2018   Type 1 diabetes mellitus with hyperglycemia (HCC) 08/12/2018   Vitamin D  deficiency 07/15/2018   Stage 3b chronic kidney disease (HCC) 06/16/2018   Screening for gout 06/15/2018   History of CVA (cerebrovascular accident) 06/01/2018   OSA (obstructive sleep apnea) 03/29/2015   Stroke (HCC)    Hypothyroidism 01/19/2015   Mixed hyperlipidemia 11/22/2014   Insulin  dependent diabetes mellitus    Intractable nausea and vomiting 07/27/2014   Vertigo 07/27/2014   Essential hypertension 07/27/2014   Type 1 diabetes  mellitus (HCC) 05/29/2010   Hyperlipidemia 05/29/2010   Plantar fasciitis 05/29/2010    PCP: Berneta Elsie Sayre MD  REFERRING PROVIDER: same  REFERRING DIAG: gait instability  THERAPY DIAG:  Difficulty in walking, not elsewhere classified  Stiffness of left hip, not elsewhere classified  Abnormal posture  Rationale for Evaluation and Treatment: Rehabilitation  ONSET DATE: CVA in 2016, fractured hip and THA 2018.  Pt reports decline in function, also more intensifying lower back pain since that time.  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  No significant soreness after last PT session.  I think when I had my stroke in 2016 I couldn't walk.  The therapy ran out and I haven't done anything since then. The balance is the biggest issue with me. I have a really hard time maneuvering steps , do ok with B handrails  PERTINENT HISTORY: CVA in 2016 which affected L side, h/o L hip replacement  PAIN:  Are you having pain? Yes: NPRS scale: 1/10 Pain location: L knee Pain description: aching  Aggravating factors: activity Relieving factors: had steroid injection  PRECAUTIONS: None  RED FLAGS: None   WEIGHT BEARING RESTRICTIONS: No  FALLS:  Has patient fallen in last 6 months? 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  DIAGNOSTIC FINDINGS: MRI from 08/24/2023 which showed mild foraminal narrowing bilaterally at L5-S1 without clear neurologic impingement, and multilevel spondylosis with possible left L3 nerve root encroachment   PATIENT SURVEYS:  Lower Extremity Functional Score: 39 / 80 = 48.8 %  COGNITION: Overall cognitive status: Within functional limits for tasks assessed     SENSATION: Reports numbness R foot lat 3 toes, chronic    MUSCLE LENGTH: Hamstrings: Right nt deg; Left nt deg Thomas test: Right wnl deg; Left -10 deg  POSTURE: standing wide base of support, L foot with marked IR  PALPATION: Non tender to palpation L post /lat hip  LOWER EXTREMITY ROM:  Passive ROM Right eval Left eval  Hip flexion    Hip extension  -5  Hip abduction  20  Hip  adduction    Hip internal rotation    Hip external rotation  nt  Knee flexion    Knee extension    Ankle dorsiflexion    Ankle plantarflexion    Ankle inversion    Ankle eversion     (Blank rows = not tested)  LOWER EXTREMITY MMT:  MMT Right eval Left eval  Hip flexion 4 4  Hip extension lock bridge  wnl 3-  Hip abduction  4  Hip adduction    Hip internal rotation    Hip external rotation  4-  Knee flexion    Knee extension    Ankle dorsiflexion wnl wnl  Ankle plantarflexion toe walking  4 3-  Ankle inversion    Ankle eversion     (Blank rows = not tested)   FUNCTIONAL TESTS:  Timed up and go (TUG): 18.08sec FGA 18/30  GAIT: Distance walked: in clinic , up to 38' Assistive device utilized: None Level  of assistance: Modified independence Comments: labored gait pattern, marked L lateral trunk side bending with L stance, arms in mid guard, L LE IR throughout , wide base of support, decreased stance time on L, shortened step length B, more so on R                                                                                                                                TREATMENT DATE: 01/19/24:  Recumbent cycle L 3 x 6 min Leg press B 40# 3 x 10 Single leg press, R  40# 2 x 10, then 20# one set of 10  In ll bars for modified RDL's with 5# kettle bell in one hand.  Side stepping with 3# cuff weights each ankle, and standing alt hip abd with 3# wt, 15 reps each Standing on round airex pad, alt taps to 1/2round sea urchins, with 3# cuff wts each leg  Semi tandem standing lead foot on round airex, with forward punches with 2# therabar, also side to side rotations with therabar 10 each Heel lifts B with forefeet on 3' bar 10 x 3  01/14/24:  Nustep: L 5 x 6 min In ll bars for forward lunges with each leg on compliant side of BOSU mass practice Modified RDL's 5# kettlebell, in ll bars, one hand on bar, to 20 platform to fatigue each leg  Side stepping length of ll  bars with 2# cuff wts 5 x Standing feet shoulder width with forward punches with 2# medibar, also forward punches with reaches to side , all 10 reps Leg press B 40# 2 x 10 Single leg 20# 2 x 5 each Heel lifts B with forefeet on 3' bar 10 x 3  01/12/24 NuStep L 5 x 6 min 20lb resisted gait 4 way x 3 each 6in step ups x5 each Sit to stands 2x10   Cues for L weight shifts  Leg press 40lb 2x10, SL 2x5 each Ext and rows black 2x10   01/07/24 Bike L3.5 x6 min  Sit to stands 2x10   Cues for L weight shifts  4in step ups 2x5  Instability and weakness HS curls 35lb 2x10  LLE 20lb x10 Leg Ext 20lb 2x10 Shoulder Ext & Rows blue 2x10   12/22/23:  PT evaluation, instructed in 2 activities/ therex to perform at home until next session:  Forward step ups L with R knee drivers L foot on step for weight shift for/ back    PATIENT EDUCATION:  Education details: POC, goals Person educated: Patient Education method: Medical illustrator Education comprehension: verbalized understanding  HOME EXERCISE PROGRAM: Sit to stands   ASSESSMENT:  CLINICAL IMPRESSION: Patient is a 58 y.o. male who participated in his 5th physical therapy session today due to unsteady gait pattern, precipitated by CVA with L sided deficits in 2016 a fall with L hip fx and replacement in 2018.  He has postural changes, also quite  weak L posterior hip musculature, balance deficits, and altered gait mechanics which contribute to a laborious gait. Continued  LE strengthening and  functional interventions, today worked a lot with narrowed stance and modified tandem stance activities. He tolerated well, had difficulty with the single leg press on L.  He should benefit from skilled PT to address his deficits.    OBJECTIVE IMPAIRMENTS: decreased activity tolerance, decreased balance, decreased coordination, decreased endurance, difficulty walking, decreased ROM, decreased strength, hypomobility, impaired perceived  functional ability, impaired flexibility, impaired sensation, impaired vision/preception, improper body mechanics, postural dysfunction, and pain.   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   REHAB POTENTIAL: Good  CLINICAL DECISION MAKING: Evolving/moderate complexity  EVALUATION COMPLEXITY: Moderate   GOALS: Goals reviewed with patient? Yes  SHORT TERM GOALS: Target date: 2 weeks 01/05/24 I HEP Baseline: Goal status: ongoing 01/12/24   LONG TERM GOALS: Target date: 12 weeks, 03/15/24  FGA improve from 18/30 to 24/30 or greater Baseline:  Goal status: INITIAL  2.  Improve L hip extension strength and L plantarflexion strength to 4/5 or greater for better stability, endurance, stair negotiation Baseline:  Goal status: INITIAL  3.  Tug score improve to 14 sec or less Baseline: 18.08 Goal status: INITIAL  4.  Lower Extremity Functional Score: 39 / 80 = 48.8 %improve to 50/80 or greater  Baseline:  Goal status: INITIAL   PLAN:  PT FREQUENCY: 2x/week  PT DURATION: 12 weeks  PLANNED INTERVENTIONS: 97110-Therapeutic exercises, 97530- Therapeutic activity, 97112- Neuromuscular re-education, 97535- Self Care, [id number]- Manual therapy, [phone]- Gait training, Patient/Family education, Balance training, and Stair training  PLAN FOR NEXT SESSION:  cardiovascular training, focused strengthening for posterior chain musculature L Le, unilateral balance, light stretching L hip anterior and medial structures    Shanyia Stines L Kylena Mole, PT, DPT, OCS 01/19/2024, 1:01  PM

## 2024-01-21 ENCOUNTER — Ambulatory Visit: Admitting: Physical Therapy

## 2024-01-21 ENCOUNTER — Encounter: Payer: Self-pay | Admitting: Physical Therapy

## 2024-01-21 DIAGNOSIS — R262 Difficulty in walking, not elsewhere classified: Secondary | ICD-10-CM

## 2024-01-21 DIAGNOSIS — R293 Abnormal posture: Secondary | ICD-10-CM

## 2024-01-21 DIAGNOSIS — M25652 Stiffness of left hip, not elsewhere classified: Secondary | ICD-10-CM

## 2024-01-21 NOTE — Therapy (Signed)
 OUTPATIENT PHYSICAL THERAPY LOWER EXTREMITY TREATMENT   Patient Name: Adrian Neal MRN: [id number] DOB:[birth year], 58 y.o., male Today's Date: 01/21/2024  END OF SESSION:  PT End of Session - 01/21/24 1100     Visit Number 6    Date for Recertification  03/15/24    PT Start Time 1100    PT Stop Time 1145    PT Time Calculation (min) 45 min    Activity Tolerance Patient tolerated treatment well    Behavior During Therapy WFL for tasks assessed/performed            Past Medical History:  Diagnosis Date   Cataract    Mixed form OS   Chronic kidney disease    Diabetes mellitus without complication (HCC)    diagnosed at age 55   Eye problems    Heart murmur 1996   High cholesterol    patient denies but take preventative medicine   Hypertension    Hypertensive retinopathy    OU   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   Sleep apnea    on BiPAP   Stroke (HCC) 2016   Past Surgical History:  Procedure Laterality Date   ANTERIOR APPROACH HEMI HIP ARTHROPLASTY Left 01/01/2019   Procedure: ANTERIOR APPROACH total hip ARTHROPLASTY;  Surgeon: Fidel Rogue, MD;  Location: [facility] OR;  Service: Orthopedics;  Laterality: Left;   CATARACT EXTRACTION Right 1994   CATARACT EXTRACTION Left 11/18/2023   CATARACT EXTRACTION W/ INTRAOCULAR LENS IMPLANT  1994   EYE SURGERY Right 1991   vitrectomy   EYE SURGERY Right 1992   scar tissue removed from retina   EYE SURGERY Right 1994   Cat Sx   Patient Active Problem List   Diagnosis Date Noted   Trapezius strain 09/02/2022   Pneumonia of left lower lobe due to infectious organism 10/09/2021   Achrochordon 08/15/2021   Midline low back pain without sciatica 04/12/2021   Ataxia 02/01/2021   Obstructive sleep apnea 07/13/2020   Anemia 01/04/2020   Medication side effect 01/04/2020   Osteopenia 10/04/2019   Patellofemoral pain syndrome of both knees 06/08/2019   Pain in both knees 06/03/2019   Excessive  daytime sleepiness 04/12/2019   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   Seasonal allergic rhinitis 03/11/2019   Need for pneumococcal vaccination 03/11/2019   Fracture of neck of femur (HCC) 01/13/2019   AKI (acute kidney injury) 01/08/2019   Acute blood loss anemia 01/08/2019   History of femur fracture 01/08/2019   Need for influenza vaccination 01/08/2019   Hospital discharge follow-up 01/08/2019   Closed left femoral fracture (HCC) 12/31/2018   Type 1 diabetes mellitus with diabetic neuropathy, unspecified (HCC) 12/17/2018   Type 1 diabetes mellitus with stage 3 chronic kidney disease (HCC) 08/12/2018   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) 08/12/2018   Type 1 diabetes mellitus with hyperglycemia (HCC) 08/12/2018   Vitamin D  deficiency 07/15/2018   Stage 3b chronic kidney disease (HCC) 06/16/2018   Screening for gout 06/15/2018   History of CVA (cerebrovascular accident) 06/01/2018   OSA (obstructive sleep apnea) 03/29/2015   Stroke (HCC)    Hypothyroidism 01/19/2015   Mixed hyperlipidemia 11/22/2014   Insulin  dependent diabetes mellitus    Intractable nausea and vomiting 07/27/2014   Vertigo 07/27/2014   Essential hypertension 07/27/2014   Type 1 diabetes mellitus (HCC) 05/29/2010   Hyperlipidemia 05/29/2010   Plantar  fasciitis 05/29/2010    PCP: Berneta Elsie Sayre MD  REFERRING PROVIDER: same  REFERRING DIAG: gait instability  THERAPY DIAG:  Difficulty in walking, not elsewhere classified  Stiffness of left hip, not elsewhere classified  Abnormal posture  Rationale for Evaluation and Treatment: Rehabilitation  ONSET DATE: CVA in 2016, fractured hip and THA 2018.  Pt reports decline in function, also more intensifying lower back pain since that time.  SUBJECTIVE:   SUBJECTIVE STATEMENT: Doing all right, still wore out from Monday  I think when I had my stroke in  2016 I couldn't walk.  The therapy ran out and I haven't done anything since then. The balance is the biggest issue with me. I have a really hard time maneuvering steps , do ok with B handrails  PERTINENT HISTORY: CVA in 2016 which affected L side, h/o L hip replacement  PAIN:  Are you having pain? Yes: NPRS scale: 0/10 Pain location: L knee Pain description: aching  Aggravating factors: activity Relieving factors: had steroid injection  PRECAUTIONS: None  RED FLAGS: None   WEIGHT BEARING RESTRICTIONS: No  FALLS:  Has patient fallen in last 6 months? 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  DIAGNOSTIC FINDINGS: MRI from 08/24/2023 which showed mild foraminal narrowing bilaterally at L5-S1 without clear neurologic impingement, and multilevel spondylosis with possible left L3 nerve root encroachment   PATIENT SURVEYS:  Lower Extremity Functional Score: 39 / 80 = 48.8 %  COGNITION: Overall cognitive status: Within functional limits for tasks assessed     SENSATION: Reports numbness R foot lat 3 toes, chronic    MUSCLE LENGTH: Hamstrings: Right nt deg; Left nt deg Thomas test: Right wnl deg; Left -10 deg  POSTURE: standing wide base of support, L foot with marked IR  PALPATION: Non tender to palpation L post /lat hip  LOWER EXTREMITY ROM:  Passive ROM Right eval Left eval  Hip flexion    Hip extension  -5  Hip abduction  20  Hip adduction    Hip internal rotation    Hip external rotation  nt  Knee flexion    Knee extension    Ankle  dorsiflexion    Ankle plantarflexion    Ankle inversion    Ankle eversion     (Blank rows = not tested)  LOWER EXTREMITY MMT:  MMT Right eval Left eval Left 01/21/24  Hip flexion 4 4 4+  Hip extension lock bridge  wnl 3-   Hip abduction  4 5  Hip adduction     Hip internal rotation     Hip external rotation  4-   Knee flexion   5  Knee extension   5  Ankle dorsiflexion wnl wnl 5  Ankle plantarflexion toe walking  4 3-   Ankle inversion     Ankle eversion      (Blank rows = not tested)   FUNCTIONAL TESTS:  Timed up and go (TUG): 18.08sec FGA 18/30  GAIT: Distance walked: in clinic , up to 62' Assistive device utilized: None Level of assistance: Modified independence Comments: labored  gait pattern, marked L lateral trunk side bending with L stance, arms in mid guard, L LE IR throughout , wide base of support, decreased stance time on L, shortened step length B, more so on R                                                                                                                                TREATMENT DATE: 01/21/24 NuStep L 5 x 6 min GOALS MMT Lower Extremity Functional Score: 41 / 80 = 51.2 % TUG 14.28 S2S OHP yellow ball 2x10 Shoulder Ext 10lb 2x10 20lb resisted gait 4 way x 3 each   01/19/24:  Recumbent cycle L 3 x 6 min Leg press B 40# 3 x 10 Single leg press, R  40# 2 x 10, then 20# one set of 10  In ll bars for modified RDL's with 5# kettle bell in one hand.  Side stepping with 3# cuff weights each ankle, and standing alt hip abd with 3# wt, 15 reps each Standing on round airex pad, alt taps to 1/2round sea urchins, with 3# cuff wts each leg  Semi tandem standing lead foot on round airex, with forward punches with 2# therabar, also side to side rotations with therabar 10 each Heel lifts B with forefeet on 3' bar 10 x 3  01/14/24:  Nustep: L 5 x 6 min In ll bars for forward lunges with each leg on compliant side of BOSU mass practice Modified  RDL's 5# kettlebell, in ll bars, one hand on bar, to 20 platform to fatigue each leg  Side stepping length of ll bars with 2# cuff wts 5 x Standing feet shoulder width with forward punches with 2# medibar, also forward punches with reaches to side , all 10 reps Leg press B 40# 2 x 10 Single leg 20# 2 x 5 each Heel lifts B with forefeet on 3' bar 10 x 3  01/12/24 NuStep L 5 x 6 min 20lb resisted gait 4 way x 3 each 6in step ups x5 each Sit to stands 2x10   Cues for L weight shifts  Leg press 40lb 2x10, SL 2x5 each Ext and rows black 2x10   01/07/24 Bike L3.5 x6 min  Sit to stands 2x10   Cues for L weight shifts  4in step ups 2x5  Instability and weakness HS curls 35lb 2x10  LLE 20lb x10 Leg Ext 20lb 2x10 Shoulder Ext & Rows blue 2x10   12/22/23:  PT evaluation, instructed in 2 activities/ therex to perform at home until next session:  Forward step ups L with R knee drivers L foot on step for weight shift for/ back    PATIENT EDUCATION:  Education details: POC, goals Person educated: Patient Education method: Medical illustrator Education comprehension: verbalized understanding  HOME EXERCISE PROGRAM: Sit to stands   ASSESSMENT:  CLINICAL IMPRESSION: Patient is a 58 y.o. male who participated physical therapy session today  due to unsteady gait pattern, precipitated by CVA with L sided deficits in 2016 a fall with L hip fx and replacement in 2018.  He has postural changes, also quite weak L posterior hip musculature, balance deficits, and altered gait mechanics which contribute to a laborious gait. He has progressed increasing his LLE strength and his functional mobility. Continued  LE strengthening and  functional interventions. Instability with resisted side steps.  He tolerated session well.  He should benefit from skilled PT to address his deficits.    OBJECTIVE IMPAIRMENTS: decreased activity tolerance, decreased balance, decreased coordination, decreased  endurance, difficulty walking, decreased ROM, decreased strength, hypomobility, impaired perceived functional ability, impaired flexibility, impaired sensation, impaired vision/preception, improper body mechanics, postural dysfunction, and pain.   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   REHAB POTENTIAL: Good  CLINICAL DECISION MAKING: Evolving/moderate complexity  EVALUATION COMPLEXITY: Moderate   GOALS: Goals reviewed with patient? Yes  SHORT TERM GOALS: Target date: 2 weeks 01/05/24 I HEP Baseline: Goal status: ongoing 01/12/24, Met /24/25   LONG TERM GOALS: Target date: 12 weeks, 03/15/24  FGA improve from 18/30 to 24/30 or greater Baseline:  Goal status: INITIAL  2.  Improve L hip extension strength and L plantarflexion strength to 4/5 or greater for better stability, endurance, stair negotiation Baseline:  Goal status: Progressing 01/21/24  3.  Tug score improve to 14 sec or less Baseline: 18.08 Goal status: Progressing 14.28 01/21/24  4.  Lower Extremity Functional Score: 39 / 80 = 48.8 %improve to 50/80 or greater  Baseline:  Goal status: 01/21/24 Progressing 41 / 80 = 51.2 %   PLAN:  PT FREQUENCY: 2x/week  PT DURATION: 12 weeks  PLANNED INTERVENTIONS: 97110-Therapeutic exercises, 97530- Therapeutic activity, 97112- Neuromuscular re-education, 97535- Self Care, [id number]- Manual therapy, [phone]- Gait training, Patient/Family education, Balance training, and Stair training  PLAN FOR NEXT SESSION:  cardiovascular training, focused  strengthening for posterior chain musculature L Le, unilateral balance, light stretching L hip anterior and medial structures    Tanda KANDICE Sorrow, PTA 01/21/2024, 11:01 AM

## 2024-02-09 ENCOUNTER — Ambulatory Visit: Admitting: Family Medicine

## 2024-02-10 ENCOUNTER — Ambulatory Visit: Attending: Family Medicine | Admitting: Physical Therapy

## 2024-02-10 ENCOUNTER — Encounter: Payer: Self-pay | Admitting: Physical Therapy

## 2024-02-10 DIAGNOSIS — R262 Difficulty in walking, not elsewhere classified: Secondary | ICD-10-CM | POA: Diagnosis not present

## 2024-02-10 DIAGNOSIS — M25652 Stiffness of left hip, not elsewhere classified: Secondary | ICD-10-CM | POA: Diagnosis not present

## 2024-02-10 DIAGNOSIS — R293 Abnormal posture: Secondary | ICD-10-CM | POA: Insufficient documentation

## 2024-02-10 NOTE — Therapy (Signed)
 OUTPATIENT PHYSICAL THERAPY LOWER EXTREMITY TREATMENT   Patient Name: Adrian Neal MRN: [id number] DOB:1965-09-07, 58 y.o., male Today's Date: 02/10/2024  END OF SESSION:  PT End of Session - 02/10/24 1058     Visit Number 7    Date for Recertification  03/15/24    PT Start Time 1100    PT Stop Time 1145    PT Time Calculation (min) 45 min    Activity Tolerance Patient tolerated treatment well    Behavior During Therapy WFL for tasks assessed/performed            Past Medical History:  Diagnosis Date   Cataract    Mixed form OS   Chronic kidney disease    Diabetes mellitus without complication (HCC)    diagnosed at age 87   Eye problems    Heart murmur 1996   High cholesterol    patient denies but take preventative medicine   Hypertension    Hypertensive retinopathy    OU   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   Sleep apnea    on BiPAP   Stroke (HCC) 2016   Past Surgical History:  Procedure Laterality Date   ANTERIOR APPROACH HEMI HIP ARTHROPLASTY Left 01/01/2019   Procedure: ANTERIOR APPROACH total hip ARTHROPLASTY;  Surgeon: Fidel Rogue, MD;  Location: [facility] OR;  Service: Orthopedics;  Laterality: Left;   CATARACT EXTRACTION Right 1994   CATARACT EXTRACTION Left 11/18/2023   CATARACT EXTRACTION W/ INTRAOCULAR LENS IMPLANT  1994   EYE SURGERY Right 1991   vitrectomy   EYE SURGERY Right 1992   scar tissue removed from retina   EYE SURGERY Right 1994   Cat Sx   Patient Active Problem List   Diagnosis Date Noted   Trapezius strain 09/02/2022   Pneumonia of left lower lobe due to infectious organism 10/09/2021   Achrochordon 08/15/2021   Midline low back pain without sciatica 04/12/2021   Ataxia 02/01/2021   Obstructive sleep apnea 07/13/2020   Anemia 01/04/2020   Medication side effect 01/04/2020   Osteopenia 10/04/2019   Patellofemoral pain syndrome of both knees 06/08/2019   Pain in both knees 06/03/2019    Excessive daytime sleepiness 04/12/2019   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   Seasonal allergic rhinitis 03/11/2019   Need for pneumococcal vaccination 03/11/2019   Fracture of neck of femur (HCC) 01/13/2019   AKI (acute kidney injury) 01/08/2019   Acute blood loss anemia 01/08/2019   History of femur fracture 01/08/2019   Need for influenza vaccination 01/08/2019   Hospital discharge follow-up 01/08/2019   Closed left femoral fracture (HCC) 12/31/2018   Type 1 diabetes mellitus with diabetic neuropathy, unspecified (HCC) 12/17/2018   Type 1 diabetes mellitus with stage 3 chronic kidney disease (HCC) 08/12/2018   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) 08/12/2018   Type 1 diabetes mellitus with hyperglycemia (HCC) 08/12/2018   Vitamin D  deficiency 07/15/2018   Stage 3b chronic kidney disease (HCC) 06/16/2018   Screening for gout 06/15/2018   History of CVA (cerebrovascular accident) 06/01/2018   OSA (obstructive sleep apnea) 03/29/2015   Stroke (HCC)    Hypothyroidism 01/19/2015   Mixed hyperlipidemia 11/22/2014   Insulin  dependent diabetes mellitus    Intractable nausea and vomiting 07/27/2014   Vertigo 07/27/2014   Essential hypertension 07/27/2014   Type 1 diabetes mellitus (HCC) 05/29/2010   Hyperlipidemia 05/29/2010   Plantar  fasciitis 05/29/2010    PCP: Berneta Elsie Sayre MD  REFERRING PROVIDER: same  REFERRING DIAG: gait instability  THERAPY DIAG:  Difficulty in walking, not elsewhere classified  Stiffness of left hip, not elsewhere classified  Abnormal posture  Rationale for Evaluation and Treatment: Rehabilitation  ONSET DATE: CVA in 2016, fractured hip and THA 2018.  Pt reports decline in function, also more intensifying lower back pain since that time.  SUBJECTIVE:   SUBJECTIVE STATEMENT: I am ok, I'm good   I think when I had my stroke in 2016 I couldn't  walk.  The therapy ran out and I haven't done anything since then. The balance is the biggest issue with me. I have a really hard time maneuvering steps , do ok with B handrails  PERTINENT HISTORY: CVA in 2016 which affected L side, h/o L hip replacement  PAIN:  Are you having pain? Yes: NPRS scale: 0/10 Pain location: L knee Pain description: aching  Aggravating factors: activity Relieving factors: had steroid injection  PRECAUTIONS: None  RED FLAGS: None   WEIGHT BEARING RESTRICTIONS: No  FALLS:  Has patient fallen in last 6 months? 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  DIAGNOSTIC FINDINGS: MRI from 08/24/2023 which showed mild foraminal narrowing bilaterally at L5-S1 without clear neurologic impingement, and multilevel spondylosis with possible left L3 nerve root encroachment   PATIENT SURVEYS:  Lower Extremity Functional Score: 39 / 80 = 48.8 %  COGNITION: Overall cognitive status: Within functional limits for tasks assessed     SENSATION: Reports numbness R foot lat 3 toes, chronic    MUSCLE LENGTH: Hamstrings: Right nt deg; Left nt deg Thomas test: Right wnl deg; Left -10 deg  POSTURE: standing wide base of support, L foot with marked IR  PALPATION: Non tender to palpation L post /lat hip  LOWER EXTREMITY ROM:  Passive ROM Left eval Left 02/10/24  Hip flexion    Hip extension -5 20  Hip abduction 20 24  Hip adduction    Hip internal rotation    Hip external rotation nt   Knee flexion    Knee extension    Ankle  dorsiflexion    Ankle plantarflexion    Ankle inversion    Ankle eversion     (Blank rows = not tested)  LOWER EXTREMITY MMT:  MMT Right eval Left eval Left 01/21/24 Left 02/10/24  Hip flexion 4 4 4+ 5  Hip extension lock bridge  wnl 3-  4  Hip abduction  4 5 5   Hip adduction      Hip internal rotation      Hip external rotation  4-    Knee flexion   5 5  Knee extension   5 5  Ankle dorsiflexion wnl wnl 5 5  Ankle plantarflexion toe walking  4 3-    Ankle inversion      Ankle eversion       (Blank rows = not tested)   FUNCTIONAL TESTS:  Timed up and go (TUG): 18.08sec FGA 18/30  GAIT: Distance walked: in clinic , up to  68' Assistive device utilized: None Level of assistance: Modified independence Comments: labored gait pattern, marked L lateral trunk side bending with L stance, arms in mid guard, L LE IR throughout , wide base of support, decreased stance time on L, shortened step length B, more so on R                                                                                                                                TREATMENT DATE: 02/10/24 Bike L 4 x 6 min Goals  TUG 12 sec RLE 6in step ups, LLE 4 in step ups  Leg press 30 x15, 50lb x15. Single leg  20lb 2x10 Resisted side steps 30lb x 5 each Shoulder Ext 10lb 2x10 HS curls 35lb 2x12,  LLE 15lb 2x10 Leg Ext 15lb 2x10, LLE 5lb x10   01/21/24 NuStep L 5 x 6 min GOALS MMT Lower Extremity Functional Score: 41 / 80 = 51.2 % TUG 14.28 S2S OHP yellow ball 2x10 Shoulder Ext 10lb 2x10 20lb resisted gait 4 way x 3 each   01/19/24:  Recumbent cycle L 3 x 6 min Leg press B 40# 3 x 10 Single leg press, R  40# 2 x 10, then 20# one set of 10  In ll bars for modified RDL's with 5# kettle bell in one hand.  Side stepping with 3# cuff weights each ankle, and standing alt hip abd with 3# wt, 15 reps each Standing on round airex pad, alt taps to 1/2round sea urchins, with 3# cuff wts each leg  Semi tandem  standing lead foot on round airex, with forward punches with 2# therabar, also side to side rotations with therabar 10 each Heel lifts B with forefeet on 3' bar 10 x 3  01/14/24:  Nustep: L 5 x 6 min In ll bars for forward lunges with each leg on compliant side of BOSU mass practice Modified RDL's 5# kettlebell, in ll bars, one hand on bar, to 20 platform to fatigue each leg  Side stepping length of ll bars with 2# cuff wts 5 x Standing feet shoulder width with forward punches with 2# medibar, also forward punches with reaches to side , all 10 reps Leg press B 40# 2 x 10 Single leg 20# 2 x 5 each Heel lifts B with forefeet on 3' bar 10 x 3  01/12/24 NuStep L 5 x 6 min 20lb resisted gait 4 way x 3 each 6in step ups x5 each Sit to stands 2x10   Cues for L weight shifts  Leg press 40lb 2x10, SL 2x5 each Ext and rows black 2x10   01/07/24 Bike L3.5 x6 min  Sit to stands 2x10   Cues for L weight shifts  4in step ups 2x5  Instability and weakness HS curls 35lb 2x10  LLE 20lb x10 Leg Ext 20lb 2x10 Shoulder Ext & Rows blue 2x10   12/22/23:  PT evaluation, instructed in 2 activities/  therex to perform at home until next session:  Forward step ups L with R knee drivers L foot on step for weight shift for/ back    PATIENT EDUCATION:  Education details: POC, goals Person educated: Patient Education method: Medical illustrator Education comprehension: verbalized understanding  HOME EXERCISE PROGRAM: Sit to stands   ASSESSMENT:  CLINICAL IMPRESSION: Patient is a 58 y.o. male who participated physical therapy session today due to unsteady gait pattern, precipitated by CVA with L sided deficits in 2016 a fall with L hip fx and replacement in 2018.  He has postural changes, also quite weak L posterior hip musculature, balance deficits, and altered gait mechanics which contribute to a laborious gait. Has progressed increasing strength and ROM.  Continued  LE strengthening and   functional interventions. Instability with resisted side steps.  LLE weakens and instability with step ups. He tolerated session well.  He should benefit from skilled PT to address his deficits.    OBJECTIVE IMPAIRMENTS: decreased activity tolerance, decreased balance, decreased coordination, decreased endurance, difficulty walking, decreased ROM, decreased strength, hypomobility, impaired perceived functional ability, impaired flexibility, impaired sensation, impaired vision/preception, improper body mechanics, postural dysfunction, and pain.   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   REHAB POTENTIAL: Good  CLINICAL DECISION MAKING: Evolving/moderate complexity  EVALUATION COMPLEXITY: Moderate   GOALS: Goals reviewed with patient? Yes  SHORT TERM GOALS: Target date: 2 weeks 01/05/24 I HEP Baseline: Goal status: ongoing 01/12/24, Met /24/25   LONG TERM GOALS: Target date: 12 weeks, 03/15/24  FGA improve from 18/30 to 24/30 or greater Baseline:  Goal status: INITIAL  2.  Improve L hip extension strength and L plantarflexion strength to 4/5 or greater for better stability, endurance, stair negotiation Baseline:  Goal status: Progressing 01/21/24, Met 02/10/24  3.  Tug score improve to 14 sec or less Baseline: 18.08 Goal status: Progressing 14.28 01/21/24, Met 12 sec 02/10/24  4.  Lower Extremity Functional Score: 39 / 80 = 48.8 %improve to 50/80 or greater  Baseline:  Goal status: 01/21/24 Progressing 41 / 80 = 51.2 %   PLAN:  PT FREQUENCY:  2x/week  PT DURATION: 12 weeks  PLANNED INTERVENTIONS: 97110-Therapeutic exercises, 97530- Therapeutic activity, 97112- Neuromuscular re-education, 97535- Self Care, 02859- Manual therapy, [phone]- Gait training, Patient/Family education, Balance training, and Stair training  PLAN FOR NEXT SESSION:  cardiovascular training, focused strengthening for posterior chain musculature L Le, unilateral balance, light stretching L hip anterior and medial structures    Tanda KANDICE Sorrow, PTA 02/10/2024, 10:59 AM

## 2024-02-12 ENCOUNTER — Ambulatory Visit: Admitting: Family Medicine

## 2024-02-12 ENCOUNTER — Encounter: Payer: Self-pay | Admitting: Family Medicine

## 2024-02-12 ENCOUNTER — Ambulatory Visit: Admitting: Physical Therapy

## 2024-02-12 ENCOUNTER — Encounter: Payer: Self-pay | Admitting: Physical Therapy

## 2024-02-12 VITALS — BP 118/78 | HR 75 | Temp 97.4°F | Ht 67.0 in | Wt 193.8 lb

## 2024-02-12 DIAGNOSIS — R2681 Unsteadiness on feet: Secondary | ICD-10-CM | POA: Diagnosis not present

## 2024-02-12 DIAGNOSIS — R293 Abnormal posture: Secondary | ICD-10-CM

## 2024-02-12 DIAGNOSIS — R262 Difficulty in walking, not elsewhere classified: Secondary | ICD-10-CM | POA: Diagnosis not present

## 2024-02-12 DIAGNOSIS — E1040 Type 1 diabetes mellitus with diabetic neuropathy, unspecified: Secondary | ICD-10-CM

## 2024-02-12 DIAGNOSIS — Z23 Encounter for immunization: Secondary | ICD-10-CM

## 2024-02-12 DIAGNOSIS — M25652 Stiffness of left hip, not elsewhere classified: Secondary | ICD-10-CM

## 2024-02-12 DIAGNOSIS — I10 Essential (primary) hypertension: Secondary | ICD-10-CM

## 2024-02-12 NOTE — Progress Notes (Signed)
 Established Patient Office Visit   Subjective:  Patient ID: Adrian Neal, male    DOB: Sep 08, 1965  Age: 58 y.o. MRN: [id number]  No chief complaint on file.   HPI Encounter Diagnoses  Name Primary?   Immunization due Yes   Gait instability    Type 1 diabetes mellitus with diabetic neuropathy, unspecified (HCC)    Essential hypertension    Follow-up of above.  Physical therapy has been helping gait instability.  They are working on strengthening.  He is soon to start therapy to improve balance.  Blood pressure under better control with current regimen.  Neurontin  has been helpful with neuropathy.  There is less pain and paresthesia.   Review of Systems  Constitutional: Negative.   HENT: Negative.    Eyes:  Negative for blurred vision, discharge and redness.  Respiratory: Negative.    Cardiovascular: Negative.   Gastrointestinal:  Negative for abdominal pain.  Genitourinary: Negative.   Musculoskeletal: Negative.  Negative for myalgias.  Skin:  Negative for rash.  Neurological:  Negative for tingling, loss of consciousness and weakness.  Endo/Heme/Allergies:  Negative for polydipsia.     Current Outpatient Medications:    atorvastatin  (LIPITOR) 40 MG tablet, TAKE 1 TABLET BY MOUTH ONCE DAILY AT  6  PM, Disp: 90 tablet, Rfl: 3   Blood Glucose Monitoring Suppl (ONETOUCH VERIO FLEX SYSTEM) w/Device KIT, 1 Device by Does not apply route in the morning, at noon, in the evening, and at bedtime., Disp: 1 kit, Rfl: 0   carvedilol  (COREG ) 25 MG tablet, Take 1 tablet by mouth twice daily, Disp: 180 tablet, Rfl: 0   chlorthalidone (HYGROTON) 25 MG tablet, Take 25 mg by mouth daily., Disp: , Rfl:    clopidogrel  (PLAVIX ) 75 MG tablet, TAKE 1 TABLET BY MOUTH ONCE DAILY TAKE  WITH  ASPIRIN   FOR  THREE  MONTHS  THEN  STOP  ASPIRIN , Disp: 90 tablet, Rfl: 3   Continuous Glucose Sensor (FREESTYLE LIBRE 2 PLUS SENSOR) MISC, 1 Device by Does not apply route every 14 (fourteen) days., Disp: 6  each, Rfl: 3   gabapentin  (NEURONTIN ) 100 MG capsule, Take 1 capsule (100 mg total) by mouth at bedtime., Disp: 30 capsule, Rfl: 3   glucose blood (ONETOUCH VERIO) test strip, 1 each by Other route 3 (three) times daily. Use as instructed, Disp: 300 each, Rfl: 3   Insulin  Infusion Pump (T:SLIM INSULIN  PUMP) DEVI, by Does not apply route., Disp: , Rfl:    insulin  lispro (HUMALOG ) 100 UNIT/ML injection, Max daily dose of 100 units via pump DX E10.59 (vials), Disp: 90 mL, Rfl: 4   insulin  lispro (HUMALOG ) 100 UNIT/ML injection, INJECT UNDER THE SKIN DAILY VIA PUMP. MAX DOSE PER DAY: 100 UNITS, Disp: 120 mL, Rfl: 3   lisinopril  (ZESTRIL ) 40 MG tablet, Take 40 mg by mouth daily., Disp: , Rfl:    methocarbamol  (ROBAXIN ) 500 MG tablet, Take 1 tablet (500 mg total) by mouth every 8 (eight) hours as needed for muscle spasms. (Patient not taking: Reported on 01/02/2024), Disp: 40 tablet, Rfl: 0   pantoprazole  (PROTONIX ) 40 MG tablet, Take 1 tablet by mouth once daily, Disp: 90 tablet, Rfl: 0   sodium bicarbonate 650 MG tablet, Take 650 mg by mouth 2 (two) times daily., Disp: , Rfl:    spironolactone  (ALDACTONE ) 25 MG tablet, Take 0.5 tablets (12.5 mg total) by mouth daily., Disp: 15 tablet, Rfl: 11   Objective:     BP 118/78 (BP Location: Right Arm,  Patient Position: Sitting, Cuff Size: Normal)   Pulse 75   Temp (!) 97.4 F (36.3 C) (Temporal)   Ht 5' 7 (1.702 m)   Wt 193 lb 12.8 oz (87.9 kg)   SpO2 98%   BMI 30.35 kg/m    Physical Exam Constitutional:      General: He is not in acute distress.    Appearance: Normal appearance. He is not ill-appearing, toxic-appearing or diaphoretic.  HENT:     Head: Normocephalic and atraumatic.     Right Ear: External ear normal.     Left Ear: External ear normal.  Eyes:     General: No scleral icterus.       Right eye: No discharge.        Left eye: No discharge.     Extraocular Movements: Extraocular movements intact.     Conjunctiva/sclera:  Conjunctivae normal.  Pulmonary:     Effort: Pulmonary effort is normal. No respiratory distress.  Skin:    General: Skin is warm and dry.  Neurological:     Mental Status: He is alert and oriented to person, place, and time.  Psychiatric:        Mood and Affect: Mood normal.        Behavior: Behavior normal.      No results found for any visits on 02/12/24.    The ASCVD Risk score (Arnett DK, et al., 2019) failed to calculate for the following reasons:   Risk score cannot be calculated because patient has a medical history suggesting prior/existing ASCVD    Assessment & Plan:   Immunization due -     Flu vaccine trivalent PF, 6mos and older(Flulaval,Afluria,Fluarix,Fluzone) -     Varicella-zoster vaccine IM  Gait instability  Type 1 diabetes mellitus with diabetic neuropathy, unspecified (HCC)  Essential hypertension    Return in about 6 months (around 08/12/2024) for chronic disease follow-up, annual physical.  Continue current regimen for hypertension.  Continue gabapentin  for neuropathy.  Continue physical therapy for gait instability and balance.  Discussed uptitrating the gabapentin  but the at bedtime dose seems to be working now.  Elsie Sim Lent, MD

## 2024-02-12 NOTE — Therapy (Signed)
 OUTPATIENT PHYSICAL THERAPY LOWER EXTREMITY TREATMENT   Patient Name: Adrian Neal MRN: [id number] DOB:1965/06/23, 58 y.o., male Today's Date: 02/12/2024  END OF SESSION:  PT End of Session - 02/12/24 1104     Visit Number 8    Date for Recertification  03/15/24    PT Start Time 1104    PT Stop Time 1145    PT Time Calculation (min) 41 min    Activity Tolerance Patient tolerated treatment well    Behavior During Therapy WFL for tasks assessed/performed            Past Medical History:  Diagnosis Date   Cataract    Mixed form OS   Chronic kidney disease    Diabetes mellitus without complication (HCC)    diagnosed at age 55   Eye problems    Heart murmur 1996   High cholesterol    patient denies but take preventative medicine   Hypertension    Hypertensive retinopathy    OU   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   Sleep apnea    on BiPAP   Stroke (HCC) 2016   Past Surgical History:  Procedure Laterality Date   ANTERIOR APPROACH HEMI HIP ARTHROPLASTY Left 01/01/2019   Procedure: ANTERIOR APPROACH total hip ARTHROPLASTY;  Surgeon: Fidel Rogue, MD;  Location: [facility] OR;  Service: Orthopedics;  Laterality: Left;   CATARACT EXTRACTION Right 1994   CATARACT EXTRACTION Left 11/18/2023   CATARACT EXTRACTION W/ INTRAOCULAR LENS IMPLANT  1994   EYE SURGERY Right 1991   vitrectomy   EYE SURGERY Right 1992   scar tissue removed from retina   EYE SURGERY Right 1994   Cat Sx   Patient Active Problem List   Diagnosis Date Noted   Trapezius strain 09/02/2022   Pneumonia of left lower lobe due to infectious organism 10/09/2021   Achrochordon 08/15/2021   Midline low back pain without sciatica 04/12/2021   Ataxia 02/01/2021   Obstructive sleep apnea 07/13/2020   Anemia 01/04/2020   Medication side effect 01/04/2020   Osteopenia 10/04/2019   Patellofemoral pain syndrome of both knees 06/08/2019   Pain in both knees 06/03/2019    Excessive daytime sleepiness 04/12/2019   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   Seasonal allergic rhinitis 03/11/2019   Need for pneumococcal vaccination 03/11/2019   Fracture of neck of femur (HCC) 01/13/2019   AKI (acute kidney injury) 01/08/2019   Acute blood loss anemia 01/08/2019   History of femur fracture 01/08/2019   Need for influenza vaccination 01/08/2019   Hospital discharge follow-up 01/08/2019   Closed left femoral fracture (HCC) 12/31/2018   Type 1 diabetes mellitus with diabetic neuropathy, unspecified (HCC) 12/17/2018   Type 1 diabetes mellitus with stage 3 chronic kidney disease (HCC) 08/12/2018   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) 08/12/2018   Type 1 diabetes mellitus with hyperglycemia (HCC) 08/12/2018   Vitamin D  deficiency 07/15/2018   Stage 3b chronic kidney disease (HCC) 06/16/2018   Screening for gout 06/15/2018   History of CVA (cerebrovascular accident) 06/01/2018   OSA (obstructive sleep apnea) 03/29/2015   Stroke (HCC)    Hypothyroidism 01/19/2015   Mixed hyperlipidemia 11/22/2014   Insulin  dependent diabetes mellitus    Intractable nausea and vomiting 07/27/2014   Vertigo 07/27/2014   Essential hypertension 07/27/2014   Type 1 diabetes mellitus (HCC) 05/29/2010   Hyperlipidemia 05/29/2010   Plantar  fasciitis 05/29/2010    PCP: Berneta Elsie Sayre MD  REFERRING PROVIDER: same  REFERRING DIAG: gait instability  THERAPY DIAG:  Difficulty in walking, not elsewhere classified  Stiffness of left hip, not elsewhere classified  Abnormal posture  Rationale for Evaluation and Treatment: Rehabilitation  ONSET DATE: CVA in 2016, fractured hip and THA 2018.  Pt reports decline in function, also more intensifying lower back pain since that time.  SUBJECTIVE:   SUBJECTIVE STATEMENT: Good, but still feeling it  I think when I had my stroke in 2016 I  couldn't walk.  The therapy ran out and I haven't done anything since then. The balance is the biggest issue with me. I have a really hard time maneuvering steps , do ok with B handrails  PERTINENT HISTORY: CVA in 2016 which affected L side, h/o L hip replacement  PAIN:  Are you having pain? Yes: NPRS scale: 0/10 Pain location: L knee Pain description: aching  Aggravating factors: activity Relieving factors: had steroid injection  PRECAUTIONS: None  RED FLAGS: None   WEIGHT BEARING RESTRICTIONS: No  FALLS:  Has patient fallen in last 6 months? 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  DIAGNOSTIC FINDINGS: MRI from 08/24/2023 which showed mild foraminal narrowing bilaterally at L5-S1 without clear neurologic impingement, and multilevel spondylosis with possible left L3 nerve root encroachment   PATIENT SURVEYS:  Lower Extremity Functional Score: 39 / 80 = 48.8 %  COGNITION: Overall cognitive status: Within functional limits for tasks assessed     SENSATION: Reports numbness R foot lat 3 toes, chronic    MUSCLE LENGTH: Hamstrings: Right nt deg; Left nt deg Thomas test: Right wnl deg; Left -10 deg  POSTURE: standing wide base of support, L foot with marked IR  PALPATION: Non tender to palpation L post /lat hip  LOWER EXTREMITY ROM:  Passive ROM Left eval Left 02/10/24  Hip flexion    Hip extension -5 20  Hip abduction 20 24  Hip adduction    Hip internal rotation    Hip external rotation nt   Knee flexion    Knee extension    Ankle  dorsiflexion    Ankle plantarflexion    Ankle inversion    Ankle eversion     (Blank rows = not tested)  LOWER EXTREMITY MMT:  MMT Right eval Left eval Left 01/21/24 Left 02/10/24  Hip flexion 4 4 4+ 5  Hip extension lock bridge  wnl 3-  4  Hip abduction  4 5 5   Hip adduction      Hip internal rotation      Hip external rotation  4-    Knee flexion   5 5  Knee extension   5 5  Ankle dorsiflexion wnl wnl 5 5  Ankle plantarflexion toe walking  4 3-    Ankle inversion      Ankle eversion       (Blank rows = not tested)   FUNCTIONAL TESTS:  Timed up and go (TUG): 18.08sec FGA 18/30  GAIT: Distance walked: in clinic , up to 1'  Assistive device utilized: None Level of assistance: Modified independence Comments: labored gait pattern, marked L lateral trunk side bending with L stance, arms in mid guard, L LE IR throughout , wide base of support, decreased stance time on L, shortened step length B, more so on R                                                                                                                                TREATMENT DATE: 02/12/24 4in forward and lateral step ups x10 each NuStep L5 x 6 min Resisted gait 30lb 4 way x 3 each  Heel raises black bar 2x10 Shoulder Ext 10lb 2x10 Sit to stand LE on airex 3x10 Leg Ext 15lb 3x10   02/10/24 Bike L 4 x 6 min Goals  TUG 12 sec RLE 6in step ups, LLE 4 in step ups  Leg press 30 x15, 50lb x15. Single leg  20lb 2x10 Resisted side steps 30lb x 5 each Shoulder Ext 10lb 2x10 HS curls 35lb 2x12,  LLE 15lb 2x10 Leg Ext 15lb 2x10, LLE 5lb x10   01/21/24 NuStep L 5 x 6 min GOALS MMT Lower Extremity Functional Score: 41 / 80 = 51.2 % TUG 14.28 S2S OHP yellow ball 2x10 Shoulder Ext 10lb 2x10 20lb resisted gait 4 way x 3 each   01/19/24:  Recumbent cycle L 3 x 6 min Leg press B 40# 3 x 10 Single leg press, R  40# 2 x 10, then 20# one set of 10  In ll bars for modified RDL's with 5# kettle bell in  one hand.  Side stepping with 3# cuff weights each ankle, and standing alt hip abd with 3# wt, 15 reps each Standing on round airex pad, alt taps to 1/2round sea urchins, with 3# cuff wts each leg  Semi tandem standing lead foot on round airex, with forward punches with 2# therabar, also side to side rotations with therabar 10 each Heel lifts B with forefeet on 3' bar 10 x 3  01/14/24:  Nustep: L 5 x 6 min In ll bars for forward lunges with each leg on compliant side of BOSU mass practice Modified RDL's 5# kettlebell, in ll bars, one hand on bar, to 20 platform to fatigue each leg  Side stepping length of ll bars with 2# cuff wts 5 x Standing feet shoulder width with forward punches with 2# medibar, also forward punches with reaches to side , all 10 reps Leg press B 40# 2 x 10 Single leg 20# 2 x 5 each Heel lifts B with forefeet on 3' bar 10 x 3  01/12/24 NuStep L 5 x 6 min 20lb resisted gait 4 way x 3 each 6in step ups x5 each Sit to stands 2x10   Cues for L weight shifts  Leg press 40lb 2x10, SL 2x5 each Ext and rows black 2x10   01/07/24 Bike L3.5 x6 min  Sit to stands 2x10  Cues for L weight shifts  4in step ups 2x5  Instability and weakness HS curls 35lb 2x10  LLE 20lb x10 Leg Ext 20lb 2x10 Shoulder Ext & Rows blue 2x10   12/22/23:  PT evaluation, instructed in 2 activities/ therex to perform at home until next session:  Forward step ups L with R knee drivers L foot on step for weight shift for/ back    PATIENT EDUCATION:  Education details: POC, goals Person educated: Patient Education method: Medical illustrator Education comprehension: verbalized understanding  HOME EXERCISE PROGRAM: Sit to stands   ASSESSMENT:  CLINICAL IMPRESSION: Patient is a 58 y.o. male who participated physical therapy session today due to unsteady gait pattern, precipitated by CVA with L sided deficits in 2016 a fall with L hip fx and replacement in 2018.  He has postural  changes, also quite weak L posterior hip musculature, balance deficits, and altered gait mechanics which contribute to a laborious gait.  Continued to progress with functional strength and balance. Decrease L hip and knee flexion with resisted gait. CGA needed with all step up interventions.  LLE weakens and instability present during session.  He tolerated session well.  He should benefit from skilled PT to address his deficits.    OBJECTIVE IMPAIRMENTS: decreased activity tolerance, decreased balance, decreased coordination, decreased endurance, difficulty walking, decreased ROM, decreased strength, hypomobility, impaired perceived functional ability, impaired flexibility, impaired sensation, impaired vision/preception, improper body mechanics, postural dysfunction, and pain.   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   REHAB POTENTIAL: Good  CLINICAL DECISION MAKING: Evolving/moderate complexity  EVALUATION COMPLEXITY: Moderate   GOALS: Goals reviewed with patient? Yes  SHORT TERM GOALS: Target date: 2 weeks 01/05/24 I HEP Baseline: Goal status: ongoing 01/12/24, Met /24/25   LONG TERM GOALS: Target date: 12 weeks, 03/15/24  FGA improve from 18/30 to 24/30 or greater Baseline:  Goal status: INITIAL  2.  Improve L hip extension strength and L plantarflexion strength to 4/5 or greater for better stability, endurance, stair negotiation Baseline:  Goal status: Progressing 01/21/24, Met 02/10/24  3.  Tug score improve to 14 sec or less Baseline:  18.08 Goal status: Progressing 14.28 01/21/24, Met 12 sec 02/10/24  4.  Lower Extremity Functional Score: 39 / 80 = 48.8 %improve to 50/80 or greater  Baseline:  Goal status: 01/21/24 Progressing 41 / 80 = 51.2 %   PLAN:  PT FREQUENCY: 2x/week  PT DURATION: 12 weeks  PLANNED INTERVENTIONS: 97110-Therapeutic exercises, 97530- Therapeutic activity, 97112- Neuromuscular re-education, 97535- Self Care, [id number]- Manual therapy, [phone]- Gait training, Patient/Family education, Balance training, and Stair training  PLAN FOR NEXT SESSION:  cardiovascular training, focused strengthening for posterior chain musculature L Le, unilateral balance, light stretching L hip anterior and medial structures    Tanda KANDICE Sorrow, PTA 02/12/2024, 11:04 AM

## 2024-02-13 DIAGNOSIS — E109 Type 1 diabetes mellitus without complications: Secondary | ICD-10-CM | POA: Diagnosis not present

## 2024-02-17 ENCOUNTER — Ambulatory Visit: Admitting: Physical Therapy

## 2024-02-19 ENCOUNTER — Ambulatory Visit: Admitting: Physical Therapy

## 2024-03-04 ENCOUNTER — Other Ambulatory Visit: Payer: Self-pay | Admitting: Family Medicine

## 2024-03-04 DIAGNOSIS — E1040 Type 1 diabetes mellitus with diabetic neuropathy, unspecified: Secondary | ICD-10-CM

## 2024-03-15 ENCOUNTER — Other Ambulatory Visit: Payer: Self-pay | Admitting: Family Medicine

## 2024-03-15 DIAGNOSIS — I10 Essential (primary) hypertension: Secondary | ICD-10-CM

## 2024-03-15 DIAGNOSIS — E109 Type 1 diabetes mellitus without complications: Secondary | ICD-10-CM | POA: Diagnosis not present

## 2024-03-23 ENCOUNTER — Ambulatory Visit: Admitting: Podiatry

## 2024-03-29 ENCOUNTER — Other Ambulatory Visit: Payer: Self-pay | Admitting: Family Medicine

## 2024-03-29 DIAGNOSIS — K219 Gastro-esophageal reflux disease without esophagitis: Secondary | ICD-10-CM

## 2024-04-09 ENCOUNTER — Encounter: Payer: Self-pay | Admitting: Internal Medicine

## 2024-04-16 ENCOUNTER — Encounter (INDEPENDENT_AMBULATORY_CARE_PROVIDER_SITE_OTHER): Payer: PPO | Admitting: Ophthalmology

## 2024-05-06 ENCOUNTER — Ambulatory Visit (INDEPENDENT_AMBULATORY_CARE_PROVIDER_SITE_OTHER): Admitting: Family Medicine

## 2024-05-06 ENCOUNTER — Encounter: Payer: Self-pay | Admitting: Family Medicine

## 2024-05-06 VITALS — BP 122/68 | HR 78 | Temp 97.6°F | Ht 67.0 in | Wt 195.2 lb

## 2024-05-06 DIAGNOSIS — E1040 Type 1 diabetes mellitus with diabetic neuropathy, unspecified: Secondary | ICD-10-CM

## 2024-05-06 DIAGNOSIS — R58 Hemorrhage, not elsewhere classified: Secondary | ICD-10-CM

## 2024-05-06 LAB — TSH: TSH: 2.47 u[IU]/mL (ref 0.35–5.50)

## 2024-05-06 LAB — VITAMIN B12: Vitamin B-12: 537 pg/mL (ref 211–911)

## 2024-05-06 NOTE — Progress Notes (Signed)
 "  Established Patient Office Visit   Subjective:  Patient ID: Adrian Neal, male    DOB: 08-Mar-1966  Age: 59 y.o. MRN: [id number]  Chief Complaint  Patient presents with   Rash    Red spot on left forearm no pain appeared 2 days ago     Rash   Encounter Diagnoses  Name Primary?   Ecchymosis Yes   Type 1 diabetes mellitus with diabetic neuropathy, unspecified (HCC)    Concern of a spot on his left forearm that arose spontaneously.  Feels as though the neuropathy in his right foot may be worsening.  It is more of the tingling and numbness than anything else.  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  It affects the balls of his feet.  There is little pain.   Review of Systems  Constitutional: Negative.   HENT: Negative.    Eyes:  Negative for blurred vision, discharge and redness.  Respiratory: Negative.    Cardiovascular: Negative.   Gastrointestinal:  Negative for abdominal pain.  Genitourinary: Negative.   Musculoskeletal: Negative.  Negative for myalgias.  Skin:  Positive for rash.  Neurological:  Positive for tingling. Negative for loss of consciousness and weakness.  Endo/Heme/Allergies:  Negative for polydipsia.    Current Medications[1]   Objective:     BP 122/68 (BP Location: Left Arm, Patient Position: Sitting, Cuff Size: Normal)   Pulse 78   Temp 97.6 F (36.4 C) (Oral)   Ht 5' 7 (1.702 m)   Wt 195 lb 3.2 oz (88.5 kg)   SpO2 99%   BMI 30.57 kg/m    Physical Exam Constitutional:      General: He is not in acute distress.    Appearance: Normal appearance. He is not ill-appearing, toxic-appearing or diaphoretic.  HENT:     Head: Normocephalic and atraumatic.     Right Ear: External ear normal.     Left Ear: External ear normal.  Eyes:     General: No scleral icterus.       Right eye: No discharge.        Left eye: No discharge.     Extraocular  Movements: Extraocular movements intact.     Conjunctiva/sclera: Conjunctivae normal.  Cardiovascular:     Pulses:          Dorsalis pedis pulses are 1+ on the right side and 1+ on the left side.       Posterior tibial pulses are 1+ on the right side and 1+ on the left side.     Comments: Capillary refill in the great toes is excellent. Pulmonary:     Effort: Pulmonary effort is normal. No respiratory distress.  Skin:    General: Skin is warm and dry.     Findings: Ecchymosis present.      Neurological:     Mental Status: He is alert and oriented to person, place, and time.  Psychiatric:        Mood and Affect: Mood normal.        Behavior: Behavior normal.      No results found for any visits on 05/06/24.    The ASCVD Risk score (Arnett DK, et al., 2019) failed to calculate for the following reasons:   Risk score cannot be calculated because patient has a medical history suggesting prior/existing ASCVD   * - Cholesterol units were assumed  Assessment & Plan:   Ecchymosis  Type 1 diabetes mellitus with diabetic neuropathy, unspecified (HCC) -     Vitamin B12 -     TSH    Return Should have follow-up appointment in April..  Advised that ecchymosis should resolve in 4 to 6 weeks.  He understands its associated with aging skin and the Plavix  that he takes.  He will let me know if it does not resolve.  Believe that the neuropathy is associated with his diabetes.  ABI testing in 2023 showed excellent blood flow in his lower extremities.  Will check B12 and TSH levels today.  Elsie Sim Lent, MD    [1]  Current Outpatient Medications:    atorvastatin  (LIPITOR) 40 MG tablet, TAKE 1 TABLET BY MOUTH ONCE DAILY AT  6  PM, Disp: 90 tablet, Rfl: 3   Blood Glucose Monitoring Suppl (ONETOUCH VERIO FLEX SYSTEM) w/Device KIT, 1 Device by Does not apply route in the morning, at noon, in the evening, and at bedtime., Disp: 1 kit, Rfl: 0   carvedilol  (COREG ) 25 MG tablet, Take  1 tablet by mouth twice daily, Disp: 180 tablet, Rfl: 0   chlorthalidone (HYGROTON) 25 MG tablet, Take 25 mg by mouth daily., Disp: , Rfl:    clopidogrel  (PLAVIX ) 75 MG tablet, TAKE 1 TABLET BY MOUTH ONCE DAILY TAKE  WITH  ASPIRIN   FOR  THREE  MONTHS  THEN  STOP  ASPIRIN , Disp: 90 tablet, Rfl: 3   Continuous Glucose Sensor (FREESTYLE LIBRE 2 PLUS SENSOR) MISC, 1 Device by Does not apply route every 14 (fourteen) days., Disp: 6 each, Rfl: 3   gabapentin  (NEURONTIN ) 100 MG capsule, Take 1 capsule by mouth at bedtime, Disp: 90 capsule, Rfl: 2   glucose blood (ONETOUCH VERIO) test strip, 1 each by Other route 3 (three) times daily. Use as instructed, Disp: 300 each, Rfl: 3   Insulin  Infusion Pump (T:SLIM INSULIN  PUMP) DEVI, by Does not apply route., Disp: , Rfl:    insulin  lispro (HUMALOG ) 100 UNIT/ML injection, Max daily dose of 100 units via pump DX E10.59 (vials), Disp: 90 mL, Rfl: 4   insulin  lispro (HUMALOG ) 100 UNIT/ML injection, INJECT UNDER THE SKIN DAILY VIA PUMP. MAX DOSE PER DAY: 100 UNITS, Disp: 120 mL, Rfl: 3   lisinopril  (ZESTRIL ) 40 MG tablet, Take 40 mg by mouth daily., Disp: , Rfl:    pantoprazole  (PROTONIX ) 40 MG tablet, Take 1 tablet by mouth once daily, Disp: 90 tablet, Rfl: 0   sodium bicarbonate 650 MG tablet, Take 650 mg by mouth 2 (two) times daily., Disp: , Rfl:    spironolactone  (ALDACTONE ) 25 MG tablet, Take 0.5 tablets (12.5 mg total) by mouth daily., Disp: 15 tablet, Rfl: 11  "

## 2024-05-07 ENCOUNTER — Ambulatory Visit: Payer: Self-pay | Admitting: Family Medicine

## 2024-05-09 ENCOUNTER — Encounter: Payer: Self-pay | Admitting: Internal Medicine

## 2024-05-17 NOTE — Progress Notes (Signed)
 " [facility] - Clinic Note  05/19/2024     CHIEF COMPLAINT Patient presents for Retina Follow Up   HISTORY OF PRESENT ILLNESS: Adrian Neal is a 59 y.o. male who presents to the clinic today for:   HPI     Retina Follow Up   Patient presents with  Diabetic Retinopathy.  In both eyes.  This started 1 year ago.  Severity is moderate.  Duration of 6 years.  Since onset it is stable.  I, the attending physician,  performed the HPI with the patient and updated documentation appropriately.        Comments   Pt states no concerns with vision, he had OS cat sx 2025. Pt is wearing new PALs. Pt denies FOL/floaters/pain/ats. BS=112 fasting, this morning A1c=6.8, 4 months ago      Last edited by Valdemar Rogue, MD on 05/19/2024  9:42 PM.     Pt states   Referring physician: Berneta Elsie Sayre, MD [address] [place],  [place] [address]  HISTORICAL INFORMATION:   Selected notes from the MEDICAL RECORD NUMBER Referred by Dr. Elsie Berneta for DM exam LEE:  Ocular Hx-cataract OD, pseudo OS PMH-DM (type 1, A1C: 8.6, takes novolog ), heart murmur, HLD, HTN, stroke    CURRENT MEDICATIONS: No current outpatient medications on file. (Ophthalmic Drugs)   No current facility-administered medications for this visit. (Ophthalmic Drugs)   Current Outpatient Medications (Other)  Medication Sig   atorvastatin  (LIPITOR) 40 MG tablet TAKE 1 TABLET BY MOUTH ONCE DAILY AT  6  PM   Blood Glucose Monitoring Suppl (ONETOUCH VERIO FLEX SYSTEM) w/Device KIT 1 Device by Does not apply route in the morning, at noon, in the evening, and at bedtime.   carvedilol  (COREG ) 25 MG tablet Take 1 tablet by mouth twice daily   chlorthalidone (HYGROTON) 25 MG tablet Take 25 mg by mouth daily.   clopidogrel  (PLAVIX ) 75 MG tablet TAKE 1 TABLET BY MOUTH ONCE DAILY TAKE  WITH  ASPIRIN   FOR  THREE  MONTHS  THEN  STOP  ASPIRIN    Continuous Glucose Sensor (FREESTYLE LIBRE 2 PLUS  SENSOR) MISC 1 Device by Does not apply route every 14 (fourteen) days.   gabapentin  (NEURONTIN ) 100 MG capsule Take 1 capsule by mouth at bedtime   glucose blood (ONETOUCH VERIO) test strip 1 each by Other route 3 (three) times daily. Use as instructed   Insulin  Infusion Pump (T:SLIM INSULIN  PUMP) DEVI by Does not apply route.   insulin  lispro (HUMALOG ) 100 UNIT/ML injection Max daily dose of 100 units via pump DX E10.59 (vials)   insulin  lispro (HUMALOG ) 100 UNIT/ML injection INJECT UNDER THE SKIN DAILY VIA PUMP. MAX DOSE PER DAY: 100 UNITS   lisinopril  (ZESTRIL ) 40 MG tablet Take 40 mg by mouth daily.   pantoprazole  (PROTONIX ) 40 MG tablet Take 1 tablet by mouth once daily   sodium bicarbonate 650 MG tablet Take 650 mg by mouth 2 (two) times daily.   spironolactone  (ALDACTONE ) 25 MG tablet Take 0.5 tablets (12.5 mg total) by mouth daily.   No current facility-administered medications for this visit. (Other)   REVIEW OF SYSTEMS: ROS   Positive for: Neurological, Musculoskeletal, Endocrine, Eyes, Respiratory Negative for: Constitutional, Gastrointestinal, Skin, Genitourinary, HENT, Cardiovascular, Psychiatric, Allergic/Imm, Heme/Lymph Last edited by Elnor Avelina RAMAN, COT on 05/19/2024 12:32 PM.      ALLERGIES No Known Allergies  PAST MEDICAL HISTORY Past Medical History:  Diagnosis Date   Cataract  Mixed form OS   Chronic kidney disease    Diabetes mellitus without complication (HCC)    diagnosed at age 25   Eye problems    Heart murmur 1996   High cholesterol    patient denies but take preventative medicine   Hypertension    Hypertensive retinopathy    OU   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   Sleep apnea    on BiPAP   Stroke (HCC) 2016   Past Surgical History:  Procedure Laterality Date   ANTERIOR APPROACH HEMI HIP ARTHROPLASTY Left 01/01/2019   Procedure: ANTERIOR APPROACH total hip ARTHROPLASTY;  Surgeon: Fidel Rogue, MD;  Location:  [facility] OR;  Service: Orthopedics;  Laterality: Left;   CATARACT EXTRACTION Right 1994   CATARACT EXTRACTION Left 11/18/2023   CATARACT EXTRACTION W/ INTRAOCULAR LENS IMPLANT  1994   EYE SURGERY Right 1991   vitrectomy   EYE SURGERY Right 1992   scar tissue removed from retina   EYE SURGERY Right 1994   Cat Sx   FAMILY HISTORY Family History  Problem Relation Age of Onset   Hypertension Mother    Hyperlipidemia Mother    Hyperlipidemia Father    Hypertension Father    Diabetes Father    Kidney disease Father        had a kidney transplant    Stroke Brother    Diabetes Brother    Pulmonary fibrosis Paternal Grandmother    Lung cancer Paternal Grandfather    Diabetes Daughter    Colon cancer Neg Hx    Esophageal cancer Neg Hx    Rectal cancer Neg Hx    Stomach cancer Neg Hx    SOCIAL HISTORY Social History   Tobacco Use   Smoking status: Former    Current packs/day: 0.00    Average packs/day: 1 pack/day for 6.0 years (6.0 ttl pk-yrs)    Types: Cigarettes    Start date: 04/29/1985    Quit date: 04/30/1991    Years since quitting: 33.0   Smokeless tobacco: Never  Vaping Use   Vaping status: Never Used  Substance Use Topics   Alcohol use: No   Drug use: Never       OPHTHALMIC EXAM:  Base Eye Exam     Visual Acuity (Snellen - Linear)       Right Left   Dist cc 20/70 -2 20/30 +1   Dist ph cc 20/60 -1 20/25 -1    Correction: Glasses         Tonometry (Tonopen, 12:40 PM)       Right Left   Pressure 14 18         Pupils       Pupils Dark Light Shape React APD   Right PERRL 3 2 Round Brisk None   Left PERRL 3 2 Round Brisk None         Visual Fields       Left Right    Full Full         Extraocular Movement       Right Left    Full, Ortho Full, Ortho         Neuro/Psych     Oriented x3: Yes   Mood/Affect: Normal         Dilation     Both eyes: 1.0% Mydriacyl, 2.5% Phenylephrine  @ 12:41 PM           Slit Lamp and Fundus  Exam  Slit Lamp Exam       Right Left   Lids/Lashes Dermatochalasis - upper lid Dermatochalasis - upper lid   Conjunctiva/Sclera White and quiet White and quiet   Cornea fine endo pigment, focal band K at 3:00, well healed temporal cataract wounds, mild arcus, mild tear film debris tear film debris, fine endo pigment, Well healed temporal cataract wound   Anterior Chamber deep and clear deep, narrow angles   Iris Round, poorly dilated, no NVI round, moderately dilated, no NVI, mild anterior bowing   Lens PC IOL in good position with open PC PC IOL in good position, trace PCO   Anterior Vitreous post vitrectomy, vitreous condensations vitreous syneresis         Fundus Exam       Right Left   Disc +cupping, mild Pallor, fibrosis at 4:30, peripapillary CR scarring at 9:00, Sharp rim Pink and Sharp, trace fibrosis, Compact   C/D Ratio 0.7 0.2   Macula Flat, Blunted foveal reflex, Retinal pigment epithelial mottling, Atrophy, focal peripapillary pigment clump nasal macula, No heme or edema Flat, blunted foveal reflex, mild RPE mottling, trace Epiretinal membrane, No heme or edema   Vessels Severe attenuation, Tortuous, copper wiring attenuated, Tortuous   Periphery Attached, 360 PRP, No heme attached, 360 PRP to arcades, No heme           Refraction     Wearing Rx       Sphere Cylinder Axis Add   Right +1.50 +0.75 046 +2.25   Left +1.25 +1.00 033 +2.25    Age: >1year   Type: progressive           IMAGING AND PROCEDURES  Imaging and Procedures for @TODAY @  OCT, Retina - OU - Both Eyes       Right Eye Quality was good. Central Foveal Thickness: 202. Progression has been stable. Findings include normal foveal contour, no IRF, no SRF, retinal drusen , epiretinal membrane, outer retinal atrophy (Patchy ORA and diffuse retinal thinning, irregular lamination, no DME, mild ERM -- Stable from prior, persistent vitreous opacities).   Left Eye Quality was good. Central  Foveal Thickness: 297. Progression has been stable. Findings include normal foveal contour, no IRF, no SRF, epiretinal membrane, vitreomacular adhesion (no DME, irregular lamination).   Notes *Images captured and stored on drive  Diagnosis / Impression:  Hx of PDR OU No DME OU ERM OU (OD>OS) OD: Patchy ORA and diffuse retinal thinning, irregular lamination, no DME, mild ERM -- Stable from prior, persistent vitreous opacities OS: no DME, irregular lamination  Clinical management:  See below  Abbreviations: NFP - Normal foveal profile. CME - cystoid macular edema. PED - pigment epithelial detachment. IRF - intraretinal fluid. SRF - subretinal fluid. EZ - ellipsoid zone. ERM - epiretinal membrane. ORA - outer retinal atrophy. ORT - outer retinal tubulation. SRHM - subretinal hyper-reflective material      Fluorescein  Angiography Optos (Transit OD)       Right Eye Progression has been stable. Early phase findings include staining, window defect, vascular perfusion defect. Mid/Late phase findings include staining, window defect, vascular perfusion defect (Low fluorescein  signal).   Left Eye Progression has been stable. Early phase findings include staining, window defect. Mid/Late phase findings include staining, window defect, vascular perfusion defect (No leakage).   Notes Images stored on drive;   Impression: PDR w/ dense PRP  OU - no leakage or NV OU            ASSESSMENT/PLAN:  ICD-10-CM   1. 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 Diabetes mellitus treated with injections of non-insulin  medication (HCC)  E11.9    Z79.85     3. Essential hypertension  I10     4. Hypertensive retinopathy of both eyes  H35.033     5. Pseudophakia  Z96.1      1,2. DM1 w/ Proliferative diabetic retinopathy w/o  DME, OU  - last A1c: 6.7 (09.05.25), 6.8 on 09.08.22 - history of extensive retinal work done in [place], [place] in 90s per pt -- including PPV w/ laser OD - saw Dr. Alvia once in 2013 and then pt lost insurance and has not had regular eye care since - exam shows extensive PRP and regressed fibrosis/NV OD -- no significant edema  - good PRP in place OU  - FA (3.17.2021) shows scattered MA, but no active NV  - repeat FA 01.21.26 - no NV or leakage - OCT OD: Patchy ORA and diffuse retinal thinning, irregular lamination, no DME, mild ERM -- Stable from prior, persistent vitreous opacities, OS: no DME, irregular lamination - BCVA OD 20/70; OS 20/25 from 20/40 (s/p CEIOL in July 2025)  - OD likely affected by retinal ischemia vs ischemic optic atrophy   - discussed findings and prognosis  - no intervention indicated at this time  - recommend monitoring for now  - f/u in 1 year, sooner prn -- DFE/OCT  3,4. Hypertensive retinopathy OU  - discussed importance of tight BP control  - monitor  5. Pseudophakia OU  - s/p CE/IOL OD by [facility] [phone]  - s/p CE/IOL OS - Dr. Marcey, 07.2025  - doing well  - monitor   Ophthalmic Meds Ordered this visit:  No orders of the defined types were placed in this encounter.    Return in about 1 year (around 05/19/2025) for f/u, DM exam, DFE, OCT.  There are no Patient Instructions on file for this visit.  This document serves as a record of services personally performed by Redell JUDITHANN Hans, MD, PhD. It was created on their behalf by Almetta Pesa, an ophthalmic technician. The creation of this record is the provider's dictation and/or activities during the visit.    Electronically signed by: Almetta Pesa, OA, 05/19/24  10:12 PM  This document serves as a record of services personally performed by Redell JUDITHANN Hans, MD, PhD. It was created on their behalf by Wanda GEANNIE Keens, COT an ophthalmic technician. The creation of this record  is the provider's dictation and/or activities during the visit.    Electronically signed by:  Wanda GEANNIE Keens, COT  05/19/24 10:12 PM  Redell JUDITHANN Hans, M.D., Ph.D. Diseases & Surgery of the Retina and Vitreous [facility]  I have reviewed the above documentation for accuracy and completeness, and I agree with the above. Redell JUDITHANN Hans, M.D., Ph.D. 05/19/24 10:15 PM    Abbreviations: M myopia (nearsighted); A astigmatism; H hyperopia (farsighted); P presbyopia; Mrx spectacle prescription;  CTL contact lenses; OD right eye; OS left eye; OU both eyes  XT exotropia; ET esotropia; PEK punctate epithelial keratitis; PEE punctate epithelial erosions; DES dry eye syndrome; MGD meibomian gland dysfunction; ATs artificial tears; PFAT's preservative free artificial tears; NSC nuclear sclerotic cataract; PSC posterior subcapsular cataract; ERM epi-retinal membrane; PVD posterior  vitreous detachment; RD retinal detachment; DM diabetes mellitus; DR diabetic retinopathy; NPDR non-proliferative diabetic retinopathy; PDR proliferative diabetic retinopathy; CSME clinically significant macular edema; DME diabetic macular edema; dbh dot blot hemorrhages; CWS cotton wool spot; POAG primary open angle glaucoma; C/D cup-to-disc ratio; HVF humphrey visual field; GVF goldmann visual field; OCT optical coherence tomography; IOP intraocular pressure; BRVO Branch retinal vein occlusion; CRVO central retinal vein occlusion; CRAO central retinal artery occlusion; BRAO branch retinal artery occlusion; RT retinal tear; SB scleral buckle; PPV pars plana vitrectomy; VH Vitreous hemorrhage; PRP panretinal laser photocoagulation; IVK intravitreal kenalog; VMT vitreomacular traction; MH Macular hole;  NVD neovascularization of the disc; NVE neovascularization elsewhere; AREDS age related eye disease study; ARMD age related macular degeneration; POAG primary open angle glaucoma; EBMD epithelial/anterior basement  membrane dystrophy; ACIOL anterior chamber intraocular lens; IOL intraocular lens; PCIOL posterior chamber intraocular lens; Phaco/IOL phacoemulsification with intraocular lens placement; PRK photorefractive keratectomy; LASIK laser assisted in situ keratomileusis; HTN hypertension; DM diabetes mellitus; COPD chronic obstructive pulmonary disease "

## 2024-05-18 ENCOUNTER — Telehealth: Payer: Self-pay | Admitting: Internal Medicine

## 2024-05-18 NOTE — Telephone Encounter (Signed)
Patient dropped off patient assistance paperwork.  This was placed in the providers in box at the front desk.  Company is - [employer] ? ?

## 2024-05-19 ENCOUNTER — Ambulatory Visit (INDEPENDENT_AMBULATORY_CARE_PROVIDER_SITE_OTHER): Admitting: Ophthalmology

## 2024-05-19 ENCOUNTER — Encounter (INDEPENDENT_AMBULATORY_CARE_PROVIDER_SITE_OTHER): Payer: Self-pay | Admitting: Ophthalmology

## 2024-05-19 VITALS — BP 138/81 | HR 78

## 2024-05-19 DIAGNOSIS — I10 Essential (primary) hypertension: Secondary | ICD-10-CM

## 2024-05-19 DIAGNOSIS — Z961 Presence of intraocular lens: Secondary | ICD-10-CM

## 2024-05-19 DIAGNOSIS — Z7985 Long-term (current) use of injectable non-insulin antidiabetic drugs: Secondary | ICD-10-CM

## 2024-05-19 DIAGNOSIS — E103593 Type 1 diabetes mellitus with proliferative diabetic retinopathy without macular edema, bilateral: Secondary | ICD-10-CM

## 2024-05-19 DIAGNOSIS — H35033 Hypertensive retinopathy, bilateral: Secondary | ICD-10-CM | POA: Diagnosis not present

## 2024-05-19 DIAGNOSIS — H25812 Combined forms of age-related cataract, left eye: Secondary | ICD-10-CM

## 2024-05-20 ENCOUNTER — Other Ambulatory Visit: Payer: Self-pay | Admitting: Internal Medicine

## 2024-06-04 ENCOUNTER — Telehealth: Payer: Self-pay | Admitting: Internal Medicine

## 2024-06-04 MED ORDER — INSULIN LISPRO 100 UNIT/ML IJ SOLN: INTRAMUSCULAR | 3 refills | Status: AC

## 2024-06-04 NOTE — Telephone Encounter (Signed)
 Patient came in to office today and brought a [employer] Patient Assistance Program Application to be completed.  The application is in Dr. Kris folder in the front  office.  Patient would like to know when the application has been faxed.

## 2024-07-02 ENCOUNTER — Ambulatory Visit: Admitting: Internal Medicine

## 2024-08-13 ENCOUNTER — Encounter: Admitting: Family Medicine

## 2024-11-26 ENCOUNTER — Ambulatory Visit

## 2025-05-27 ENCOUNTER — Encounter (INDEPENDENT_AMBULATORY_CARE_PROVIDER_SITE_OTHER): Admitting: Ophthalmology
